# Patient Record
Sex: Male | Born: 1950 | Race: White | Hispanic: No | Smoking: Former smoker
Health system: Southern US, Community
[De-identification: ages and names within clinical notes are randomized; demographics above are authoritative.]

## PROBLEM LIST (undated history)

## (undated) DIAGNOSIS — C801 Malignant (primary) neoplasm, unspecified: Secondary | ICD-10-CM

## (undated) DIAGNOSIS — R000 Tachycardia, unspecified: Secondary | ICD-10-CM

## (undated) DIAGNOSIS — K852 Alcohol induced acute pancreatitis without necrosis or infection: Secondary | ICD-10-CM

## (undated) DIAGNOSIS — B159 Hepatitis A without hepatic coma: Secondary | ICD-10-CM

## (undated) DIAGNOSIS — M009 Pyogenic arthritis, unspecified: Secondary | ICD-10-CM

## (undated) DIAGNOSIS — N179 Acute kidney failure, unspecified: Secondary | ICD-10-CM

## (undated) DIAGNOSIS — F419 Anxiety disorder, unspecified: Secondary | ICD-10-CM

## (undated) DIAGNOSIS — M545 Low back pain, unspecified: Secondary | ICD-10-CM

## (undated) DIAGNOSIS — Z8489 Family history of other specified conditions: Secondary | ICD-10-CM

## (undated) DIAGNOSIS — I10 Essential (primary) hypertension: Secondary | ICD-10-CM

## (undated) DIAGNOSIS — F329 Major depressive disorder, single episode, unspecified: Secondary | ICD-10-CM

## (undated) DIAGNOSIS — K4090 Unilateral inguinal hernia, without obstruction or gangrene, not specified as recurrent: Secondary | ICD-10-CM

## (undated) DIAGNOSIS — K219 Gastro-esophageal reflux disease without esophagitis: Secondary | ICD-10-CM

## (undated) DIAGNOSIS — M1990 Unspecified osteoarthritis, unspecified site: Secondary | ICD-10-CM

## (undated) DIAGNOSIS — B1910 Unspecified viral hepatitis B without hepatic coma: Secondary | ICD-10-CM

## (undated) DIAGNOSIS — B1920 Unspecified viral hepatitis C without hepatic coma: Secondary | ICD-10-CM

## (undated) DIAGNOSIS — F32A Depression, unspecified: Secondary | ICD-10-CM

## (undated) DIAGNOSIS — E039 Hypothyroidism, unspecified: Secondary | ICD-10-CM

## (undated) DIAGNOSIS — R569 Unspecified convulsions: Secondary | ICD-10-CM

## (undated) DIAGNOSIS — R011 Cardiac murmur, unspecified: Secondary | ICD-10-CM

## (undated) DIAGNOSIS — G4730 Sleep apnea, unspecified: Secondary | ICD-10-CM

## (undated) DIAGNOSIS — J189 Pneumonia, unspecified organism: Secondary | ICD-10-CM

## (undated) HISTORY — PX: REFRACTIVE SURGERY: SHX103

## (undated) HISTORY — PX: OTHER SURGICAL HISTORY: SHX169

## (undated) HISTORY — DX: Essential (primary) hypertension: I10

## (undated) HISTORY — DX: Hepatitis a without hepatic coma: B15.9

## (undated) HISTORY — DX: Unspecified osteoarthritis, unspecified site: M19.90

## (undated) HISTORY — PX: ELBOW SURGERY: SHX618

## (undated) HISTORY — PX: EYE SURGERY: SHX253

## (undated) HISTORY — PX: KNEE SURGERY: SHX244

## (undated) HISTORY — PX: SHOULDER SURGERY: SHX246

---

## 2006-09-10 ENCOUNTER — Encounter: Admission: RE | Admit: 2006-09-10 | Discharge: 2006-09-10 | Payer: Self-pay | Admitting: Family Medicine

## 2007-06-13 ENCOUNTER — Encounter: Admission: RE | Admit: 2007-06-13 | Discharge: 2007-06-13 | Payer: Self-pay | Admitting: Family Medicine

## 2008-12-03 ENCOUNTER — Encounter
Admission: RE | Admit: 2008-12-03 | Discharge: 2008-12-03 | Payer: Self-pay | Admitting: Physical Medicine and Rehabilitation

## 2010-03-17 ENCOUNTER — Encounter: Payer: Self-pay | Admitting: Physical Medicine and Rehabilitation

## 2010-09-03 ENCOUNTER — Encounter (INDEPENDENT_AMBULATORY_CARE_PROVIDER_SITE_OTHER): Payer: Self-pay | Admitting: Surgery

## 2010-09-03 ENCOUNTER — Ambulatory Visit (INDEPENDENT_AMBULATORY_CARE_PROVIDER_SITE_OTHER): Payer: BC Managed Care – PPO | Admitting: Surgery

## 2010-09-03 VITALS — BP 120/78 | HR 72 | Temp 96.6°F | Ht 71.5 in | Wt 205.0 lb

## 2010-09-03 DIAGNOSIS — K648 Other hemorrhoids: Secondary | ICD-10-CM

## 2010-09-03 MED ORDER — OXYCODONE-ACETAMINOPHEN 7.5-325 MG PO TABS: 1.0000 | ORAL_TABLET | ORAL | Status: DC | PRN

## 2010-09-03 NOTE — Progress Notes (Signed)
Subjective:     Patient ID: Juan Wiggins, male   DOB: 02-Aug-1950, 60 y.o.   MRN: [id number]    BP 120/78  Pulse 72  Temp(Src) 96.6 F (35.9 C) (Temporal)  Ht 5' 11.5" (1.816 m)  Wt 205 lb (92.987 kg)  BMI 28.19 kg/m2    HPIThis patient presents to our urgent office with hemorrhoids. He was seen in an emergency department near the [facility] yesterday and told he needed an urgent hemorrhoidectomy. He lives here so came to our office for evaluation.  He notes that he had some hemorrhoid problems many many years ago but has been otherwise completely asymptomatic until yesterday. He had severe pain developed with some bleeding and prolapsing of the hemorrhoids. He has been on some Vicodin for pain which he normally takes for back issues. That really hasn't helped much. He was given some hemorrhoidal cream at the emergency department, but that hasn't helped much either.   Review of Systems  HENT: Negative.   Eyes: Negative.   Respiratory: Negative.   Cardiovascular:       Hypertension  Genitourinary: Negative.   Musculoskeletal: Positive for back pain.  Neurological: Negative.   Hematological: Negative.   Psychiatric/Behavioral: Negative.        Objective:   Physical Exam  Constitutional: He appears well-developed and well-nourished. He appears distressed.  HENT:  Head: Normocephalic and atraumatic.  Eyes: Pupils are equal, round, and reactive to light.  Neck: Neck supple. No tracheal deviation present. No thyromegaly present.  Cardiovascular: Normal rate, regular rhythm and normal heart sounds.   Pulmonary/Chest: Effort normal and breath sounds normal. No respiratory distress. He has no wheezes.  Abdominal: Soft. Bowel sounds are normal. He exhibits no distension. There is no tenderness.       Rectal examination.shows stage IV prolapse hemorrhoids. there are three significant columns of hemorrhoids One of them is becoming gangrenous  Neurological: He is alert. He has normal reflexes.    Skin: Skin is warm and dry.   No Known Allergies Current Outpatient Prescriptions  Medication Sig Dispense Refill  . ALPRAZolam (XANAX XR) 0.5 MG 24 hr tablet Take 5 mg by mouth every morning.        . escitalopram (LEXAPRO) 10 MG tablet Take 10 mg by mouth daily.        . hydrochlorothiazide (,MICROZIDE/HYDRODIURIL,) 12.5 MG capsule Take 12.5 mg by mouth daily.        Marland Kitchen HYDROcodone-acetaminophen (VICODIN) 5-500 MG per tablet Take 1 tablet by mouth as needed.        Marland Kitchen lisinopril (PRINIVIL,ZESTRIL) 5 MG tablet Take 5 mg by mouth daily.        Marland Kitchen oxyCODONE (OXYCONTIN) 15 MG TB12 Take 15 mg by mouth every 12 (twelve) hours.        . Zolpidem Tartrate (AMBIEN PO) Take by mouth Nightly.        Marland Kitchen oxyCODONE-acetaminophen (PERCOCET) 7.5-325 MG per tablet Take 1 tablet by mouth every 4 (four) hours as needed for pain.  40 tablet  0   Past Surgical History  Procedure Date  . Knee surgery   . Shoulder surgery   . Elbow surgery    Past Medical History  Diagnosis Date  . Hepatitis A   . Hypertension   . Thyroid disease   . Hemorrhoids   . Arthritis    History  Substance Use Topics  . Smoking status: Never Smoker   . Smokeless tobacco: Not on file  . Alcohol Use: Yes  3 - 4 per day       Assessment:     Acute stage IV prolapsed hemorrhoids with at least one thrombosed.    Plan:     I think he will need urgent hemorrhoidectomy and proctoscopy. I have discussed that with him. My associate, Dr. Claud Kelp also saw him. He agrees . We will add him on tomorrow's operative schedule for Dr. Derrell Lolling to perform proctoscopy and hemorrhoidectomy. I gave him a prescription for Percocet 7/325 #40

## 2010-09-04 ENCOUNTER — Ambulatory Visit (HOSPITAL_COMMUNITY): Payer: BC Managed Care – PPO

## 2010-09-04 ENCOUNTER — Other Ambulatory Visit (INDEPENDENT_AMBULATORY_CARE_PROVIDER_SITE_OTHER): Payer: Self-pay | Admitting: General Surgery

## 2010-09-04 ENCOUNTER — Ambulatory Visit (HOSPITAL_COMMUNITY)
Admission: RE | Admit: 2010-09-04 | Discharge: 2010-09-05 | Disposition: A | Discharge status: Home / Self Care | Payer: BC Managed Care – PPO | Source: Ambulatory Visit | Attending: General Surgery | Admitting: General Surgery

## 2010-09-04 DIAGNOSIS — K649 Unspecified hemorrhoids: Secondary | ICD-10-CM

## 2010-09-04 DIAGNOSIS — Z01810 Encounter for preprocedural cardiovascular examination: Secondary | ICD-10-CM | POA: Insufficient documentation

## 2010-09-04 DIAGNOSIS — I10 Essential (primary) hypertension: Secondary | ICD-10-CM | POA: Insufficient documentation

## 2010-09-04 DIAGNOSIS — K645 Perianal venous thrombosis: Secondary | ICD-10-CM | POA: Insufficient documentation

## 2010-09-04 DIAGNOSIS — Z01818 Encounter for other preprocedural examination: Secondary | ICD-10-CM | POA: Insufficient documentation

## 2010-09-04 DIAGNOSIS — Z01812 Encounter for preprocedural laboratory examination: Secondary | ICD-10-CM | POA: Insufficient documentation

## 2010-09-07 NOTE — Op Note (Signed)
NAME:  Juan Wiggins, Juan Wiggins NO.:  [id number]  MEDICAL RECORD NO.:  [id number]  LOCATION:  5120                         FACILITY:  [facility]  PHYSICIAN:  Angelia Mould. Derrell Lolling, M.D.DATE OF BIRTH:  [birth year]  DATE OF PROCEDURE:  09/04/2010 DATE OF DISCHARGE:                              OPERATIVE REPORT   PREOPERATIVE DIAGNOSIS:  Stage IV thrombosed internal and external hemorrhoids.  POSTOPERATIVE DIAGNOSIS:  Stage IV thrombosed internal and external hemorrhoids.  OPERATION PERFORMED: 1. Rigid proctoscopy. 2. Internal and external hemorrhoidectomy, complex, right posterior,     single column.  SURGEON:  Angelia Mould. Derrell Lolling, MD  OPERATIVE INDICATIONS:  This is a 60 year old Caucasian man who has had hemorrhoids off and on for 30 years.  He was seen in our urgent office by Dr. Cyndia Bent yesterday with swollen, prolapsed, and nonreducible hemorrhoids.  Dr. Jamey Ripa asked me to assume his care and proceed with surgery today.  I did briefly examine him in the office yesterday and found that he had huge prolapsed, nonreducible, and thrombosed internal/external hemorrhoids.  He was too tender for any kind of internal exam.  He is brought to the operating room urgently for examination and excision.  He states that he has had one colonoscopy in the past which was normal.  OPERATIVE TECHNIQUE:  Following the induction of general endotracheal anesthesia, the patient was placed in rigid stirrups in lithotomy position.    Examination revealed huge internal and external hemorrhoids which were thrombosed in the right posterior position.  There were minimal hemorrhoids elsewhere and so, I decided to simply excise the right posterior column.  Rigid proctoscopy was carried out.  The prep was not completely normal, but was fairly good.  I could get the proctoscope in up to 20 cm and saw no tumors.  There were several spots of stool, so I could have missed a small polyp, but no  large masses.  The perineal and perianal areas were then prepped and draped in a sterile fashion.  Surgical time-out was held, identifying correct patient and correct procedure.  I injected the perianal sphincter muscles with about 25 mL of 0.5% Marcaine with epinephrine.  I then mixed 1 mL of Wydase with 9 mL of 0.5 Marcaine with epinephrine and I injected the right posterior hemorrhoidal column.  This was massaged for 2 or 3 minutes.  The operating anoscope was then inserted.  Using a Pennington retractor, I grabbed the external component and this provided good exposure.  A figure-of-eight transfixion suture of 2-0 chromic was placed in the rectal mucosa in the right posterior position above the hemorrhoidal pile.  I then completely excised the internal and external component.  I very conservatively excised the mucosa and anoderm and used cautery and scissor dissection to excise the hemorrhoidal plexus being careful not to injure the internal sphincter muscle.  Specimen was sent to pathology.  Hemostasis was excellent and achieved electrocautery.  The wound was closed with a running locking suture of 2-0 chromic.  There was pressure held on this and simply observed for about 5 minutes and there was no bleeding or swelling.  I placed dibucaine ointment on the suture line.  External dry bandages were placed and fishnet panties were placed.  The patient tolerated the procedure well and was taken to the recovery room in stable condition.  Estimated blood loss was about 20 mL or less.  Complications none.  Sponge, needle, and instrument counts were correct.     Angelia Mould. Derrell Lolling, M.D.     HMI/MEDQ  D:  09/04/2010  T:  09/04/2010  Job:  [id number]  cc:   Joycelyn Rua, M.D.  Electronically Signed by Claud Kelp M.D. on 09/07/2010 09:51:43 PM

## 2010-09-09 ENCOUNTER — Telehealth (INDEPENDENT_AMBULATORY_CARE_PROVIDER_SITE_OTHER): Payer: Self-pay | Admitting: General Surgery

## 2010-09-10 LAB — POCT I-STAT 4, (NA,K, GLUC, HGB,HCT)
Hemoglobin: 16 g/dL (ref 13.0–17.0)
Potassium: 4.2 mEq/L (ref 3.5–5.1)

## 2010-10-06 ENCOUNTER — Encounter (INDEPENDENT_AMBULATORY_CARE_PROVIDER_SITE_OTHER): Payer: BC Managed Care – PPO | Admitting: General Surgery

## 2010-12-30 ENCOUNTER — Other Ambulatory Visit: Payer: Self-pay | Admitting: Family Medicine

## 2010-12-30 DIAGNOSIS — M542 Cervicalgia: Secondary | ICD-10-CM

## 2011-01-01 ENCOUNTER — Other Ambulatory Visit: Payer: Self-pay

## 2011-01-02 ENCOUNTER — Ambulatory Visit
Admission: RE | Admit: 2011-01-02 | Discharge: 2011-01-02 | Disposition: A | Discharge status: Home / Self Care | Payer: BC Managed Care – PPO | Source: Ambulatory Visit | Attending: Family Medicine | Admitting: Family Medicine

## 2011-01-02 DIAGNOSIS — M542 Cervicalgia: Secondary | ICD-10-CM

## 2011-01-05 ENCOUNTER — Encounter (INDEPENDENT_AMBULATORY_CARE_PROVIDER_SITE_OTHER): Payer: Self-pay | Admitting: General Surgery

## 2011-01-31 ENCOUNTER — Inpatient Hospital Stay: Admission: RE | Admit: 2011-01-31 | Payer: Self-pay | Source: Ambulatory Visit

## 2011-02-12 ENCOUNTER — Ambulatory Visit
Admission: RE | Admit: 2011-02-12 | Discharge: 2011-02-12 | Disposition: A | Discharge status: Home / Self Care | Payer: BC Managed Care – PPO | Source: Ambulatory Visit | Attending: Family Medicine | Admitting: Family Medicine

## 2011-02-12 ENCOUNTER — Other Ambulatory Visit: Payer: Self-pay | Admitting: Family Medicine

## 2011-02-12 DIAGNOSIS — R52 Pain, unspecified: Secondary | ICD-10-CM

## 2011-03-30 ENCOUNTER — Other Ambulatory Visit: Payer: Self-pay | Admitting: Neurosurgery

## 2011-03-30 ENCOUNTER — Other Ambulatory Visit (HOSPITAL_COMMUNITY): Payer: Self-pay | Admitting: Neurosurgery

## 2011-03-30 DIAGNOSIS — M542 Cervicalgia: Secondary | ICD-10-CM

## 2011-03-30 DIAGNOSIS — M545 Low back pain: Secondary | ICD-10-CM

## 2011-04-08 ENCOUNTER — Other Ambulatory Visit: Payer: Self-pay | Admitting: Neurosurgery

## 2011-04-09 ENCOUNTER — Inpatient Hospital Stay (HOSPITAL_COMMUNITY): Admission: RE | Admit: 2011-04-09 | Payer: Self-pay | Source: Ambulatory Visit

## 2011-04-09 ENCOUNTER — Ambulatory Visit (HOSPITAL_COMMUNITY): Admission: RE | Admit: 2011-04-09 | Payer: BC Managed Care – PPO | Source: Ambulatory Visit

## 2011-04-09 ENCOUNTER — Other Ambulatory Visit (HOSPITAL_COMMUNITY): Payer: Self-pay

## 2011-04-12 ENCOUNTER — Encounter (HOSPITAL_BASED_OUTPATIENT_CLINIC_OR_DEPARTMENT_OTHER): Payer: Self-pay | Admitting: *Deleted

## 2011-04-12 ENCOUNTER — Emergency Department (HOSPITAL_BASED_OUTPATIENT_CLINIC_OR_DEPARTMENT_OTHER): Payer: BC Managed Care – PPO

## 2011-04-12 ENCOUNTER — Other Ambulatory Visit: Payer: Self-pay

## 2011-04-12 ENCOUNTER — Emergency Department (INDEPENDENT_AMBULATORY_CARE_PROVIDER_SITE_OTHER): Payer: BC Managed Care – PPO

## 2011-04-12 ENCOUNTER — Observation Stay (HOSPITAL_BASED_OUTPATIENT_CLINIC_OR_DEPARTMENT_OTHER)
Admission: EM | Admit: 2011-04-12 | Discharge: 2011-04-15 | Disposition: A | Discharge status: Home / Self Care | Payer: BC Managed Care – PPO | Attending: Internal Medicine | Admitting: Internal Medicine

## 2011-04-12 DIAGNOSIS — F1010 Alcohol abuse, uncomplicated: Secondary | ICD-10-CM | POA: Diagnosis present

## 2011-04-12 DIAGNOSIS — E039 Hypothyroidism, unspecified: Secondary | ICD-10-CM | POA: Diagnosis present

## 2011-04-12 DIAGNOSIS — N179 Acute kidney failure, unspecified: Secondary | ICD-10-CM | POA: Insufficient documentation

## 2011-04-12 DIAGNOSIS — J449 Chronic obstructive pulmonary disease, unspecified: Secondary | ICD-10-CM | POA: Insufficient documentation

## 2011-04-12 DIAGNOSIS — M6281 Muscle weakness (generalized): Secondary | ICD-10-CM | POA: Insufficient documentation

## 2011-04-12 DIAGNOSIS — I959 Hypotension, unspecified: Principal | ICD-10-CM | POA: Insufficient documentation

## 2011-04-12 DIAGNOSIS — B159 Hepatitis A without hepatic coma: Secondary | ICD-10-CM | POA: Insufficient documentation

## 2011-04-12 DIAGNOSIS — J4489 Other specified chronic obstructive pulmonary disease: Secondary | ICD-10-CM | POA: Insufficient documentation

## 2011-04-12 DIAGNOSIS — R0602 Shortness of breath: Secondary | ICD-10-CM | POA: Insufficient documentation

## 2011-04-12 DIAGNOSIS — R296 Repeated falls: Secondary | ICD-10-CM

## 2011-04-12 DIAGNOSIS — I10 Essential (primary) hypertension: Secondary | ICD-10-CM

## 2011-04-12 HISTORY — DX: Hypothyroidism, unspecified: E03.9

## 2011-04-12 LAB — HEPATIC FUNCTION PANEL
AST: 15 U/L (ref 0–37)
Albumin: 3.3 g/dL — ABNORMAL LOW (ref 3.5–5.2)

## 2011-04-12 LAB — CBC
HCT: 34.5 % — ABNORMAL LOW (ref 39.0–52.0)
Hemoglobin: 12 g/dL — ABNORMAL LOW (ref 13.0–17.0)
MCH: 30.5 pg (ref 26.0–34.0)
MCHC: 34.8 g/dL (ref 30.0–36.0)

## 2011-04-12 LAB — BASIC METABOLIC PANEL
CO2: 26 mEq/L (ref 19–32)
GFR calc non Af Amer: 27 mL/min — ABNORMAL LOW (ref >90)
Glucose, Bld: 94 mg/dL (ref 70–99)
Potassium: 4.2 mEq/L (ref 3.5–5.1)
Sodium: 134 mEq/L — ABNORMAL LOW (ref 135–145)

## 2011-04-12 LAB — DIFFERENTIAL
Basophils Relative: 0 % (ref 0–1)
Eosinophils Absolute: 0.5 10*3/uL (ref 0.0–0.7)
Eosinophils Relative: 4 % (ref 0–5)
Monocytes Absolute: 1.5 10*3/uL — ABNORMAL HIGH (ref 0.1–1.0)
Monocytes Relative: 13 % — ABNORMAL HIGH (ref 3–12)

## 2011-04-12 LAB — RAPID URINE DRUG SCREEN, HOSP PERFORMED
Barbiturates: NOT DETECTED
Tetrahydrocannabinol: NOT DETECTED

## 2011-04-12 MED ORDER — SODIUM CHLORIDE 0.9 % IV SOLN
INTRAVENOUS | Status: DC
  Administered 2011-04-12: 21:00:00 via INTRAVENOUS

## 2011-04-12 MED ORDER — SODIUM CHLORIDE 0.9 % IV BOLUS (SEPSIS)
2000.0000 mL | Freq: Once | INTRAVENOUS | Status: AC
  Administered 2011-04-12: 2000 mL via INTRAVENOUS

## 2011-04-12 NOTE — ED Provider Notes (Cosign Needed)
History     CSN: [id number]  Arrival date & time 04/12/11  1478   First MD Initiated Contact with Patient 04/12/11 2057      Chief Complaint  Patient presents with  . Hypotension    (Consider location/radiation/quality/duration/timing/severity/associated sxs/prior treatment) HPI Complains of generalized weakness lightheadedness and shortness of breath onset one or 2 minutes after taking out the trash 6 PM tonight. No treatment prior to coming here no chest pain no bowel pain no headache no fever no other associated symptoms treated by EMS with intravenous fluids partial relief presently still complains name of generalized weakness. No other associated symptoms. Family member reported at home at blood pressure taken was approximately 60 systolic. Past Medical History  Diagnosis Date  . Hepatitis A   . Hypertension   . Thyroid disease   . Hemorrhoids   . Arthritis    Alcoholic Past Surgical History  Procedure Date  . Knee surgery   . Shoulder surgery   . Elbow surgery     History reviewed. No pertinent family history.  History  Substance Use Topics  . Smoking status: Never Smoker   . Smokeless tobacco: Not on file  . Alcohol Use: Yes     3 - 4 per day   social history cigar smoker, heavy drinker, no illicit drug use    Review of Systems  Constitutional: Positive for fatigue.  HENT: Negative.   Respiratory: Positive for shortness of breath.   Cardiovascular: Negative.   Gastrointestinal: Negative.   Musculoskeletal: Negative.   Skin: Negative.   Neurological: Negative.   Hematological: Negative.   Psychiatric/Behavioral: Negative.   All other systems reviewed and are negative.    Allergies  Review of patient's allergies indicates no known allergies.  Home Medications   Current Outpatient Rx  Name Route Sig Dispense Refill  . ALPRAZOLAM ER 0.5 MG PO TB24 Oral Take 5 mg by mouth every morning.      Marland Kitchen ESCITALOPRAM OXALATE 10 MG PO TABS Oral Take 10 mg by  mouth daily.      Marland Kitchen GABAPENTIN 300 MG PO CAPS Oral Take 300 mg by mouth 3 (three) times daily.    Marland Kitchen HYDROCHLOROTHIAZIDE 25 MG PO TABS Oral Take 25 mg by mouth daily.    Marland Kitchen HYDROCODONE-ACETAMINOPHEN 5-500 MG PO TABS Oral Take 1 tablet by mouth every 4 (four) hours as needed. For pain    . LANSOPRAZOLE 15 MG PO CPDR Oral Take 15 mg by mouth daily.    Marland Kitchen LEVOTHYROXINE SODIUM 150 MCG PO TABS Oral Take 150 mcg by mouth daily.    Marland Kitchen LISINOPRIL 20 MG PO TABS Oral Take 20 mg by mouth daily.    Marland Kitchen NAPROXEN 500 MG PO TABS Oral Take 500 mg by mouth 2 (two) times daily with a meal.    . OXYCODONE HCL ER 15 MG PO TB12 Oral Take 15 mg by mouth every 6 (six) hours as needed. For pain    . PROMETHAZINE HCL 25 MG PO TABS Oral Take 25 mg by mouth every 6 (six) hours as needed. For nausea    . TESTOSTERONE CYPIONATE 200 MG/ML IM OIL Intramuscular Inject 800 mg into the muscle every 30 (thirty) days.    Marland Kitchen ZOLPIDEM TARTRATE 10 MG PO TABS Oral Take 10 mg by mouth at bedtime.      BP 102/60  Pulse 84  Temp(Src) 97.9 F (36.6 C) (Oral)  Resp 20  Ht 5\' 9"  (1.753 m)  Wt 180 lb (81.647  kg)  BMI 26.58 kg/m2  SpO2 98%  Physical Exam  Nursing note and vitals reviewed. Constitutional: He appears well-developed and well-nourished.  HENT:  Head: Normocephalic and atraumatic.  Eyes: Conjunctivae are normal. Pupils are equal, round, and reactive to light.  Neck: Neck supple. No tracheal deviation present. No thyromegaly present.  Cardiovascular: Normal rate and regular rhythm.   No murmur heard. Pulmonary/Chest: Effort normal and breath sounds normal.  Abdominal: Soft. Bowel sounds are normal. He exhibits no distension. There is no tenderness.  Musculoskeletal: Normal range of motion. He exhibits no edema and no tenderness.  Neurological: He is alert. Coordination normal.  Skin: Skin is warm and dry. No rash noted.  Psychiatric: He has a normal mood and affect.    ED Course  Procedures (including critical care  time) 12 midnight feels improved patient alert awake hemodynamically stable Labs Reviewed  CBC - Abnormal; Notable for the following:    WBC 11.4 (*)    RBC 3.93 (*)    Hemoglobin 12.0 (*)    HCT 34.5 (*)    All other components within normal limits  DIFFERENTIAL - Abnormal; Notable for the following:    Neutro Abs 8.1 (*)    Lymphocytes Relative 11 (*)    Monocytes Relative 13 (*)    Monocytes Absolute 1.5 (*)    All other components within normal limits  BASIC METABOLIC PANEL  ETHANOL   Date: 04/12/2011  Rate: 90  Rhythm: normal sinus rhythm  QRS Axis: normal  Intervals: normal  ST/T Wave abnormalities: normal and nonspecific ST/T changes  Conduction Disutrbances:none  Narrative Interpretation:   Old EKG Reviewed: none available  No results found.   No diagnosis found.    MDM  Hypotension may be due to polypharmacy i.e. benzodiazepines, narcotics, and alcohol Angina equivalent with dyspnea and hypotension after exerting himself by taking out trash is also a remote possibility. Patient to be hospitalized as 23 hour observation to regulate medications. Spoke with Dr.Kakrakandy who accepts patient in transfer Plan telemetry Transfer to [facility] Note patient should be watched for withdrawal from alcohol, narcotics and or benzodiazepines while in the hospital Diagnoses #1 hypotension #2 dyspnea #3 alcohol abuse #4 renal insufficiency        Doug Sou, MD 04/13/11 1610  Doug Sou, MD 04/13/11 0030

## 2011-04-12 NOTE — ED Notes (Signed)
I placed a call to [facility](via answering service line) at [facility] for consult per Dr. Ethelda Chick

## 2011-04-12 NOTE — ED Notes (Signed)
Pt reported + ETOH today of about "3-4 scotches"  Pt will arouse to answer questions and promptly falls back to sleep.  Pt family at bedside.

## 2011-04-12 NOTE — ED Notes (Signed)
Patient uses urinal without difficulty and with no assistance

## 2011-04-12 NOTE — ED Notes (Signed)
Pt presents to ED via [facility] for decreased BP at home.  Per EMS, pt BP enroute 88/58.  Pt in no visible signs of distress.

## 2011-04-13 ENCOUNTER — Encounter (HOSPITAL_COMMUNITY): Payer: Self-pay | Admitting: Internal Medicine

## 2011-04-13 DIAGNOSIS — I959 Hypotension, unspecified: Secondary | ICD-10-CM

## 2011-04-13 DIAGNOSIS — E039 Hypothyroidism, unspecified: Secondary | ICD-10-CM | POA: Diagnosis present

## 2011-04-13 DIAGNOSIS — N179 Acute kidney failure, unspecified: Secondary | ICD-10-CM | POA: Diagnosis present

## 2011-04-13 DIAGNOSIS — F1010 Alcohol abuse, uncomplicated: Secondary | ICD-10-CM | POA: Diagnosis present

## 2011-04-13 LAB — MRSA PCR SCREENING: MRSA by PCR: NEGATIVE

## 2011-04-13 LAB — URINALYSIS, ROUTINE W REFLEX MICROSCOPIC
Bilirubin Urine: NEGATIVE
Leukocytes, UA: NEGATIVE
Nitrite: NEGATIVE
Specific Gravity, Urine: 1.009 (ref 1.005–1.030)
pH: 6 (ref 5.0–8.0)

## 2011-04-13 LAB — COMPREHENSIVE METABOLIC PANEL
Albumin: 2.9 g/dL — ABNORMAL LOW (ref 3.5–5.2)
BUN: 18 mg/dL (ref 6–23)
Chloride: 102 mEq/L (ref 96–112)
Creatinine, Ser: 2.24 mg/dL — ABNORMAL HIGH (ref 0.50–1.35)
GFR calc Af Amer: 35 mL/min — ABNORMAL LOW (ref >90)
Total Bilirubin: 0.3 mg/dL (ref 0.3–1.2)

## 2011-04-13 LAB — CARDIAC PANEL(CRET KIN+CKTOT+MB+TROPI)
CK, MB: 2.1 ng/mL (ref 0.3–4.0)
Relative Index: INVALID (ref 0.0–2.5)
Relative Index: INVALID (ref 0.0–2.5)
Total CK: 49 U/L (ref 7–232)
Troponin I: <0.3 ng/mL (ref <0.30)
Troponin I: <0.3 ng/mL (ref <0.30)
Troponin I: <0.3 ng/mL (ref <0.30)

## 2011-04-13 LAB — CBC
MCV: 87.6 fL (ref 78.0–100.0)
Platelets: 339 10*3/uL (ref 150–400)
RBC: 3.79 MIL/uL — ABNORMAL LOW (ref 4.22–5.81)
WBC: 7.6 10*3/uL (ref 4.0–10.5)

## 2011-04-13 MED ORDER — ALPRAZOLAM ER 1 MG PO TB24: 5.0000 mg | ORAL_TABLET | ORAL | Status: DC

## 2011-04-13 MED ORDER — VITAMIN B-1 100 MG PO TABS
100.0000 mg | ORAL_TABLET | Freq: Every day | ORAL | Status: DC
  Administered 2011-04-13 – 2011-04-15 (×3): 100 mg via ORAL
  Filled 2011-04-13 (×3): qty 1

## 2011-04-13 MED ORDER — LORAZEPAM 0.5 MG PO TABS: 1.0000 mg | ORAL_TABLET | Freq: Four times a day (QID) | ORAL | Status: DC | PRN

## 2011-04-13 MED ORDER — LORAZEPAM 2 MG/ML IJ SOLN
1.0000 mg | Freq: Four times a day (QID) | INTRAMUSCULAR | Status: DC | PRN
  Administered 2011-04-13: 1 mg via INTRAVENOUS
  Filled 2011-04-13: qty 1

## 2011-04-13 MED ORDER — THIAMINE HCL 100 MG/ML IJ SOLN
100.0000 mg | Freq: Every day | INTRAMUSCULAR | Status: DC
  Filled 2011-04-13: qty 1

## 2011-04-13 MED ORDER — ADULT MULTIVITAMIN W/MINERALS CH
1.0000 | ORAL_TABLET | Freq: Every day | ORAL | Status: DC
  Filled 2011-04-13: qty 1

## 2011-04-13 MED ORDER — LEVOTHYROXINE SODIUM 150 MCG PO TABS
150.0000 ug | ORAL_TABLET | Freq: Every day | ORAL | Status: DC
  Administered 2011-04-13 – 2011-04-15 (×3): 150 ug via ORAL
  Filled 2011-04-13 (×3): qty 1

## 2011-04-13 MED ORDER — OXYCODONE HCL 15 MG PO TB12
15.0000 mg | ORAL_TABLET | Freq: Four times a day (QID) | ORAL | Status: DC | PRN
  Administered 2011-04-13 – 2011-04-15 (×6): 15 mg via ORAL
  Filled 2011-04-13 (×6): qty 1

## 2011-04-13 MED ORDER — SODIUM CHLORIDE 0.9 % IV SOLN
INTRAVENOUS | Status: DC
  Administered 2011-04-13 – 2011-04-15 (×5): via INTRAVENOUS

## 2011-04-13 MED ORDER — PANTOPRAZOLE SODIUM 40 MG PO TBEC
40.0000 mg | DELAYED_RELEASE_TABLET | Freq: Every day | ORAL | Status: DC
  Administered 2011-04-13 – 2011-04-15 (×3): 40 mg via ORAL
  Filled 2011-04-13 (×3): qty 1

## 2011-04-13 MED ORDER — LORAZEPAM 0.5 MG PO TABS
0.0000 mg | ORAL_TABLET | Freq: Four times a day (QID) | ORAL | Status: AC
  Administered 2011-04-14: 1 mg via ORAL
  Administered 2011-04-14 – 2011-04-15 (×2): 0.5 mg via ORAL
  Filled 2011-04-13: qty 2
  Filled 2011-04-13 (×2): qty 1

## 2011-04-13 MED ORDER — LORAZEPAM 2 MG/ML IJ SOLN
1.0000 mg | Freq: Four times a day (QID) | INTRAMUSCULAR | Status: DC | PRN
  Administered 2011-04-13: 1 mg via INTRAVENOUS
  Filled 2011-04-13 (×2): qty 1

## 2011-04-13 MED ORDER — ADULT MULTIVITAMIN W/MINERALS CH
1.0000 | ORAL_TABLET | Freq: Every day | ORAL | Status: DC
  Administered 2011-04-13 – 2011-04-15 (×3): 1 via ORAL
  Filled 2011-04-13 (×3): qty 1

## 2011-04-13 MED ORDER — GABAPENTIN 300 MG PO CAPS
300.0000 mg | ORAL_CAPSULE | Freq: Three times a day (TID) | ORAL | Status: DC
  Administered 2011-04-13 – 2011-04-15 (×7): 300 mg via ORAL
  Filled 2011-04-13 (×9): qty 1

## 2011-04-13 MED ORDER — VITAMIN B-1 100 MG PO TABS
100.0000 mg | ORAL_TABLET | Freq: Every day | ORAL | Status: DC
  Filled 2011-04-13: qty 1

## 2011-04-13 MED ORDER — THIAMINE HCL 100 MG/ML IJ SOLN
100.0000 mg | Freq: Every day | INTRAMUSCULAR | Status: DC
  Filled 2011-04-13 (×3): qty 1

## 2011-04-13 MED ORDER — ZOLPIDEM TARTRATE 5 MG PO TABS
10.0000 mg | ORAL_TABLET | Freq: Every day | ORAL | Status: DC
  Administered 2011-04-13 – 2011-04-14 (×2): 10 mg via ORAL
  Filled 2011-04-13 (×2): qty 2

## 2011-04-13 MED ORDER — FOLIC ACID 1 MG PO TABS
1.0000 mg | ORAL_TABLET | Freq: Every day | ORAL | Status: DC
  Administered 2011-04-13 – 2011-04-15 (×3): 1 mg via ORAL
  Filled 2011-04-13 (×3): qty 1

## 2011-04-13 MED ORDER — LORAZEPAM 0.5 MG PO TABS
0.0000 mg | ORAL_TABLET | Freq: Two times a day (BID) | ORAL | Status: DC
  Administered 2011-04-15: 0.5 mg via ORAL
  Filled 2011-04-13: qty 1

## 2011-04-13 MED ORDER — SODIUM CHLORIDE 0.9 % IV SOLN: INTRAVENOUS | Status: DC

## 2011-04-13 MED ORDER — ESCITALOPRAM OXALATE 10 MG PO TABS
10.0000 mg | ORAL_TABLET | Freq: Every day | ORAL | Status: DC
  Administered 2011-04-13 – 2011-04-15 (×3): 10 mg via ORAL
  Filled 2011-04-13 (×3): qty 1

## 2011-04-13 MED ORDER — HYDROCODONE-ACETAMINOPHEN 5-325 MG PO TABS
1.0000 | ORAL_TABLET | ORAL | Status: DC | PRN
  Administered 2011-04-15 (×2): 1 via ORAL
  Filled 2011-04-13 (×2): qty 1

## 2011-04-13 MED ORDER — FOLIC ACID 1 MG PO TABS
1.0000 mg | ORAL_TABLET | Freq: Every day | ORAL | Status: DC
  Filled 2011-04-13: qty 1

## 2011-04-13 MED ORDER — ONDANSETRON HCL 4 MG/2ML IJ SOLN: 4.0000 mg | Freq: Four times a day (QID) | INTRAMUSCULAR | Status: DC | PRN

## 2011-04-13 NOTE — Progress Notes (Signed)
Pt had orders that were placed prior to this admission(2/4) waiting to be released.  The orders did not seem to be for this pt.  Dr. Franky Macho was notified and did not know about the orders.  Dr. Ardyth Harps was notified as well, she also thought the orders were not for this pt.  The orders were released and DC.

## 2011-04-13 NOTE — H&P (Signed)
Juan Wiggins is an 61 y.o. male. PCP - Dr.Stephen Izola Price.   Chief Complaint: Weakness. HPI: 61 year-old male with history of hypothyroidism alcoholism and hypertension suddenly felt weak at home. His girlfriend checked his blood pressure and was found to be low. They called EMS. His blood pressure systolic was found around 70. Given normal saline bolus and taken to the ER med center [facility]. There his the pressure was still in the lower side but improved with fluids. In addition his creatinine was found to be 2.5. We don't have a baseline creatinine levels on him. At this time patient has been admitted for further management. Patient denies any nausea vomiting abdominal pain dysuria or discharges or diarrhea. Patient denies any chest pain shortness of breath or any focal deficits.  Past Medical History  Diagnosis Date  . Hepatitis A   . Hypertension   . Thyroid disease   . Hemorrhoids   . Arthritis   . Hypothyroidism     Past Surgical History  Procedure Date  . Knee surgery   . Shoulder surgery   . Elbow surgery     History reviewed. No pertinent family history. Social History:  reports that he has never smoked. He does not have any smokeless tobacco history on file. He reports that he drinks alcohol. He reports that he does not use illicit drugs.  Allergies: No Known Allergies  Medications Prior to Admission  Medication Dose Route Frequency Provider Last Rate Last Dose  . 0.9 %  sodium chloride infusion   Intravenous Continuous Eduard Clos, MD      . ALPRAZolam (XANAX XR) 24 hr tablet 5 mg  5 mg Oral Scharlene Gloss, MD      . escitalopram (LEXAPRO) tablet 10 mg  10 mg Oral Daily Eduard Clos, MD      . folic acid (FOLVITE) tablet 1 mg  1 mg Oral Daily Eduard Clos, MD      . gabapentin (NEURONTIN) capsule 300 mg  300 mg Oral TID Eduard Clos, MD      . HYDROcodone-acetaminophen (NORCO) 5-325 MG per tablet 1 tablet  1 tablet Oral Q4H  PRN Eduard Clos, MD      . levothyroxine (SYNTHROID, LEVOTHROID) tablet 150 mcg  150 mcg Oral Daily Eduard Clos, MD      . LORazepam (ATIVAN) tablet 1 mg  1 mg Oral Q6H PRN Eduard Clos, MD       Or  . LORazepam (ATIVAN) injection 1 mg  1 mg Intravenous Q6H PRN Eduard Clos, MD      . LORazepam (ATIVAN) tablet 0-4 mg  0-4 mg Oral Q6H Eduard Clos, MD       Followed by  . LORazepam (ATIVAN) tablet 0-4 mg  0-4 mg Oral Q12H Eduard Clos, MD      . mulitivitamin with minerals tablet 1 tablet  1 tablet Oral Daily Eduard Clos, MD      . ondansetron [facility]) injection 4 mg  4 mg Intravenous Q6H PRN Eduard Clos, MD      . oxyCODONE (OXYCONTIN) 12 hr tablet 15 mg  15 mg Oral Q6H PRN Eduard Clos, MD      . pantoprazole (PROTONIX) EC tablet 40 mg  40 mg Oral Daily Eduard Clos, MD      . sodium chloride 0.9 % bolus 2,000 mL  2,000 mL Intravenous Once Doug Sou, MD   2,000  mL at 04/12/11 2343  . thiamine (VITAMIN B-1) tablet 100 mg  100 mg Oral Daily Eduard Clos, MD       Or  . thiamine (B-1) injection 100 mg  100 mg Intravenous Daily Eduard Clos, MD      . zolpidem (AMBIEN) tablet 10 mg  10 mg Oral QHS Eduard Clos, MD      . DISCONTD: 0.9 %  sodium chloride infusion   Intravenous Continuous Doug Sou, MD      . DISCONTD: 0.9 %  sodium chloride infusion   Intravenous STAT Doug Sou, MD      . DISCONTD: folic acid (FOLVITE) tablet 1 mg  1 mg Oral Daily Doug Sou, MD      . DISCONTD: LORazepam (ATIVAN) injection 1 mg  1 mg Intravenous Q6H PRN Doug Sou, MD   1 mg at 04/13/11 0208  . DISCONTD: LORazepam (ATIVAN) tablet 1 mg  1 mg Oral Q6H PRN Doug Sou, MD      . DISCONTD: mulitivitamin with minerals tablet 1 tablet  1 tablet Oral Daily Doug Sou, MD      . DISCONTD: thiamine (B-1) injection 100 mg  100 mg Intravenous Daily Doug Sou, MD      . DISCONTD:  thiamine (VITAMIN B-1) tablet 100 mg  100 mg Oral Daily Doug Sou, MD       Medications Prior to Admission  Medication Sig Dispense Refill  . ALPRAZolam (XANAX XR) 0.5 MG 24 hr tablet Take 5 mg by mouth every morning.        . escitalopram (LEXAPRO) 10 MG tablet Take 10 mg by mouth daily.        Marland Kitchen HYDROcodone-acetaminophen (VICODIN) 5-500 MG per tablet Take 1 tablet by mouth every 4 (four) hours as needed. For pain      . oxyCODONE (OXYCONTIN) 15 MG TB12 Take 15 mg by mouth every 6 (six) hours as needed. For pain        Results for orders placed during the hospital encounter of 04/12/11 (from the past 48 hour(s))  CBC     Status: Abnormal   Collection Time   04/12/11  8:39 PM      Component Value Range Comment   WBC 11.4 (*) 4.0 - 10.5 (K/uL)    RBC 3.93 (*) 4.22 - 5.81 (MIL/uL)    Hemoglobin 12.0 (*) 13.0 - 17.0 (g/dL)    HCT 40.9 (*) 81.1 - 52.0 (%)    MCV 87.8  78.0 - 100.0 (fL)    MCH 30.5  26.0 - 34.0 (pg)    MCHC 34.8  30.0 - 36.0 (g/dL)    RDW 91.4  78.2 - 95.6 (%)    Platelets 364  150 - 400 (K/uL)   DIFFERENTIAL     Status: Abnormal   Collection Time   04/12/11  8:39 PM      Component Value Range Comment   Neutrophils Relative 72  43 - 77 (%)    Neutro Abs 8.1 (*) 1.7 - 7.7 (K/uL)    Lymphocytes Relative 11 (*) 12 - 46 (%)    Lymphs Abs 1.2  0.7 - 4.0 (K/uL)    Monocytes Relative 13 (*) 3 - 12 (%)    Monocytes Absolute 1.5 (*) 0.1 - 1.0 (K/uL)    Eosinophils Relative 4  0 - 5 (%)    Eosinophils Absolute 0.5  0.0 - 0.7 (K/uL)    Basophils Relative 0  0 - 1 (%)  Basophils Absolute 0.0  0.0 - 0.1 (K/uL)   TROPONIN I     Status: Normal   Collection Time   04/12/11  9:11 PM      Component Value Range Comment   Troponin I <0.30  <0.30 (ng/mL)   HEPATIC FUNCTION PANEL     Status: Abnormal   Collection Time   04/12/11  9:11 PM      Component Value Range Comment   Total Protein 6.5  6.0 - 8.3 (g/dL)    Albumin 3.3 (*) 3.5 - 5.2 (g/dL)    AST 15  0 - 37 (U/L)     ALT 11  0 - 53 (U/L)    Alkaline Phosphatase 71  39 - 117 (U/L)    Total Bilirubin 0.1 (*) 0.3 - 1.2 (mg/dL)    Bilirubin, Direct <1.6  0.0 - 0.3 (mg/dL)    Indirect Bilirubin NOT CALCULATED  0.3 - 0.9 (mg/dL)   ETHANOL     Status: Abnormal   Collection Time   04/12/11  9:11 PM      Component Value Range Comment   Alcohol, Ethyl (B) 31 (*) 0 - 11 (mg/dL)   BASIC METABOLIC PANEL     Status: Abnormal   Collection Time   04/12/11  9:11 PM      Component Value Range Comment   Sodium 134 (*) 135 - 145 (mEq/L)    Potassium 4.2  3.5 - 5.1 (mEq/L)    Chloride 97  96 - 112 (mEq/L)    CO2 26  19 - 32 (mEq/L)    Glucose, Bld 94  70 - 99 (mg/dL)    BUN 17  6 - 23 (mg/dL)    Creatinine, Ser 1.09 (*) 0.50 - 1.35 (mg/dL)    Calcium 9.2  8.4 - 10.5 (mg/dL)    GFR calc non Af Amer 27 (*) >90 (mL/min)    GFR calc Af Amer 31 (*) >90 (mL/min)   URINE RAPID DRUG SCREEN (HOSP PERFORMED)     Status: Abnormal   Collection Time   04/12/11  9:40 PM      Component Value Range Comment   Opiates NONE DETECTED  NONE DETECTED     Cocaine NONE DETECTED  NONE DETECTED     Benzodiazepines POSITIVE (*) NONE DETECTED     Amphetamines NONE DETECTED  NONE DETECTED     Tetrahydrocannabinol NONE DETECTED  NONE DETECTED     Barbiturates NONE DETECTED  NONE DETECTED     Dg Chest 2 View  04/12/2011  *RADIOLOGY REPORT*  Clinical Data: Hypotension.  CHEST - 2 VIEW  Comparison: None.  Findings: Normal sized heart.  Clear lungs.  Flattening of the hemidiaphragms.  Minimal central peribronchial thickening.  Mild dextroconvex scoliosis.  IMPRESSION: No acute abnormality.  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  Original Report Authenticated By: Darrol Angel, M.D.    Review of Systems  HENT: Negative.   Eyes: Negative.   Respiratory: Negative.   Cardiovascular: Negative.   Gastrointestinal: Negative.   Genitourinary: Negative.   Musculoskeletal: Negative.   Skin: Negative.   Neurological: Positive for  dizziness and weakness.  Endo/Heme/Allergies: Negative.   Psychiatric/Behavioral: Negative.     Blood pressure 115/77, pulse 81, temperature 98.6 F (37 C), temperature source Oral, resp. rate 18, height 5\' 11"  (1.803 m), weight 89.404 kg (197 lb 1.6 oz), SpO2 96.00%. Physical Exam  Constitutional: He is oriented to person, place, and time. He appears well-developed and well-nourished. No distress.  HENT:  Head: Normocephalic and atraumatic.  Right Ear: External ear normal.  Left Ear: External ear normal.  Nose: Nose normal.  Mouth/Throat: Oropharynx is clear and moist. No oropharyngeal exudate.  Eyes: Conjunctivae are normal. Pupils are equal, round, and reactive to light. Right eye exhibits no discharge. Left eye exhibits no discharge. No scleral icterus.  Neck: Normal range of motion. Neck supple.  Cardiovascular: Normal rate, regular rhythm and normal heart sounds.   Respiratory: Effort normal and breath sounds normal. No respiratory distress. He has no wheezes. He has no rales.  GI: Soft. Bowel sounds are normal. He exhibits no distension. There is no tenderness. There is no rebound.  Musculoskeletal: Normal range of motion. He exhibits no edema and no tenderness.  Neurological: He is alert and oriented to person, place, and time.       Moves upper and lower extremities 5/5.  Skin: Skin is warm and dry. No rash noted. He is not diaphoretic. No erythema.  Psychiatric: His behavior is normal.     Assessment/Plan #1. Hypotension - we do not know the exact cause. At this time we'll continue hydration and hold his antihypertensives for now. Blood Pressure is responding to fluids. He does not look septic. #2. Acute renal failure - probably from hypotension and diuretics and ACE inhibitors. We will hydrate and recheck his metabolic panel. Hold off antihypertensives for now. If his creatinine does not improve he'll need further studies including renal sonogram. Check urinalysis for  now. #3. History of alcoholism - place patient on alcohol withdrawal protocol. #4. History of hypothyroidism - check TSH. #5. History of anxiety and depression - currently present medications. #6. Chronic pain - continue present medications.  CODE STATUS - full code.  Naomy Esham N. 04/13/2011, 6:03 AM

## 2011-04-13 NOTE — ED Notes (Signed)
[facility] here for pt transport 

## 2011-04-13 NOTE — Progress Notes (Signed)
Patient admitted earlier today for complaint of weakness. Found to be hypotensive and with ARF and a Cr of 2.5. His BP meds have been held, is receiving IVF, and his CR is already down to 2.2. I suspect his ARF is from prerrenal azotemia and should improve with hydration. Given his complaint of weakness will ask PT to assess to make sure he is safe to return home upon DC.

## 2011-04-13 NOTE — ED Notes (Signed)
Pt given PO and tolerating well

## 2011-04-13 NOTE — Progress Notes (Signed)
Pt IV pump would not quit beeping due to occlusion when pt bends arm.  IV site was changed to [facility] and is infusing well.

## 2011-04-13 NOTE — ED Notes (Signed)
Family at bedside. Admission discussed and everyone ok with POC

## 2011-04-13 NOTE — Progress Notes (Signed)
Utilization Review Completed.Juan Wiggins T2/18/2013   

## 2011-04-14 DIAGNOSIS — R296 Repeated falls: Secondary | ICD-10-CM

## 2011-04-14 LAB — BASIC METABOLIC PANEL
CO2: 27 mEq/L (ref 19–32)
Chloride: 105 mEq/L (ref 96–112)
Glucose, Bld: 111 mg/dL — ABNORMAL HIGH (ref 70–99)
Potassium: 4.1 mEq/L (ref 3.5–5.1)
Sodium: 138 mEq/L (ref 135–145)

## 2011-04-14 LAB — CBC
HCT: 31.9 % — ABNORMAL LOW (ref 39.0–52.0)
Hemoglobin: 10.8 g/dL — ABNORMAL LOW (ref 13.0–17.0)
MCH: 30 pg (ref 26.0–34.0)
MCV: 88.6 fL (ref 78.0–100.0)
Platelets: 314 10*3/uL (ref 150–400)
RBC: 3.6 MIL/uL — ABNORMAL LOW (ref 4.22–5.81)
WBC: 4.8 10*3/uL (ref 4.0–10.5)

## 2011-04-14 LAB — RPR: RPR Ser Ql: NONREACTIVE

## 2011-04-14 NOTE — Evaluation (Signed)
Physical Therapy Evaluation  @@@D /C from PT due to no further immediate needs. Mobility Team to see while on 4700.  Consider neuro work up for spinal stenosis, etc.  Thank you.@@@   Patient Details Name: Juan Wiggins MRN: [id number] DOB: 10-13-1950 Today's Date: 04/14/2011  Problem List:  Patient Active Problem List  Diagnoses  . Prolapsed and thrombosed  . Hypotension  . ARF (acute renal failure)  . Hypothyroidism  . Alcohol abuse  . Recurrent falls    Past Medical History:  Past Medical History  Diagnosis Date  . Hepatitis A   . Hypertension   . Thyroid disease   . Hemorrhoids   . Arthritis   . Hypothyroidism    Past Surgical History:  Past Surgical History  Procedure Date  . Knee surgery   . Shoulder surgery   . Elbow surgery     PT Assessment/Plan/Recommendation PT Assessment Clinical Impression Statement: pt is a 61 y/o male admitted with progressive weakness and low BP witnessed by pt's girlfriend.  BP's are improving,b ut the pain and weakness continue.  Not further PT needs, but will have mobility team come see pt and keep mobile.  Consider further neurological testing for potential spinal stenosis. PT Recommendation/Assessment: Patent does not need any further PT services No Skilled PT: Patient at baseline level of functioning;Patient will have necessary level of assist by caregiver at discharge;Patient is independent with all acitivity/mobility PT Recommendation Follow Up Recommendations: No PT follow up Equipment Recommended: None recommended by PT PT Goals     PT Evaluation Precautions/Restrictions  Precautions Precautions: Fall;Other (comment) (mild risk) Prior Functioning  Home Living Lives With: Other (Comment) (father, caregive and girl friend) Type of Home: House Home Layout: Two level;Able to live on main level with bedroom/bathroom Alternate Level Stairs-Rails: Right;Left Alternate Level Stairs-Number of Steps: >12 Home Access: Level  entry Bathroom Shower/Tub: Engineer, manufacturing systems: Handicapped height Prior Function Level of Independence: Independent with basic ADLs;Independent with homemaking with ambulation;Independent with gait;Independent with transfers Able to Take Stairs?: Yes Driving: Yes 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 Arousal/Alertness: Awake/alert Overall Cognitive Status: Appears within functional limits for tasks assessed Sensation/Coordination Sensation Light Touch: Impaired by gross assessment (though numbness at great toes and finger tips) Coordination Gross Motor Movements are Fluid and Coordinated: Yes Fine Motor Movements are Fluid and Coordinated: Not tested Extremity Assessment RUE Assessment RUE Assessment: Within Functional Limits LUE Assessment LUE Assessment: Within Functional Limits (significant weakness due to pain with effort Bil) RLE Assessment RLE Assessment: Within Functional Limits LLE Assessment LLE Assessment: Within Functional Limits (significant weakness due to pain--L worse then R) Mobility (including Balance) Bed Mobility Bed Mobility: Yes Supine to Sit: 7: Independent Sit to Supine: 7: Independent Transfers Transfers: Yes Sit to Stand: 7: Independent Stand to Sit: 7: Independent Ambulation/Gait Ambulation/Gait: Yes Ambulation/Gait Assistance: 7: Independent Ambulation Distance (Feet): 360 Feet Assistive device: None Gait Pattern: Within Functional Limits (with wider BOS due to pain, still steady) Stairs: No  Posture/Postural Control Posture/Postural Control: No significant limitations Balance Balance Assessed:  (gnerally steady or self recoverable balance) Exercise    End of Session PT - End of Session Activity Tolerance: Patient tolerated treatment well;Patient limited by pain Patient left: in bed;with call bell in reach;with family/visitor present Nurse Communication: Mobility status for ambulation;Mobility status for  transfers General Behavior During Session: [facility] for tasks performed Cognition: [facility] for tasks performed  Arick Mareno, Eliseo Gum 04/14/2011, 1:12 PM  04/14/2011  Medon Bing, PT [phone] (pager)

## 2011-04-14 NOTE — Progress Notes (Signed)
Subjective: His only complaints are his chronic back pain issues with nerve-impingement.  Objective: Vital signs in last 24 hours: Temp:  [98.3 F (36.8 C)-99.2 F (37.3 C)] 98.3 F (36.8 C) (02/19 0621) Pulse Rate:  [77-96] 78  (02/19 0621) Resp:  [18] 18  (02/19 0621) BP: (126-156)/(70-89) 128/73 mmHg (02/19 0621) SpO2:  [92 %-95 %] 92 % (02/19 0621) Weight:  [88.95 kg (196 lb 1.6 oz)] 88.95 kg (196 lb 1.6 oz) (02/19 0621) Weight change: 7.303 kg (16 lb 1.6 oz) Last BM Date: 04/13/11  Intake/Output from previous day: 02/18 0701 - 02/19 0700 In: 3280 [P.O.:1560; I.V.:1720] Out: 3875 [Urine:3875] Total I/O In: 240 [P.O.:240] Out: 250 [Urine:250]   Physical Exam: General: Alert, awake, oriented x3, in no acute distress. HEENT: No bruits, no goiter. Heart: Regular rate and rhythm, without murmurs, rubs, gallops. Lungs: Clear to auscultation bilaterally. Abdomen: Soft, nontender, nondistended, positive bowel sounds. Extremities: No clubbing cyanosis or edema with positive pedal pulses. Neuro: Grossly intact, nonfocal.    Lab Results: Basic Metabolic Panel:  Basename 04/14/11 0535 04/13/11 0709  NA 138 137  K 4.1 4.4  CL 105 102  CO2 27 27  GLUCOSE 111* 91  BUN 12 18  CREATININE 1.40* 2.24*  CALCIUM 9.0 8.8  MG -- --  PHOS -- --   Liver Function Tests:  [facility] 04/13/11 0709 04/12/11 2111  AST 14 15  ALT 10 11  ALKPHOS 69 71  BILITOT 0.3 0.1*  PROT 5.8* 6.5  ALBUMIN 2.9* 3.3*   CBC:  Basename 04/14/11 0535 04/13/11 0709 04/12/11 2039  WBC 4.8 7.6 --  NEUTROABS -- -- 8.1*  HGB 10.8* 11.3* --  HCT 31.9* 33.2* --  MCV 88.6 87.6 --  PLT 314 339 --   Cardiac Enzymes:  Basename 04/13/11 2140 04/13/11 1350 04/13/11 0709  CKTOTAL 51 49 49  CKMB 1.9 2.1 2.5  CKMBINDEX -- -- --  TROPONINI <0.30 <0.30 <0.30   Thyroid Function Tests:  Basename 04/13/11 0709  TSH 5.607*  T4TOTAL --  FREET4 --  T3FREE --  THYROIDAB --   Urine Drug Screen: Drugs  of Abuse     Component Value Date/Time   LABOPIA NONE DETECTED 04/12/2011 2140   COCAINSCRNUR NONE DETECTED 04/12/2011 2140   LABBENZ POSITIVE* 04/12/2011 2140   AMPHETMU NONE DETECTED 04/12/2011 2140   THCU NONE DETECTED 04/12/2011 2140   LABBARB NONE DETECTED 04/12/2011 2140    Alcohol Level:  Basename 04/12/11 2111  ETH 31*   Urinalysis:  Basename 04/13/11 0842  COLORURINE YELLOW  LABSPEC 1.009  PHURINE 6.0  GLUCOSEU NEGATIVE  HGBUR NEGATIVE  BILIRUBINUR NEGATIVE  KETONESUR NEGATIVE  PROTEINUR NEGATIVE  UROBILINOGEN 0.2  NITRITE NEGATIVE  LEUKOCYTESUR NEGATIVE    Recent Results (from the past 240 hour(s))  MRSA PCR SCREENING     Status: Normal   Collection Time   04/13/11  9:12 AM      Component Value Range Status Comment   MRSA by PCR NEGATIVE  NEGATIVE  Final     Studies/Results: Dg Chest 2 View  04/12/2011  *RADIOLOGY REPORT*  Clinical Data: Hypotension.  CHEST - 2 VIEW  Comparison: None.  Findings: Normal sized heart.  Clear lungs.  Flattening of the hemidiaphragms.  Minimal central peribronchial thickening.  Mild dextroconvex scoliosis.  IMPRESSION: No acute abnormality.  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  Original Report Authenticated By: Darrol Angel, M.D.    Medications: Scheduled Meds:   . escitalopram  10 mg Oral  Daily  . folic acid  1 mg Oral Daily  . gabapentin  300 mg Oral TID  . levothyroxine  150 mcg Oral Daily  . LORazepam  0-4 mg Oral Q6H   Followed by  . LORazepam  0-4 mg Oral Q12H  . mulitivitamin with minerals  1 tablet Oral Daily  . pantoprazole  40 mg Oral Daily  . thiamine  100 mg Oral Daily   Or  . thiamine  100 mg Intravenous Daily  . zolpidem  10 mg Oral QHS   Continuous Infusions:   . sodium chloride 150 mL/hr at 04/14/11 0402   PRN Meds:.HYDROcodone-acetaminophen, LORazepam, LORazepam, ondansetron (ZOFRAN) IV, oxyCODONE  Assessment/Plan:  Principal Problem:  *Hypotension Active Problems:  ARF (acute renal  failure)  Hypothyroidism  Alcohol abuse  Recurrent falls   #1 Hypotension: I suspect he has been on too many BP lowering meds, All discontinued for now. BP improved with IVF.  #2 ARF: likely 2/2 prerenal azotemia. Improving nicely with IVF. Continue IVF today.  #3 ETOH abuse: thiamine/folate. Says he drinks scotch every day and most days more than once. Substance abuse counseling.  #4 Frequent falls: Suspect these are multifactorial: nerve-impingement issues, ETOh abuse, also probably longstanding hypotension. Check TSH/RPR/vit B12. Await PT eval. Has followup scheduled with Dr. Franky Macho.    LOS: 2 days   [facility] Pager: [phone] 04/14/2011, 10:54 AM

## 2011-04-14 NOTE — Progress Notes (Signed)
Utilization Review Completed.Juan Wiggins T2/19/2013   

## 2011-04-14 NOTE — Progress Notes (Signed)
D/C from PT, no further PT needs.  Pt will begin amb. Independently in the room.  Will have mobility team check on pt and assist with mobility as needed.  See Shadow chart for any mobility team notes.  04/14/2011  [place] Bing, PT [phone] (pager)

## 2011-04-14 NOTE — Progress Notes (Signed)
Clinical Social Worker received phone call from [facility] requesting a letter indicating pt is currently in hospital.  Pt has a court date tomorrow in [place].  CSW provided letter, on [facility] letterhead, to pt (Original plus 2 copies given) and placed one copy in chart.  No other CSW needs identified.    Angelia Mould, MSW, [place] [phone]

## 2011-04-15 LAB — GLUCOSE, CAPILLARY: Glucose-Capillary: 126 mg/dL — ABNORMAL HIGH (ref 70–99)

## 2011-04-15 LAB — BASIC METABOLIC PANEL
CO2: 25 mEq/L (ref 19–32)
Calcium: 8.6 mg/dL (ref 8.4–10.5)
GFR calc non Af Amer: 64 mL/min — ABNORMAL LOW (ref >90)
Glucose, Bld: 113 mg/dL — ABNORMAL HIGH (ref 70–99)
Potassium: 4 mEq/L (ref 3.5–5.1)
Sodium: 136 mEq/L (ref 135–145)

## 2011-04-15 MED ORDER — LISINOPRIL 20 MG PO TABS: 10.0000 mg | ORAL_TABLET | Freq: Every day | ORAL | Status: DC

## 2011-04-15 MED ORDER — THIAMINE HCL 100 MG PO TABS: 100.0000 mg | ORAL_TABLET | Freq: Every day | ORAL | Status: AC

## 2011-04-15 MED ORDER — OXYCODONE HCL 15 MG PO TB12: 15.0000 mg | ORAL_TABLET | Freq: Four times a day (QID) | ORAL | Status: DC | PRN

## 2011-04-15 NOTE — Progress Notes (Signed)
D/c instructions given to pt including f/u care and appointments, home medications, s/s of when to notify MD and diet/activity restrictions. Pt acknowledged receipt with all questions answered and pt verbalized understanding.  IV and telemetry removed from pt.  Will transport via wheelchair when pt ride is ready.

## 2011-04-15 NOTE — Discharge Summary (Signed)
Discharge Note  Name: Juan Wiggins MRN: [id number] DOB: 10-26-1950 61 y.o.  Date of Admission: 04/12/2011  7:57 PM Date of Discharge: 04/15/2011 Attending Physician: Chaya Jan,*  Discharge Diagnosis: Principal Problem:  *Hypotension Active Problems:  ARF (acute renal failure)  Hypothyroidism  Alcohol abuse  Recurrent falls  Follow up -Blood pressure -Creatinine/bmet Discharge Medications: Medication List  As of 04/15/2011  1:23 PM   STOP taking these medications         hydrochlorothiazide 25 MG tablet      naproxen 500 MG tablet         TAKE these medications         escitalopram 10 MG tablet   Commonly known as: LEXAPRO   Take 10 mg by mouth daily.      gabapentin 300 MG capsule   Commonly known as: NEURONTIN   Take 300 mg by mouth 3 (three) times daily.      HYDROcodone-acetaminophen 5-500 MG per tablet   Commonly known as: VICODIN   Take 1 tablet by mouth every 4 (four) hours as needed. For pain      lansoprazole 15 MG capsule   Commonly known as: PREVACID   Take 15 mg by mouth daily.      levothyroxine 150 MCG tablet   Commonly known as: SYNTHROID, LEVOTHROID   Take 150 mcg by mouth daily.      lisinopril 20 MG tablet   Commonly known as: PRINIVIL,ZESTRIL   Take 0.5 tablets (10 mg total) by mouth daily.      oxyCODONE 15 MG Tb12   Commonly known as: OXYCONTIN   Take 1 tablet (15 mg total) by mouth every 6 (six) hours as needed. For pain      promethazine 25 MG tablet   Commonly known as: PHENERGAN   Take 25 mg by mouth every 6 (six) hours as needed. For nausea      testosterone cypionate 200 MG/ML injection   Commonly known as: DEPOTESTOTERONE CYPIONATE   Inject 800 mg into the muscle every 30 (thirty) days.      thiamine 100 MG tablet   Take 1 tablet (100 mg total) by mouth daily.      XANAX XR 0.5 MG 24 hr tablet   Generic drug: ALPRAZolam   Take 5 mg by mouth every morning.      zolpidem 10 MG tablet   Commonly known as:  AMBIEN   Take 10 mg by mouth at bedtime.            Disposition and follow-up:   Mr.Juan Wiggins was discharged from [facility] in improved/stable condition.    Follow-up Appointments: Discharge Orders    Future Orders Please Complete By Expires   Diet - low sodium heart healthy      Increase activity slowly         Consultations:    Procedures Performed:  Dg Chest 2 View  04/12/2011  *RADIOLOGY REPORT*  Clinical Data: Hypotension.  CHEST - 2 VIEW  Comparison: None.  Findings: Normal sized heart.  Clear lungs.  Flattening of the hemidiaphragms.  Minimal central peribronchial thickening.  Mild dextroconvex scoliosis.  IMPRESSION: No acute abnormality.  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  Original Report Authenticated By: Darrol Angel, M.D.     Admission HPI Pt is29 year-old male with history of hypothyroidism alcoholism and hypertension suddenly felt weak at home. His girlfriend checked his blood pressure and was found to be  low. They called EMS. His blood pressure systolic was found around 70. Given normal saline bolus and taken to the ER med center [facility]. There his the pressure was still in the lower side but improved with fluids. In addition his creatinine was found to be 2.5. We don't have a baseline creatinine levels on him. At this time patient has been admitted for further management. Patient denies any nausea vomiting abdominal pain dysuria or discharges or diarrhea. Patient denies any chest pain shortness of breath or any focal deficits.  Physical exam General: Alert, awake, oriented x3, in no acute distress.  HEENT: No bruits, no goiter.  Heart: Regular rate and rhythm, without murmurs, rubs, gallops.  Lungs: Clear to auscultation bilaterally.  Abdomen: Soft, nontender, nondistended, positive bowel sounds.  Extremities: No clubbing cyanosis or edema with positive pedal pulses.  Neuro: Grossly intact, nonfocal.   Hospital Course  by problem list: Principal Problem:  *Hypotension Active Problems:  ARF (acute renal failure)  Hypothyroidism  Alcohol abuse  Recurrent falls #1 Hypotension: Upon admission he was hydrated with IV fluids and his antihypertensives were held. Cardiac enzymes were cycled and came back negative, and his hemoglobin was stable. The hypotension resolved with the above measures. The impression was that the hypotension was likely secondary to hypovolemia/volume depletion with his antihypertensive meds are contributing as well. He has been instructed to discontinue his hydrochlorothiazide upon discharge and his lisinopril dose decreased. #2 ARF: likely 2/2 prerenal azotemia vs ATN given the hypotension, and patient was also on lisinopril and Naprosyn. His creatinine normalized -1.21 with hydration, and HCTZ and Naprosyn have been discontinued, and his to followup with his PCP for further monitoring of his renal function, and blood pressures and adjustment of his medications as appropriate.  #3 ETOH abuse: Patient was placed thiamine/folate, and Ativan. He stated that he drinks scotch every day and most days more than once. Substance abuse counseling was done. he does not have any signs of withdrawal at this time, his to follow up outpatient the #4 Frequent falls: Suspect these are multifactorial: nerve-impingement issues, ETOh abuse, also probably longstanding hypotension. Check TSH/RPR/vit B12. Await PT eval. Has followup scheduled with Dr. Franky Macho.  #5 back pain -pain management was continued in the hospital , and he  is to followup with Dr. Shon Baton and Dr. Franky Macho as scheduled.     Discharge Vitals:  BP 148/79  Pulse 67  Temp(Src) 98.5 F (36.9 C) (Oral)  Resp 15  Ht 5\' 11"  (1.803 m)  Wt 91.3 kg (201 lb 4.5 oz)  BMI 28.07 kg/m2  SpO2 98%  Discharge Labs:  Results for orders placed during the hospital encounter of 04/12/11 (from the past 24 hour(s))  BASIC METABOLIC PANEL     Status: Abnormal    Collection Time   04/15/11  5:50 AM      Component Value Range   Sodium 136  135 - 145 (mEq/L)   Potassium 4.0  3.5 - 5.1 (mEq/L)   Chloride 106  96 - 112 (mEq/L)   CO2 25  19 - 32 (mEq/L)   Glucose, Bld 113 (*) 70 - 99 (mg/dL)   BUN 8  6 - 23 (mg/dL)   Creatinine, Ser 7.82  0.50 - 1.35 (mg/dL)   Calcium 8.6  8.4 - 95.6 (mg/dL)   GFR calc non Af Amer 64 (*) >90 (mL/min)   GFR calc Af Amer 75 (*) >90 (mL/min)  GLUCOSE, CAPILLARY     Status: Abnormal   Collection Time  04/15/11 11:25 AM      Component Value Range   Glucose-Capillary 126 (*) 70 - 99 (mg/dL)    Signed: Kela Millin 04/15/2011, 1:23 PM

## 2011-04-17 ENCOUNTER — Emergency Department (HOSPITAL_BASED_OUTPATIENT_CLINIC_OR_DEPARTMENT_OTHER)
Admission: EM | Admit: 2011-04-17 | Discharge: 2011-04-17 | Disposition: A | Discharge status: Home / Self Care | Payer: BC Managed Care – PPO | Attending: Emergency Medicine | Admitting: Emergency Medicine

## 2011-04-17 ENCOUNTER — Encounter (HOSPITAL_BASED_OUTPATIENT_CLINIC_OR_DEPARTMENT_OTHER): Payer: Self-pay | Admitting: *Deleted

## 2011-04-17 DIAGNOSIS — N4889 Other specified disorders of penis: Secondary | ICD-10-CM | POA: Insufficient documentation

## 2011-04-17 DIAGNOSIS — I10 Essential (primary) hypertension: Secondary | ICD-10-CM | POA: Insufficient documentation

## 2011-04-17 DIAGNOSIS — R369 Urethral discharge, unspecified: Secondary | ICD-10-CM | POA: Insufficient documentation

## 2011-04-17 DIAGNOSIS — E039 Hypothyroidism, unspecified: Secondary | ICD-10-CM | POA: Insufficient documentation

## 2011-04-17 DIAGNOSIS — Z79899 Other long term (current) drug therapy: Secondary | ICD-10-CM | POA: Insufficient documentation

## 2011-04-17 MED ORDER — CEPHALEXIN 250 MG PO CAPS
500.0000 mg | ORAL_CAPSULE | Freq: Once | ORAL | Status: AC
  Administered 2011-04-17: 500 mg via ORAL
  Filled 2011-04-17: qty 2

## 2011-04-17 MED ORDER — CEPHALEXIN 500 MG PO CAPS: 500.0000 mg | ORAL_CAPSULE | Freq: Four times a day (QID) | ORAL | Status: AC

## 2011-04-17 MED ORDER — DIPHENHYDRAMINE HCL 50 MG PO CAPS
50.0000 mg | ORAL_CAPSULE | Freq: Once | ORAL | Status: AC
  Administered 2011-04-17: 50 mg via ORAL
  Filled 2011-04-17: qty 1

## 2011-04-17 MED ORDER — HYDROCORTISONE 1 % EX CREA: TOPICAL_CREAM | Freq: Two times a day (BID) | CUTANEOUS | Status: DC

## 2011-04-17 MED ORDER — DIPHENHYDRAMINE HCL 25 MG PO CAPS
ORAL_CAPSULE | ORAL | Status: AC
  Filled 2011-04-17: qty 1

## 2011-04-17 MED ORDER — DIPHENHYDRAMINE HCL 25 MG PO CAPS
ORAL_CAPSULE | ORAL | Status: AC
  Filled 2011-04-17: qty 2

## 2011-04-17 NOTE — ED Notes (Signed)
CALLED DIRECT TO DR. Annabell Howells [phone]  TO CALL BACK TO [phone]

## 2011-04-17 NOTE — ED Notes (Signed)
Pt reports this afternoon he started having penile swelling and an odorous discharge.

## 2011-04-17 NOTE — ED Provider Notes (Signed)
History     CSN: [id number]  Arrival date & time 04/17/11  1807   First MD Initiated Contact with Patient 04/17/11 1824      Chief Complaint  Patient presents with  . Penile Discharge  . Groin Swelling    (Consider location/radiation/quality/duration/timing/severity/associated sxs/prior treatment) The history is provided by the patient.   the patient reports developing edema of the foreskin at the base of this glans.  He is a circumcised male.  He denies dysuria urinary frequency.  He denies recent intercourse or masturbation.  He denies recent trauma.  He denies fever or chills.  He denies penile discharge.  He denies testicular pain groin pain paranasal pain or swelling.  He's never had anything happen like this before.  He does not remember a recent bug bite.  He denies rash.  He reports the swelling on the anterior aspect has increased in the past several hours.  Nothing worsens the symptoms.  Nothing improves his symptoms.  Symptoms are constant  Past Medical History  Diagnosis Date  . Hepatitis A   . Hypertension   . Thyroid disease   . Hemorrhoids   . Arthritis   . Hypothyroidism     Past Surgical History  Procedure Date  . Knee surgery   . Shoulder surgery   . Elbow surgery     History reviewed. No pertinent family history.  History  Substance Use Topics  . Smoking status: Former Games developer  . Smokeless tobacco: Not on file  . Alcohol Use: Yes     3 - 4 per day      Review of Systems  Genitourinary: Positive for discharge.  All other systems reviewed and are negative.    Allergies  Review of patient's allergies indicates no known allergies.  Home Medications   Current Outpatient Rx  Name Route Sig Dispense Refill  . ALPRAZOLAM ER 0.5 MG PO TB24 Oral Take 0.5 mg by mouth every morning.     Marland Kitchen ESCITALOPRAM OXALATE 10 MG PO TABS Oral Take 10 mg by mouth daily.      Marland Kitchen GABAPENTIN 300 MG PO CAPS Oral Take 300 mg by mouth 3 (three) times daily.    Marland Kitchen  HYDROCODONE-ACETAMINOPHEN 5-500 MG PO TABS Oral Take 1 tablet by mouth every 4 (four) hours as needed. For pain    . LANSOPRAZOLE 15 MG PO CPDR Oral Take 15 mg by mouth daily.    Marland Kitchen LEVOTHYROXINE SODIUM 150 MCG PO TABS Oral Take 150 mcg by mouth daily.    Marland Kitchen LISINOPRIL 20 MG PO TABS Oral Take 0.5 tablets (10 mg total) by mouth daily. 30 tablet 0  . OXYCODONE HCL ER 15 MG PO TB12 Oral Take 1 tablet (15 mg total) by mouth every 6 (six) hours as needed. For pain 30 tablet 0  . PROMETHAZINE HCL 25 MG PO TABS Oral Take 25 mg by mouth every 6 (six) hours as needed. For nausea    . THIAMINE HCL 100 MG PO TABS Oral Take 1 tablet (100 mg total) by mouth daily. 30 tablet 0  . ZOLPIDEM TARTRATE 10 MG PO TABS Oral Take 10 mg by mouth at bedtime.    . CEPHALEXIN 500 MG PO CAPS Oral Take 1 capsule (500 mg total) by mouth 4 (four) times daily. 20 capsule 0  . HYDROCORTISONE 1 % EX CREA Topical Apply topically 2 (two) times daily. 15 g 0  . TESTOSTERONE CYPIONATE 200 MG/ML IM OIL Intramuscular Inject 800 mg into the muscle  every 30 (thirty) days.      BP 143/86  Pulse 84  Temp(Src) 98.3 F (36.8 C) (Oral)  Resp 18  SpO2 98%  Physical Exam  Constitutional: He is oriented to person, place, and time. He appears well-developed and well-nourished.  HENT:  Head: Normocephalic.  Eyes: EOM are normal.  Neck: Normal range of motion.  Pulmonary/Chest: Effort normal.  Genitourinary:       Circumcised male.  Normal glans.  Mild edema and swelling without erythema warmth or tenderness of foreskin just proximal to the base of the glans.  Bilateral normal testicles.  No inguinal swelling.  No changes to scrotum noted.  No penile tenderness.  No penile discharge.  Normal-appearing urethral meatus.  Edema of foreskin a circumferential.  Normal perfusion of glans penis  Musculoskeletal: Normal range of motion.  Neurological: He is alert and oriented to person, place, and time.  Psychiatric: He has a normal mood and  affect.    ED Course  Procedures (including critical care time)   Labs Reviewed  GC/CHLAMYDIA PROBE AMP, GENITAL   No results found.   1. Penile swelling       MDM  Unclear etiology of the edema.  My suspicion for infection is low however I will cover the patient with Keflex.  This could have an allergic component and thus the patient was given hydrocortisone cream and Benadryl.  I discussed the case with the urologist on call Dr. Annabell Howells and he did not have any additional ideas        Lyanne Co, MD 04/17/11 [phone]

## 2011-04-19 ENCOUNTER — Encounter (HOSPITAL_COMMUNITY): Payer: Self-pay | Admitting: *Deleted

## 2011-04-20 ENCOUNTER — Other Ambulatory Visit (HOSPITAL_COMMUNITY): Payer: Self-pay | Admitting: Neurosurgery

## 2011-04-20 ENCOUNTER — Other Ambulatory Visit: Payer: Self-pay | Admitting: Neurosurgery

## 2011-04-20 DIAGNOSIS — M545 Low back pain, unspecified: Secondary | ICD-10-CM

## 2011-04-20 DIAGNOSIS — M542 Cervicalgia: Secondary | ICD-10-CM

## 2011-05-01 ENCOUNTER — Other Ambulatory Visit: Payer: Self-pay | Admitting: Neurosurgery

## 2011-05-01 DIAGNOSIS — M549 Dorsalgia, unspecified: Secondary | ICD-10-CM

## 2011-05-01 DIAGNOSIS — M542 Cervicalgia: Secondary | ICD-10-CM

## 2011-05-06 ENCOUNTER — Other Ambulatory Visit (HOSPITAL_COMMUNITY): Payer: Self-pay

## 2011-08-10 ENCOUNTER — Other Ambulatory Visit: Payer: Self-pay | Admitting: Pain Medicine

## 2011-08-10 DIAGNOSIS — M79605 Pain in left leg: Secondary | ICD-10-CM

## 2011-08-10 DIAGNOSIS — M545 Low back pain: Secondary | ICD-10-CM

## 2011-08-13 ENCOUNTER — Other Ambulatory Visit: Payer: Self-pay

## 2011-08-14 ENCOUNTER — Ambulatory Visit
Admission: RE | Admit: 2011-08-14 | Discharge: 2011-08-14 | Disposition: A | Discharge status: Home / Self Care | Payer: BC Managed Care – PPO | Source: Ambulatory Visit | Attending: Pain Medicine | Admitting: Pain Medicine

## 2011-08-14 DIAGNOSIS — M545 Low back pain: Secondary | ICD-10-CM

## 2011-08-14 DIAGNOSIS — M79604 Pain in right leg: Secondary | ICD-10-CM

## 2012-04-12 ENCOUNTER — Other Ambulatory Visit: Payer: Self-pay | Admitting: Orthopedic Surgery

## 2012-04-12 DIAGNOSIS — M545 Low back pain: Secondary | ICD-10-CM

## 2012-04-28 ENCOUNTER — Inpatient Hospital Stay: Admission: RE | Admit: 2012-04-28 | Payer: Self-pay | Source: Ambulatory Visit

## 2012-04-28 ENCOUNTER — Ambulatory Visit
Admission: RE | Admit: 2012-04-28 | Discharge: 2012-04-28 | Disposition: A | Discharge status: Home / Self Care | Payer: BC Managed Care – PPO | Source: Ambulatory Visit | Attending: Orthopedic Surgery | Admitting: Orthopedic Surgery

## 2012-04-28 DIAGNOSIS — M545 Low back pain: Secondary | ICD-10-CM

## 2012-06-23 ENCOUNTER — Other Ambulatory Visit (HOSPITAL_BASED_OUTPATIENT_CLINIC_OR_DEPARTMENT_OTHER): Payer: Self-pay | Admitting: Family Medicine

## 2012-06-23 ENCOUNTER — Ambulatory Visit (HOSPITAL_BASED_OUTPATIENT_CLINIC_OR_DEPARTMENT_OTHER)
Admission: RE | Admit: 2012-06-23 | Discharge: 2012-06-23 | Disposition: A | Discharge status: Home / Self Care | Payer: 59 | Source: Ambulatory Visit | Attending: Family Medicine | Admitting: Family Medicine

## 2012-06-23 DIAGNOSIS — R609 Edema, unspecified: Secondary | ICD-10-CM | POA: Insufficient documentation

## 2012-06-23 DIAGNOSIS — L539 Erythematous condition, unspecified: Secondary | ICD-10-CM

## 2012-08-29 ENCOUNTER — Encounter: Payer: Self-pay | Admitting: Surgery

## 2012-08-31 ENCOUNTER — Other Ambulatory Visit: Payer: Self-pay | Admitting: *Deleted

## 2012-08-31 DIAGNOSIS — I83893 Varicose veins of bilateral lower extremities with other complications: Secondary | ICD-10-CM

## 2012-09-14 ENCOUNTER — Encounter: Payer: Self-pay | Admitting: Vascular Surgery

## 2012-09-15 ENCOUNTER — Encounter: Payer: Self-pay | Admitting: Vascular Surgery

## 2012-09-15 ENCOUNTER — Ambulatory Visit (INDEPENDENT_AMBULATORY_CARE_PROVIDER_SITE_OTHER): Payer: 59 | Admitting: Vascular Surgery

## 2012-09-15 ENCOUNTER — Encounter (INDEPENDENT_AMBULATORY_CARE_PROVIDER_SITE_OTHER): Payer: 59 | Admitting: *Deleted

## 2012-09-15 VITALS — BP 154/86 | HR 97 | Ht 71.0 in | Wt 226.7 lb

## 2012-09-15 DIAGNOSIS — I83893 Varicose veins of bilateral lower extremities with other complications: Secondary | ICD-10-CM | POA: Insufficient documentation

## 2012-09-15 NOTE — Progress Notes (Signed)
[facility] [place] HISTORY AND PHYSICAL   History of Present Illness:  Patient is a 62 y.o. year old male who presents for evaluation of chronic leg swelling and pain.  Right and left legs are equal. He states that his legs hurt from the time he gets out of bed in the morning to the time he goes to bed in the evening. He denies prior history of DVT. He has no family history of varicose veins. He is not previously worn compression stockings. He has had bilateral knee arthroscopies in the past but denies any trauma. He primarily complains of pain in his knee joints in the pretibial area and in the upper thigh. He also has chronic back pain and is currently being evaluated by Dr. Shon Baton. He has had several epidural injections with some mild improvement. He has lost 15 pounds recently on purpose.  Other medical problems include hypertension arthritis both of which are stable.     Past Medical History  Diagnosis Date  . Hepatitis A   . Hypertension   . Thyroid disease   . Hemorrhoids   . Arthritis   . Hypothyroidism     Past Surgical History  Procedure Laterality Date  . Knee surgery    . Shoulder surgery    . Elbow surgery       Social History History  Substance Use Topics  . Smoking status: Current Every Day Smoker -- 10 years    Types: Cigars  . Smokeless tobacco: Never Used     Comment: used cigarettes 20 years ago  . Alcohol Use: 3.0 oz/week    5 Shots of liquor per week    Family History Family History  Problem Relation Age of Onset  . Hypertension Mother   . Other Mother     varicose veins  . Hypertension Sister     Allergies  No Known Allergies   Current Outpatient Prescriptions  Medication Sig Dispense Refill  . ALPRAZolam (XANAX XR) 0.5 MG 24 hr tablet Take 0.5 mg by mouth every morning.       . escitalopram (LEXAPRO) 10 MG tablet Take 10 mg by mouth daily.        Marland Kitchen gabapentin (NEURONTIN) 300 MG capsule Take 300 mg by mouth 3 (three)  times daily.      Marland Kitchen HYDROcodone-acetaminophen (VICODIN) 5-500 MG per tablet Take 1 tablet by mouth every 4 (four) hours as needed. For pain      . hydrocortisone cream 1 % Apply topically 2 (two) times daily.  15 g  0  . lansoprazole (PREVACID) 15 MG capsule Take 15 mg by mouth daily.      Marland Kitchen levothyroxine (SYNTHROID, LEVOTHROID) 150 MCG tablet Take 150 mcg by mouth daily.      Marland Kitchen lisinopril (PRINIVIL,ZESTRIL) 20 MG tablet Take 0.5 tablets (10 mg total) by mouth daily.  30 tablet  0  . oxyCODONE (OXYCONTIN) 15 MG TB12 Take 1 tablet (15 mg total) by mouth every 6 (six) hours as needed. For pain  30 tablet  0  . promethazine (PHENERGAN) 25 MG tablet Take 25 mg by mouth every 6 (six) hours as needed. For nausea      . testosterone cypionate (DEPOTESTOTERONE CYPIONATE) 200 MG/ML injection Inject 800 mg into the muscle every 30 (thirty) days.      Marland Kitchen zolpidem (AMBIEN) 10 MG tablet Take 10 mg by mouth at bedtime.       No current facility-administered medications for this visit.  ROS:   General:  No weight loss, Fever, chills  HEENT: No recent headaches, no nasal bleeding, no visual changes, no sore throat  Neurologic: No dizziness, blackouts, seizures. No recent symptoms of stroke or mini- stroke. No recent episodes of slurred speech, or temporary blindness.  Cardiac: No recent episodes of chest pain/pressure, no shortness of breath at rest.  No shortness of breath with exertion.  Denies history of atrial fibrillation or irregular heartbeat  Vascular: No history of rest pain in feet.  No history of claudication.  No history of non-healing ulcer, No history of DVT   Pulmonary: No home oxygen, no productive cough, no hemoptysis,  No asthma or wheezing  Musculoskeletal:  [x ] Arthritis, [ x] Low back pain,  [ x] Joint pain  Hematologic:No history of hypercoagulable state.  No history of easy bleeding.  No history of anemia  Gastrointestinal: No hematochezia or melena,  No gastroesophageal  reflux, no trouble swallowing  Urinary: [ ]  chronic Kidney disease, [ ]  on HD - [ ]  MWF or [ ]  TTHS, [ ]  Burning with urination, [ ]  Frequent urination, [ ]  Difficulty urinating;   Skin: No rashes  Psychological: No history of anxiety,  No history of depression   Physical Examination  Filed Vitals:   09/15/12 1525  BP: 154/86  Pulse: 97  Height: 5\' 11"  (1.803 m)  Weight: 226 lb 11.2 oz (102.83 kg)  SpO2: 97%    Body mass index is 31.63 kg/(m^2).  General:  Alert and oriented, no acute distress HEENT: Normal Neck: No bruit or JVD Pulmonary: Clear to auscultation bilaterally Cardiac: Regular Rate and Rhythm without murmur Abdomen: Soft, non-tender, non-distended, no mass, obese Skin: No rash, scattered superficial varicosities spider-type 1 mm or less both lower extremities no large palpable or obvious varicosities Extremity Pulses:  2+ radial, brachial, femoral, posterior tibial pulses bilaterally Musculoskeletal: No deformity trace edema  Neurologic: Upper and lower extremity motor 5/5 and symmetric  DATA: Patient had a venous reflux exam performed today. The left lower extremity had some evidence of mild deep vein reflux in the left leg the superficial system was intact.  Right lower extremity had a completely normal venous reflux study with patent and competent deep and superficial system   ASSESSMENT: Chronic leg swelling probably multifactorial but with no significant venous pathology that would require intervention. He has some element of pain which is probably musculoskeletal in nature he does develop some swelling in his lower extremities and this may be partially due to some component of venous hypertension from obesity   PLAN:  I discussed with the patient today that 75% of patients can improve the leg swelling symptoms with weight loss. He states that he has changed his diet but he is not really interested in any sort of exercise program. I also discussed with him  wearing bilateral lower extremity compression stockings to give him symptomatic relief. He was also not interested in filling a prescription for these. I did discuss with them that he is not at risk for limb loss and these will mainly be primarily nuisance problems for him and that he would have to decide whether wearing the stockings would improve his quality of life or wearing his stockings would decrease his quality of life. I also did talk with them about a walking program of 30 minutes daily. He stated that he really didn't feel like exercising. Hopefully he will consider some these interventions as I believe it would give him a positive  outcome in the long run. He will followup on as-needed basis.  Fabienne Bruns, MD [facility] [place] Office: [phone] Pager: [phone]

## 2012-09-16 ENCOUNTER — Other Ambulatory Visit: Payer: Self-pay | Admitting: Family Medicine

## 2012-10-03 ENCOUNTER — Encounter: Payer: Self-pay | Admitting: Surgery

## 2012-11-07 ENCOUNTER — Telehealth: Payer: Self-pay | Admitting: Family Medicine

## 2013-07-06 ENCOUNTER — Emergency Department (HOSPITAL_COMMUNITY)
Admission: EM | Admit: 2013-07-06 | Discharge: 2013-07-07 | Disposition: A | Discharge status: Other Acute Inpatient Hospital | Payer: 59 | Attending: Emergency Medicine | Admitting: Emergency Medicine

## 2013-07-06 ENCOUNTER — Encounter (HOSPITAL_COMMUNITY): Payer: Self-pay | Admitting: Emergency Medicine

## 2013-07-06 ENCOUNTER — Other Ambulatory Visit: Payer: Self-pay

## 2013-07-06 DIAGNOSIS — R4589 Other symptoms and signs involving emotional state: Secondary | ICD-10-CM

## 2013-07-06 DIAGNOSIS — M069 Rheumatoid arthritis, unspecified: Secondary | ICD-10-CM | POA: Insufficient documentation

## 2013-07-06 DIAGNOSIS — F3289 Other specified depressive episodes: Secondary | ICD-10-CM | POA: Insufficient documentation

## 2013-07-06 DIAGNOSIS — F10229 Alcohol dependence with intoxication, unspecified: Secondary | ICD-10-CM | POA: Insufficient documentation

## 2013-07-06 DIAGNOSIS — R0602 Shortness of breath: Secondary | ICD-10-CM | POA: Insufficient documentation

## 2013-07-06 DIAGNOSIS — R4689 Other symptoms and signs involving appearance and behavior: Secondary | ICD-10-CM

## 2013-07-06 DIAGNOSIS — I10 Essential (primary) hypertension: Secondary | ICD-10-CM | POA: Insufficient documentation

## 2013-07-06 DIAGNOSIS — Z79899 Other long term (current) drug therapy: Secondary | ICD-10-CM | POA: Insufficient documentation

## 2013-07-06 DIAGNOSIS — F1020 Alcohol dependence, uncomplicated: Secondary | ICD-10-CM | POA: Diagnosis present

## 2013-07-06 DIAGNOSIS — M129 Arthropathy, unspecified: Secondary | ICD-10-CM | POA: Insufficient documentation

## 2013-07-06 DIAGNOSIS — E039 Hypothyroidism, unspecified: Secondary | ICD-10-CM | POA: Insufficient documentation

## 2013-07-06 DIAGNOSIS — Z8619 Personal history of other infectious and parasitic diseases: Secondary | ICD-10-CM | POA: Insufficient documentation

## 2013-07-06 DIAGNOSIS — F411 Generalized anxiety disorder: Secondary | ICD-10-CM | POA: Insufficient documentation

## 2013-07-06 DIAGNOSIS — M549 Dorsalgia, unspecified: Secondary | ICD-10-CM | POA: Insufficient documentation

## 2013-07-06 DIAGNOSIS — F430 Acute stress reaction: Secondary | ICD-10-CM | POA: Insufficient documentation

## 2013-07-06 DIAGNOSIS — F1010 Alcohol abuse, uncomplicated: Secondary | ICD-10-CM

## 2013-07-06 DIAGNOSIS — R45851 Suicidal ideations: Secondary | ICD-10-CM | POA: Insufficient documentation

## 2013-07-06 DIAGNOSIS — F329 Major depressive disorder, single episode, unspecified: Secondary | ICD-10-CM | POA: Diagnosis present

## 2013-07-06 DIAGNOSIS — F17200 Nicotine dependence, unspecified, uncomplicated: Secondary | ICD-10-CM | POA: Insufficient documentation

## 2013-07-06 DIAGNOSIS — F32A Depression, unspecified: Secondary | ICD-10-CM

## 2013-07-06 LAB — CBC WITH DIFFERENTIAL/PLATELET
BASOS ABS: 0 10*3/uL (ref 0.0–0.1)
BASOS PCT: 1 % (ref 0–1)
Eosinophils Absolute: 0.2 10*3/uL (ref 0.0–0.7)
Eosinophils Relative: 3 % (ref 0–5)
HCT: 41.5 % (ref 39.0–52.0)
Hemoglobin: 14.6 g/dL (ref 13.0–17.0)
Lymphocytes Relative: 18 % (ref 12–46)
Lymphs Abs: 1.2 10*3/uL (ref 0.7–4.0)
MCH: 33.6 pg (ref 26.0–34.0)
MCHC: 35.2 g/dL (ref 30.0–36.0)
MCV: 95.6 fL (ref 78.0–100.0)
Monocytes Absolute: 0.4 10*3/uL (ref 0.1–1.0)
Monocytes Relative: 6 % (ref 3–12)
NEUTROS ABS: 4.7 10*3/uL (ref 1.7–7.7)
Neutrophils Relative %: 72 % (ref 43–77)
PLATELETS: 372 10*3/uL (ref 150–400)
RBC: 4.34 MIL/uL (ref 4.22–5.81)
RDW: 13.4 % (ref 11.5–15.5)
WBC: 6.4 10*3/uL (ref 4.0–10.5)

## 2013-07-06 LAB — COMPREHENSIVE METABOLIC PANEL
ALBUMIN: 3.8 g/dL (ref 3.5–5.2)
ALT: 17 U/L (ref 0–53)
AST: 23 U/L (ref 0–37)
Alkaline Phosphatase: 88 U/L (ref 39–117)
BUN: 9 mg/dL (ref 6–23)
CHLORIDE: 100 meq/L (ref 96–112)
CO2: 28 meq/L (ref 19–32)
CREATININE: 1.05 mg/dL (ref 0.50–1.35)
Calcium: 9.7 mg/dL (ref 8.4–10.5)
GFR calc Af Amer: 87 mL/min — ABNORMAL LOW (ref >90)
GFR, EST NON AFRICAN AMERICAN: 75 mL/min — AB (ref >90)
Glucose, Bld: 122 mg/dL — ABNORMAL HIGH (ref 70–99)
Potassium: 4.1 mEq/L (ref 3.7–5.3)
SODIUM: 140 meq/L (ref 137–147)
Total Bilirubin: 0.2 mg/dL — ABNORMAL LOW (ref 0.3–1.2)
Total Protein: 7.8 g/dL (ref 6.0–8.3)

## 2013-07-06 LAB — RAPID URINE DRUG SCREEN, HOSP PERFORMED
AMPHETAMINES: NOT DETECTED
BARBITURATES: NOT DETECTED
Benzodiazepines: POSITIVE — AB
Cocaine: NOT DETECTED
OPIATES: NOT DETECTED
TETRAHYDROCANNABINOL: NOT DETECTED

## 2013-07-06 LAB — SALICYLATE LEVEL: Salicylate Lvl: <2 mg/dL — ABNORMAL LOW (ref 2.8–20.0)

## 2013-07-06 LAB — ACETAMINOPHEN LEVEL: Acetaminophen (Tylenol), Serum: <15 ug/mL (ref 10–30)

## 2013-07-06 LAB — ETHANOL: Alcohol, Ethyl (B): 207 mg/dL — ABNORMAL HIGH (ref 0–11)

## 2013-07-06 MED ORDER — VITAMIN B-1 100 MG PO TABS
100.0000 mg | ORAL_TABLET | Freq: Every day | ORAL | Status: DC
  Administered 2013-07-06 – 2013-07-07 (×2): 100 mg via ORAL
  Filled 2013-07-06 (×2): qty 1

## 2013-07-06 MED ORDER — LORAZEPAM 2 MG/ML IJ SOLN
1.0000 mg | Freq: Once | INTRAMUSCULAR | Status: AC
  Administered 2013-07-06: 1 mg via INTRAMUSCULAR
  Filled 2013-07-06: qty 1

## 2013-07-06 MED ORDER — LISINOPRIL 10 MG PO TABS
10.0000 mg | ORAL_TABLET | Freq: Every day | ORAL | Status: DC
  Administered 2013-07-07: 10 mg via ORAL
  Filled 2013-07-06: qty 1

## 2013-07-06 MED ORDER — NICOTINE 21 MG/24HR TD PT24: 21.0000 mg | MEDICATED_PATCH | Freq: Every day | TRANSDERMAL | Status: DC

## 2013-07-06 MED ORDER — PROMETHAZINE HCL 25 MG/ML IJ SOLN
12.5000 mg | Freq: Once | INTRAMUSCULAR | Status: AC
  Administered 2013-07-06: 12.5 mg via INTRAMUSCULAR
  Filled 2013-07-06: qty 1

## 2013-07-06 MED ORDER — THIAMINE HCL 100 MG/ML IJ SOLN: 100.0000 mg | Freq: Every day | INTRAMUSCULAR | Status: DC

## 2013-07-06 MED ORDER — LORAZEPAM 1 MG PO TABS: 0.0000 mg | ORAL_TABLET | Freq: Two times a day (BID) | ORAL | Status: DC

## 2013-07-06 MED ORDER — IBUPROFEN 200 MG PO TABS: 600.0000 mg | ORAL_TABLET | Freq: Three times a day (TID) | ORAL | Status: DC | PRN

## 2013-07-06 MED ORDER — ALUM & MAG HYDROXIDE-SIMETH 200-200-20 MG/5ML PO SUSP: 30.0000 mL | ORAL | Status: DC | PRN

## 2013-07-06 MED ORDER — LORAZEPAM 1 MG PO TABS
1.0000 mg | ORAL_TABLET | Freq: Three times a day (TID) | ORAL | Status: DC | PRN
  Administered 2013-07-07: 1 mg via ORAL
  Filled 2013-07-06: qty 1

## 2013-07-06 MED ORDER — ZOLPIDEM TARTRATE 5 MG PO TABS: 5.0000 mg | ORAL_TABLET | Freq: Every evening | ORAL | Status: DC | PRN

## 2013-07-06 MED ORDER — LEVOTHYROXINE SODIUM 200 MCG PO TABS
200.0000 ug | ORAL_TABLET | Freq: Every day | ORAL | Status: DC
Start: 2013-07-07 — End: 2013-07-07
  Administered 2013-07-07: 200 ug via ORAL
  Filled 2013-07-06 (×2): qty 1

## 2013-07-06 MED ORDER — LORAZEPAM 1 MG PO TABS
0.0000 mg | ORAL_TABLET | Freq: Four times a day (QID) | ORAL | Status: DC
  Administered 2013-07-06 – 2013-07-07 (×2): 1 mg via ORAL
  Filled 2013-07-06 (×3): qty 1

## 2013-07-06 MED ORDER — ONDANSETRON HCL 4 MG PO TABS
4.0000 mg | ORAL_TABLET | Freq: Three times a day (TID) | ORAL | Status: DC | PRN
  Administered 2013-07-06: 4 mg via ORAL
  Filled 2013-07-06: qty 1

## 2013-07-06 NOTE — ED Notes (Signed)
Pt ambulates with steady gait. In NAD. VSS. Sister at bedside. Attempted calling report to secure ED. Will call back within 10 minutes.

## 2013-07-06 NOTE — ED Notes (Signed)
First two attempt to collect urine unsuccessful.  Pt aware a sample is needed and said he would try again soon.

## 2013-07-06 NOTE — ED Notes (Signed)
Pt here for medical clearance, pt has been in a lot of pain lately because of arthritis. Pt has not been able to get medications because of insurance. Pt has recently been in rehab for med and alcohol, pt has been having difficulty in job and personal life.  Pt is now having suicidal thoughts over the past couple of day along with plan. Pt has put a shot gun to head and pulled trigger but no shells were in gun, told sister he was going to OD on pills all this happened last night. Pt is has a very flat affect, pt is tearful stating " I do not want to be here"

## 2013-07-06 NOTE — ED Notes (Signed)
Officer callled stating she was calling on behalf of a Juan Wiggins-wanting information on Therapist, art I am not able to give out any information pertaining to patient-Officer aware of HIPPA

## 2013-07-06 NOTE — ED Notes (Signed)
Bed: [facility] Expected date:  Expected time:  Means of arrival:  Comments: Tri 4

## 2013-07-06 NOTE — ED Provider Notes (Signed)
Medical screening examination/treatment/procedure(s) were performed by non-physician practitioner and as supervising physician I was immediately available for consultation/collaboration.  Neta Ehlers, MD 07/06/13 2051

## 2013-07-06 NOTE — BH Assessment (Signed)
Contacted Dr. Lacretia Leigh who said Pt was evaluated prior to his shift and to proceed with assessment.  Orpah Greek Rosana Hoes, [facility] Triage Specialist [phone]

## 2013-07-06 NOTE — ED Notes (Signed)
I stuck patient x2 for blood, unsuccessful x2. NT Edison Nasuti notified and will attempt to get blood.

## 2013-07-06 NOTE — ED Notes (Signed)
Pt is currently sleeping...attending RN notified.

## 2013-07-06 NOTE — ED Notes (Signed)
Pt sister states that as of now only contact with pt is with her(Cheryl) and father(JR) whether incoming or outgoing.

## 2013-07-06 NOTE — BH Assessment (Signed)
Attempted to assess Pt but he is too drowsy to participate in assessment. TTS will try again later.  Orpah Greek Rosana Hoes, [facility] Triage Specialist [phone]

## 2013-07-06 NOTE — ED Notes (Signed)
All belongings with family. No belongings with patient upon transfer to secure hold ED.

## 2013-07-06 NOTE — ED Notes (Signed)
Complaint of vomiting at present.  Pt reports he vomited medication.  Complaint of chronic leg pain, rates pain as 10 on pain scale.  Dr Zenia Resides notified, med orders given.

## 2013-07-06 NOTE — ED Provider Notes (Signed)
CSN: [id number]     Arrival date & time 07/06/13  1127 History  This chart was scribed for non-physician practitioner, Clayton Bibles , working with Neta Ehlers, MD, by Allena Earing ED Scribe. This patient was seen in [facility] and the patient's care was started at 12:29 PM.    Chief Complaint  Patient presents with  . Medical Clearance      The history is provided by the patient and a relative. No language interpreter was used.     HPI Comments: Juan Wiggins is a 63 y.o. male who presents to the Emergency Department complaining of depression with how his life has been in the last 3 years. He has been going through a divorce over the past 3 years, his company is in trouble, and his rheumatoid arthritis is worsening. He also states that his back pain, along L-4 and 5, have caused him a lot of pain. He states between "his back pain and his arthritis, he is in constant pain". He did not know that he was coming to the ED for his psych symptoms, he had been told t hat he was coming here so he could have his pain managed.   He states that he has had thoughts of hurting himself, "I have so much on me, its too much and he cannot handle it any longer".  He has thought about taking enough pills "that I would not wake up".   His sister says that last night their father called her and told her that the pt had been threatening to kill himself with a shotgun. She states that later in the night, her father told her that he witnessed the pt get the shotgun and put it under his chin, pulled the trigger and said " I forgot to load it".  He then tried to get his father's gun, but his father's caregiver would not let him have. Later in the night, the pt had arranged for a friend of his to bring him valium, possibly for the purpose of overdosing, but this did not happen.  "I do not want to be here anymore".  He states that he can no longer afford shots for his pain. He also states that his "major doctor will  no longer see him, he missed 3 appointments in a row and was dropped."  Pt has also recently been in rehab for alcohol and prescription drugs. He was in February 12-March 18. He states that he has not returned to taking the drugs, but he is drinking again. He states that he "drinks a quarter of a half-gallon a day of scotch" and the last time he had something to drink was this morning.  Has most recently seen Dr. Gavin Pound   Past Medical History  Diagnosis Date  . Hepatitis A   . Hypertension   . Thyroid disease   . Hemorrhoids   . Arthritis   . Hypothyroidism    Past Surgical History  Procedure Laterality Date  . Knee surgery    . Shoulder surgery    . Elbow surgery     Family History  Problem Relation Age of Onset  . Hypertension Mother   . Other Mother     varicose veins  . Hypertension Sister    History  Substance Use Topics  . Smoking status: Current Every Day Smoker -- 10 years    Types: Cigars  . Smokeless tobacco: Never Used     Comment: used cigarettes 20 years ago  .  Alcohol Use: 3.0 oz/week    5 Shots of liquor per week    Review of Systems  Respiratory: Positive for shortness of breath.   Psychiatric/Behavioral: Positive for suicidal ideas, self-injury and dysphoric mood. Negative for confusion and decreased concentration. The patient is nervous/anxious.   All other systems reviewed and are negative.     Allergies  Review of patient's allergies indicates no known allergies.  Home Medications   Prior to Admission medications   Medication Sig Start Date End Date Taking? Authorizing Provider  escitalopram (LEXAPRO) 20 MG tablet Take 20 mg by mouth daily.   Yes Historical Provider, MD  levothyroxine (SYNTHROID, LEVOTHROID) 200 MCG tablet Take 200 mcg by mouth daily before breakfast.   Yes Historical Provider, MD  ALPRAZolam (XANAX XR) 0.5 MG 24 hr tablet Take 0.5 mg by mouth every morning.     Historical Provider, MD  lisinopril (PRINIVIL,ZESTRIL)  20 MG tablet Take 0.5 tablets (10 mg total) by mouth daily. 04/15/11   Sheila Oats, MD  zolpidem (AMBIEN) 10 MG tablet Take 10 mg by mouth at bedtime.    Historical Provider, MD   BP 128/70  Pulse 94  Temp(Src) 97.4 F (36.3 C) (Oral)  Resp 16  SpO2 97% Physical Exam  Nursing note and vitals reviewed. Constitutional: He appears well-developed and well-nourished. No distress.  HENT:  Head: Normocephalic and atraumatic.  Neck: Neck supple.  Cardiovascular: Normal rate, regular rhythm and normal heart sounds.   Pulmonary/Chest: Effort normal and breath sounds normal. No respiratory distress. He has no wheezes. He has no rales.  Abdominal: Soft. He exhibits no distension and no mass. There is no tenderness. There is no rebound and no guarding.  Neurological: He is alert. He exhibits normal muscle tone.  Skin: He is not diaphoretic.  Psychiatric: His mood appears anxious. He exhibits a depressed mood. He expresses suicidal ideation. He expresses suicidal plans.    ED Course  Procedures (including critical care time)  DIAGNOSTIC STUDIES: Oxygen Saturation is 97% on RA, normal by my interpretation.    COORDINATION OF CARE:   12:43 PM-Discussed treatment plan which includes labs, UA with pt at bedside and pt agreed to plan.   Labs Review Labs Reviewed  COMPREHENSIVE METABOLIC PANEL - Abnormal; Notable for the following:    Glucose, Bld 122 (*)    Total Bilirubin 0.2 (*)    GFR calc non Af Amer 75 (*)    GFR calc Af Amer 87 (*)    All other components within normal limits  ETHANOL - Abnormal; Notable for the following:    Alcohol, Ethyl (B) 207 (*)    All other components within normal limits  URINE RAPID DRUG SCREEN (HOSP PERFORMED) - Abnormal; Notable for the following:    Benzodiazepines POSITIVE (*)    All other components within normal limits  SALICYLATE LEVEL - Abnormal; Notable for the following:    Salicylate Lvl <2.8 (*)    All other components within normal  limits  CBC WITH DIFFERENTIAL  ACETAMINOPHEN LEVEL    Imaging Review No results found.   EKG Interpretation None      MDM   Final diagnoses:  Depressed  Suicidal behavior  Alcohol abuse    Patient with many life stressor presents with SI and suicidal gestures of pulling trigger of unloaded shotgun aimed under his chin.  Has plans to overdose on medications. Tried to get friend to deliver valium last night.  Was looking for his father's gun.  Denies  having done anything to hurt himself today.  Was brought in with the understanding that we were going to address his pain.  Will need to be IVC'd and/or reassessed if he attempts to leave.  Currently cooperative.  Dr Zenia Resides made aware of patient.  Holding orders placed.  Pending psych assessment and placement.   I personally performed the services described in this documentation, which was scribed in my presence. The recorded information has been reviewed and is accurate.    Clayton Bibles, PA-C 07/06/13 1954

## 2013-07-07 ENCOUNTER — Inpatient Hospital Stay (HOSPITAL_COMMUNITY)
Admission: AD | Admit: 2013-07-07 | Discharge: 2013-07-14 | DRG: 897 | Disposition: A | Discharge status: Home / Self Care | Payer: 59 | Source: Intra-hospital | Attending: Psychiatry | Admitting: Psychiatry

## 2013-07-07 ENCOUNTER — Encounter (HOSPITAL_COMMUNITY): Payer: Self-pay | Admitting: *Deleted

## 2013-07-07 ENCOUNTER — Encounter (HOSPITAL_COMMUNITY): Payer: Self-pay | Admitting: Psychiatry

## 2013-07-07 DIAGNOSIS — F1029 Alcohol dependence with unspecified alcohol-induced disorder: Secondary | ICD-10-CM | POA: Diagnosis present

## 2013-07-07 DIAGNOSIS — F10939 Alcohol use, unspecified with withdrawal, unspecified: Principal | ICD-10-CM | POA: Diagnosis present

## 2013-07-07 DIAGNOSIS — F1020 Alcohol dependence, uncomplicated: Secondary | ICD-10-CM

## 2013-07-07 DIAGNOSIS — R45851 Suicidal ideations: Secondary | ICD-10-CM

## 2013-07-07 DIAGNOSIS — F329 Major depressive disorder, single episode, unspecified: Secondary | ICD-10-CM

## 2013-07-07 DIAGNOSIS — F1010 Alcohol abuse, uncomplicated: Secondary | ICD-10-CM

## 2013-07-07 DIAGNOSIS — I10 Essential (primary) hypertension: Secondary | ICD-10-CM | POA: Diagnosis present

## 2013-07-07 DIAGNOSIS — E039 Hypothyroidism, unspecified: Secondary | ICD-10-CM | POA: Diagnosis present

## 2013-07-07 DIAGNOSIS — Z8249 Family history of ischemic heart disease and other diseases of the circulatory system: Secondary | ICD-10-CM

## 2013-07-07 DIAGNOSIS — F17200 Nicotine dependence, unspecified, uncomplicated: Secondary | ICD-10-CM | POA: Diagnosis present

## 2013-07-07 DIAGNOSIS — F1994 Other psychoactive substance use, unspecified with psychoactive substance-induced mood disorder: Secondary | ICD-10-CM | POA: Diagnosis present

## 2013-07-07 DIAGNOSIS — F332 Major depressive disorder, recurrent severe without psychotic features: Secondary | ICD-10-CM

## 2013-07-07 DIAGNOSIS — F10239 Alcohol dependence with withdrawal, unspecified: Principal | ICD-10-CM | POA: Diagnosis present

## 2013-07-07 DIAGNOSIS — F411 Generalized anxiety disorder: Secondary | ICD-10-CM | POA: Diagnosis present

## 2013-07-07 DIAGNOSIS — G4700 Insomnia, unspecified: Secondary | ICD-10-CM | POA: Diagnosis present

## 2013-07-07 MED ORDER — THIAMINE HCL 100 MG/ML IJ SOLN: 100.0000 mg | Freq: Every day | INTRAMUSCULAR | Status: DC

## 2013-07-07 MED ORDER — LORAZEPAM 1 MG PO TABS
1.0000 mg | ORAL_TABLET | Freq: Three times a day (TID) | ORAL | Status: DC | PRN
  Administered 2013-07-07 – 2013-07-10 (×4): 1 mg via ORAL
  Filled 2013-07-07 (×4): qty 1

## 2013-07-07 MED ORDER — ZOLPIDEM TARTRATE 5 MG PO TABS
5.0000 mg | ORAL_TABLET | Freq: Every evening | ORAL | Status: DC | PRN
  Administered 2013-07-07 – 2013-07-13 (×5): 5 mg via ORAL
  Filled 2013-07-07 (×6): qty 1

## 2013-07-07 MED ORDER — NICOTINE 21 MG/24HR TD PT24
21.0000 mg | MEDICATED_PATCH | Freq: Every day | TRANSDERMAL | Status: DC
  Filled 2013-07-07 (×3): qty 1

## 2013-07-07 MED ORDER — LEVOTHYROXINE SODIUM 200 MCG PO TABS
200.0000 ug | ORAL_TABLET | Freq: Every day | ORAL | Status: DC
Start: 2013-07-08 — End: 2013-07-14
  Administered 2013-07-08 – 2013-07-14 (×7): 200 ug via ORAL
  Filled 2013-07-07: qty 1
  Filled 2013-07-07: qty 2
  Filled 2013-07-07 (×8): qty 1
  Filled 2013-07-07: qty 2

## 2013-07-07 MED ORDER — ONDANSETRON HCL 4 MG PO TABS
4.0000 mg | ORAL_TABLET | Freq: Three times a day (TID) | ORAL | Status: DC | PRN
  Administered 2013-07-07 – 2013-07-08 (×2): 4 mg via ORAL
  Filled 2013-07-07 (×2): qty 1

## 2013-07-07 MED ORDER — VITAMIN B-1 100 MG PO TABS
100.0000 mg | ORAL_TABLET | Freq: Every day | ORAL | Status: DC
  Administered 2013-07-08 – 2013-07-13 (×6): 100 mg via ORAL
  Filled 2013-07-07 (×11): qty 1

## 2013-07-07 MED ORDER — LORAZEPAM 1 MG PO TABS
0.0000 mg | ORAL_TABLET | Freq: Two times a day (BID) | ORAL | Status: AC
  Administered 2013-07-09 – 2013-07-11 (×4): 1 mg via ORAL
  Filled 2013-07-07 (×3): qty 1

## 2013-07-07 MED ORDER — LISINOPRIL 10 MG PO TABS
10.0000 mg | ORAL_TABLET | Freq: Every day | ORAL | Status: DC
  Administered 2013-07-08 – 2013-07-14 (×7): 10 mg via ORAL
  Filled 2013-07-07 (×7): qty 1
  Filled 2013-07-07: qty 2
  Filled 2013-07-07: qty 1
  Filled 2013-07-07: qty 2
  Filled 2013-07-07: qty 1

## 2013-07-07 MED ORDER — ALUM & MAG HYDROXIDE-SIMETH 200-200-20 MG/5ML PO SUSP
30.0000 mL | ORAL | Status: DC | PRN
Start: 2013-07-07 — End: 2013-07-14

## 2013-07-07 MED ORDER — IBUPROFEN 600 MG PO TABS
600.0000 mg | ORAL_TABLET | Freq: Three times a day (TID) | ORAL | Status: DC | PRN
  Administered 2013-07-07 (×2): 600 mg via ORAL
  Filled 2013-07-07 (×2): qty 1

## 2013-07-07 MED ORDER — LORAZEPAM 1 MG PO TABS
0.0000 mg | ORAL_TABLET | Freq: Four times a day (QID) | ORAL | Status: AC
  Administered 2013-07-07: 2 mg via ORAL
  Administered 2013-07-08 – 2013-07-09 (×4): 1 mg via ORAL
  Filled 2013-07-07 (×5): qty 1
  Filled 2013-07-07: qty 2

## 2013-07-07 NOTE — H&P (Signed)
HAssessment:  AXIS I: Alcohol Abuse and Major Depression, Recurrent severe  AXIS II: Deferred  AXIS III:  Past Medical History   Diagnosis  Date   .  Hepatitis A    .  Hypertension    .  Thyroid disease    .  Hemorrhoids    .  Arthritis    .  Hypothyroidism     AXIS IV: other psychosocial or environmental problems, problems related to social environment and problems with primary support group  AXIS V: 21-30 behavior considerably influenced by delusions or hallucinations OR serious impairment in judgment, communication OR inability to function in almost all areas  Plan: Recommend psychiatric Inpatient admission when bed is available.  Subjective:  Juan Wiggins is a 63 y.o. male patient admitted with alcohol dependency/detox.  HPI: 63 y.o. male Juan Wiggins is a 63 y.o. male who presents to the Emergency Department complaining of depression with how his life has been in the last 3 years. Per Clayton Bibles, PA-C, "He has been going through a divorce over the past 3 years, his company is in trouble, and his rheumatoid arthritis is worsening. He also states that his back pain, along L-4 and 5, have caused him a lot of pain. He states between "his back pain and his arthritis, he is in constant pain". He states that he has had thoughts of hurting himself, "I have so much on me, its too much and he cannot handle it any longer". He has thought about taking enough pills "that I would not wake up".  His sister says that last night their father called her and told her that the pt had been threatening to kill himself with a shotgun. She states that later in the night, her father told her that he witnessed the pt get the shotgun and put it under his chin, pulled the trigger and said " I forgot to load it". He then tried to get his father's gun, but his father's caregiver would not let him have. Later in the night, the pt had arranged for a friend of his to bring him valium, possibly  for the purpose of overdosing, but this did not happen. "I do not want to be here anymore".  He states that he can no longer afford shots for his pain. He also states that his "major doctor will no longer see him, he missed 3 appointments in a row and was dropped."  Pt has also recently been in rehab for alcohol and prescription drugs. He was in February 12-March 18. He states that he has not returned to taking the drugs, but he is drinking again. He states that he "drinks a quarter of a half-gallon a day of scotch" and the last time he had something to drink was yesterday am".  During assessment, Pt was alert and oriented x3 and cooperative. Pt denies HI, A/V or history of violence. Pt admits to SI with plan today, and says he will sign in voluntarily to [facility]. He says that this divorce has just thrown him for a loop and he can't seem to get his life back together. He says that he has multiple legal charges including DWI, some credit card irregularities at work that he says are not his fault. Pt missed a court date yesterday. He has been put on a leave of  absence from his job.  HPI Elements: Location: generalized.  Quality: acute.  Severity: severe.  Timing: constant.  Duration: few weeks.  Context: stressors.  Past Psychiatric History:  Past Medical History   Diagnosis  Date   .  Hepatitis A    .  Hypertension    .  Thyroid disease    .  Hemorrhoids    .  Arthritis    .  Hypothyroidism     reports that he has been smoking Cigars. He has never used smokeless tobacco. He reports that he drinks about 3 ounces of alcohol per week. He reports that he does not use illicit drugs.  Family History   Problem  Relation  Age of Onset   .  Hypertension  Mother    .  Other  Mother      varicose veins   .  Hypertension  Sister     Family History  Substance Abuse: Yes, Describe:  Family Supports: Yes, List: (sister, dad)  Living Arrangements: Other relatives (Dad)  Can pt return to current living  arrangement?: Yes  Abuse/Neglect [facility])  Physical Abuse: Denies  Verbal Abuse: Denies  Sexual Abuse: Denies  Allergies: No Known Allergies  ACT Assessment Complete: Yes:  Educational Status    Risk to Self:  Risk to self  Suicidal Ideation: Yes-Currently Present  Suicidal Intent: Yes-Currently Present  Is patient at risk for suicide?: Yes  Suicidal Plan?: Yes-Currently Present  Specify Current Suicidal Plan: (shoot himself)  Access to Means: Yes  Specify Access to Suicidal Means: (dad has a gun)  What has been your use of drugs/alcohol within the last 12 months?: see SA section (see )  Previous Attempts/Gestures: No  Intentional Self Injurious Behavior: None  Family Suicide History: No  Recent stressful life event(s): Divorce;Job Loss;Legal Issues  Persecutory voices/beliefs?: No  Depression: Yes  Depression Symptoms: Despondent;Insomnia;Isolating;Fatigue;Guilt;Loss of interest in usual pleasures;Feeling worthless/self pity  Substance abuse history and/or treatment for substance abuse?: Yes  Suicide prevention information given to non-admitted patients: Not applicable   Risk to Others:  Risk to Others  Homicidal Ideation: No  Thoughts of Harm to Others: No  Current Homicidal Intent: No  Current Homicidal Plan: No  Access to Homicidal Means: No  History of harm to others?: No  Assessment of Violence: None Noted  Does patient have access to weapons?: (Dad has a gun)  Criminal Charges Pending?: Yes  Describe Pending Criminal Charges: multiple--DUI, credit card fraud  Does patient have a court date: Yes  Court Date: (missed one yesterday)   Abuse:  Abuse/Neglect Assessment (Assessment to be complete while patient is alone)  Physical Abuse: Denies  Verbal Abuse: Denies  Sexual Abuse: Denies  Exploitation of patient/patient's resources: Denies  Self-Neglect: Denies   Prior Inpatient Therapy:  Prior Inpatient Therapy  Prior Inpatient Therapy: Yes  Prior Therapy Dates: (Feb  2015, Oct 2012)  Prior Therapy Facilty/Provider(s): ([facility] 2015, and [facility] in [place] 2012)  Reason for Treatment: (detox)   Prior Outpatient Therapy:  Prior Outpatient Therapy  Prior Outpatient Therapy: No   Additional Information:  Additional Information  1:1 In Past 12 Months?: No  CIRT Risk: No  Elopement Risk: No  Does patient have medical clearance?: Yes   Objective:  Blood pressure 135/81, pulse 85, temperature 98.4 F (36.9 C), temperature source Oral, resp. rate 18, SpO2 98.00%.There is no weight on file to calculate BMI.  Results for orders placed during the hospital encounter of 07/06/13 (from the  past 72 hour(s))   URINE RAPID DRUG SCREEN (HOSP PERFORMED) Status: Abnormal    Collection Time    07/06/13 12:46 PM   Result  Value  Ref Range    Opiates  NONE DETECTED  NONE DETECTED    Cocaine  NONE DETECTED  NONE DETECTED    Benzodiazepines  POSITIVE (*)  NONE DETECTED    Amphetamines  NONE DETECTED  NONE DETECTED    Tetrahydrocannabinol  NONE DETECTED  NONE DETECTED    Barbiturates  NONE DETECTED  NONE DETECTED    Comment:      DRUG SCREEN FOR MEDICAL PURPOSES     ONLY. IF CONFIRMATION IS NEEDED     FOR ANY PURPOSE, NOTIFY LAB     WITHIN 5 DAYS.         LOWEST DETECTABLE LIMITS     FOR URINE DRUG SCREEN     Drug Class Cutoff (ng/mL)     Amphetamine 1000     Barbiturate 200     Benzodiazepine 902     Tricyclics 111     Opiates 300     Cocaine 300     THC 50   CBC WITH DIFFERENTIAL Status: None    Collection Time    07/06/13 1:36 PM   Result  Value  Ref Range    WBC  6.4  4.0 - 10.5 K/uL    RBC  4.34  4.22 - 5.81 MIL/uL    Hemoglobin  14.6  13.0 - 17.0 g/dL    HCT  41.5  39.0 - 52.0 %    MCV  95.6  78.0 - 100.0 fL    MCH  33.6  26.0 - 34.0 pg    MCHC  35.2  30.0 - 36.0 g/dL    RDW  13.4  11.5 - 15.5 %    Platelets  372  150 - 400 K/uL    Neutrophils Relative %  72  43 - 77 %    Neutro Abs  4.7  1.7 - 7.7 K/uL    Lymphocytes Relative  18  12 - 46 %     Lymphs Abs  1.2  0.7 - 4.0 K/uL    Monocytes Relative  6  3 - 12 %    Monocytes Absolute  0.4  0.1 - 1.0 K/uL    Eosinophils Relative  3  0 - 5 %    Eosinophils Absolute  0.2  0.0 - 0.7 K/uL    Basophils Relative  1  0 - 1 %    Basophils Absolute  0.0  0.0 - 0.1 K/uL   COMPREHENSIVE METABOLIC PANEL Status: Abnormal    Collection Time    07/06/13 1:36 PM   Result  Value  Ref Range    Sodium  140  137 - 147 mEq/L    Potassium  4.1  3.7 - 5.3 mEq/L    Chloride  100  96 - 112 mEq/L    CO2  28  19 - 32 mEq/L    Glucose, Bld  122 (*)  70 - 99 mg/dL    BUN  9  6 - 23 mg/dL    Creatinine, Ser  1.05  0.50 - 1.35 mg/dL    Calcium  9.7  8.4 - 10.5 mg/dL    Total Protein  7.8  6.0 - 8.3 g/dL    Albumin  3.8  3.5 - 5.2 g/dL    AST  23  0 - 37 U/L  ALT  17  0 - 53 U/L    Alkaline Phosphatase  88  39 - 117 U/L    Total Bilirubin  0.2 (*)  0.3 - 1.2 mg/dL    GFR calc non Af Amer  75 (*)  >90 mL/min    GFR calc Af Amer  87 (*)  >90 mL/min    Comment:  (NOTE)     The eGFR has been calculated using the CKD EPI equation.     This calculation has not been validated in all clinical situations.     eGFR's persistently <90 mL/min signify possible Chronic Kidney     Disease.   ETHANOL Status: Abnormal    Collection Time    07/06/13 1:36 PM   Result  Value  Ref Range    Alcohol, Ethyl (B)  207 (*)  0 - 11 mg/dL    Comment:      LOWEST DETECTABLE LIMIT FOR     SERUM ALCOHOL IS 11 mg/dL     FOR MEDICAL PURPOSES ONLY   ACETAMINOPHEN LEVEL Status: None    Collection Time    07/06/13 1:36 PM   Result  Value  Ref Range    Acetaminophen (Tylenol), Serum  <15.0  10 - 30 ug/mL    Comment:      THERAPEUTIC CONCENTRATIONS VARY     SIGNIFICANTLY. A RANGE OF 10-30     ug/mL MAY BE AN EFFECTIVE     CONCENTRATION FOR MANY PATIENTS.     HOWEVER, SOME ARE BEST TREATED     AT CONCENTRATIONS OUTSIDE THIS     RANGE.     ACETAMINOPHEN CONCENTRATIONS     >150 ug/mL AT 4 HOURS AFTER     INGESTION AND >50  ug/mL AT 12     HOURS AFTER INGESTION ARE     OFTEN ASSOCIATED WITH TOXIC     REACTIONS.   SALICYLATE LEVEL Status: Abnormal    Collection Time    07/06/13 1:36 PM   Result  Value  Ref Range    Salicylate Lvl  <1.6 (*)  2.8 - 20.0 mg/dL    Labs are reviewed and are pertinent for medical issues being addressed.  Current Facility-Administered Medications   Medication  Dose  Route  Frequency  Provider  Last Rate  Last Dose   .  alum & mag hydroxide-simeth (MAALOX/MYLANTA) 200-200-20 MG/5ML suspension 30 mL  30 mL  Oral  PRN  Clayton Bibles, PA-C     .  ibuprofen (ADVIL,MOTRIN) tablet 600 mg  600 mg  Oral  Q8H PRN  Clayton Bibles, PA-C     .  levothyroxine (SYNTHROID, LEVOTHROID) tablet 200 mcg  200 mcg  Oral  QAC breakfast  Emily West, PA-C   200 mcg at 07/07/13 0825   .  lisinopril (PRINIVIL,ZESTRIL) tablet 10 mg  10 mg  Oral  Daily  Ray, PA-C   10 mg at 07/07/13 1096   .  LORazepam (ATIVAN) tablet 0-4 mg  0-4 mg  Oral  4 times per day  Clayton Bibles, PA-C   1 mg at 07/06/13 1859    Followed by   .  [START ON 07/08/2013] LORazepam (ATIVAN) tablet 0-4 mg  0-4 mg  Oral  Q12H  Clayton Bibles, PA-C     .  LORazepam (ATIVAN) tablet 1 mg  1 mg  Oral  Q8H PRN  Clayton Bibles, PA-C   1 mg at 07/07/13 0431   .  nicotine (NICODERM CQ -  dosed in mg/24 hours) patch 21 mg  21 mg  Transdermal  Daily  Emily West, PA-C     .  ondansetron [facility]) tablet 4 mg  4 mg  Oral  Q8H PRN  Clayton Bibles, PA-C   4 mg at 07/06/13 1901   .  thiamine (VITAMIN B-1) tablet 100 mg  100 mg  Oral  Daily  Emily West, PA-C   100 mg at 07/07/13 1638    Or   .  thiamine (B-1) injection 100 mg  100 mg  Intravenous  Daily  Emily West, PA-C     .  zolpidem (AMBIEN) tablet 5 mg  5 mg  Oral  QHS PRN  Clayton Bibles, PA-C      Current Outpatient Prescriptions   Medication  Sig  Dispense  Refill   .  levothyroxine (SYNTHROID, LEVOTHROID) 200 MCG tablet  Take 200 mcg by mouth daily before breakfast.     .  lisinopril (PRINIVIL,ZESTRIL) 20 MG tablet   Take 0.5 tablets (10 mg total) by mouth daily.  30 tablet  0   .  TRAZODONE HCL PO  Take 1-2 tablets by mouth at bedtime as needed (sleep). Patient said he takes 139m but [facility] has no RX      Psychiatric Specialty Exam:      Blood pressure 135/81, pulse 85, temperature 98.4 F (36.9 C), temperature source Oral, resp. rate 18, SpO2 98.00%.There is no weight on file to calculate BMI.   General Appearance: Casual   Eye Contact:: Fair   Speech: Normal Rate   Volume: Normal   Mood: Depressed   Affect: Congruent   Thought Process: Coherent   Orientation: Full (Time, Place, and Person)   Thought Content: WDL   Suicidal Thoughts: Yes. without intent/plan   Homicidal Thoughts: No   Memory: Immediate; Fair  Recent; Fair  Remote; Fair   Judgement: Poor   Insight: Fair   Psychomotor Activity: Decreased   Concentration: Fair   Recall: [employer] Knowledge:Good   Language: Good   Akathisia: No   Handed: Right   AIMS (if indicated):   Assets: Housing  Resilience  Social Support   Sleep:   Musculoskeletal:  Strength & Muscle Tone: within normal limits  Gait & Station: normal  Patient leans: N/A  Treatment Plan Summary:  Daily contact with patient to assess and evaluate symptoms and progress in treatment  Medication management for alcohol dependency/detox.  AHampton Abbot MD

## 2013-07-07 NOTE — Progress Notes (Signed)
Pt c/o anxiety and multiple medication requests for his depression and anxiety. Denies SI/HI at present and verbally contracts for safety. Pt reports depression r/t multiple stressors and alcohol dependence. "I need a tune up". Pt admitted to observation unit for evaluation and stabilization. Will monitor closely.

## 2013-07-07 NOTE — Progress Notes (Signed)
Patient ID: Juan Wiggins, male   DOB: 17-Apr-1950, 63 y.o.   MRN: [id number] 07-07-13 @ 2119 nursing admission note: pt came to [facility] voluntarily needing detox from etoh. He is drinking 1/2 gallon per day of scotch and had his last drink yesterday. He stated he is having personal problems and is depressed due to going through a divorce, experiencing alienation from his children and business problems. His ciwa on admission was 9 and he had pain from his arthritis he rated at 10/10 his legs and was nauseated as well. He was given ativan 1 mg, Zofran 4 mg and ibuprofen 600 mg on admission. He denied any si/hi/av. He has a medical hx of htn, back pain, leg pain and arthritis. He does not use any assistive device and is unsteady requiring minimal asst. from staff. Pt mood has been very labile and he has crying spells.  He was polite and very cooperative. Report was given to jennifer,rn at 1500.

## 2013-07-07 NOTE — Consult Note (Signed)
Patient seen, needs to be admitted for alcohol detox

## 2013-07-07 NOTE — Progress Notes (Signed)
Pt transferred to Juan Wiggins for further treatment. VSS. No distress noted.

## 2013-07-07 NOTE — Consult Note (Signed)
[facility] Consult   Reason for Consult:  Alcohol dependency, depression Referring Physician:  EDP  Juan Wiggins is an 63 y.o. male. Total Time spent with patient: 15 minutes  Assessment: AXIS I:  Alcohol Abuse and Major Depression, Recurrent severe AXIS II:  Deferred AXIS III:   Past Medical History  Diagnosis Date  . Hepatitis A   . Hypertension   . Thyroid disease   . Hemorrhoids   . Arthritis   . Hypothyroidism    AXIS IV:  other psychosocial or environmental problems, problems related to social environment and problems with primary support group AXIS V:  21-30 behavior considerably influenced by delusions or hallucinations OR serious impairment in judgment, communication OR inability to function in almost all areas  Plan:  Recommend psychiatric Inpatient admission when medically cleared.  Dr. Dwyane Dee assessed the patient and concurs with the findings.  Subjective:   Juan Wiggins is a 63 y.o. male patient admitted with alcohol dependency/detox.  HPI:  63 y.o. male Juan Wiggins is a 63 y.o. male who presents to the Emergency Department complaining of depression with how his life has been in the last 3 years. Per Clayton Bibles, PA-C, "He has been going through a divorce over the past 3 years, his company is in trouble, and his rheumatoid arthritis is worsening. He also states that his back pain, along L-4 and 5, have caused him a lot of pain. He states between "his back pain and his arthritis, he is in constant pain". He states that he has had thoughts of hurting himself, "I have so much on me, its too much and he cannot handle it any longer". He has thought about taking enough pills "that I would not wake up".  His sister says that last night their father called her and told her that the pt had been threatening to kill himself with a shotgun. She states that later in the night, her father told her that he witnessed the pt get the shotgun and put it under his chin,  pulled the trigger and said " I forgot to load it". He then tried to get his father's gun, but his father's caregiver would not let him have. Later in the night, the pt had arranged for a friend of his to bring him valium, possibly for the purpose of overdosing, but this did not happen. "I do not want to be here anymore".  He states that he can no longer afford shots for his pain. He also states that his "major doctor will no longer see him, he missed 3 appointments in a row and was dropped."  Pt has also recently been in rehab for alcohol and prescription drugs. He was in February 12-March 18. He states that he has not returned to taking the drugs, but he is drinking again. He states that he "drinks a quarter of a half-gallon a day of scotch" and the last time he had something to drink was yesterday am".  During assessment, Pt was alert and oriented x3 and cooperative. Pt denies HI, A/V or history of violence. Pt admits to SI with plan today, and says he will sign in voluntarily to [facility]. He says that this divorce has just thrown him for a loop and he can't seem to get his life back together. He says that he has multiple legal charges including DWI, some credit card irregularities at work that he says are not his fault. Pt missed a court date yesterday. He  has been put on a leave of absence from his job.  HPI Elements:   Location:  generalized. Quality:  acute. Severity:  severe. Timing:  constant. Duration:  few weeks. Context:  stressors.  Past Psychiatric History: Past Medical History  Diagnosis Date  . Hepatitis A   . Hypertension   . Thyroid disease   . Hemorrhoids   . Arthritis   . Hypothyroidism     reports that he has been smoking Cigars.  He has never used smokeless tobacco. He reports that he drinks about 3 ounces of alcohol per week. He reports that he does not use illicit drugs. Family History  Problem Relation Age of Onset  . Hypertension Mother   . Other Mother     varicose  veins  . Hypertension Sister    Family History Substance Abuse: Yes, Describe: Family Supports: Yes, List: (sister, dad) Living Arrangements: Other relatives (Dad) Can pt return to current living arrangement?: Yes Abuse/Neglect [facility]) Physical Abuse: Denies Verbal Abuse: Denies Sexual Abuse: Denies Allergies:  No Known Allergies  ACT Assessment Complete:  Yes:    Educational Status    Risk to Self: Risk to self Suicidal Ideation: Yes-Currently Present Suicidal Intent: Yes-Currently Present Is patient at risk for suicide?: Yes Suicidal Plan?: Yes-Currently Present Specify Current Suicidal Plan:  (shoot himself) Access to Means: Yes Specify Access to Suicidal Means:  (dad has a gun) What has been your use of drugs/alcohol within the last 12 months?: see SA section (see ) Previous Attempts/Gestures: No Intentional Self Injurious Behavior: None Family Suicide History: No Recent stressful life event(s): Divorce;Job Loss;Legal Issues Persecutory voices/beliefs?: No Depression: Yes Depression Symptoms: Despondent;Insomnia;Isolating;Fatigue;Guilt;Loss of interest in usual pleasures;Feeling worthless/self pity Substance abuse history and/or treatment for substance abuse?: Yes Suicide prevention information given to non-admitted patients: Not applicable  Risk to Others: Risk to Others Homicidal Ideation: No Thoughts of Harm to Others: No Current Homicidal Intent: No Current Homicidal Plan: No Access to Homicidal Means: No History of harm to others?: No Assessment of Violence: None Noted Does patient have access to weapons?:  (Dad has a gun) Criminal Charges Pending?: Yes Describe Pending Criminal Charges: multiple--DUI, credit card fraud Does patient have a court date: Yes Court Date:  (missed one yesterday)  Abuse: Abuse/Neglect Assessment (Assessment to be complete while patient is alone) Physical Abuse: Denies Verbal Abuse: Denies Sexual Abuse: Denies Exploitation of  patient/patient's resources: Denies Self-Neglect: Denies  Prior Inpatient Therapy: Prior Inpatient Therapy Prior Inpatient Therapy: Yes Prior Therapy Dates:  (Feb 2015, Oct 2012) Prior Therapy Facilty/Provider(s):  ([facility] in [facility] 2015, and passages in [place] 2012) Reason for Treatment:  (detox)  Prior Outpatient Therapy: Prior Outpatient Therapy Prior Outpatient Therapy: No  Additional Information: Additional Information 1:1 In Past 12 Months?: No CIRT Risk: No Elopement Risk: No Does patient have medical clearance?: Yes                  Objective: Blood pressure 135/81, pulse 85, temperature 98.4 F (36.9 C), temperature source Oral, resp. rate 18, SpO2 98.00%.There is no weight on file to calculate BMI. Results for orders placed during the hospital encounter of 07/06/13 (from the past 72 hour(s))  URINE RAPID DRUG SCREEN (HOSP PERFORMED)     Status: Abnormal   Collection Time    07/06/13 12:46 PM      Result Value Ref Range   Opiates NONE DETECTED  NONE DETECTED   Cocaine NONE DETECTED  NONE DETECTED   Benzodiazepines POSITIVE (*) NONE DETECTED  Amphetamines NONE DETECTED  NONE DETECTED   Tetrahydrocannabinol NONE DETECTED  NONE DETECTED   Barbiturates NONE DETECTED  NONE DETECTED   Comment:            DRUG SCREEN FOR MEDICAL PURPOSES     ONLY.  IF CONFIRMATION IS NEEDED     FOR ANY PURPOSE, NOTIFY LAB     WITHIN 5 DAYS.                LOWEST DETECTABLE LIMITS     FOR URINE DRUG SCREEN     Drug Class       Cutoff (ng/mL)     Amphetamine      1000     Barbiturate      200     Benzodiazepine   989     Tricyclics       211     Opiates          300     Cocaine          300     THC              50  CBC WITH DIFFERENTIAL     Status: None   Collection Time    07/06/13  1:36 PM      Result Value Ref Range   WBC 6.4  4.0 - 10.5 K/uL   RBC 4.34  4.22 - 5.81 MIL/uL   Hemoglobin 14.6  13.0 - 17.0 g/dL   HCT 41.5  39.0 - 52.0 %   MCV 95.6  78.0 - 100.0 fL   MCH 33.6   26.0 - 34.0 pg   MCHC 35.2  30.0 - 36.0 g/dL   RDW 13.4  11.5 - 15.5 %   Platelets 372  150 - 400 K/uL   Neutrophils Relative % 72  43 - 77 %   Neutro Abs 4.7  1.7 - 7.7 K/uL   Lymphocytes Relative 18  12 - 46 %   Lymphs Abs 1.2  0.7 - 4.0 K/uL   Monocytes Relative 6  3 - 12 %   Monocytes Absolute 0.4  0.1 - 1.0 K/uL   Eosinophils Relative 3  0 - 5 %   Eosinophils Absolute 0.2  0.0 - 0.7 K/uL   Basophils Relative 1  0 - 1 %   Basophils Absolute 0.0  0.0 - 0.1 K/uL  COMPREHENSIVE METABOLIC PANEL     Status: Abnormal   Collection Time    07/06/13  1:36 PM      Result Value Ref Range   Sodium 140  137 - 147 mEq/L   Potassium 4.1  3.7 - 5.3 mEq/L   Chloride 100  96 - 112 mEq/L   CO2 28  19 - 32 mEq/L   Glucose, Bld 122 (*) 70 - 99 mg/dL   BUN 9  6 - 23 mg/dL   Creatinine, Ser 1.05  0.50 - 1.35 mg/dL   Calcium 9.7  8.4 - 10.5 mg/dL   Total Protein 7.8  6.0 - 8.3 g/dL   Albumin 3.8  3.5 - 5.2 g/dL   AST 23  0 - 37 U/L   ALT 17  0 - 53 U/L   Alkaline Phosphatase 88  39 - 117 U/L   Total Bilirubin 0.2 (*) 0.3 - 1.2 mg/dL   GFR calc non Af Amer 75 (*) >90 mL/min   GFR calc Af Amer 87 (*) >90 mL/min   Comment: (NOTE)  The eGFR has been calculated using the CKD EPI equation.     This calculation has not been validated in all clinical situations.     eGFR's persistently <90 mL/min signify possible Chronic Kidney     Disease.  ETHANOL     Status: Abnormal   Collection Time    07/06/13  1:36 PM      Result Value Ref Range   Alcohol, Ethyl (B) 207 (*) 0 - 11 mg/dL   Comment:            LOWEST DETECTABLE LIMIT FOR     SERUM ALCOHOL IS 11 mg/dL     FOR MEDICAL PURPOSES ONLY  ACETAMINOPHEN LEVEL     Status: None   Collection Time    07/06/13  1:36 PM      Result Value Ref Range   Acetaminophen (Tylenol), Serum <15.0  10 - 30 ug/mL   Comment:            THERAPEUTIC CONCENTRATIONS VARY     SIGNIFICANTLY. A RANGE OF 10-30     ug/mL MAY BE AN EFFECTIVE     CONCENTRATION FOR MANY  PATIENTS.     HOWEVER, SOME ARE BEST TREATED     AT CONCENTRATIONS OUTSIDE THIS     RANGE.     ACETAMINOPHEN CONCENTRATIONS     >150 ug/mL AT 4 HOURS AFTER     INGESTION AND >50 ug/mL AT 12     HOURS AFTER INGESTION ARE     OFTEN ASSOCIATED WITH TOXIC     REACTIONS.  SALICYLATE LEVEL     Status: Abnormal   Collection Time    07/06/13  1:36 PM      Result Value Ref Range   Salicylate Lvl <8.1 (*) 2.8 - 20.0 mg/dL   Labs are reviewed and are pertinent for medical issues being addressed.  Current Facility-Administered Medications  Medication Dose Route Frequency Provider Last Rate Last Dose  . alum & mag hydroxide-simeth (MAALOX/MYLANTA) 200-200-20 MG/5ML suspension 30 mL  30 mL Oral PRN Clayton Bibles, PA-C      . ibuprofen (ADVIL,MOTRIN) tablet 600 mg  600 mg Oral Q8H PRN Clayton Bibles, PA-C      . levothyroxine (SYNTHROID, LEVOTHROID) tablet 200 mcg  200 mcg Oral QAC breakfast Emily West, PA-C   200 mcg at 07/07/13 0825  . lisinopril (PRINIVIL,ZESTRIL) tablet 10 mg  10 mg Oral Daily Wilson, PA-C   10 mg at 07/07/13 8563  . LORazepam (ATIVAN) tablet 0-4 mg  0-4 mg Oral 4 times per day Clayton Bibles, PA-C   1 mg at 07/06/13 1859   Followed by  . [START ON 07/08/2013] LORazepam (ATIVAN) tablet 0-4 mg  0-4 mg Oral Q12H Clayton Bibles, PA-C      . LORazepam (ATIVAN) tablet 1 mg  1 mg Oral Q8H PRN Clayton Bibles, PA-C   1 mg at 07/07/13 0431  . nicotine (NICODERM CQ - dosed in mg/24 hours) patch 21 mg  21 mg Transdermal Daily Emily West, PA-C      . ondansetron [facility]) tablet 4 mg  4 mg Oral Q8H PRN Clayton Bibles, PA-C   4 mg at 07/06/13 1901  . thiamine (VITAMIN B-1) tablet 100 mg  100 mg Oral Daily Emily West, PA-C   100 mg at 07/07/13 1497   Or  . thiamine (B-1) injection 100 mg  100 mg Intravenous Daily Emily West, PA-C      . zolpidem (AMBIEN) tablet 5 mg  5  mg Oral QHS PRN Clayton Bibles, PA-C       Current Outpatient Prescriptions  Medication Sig Dispense Refill  . levothyroxine (SYNTHROID,  LEVOTHROID) 200 MCG tablet Take 200 mcg by mouth daily before breakfast.      . lisinopril (PRINIVIL,ZESTRIL) 20 MG tablet Take 0.5 tablets (10 mg total) by mouth daily.  30 tablet  0  . TRAZODONE HCL PO Take 1-2 tablets by mouth at bedtime as needed (sleep). Patient said he takes 139m but [facility] has no RX        Psychiatric Specialty Exam:     Blood pressure 135/81, pulse 85, temperature 98.4 F (36.9 C), temperature source Oral, resp. rate 18, SpO2 98.00%.There is no weight on file to calculate BMI.  General Appearance: Casual  Eye Contact::  Fair  Speech:  Normal Rate  Volume:  Normal  Mood:  Depressed  Affect:  Congruent  Thought Process:  Coherent  Orientation:  Full (Time, Place, and Person)  Thought Content:  WDL  Suicidal Thoughts:  Yes.  without intent/plan  Homicidal Thoughts:  No  Memory:  Immediate;   Fair Recent;   Fair Remote;   Fair  Judgement:  Poor  Insight:  Fair  Psychomotor Activity:  Decreased  Concentration:  Fair  Recall:  [employer] Knowledge:Good  Language: Good  Akathisia:  No  Handed:  Right  AIMS (if indicated):     Assets:  Housing Resilience Social Support  Sleep:      Musculoskeletal: Strength & Muscle Tone: within normal limits Gait & Station: normal Patient leans: N/A  Treatment Plan Summary: Daily contact with patient to assess and evaluate symptoms and progress in treatment Medication management; inpatient psychiatric hospitalization for alcohol dependency/detox.  JWaylan Boga PMH-NP 07/07/2013 10:04 AM

## 2013-07-07 NOTE — BH Assessment (Signed)
Assessment Note  Juan Wiggins is an 63 y.o. male Juan Wiggins is a 63 y.o. male who presents to the Emergency Department complaining of depression with how his life has been in the last 3 years. Per Clayton Bibles, PA-C, "He has been going through a divorce over the past 3 years, his company is in trouble, and his rheumatoid arthritis is worsening. He also states that his back pain, along L-4 and 5, have caused him a lot of pain. He states between "his back pain and his arthritis, he is in constant pain". He states that he has had thoughts of hurting himself, "I have so much on me, its too much and he cannot handle it any longer". He has thought about taking enough pills "that I would not wake up".  His sister says that last night their father called her and told her that the pt had been threatening to kill himself with a shotgun. She states that later in the night, her father told her that he witnessed the pt get the shotgun and put it under his chin, pulled the trigger and said " I forgot to load it". He then tried to get his father's gun, but his father's caregiver would not let him have. Later in the night, the pt had arranged for a friend of his to bring him valium, possibly for the purpose of overdosing, but this did not happen. "I do not want to be here anymore".  He states that he can no longer afford shots for his pain. He also states that his "major doctor will no longer see him, he missed 3 appointments in a row and was dropped."  Pt has also recently been in rehab for alcohol and prescription drugs. He was in February 12-March 18. He states that he has not returned to taking the drugs, but he is drinking again. He states that he "drinks a quarter of a half-gallon a day of scotch" and the last time he had something to drink was yesterday am".  During assessment, Pt was alert and oriented x3 and cooperative.  Pt denies HI, A/V or history of violence.  Pt admits to SI with plan today, and says he will  sign in voluntarily to [facility]. He says that this divorce has just thrown him for a loop and he can't seem to get his life back together. He says that he has multiple legal charges including DWI, some credit card irregularities at work that he says are not his fault.   Pt missed a court date yesterday.  He has been put on a leave of absence from his job.  Waylan Boga, NP, recommends IP treatment.  Will contact [facility] to check bed availability.  .   Axis I: Alcohol Abuse and Major Depression, Recurrent severe,  Axis II: Deferred Axis III:  Past Medical History  Diagnosis Date  . Hepatitis A   . Hypertension   . Thyroid disease   . Hemorrhoids   . Arthritis   . Hypothyroidism    Axis IV: educational problems, problems related to legal system/crime and problems with primary support group Axis V: 11-20 some danger of hurting self or others possible OR occasionally fails to maintain minimal personal hygiene OR gross impairment in communication  Past Medical History:  Past Medical History  Diagnosis Date  . Hepatitis A   . Hypertension   . Thyroid disease   . Hemorrhoids   . Arthritis   . Hypothyroidism  Past Surgical History  Procedure Laterality Date  . Knee surgery    . Shoulder surgery    . Elbow surgery      Family History:  Family History  Problem Relation Age of Onset  . Hypertension Mother   . Other Mother     varicose veins  . Hypertension Sister     Social History:  reports that he has been smoking Cigars.  He has never used smokeless tobacco. He reports that he drinks about 3 ounces of alcohol per week. He reports that he does not use illicit drugs.  Additional Social History:  Alcohol / Drug Use Pain Medications: denies (is prescribed Oxycodone) Prescriptions: denies  Over the Counter: denies History of alcohol / drug use?: Yes Longest period of sobriety (when/how long): unkonwn Negative Consequences of Use: Financial;Legal;Personal relationships;Work /  Youth worker Withdrawal Symptoms:  (denies) Substance #1 Name of Substance 1: alcohol--Scotch 1 - Age of First Use: 17 1 - Amount (size/oz): 1/2 gallon 1 - Frequency: daily 1 - Duration: 3 weeks 1 - Last Use / Amount: yesterday am 2 tea glasses  CIWA: CIWA-Ar BP: 135/81 mmHg Pulse Rate: 85 COWS:    Allergies: No Known Allergies  Home Medications:  (Not in a hospital admission)  OB/GYN Status:  No LMP for male patient.  General Assessment Data Location of Assessment: [facility] ED Is this a Tele or Face-to-Face Assessment?: Face-to-Face Is this an Initial Assessment or a Re-assessment for this encounter?: Initial Assessment Living Arrangements: Other relatives (Dad) Can pt return to current living arrangement?: Yes Admission Status: Voluntary Is patient capable of signing voluntary admission?: Yes Transfer from: Home Referral Source: Self/Family/Friend     [place] Living Arrangements: Other relatives (Dad)  Education Status Is patient currently in school?: No  Risk to self Suicidal Ideation: Yes-Currently Present Suicidal Intent: Yes-Currently Present Is patient at risk for suicide?: Yes Suicidal Plan?: Yes-Currently Present Specify Current Suicidal Plan:  (shoot himself) Access to Means: Yes Specify Access to Suicidal Means:  (dad has a gun) What has been your use of drugs/alcohol within the last 12 months?: see SA section (see ) Previous Attempts/Gestures: No Intentional Self Injurious Behavior: None Family Suicide History: No Recent stressful life event(s): Divorce;Job Loss;Legal Issues Persecutory voices/beliefs?: No Depression: Yes Depression Symptoms: Despondent;Insomnia;Isolating;Fatigue;Guilt;Loss of interest in usual pleasures;Feeling worthless/self pity Substance abuse history and/or treatment for substance abuse?: Yes Suicide prevention information given to non-admitted patients: Not applicable  Risk to Others Homicidal Ideation: No Thoughts of  Harm to Others: No Current Homicidal Intent: No Current Homicidal Plan: No Access to Homicidal Means: No History of harm to others?: No Assessment of Violence: None Noted Does patient have access to weapons?:  (Dad has a gun) Criminal Charges Pending?: Yes Describe Pending Criminal Charges: multiple--DUI, credit card fraud Does patient have a court date: Yes Court Date:  (missed one yesterday)  Psychosis Hallucinations: None noted Delusions: None noted  Mental Status Report Appear/Hygiene: Disheveled Eye Contact: Good Motor Activity: Unremarkable Speech: Logical/coherent Level of Consciousness: Alert Mood: Depressed;Sad Affect: Depressed;Sad Anxiety Level: None Thought Processes: Coherent;Relevant Judgement: Impaired Orientation: Person;Place;Time;Situation Obsessive Compulsive Thoughts/Behaviors: None  Cognitive Functioning Concentration: Normal Memory: Recent Intact;Remote Intact IQ: Average Insight: Fair Impulse Control: Fair Appetite: Poor Weight Loss: 20 Sleep: Decreased Total Hours of Sleep:  (2-4) Vegetative Symptoms: Decreased grooming  ADLScreening [facility] Assessment Services) Patient's cognitive ability adequate to safely complete daily activities?: Yes Patient able to express need for assistance with ADLs?: Yes Independently performs ADLs?: Yes (appropriate  for developmental age)  Prior Inpatient Therapy Prior Inpatient Therapy: Yes Prior Therapy Dates:  (Feb 2015, Oct 2012) Prior Therapy Facilty/Provider(s):  ([facility] in [facility] 2015, and passages in [place] 2012) Reason for Treatment:  (detox)  Prior Outpatient Therapy Prior Outpatient Therapy: No  ADL Screening (condition at time of admission) Patient's cognitive ability adequate to safely complete daily activities?: Yes Is the patient deaf or have difficulty hearing?: No Does the patient have difficulty seeing, even when wearing glasses/contacts?: No Does the patient have difficulty concentrating, remembering,  or making decisions?: No Patient able to express need for assistance with ADLs?: Yes Does the patient have difficulty dressing or bathing?: No Independently performs ADLs?: Yes (appropriate for developmental age) Does the patient have difficulty walking or climbing stairs?: No  Home Assistive Devices/Equipment Home Assistive Devices/Equipment: None    Abuse/Neglect Assessment (Assessment to be complete while patient is alone) Physical Abuse: Denies Verbal Abuse: Denies Sexual Abuse: Denies Exploitation of patient/patient's resources: Denies Self-Neglect: Denies Values / Beliefs Cultural Requests During Hospitalization: None Spiritual Requests During Hospitalization: None Consults Spiritual Care Consult Needed: No Social Work Consult Needed: No      Additional Information 1:1 In Past 12 Months?: No CIRT Risk: No Elopement Risk: No Does patient have medical clearance?: Yes     Disposition:  Disposition Initial Assessment Completed for this Encounter: Yes Disposition of Patient: Inpatient treatment program Type of inpatient treatment program: Adult  On Site Evaluation by:   Reviewed with Physician:    Sheliah Hatch 07/07/2013 8:34 AM

## 2013-07-07 NOTE — BH Assessment (Signed)
[place] Assessment Progress Note  Pt accepted to [facility] Obs bed 5 and signed voluntary paperwork. Pt will be sent by [facility] transportation.

## 2013-07-07 NOTE — Progress Notes (Signed)
Patient ID: Juan Wiggins, male   DOB: 1950-04-19, 63 y.o.   MRN: [id number]  Martin Majestic to bedside to complete patient's assessment. Patient difficult to arouse. Unable to complete assessment at this time. Staff informed to notify extender if patient should wake up. Shift report updated by Curlene Dolphin to reflect patient requires assessment later this morning.  Serena Colonel, FNP-BC

## 2013-07-08 DIAGNOSIS — F1994 Other psychoactive substance use, unspecified with psychoactive substance-induced mood disorder: Secondary | ICD-10-CM

## 2013-07-08 DIAGNOSIS — F1020 Alcohol dependence, uncomplicated: Secondary | ICD-10-CM

## 2013-07-08 DIAGNOSIS — R45851 Suicidal ideations: Secondary | ICD-10-CM

## 2013-07-08 MED ORDER — QUETIAPINE FUMARATE 200 MG PO TABS
200.0000 mg | ORAL_TABLET | Freq: Every day | ORAL | Status: DC
  Administered 2013-07-08: 200 mg via ORAL
  Filled 2013-07-08 (×4): qty 1

## 2013-07-08 MED ORDER — MIRTAZAPINE 15 MG PO TABS
15.0000 mg | ORAL_TABLET | Freq: Every day | ORAL | Status: DC
  Administered 2013-07-08 – 2013-07-09 (×2): 15 mg via ORAL
  Filled 2013-07-08 (×5): qty 1

## 2013-07-08 MED ORDER — IBUPROFEN 600 MG PO TABS
600.0000 mg | ORAL_TABLET | Freq: Three times a day (TID) | ORAL | Status: DC | PRN
  Administered 2013-07-08: 600 mg via ORAL
  Filled 2013-07-08: qty 1

## 2013-07-08 NOTE — [facility] Group Notes (Signed)
[place] LCSW Group Therapy Note  07/08/2013 1:15 PM  Type of Therapy and Topic:  Group Therapy: Avoiding Self-Sabotaging and Enabling Behaviors  Participation Level:  Moderate  Mood: Depressed  Description of Group:     Learn how to identify obstacles, self-sabotaging and enabling behaviors, what are they, why do we do them and what needs do these behaviors meet? Discuss unhealthy relationships and how to have positive healthy boundaries with those that sabotage and enable. Explore aspects of self-sabotage and enabling in yourself and how to limit these self-destructive behaviors in everyday life.  Therapeutic Goals: 1. Patient will identify one obstacle that relates to self-sabotage and enabling behaviors 2. Patient will identify one personal self-sabotaging or enabling behavior they did prior to admission 3. Patient able to establish a plan to change the above identified behavior they did prior to admission:  4. Patient will demonstrate ability to communicate their needs through discussion and/or role plays.   Summary of Patient Progress: The main focus of today's process group was to explain what "self-sabotage" means and use Motivational Interviewing to discuss what benefits, negative or positive, were involved in a self-identified self-sabotaging behavior. We then talked about reasons the patient may want to change the behavior and his/her current desire to change. A scaling question was used to help patient look at where they are now in motivation for change, from 1 to 10 (lowest to highest motivation).  [name] came in to group late yet was attentive and participated when called upon. [name] reports he "could go either way as without my one support there is no reason for me to want to improve."  [name] could not identify anything he is looking forward to in the next 3 months.    Therapeutic Modalities:   Cognitive Behavioral Therapy Person-Centered Therapy Motivational  Interviewing   Sheilah Pigeon, LCSW

## 2013-07-08 NOTE — Progress Notes (Signed)
D:  Patient up and about on the unit.  Sometimes he uses the wheelchair and sometimes he is ambulatory with a steady gait.  He rates depression and hopelessness at 10 today.  He also states he is having some thoughts of suicide but agrees to seek out staff if feeling unsafe.   A:  Medications given as prescribed.  Offered support and encouragement.  Encouraged patient to be up and attending all groups. R:  Cooperative with staff.  Interacting well with peers.  Affect is flat/blunted.  Tolerating medications well.  Safety is maintained.

## 2013-07-08 NOTE — Progress Notes (Signed)
Pt direct admit from obs unit. Pt requesting detox. Pt stated he was SI-contracts for safety. Pt stated he has no reason to live anymore. Pt stated he needs to have surgery on both his knees. Pt stated he needs a wheelchair to help him get around.

## 2013-07-08 NOTE — H&P (Signed)
Psychiatric Admission Assessment Adult  Patient Identification:  Juan Wiggins  Date of Evaluation:  07/08/2013  Chief Complaint:  MDD substance abuse  History of Present Illness: This is a 63 year old Caucasian male, he reports, "My sister took me to the ED yesterday because I wanted to die. I'm just a failure. Married x 31 years, going through separation and divorce since the last 3 years. Could not deal with it, started self destructive behavior. Drank a lot of alcohol up to a half gallon of scotch daily. Got several DUIs. Several court dates for the DUIs hanging over my head right now. I don't want to go to jail. I rather die than to go to jail.  I have lost everything.  I love my wife, she has not talked to me in 3 years. Been severely depressed x 3 years. Tried Lexapro, Xanax, Depakote, none helped me. Been through counseling sessions at [facility] and [facility], I'm still depressed. My mind runs and races. I have anxiety attacks, chronic pain from arthritis. I cry all the time. I have lost my appetite for food. I just want to lay down and die".  Elements:  Location:  Alcoholism, Severe depreesion. Quality:  Suicidal thoughts, increased alcohol consumption, . Severity:  Severe. Timing:  Been going on x 3 years. Duration:  Chronic. Context:  Separated x 3 years, lost everything, several DUIs, don't want to go to jail, I want to die".  Associated Signs/Synptoms:  Depression Symptoms:  depressed mood, anhedonia, feelings of worthlessness/guilt, hopelessness, anxiety,  (Hypo) Manic Symptoms:  Impulsivity, Labiality of Mood, Racing thoughts  Anxiety Symptoms:  Excessive Worry, Panic Symptoms,  Psychotic Symptoms:  Hallucinations: None  PTSD Symptoms: Had a traumatic exposure:  Denies  Total Time spent with patient: 1 hour  Psychiatric Specialty Exam: Physical Exam  Constitutional: He is oriented to person, place, and time. He appears  well-developed.  HENT:  Head: Normocephalic.  Eyes: Pupils are equal, round, and reactive to light.  Neck: Normal range of motion.  Cardiovascular: Normal rate.   Respiratory: Effort normal.  GI: Soft.  Musculoskeletal: Normal range of motion.  Suffers from arthritic pain  Neurological: He is alert and oriented to person, place, and time.  Skin: Skin is dry.  Psychiatric: His speech is normal and behavior is normal. His mood appears anxious. His affect is labile. Cognition and memory are normal. He expresses impulsivity. He exhibits a depressed mood. He expresses suicidal ideation. He expresses no suicidal plans.    Review of Systems  Constitutional: Positive for malaise/fatigue.  HENT: Negative.   Eyes: Negative.   Respiratory: Negative.   Cardiovascular: Negative.   Gastrointestinal: Negative.   Genitourinary: Negative.   Musculoskeletal: Positive for back pain, joint pain and myalgias.  Skin: Negative.   Neurological: Positive for weakness.  Endo/Heme/Allergies: Negative.   Psychiatric/Behavioral: Positive for depression, suicidal ideas and substance abuse (Alcoholism). Negative for hallucinations and memory loss. The patient is nervous/anxious and has insomnia.     Blood pressure 149/99, pulse 89, temperature 97.8 F (36.6 C), temperature source Oral, resp. rate 20, height 6' (1.829 m), weight 96.163 kg (212 lb), SpO2 97.00%.Body mass index is 28.75 kg/(m^2).  General Appearance: Fairly Groomed  Engineer, water::  Fair  Speech:  Clear and Coherent  Volume:  Normal  Mood:  Anxious, Depressed, Hopeless and Worthless  Affect:  Congruent, Flat and Tearful  Thought Process:  Coherent and Intact  Orientation:  Full (Time, Place, and Person)  Thought Content:  Rumination  Suicidal Thoughts:  Yes.  without intent/plan  Homicidal Thoughts:  No  Memory:  Immediate;   Good Recent;   Good Remote;   Good  Judgement:  Fair  Insight:  Fair  Psychomotor Activity:  Restlessness and  Tremor  Concentration:  Poor  Recall:  [place] of Knowledge:Poor  Language: Fair  Akathisia:  No  Handed:  Right  AIMS (if indicated):     Assets:  Desire for Improvement  Sleep:      Musculoskeletal: Strength & Muscle Tone: within normal limits Gait & Station: unsteady Patient leans: N/A  Past Psychiatric History: Diagnosis: Alcohol dependence, Substance induced mood disorder  Hospitalizations: [facility] adult unit  [facility]: [facility]  Substance Abuse Care: [facility]  Self-Mutilation: NA  Suicidal Attempts: Denies attempts, admits thoughts  Violent Behaviors: NA   Past Medical History:   Past Medical History  Diagnosis Date  . Hepatitis A   . Hypertension   . Thyroid disease   . Hemorrhoids   . Arthritis   . Hypothyroidism    Cardiac History:  HTN  Allergies:  No Known Allergies  PTA Medications: Prescriptions prior to admission  Medication Sig Dispense Refill  . levothyroxine (SYNTHROID, LEVOTHROID) 200 MCG tablet Take 200 mcg by mouth daily before breakfast.      . lisinopril (PRINIVIL,ZESTRIL) 20 MG tablet Take 0.5 tablets (10 mg total) by mouth daily.  30 tablet  0  . TRAZODONE HCL PO Take 1-2 tablets by mouth at bedtime as needed (sleep). Patient said he takes 115m but [facility] has no RX        Previous Psychotropic Medications:  Medication/Dose  See medication lists               Substance Abuse History in the last 12 months:  yes  Consequences of Substance Abuse: Medical Consequences:  Liver damage, Possible death by overdose Legal Consequences:  Arrests, jail time, Loss of driving privilege. Family Consequences:  Family discord, divorce and or separation.  Social History:  reports that he has been smoking.  He has never used smokeless tobacco. He reports that he drinks alcohol. He reports that he does not use illicit drugs. Additional Social History: Pain Medications:  oxycodone Prescriptions: lisinopril, synthroid and trazadone.  Over the Counter: denies.  History of alcohol / drug use?: Yes Longest period of sobriety (when/how long): 37 days Negative Consequences of Use: Personal relationships;Legal;Financial Withdrawal Symptoms: Nausea / Vomiting;Irritability;Fever / Chills;Diarrhea;Agitation;Weakness;Tremors;Sweats Name of Substance 1: eoth-scotch 1 - Age of First Use: 17 1 - Amount (size/oz): 1/2 gallon  1 - Frequency: daily  1 - Duration: 3 weeks 1 - Last Use / Amount: 07-06-13 [place] Family Members: "My 921year old father"  Marital Status:  Separated  Children: 3  Sons: 1  Daughters: 2  Relationships: Separated  Education:  HApple ComputerGCharity fundraiserProblems/Performance: Completed high school  Religious Beliefs/Practices: NA  History of Abuse (Emotional/Phsycial/Sexual): Denies  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  Legal History: Pending court dates for DUI charges  Hobbies/Interests: NA  Family History:   Family History  Problem Relation Age of Onset  . Hypertension Mother   . Other Mother     varicose veins  . Hypertension Sister     Results for orders placed during the hospital encounter of 07/06/13 (from the past 72  hour(s))  URINE RAPID DRUG SCREEN (HOSP PERFORMED)     Status: Abnormal   Collection Time    07/06/13 12:46 PM      Result Value Ref Range   Opiates NONE DETECTED  NONE DETECTED   Cocaine NONE DETECTED  NONE DETECTED   Benzodiazepines POSITIVE (*) NONE DETECTED   Amphetamines NONE DETECTED  NONE DETECTED   Tetrahydrocannabinol NONE DETECTED  NONE DETECTED   Barbiturates NONE DETECTED  NONE DETECTED   Comment:            DRUG SCREEN FOR MEDICAL PURPOSES     ONLY.  IF CONFIRMATION IS NEEDED     FOR ANY PURPOSE, NOTIFY LAB     WITHIN 5 DAYS.                LOWEST DETECTABLE LIMITS     FOR URINE  DRUG SCREEN     Drug Class       Cutoff (ng/mL)     Amphetamine      1000     Barbiturate      200     Benzodiazepine   932     Tricyclics       355     Opiates          300     Cocaine          300     THC              50  CBC WITH DIFFERENTIAL     Status: None   Collection Time    07/06/13  1:36 PM      Result Value Ref Range   WBC 6.4  4.0 - 10.5 K/uL   RBC 4.34  4.22 - 5.81 MIL/uL   Hemoglobin 14.6  13.0 - 17.0 g/dL   HCT 41.5  39.0 - 52.0 %   MCV 95.6  78.0 - 100.0 fL   MCH 33.6  26.0 - 34.0 pg   MCHC 35.2  30.0 - 36.0 g/dL   RDW 13.4  11.5 - 15.5 %   Platelets 372  150 - 400 K/uL   Neutrophils Relative % 72  43 - 77 %   Neutro Abs 4.7  1.7 - 7.7 K/uL   Lymphocytes Relative 18  12 - 46 %   Lymphs Abs 1.2  0.7 - 4.0 K/uL   Monocytes Relative 6  3 - 12 %   Monocytes Absolute 0.4  0.1 - 1.0 K/uL   Eosinophils Relative 3  0 - 5 %   Eosinophils Absolute 0.2  0.0 - 0.7 K/uL   Basophils Relative 1  0 - 1 %   Basophils Absolute 0.0  0.0 - 0.1 K/uL  COMPREHENSIVE METABOLIC PANEL     Status: Abnormal   Collection Time    07/06/13  1:36 PM      Result Value Ref Range   Sodium 140  137 - 147 mEq/L   Potassium 4.1  3.7 - 5.3 mEq/L   Chloride 100  96 - 112 mEq/L   CO2 28  19 - 32 mEq/L   Glucose, Bld 122 (*) 70 - 99 mg/dL   BUN 9  6 - 23 mg/dL   Creatinine, Ser 1.05  0.50 - 1.35 mg/dL   Calcium 9.7  8.4 - 10.5 mg/dL   Total Protein 7.8  6.0 - 8.3 g/dL   Albumin 3.8  3.5 - 5.2 g/dL   AST 23  0 - 37  U/L   ALT 17  0 - 53 U/L   Alkaline Phosphatase 88  39 - 117 U/L   Total Bilirubin 0.2 (*) 0.3 - 1.2 mg/dL   GFR calc non Af Amer 75 (*) >90 mL/min   GFR calc Af Amer 87 (*) >90 mL/min   Comment: (NOTE)     The eGFR has been calculated using the CKD EPI equation.     This calculation has not been validated in all clinical situations.     eGFR's persistently <90 mL/min signify possible Chronic Kidney     Disease.  ETHANOL     Status: Abnormal   Collection Time    07/06/13   1:36 PM      Result Value Ref Range   Alcohol, Ethyl (B) 207 (*) 0 - 11 mg/dL   Comment:            LOWEST DETECTABLE LIMIT FOR     SERUM ALCOHOL IS 11 mg/dL     FOR MEDICAL PURPOSES ONLY  ACETAMINOPHEN LEVEL     Status: None   Collection Time    07/06/13  1:36 PM      Result Value Ref Range   Acetaminophen (Tylenol), Serum <15.0  10 - 30 ug/mL   Comment:            THERAPEUTIC CONCENTRATIONS VARY     SIGNIFICANTLY. A RANGE OF 10-30     ug/mL MAY BE AN EFFECTIVE     CONCENTRATION FOR MANY PATIENTS.     HOWEVER, SOME ARE BEST TREATED     AT CONCENTRATIONS OUTSIDE THIS     RANGE.     ACETAMINOPHEN CONCENTRATIONS     >150 ug/mL AT 4 HOURS AFTER     INGESTION AND >50 ug/mL AT 12     HOURS AFTER INGESTION ARE     OFTEN ASSOCIATED WITH TOXIC     REACTIONS.  SALICYLATE LEVEL     Status: Abnormal   Collection Time    07/06/13  1:36 PM      Result Value Ref Range   Salicylate Lvl <2.5 (*) 2.8 - 20.0 mg/dL   Psychological Evaluations:  Assessment:   DSM5: Schizophrenia Disorders:  NA Obsessive-Compulsive Disorders:  NA Trauma-Stressor Disorders:  NA Substance/Addictive Disorders:  Alcohol Related Disorder - Severe (303.90) Depressive Disorders:  Substance induced mood disorder  AXIS I:  Substance Induced Mood Disorder and Alcohol dependence AXIS II:  Deferred AXIS III:   Past Medical History  Diagnosis Date  . Hepatitis A   . Hypertension   . Thyroid disease   . Hemorrhoids   . Arthritis   . Hypothyroidism    AXIS IV:  other psychosocial or environmental problems and Marital separation AXIS V:  11-20 some danger of hurting self or others possible OR occasionally fails to maintain minimal personal hygiene OR gross impairment in communication  Treatment Plan/Recommendations: 1. Admit for crisis management and stabilization, estimated length of stay 3-5 days.  2. Medication management to reduce current symptoms to base line and improve the patient's overall level of  functioning; Initiate Seroquel 200 mg Q bedtime for mood control, Remeron 15 mg Q bedtime for sleep/depression   3. Treat health problems as indicated.  4. Develop treatment plan to decrease risk of relapse upon discharge and the need for readmission.  5. Psycho-social education regarding relapse prevention and self care.  6. Health care follow up as needed for medical problems.  7. Review, reconcile, and reinstate any pertinent home medications  for other health issues where appropriate. 8. Call for consults with hospitalist for any additional specialty patient care services as needed.  Treatment Plan Summary: Daily contact with patient to assess and evaluate symptoms and progress in treatment Medication management  Current Medications:  Current Facility-Administered Medications  Medication Dose Route Frequency Provider Last Rate Last Dose  . alum & mag hydroxide-simeth (MAALOX/MYLANTA) 200-200-20 MG/5ML suspension 30 mL  30 mL Oral PRN Hampton Abbot, MD      . ibuprofen (ADVIL,MOTRIN) tablet 600 mg  600 mg Oral Q8H PRN Hampton Abbot, MD   600 mg at 07/07/13 2204  . levothyroxine (SYNTHROID, LEVOTHROID) tablet 200 mcg  200 mcg Oral QAC breakfast Hampton Abbot, MD   200 mcg at 07/08/13 [phone]  . lisinopril (PRINIVIL,ZESTRIL) tablet 10 mg  10 mg Oral Daily Hampton Abbot, MD   10 mg at 07/08/13 0909  . LORazepam (ATIVAN) tablet 0-4 mg  0-4 mg Oral 4 times per day Hampton Abbot, MD   1 mg at 07/08/13 1093   Followed by  . [START ON 07/09/2013] LORazepam (ATIVAN) tablet 0-4 mg  0-4 mg Oral Q12H Hampton Abbot, MD      . LORazepam (ATIVAN) tablet 1 mg  1 mg Oral Q8H PRN Hampton Abbot, MD   1 mg at 07/07/13 2204  . nicotine (NICODERM CQ - dosed in mg/24 hours) patch 21 mg  21 mg Transdermal Daily Hampton Abbot, MD      . ondansetron [facility]) tablet 4 mg  4 mg Oral Q8H PRN Hampton Abbot, MD   4 mg at 07/08/13 2355  . thiamine (VITAMIN B-1) tablet 100 mg  100 mg Oral Daily Hampton Abbot, MD   100 mg at  07/08/13 7322   Or  . thiamine (B-1) injection 100 mg  100 mg Intravenous Daily Hampton Abbot, MD      . zolpidem (AMBIEN) tablet 5 mg  5 mg Oral QHS PRN Lurena Nida, NP   5 mg at 07/07/13 2204    Observation Level/Precautions:  15 minute checks  Laboratory:  Per ED  Psychotherapy: Group sessions    Medications: See medication lists    Consultations:  As needed  Discharge Concerns:  Mood stability  Estimated LOS: 5-7 days  Other:     I certify that inpatient services furnished can reasonably be expected to improve the patient's condition.   Encarnacion Slates, pMHNP-BC 5/16/201510:03 AM  I have personally seen the patient and agreed with the findings and involved in the treatment plan. Berniece Andreas, MD

## 2013-07-08 NOTE — Progress Notes (Signed)
The focus of this group is to help patients review their daily goal of treatment and discuss progress on daily workbooks. Pt attended the evening group session and responded to all discussion prompts from the [place]. Pt shared that today was a good day on the unit, the highlight of which was going outside to the [facility]. "My knees aren't that strong, but I try to make an effort to walk places when I can. I walked to the courtyard and had a good time out there not being in this wheelchair." Pt's only additional request from Nursing Staff this evening was to shave, which was taken care of following group. Pt's affect was appropriate.

## 2013-07-08 NOTE — [facility] Suicide Risk Assessment (Signed)
   Nursing information obtained from:    Demographic factors:    Current Mental Status:    Loss Factors:    Historical Factors:    Risk Reduction Factors:    Total Time spent with patient: 45 minutes  CLINICAL FACTORS:   Depression:   Anhedonia Comorbid alcohol abuse/dependence Hopelessness Impulsivity Insomnia Alcohol/Substance Abuse/Dependencies More than one psychiatric diagnosis  Psychiatric Specialty Exam: Physical Exam  ROS  Blood pressure 149/99, pulse 89, temperature 97.8 F (36.6 C), temperature source Oral, resp. rate 20, height 6' (1.829 m), weight 212 lb (96.163 kg), SpO2 97.00%.Body mass index is 28.75 kg/(m^2).  General Appearance: Casual  Eye Contact::  Fair  Speech:  Slow  Volume:  Decreased  Mood:  Depressed, Dysphoric and Hopeless  Affect:  Constricted and Depressed  Thought Process:  Goal Directed and Logical  Orientation:  Full (Time, Place, and Person)  Thought Content:  Rumination  Suicidal Thoughts:  Yes.  with intent/plan  Homicidal Thoughts:  No  Memory:  Immediate;   Fair Recent;   Fair Remote;   Fair  Judgement:  Fair  Insight:  Lacking  Psychomotor Activity:  Decreased  Concentration:  Fair  Recall:  [employer] of Knowledge:Fair  Language: Fair  Akathisia:  No  Handed:  Right  AIMS (if indicated):     Assets:  Communication Skills Desire for Improvement  Sleep:      Musculoskeletal: Strength & Muscle Tone: within normal limits Gait & Station: unsteady, Patient is using the wheelchair Patient leans: N/A  COGNITIVE FEATURES THAT CONTRIBUTE TO RISK:  Loss of executive function Polarized thinking Thought constriction (tunnel vision)    SUICIDE RISK:   Severe:  Frequent, intense, and enduring suicidal ideation, specific plan, no subjective intent, but some objective markers of intent (i.e., choice of lethal method), the method is accessible, some limited preparatory behavior, evidence of impaired self-control, severe  dysphoria/symptomatology, multiple risk factors present, and few if any protective factors, particularly a lack of social support.  PLAN OF CARE:  I certify that inpatient services furnished can reasonably be expected to improve the patient's condition.  Keyonna Comunale T Adelae Yodice 07/08/2013, 1:51 PM

## 2013-07-08 NOTE — Progress Notes (Signed)
The focus of this group is to educate the patient on the purpose and policies of crisis stabilization and provide a format to answer questions about their admission.  The group details unit policies and expectations of patients while admitted.  Patient attended group.  He was engaged and respectful of others.  Daily packet was handed out and explained.

## 2013-07-09 MED ORDER — QUETIAPINE FUMARATE 50 MG PO TABS
250.0000 mg | ORAL_TABLET | Freq: Every day | ORAL | Status: DC
  Administered 2013-07-09: 250 mg via ORAL
  Filled 2013-07-09 (×5): qty 1

## 2013-07-09 MED ORDER — GABAPENTIN 300 MG PO CAPS
300.0000 mg | ORAL_CAPSULE | Freq: Three times a day (TID) | ORAL | Status: DC
  Administered 2013-07-09 – 2013-07-11 (×6): 300 mg via ORAL
  Filled 2013-07-09 (×13): qty 1

## 2013-07-09 NOTE — Progress Notes (Signed)
D:  Patient's self inventory sheet, patient has poor sleep, poor appetite, low energy level, poor attention span.  Rated depression and hopeless 10, anxiety 5.  Denied withdrawals.  Denied SI.   Pain from waist down.   Worst pain #9.  Tries to withdraw from people.  No discharge plans.  Unsure about meds after discharge.  Woke up last night , slept 6 hours. A:  Medications administered per MD orders.  Emotional support and encouragement given patient. R:  Denied SI an HI.  Denied A/V hallucinations.  Will continue to monitor patient for safety with 15 minute checks.  Safety maintained. Ambulates with wheelchair.

## 2013-07-09 NOTE — Progress Notes (Signed)
D: Pt denies SI/HI/AVH. Pt is pleasant and cooperative. Pt concerned about pain. Pt focused on the fact that he says he needs 2 knee replacements.   A: Pt was offered support and encouragement. Pt was given scheduled medications. Pt was encourage to attend groups. Q 15 minute checks were done for safety.   R:Pt attends groups and interacts well with peers and staff. Pt is taking medication. Pt receptive to treatment and safety maintained on unit.

## 2013-07-09 NOTE — Progress Notes (Signed)
[facility] MD Progress Note  07/09/2013 4:16 PM Juan Wiggins  MRN:  [id number]  Subjective:  Juan Wiggins reports, "My soul is sore and wounded. I miss my wife, the life I had. I can't keep the thought of everything that I lost out of my head. I don't know how to start dealing with it and or accepting the facts. I'm not sleeping at night. I'm in pain everyday, my arthritis is bad. Am suicidal? It is not just the thought of it, it is is just that I don't want to be here any more".  Diagnosis:   DSM5: Schizophrenia Disorders:  NA Obsessive-Compulsive Disorders:  NA Trauma-Stressor Disorders:  NA Substance/Addictive Disorders:  Alcohol Related Disorder - Severe (303.90) Depressive Disorders:  Substance induced mood disorder Total Time spent with patient: 35 minutes  Axis I: Substance Induced Mood Disorder and Alcohol dependence Axis II: Deferred Axis III:  Past Medical History  Diagnosis Date  . Hepatitis A   . Hypertension   . Thyroid disease   . Hemorrhoids   . Arthritis   . Hypothyroidism    Axis IV: other psychosocial or environmental problems and Alcoholism, marital separation Axis V: 41-50 serious symptoms  ADL's:  Intact  Sleep: Poor  Appetite:  Good  Suicidal Ideation:  Plan:  Denies Intent:  Denies Means:  Denies Homicidal Ideation:  Plan:  Denies Intent:  Denies Means:  Denies AEB (as evidenced by):  Psychiatric Specialty Exam: Physical Exam  Psychiatric: His speech is normal and behavior is normal. Judgment and thought content normal. His mood appears anxious. Cognition and memory are normal. He exhibits a depressed mood.    Review of Systems  Constitutional: Positive for malaise/fatigue and diaphoresis.  HENT: Negative.   Eyes: Negative.   Respiratory: Negative.   Cardiovascular: Negative.   Gastrointestinal: Negative.   Genitourinary: Negative.   Musculoskeletal: Positive for back pain, joint pain and myalgias.  Skin: Negative.   Neurological: Positive for  tremors and weakness.  Endo/Heme/Allergies: Negative.   Psychiatric/Behavioral: Positive for depression, suicidal ideas (fleeting thoughts) and substance abuse (Alcoholism). Negative for hallucinations and memory loss. The patient is nervous/anxious and has insomnia.     Blood pressure 128/77, pulse 97, temperature 97.8 F (36.6 C), temperature source Oral, resp. rate 18, height 6' (1.829 m), weight 96.163 kg (212 lb), SpO2 97.00%.Body mass index is 28.75 kg/(m^2).  General Appearance: Casual  Eye Contact::  Fair  Speech:  Clear and Coherent  Volume:  Normal  Mood:  Anxious, Depressed and tearful  Affect:  Depressed, Flat and Tearful  Thought Process:  Coherent and Intact  Orientation:  Full (Time, Place, and Person)  Thought Content:  Rumination, racing thoughts  Suicidal Thoughts:  Yes.  without intent/plan  Homicidal Thoughts:  No  Memory:  Immediate;   Good Recent;   Good Remote;   Good  Judgement:  Impaired  Insight:  Shallow  Psychomotor Activity:  Restlessness and Tremor  Concentration:  Poor  Recall:  Juan Wiggins of Knowledge:Fair  Language: Good  Akathisia:  No  Handed:  Right  AIMS (if indicated):     Assets:  Desire for Improvement  Sleep:  Number of Hours: 5.75   Musculoskeletal: Strength & Muscle Tone: within normal limits Gait & Station: normal Patient leans: N/A  Current Medications: Current Facility-Administered Medications  Medication Dose Route Frequency Provider Last Rate Last Dose  . alum & mag hydroxide-simeth (MAALOX/MYLANTA) 200-200-20 MG/5ML suspension 30 mL  30 mL Oral PRN Hampton Abbot, MD      .  ibuprofen (ADVIL,MOTRIN) tablet 600 mg  600 mg Oral Q8H PRN Encarnacion Slates, NP   600 mg at 07/08/13 2157  . levothyroxine (SYNTHROID, LEVOTHROID) tablet 200 mcg  200 mcg Oral QAC breakfast Hampton Abbot, MD   200 mcg at 07/09/13 0848  . lisinopril (PRINIVIL,ZESTRIL) tablet 10 mg  10 mg Oral Daily Hampton Abbot, MD   10 mg at 07/09/13 0849  . LORazepam  (ATIVAN) tablet 0-4 mg  0-4 mg Oral 4 times per day Hampton Abbot, MD   1 mg at 07/09/13 1213   Followed by  . LORazepam (ATIVAN) tablet 0-4 mg  0-4 mg Oral Q12H Hampton Abbot, MD      . LORazepam (ATIVAN) tablet 1 mg  1 mg Oral Q8H PRN Hampton Abbot, MD   1 mg at 07/08/13 2201  . mirtazapine (REMERON) tablet 15 mg  15 mg Oral QHS Encarnacion Slates, NP   15 mg at 07/08/13 2157  . ondansetron (ZOFRAN) tablet 4 mg  4 mg Oral Q8H PRN Hampton Abbot, MD   4 mg at 07/08/13 1610  . QUEtiapine (SEROQUEL) tablet 200 mg  200 mg Oral QHS Encarnacion Slates, NP   200 mg at 07/08/13 2157  . thiamine (VITAMIN B-1) tablet 100 mg  100 mg Oral Daily Hampton Abbot, MD   100 mg at 07/09/13 9604   Or  . thiamine (B-1) injection 100 mg  100 mg Intravenous Daily Hampton Abbot, MD      . zolpidem (AMBIEN) tablet 5 mg  5 mg Oral QHS PRN Lurena Nida, NP   5 mg at 07/07/13 2204    Lab Results: No results found for this or any previous visit (from the past 72 hour(s)).  Physical Findings: AIMS: Facial and Oral Movements Muscles of Facial Expression: None, normal Lips and Perioral Area: None, normal Jaw: None, normal Tongue: None, normal,Extremity Movements Upper (arms, wrists, hands, fingers): None, normal Lower (legs, knees, ankles, toes): None, normal, Trunk Movements Neck, shoulders, hips: None, normal, Overall Severity Severity of abnormal movements (highest score from questions above): None, normal Incapacitation due to abnormal movements: None, normal Patient's awareness of abnormal movements (rate only patient's report): No Awareness, Dental Status Current problems with teeth and/or dentures?: No Does patient usually wear dentures?: No  CIWA:  CIWA-Ar Total: 2 COWS:  COWS Total Score: 2  Treatment Plan Summary: Daily contact with patient to assess and evaluate symptoms and progress in treatment Medication management  Plan: 1. Continue crisis management and stabilization.  2. Medication management: Continue  Ativan taper for alcohol detox, Mirtazapine 15 mg Q bedtime for depression/sleep, increase Seroquel from 200 mg to 250 mg Q hs for mood control, add Neurontin 300 mg TID for agitation/pain.  3. Encouraged patient to attend groups and participate in group counseling sessions and activities.   4. Continue current treatment plan.  5. Address health issues: Vitals reviewed and stable.   Medical Decision Making Problem Points:  Review of last therapy session (1) and Review of psycho-social stressors (1) Data Points:  Review of medication regiment & side effects (2) Review of new medications or change in dosage (2)  I certify that inpatient services furnished can reasonably be expected to improve the patient's condition.   Encarnacion Slates, PMHNP-BC 07/09/2013, 4:16 PM  I agreed with the findings, treatment and disposition plan of this patient. Berniece Andreas, MD

## 2013-07-09 NOTE — BHH Group Notes (Signed)
[place] LCSW Group Therapy Note   07/09/2013  1:15 to 2:05 PM  Type of Therapy and Topic: Group Therapy: Feelings Around Returning Home & Establishing a Supportive Framework and Activity to Identify signs of Improvement or Decompensation   Participation Level: Actively listening  Mood: Depressed  Description of Group:  Patients first processed thoughts and feelings about up coming discharge. These included fears of upcoming changes, lack of change, new living environments, judgements and expectations from others and overall stigma of MH issues. We then discussed what is a supportive framework? What does it look like feel like and how do I discern it from and unhealthy non-supportive network? Learn how to cope when supports are not helpful and don't support you. Discuss what to do when your family/friends are not supportive.   Therapeutic Goals Addressed in Processing Group:  1. Patient will identify one healthy supportive network that they can use at discharge. 2. Patient will identify one factor of a supportive framework and how to tell it from an unhealthy network. 3. Patient able to identify one coping skill to use when they do not have positive supports from others. 4. Patient will demonstrate ability to communicate their needs through discussion and/or role plays.  Summary of Patient Progress:  Pt was mostly quiet during group session as other patients processed their anxiety about discharge and described healthy supports. He reported he had no supports when asked about supports and repeatedly suggested "if I can't have previous (wife of 31 years and children who are all avoiding contact) supports than noting will help." Patient reports that his success will depend on court outcome. When asked to consider the possibility he can survive and be okay whether he goes to jail or not he was resistant to concept. Others in group offered support.   Sheilah Pigeon, LCSW
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
## 2013-07-09 NOTE — Progress Notes (Signed)
Psychoeducational Group Note  Date:  07/09/2013 Time:  1015  Group Topic/Focus:  Making Healthy Choices:   The focus of this group is to help patients identify negative/unhealthy choices they were using prior to admission and identify positive/healthier coping strategies to replace them upon discharge.  Participation Level:  Active  Participation Quality:  Appropriate  Affect:  Appropriate  Cognitive:  Oriented  Insight:  Improving  Engagement in Group:  Engaged  Additional Comments:  Pt engaged in the group   Bryson Dames A 07/09/2013

## 2013-07-09 NOTE — Progress Notes (Signed)
Psychoeducational Group Note  Date: 07/09/2013 Time  0930  Group Topic/Focus:  Gratefulness:  The focus of this group is to help patients identify what two things they are most grateful for in their lives. What helps ground them and to center them on their work to their recovery.  Participation Level:  Did not attend Paulino Rily

## 2013-07-10 MED ORDER — QUETIAPINE FUMARATE 50 MG PO TABS
350.0000 mg | ORAL_TABLET | Freq: Every day | ORAL | Status: DC
  Administered 2013-07-10: 350 mg via ORAL
  Filled 2013-07-10 (×5): qty 1

## 2013-07-10 MED ORDER — CELECOXIB 400 MG PO CAPS
400.0000 mg | ORAL_CAPSULE | Freq: Every day | ORAL | Status: DC
Start: 2013-07-10 — End: 2013-07-14
  Administered 2013-07-10 – 2013-07-14 (×5): 400 mg via ORAL
  Filled 2013-07-10 (×7): qty 1

## 2013-07-10 MED ORDER — MIRTAZAPINE 7.5 MG PO TABS
7.5000 mg | ORAL_TABLET | Freq: Every day | ORAL | Status: DC
  Administered 2013-07-10: 7.5 mg via ORAL
  Filled 2013-07-10 (×3): qty 1

## 2013-07-10 NOTE — BHH Group Notes (Signed)
[facility] LCSW Group Therapy  07/10/2013   1:15 PM   Type of Therapy:  Group Therapy  Participation Level:  Active  Participation Quality:  Attentive, Sharing and Supportive  Affect:  Depressed and Flat  Cognitive:  Alert and Oriented  Insight:  Developing/Improving and Engaged  Engagement in Therapy:  Developing/Improving and Engaged  Modes of Intervention:  Clarification, Confrontation, Discussion, Education, Exploration, Limit-setting, Orientation, Problem-solving, Rapport Building, Art therapist, Socialization and Support  Summary of Progress/Problems: Pt identified obstacles faced currently and processed barriers involved in overcoming these obstacles. Pt identified steps necessary for overcoming these obstacles and explored motivation (internal and external) for facing these difficulties head on. Pt further identified one area of concern in their lives and chose a goal to focus on for today.  Pt shared that his biggest obstacle is himself.  Pt explained that he has had obstacles in his life for the last 3.5 years, including mental and physical health issues.  Pt states that he feels being here is what was needed, as he was overwhelmed with life.  Pt actively participated and was engaged in group discussion.    Regan Lemming, LCSW 07/10/2013  3:24 PM
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
## 2013-07-10 NOTE — Tx Team (Signed)
Interdisciplinary Treatment Plan Update (Adult)  Date: 07/10/2013  Time Reviewed:  9:45 AM  Progress in Treatment: Attending groups: Yes Participating in groups:  Yes Taking medication as prescribed:  Yes Tolerating medication:  Yes Family/Significant othe contact made: CSW assessing Patient understands diagnosis:  Yes Discussing patient identified problems/goals with staff:  Yes Medical problems stabilized or resolved:  Yes Denies suicidal/homicidal ideation: Yes Issues/concerns per patient self-inventory:  Yes Other:  New problem(s) identified: N/A  Discharge Plan or Barriers: CSW is assessing for appropriate referrals.    Reason for Continuation of Hospitalization: Anxiety Depression Medication Stabilization  Comments: N/A  Estimated length of stay: 3-5 days  For review of initial/current patient goals, please see plan of care.  Attendees: Patient:     Family:     Physician:     Nursing:   Gaylan Gerold, RN 07/10/2013 10:24 AM   Clinical Social Worker:  Regan Lemming, LCSW 07/10/2013 10:24 AM   Other: Nena Polio, [place] 07/10/2013 10:24 AM   Other:  Grayland Ormond, RN 07/10/2013 10:24 AM   Other:  Thurnell Garbe, RN 07/10/2013 10:24 AM   Other:  Lars Pinks, UR case manager 07/10/2013 10:25 AM   Other:    Other:    Other:    Other:    Other:    Other:     Scribe for Treatment Team:   Ane Payment, 07/10/2013 10:24 AM

## 2013-07-10 NOTE — BHH Group Notes (Signed)
Adult Psychoeducational Group Note  Date:  07/10/2013 Time:  9:57 PM  Group Topic/Focus:  Wrap-Up Group:   The focus of this group is to help patients review their daily goal of treatment and discuss progress on daily workbooks.  Participation Level:  Active  Participation Quality:  Appropriate, Sharing and Supportive  Affect:  Appropriate  Cognitive:  Alert, Appropriate and Oriented  Insight: Appropriate and Good  Engagement in Group:  Engaged and Supportive  Modes of Intervention:  Clarification, Problem-solving and Support  Additional Comments:  [place] shared with the group that he received some bad news about court on today.  He provided clarification by telling everyone that he was not going to allow that to keep him down.  He stated what has happen has happened and he will look forward to a better tomorrow.    Aditi Rovira 07/10/2013, 9:57 PM

## 2013-07-10 NOTE — Progress Notes (Signed)
Patient ID: Juan Wiggins, male   DOB: 1950/11/18, 63 y.o.   MRN: [id number] [facility] MD Progress Note  07/10/2013 4:53 PM DAYSHON ROBACK  MRN:  [id number]  Subjective:  Patient states he is having passive SI and just doesn't care if lives or dies. He states his pain is a problem as well. He also reports poor sleep and states that the Trazodone has never worked. He has taken Ambien in the past. He states that he is still having racing thoughts and can't seem to shut his brain off to sleep. He denies HI or AVH. And he reports no symptoms of withdrawal.  He says his depression is a 9/10 and he has no anxiety. Diagnosis:   DSM5: Schizophrenia Disorders:  NA Obsessive-Compulsive Disorders:  NA Trauma-Stressor Disorders:  NA Substance/Addictive Disorders:  Alcohol Related Disorder - Severe (303.90) Depressive Disorders:  Substance induced mood disorder Total Time spent with patient: 35 minutes  Axis I: Substance Induced Mood Disorder and Alcohol dependence Axis II: Deferred Axis III:  Past Medical History  Diagnosis Date  . Hepatitis A   . Hypertension   . Thyroid disease   . Hemorrhoids   . Arthritis   . Hypothyroidism    Axis IV: other psychosocial or environmental problems and Alcoholism, marital separation Axis V: 41-50 serious symptoms  ADL's:  Intact  Sleep: Poor  Appetite:  Good  Suicidal Ideation:  Plan:  Denies Intent:  Denies Means:  Denies Homicidal Ideation:  Plan:  Denies Intent:  Denies Means:  Denies AEB (as evidenced by):  Psychiatric Specialty Exam: Physical Exam  Psychiatric: His speech is normal and behavior is normal. Judgment and thought content normal. His mood appears anxious. Cognition and memory are normal. He exhibits a depressed mood.    Review of Systems  Constitutional: Positive for malaise/fatigue and diaphoresis.  HENT: Negative.   Eyes: Negative.   Respiratory: Negative.   Cardiovascular: Negative.   Gastrointestinal: Negative.    Genitourinary: Negative.   Musculoskeletal: Positive for back pain, joint pain and myalgias.  Skin: Negative.   Neurological: Positive for tremors and weakness.  Endo/Heme/Allergies: Negative.   Psychiatric/Behavioral: Positive for depression, suicidal ideas (fleeting thoughts) and substance abuse (Alcoholism). Negative for hallucinations and memory loss. The patient is nervous/anxious and has insomnia.     Blood pressure 112/74, pulse 108, temperature 97.9 F (36.6 C), temperature source Oral, resp. rate 18, height 6' (1.829 m), weight 96.163 kg (212 lb), SpO2 97.00%.Body mass index is 28.75 kg/(m^2).  General Appearance: Casual  Eye Contact::  Fair  Speech:  Clear and Coherent  Volume:  Normal  Mood:  Anxious, Depressed and tearful  Affect:  Depressed, Flat and Tearful  Thought Process:  Coherent and Intact  Orientation:  Full (Time, Place, and Person)  Thought Content:  Rumination, racing thoughts  Suicidal Thoughts:  Yes.  without intent/plan  Homicidal Thoughts:  No  Memory:  Immediate;   Good Recent;   Good Remote;   Good  Judgement:  Impaired  Insight:  Shallow  Psychomotor Activity:  Restlessness and Tremor  Concentration:  Poor  Recall:  [place] of Knowledge:Fair  Language: Good  Akathisia:  No  Handed:  Right  AIMS (if indicated):     Assets:  Desire for Improvement  Sleep:  Number of Hours: 6.5   Musculoskeletal: Strength & Muscle Tone: within normal limits Gait & Station: normal Patient leans: N/A  Current Medications: Current Facility-Administered Medications  Medication Dose Route Frequency Provider Last Rate Last  Dose  . alum & mag hydroxide-simeth (MAALOX/MYLANTA) 200-200-20 MG/5ML suspension 30 mL  30 mL Oral PRN Hampton Abbot, MD      . gabapentin (NEURONTIN) capsule 300 mg  300 mg Oral TID Encarnacion Slates, NP   300 mg at 07/10/13 1200  . ibuprofen (ADVIL,MOTRIN) tablet 600 mg  600 mg Oral Q8H PRN Encarnacion Slates, NP   600 mg at 07/08/13 2157  .  levothyroxine (SYNTHROID, LEVOTHROID) tablet 200 mcg  200 mcg Oral QAC breakfast Hampton Abbot, MD   200 mcg at 07/10/13 0902  . lisinopril (PRINIVIL,ZESTRIL) tablet 10 mg  10 mg Oral Daily Hampton Abbot, MD   10 mg at 07/10/13 0903  . LORazepam (ATIVAN) tablet 0-4 mg  0-4 mg Oral Q12H Hampton Abbot, MD   1 mg at 07/10/13 0800  . LORazepam (ATIVAN) tablet 1 mg  1 mg Oral Q8H PRN Hampton Abbot, MD   1 mg at 07/08/13 2201  . mirtazapine (REMERON) tablet 15 mg  15 mg Oral QHS Encarnacion Slates, NP   15 mg at 07/09/13 2114  . ondansetron (ZOFRAN) tablet 4 mg  4 mg Oral Q8H PRN Hampton Abbot, MD   4 mg at 07/08/13 5053  . QUEtiapine (SEROQUEL) tablet 250 mg  250 mg Oral QHS Encarnacion Slates, NP   250 mg at 07/09/13 2114  . thiamine (VITAMIN B-1) tablet 100 mg  100 mg Oral Daily Hampton Abbot, MD   100 mg at 07/10/13 9767   Or  . thiamine (B-1) injection 100 mg  100 mg Intravenous Daily Hampton Abbot, MD      . zolpidem (AMBIEN) tablet 5 mg  5 mg Oral QHS PRN Lurena Nida, NP   5 mg at 07/07/13 2204    Lab Results: No results found for this or any previous visit (from the past 48 hour(s)).  Physical Findings: AIMS: Facial and Oral Movements Muscles of Facial Expression: None, normal Lips and Perioral Area: None, normal Jaw: None, normal Tongue: None, normal,Extremity Movements Upper (arms, wrists, hands, fingers): None, normal Lower (legs, knees, ankles, toes): None, normal, Trunk Movements Neck, shoulders, hips: None, normal, Overall Severity Severity of abnormal movements (highest score from questions above): None, normal Incapacitation due to abnormal movements: None, normal Patient's awareness of abnormal movements (rate only patient's report): No Awareness, Dental Status Current problems with teeth and/or dentures?: No Does patient usually wear dentures?: No  CIWA:  CIWA-Ar Total: 2 COWS:  COWS Total Score: 2  Treatment Plan Summary: Daily contact with patient to assess and evaluate  symptoms and progress in treatment Medication management  Plan: 1. Continue crisis management and stabilization.  2. Medication management: Continue Ativan taper for alcohol detox,      Decrease Mirtazapine to 7mg  Q bedtime for depression/sleep,      Increase Seroquel 300 mg Q hs for mood control, continue Neurontin 300 mg TID for agitation/pain.      Add Celebrex 400mg  po qd for RA pain.     Consider addition of Lithium if continues to have significant depression. 3. Encouraged patient to attend groups and participate in group counseling sessions and activities.   4. Continue current treatment plan.  5. Address health issues: Vitals reviewed and stable.  6. Disposition in process. Medical Decision Making Problem Points:  Review of last therapy session (1) and Review of psycho-social stressors (1) Data Points:  Review of medication regiment & side effects (2) Review of new medications or change in dosage (2)  I certify that inpatient services furnished can reasonably be expected to improve the patient's condition.  Marlane Hatcher. [place] RPAC 5:03 PM 07/10/2013 Agree with assessment and plan Geralyn Flash A. Sabra Heck, M.D.

## 2013-07-10 NOTE — BHH Group Notes (Signed)
[facility] LCSW Aftercare Discharge Planning Group Note   07/10/2013 8:45 AM  Participation Quality:  Alert, Appropriate and Oriented  Mood/Affect:  Irritable, Depressed  Depression Rating:  9  Anxiety Rating:  0  Thoughts of Suicide:  Pt endorses SI - states that he wants to lay down and die, denies HI  Will you contract for safety?   Yes  Current AVH:  Pt denies  Plan for Discharge/Comments:  Pt attended discharge planning group and actively participated in group.  CSW provided pt with today's workbook.  Pt reports not feeling well today as he is suicidal.  Pt states that he plans to return home in [place] with dad and doesn't have any outpatient providers.  CSW will assess for appropriate referrals.  No further needs voiced by pt at this time.    Transportation Means: Pt reports access to transportation - sister will pick pt up  Supports: Family is supportive  Regan Lemming, LCSW 07/10/2013 10:02 AM
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
## 2013-07-10 NOTE — Progress Notes (Signed)
Adult Psychoeducational Group Note  Date:  07/10/2013 Time:  10:00am Group Topic/Focus:  Wellness Toolbox:   The focus of this group is to discuss various aspects of wellness, balancing those aspects and exploring ways to increase the ability to experience wellness.  Patients will create a wellness toolbox for use upon discharge.  Participation Level:  Active  Participation Quality:  Appropriate and Attentive  Affect:  Appropriate  Cognitive:  Alert and Appropriate  Insight: Appropriate  Engagement in Group:  Engaged  Modes of Intervention:  Discussion and Education  Additional Comments:   Pt attended and participated in group. Discussion was on Wellness and what it means to you. Pt stated wellness means good true feelings mind body and soul within self.  Gailen Shelter 07/10/2013, 6:38 PM

## 2013-07-10 NOTE — Progress Notes (Signed)
D: Patient denies SI/HI/AVH. Patient rates hopelessness as 10,  depression as 10, and anxiety as 10.  Patient affect is anxious and depressed. Mood is anxious and depressed.  Pt states, "I do not feel anything is getting better.  I just started neurontin.  I hope it helps."  Patient did NOT attend evening group. Patient visible on the milieu. No distress noted. A: Support and encouragement offered. Scheduled medications given to pt. Q 15 min checks continued for patient safety. R: Patient receptive. Patient remains safe on the unit.

## 2013-07-11 MED ORDER — GABAPENTIN 400 MG PO CAPS
400.0000 mg | ORAL_CAPSULE | Freq: Three times a day (TID) | ORAL | Status: DC
  Administered 2013-07-11 – 2013-07-14 (×9): 400 mg via ORAL
  Filled 2013-07-11 (×15): qty 1

## 2013-07-11 MED ORDER — OLANZAPINE-FLUOXETINE HCL 12-50 MG PO CAPS
1.0000 | ORAL_CAPSULE | Freq: Every day | ORAL | Status: DC
  Administered 2013-07-11 – 2013-07-13 (×3): 1 via ORAL
  Filled 2013-07-11 (×5): qty 1

## 2013-07-11 NOTE — Progress Notes (Signed)
The focus of this group is to educate the patient on the purpose and policies of crisis stabilization and provide a format to answer questions about their admission.  The group details unit policies and expectations of patients while admitted.  Patient attended 0900 nurse education orientation group this morning.  Patient actively participated, appropriate affect, alert, appropriate insight and engagement.  Today patient will work on 3 goals for discharge.  

## 2013-07-11 NOTE — Progress Notes (Addendum)
D:  Patient's self inventory sheet, patient has fair sleep, improving appetite, low energy level, improving attention span.  Rated depression and hopeless #8.  Has experienced chilling in past 24 hours.  SI off/on, contracts for safety.  Has experienced pain in past 24 hours.  Pain goal 8, worst pain 8.  "Court causing me to stay down."  No discharge plans.  No problems with meds after discharge. A:  Medications administered per MD orders.  Emotional support and encouragement given patient. R:  Denied HI.  SI off/on, contracts for safety.  Will continue to monitor patient for safety with 15 minute checks.  Safety maintained.  Patient talked with MD this afternoon, has been walking in hallway and dayroom.  Also patient has been talking to male patient.
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
## 2013-07-11 NOTE — BHH Group Notes (Signed)
[place] LCSW Group Therapy      Feelings About Diagnosis 1:15 - 2:30 PM         07/11/2013    Type of Therapy:  Group Therapy  Participation Level:  Active  Participation Quality:  Appropriate  Affect:  Appropriate  Cognitive:  Alert and Appropriate  Insight:  Developing/Improving and Engaged  Engagement in Therapy:  Developing/Improving and Engaged  Modes of Intervention:  Discussion, Education, Exploration, Problem-Solving, Rapport Building, Support  Summary of Progress/Problems:  Patient actively participated in group. Patient discussed past and present diagnosis and the effects it has had on  life.  Patient talked about family and society being judgmental and the stigma associated with having a mental health diagnosis. He shared he is okay with the diagnosis of depression but feels so devastated by the loss of his family and business over the past three years.  Concha Pyo 07/11/2013

## 2013-07-11 NOTE — Progress Notes (Signed)
Recreation Therapy Notes  Animal-Assisted Activity/Therapy (AAA/T) Program Checklist/Progress Notes Patient Eligibility Criteria Checklist & Daily Group note for Rec Tx Intervention  Date: 05.19.2015 Time: 2:45pm Location: [address]    AAA/T Program Assumption of Risk Form signed by Patient/ or Parent Legal Guardian yes  Patient is free of allergies or sever asthma yes  Patient reports no fear of animals yes  Patient reports no history of cruelty to animals yes   Patient understands his/her participation is voluntary yes  Patient washes hands before animal contact yes  Patient washes hands after animal contact yes  Behavioral Response: Appropriate   Education: Hand Washing, Appropriate Animal Interaction   Education Outcome: Acknowledges understanding  Clinical Observations/Feedback: Patient engaged with therapy dog and peers appropriately.   Laureen Ochs Latiesha Harada, LRT/CTRS  Lane Hacker 07/11/2013 4:32 PM

## 2013-07-11 NOTE — Progress Notes (Signed)
Adult Psychoeducational Group Note  Date:  07/11/2013 Time:  10:35 PM  Group Topic/Focus:  Wrap-Up Group:   The focus of this group is to help patients review their daily goal of treatment and discuss progress on daily workbooks.  Participation Level:  Active  Participation Quality:  Appropriate  Affect:  Appropriate  Cognitive:  Appropriate  Insight: Appropriate  Engagement in Group:  Engaged  Modes of Intervention:  Discussion  Additional Comments:  Pt was present for wrap up group. He stated that he learned from a group today to think about the choices he makes and to remember that he has a choice. He stated that it is important that he has a choice and that he will act on his choices. He would not elaborate on this. He said that he was happy that he got medications added and he is looking forward to seeing if they work for him. He also stated that his goal is to work out his legal issues. He was cooperative and bright. He interacted with his peers all evening before going to bed.   Raye Wiens A Anayia Eugene 07/11/2013, 10:35 PM

## 2013-07-11 NOTE — Progress Notes (Signed)
D: Patient in the hallway using his wheelchair on approach.  Patient appears depressed and states his day was ok.  Patient still admits to being hopeless due to his many stressors.  Patient states to the writer, "I am going to give up."  Patient states he is not going to be able to deal with all the stressors in his life.  Patient states, "I cannot go to jail."  Patient states, "I have two options, I can end it all or I can go on the run."  Patient states, "this time I am going to do it right."  Patient very vague and would not elaborate after this. Patient passive SI but verbally contracts for safety while in the hospital.  Patient denies HI and denies AVH. A: Staff to monitor Q 15 mins for safety.  Encouragement and support offered.  Scheduled medications administered per orders. Ambien administered prn for sleep. R: Patient remains safe on the unit.  Patient attended group tonight.  Patient taking administered medications.  Patient visible on the unit and interacting with peers.

## 2013-07-11 NOTE — Progress Notes (Signed)
Patient ID: Juan Wiggins, male   DOB: Dec 11, 1950, 63 y.o.   MRN: [id number] Patient ID: Juan Wiggins, male   DOB: [birth year], 63 y.o.   MRN: [id number] [facility] MD Progress Note  07/11/2013 4:22 PM Juan Wiggins  MRN:  [id number]  Subjective:  Patient was admitted with depression, suicidal ideation and alcohol dependence. He has several psychosocial problems mostly related to his separation, divorce process, financial, lack on communication with his wife and three grown up children. He stated that he would plan to end his life with taking pills instead of going to Jail/Prison as he learned from court hearing. He has dad who was 51 years old and sister who was supportive to him. He has severe arthritis pain and needs medication and may wants to go to for surgery in near future. He also reports poor sleep and states that his medication is not working. He is still having racing thoughts and can't seem to shut his brain off to sleep. He denies HI or AVH. He reports no symptoms of alcohol withdrawal instead of intoxicated on arrival. He was relapsed about 12 days ago.  He says his depression is a 8/10 and he has anxiety 7/10. Diagnosis:   DSM5: Schizophrenia Disorders:  NA Obsessive-Compulsive Disorders:  NA Trauma-Stressor Disorders:  NA Substance/Addictive Disorders:  Alcohol Related Disorder - Severe (303.90) Depressive Disorders:  Substance induced mood disorder Total Time spent with patient: 35 minutes  Axis I: Substance Induced Mood Disorder and Alcohol dependence, Major depressive disorder, recurent Axis II: Deferred Axis III:  Past Medical History  Diagnosis Date  . Hepatitis A   . Hypertension   . Thyroid disease   . Hemorrhoids   . Arthritis   . Hypothyroidism    Axis IV: other psychosocial or environmental problems and Alcoholism, marital separation Axis V: 41-50 serious symptoms  ADL's:  Intact  Sleep: Poor  Appetite:  Good  Suicidal Ideation:  Plan:  Denies Intent:   Denies Means:  Denies Homicidal Ideation:  Plan:  Denies Intent:  Denies Means:  Denies AEB (as evidenced by):  Psychiatric Specialty Exam: Physical Exam  Psychiatric: His speech is normal and behavior is normal. Judgment and thought content normal. His mood appears anxious. Cognition and memory are normal. He exhibits a depressed mood.    Review of Systems  Constitutional: Positive for malaise/fatigue and diaphoresis.  HENT: Negative.   Eyes: Negative.   Respiratory: Negative.   Cardiovascular: Negative.   Gastrointestinal: Negative.   Genitourinary: Negative.   Musculoskeletal: Positive for back pain, joint pain and myalgias.  Skin: Negative.   Neurological: Positive for tremors and weakness.  Endo/Heme/Allergies: Negative.   Psychiatric/Behavioral: Positive for depression, suicidal ideas (fleeting thoughts) and substance abuse (Alcoholism). Negative for hallucinations and memory loss. The patient is nervous/anxious and has insomnia.     Blood pressure 97/65, pulse 96, temperature 97.8 F (36.6 C), temperature source Oral, resp. rate 20, height 6' (1.829 m), weight 96.163 kg (212 lb), SpO2 97.00%.Body mass index is 28.75 kg/(m^2).  General Appearance: Casual  Eye Contact::  Fair  Speech:  Clear and Coherent  Volume:  Normal  Mood:  Anxious, Depressed and tearful  Affect:  Depressed, Flat and Tearful  Thought Process:  Coherent and Intact  Orientation:  Full (Time, Place, and Person)  Thought Content:  Rumination, racing thoughts  Suicidal Thoughts:  Yes.  without intent/plan  Homicidal Thoughts:  No  Memory:  Immediate;   Good Recent;   Good Remote;   Good  Judgement:  Impaired  Insight:  Shallow  Psychomotor Activity:  Restlessness and Tremor  Concentration:  Poor  Recall:  [place]  Language: Good  Akathisia:  No  Handed:  Right  AIMS (if indicated):     Assets:  Desire for Improvement  Sleep:  Number of Hours: 5.25    Musculoskeletal: Strength & Muscle Tone: within normal limits Gait & Station: normal Patient leans: N/A  Current Medications: Current Facility-Administered Medications  Medication Dose Route Frequency Provider Last Rate Last Dose  . alum & mag hydroxide-simeth (MAALOX/MYLANTA) 200-200-20 MG/5ML suspension 30 mL  30 mL Oral PRN Hampton Abbot, MD      . gabapentin (NEURONTIN) capsule 300 mg  300 mg Oral TID Encarnacion Slates, NP   300 mg at 07/10/13 1200  . ibuprofen (ADVIL,MOTRIN) tablet 600 mg  600 mg Oral Q8H PRN Encarnacion Slates, NP   600 mg at 07/08/13 2157  . levothyroxine (SYNTHROID, LEVOTHROID) tablet 200 mcg  200 mcg Oral QAC breakfast Hampton Abbot, MD   200 mcg at 07/10/13 0902  . lisinopril (PRINIVIL,ZESTRIL) tablet 10 mg  10 mg Oral Daily Hampton Abbot, MD   10 mg at 07/10/13 0903  . LORazepam (ATIVAN) tablet 0-4 mg  0-4 mg Oral Q12H Hampton Abbot, MD   1 mg at 07/10/13 0800  . LORazepam (ATIVAN) tablet 1 mg  1 mg Oral Q8H PRN Hampton Abbot, MD   1 mg at 07/08/13 2201  . mirtazapine (REMERON) tablet 15 mg  15 mg Oral QHS Encarnacion Slates, NP   15 mg at 07/09/13 2114  . ondansetron (ZOFRAN) tablet 4 mg  4 mg Oral Q8H PRN Hampton Abbot, MD   4 mg at 07/08/13 2355  . QUEtiapine (SEROQUEL) tablet 250 mg  250 mg Oral QHS Encarnacion Slates, NP   250 mg at 07/09/13 2114  . thiamine (VITAMIN B-1) tablet 100 mg  100 mg Oral Daily Hampton Abbot, MD   100 mg at 07/10/13 7322   Or  . thiamine (B-1) injection 100 mg  100 mg Intravenous Daily Hampton Abbot, MD      . zolpidem (AMBIEN) tablet 5 mg  5 mg Oral QHS PRN Lurena Nida, NP   5 mg at 07/07/13 2204    Lab Results: No results found for this or any previous visit (from the past 48 hour(s)).  Physical Findings: AIMS: Facial and Oral Movements Muscles of Facial Expression: None, normal Lips and Perioral Area: None, normal Jaw: None, normal Tongue: None, normal,Extremity Movements Upper (arms, wrists, hands, fingers): None, normal Lower (legs,  knees, ankles, toes): None, normal, Trunk Movements Neck, shoulders, hips: None, normal, Overall Severity Severity of abnormal movements (highest score from questions above): None, normal Incapacitation due to abnormal movements: None, normal Patient's awareness of abnormal movements (rate only patient's report): No Awareness, Dental Status Current problems with teeth and/or dentures?: No Does patient usually wear dentures?: No  CIWA:  CIWA-Ar Total: 2 COWS:  COWS Total Score: 2  Treatment Plan Summary: Daily contact with patient to assess and evaluate symptoms and progress in treatment Medication management  Plan: 1. Continue crisis management and stabilization.  2. Medication management: Continue Ativan taper for alcohol detox,  Discontinue Mirtazapine and 300 mg Q hs for mood control Increase Neurontin 400 mg TID for agitation/pain.  Continue Celebrex 400mg  po qd for RA pain. Start Symbyax 12/50 mg PO Qhs for depression with racing thoughts. Consider addition of Lithium if continues to have  significant depression. 3. Encouraged patient to attend groups and participate in group counseling sessions and activities.   4. Continue current treatment plan.  5. Address health issues: Vitals reviewed and stable.  6. Disposition in process.  Medical Decision Making Problem Points:  Review of last therapy session (1) and Review of psycho-social stressors (1) Data Points:  Review of medication regiment & side effects (2) Review of new medications or change in dosage (2)  I certify that inpatient services furnished can reasonably be expected to improve the patient's condition.   Parke Simmers Stancil Deisher 07/11/2013 4:24 PM

## 2013-07-12 NOTE — BHH Suicide Risk Assessment (Signed)
[place] INPATIENT:  Family/Significant Other Suicide Prevention Education  Suicide Prevention Education:  Education Completed; Juan Wiggins, Sister, [phone]; has been identified by the patient as the family member/significant other with whom the patient will be residing, and identified as the person(s) who will aid the patient in the event of a mental health crisis (suicidal ideations/suicide attempt).  With written consent from the patient, the family member/significant other has been provided the following suicide prevention education, prior to the and/or following the discharge of the patient.  The suicide prevention education provided includes the following:  Suicide risk factors  Suicide prevention and interventions  National Suicide Hotline telephone number  [facility] assessment telephone number  [facility] Emergency Assistance [place] and/or Residential Mobile Crisis Unit telephone number  Request made of family/significant other to:  Remove weapons (e.g., guns, rifles, knives), all items previously/currently identified as safety concern.   Sister advised patient weapons have been secured.  Remove drugs/medications (over-the-counter, prescriptions, illicit drugs), all items previously/currently identified as a safety concern.  The family member/significant other verbalizes understanding of the suicide prevention education information provided.  The family member/significant other agrees to remove the items of safety concern listed above.  Juan Wiggins Hairston Asif Muchow 07/12/2013, 8:19 AM

## 2013-07-12 NOTE — Progress Notes (Signed)
Adult Psychoeducational Group Note  Date:  07/12/2013 Time:  9:25 PM  Group Topic/Focus:  Wrap-Up Group:   The focus of this group is to help patients review their daily goal of treatment and discuss progress on daily workbooks.  Participation Level:  Active  Participation Quality:  Appropriate  Affect:  Appropriate  Cognitive:  Appropriate  Insight: Appropriate  Engagement in Group:  Engaged  Modes of Intervention:  Support  Additional Comments:  Pt stated that positive thing that happened was that he woke up with a clear mind today and that he now has hope to deal with his problems. Pt stated that he plans to take one thing at a time. Pt was given support and words of encouragement to do exactly what he suggested  [name] 07/12/2013, 9:25 PM

## 2013-07-12 NOTE — Tx Team (Signed)
Interdisciplinary Treatment Plan Update   Date Reviewed:  07/12/2013  Time Reviewed:  10:04 AM  Progress in Treatment:   Attending groups: Yes Participating in groups: Yes Taking medication as prescribed: Yes  Tolerating medication: Yes Family/Significant other contact made: Yes, collateral contact with sister. Patient understands diagnosis: Yes  Discussing patient identified problems/goals with staff: Yes Medical problems stabilized or resolved: Yes Denies suicidal/homicidal ideation: Yes Patient has not harmed self or others: Yes  For review of initial/current patient goals, please see plan of care.  Estimated Length of Stay:  2-4 days  Reasons for Continued Hospitalization:  Anxiety Depression Medication stabilization   New Problems/Goals identified:    Discharge Plan or Barriers:   Home with outpatient follow up to be determined  Additional Comments:  Attendees:  Patient:  07/12/2013 10:04 AM   Signature: Mylinda Latina, MD 07/12/2013 10:04 AM  Signature:  Nena Polio, PA 07/12/2013 10:04 AM  Signature:  Eduard Roux, RN 07/12/2013 10:04 AM  Signature: 07/12/2013 10:04 AM  Signature:  07/12/2013 10:04 AM  Signature:  Joette Catching, LCSW 07/12/2013 10:04 AM  Signature:  Regan Lemming, LCSW 07/12/2013 10:04 AM  Signature:  Lucinda Dell, Care Coordinator [facility] 07/12/2013 10:04 AM  Signature:  Marshall Cork, RN 07/12/2013 10:04 AM  Signature:  07/12/2013  10:04 AM  Signature:   Lars Pinks, RN [facility] 07/12/2013  10:04 AM  Signature: 07/12/2013  10:04 AM    Scribe for Treatment Team:   Joette Catching,  07/12/2013 10:04 AM

## 2013-07-12 NOTE — BHH Group Notes (Signed)
[facility] LCSW Aftercare Discharge Planning Group Note   07/12/2013 12:00 PM    Participation Quality:  [place]  Mood/Affect:  Appropriate  Depression Rating:  4  Anxiety Rating:  7  Thoughts of Suicide:  No  Will you contract for safety?   NA  Current AVH:  No  Plan for Discharge/Comments:  Patient attended discharge planning group and actively participated in group.  Patient is considering follow up with [facility].  CSW provided all participants with daily workbook.   Transportation Means: Patient has transportation.   Supports:  Patient has a support system.   Star Resler, Eulas Post

## 2013-07-12 NOTE — Progress Notes (Signed)
Patient ID: Juan Wiggins, male   DOB: 1950-08-25, 63 y.o.   MRN: [id number] Patient ID: Juan Wiggins, male   DOB: 03/06/1950, 63 y.o.   MRN: [id number] Patient ID: Juan Wiggins, male   DOB: 1951-01-21, 63 y.o.   MRN: [id number] [facility] [employer] MD Progress Note  07/12/2013 1:32 PM Juan Wiggins  MRN:  [id number]  Subjective:  Patient was admitted with depression, suicidal ideation and alcohol dependence. He has several psychosocial problems mostly related to his separation, divorce process, financial, lack on communication with his wife and three grown up children. He stated that he would plan to end his life with taking pills instead of going to Jail/Prison as he learned from court hearing. He has dad who was 89 years old and sister who was supportive to him. He has severe arthritis pain and needs medication and may wants to go to for surgery in near future.  Patient was seen today and reportedly compliant with medication and tolerated well  medication Symbyax. Patient reported he slept really well last night and his appetite is getting better. Patient appreciated the medication change made for him yesterday.  He is still having racing thoughts and seem to shut his brain to sleep. Patient continue to contemplate suicide ideation if he needs to go and so Jail time for driving under influence charges. He denies HI or AVH. He reports no symptoms of alcohol withdrawal instead of intoxicated on arrival. He says his depression is a  6/10 and he has anxiety 6/10.  Diagnosis:   DSM5: Schizophrenia Disorders:  NA Obsessive-Compulsive Disorders:  NA Trauma-Stressor Disorders:  NA Substance/Addictive Disorders:  Alcohol Related Disorder - Severe (303.90) Depressive Disorders:  Substance induced mood disorder Total Time spent with patient: 35 minutes  Axis I: Substance Induced Mood Disorder and Alcohol dependence, Major depressive disorder, recurent Axis II: Deferred Axis III:  Past Medical History  Diagnosis Date  .  Hepatitis A   . Hypertension   . Thyroid disease   . Hemorrhoids   . Arthritis   . Hypothyroidism    Axis IV: other psychosocial or environmental problems and Alcoholism, marital separation Axis V: 41-50 serious symptoms  ADL's:  Intact  Sleep: Poor  Appetite:  Good  Suicidal Ideation:  Plan:  Denies Intent:  Denies Means:  Denies Homicidal Ideation:  Plan:  Denies Intent:  Denies Means:  Denies AEB (as evidenced by):  Psychiatric Specialty Exam: Physical Exam  Psychiatric: His speech is normal and behavior is normal. Judgment and thought content normal. His mood appears anxious. Cognition and memory are normal. He exhibits a depressed mood.    Review of Systems  Constitutional: Positive for malaise/fatigue and diaphoresis.  HENT: Negative.   Eyes: Negative.   Respiratory: Negative.   Cardiovascular: Negative.   Gastrointestinal: Negative.   Genitourinary: Negative.   Musculoskeletal: Positive for back pain, joint pain and myalgias.  Skin: Negative.   Neurological: Positive for tremors and weakness.  Endo/Heme/Allergies: Negative.   Psychiatric/Behavioral: Positive for depression, suicidal ideas (fleeting thoughts) and substance abuse (Alcoholism). Negative for hallucinations and memory loss. The patient is nervous/anxious and has insomnia.     Blood pressure 132/61, pulse 101, temperature 98 F (36.7 C), temperature source Oral, resp. rate 20, height 6' (1.829 m), weight 96.163 kg (212 lb), SpO2 97.00%.Body mass index is 28.75 kg/(m^2).  General Appearance: Casual  Eye Contact::  Fair  Speech:  Clear and Coherent  Volume:  Normal  Mood:  Anxious, Depressed and tearful  Affect:  Depressed, Flat and Tearful  Thought Process:  Coherent and Intact  Orientation:  Full (Time, Place, and Person)  Thought Content:  Rumination, racing thoughts  Suicidal Thoughts:  Yes.  without intent/plan  Homicidal Thoughts:  No  Memory:  Immediate;   Good Recent;   Good Remote;    Good  Judgement:  Impaired  Insight:  Shallow  Psychomotor Activity:  Restlessness and Tremor  Concentration:  Poor  Recall:  [place]  Language: Good  Akathisia:  No  Handed:  Right  AIMS (if indicated):     Assets:  Desire for Improvement  Sleep:  Number of Hours: 5.25   Musculoskeletal: Strength & Muscle Tone: within normal limits Gait & Station: normal Patient leans: N/A  Current Medications: Current Facility-Administered Medications  Medication Dose Route Frequency Provider Last Rate Last Dose  . alum & mag hydroxide-simeth (MAALOX/MYLANTA) 200-200-20 MG/5ML suspension 30 mL  30 mL Oral PRN Hampton Abbot, MD      . gabapentin (NEURONTIN) capsule 300 mg  300 mg Oral TID Encarnacion Slates, NP   300 mg at 07/10/13 1200  . ibuprofen (ADVIL,MOTRIN) tablet 600 mg  600 mg Oral Q8H PRN Encarnacion Slates, NP   600 mg at 07/08/13 2157  . levothyroxine (SYNTHROID, LEVOTHROID) tablet 200 mcg  200 mcg Oral QAC breakfast Hampton Abbot, MD   200 mcg at 07/10/13 0902  . lisinopril (PRINIVIL,ZESTRIL) tablet 10 mg  10 mg Oral Daily Hampton Abbot, MD   10 mg at 07/10/13 0903  . LORazepam (ATIVAN) tablet 0-4 mg  0-4 mg Oral Q12H Hampton Abbot, MD   1 mg at 07/10/13 0800  . LORazepam (ATIVAN) tablet 1 mg  1 mg Oral Q8H PRN Hampton Abbot, MD   1 mg at 07/08/13 2201  . mirtazapine (REMERON) tablet 15 mg  15 mg Oral QHS Encarnacion Slates, NP   15 mg at 07/09/13 2114  . ondansetron (ZOFRAN) tablet 4 mg  4 mg Oral Q8H PRN Hampton Abbot, MD   4 mg at 07/08/13 9371  . QUEtiapine (SEROQUEL) tablet 250 mg  250 mg Oral QHS Encarnacion Slates, NP   250 mg at 07/09/13 2114  . thiamine (VITAMIN B-1) tablet 100 mg  100 mg Oral Daily Hampton Abbot, MD   100 mg at 07/10/13 6967   Or  . thiamine (B-1) injection 100 mg  100 mg Intravenous Daily Hampton Abbot, MD      . zolpidem (AMBIEN) tablet 5 mg  5 mg Oral QHS PRN Lurena Nida, NP   5 mg at 07/07/13 2204    Lab Results: No results found for this or any  previous visit (from the past 48 hour(s)).  Physical Findings: AIMS: Facial and Oral Movements Muscles of Facial Expression: None, normal Lips and Perioral Area: None, normal Jaw: None, normal Tongue: None, normal,Extremity Movements Upper (arms, wrists, hands, fingers): None, normal Lower (legs, knees, ankles, toes): None, normal, Trunk Movements Neck, shoulders, hips: None, normal, Overall Severity Severity of abnormal movements (highest score from questions above): None, normal Incapacitation due to abnormal movements: None, normal Patient's awareness of abnormal movements (rate only patient's report): No Awareness, Dental Status Current problems with teeth and/or dentures?: No Does patient usually wear dentures?: No  CIWA:  CIWA-Ar Total: 1 COWS:  COWS Total Score: 2  Treatment Plan Summary: Daily contact with patient to assess and evaluate symptoms and progress in treatment Medication management  Plan: 1. Continue crisis management and stabilization.  2. Medication management: Continue Ativan taper for alcohol detox,  Continue Neurontin 400 mg TID for agitation/pain.  Continue Celebrex 400mg  po qd for RA pain. Continue.Symbyax 12/50 mg PO Qhs for depression with racing thoughts. 3. Encouraged patient to attend groups and participate in group counseling sessions and activities.   4. Continue current treatment plan.  5. Address health issues: Vitals reviewed and stable.  6. Disposition in processand patient may be discharged home on Friday if he continued to show clinical improvement in contract for safety.  Medical Decision Making Problem Points:  Review of last therapy session (1) and Review of psycho-social stressors (1) Data Points:  Review of medication regiment & side effects (2) Review of new medications or change in dosage (2)  I certify that inpatient services furnished can reasonably be expected to improve the patient's condition.   Parke Simmers  Azul Coffie 07/12/2013 1:32 PM

## 2013-07-12 NOTE — BHH Group Notes (Signed)
[place] LCSW Group Therapy  Emotional Regulation 1:15 - 2: 30 PM        07/12/2013     Type of Therapy:  Group Therapy  Participation Level:  Appropriate  Participation Quality:  Appropriate  Affect:  Appropriate  Cognitive:  Attentive Appropriate  Insight:  Developing/Improving Engaged  Engagement in Therapy:  Developing/Improving Engaged  Modes of Intervention:  Discussion Exploration Problem-Solving Supportive  Summary of Progress/Problems:  Group topic was emotional regulations.  Patient participated in the discussion and was able to identify an emotion that needed to regulated.  Patient shared the feeling he has to deal with is loneliness from the break up of his marriage.  He stated he knows he can move on but has made a lot of wrong decisions prior to recognizing life goes on. Patient was able to identify approprite coping skills.  Juan Wiggins 07/12/2013

## 2013-07-12 NOTE — Progress Notes (Signed)
D: Patient's affect appropriate to circumstance and mood is anxious. Patient rates depression "3/4" today and feelings of hopelessness "7". He's attending scheduled group sessions throughout the day, visible in the milieu and interactive with peers in the dayroom. Patient is compliant with medications.  A: Support and encouragement provided to patient. Scheduled medications administered per MD orders. Maintain Q15 minute checks for safety.  R: Patient receptive. Denies SI/HI/AVH. Patient remains safe.

## 2013-07-13 DIAGNOSIS — F339 Major depressive disorder, recurrent, unspecified: Secondary | ICD-10-CM

## 2013-07-13 MED ORDER — OLANZAPINE-FLUOXETINE HCL 6-25 MG PO CAPS
2.0000 | ORAL_CAPSULE | Freq: Every day | ORAL | Status: DC
  Filled 2013-07-13 (×3): qty 2

## 2013-07-13 NOTE — Progress Notes (Signed)
D: Patient continues to have appropriate affect and mood. He reported on the self inventory sheet that he's sleeping fair, appetite is improving, energy level is low and ability to pay attention is good. Patient rates depression "4" and feelings of hopelessness "6". He's actively participating in groups. Patient adheres to current medication regimen.  A: Support and encouragement provided to patient. Administered scheduled medications per ordering MD. Monitor Q15 minute checks for safety.  R: Patient receptive. He reported being passive SI on the inventory sheet, but verbally addressed that it was primarily the thoughts he had last night and not at this time. Currently, the patient denies SI/HI/AVH. Patient remains safe on the unit.

## 2013-07-13 NOTE — BHH Group Notes (Signed)
[facility] LCSW Group Therapy  Living A Balanced Life  1:15 - 2: 30          07/13/2013    Type of Therapy:  Group Therapy  Participation Level:  Appropriate  Participation Quality:  Appropriate  Affect:  Appropriate  Cognitive:  Attentive Appropriate  Insight: Developing/Improving  Engagement in Therapy:  Developing/Improving  Modes of Intervention:  Discussion Exploration Problem-Solving Supportive   Summary of Progress/Problems: Topic for group was Living a Balanced Life.  Patient was able to show how life has become unbalanced.  He shared his life is out of balance due to medical problems and not having family in his life at this time.   Patient able to  Identify appropriate coping skills.   Concha Pyo 07/13/2013

## 2013-07-13 NOTE — Progress Notes (Signed)
D: Patient in the hallway on approach.  Patient animated and bright tonight.  Patient states he feels like a new person now that he was finally able to get sleep last night.  Patient state she does not know what happen but he has new outlook on his life situations.  Patient states all of his court cases will be there and he states he will just have to face the consequences.  Patient denies SI/HI and denies AVH. A: Staff to monitor Q 15 mins for safety.  Encouragement and support offered.  Scheduled medications administered per orders. Ambien administered prn for sleep. R: Patient remains safe on the unit.  Patient attended group tonight.  Patient taking administered medications.  Patient visible on the unit and interacting with peers.

## 2013-07-13 NOTE — Progress Notes (Signed)
Adult Psychoeducational Group Note  Date:  07/13/2013 Time:  9:00 AM  Group Topic/Focus:  Morning Wellness Group  Participation Level:  Active  Participation Quality:  Attentive  Affect:  Appropriate  Cognitive:  Alert and Oriented  Insight: Appropriate  Engagement in Group:  Engaged  Modes of Intervention:  Discussion  Additional Comments: Patient voiced that his goal is to not focus so much on the negative things that are going on in his life at this time.  Kathlen Brunswick 07/13/2013, 10:15 AM
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
## 2013-07-13 NOTE — Progress Notes (Signed)
Patient ID: Juan Wiggins, male   DOB: August 14, 1950, 63 y.o.   MRN: [id number]  [facility] MD Progress Note  07/13/2013 4:26 PM [name]  MRN:  [id number]  Subjective:   Patient states "My thoughts are not racing as bad. After my medications were changed I could feel my stress level decrease. It's hard to explain but something changed. I feel more focused. I'm still worried about the outcome of all my DUI's. I admit I got very reckless. I refuse to do any jail time. Not even one day. I'm too old for that. My only two options in my mind is either run or end it all. Depends on the verdict. I know how to disappear. I want to be successful again and I can let some things go like my divorce. I don't think I'm crazy. I was fine before my wife left. Then everything just started to unravel."  Objective:  The patient is visible and active on the unit. He was observed participating in group prior to his assessment. Patient reports ongoing symptoms of depression and anxiety. Rates his depression at four which has decreased from six yesterday. He appears motivated to learn to cope with his multiple stressors. However, he is clear that there are some consequences of his past actions that he is not willing to deal with. Patient is compliant with his medications and denies any adverse effects. He remains at high risk for self harm due to multiple stressors. Patient is unable to recall exactly how many DUI's that he has.   Diagnosis:   DSM5: Schizophrenia Disorders:  NA Obsessive-Compulsive Disorders:  NA Trauma-Stressor Disorders:  NA Substance/Addictive Disorders:  Alcohol Related Disorder - Severe (303.90) Depressive Disorders:  Substance induced mood disorder Total Time spent with patient: 30 minutes  Axis I: Substance Induced Mood Disorder and Alcohol dependence, Major depressive disorder, recurent Axis II: Deferred Axis III:  Past Medical History  Diagnosis Date  . Hepatitis A   . Hypertension   . Thyroid  disease   . Hemorrhoids   . Arthritis   . Hypothyroidism    Axis IV: other psychosocial or environmental problems and Alcoholism, marital separation Axis V: 41-50 serious symptoms  ADL's:  Intact  Sleep: Poor  Appetite:  Good  Suicidal Ideation:  Plan:  Denies Intent:  Denies Means:  Denies Homicidal Ideation:  Plan:  Denies Intent:  Denies Means:  Denies AEB (as evidenced by):  Psychiatric Specialty Exam: Physical Exam  Psychiatric: His speech is normal and behavior is normal. Judgment and thought content normal. His mood appears anxious. Cognition and memory are normal. He exhibits a depressed mood.    Review of Systems  Constitutional: Positive for malaise/fatigue.  HENT: Negative.   Eyes: Negative.   Respiratory: Negative.   Cardiovascular: Negative.   Gastrointestinal: Negative.   Genitourinary: Negative.   Musculoskeletal: Positive for back pain, joint pain and myalgias.  Skin: Negative.   Neurological: Positive for tremors.  Endo/Heme/Allergies: Negative.   Psychiatric/Behavioral: Positive for depression, suicidal ideas (fleeting thoughts) and substance abuse (Alcoholism). Negative for hallucinations and memory loss. The patient is nervous/anxious and has insomnia.     Blood pressure 128/84, pulse 101, temperature 98.4 F (36.9 C), temperature source Oral, resp. rate 18, height 6' (1.829 m), weight 96.163 kg (212 lb), SpO2 97.00%.Body mass index is 28.75 kg/(m^2).  General Appearance: Casual  Eye Contact::  Good  Speech:  Clear and Coherent  Volume:  Normal  Mood:  Anxious and Depressed  Affect:  Congruent  Thought Process:  Coherent and Intact  Orientation:  Full (Time, Place, and Person)  Thought Content:  Rumination, racing thoughts  Suicidal Thoughts:  Yes.  with intent/plan  Homicidal Thoughts:  No  Memory:  Immediate;   Good Recent;   Good Remote;   Good  Judgement:  Impaired  Insight:  Shallow  Psychomotor Activity:  Restlessness and Tremor   Concentration:  Poor  Recall:  [place]  Language: Good  Akathisia:  No  Handed:  Right  AIMS (if indicated):     Assets:  Desire for Improvement Leisure Time Resilience Talents/Skills  Sleep:  Number of Hours: 6.5   Musculoskeletal: Strength & Muscle Tone: within normal limits Gait & Station: normal Patient leans: N/A  Current Medications: Current Facility-Administered Medications  Medication Dose Route Frequency Provider Last Rate Last Dose  . alum & mag hydroxide-simeth (MAALOX/MYLANTA) 200-200-20 MG/5ML suspension 30 mL  30 mL Oral PRN Hampton Abbot, MD      . gabapentin (NEURONTIN) capsule 300 mg  300 mg Oral TID Encarnacion Slates, NP   300 mg at 07/10/13 1200  . ibuprofen (ADVIL,MOTRIN) tablet 600 mg  600 mg Oral Q8H PRN Encarnacion Slates, NP   600 mg at 07/08/13 2157  . levothyroxine (SYNTHROID, LEVOTHROID) tablet 200 mcg  200 mcg Oral QAC breakfast Hampton Abbot, MD   200 mcg at 07/10/13 0902  . lisinopril (PRINIVIL,ZESTRIL) tablet 10 mg  10 mg Oral Daily Hampton Abbot, MD   10 mg at 07/10/13 0903  . LORazepam (ATIVAN) tablet 0-4 mg  0-4 mg Oral Q12H Hampton Abbot, MD   1 mg at 07/10/13 0800  . LORazepam (ATIVAN) tablet 1 mg  1 mg Oral Q8H PRN Hampton Abbot, MD   1 mg at 07/08/13 2201  . mirtazapine (REMERON) tablet 15 mg  15 mg Oral QHS Encarnacion Slates, NP   15 mg at 07/09/13 2114  . ondansetron (ZOFRAN) tablet 4 mg  4 mg Oral Q8H PRN Hampton Abbot, MD   4 mg at 07/08/13 5956  . QUEtiapine (SEROQUEL) tablet 250 mg  250 mg Oral QHS Encarnacion Slates, NP   250 mg at 07/09/13 2114  . thiamine (VITAMIN B-1) tablet 100 mg  100 mg Oral Daily Hampton Abbot, MD   100 mg at 07/10/13 3875   Or  . thiamine (B-1) injection 100 mg  100 mg Intravenous Daily Hampton Abbot, MD      . zolpidem (AMBIEN) tablet 5 mg  5 mg Oral QHS PRN Lurena Nida, NP   5 mg at 07/07/13 2204    Lab Results: No results found for this or any previous visit (from the past 48 hour(s)).  Physical  Findings: AIMS: Facial and Oral Movements Muscles of Facial Expression: None, normal Lips and Perioral Area: None, normal Jaw: None, normal Tongue: None, normal,Extremity Movements Upper (arms, wrists, hands, fingers): None, normal Lower (legs, knees, ankles, toes): None, normal, Trunk Movements Neck, shoulders, hips: None, normal, Overall Severity Severity of abnormal movements (highest score from questions above): None, normal Incapacitation due to abnormal movements: None, normal Patient's awareness of abnormal movements (rate only patient's report): No Awareness, Dental Status Current problems with teeth and/or dentures?: No Does patient usually wear dentures?: No  CIWA:  CIWA-Ar Total: 1 COWS:  COWS Total Score: 2  Treatment Plan Summary: Daily contact with patient to assess and evaluate symptoms and progress in treatment Medication management  Plan: 1. Continue crisis management and stabilization.  2. Medication management:  Continue Ativan 1 mg every eight hours prn anxiety/alcohol withdrawal. Continue Neurontin 400 mg TID for agitation/pain.  Continue Celebrex 400mg  po qd for RA pain. Continue.Symbyax 12/50 mg PO Qhs for depression with racing thoughts. -Continue Ambien 5 mg hs prn insomnia.  3. Encouraged patient to attend groups and participate in group counseling sessions and activities.   4. Continue current treatment plan.  5. Address health issues: Vitals reviewed and stable.  6. Disposition in processand patient may be discharged home on Friday if he continued to show clinical improvement and can contract for his safety.   Medical Decision Making Problem Points:  Review of last therapy session (1) and Review of psycho-social stressors (1) Data Points:  Review of medication regiment & side effects (2) Review of new medications or change in dosage (2)  I certify that inpatient services furnished can reasonably be expected to improve the patient's condition.    Elmarie Shiley NP-C 07/13/2013 4:26 PM  Reviewed the information documented and agree with the treatment plan.  Parke Simmers Netty Sullivant 07/14/2013 9:15 AM

## 2013-07-14 DIAGNOSIS — F1020 Alcohol dependence, uncomplicated: Secondary | ICD-10-CM | POA: Diagnosis present

## 2013-07-14 MED ORDER — ALUM & MAG HYDROXIDE-SIMETH 200-200-20 MG/5ML PO SUSP: 30.0000 mL | ORAL | Status: DC | PRN

## 2013-07-14 MED ORDER — GABAPENTIN 400 MG PO CAPS: 400.0000 mg | ORAL_CAPSULE | Freq: Three times a day (TID) | ORAL | Status: DC

## 2013-07-14 MED ORDER — LISINOPRIL 10 MG PO TABS: 10.0000 mg | ORAL_TABLET | Freq: Every day | ORAL | Status: DC

## 2013-07-14 MED ORDER — ACETAMINOPHEN 325 MG PO TABS: 650.0000 mg | ORAL_TABLET | Freq: Four times a day (QID) | ORAL | Status: DC | PRN

## 2013-07-14 MED ORDER — ZOLPIDEM TARTRATE 5 MG PO TABS: 5.0000 mg | ORAL_TABLET | Freq: Every evening | ORAL | Status: DC | PRN

## 2013-07-14 MED ORDER — LEVOTHYROXINE SODIUM 200 MCG PO TABS: 200.0000 ug | ORAL_TABLET | Freq: Every day | ORAL | Status: DC

## 2013-07-14 MED ORDER — MAGNESIUM HYDROXIDE 400 MG/5ML PO SUSP: 30.0000 mL | Freq: Every day | ORAL | Status: DC | PRN

## 2013-07-14 MED ORDER — CELECOXIB 400 MG PO CAPS: 400.0000 mg | ORAL_CAPSULE | Freq: Every day | ORAL | Status: DC

## 2013-07-14 MED ORDER — OLANZAPINE-FLUOXETINE HCL 6-25 MG PO CAPS: 2.0000 | ORAL_CAPSULE | Freq: Every day | ORAL | Status: DC

## 2013-07-14 NOTE — Progress Notes (Signed)
Discharge Note: Discharge instructions and prescriptions given to patient. Patient verbalized understanding of discharge instructions and prescriptions. Returned belongings to patient. Denies SI/HI/AVH. Patient d/c without incident to the lobby and transported home by sister.

## 2013-07-14 NOTE — Tx Team (Signed)
Interdisciplinary Treatment Plan Update   Date Reviewed:  07/14/2013  Time Reviewed:  9:57 AM  Progress in Treatment:   Attending groups: Yes Participating in groups: Yes Taking medication as prescribed: Yes  Tolerating medication: Yes Family/Significant other contact made: Yes, collateral contact with sister. Patient understands diagnosis: Yes  Discussing patient identified problems/goals with staff: Yes Medical problems stabilized or resolved: Yes Denies suicidal/homicidal ideation: Yes Patient has not harmed self or others: Yes  For review of initial/current patient goals, please see plan of care.  Estimated Length of Stay:  Discharge today  Reasons for Continued Hospitalization:    New Problems/Goals identified:    Discharge Plan or Barriers:   Home with outpatient follow up with [facility]  Additional Comments:  Attendees:  Patient:  Juan Wiggins 07/14/2013 9:57 AM   Signature: Mylinda Latina, MD 07/14/2013 9:57 AM  Signature:  Nena Polio, PA 07/14/2013 9:57 AM  Signature:  Eduard Roux, RN 07/14/2013 9:57 AM  Signature:  Milly Jakob, RN 07/14/2013 9:57 AM  Signature:  Drake Leach, RN 07/14/2013 9:57 AM  Signature:  Joette Catching, LCSW 07/14/2013 9:57 AM  Signature:  Regan Lemming, LCSW 07/14/2013 9:57 AM  Signature:  Lucinda Dell, Care Coordinator [facility] 07/14/2013 9:57 AM  Signature:   07/14/2013 9:57 AM  Signature:  07/14/2013  9:57 AM  Signature:   Lars Pinks, RN [facility] 07/14/2013  9:57 AM  Signature: 07/14/2013  9:57 AM    Scribe for Treatment Team:   Joette Catching,  07/14/2013 9:57 AM
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
## 2013-07-14 NOTE — [facility] Suicide Risk Assessment (Signed)
   Demographic Factors:  Male, Adolescent or young adult, Caucasian and Unemployed  Total Time spent with patient: 30 minutes  Psychiatric Specialty Exam: Physical Exam  ROS  Blood pressure 120/77, pulse 116, temperature 98.4 F (36.9 C), temperature source Oral, resp. rate 17, height 6' (1.829 m), weight 96.163 kg (212 lb), SpO2 97.00%.Body mass index is 28.75 kg/(m^2).  General Appearance: Casual  Eye Contact::  Good  Speech:  Clear and Coherent  Volume:  Normal  Mood:  Anxious and Depressed  Affect:  Appropriate and Congruent  Thought Process:  Coherent and Goal Directed  Orientation:  Full (Time, Place, and Person)  Thought Content:  WDL  Suicidal Thoughts:  No  Homicidal Thoughts:  No  Memory:  Immediate;   Good Recent;   Good  Judgement:  Fair  Insight:  Good  Psychomotor Activity:  Psychomotor Retardation  Concentration:  Good  Recall:  Good  Fund of Knowledge:Good  Language: Good  Akathisia:  NA  Handed:  Right  AIMS (if indicated):     Assets:  Communication Skills Desire for Improvement Financial Resources/Insurance Housing Intimacy Leisure Time [place] Talents/Skills Transportation  Sleep:  Number of Hours: 6.5    Musculoskeletal: Strength & Muscle Tone: within normal limits Gait & Station: normal Patient leans: N/A   Mental Status Per Nursing Assessment::   On Admission:     Current Mental Status by Physician: Intentions to act on plan to harm others  Loss Factors: Financial problems/change in socioeconomic status  Historical Factors: Impulsivity  Risk Reduction Factors:   Sense of responsibility to family, Religious beliefs about death, Living with another person, especially a relative, Positive social support, Positive therapeutic relationship and Positive coping skills or problem solving skills  Continued Clinical Symptoms:  Depression:   Recent sense of peace/wellbeing Alcohol/Substance  Abuse/Dependencies Previous Psychiatric Diagnoses and Treatments Medical Diagnoses and Treatments/Surgeries  Cognitive Features That Contribute To Risk:  Polarized thinking    Suicide Risk:  Minimal: No identifiable suicidal ideation.  Patients presenting with no risk factors but with morbid ruminations; may be classified as minimal risk based on the severity of the depressive symptoms  Discharge Diagnoses:   AXIS I:  Major Depression, single episode AXIS II:  Deferred AXIS III:   Past Medical History  Diagnosis Date  . Hepatitis A   . Hypertension   . Thyroid disease   . Hemorrhoids   . Arthritis   . Hypothyroidism    AXIS IV:  economic problems, other psychosocial or environmental problems, problems related to social environment and problems with primary support group AXIS V:  61-70 mild symptoms  Plan Of Care/Follow-up recommendations:  Activity:  As tolerated Diet:  Regular  Is patient on multiple antipsychotic therapies at discharge:  No   Has Patient had three or more failed trials of antipsychotic monotherapy by history:  No  Recommended Plan for Multiple Antipsychotic Therapies: NA    Juan Wiggins 07/14/2013, 12:40 PM

## 2013-07-14 NOTE — Progress Notes (Signed)
[facility] Adult Case Management Discharge Plan :  Will you be returning to the same living situation after discharge: Yes,  Patient to return to his home. At discharge, do you have transportation home?:Yes,  Patient to arrange transportation home. Do you have the ability to pay for your medications:Yes,  Patient is able to afford medications.  Release of information consent forms completed and in the chart;  Patient's signature needed at discharge.  Patient to Follow up at: Follow-up Information   Follow up with [facility] On 07/24/2013. (Monday, July 24, 2013 arriving at 10:00 AM for an appointment with [facility])    Contact information:   [address] [place], [place]   [address]  [phone]      Patient denies SI/HI:   Patient no longer endorsing SI/HI or other thoughts of self harm.   Safety Planning and Suicide Prevention discussed: .Reviewed with all patients during discharge planning group   [place] 07/14/2013, 11:02 AM

## 2013-07-14 NOTE — Progress Notes (Signed)
Pt complained he did not get any sleep last night. Pt records show pt was getting the most sleep up until 2 nights ago.

## 2013-07-14 NOTE — [facility] Group Notes (Signed)
[facility] LCSW Aftercare Discharge Planning Group Note   07/14/2013 10:56 AM    Participation Quality:  [place]  Mood/Affect:  Appropriate  Depression Rating:  4  Anxiety Rating:  4  Thoughts of Suicide:  No  Will you contract for safety?   NA  Current AVH:  No  Plan for Discharge/Comments:  Patient attended discharge planning group and actively participated in group.  Patient will follow up with [facility].  CSW provided all participants with daily workbook.   Transportation Means: Patient has transportation.   Supports:  Patient has a support system.   Carolos Fecher, [place]ulas Post

## 2013-07-19 NOTE — Progress Notes (Signed)
Patient Discharge Instructions:  After Visit Summary (AVS):   Faxed to:  07/19/13 Psychiatric Admission Assessment Note:   Faxed to:  07/19/13 Suicide Risk Assessment - Discharge Assessment:   Faxed to:  07/19/13 Faxed/Sent to the Next Level Care provider:  07/19/13 Faxed to [place] [facility] [place], 07/19/2013, 3:13 PM

## 2013-08-02 NOTE — Discharge Summary (Signed)
Physician Discharge Summary Note  Patient:  Juan Wiggins is an 63 y.o., male MRN:  [id number] DOB:  1950/02/28 Patient phone:  [phone] (home)  Patient address:   [place] [address],  Total Time spent with patient: 30 minutes  Date of Admission:  07/07/2013 Date of Discharge: 07/14/2013  Reason for Admission:  Alcohol detox, MDD  Discharge Diagnoses: Active Problems:   Alcohol dependence with alcohol-induced disorder   Alcohol dependence   Psychiatric Specialty Exam: Physical Exam  ROS  Blood pressure 120/77, pulse 116, temperature 98.4 F (36.9 C), temperature source Oral, resp. rate 17, height 6' (1.829 m), weight 96.163 kg (212 lb), SpO2 97.00%.Body mass index is 28.75 kg/(m^2).   General Appearance: Casual  Eye Contact::  Good  Speech:  Clear and Coherent  Volume:  Normal  Mood:  Anxious and Depressed  Affect:  Appropriate and Congruent  Thought Process:  Coherent and Goal Directed  Orientation:  Full (Time, Place, and Person)  Thought Content:  WDL  Suicidal Thoughts:  No  Homicidal Thoughts:  No  Memory:  Immediate;   Good Recent;   Good  Judgement:  Fair  Insight:  Good  Psychomotor Activity:  Psychomotor Retardation  Concentration:  Good  Recall:  Good  Fund of Knowledge:Good  Language: Good  Akathisia:  NA  Handed:  Right  AIMS (if indicated):     Assets:  Communication Skills Desire for Improvement Financial Resources/Insurance Housing Intimacy Leisure Time [place] Talents/Skills Transportation  Sleep:  Number of Hours: 6.5    Musculoskeletal: Strength & Muscle Tone: within normal limits Gait & Station: normal Patient leans: N/A  DSM5: Schizophrenia Disorders:  NA Obsessive-Compulsive Disorders:  NA Trauma-Stressor Disorders:  NA Substance/Addictive Disorders:  Alcohol Related Disorder - Severe (303.90) Depressive Disorders:  Substance induced mood disorder  AXIS I:  Substance  Induced Mood Disorder and Alcohol dependence AXIS II:  Deferred AXIS III:   Past Medical History  Diagnosis Date  . Hepatitis A   . Hypertension   . Thyroid disease   . Hemorrhoids   . Arthritis   . Hypothyroidism    AXIS IV:  other psychosocial or environmental problems and problems related to social environment AXIS V:  61-70 mild symptoms  Level of Care:  OP  Hospital Course:  This is a 63 year old Caucasian male, he reports, "My sister took me to the ED yesterday because I wanted to die. I'm just a failure. Married x 31 years, going through separation and divorce since the last 3 years. Could not deal with it, started self destructive behavior. Drank a lot of alcohol up to a half gallon of scotch daily. Got several DUIs. Several court dates for the DUIs hanging over my head right now. I don't want to go to jail. I rather die than to go to jail.  I have lost everything.  I love my wife, she has not talked to me in 3 years. Been severely depressed x 3 years. Tried Lexapro, Xanax, Depakote, none helped me. Been through counseling sessions at the [facility] and [facility], I'm still depressed. My mind runs and races. I have anxiety attacks, chronic pain from arthritis. I cry all the time. I have lost my appetite for food. I just want to lay down and die".  During Hospitalization: Medications managed, psychoeducation, group and individual therapy. Pt currently denies SI, HI, and Psychosis. At discharge, pt rates anxiety and depression as moderate. Pt  states that he does have a good supportive home environment and will followup with outpatient treatment at [facility]. Affirms agreement with medication regimen and discharge plan. Denies other physical and psychological concerns at time of discharge.   Consults:  None  Significant Diagnostic Studies:  None  Discharge Vitals:   Blood pressure 120/77, pulse 116, temperature 98.4 F (36.9 C), temperature  source Oral, resp. rate 17, height 6' (1.829 m), weight 96.163 kg (212 lb), SpO2 97.00%. Body mass index is 28.75 kg/(m^2). Lab Results:   No results found for this or any previous visit (from the past 72 hour(s)).  Physical Findings: AIMS: Facial and Oral Movements Muscles of Facial Expression: None, normal Lips and Perioral Area: None, normal Jaw: None, normal Tongue: None, normal,Extremity Movements Upper (arms, wrists, hands, fingers): None, normal Lower (legs, knees, ankles, toes): None, normal, Trunk Movements Neck, shoulders, hips: None, normal, Overall Severity Severity of abnormal movements (highest score from questions above): None, normal Incapacitation due to abnormal movements: None, normal Patient's awareness of abnormal movements (rate only patient's report): No Awareness, Dental Status Current problems with teeth and/or dentures?: No Does patient usually wear dentures?: No  CIWA:  CIWA-Ar Total: 1 COWS:  COWS Total Score: 2  Psychiatric Specialty Exam: See Psychiatric Specialty Exam and Suicide Risk Assessment completed by Attending Physician prior to discharge.  Discharge destination:  Home  Is patient on multiple antipsychotic therapies at discharge:  No   Has Patient had three or more failed trials of antipsychotic monotherapy by history:  No  Recommended Plan for Multiple Antipsychotic Therapies: NA     Medication List    STOP taking these medications       TRAZODONE HCL PO      TAKE these medications     Indication   celecoxib 400 MG capsule  Commonly known as:  CELEBREX  Take 1 capsule (400 mg total) by mouth daily.   Indication:  Rheumatoid Arthritis     gabapentin 400 MG capsule  Commonly known as:  NEURONTIN  Take 1 capsule (400 mg total) by mouth 3 (three) times daily.   Indication:  mood stabilization     levothyroxine 200 MCG tablet  Commonly known as:  SYNTHROID, LEVOTHROID  Take 1 tablet (200 mcg total) by mouth daily before  breakfast.   Indication:  Underactive Thyroid     lisinopril 10 MG tablet  Commonly known as:  PRINIVIL,ZESTRIL  Take 1 tablet (10 mg total) by mouth daily.   Indication:  High Blood Pressure     OLANZapine-FLUoxetine 6-25 MG per capsule  Commonly known as:  SYMBYAX  Take 2 capsules by mouth at bedtime.   Indication:  mood stabilization     zolpidem 5 MG tablet  Commonly known as:  AMBIEN  Take 1 tablet (5 mg total) by mouth at bedtime as needed for sleep.   Indication:  Trouble Sleeping           Follow-up Information   Follow up with [facility] On 07/24/2013. (Monday, July 24, 2013 arriving at 10:00 AM for an appointment with [facility])    Contact information:   [address] [place], [place]   [address]  [phone]      Follow-up recommendations:  Activity:  As tolerated. Diet:  Heart healthy with low sodium.  Comments:   Take all medications as prescribed. Keep all follow-up appointments as scheduled.  Do not consume alcohol or use illegal drugs while on prescription medications. Report any  adverse effects from your medications to your primary care provider promptly.  In the event of recurrent symptoms or worsening symptoms, call 911, a crisis hotline, or go to the nearest emergency department for evaluation.   Total Discharge Time:  Greater than 30 minutes.  Signed: Benjamine Mola, FNP-BC 07/14/2013, 12:31 PM  Patient was seen face-to-face for psychiatric evaluation, suicide risk assessment and case discussed with his extender and developed appropriate disposition plans .Reviewed the information documented and agree with the treatment plan.  Sadie Hazelett,JANARDHAHA R. 08/02/2013 5:26 PM

## 2013-08-03 ENCOUNTER — Other Ambulatory Visit: Payer: Self-pay | Admitting: Surgical

## 2013-08-10 ENCOUNTER — Encounter (HOSPITAL_COMMUNITY): Payer: Self-pay | Admitting: Pharmacy Technician

## 2013-08-15 ENCOUNTER — Encounter (HOSPITAL_COMMUNITY)
Admission: RE | Admit: 2013-08-15 | Discharge: 2013-08-15 | Disposition: A | Discharge status: Home / Self Care | Payer: 59 | Source: Ambulatory Visit | Attending: Orthopedic Surgery | Admitting: Orthopedic Surgery

## 2013-08-15 ENCOUNTER — Encounter (INDEPENDENT_AMBULATORY_CARE_PROVIDER_SITE_OTHER): Payer: Self-pay

## 2013-08-15 ENCOUNTER — Ambulatory Visit (HOSPITAL_COMMUNITY)
Admission: RE | Admit: 2013-08-15 | Discharge: 2013-08-15 | Disposition: A | Discharge status: Home / Self Care | Payer: 59 | Source: Ambulatory Visit | Attending: Surgical | Admitting: Surgical

## 2013-08-15 ENCOUNTER — Encounter (HOSPITAL_COMMUNITY): Payer: Self-pay

## 2013-08-15 DIAGNOSIS — Z01812 Encounter for preprocedural laboratory examination: Secondary | ICD-10-CM | POA: Insufficient documentation

## 2013-08-15 DIAGNOSIS — Z01818 Encounter for other preprocedural examination: Secondary | ICD-10-CM | POA: Insufficient documentation

## 2013-08-15 DIAGNOSIS — I10 Essential (primary) hypertension: Secondary | ICD-10-CM | POA: Insufficient documentation

## 2013-08-15 DIAGNOSIS — F17200 Nicotine dependence, unspecified, uncomplicated: Secondary | ICD-10-CM | POA: Insufficient documentation

## 2013-08-15 HISTORY — DX: Sleep apnea, unspecified: G47.30

## 2013-08-15 HISTORY — DX: Depression, unspecified: F32.A

## 2013-08-15 HISTORY — DX: Low back pain: M54.5

## 2013-08-15 HISTORY — DX: Malignant (primary) neoplasm, unspecified: C80.1

## 2013-08-15 HISTORY — DX: Major depressive disorder, single episode, unspecified: F32.9

## 2013-08-15 HISTORY — DX: Anxiety disorder, unspecified: F41.9

## 2013-08-15 HISTORY — DX: Low back pain, unspecified: M54.50

## 2013-08-15 HISTORY — DX: Gastro-esophageal reflux disease without esophagitis: K21.9

## 2013-08-15 HISTORY — DX: Tachycardia, unspecified: R00.0

## 2013-08-15 LAB — COMPREHENSIVE METABOLIC PANEL
ALT: 19 U/L (ref 0–53)
AST: 25 U/L (ref 0–37)
Albumin: 3.8 g/dL (ref 3.5–5.2)
Alkaline Phosphatase: 93 U/L (ref 39–117)
BUN: 11 mg/dL (ref 6–23)
CO2: 25 mEq/L (ref 19–32)
Calcium: 9.4 mg/dL (ref 8.4–10.5)
Chloride: 98 mEq/L (ref 96–112)
Creatinine, Ser: 1.19 mg/dL (ref 0.50–1.35)
GFR calc Af Amer: 74 mL/min — ABNORMAL LOW (ref >90)
GFR calc non Af Amer: 64 mL/min — ABNORMAL LOW (ref >90)
Glucose, Bld: 92 mg/dL (ref 70–99)
Potassium: 4.4 mEq/L (ref 3.7–5.3)
Sodium: 137 mEq/L (ref 137–147)
Total Bilirubin: 0.2 mg/dL — ABNORMAL LOW (ref 0.3–1.2)
Total Protein: 7 g/dL (ref 6.0–8.3)

## 2013-08-15 LAB — URINALYSIS, ROUTINE W REFLEX MICROSCOPIC
Bilirubin Urine: NEGATIVE
Glucose, UA: NEGATIVE mg/dL
Hgb urine dipstick: NEGATIVE
Ketones, ur: NEGATIVE mg/dL
Leukocytes, UA: NEGATIVE
Nitrite: NEGATIVE
Protein, ur: NEGATIVE mg/dL
Specific Gravity, Urine: 1.018 (ref 1.005–1.030)
Urobilinogen, UA: 0.2 mg/dL (ref 0.0–1.0)
pH: 6 (ref 5.0–8.0)

## 2013-08-15 LAB — PROTIME-INR
INR: 1.03 (ref 0.00–1.49)
Prothrombin Time: 13.3 seconds (ref 11.6–15.2)

## 2013-08-15 LAB — CBC
HCT: 44.6 % (ref 39.0–52.0)
Hemoglobin: 15.1 g/dL (ref 13.0–17.0)
MCH: 32.6 pg (ref 26.0–34.0)
MCHC: 33.9 g/dL (ref 30.0–36.0)
MCV: 96.3 fL (ref 78.0–100.0)
PLATELETS: 365 10*3/uL (ref 150–400)
RBC: 4.63 MIL/uL (ref 4.22–5.81)
RDW: 13.1 % (ref 11.5–15.5)
WBC: 8.8 10*3/uL (ref 4.0–10.5)

## 2013-08-15 LAB — SURGICAL PCR SCREEN
MRSA, PCR: NEGATIVE
Staphylococcus aureus: POSITIVE — AB

## 2013-08-15 LAB — APTT: aPTT: 32 seconds (ref 24–37)

## 2013-08-15 LAB — ABO/RH: ABO/RH(D): A NEG

## 2013-08-15 NOTE — Pre-Procedure Instructions (Signed)
EKG REPORT IN EPIC FROM 07-06-13. CXR WAS DONE TODAY PREOP - AT [facility]

## 2013-08-15 NOTE — Patient Instructions (Addendum)
YOUR SURGERY IS SCHEDULED AT [facility]  ON:  Wednesday 7/1  REPORT TO  SHORT STAY CENTER AT:  6:30 AM   PLEASE COME IN THE [place] ENTRANCE AND FOLLOW SIGNS TO SHORT STAY CENTER.  DO NOT EAT OR DRINK ANYTHING AFTER MIDNIGHT THE NIGHT BEFORE YOUR SURGERY.  YOU MAY BRUSH YOUR TEETH, RINSE OUT YOUR MOUTH--BUT NO WATER, NO FOOD, NO CHEWING GUM, NO MINTS, NO CANDIES, NO CHEWING TOBACCO.  PLEASE TAKE THE FOLLOWING MEDICATIONS THE AM OF YOUR SURGERY WITH A FEW SIPS OF WATER:  LEVOTHYROXINE, OXYCODONE, PREVACID  I DO NOT BRING VALUABLES, MONEY, CREDIT CARDS.  DO NOT WEAR JEWELRY, MAKE-UP, NAIL POLISH AND NO METAL PINS OR CLIPS IN YOUR HAIR. CONTACT LENS, DENTURES / PARTIALS, GLASSES SHOULD NOT BE WORN TO SURGERY AND IN MOST CASES-HEARING AIDS WILL NEED TO BE REMOVED.  BRING YOUR GLASSES CASE, ANY EQUIPMENT NEEDED FOR YOUR CONTACT LENS. FOR PATIENTS ADMITTED TO THE HOSPITAL--CHECK OUT TIME THE DAY OF DISCHARGE IS 11:00 AM.  ALL INPATIENT ROOMS ARE PRIVATE - WITH BATHROOM, TELEPHONE, TELEVISION AND WIFI INTERNET.                                                    PLEASE READ OVER ANY  FACT SHEETS THAT YOU WERE GIVEN: MRSA INFORMATION, BLOOD TRANSFUSION INFORMATION, INCENTIVE SPIROMETER INFORMATION.  PLEASE BE AWARE THAT YOU MAY NEED ADDITIONAL BLOOD DRAWN DAY OF YOUR SURGERY  _______________________________________________________________________   [facility] - Preparing for Surgery Before surgery, you can play an important role.  Because skin is not sterile, your skin needs to be as free of germs as possible.  You can reduce the number of germs on your skin by washing with CHG (chlorahexidine gluconate) soap before surgery.  CHG is an antiseptic cleaner which kills germs and bonds with the skin to continue killing germs even after washing. Please DO NOT use if you have an allergy to CHG or antibacterial soaps.  If your skin becomes reddened/irritated stop using the  CHG and inform your nurse when you arrive at Short Stay. Do not shave (including legs and underarms) for at least 48 hours prior to the first CHG shower.  You may shave your face/neck. Please follow these instructions carefully:  1.  Shower with CHG Soap the night before surgery and the  morning of Surgery.  2.  If you choose to wash your hair, wash your hair first as usual with your  normal  shampoo.  3.  After you shampoo, rinse your hair and body thoroughly to remove the  shampoo.                           4.  Use CHG as you would any other liquid soap.  You can apply chg directly  to the skin and wash                       Gently with a scrungie or clean washcloth.  5.  Apply the CHG Soap to your body ONLY FROM THE NECK DOWN.   Do not use on face/ open                           Wound or  open sores. Avoid contact with eyes, ears mouth and genitals (private parts).                       Wash face,  Genitals (private parts) with your normal soap.             6.  Wash thoroughly, paying special attention to the area where your surgery  will be performed.  7.  Thoroughly rinse your body with warm water from the neck down.  8.  DO NOT shower/wash with your normal soap after using and rinsing off  the CHG Soap.                9.  Pat yourself dry with a clean towel.            10.  Wear clean pajamas.            11.  Place clean sheets on your bed the night of your first shower and do not  sleep with pets. Day of Surgery : Do not apply any lotions/deodorants the morning of surgery.  Please wear clean clothes to the hospital/surgery center.  FAILURE TO FOLLOW THESE INSTRUCTIONS MAY RESULT IN THE CANCELLATION OF YOUR SURGERY PATIENT SIGNATURE_________________________________  NURSE SIGNATURE__________________________________  ________________________________________________________________________   Juan Wiggins  An incentive spirometer is a tool that can help keep your lungs clear  and active. This tool measures how well you are filling your lungs with each breath. Taking long deep breaths may help reverse or decrease the chance of developing breathing (pulmonary) problems (especially infection) following:  A long period of time when you are unable to move or be active. BEFORE THE PROCEDURE   If the spirometer includes an indicator to show your best effort, your nurse or respiratory therapist will set it to a desired goal.  If possible, sit up straight or lean slightly forward. Try not to slouch.  Hold the incentive spirometer in an upright position. INSTRUCTIONS FOR USE  1. Sit on the edge of your bed if possible, or sit up as far as you can in bed or on a chair. 2. Hold the incentive spirometer in an upright position. 3. Breathe out normally. 4. Place the mouthpiece in your mouth and seal your lips tightly around it. 5. Breathe in slowly and as deeply as possible, raising the piston or the ball toward the top of the column. 6. Hold your breath for 3-5 seconds or for as long as possible. Allow the piston or ball to fall to the bottom of the column. 7. Remove the mouthpiece from your mouth and breathe out normally. 8. Rest for a few seconds and repeat Steps 1 through 7 at least 10 times every 1-2 hours when you are awake. Take your time and take a few normal breaths between deep breaths. 9. The spirometer may include an indicator to show your best effort. Use the indicator as a goal to work toward during each repetition. 10. After each set of 10 deep breaths, practice coughing to be sure your lungs are clear. If you have an incision (the cut made at the time of surgery), support your incision when coughing by placing a pillow or rolled up towels firmly against it. Once you are able to get out of bed, walk around indoors and cough well. You may stop using the incentive spirometer when instructed by your caregiver.  RISKS AND COMPLICATIONS  Take your time so you do not  get dizzy  or light-headed.  If you are in pain, you may need to take or ask for pain medication before doing incentive spirometry. It is harder to take a deep breath if you are having pain. AFTER USE  Rest and breathe slowly and easily.  It can be helpful to keep track of a log of your progress. Your caregiver can provide you with a simple table to help with this. If you are using the spirometer at home, follow these instructions: [place] IF:   You are having difficultly using the spirometer.  You have trouble using the spirometer as often as instructed.  Your pain medication is not giving enough relief while using the spirometer.  You develop fever of 100.5 F (38.1 C) or higher. SEEK IMMEDIATE MEDICAL CARE IF:   You cough up bloody sputum that had not been present before.  You develop fever of 102 F (38.9 C) or greater.  You develop worsening pain at or near the incision site. MAKE SURE YOU:   Understand these instructions.  Will watch your condition.  Will get help right away if you are not doing well or get worse. Document Released: 06/22/2006 Document Revised: 05/04/2011 Document Reviewed: 08/23/2006 Exit[employer] Patient Information 2014 [employer], [place].   ________________________________________________________________________  WHAT IS A BLOOD TRANSFUSION? Blood Transfusion Information  A transfusion is the replacement of blood or some of its parts. Blood is made up of multiple cells which provide different functions.  Red blood cells carry oxygen and are used for blood loss replacement.  White blood cells fight against infection.  Platelets control bleeding.  Plasma helps clot blood.  Other blood products are available for specialized needs, such as hemophilia or other clotting disorders. BEFORE THE TRANSFUSION  Who gives blood for transfusions?   Healthy volunteers who are fully evaluated to make sure their blood is safe. This is blood bank  blood. Transfusion therapy is the safest it has ever been in the practice of medicine. Before blood is taken from a donor, a complete history is taken to make sure that person has no history of diseases nor engages in risky social behavior (examples are intravenous drug use or sexual activity with multiple partners). The donor's travel history is screened to minimize risk of transmitting infections, such as malaria. The donated blood is tested for signs of infectious diseases, such as HIV and hepatitis. The blood is then tested to be sure it is compatible with you in order to minimize the chance of a transfusion reaction. If you or a relative donates blood, this is often done in anticipation of surgery and is not appropriate for emergency situations. It takes many days to process the donated blood. RISKS AND COMPLICATIONS Although transfusion therapy is very safe and saves many lives, the main dangers of transfusion include:   Getting an infectious disease.  Developing a transfusion reaction. This is an allergic reaction to something in the blood you were given. Every precaution is taken to prevent this. The decision to have a blood transfusion has been considered carefully by your caregiver before blood is given. Blood is not given unless the benefits outweigh the risks. AFTER THE TRANSFUSION  Right after receiving a blood transfusion, you will usually feel much better and more energetic. This is especially true if your red blood cells have gotten low (anemic). The transfusion raises the level of the red blood cells which carry oxygen, and this usually causes an energy increase.  The nurse administering the transfusion will monitor you  carefully for complications. HOME CARE INSTRUCTIONS  No special instructions are needed after a transfusion. You may find your energy is better. Speak with your caregiver about any limitations on activity for underlying diseases you may have. SEEK MEDICAL CARE IF:    Your condition is not improving after your transfusion.  You develop redness or irritation at the intravenous (IV) site. SEEK IMMEDIATE MEDICAL CARE IF:  Any of the following symptoms occur over the next 12 hours:  Shaking chills.  You have a temperature by mouth above 102 F (38.9 C), not controlled by medicine.  Chest, back, or muscle pain.  People around you feel you are not acting correctly or are confused.  Shortness of breath or difficulty breathing.  Dizziness and fainting.  You get a rash or develop hives.  You have a decrease in urine output.  Your urine turns a dark color or changes to pink, red, or brown. Any of the following symptoms occur over the next 10 days:  You have a temperature by mouth above 102 F (38.9 C), not controlled by medicine.  Shortness of breath.  Weakness after normal activity.  The white part of the eye turns yellow (jaundice).  You have a decrease in the amount of urine or are urinating less often.  Your urine turns a dark color or changes to pink, red, or brown. Document Released: 02/07/2000 Document Revised: 05/04/2011 Document Reviewed: 09/26/2007 [facility] Patient Information 2014 [place], [place].  _______________________________________________________________________

## 2013-08-16 ENCOUNTER — Encounter (HOSPITAL_COMMUNITY): Payer: Self-pay

## 2013-08-22 NOTE — H&P (Signed)
TOTAL KNEE ADMISSION H&P  Patient is being admitted for left total knee arthroplasty.  Subjective:  Chief Complaint:left knee pain.  HPI: Juan Wiggins, 63 y.o. male, has a history of pain and functional disability in the left knee due to arthritis and has failed non-surgical conservative treatments for greater than 12 weeks to includeNSAID's and/or analgesics, corticosteriod injections, viscosupplementation injections and activity modification.  Onset of symptoms was gradual, starting 5 years ago with gradually worsening course since that time. The patient noted prior procedures on the knee to include  arthroscopy and menisectomy on the left knee(s).  Patient currently rates pain in the left knee(s) at 6 out of 10 with activity. Patient has night pain, worsening of pain with activity and weight bearing, pain that interferes with activities of daily living, pain with passive range of motion, crepitus and joint swelling.  Patient has evidence of periarticular osteophytes and joint space narrowing by imaging studies. There is no active infection.  Patient Active Problem List   Diagnosis Date Noted  . Alcohol dependence 07/14/2013  . Alcohol dependency 07/07/2013  . Major depression 07/07/2013  . Alcohol dependence with alcohol-induced disorder 07/07/2013  . Varicose veins of lower extremities with other complications 21/19/4174  . Recurrent falls 04/14/2011  . Hypotension 04/13/2011  . ARF (acute renal failure) 04/13/2011  . Hypothyroidism 04/13/2011  . Prolapsed and thrombosed 09/03/2010   Past Medical History  Diagnosis Date  . Hypertension   . Thyroid disease   . Hemorrhoids   . Hypothyroidism   . Tachycardia     PT STATES HIS HEART RATE USUALLY 100 OR MORE  . Depression   . Anxiety   . GERD (gastroesophageal reflux disease)     PREVACID IF NEEDED  . Arthritis     RHEUMATOID ARTHRITIS; OA LEFT KNEE  . Cancer     MELANOMA REMOVED RT SHOULDER  . Hepatitis A     PT STATES TYPE  OF HEPATITIS YOU GET FROM [place]LLFISH  . 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 Sleep apnea     CLAUSTROPHOBIC - COULD NOT TOLERATE CPAP MASK    Past Surgical History  Procedure Laterality Date  . Knee surgery      BILATERAL KNEE ARTHROSCOPY  . Shoulder surgery      RIGHT ROTATOR CUFF REPAIR AND LEFT ARTHROSCOPY  . Elbow surgery      BILATERAL ELBOW SURGERY  . Refractive surgery       Current outpatient prescriptions: ibuprofen (ADVIL,MOTRIN) 200 MG tablet, Take 400 mg by mouth every 6 (six) hours as needed for mild pain., Disp: , Rfl: ;   Lansoprazole (PREVACID PO), Take by mouth as needed., Disp: , Rfl: ;   levothyroxine (SYNTHROID, LEVOTHROID) 200 MCG tablet, Take 200 mcg by mouth daily before breakfast., Disp: , Rfl: ;   lisinopril (PRINIVIL,ZESTRIL) 10 MG tablet, Take 10 mg by mouth every morning., Disp: , Rfl:  Multiple Vitamin (MULTIVITAMIN WITH MINERALS) TABS tablet, Take 1 tablet by mouth daily., Disp: , Rfl: ;   OLANZapine-FLUoxetine (SYMBYAX) 12-50 MG per capsule, Take 1 capsule by mouth at bedtime., Disp: , Rfl: ;   OVER THE COUNTER MEDICATION, Take 2 tablets by mouth 2 (two) times daily. MENTAL CLARITY, Disp: , Rfl: ;   oxycodone (ROXICODONE) 30 MG immediate release tablet, Take 30 mg by mouth 5 (five) times daily., Disp: , Rfl:  zolpidem (AMBIEN) 10 MG tablet, Take 10 mg by mouth  at bedtime as needed for sleep., Disp: , Rfl:   No Known Allergies  History  Substance Use Topics  . Smoking status: Current Every Day Smoker -- 10 years    Types: Cigars  . Smokeless tobacco: Never Used     Comment: used cigarettes 20 years ago  . Alcohol Use: Yes     Comment: 1/2 gal of scotch per day .   PT STATES NOW DRINKING 3 OR 4 GLASSES SCOTCH- HAS BEEN IN REHAB FOR ALCOHOLISM FEB 2015  FORMER CIG[employer]ETTE SMOKER - QUIT 1999     Family History  Problem Relation Age of Onset  . Hypertension Mother   . Other Mother     varicose veins  .  Hypertension Sister      Review of Systems  Constitutional: Positive for chills and malaise/fatigue. Negative for fever, weight loss and diaphoresis.  HENT: Negative.   Eyes: Positive for blurred vision. Negative for double vision, photophobia, pain, discharge and redness.  Respiratory: Positive for shortness of breath. Negative for cough, hemoptysis, sputum production and wheezing.        SOB with exertion  Cardiovascular: Negative.   Gastrointestinal: Negative.   Genitourinary: Negative.   Musculoskeletal: Positive for back pain and joint pain. Negative for falls, myalgias and neck pain.       Left knee pain  Skin: Negative.   Neurological: Negative.  Negative for weakness.  Endo/Heme/Allergies: Negative.   Psychiatric/Behavioral: Negative.     Objective:  Physical Exam  Constitutional: He is oriented to person, place, and time. He appears well-developed and well-nourished. No distress.  HENT:  Head: Normocephalic and atraumatic.  Right Ear: External ear normal.  Left Ear: External ear normal.  Nose: Nose normal.  Mouth/Throat: Oropharynx is clear and moist.  Eyes: Conjunctivae and EOM are normal.  Neck: Normal range of motion. Neck supple.  Cardiovascular: Normal rate, regular rhythm, normal heart sounds and intact distal pulses.   No murmur heard. Respiratory: Effort normal and breath sounds normal. No respiratory distress. He has no wheezes.  GI: Soft. Bowel sounds are normal. He exhibits no distension. There is no tenderness.  Musculoskeletal:       Right hip: Normal.       Left hip: Normal.       Right knee: He exhibits decreased range of motion and swelling. He exhibits no effusion and no laceration. Tenderness found. Medial joint line and lateral joint line tenderness noted.       Left knee: He exhibits decreased range of motion and swelling. He exhibits no effusion and no erythema. Tenderness found. Medial joint line and lateral joint line tenderness noted.        Right lower leg: He exhibits no tenderness and no swelling.       Left lower leg: He exhibits no tenderness and no swelling.  Neurological: He is alert and oriented to person, place, and time. He has normal strength and normal reflexes. No sensory deficit.  Skin: No rash noted. He is not diaphoretic. No erythema.  Psychiatric: He has a normal mood and affect. His behavior is normal.    Vitals Weight: 220 lb Height: 71 in Body Surface Area: 2.24 m Body Mass Index: 30.68 kg/m Pulse: 98 (Regular) BP: 130/84 (Sitting, Left Arm, Standard)  Imaging Review Plain radiographs demonstrate severe degenerative joint disease of the left knee(s). The overall alignment ismild varus. The bone quality appears to be good for age and reported activity level.  Assessment/Plan:  End stage arthritis, left  knee   The patient history, physical examination, clinical judgment of the provider and imaging studies are consistent with end stage degenerative joint disease of the left knee(s) and total knee arthroplasty is deemed medically necessary. The treatment options including medical management, injection therapy arthroscopy and arthroplasty were discussed at length. The risks and benefits of total knee arthroplasty were presented and reviewed. The risks due to aseptic loosening, infection, stiffness, patella tracking problems, thromboembolic complications and other imponderables were discussed. The patient acknowledged the explanation, agreed to proceed with the plan and consent was signed. Patient is being admitted for inpatient treatment for surgery, pain control, PT, OT, prophylactic antibiotics, VTE prophylaxis, progressive ambulation and ADL's and discharge planning. The patient is planning to be discharged home with home health services     [place], [place]

## 2013-08-23 ENCOUNTER — Inpatient Hospital Stay (HOSPITAL_COMMUNITY): Payer: 59 | Admitting: Certified Registered Nurse Anesthetist

## 2013-08-23 ENCOUNTER — Inpatient Hospital Stay (HOSPITAL_COMMUNITY)
Admission: RE | Admit: 2013-08-23 | Discharge: 2013-08-25 | DRG: 470 | Disposition: A | Discharge status: Skilled Nursing Facility | Payer: 59 | Source: Ambulatory Visit | Attending: Orthopedic Surgery | Admitting: Orthopedic Surgery

## 2013-08-23 ENCOUNTER — Encounter (HOSPITAL_COMMUNITY): Payer: 59 | Admitting: Certified Registered Nurse Anesthetist

## 2013-08-23 ENCOUNTER — Encounter (HOSPITAL_COMMUNITY): Payer: Self-pay | Admitting: *Deleted

## 2013-08-23 ENCOUNTER — Encounter (HOSPITAL_COMMUNITY)
Admission: RE | Disposition: A | Discharge status: Skilled Nursing Facility | Payer: Self-pay | Source: Ambulatory Visit | Attending: Orthopedic Surgery

## 2013-08-23 ENCOUNTER — Inpatient Hospital Stay (HOSPITAL_COMMUNITY): Payer: 59

## 2013-08-23 DIAGNOSIS — Z791 Long term (current) use of non-steroidal anti-inflammatories (NSAID): Secondary | ICD-10-CM

## 2013-08-23 DIAGNOSIS — Z8249 Family history of ischemic heart disease and other diseases of the circulatory system: Secondary | ICD-10-CM

## 2013-08-23 DIAGNOSIS — Z96659 Presence of unspecified artificial knee joint: Secondary | ICD-10-CM

## 2013-08-23 DIAGNOSIS — Z9181 History of falling: Secondary | ICD-10-CM

## 2013-08-23 DIAGNOSIS — M1712 Unilateral primary osteoarthritis, left knee: Secondary | ICD-10-CM | POA: Diagnosis present

## 2013-08-23 DIAGNOSIS — K219 Gastro-esophageal reflux disease without esophagitis: Secondary | ICD-10-CM | POA: Diagnosis present

## 2013-08-23 DIAGNOSIS — M21169 Varus deformity, not elsewhere classified, unspecified knee: Secondary | ICD-10-CM | POA: Diagnosis present

## 2013-08-23 DIAGNOSIS — Z79899 Other long term (current) drug therapy: Secondary | ICD-10-CM

## 2013-08-23 DIAGNOSIS — M2540 Effusion, unspecified joint: Secondary | ICD-10-CM | POA: Diagnosis present

## 2013-08-23 DIAGNOSIS — Z85820 Personal history of malignant melanoma of skin: Secondary | ICD-10-CM

## 2013-08-23 DIAGNOSIS — F411 Generalized anxiety disorder: Secondary | ICD-10-CM | POA: Diagnosis present

## 2013-08-23 DIAGNOSIS — Z96652 Presence of left artificial knee joint: Secondary | ICD-10-CM

## 2013-08-23 DIAGNOSIS — F40298 Other specified phobia: Secondary | ICD-10-CM | POA: Diagnosis present

## 2013-08-23 DIAGNOSIS — F329 Major depressive disorder, single episode, unspecified: Secondary | ICD-10-CM | POA: Diagnosis present

## 2013-08-23 DIAGNOSIS — M1710 Unilateral primary osteoarthritis, unspecified knee: Principal | ICD-10-CM | POA: Diagnosis present

## 2013-08-23 DIAGNOSIS — E039 Hypothyroidism, unspecified: Secondary | ICD-10-CM | POA: Diagnosis present

## 2013-08-23 DIAGNOSIS — F17200 Nicotine dependence, unspecified, uncomplicated: Secondary | ICD-10-CM | POA: Diagnosis present

## 2013-08-23 DIAGNOSIS — I10 Essential (primary) hypertension: Secondary | ICD-10-CM | POA: Diagnosis present

## 2013-08-23 DIAGNOSIS — G4730 Sleep apnea, unspecified: Secondary | ICD-10-CM | POA: Diagnosis present

## 2013-08-23 DIAGNOSIS — M069 Rheumatoid arthritis, unspecified: Secondary | ICD-10-CM | POA: Diagnosis present

## 2013-08-23 HISTORY — PX: TOTAL KNEE ARTHROPLASTY: SHX125

## 2013-08-23 LAB — TYPE AND SCREEN
ABO/RH(D): A NEG
Antibody Screen: NEGATIVE

## 2013-08-23 SURGERY — ARTHROPLASTY, KNEE, TOTAL
Anesthesia: General | Site: Knee | Laterality: Left

## 2013-08-23 MED ORDER — OLANZAPINE-FLUOXETINE HCL 12-50 MG PO CAPS
1.0000 | ORAL_CAPSULE | Freq: Every day | ORAL | Status: DC
  Administered 2013-08-23 – 2013-08-24 (×2): 1 via ORAL
  Filled 2013-08-23 (×3): qty 1

## 2013-08-23 MED ORDER — DEXAMETHASONE SODIUM PHOSPHATE 10 MG/ML IJ SOLN
INTRAMUSCULAR | Status: DC | PRN
  Administered 2013-08-23: 10 mg via INTRAVENOUS

## 2013-08-23 MED ORDER — SODIUM CHLORIDE 0.9 % IR SOLN
Status: DC | PRN
  Administered 2013-08-23: 1000 mL

## 2013-08-23 MED ORDER — METHOCARBAMOL 500 MG PO TABS
500.0000 mg | ORAL_TABLET | Freq: Four times a day (QID) | ORAL | Status: DC | PRN
  Administered 2013-08-23 – 2013-08-25 (×6): 500 mg via ORAL
  Filled 2013-08-23 (×6): qty 1

## 2013-08-23 MED ORDER — CEFAZOLIN SODIUM 1-5 GM-% IV SOLN
1.0000 g | Freq: Four times a day (QID) | INTRAVENOUS | Status: AC
  Administered 2013-08-23 (×2): 1 g via INTRAVENOUS
  Filled 2013-08-23 (×2): qty 50

## 2013-08-23 MED ORDER — FENTANYL CITRATE 0.05 MG/ML IJ SOLN
INTRAMUSCULAR | Status: DC | PRN
  Administered 2013-08-23 (×3): 100 ug via INTRAVENOUS
  Administered 2013-08-23: 50 ug via INTRAVENOUS

## 2013-08-23 MED ORDER — GLYCOPYRROLATE 0.2 MG/ML IJ SOLN
INTRAMUSCULAR | Status: DC | PRN
  Administered 2013-08-23: 0.6 mg via INTRAVENOUS

## 2013-08-23 MED ORDER — SODIUM CHLORIDE 0.9 % IJ SOLN
INTRAMUSCULAR | Status: AC
  Filled 2013-08-23: qty 10

## 2013-08-23 MED ORDER — ONDANSETRON HCL 4 MG/2ML IJ SOLN
INTRAMUSCULAR | Status: AC
  Filled 2013-08-23: qty 2

## 2013-08-23 MED ORDER — PROPOFOL 10 MG/ML IV BOLUS
INTRAVENOUS | Status: AC
  Filled 2013-08-23: qty 20

## 2013-08-23 MED ORDER — ROCURONIUM BROMIDE 100 MG/10ML IV SOLN
INTRAVENOUS | Status: DC | PRN
  Administered 2013-08-23: 40 mg via INTRAVENOUS

## 2013-08-23 MED ORDER — HYDROMORPHONE HCL PF 1 MG/ML IJ SOLN
INTRAMUSCULAR | Status: AC
  Filled 2013-08-23: qty 1

## 2013-08-23 MED ORDER — HYDROMORPHONE HCL PF 2 MG/ML IJ SOLN
INTRAMUSCULAR | Status: AC
  Filled 2013-08-23: qty 1

## 2013-08-23 MED ORDER — CEFAZOLIN SODIUM-DEXTROSE 2-3 GM-% IV SOLR
INTRAVENOUS | Status: AC
  Filled 2013-08-23: qty 50

## 2013-08-23 MED ORDER — RIVAROXABAN 10 MG PO TABS
10.0000 mg | ORAL_TABLET | Freq: Every day | ORAL | Status: DC
  Administered 2013-08-24 – 2013-08-25 (×2): 10 mg via ORAL
  Filled 2013-08-23 (×3): qty 1

## 2013-08-23 MED ORDER — KETAMINE HCL 10 MG/ML IJ SOLN
INTRAMUSCULAR | Status: DC | PRN
  Administered 2013-08-23: 30 mg via INTRAVENOUS

## 2013-08-23 MED ORDER — PROPOFOL 10 MG/ML IV BOLUS
INTRAVENOUS | Status: DC | PRN
  Administered 2013-08-23: 150 mg via INTRAVENOUS

## 2013-08-23 MED ORDER — MIDAZOLAM HCL 5 MG/5ML IJ SOLN
INTRAMUSCULAR | Status: DC | PRN
  Administered 2013-08-23: 2 mg via INTRAVENOUS

## 2013-08-23 MED ORDER — ONDANSETRON HCL 4 MG PO TABS: 4.0000 mg | ORAL_TABLET | Freq: Four times a day (QID) | ORAL | Status: DC | PRN

## 2013-08-23 MED ORDER — METHOCARBAMOL 1000 MG/10ML IJ SOLN
500.0000 mg | Freq: Four times a day (QID) | INTRAVENOUS | Status: DC | PRN
  Administered 2013-08-23: 500 mg via INTRAVENOUS
  Filled 2013-08-23: qty 5

## 2013-08-23 MED ORDER — CELECOXIB 200 MG PO CAPS
200.0000 mg | ORAL_CAPSULE | Freq: Two times a day (BID) | ORAL | Status: DC
  Administered 2013-08-23 – 2013-08-25 (×4): 200 mg via ORAL
  Filled 2013-08-23 (×5): qty 1

## 2013-08-23 MED ORDER — PANTOPRAZOLE SODIUM 20 MG PO TBEC
20.0000 mg | DELAYED_RELEASE_TABLET | Freq: Every day | ORAL | Status: DC
  Administered 2013-08-23 – 2013-08-25 (×3): 20 mg via ORAL
  Filled 2013-08-23 (×3): qty 1

## 2013-08-23 MED ORDER — GELATIN ABSORBABLE MT POWD
OROMUCOSAL | Status: DC | PRN
  Administered 2013-08-23: 10:00:00 via TOPICAL

## 2013-08-23 MED ORDER — BUPIVACAINE LIPOSOME 1.3 % IJ SUSP
20.0000 mL | Freq: Once | INTRAMUSCULAR | Status: AC
  Administered 2013-08-23: 20 mL
  Filled 2013-08-23: qty 20

## 2013-08-23 MED ORDER — SODIUM CHLORIDE 0.9 % IJ SOLN
INTRAMUSCULAR | Status: DC | PRN
  Administered 2013-08-23: 20 mL

## 2013-08-23 MED ORDER — FENTANYL CITRATE 0.05 MG/ML IJ SOLN
INTRAMUSCULAR | Status: AC
  Filled 2013-08-23: qty 5

## 2013-08-23 MED ORDER — HYDROMORPHONE HCL PF 1 MG/ML IJ SOLN: 1.0000 mg | INTRAMUSCULAR | Status: DC | PRN

## 2013-08-23 MED ORDER — HYDROCODONE-ACETAMINOPHEN 5-325 MG PO TABS
1.0000 | ORAL_TABLET | ORAL | Status: DC | PRN
  Administered 2013-08-24: 2 via ORAL
  Filled 2013-08-23: qty 2

## 2013-08-23 MED ORDER — FLEET ENEMA 7-19 GM/118ML RE ENEM: 1.0000 | ENEMA | Freq: Once | RECTAL | Status: AC | PRN

## 2013-08-23 MED ORDER — SODIUM CHLORIDE 0.9 % IR SOLN
Status: DC | PRN
  Administered 2013-08-23: 09:00:00

## 2013-08-23 MED ORDER — FENTANYL CITRATE 0.05 MG/ML IJ SOLN
INTRAMUSCULAR | Status: AC
  Filled 2013-08-23: qty 2

## 2013-08-23 MED ORDER — FERROUS SULFATE 325 (65 FE) MG PO TABS
325.0000 mg | ORAL_TABLET | Freq: Three times a day (TID) | ORAL | Status: DC
  Administered 2013-08-23 – 2013-08-25 (×7): 325 mg via ORAL
  Filled 2013-08-23 (×9): qty 1

## 2013-08-23 MED ORDER — HYDROMORPHONE HCL PF 1 MG/ML IJ SOLN
INTRAMUSCULAR | Status: DC | PRN
  Administered 2013-08-23 (×2): 1 mg via INTRAVENOUS
  Administered 2013-08-23: 0.5 mg via INTRAVENOUS
  Administered 2013-08-23: 1 mg via INTRAVENOUS
  Administered 2013-08-23: 0.5 mg via INTRAVENOUS

## 2013-08-23 MED ORDER — KETOROLAC TROMETHAMINE 30 MG/ML IJ SOLN
INTRAMUSCULAR | Status: DC | PRN
  Administered 2013-08-23: 30 mg via INTRAVENOUS

## 2013-08-23 MED ORDER — NEOSTIGMINE METHYLSULFATE 10 MG/10ML IV SOLN
INTRAVENOUS | Status: AC
  Filled 2013-08-23: qty 1

## 2013-08-23 MED ORDER — PHENOL 1.4 % MT LIQD
1.0000 | OROMUCOSAL | Status: DC | PRN
  Filled 2013-08-23: qty 177

## 2013-08-23 MED ORDER — ROCURONIUM BROMIDE 100 MG/10ML IV SOLN
INTRAVENOUS | Status: AC
  Filled 2013-08-23: qty 1

## 2013-08-23 MED ORDER — MENTHOL 3 MG MT LOZG
1.0000 | LOZENGE | OROMUCOSAL | Status: DC | PRN
  Filled 2013-08-23: qty 9

## 2013-08-23 MED ORDER — CEFAZOLIN SODIUM-DEXTROSE 2-3 GM-% IV SOLR
2.0000 g | INTRAVENOUS | Status: AC
  Administered 2013-08-23: 2 g via INTRAVENOUS

## 2013-08-23 MED ORDER — LISINOPRIL 10 MG PO TABS
10.0000 mg | ORAL_TABLET | Freq: Every morning | ORAL | Status: DC
  Administered 2013-08-23 – 2013-08-25 (×3): 10 mg via ORAL
  Filled 2013-08-23 (×3): qty 1

## 2013-08-23 MED ORDER — PROMETHAZINE HCL 25 MG/ML IJ SOLN: 6.2500 mg | INTRAMUSCULAR | Status: DC | PRN

## 2013-08-23 MED ORDER — KETOROLAC TROMETHAMINE 30 MG/ML IJ SOLN
INTRAMUSCULAR | Status: AC
  Filled 2013-08-23: qty 1

## 2013-08-23 MED ORDER — NEOSTIGMINE METHYLSULFATE 10 MG/10ML IV SOLN
INTRAVENOUS | Status: DC | PRN
  Administered 2013-08-23: 5 mg via INTRAVENOUS

## 2013-08-23 MED ORDER — ONDANSETRON HCL 4 MG/2ML IJ SOLN: 4.0000 mg | Freq: Four times a day (QID) | INTRAMUSCULAR | Status: DC | PRN

## 2013-08-23 MED ORDER — POLYETHYLENE GLYCOL 3350 17 G PO PACK
17.0000 g | PACK | Freq: Every day | ORAL | Status: DC | PRN
  Administered 2013-08-25: 17 g via ORAL

## 2013-08-23 MED ORDER — BISACODYL 5 MG PO TBEC: 5.0000 mg | DELAYED_RELEASE_TABLET | Freq: Every day | ORAL | Status: DC | PRN

## 2013-08-23 MED ORDER — LIDOCAINE HCL (CARDIAC) 20 MG/ML IV SOLN
INTRAVENOUS | Status: DC | PRN
  Administered 2013-08-23: 100 mg via INTRAVENOUS

## 2013-08-23 MED ORDER — SODIUM CHLORIDE 0.9 % IJ SOLN
INTRAMUSCULAR | Status: AC
  Filled 2013-08-23: qty 20

## 2013-08-23 MED ORDER — ONDANSETRON HCL 4 MG/2ML IJ SOLN
INTRAMUSCULAR | Status: DC | PRN
  Administered 2013-08-23: 4 mg via INTRAVENOUS

## 2013-08-23 MED ORDER — DEXAMETHASONE SODIUM PHOSPHATE 10 MG/ML IJ SOLN
INTRAMUSCULAR | Status: AC
  Filled 2013-08-23: qty 1

## 2013-08-23 MED ORDER — EPHEDRINE SULFATE 50 MG/ML IJ SOLN
INTRAMUSCULAR | Status: AC
  Filled 2013-08-23: qty 1

## 2013-08-23 MED ORDER — LACTATED RINGERS IV SOLN
INTRAVENOUS | Status: DC
  Administered 2013-08-23: 16:00:00 via INTRAVENOUS

## 2013-08-23 MED ORDER — ZOLPIDEM TARTRATE 10 MG PO TABS
10.0000 mg | ORAL_TABLET | Freq: Every evening | ORAL | Status: DC | PRN
  Administered 2013-08-23 – 2013-08-24 (×2): 10 mg via ORAL
  Filled 2013-08-23 (×2): qty 1

## 2013-08-23 MED ORDER — ALUM & MAG HYDROXIDE-SIMETH 200-200-20 MG/5ML PO SUSP: 30.0000 mL | ORAL | Status: DC | PRN

## 2013-08-23 MED ORDER — LEVOTHYROXINE SODIUM 200 MCG PO TABS
200.0000 ug | ORAL_TABLET | Freq: Every day | ORAL | Status: DC
  Administered 2013-08-24 – 2013-08-25 (×2): 200 ug via ORAL
  Filled 2013-08-23 (×3): qty 1

## 2013-08-23 MED ORDER — MIDAZOLAM HCL 2 MG/2ML IJ SOLN
INTRAMUSCULAR | Status: AC
  Filled 2013-08-23: qty 2

## 2013-08-23 MED ORDER — SUCCINYLCHOLINE CHLORIDE 20 MG/ML IJ SOLN
INTRAMUSCULAR | Status: DC | PRN
  Administered 2013-08-23: 100 mg via INTRAVENOUS

## 2013-08-23 MED ORDER — LACTATED RINGERS IV SOLN
INTRAVENOUS | Status: DC
  Administered 2013-08-23 (×3): via INTRAVENOUS

## 2013-08-23 MED ORDER — OXYCODONE-ACETAMINOPHEN 5-325 MG PO TABS
2.0000 | ORAL_TABLET | ORAL | Status: DC | PRN
  Administered 2013-08-23 – 2013-08-24 (×5): 2 via ORAL
  Filled 2013-08-23 (×6): qty 2

## 2013-08-23 MED ORDER — CHLORHEXIDINE GLUCONATE 4 % EX LIQD: 60.0000 mL | Freq: Once | CUTANEOUS | Status: DC

## 2013-08-23 MED ORDER — THROMBIN 5000 UNITS EX SOLR
CUTANEOUS | Status: AC
  Filled 2013-08-23: qty 5000

## 2013-08-23 MED ORDER — GLYCOPYRROLATE 0.2 MG/ML IJ SOLN
INTRAMUSCULAR | Status: AC
  Filled 2013-08-23: qty 3

## 2013-08-23 MED ORDER — HYDROMORPHONE HCL PF 1 MG/ML IJ SOLN
0.2500 mg | INTRAMUSCULAR | Status: DC | PRN
  Administered 2013-08-23 (×3): 0.5 mg via INTRAVENOUS

## 2013-08-23 MED ORDER — LIDOCAINE HCL (CARDIAC) 20 MG/ML IV SOLN
INTRAVENOUS | Status: AC
  Filled 2013-08-23: qty 5

## 2013-08-23 MED ORDER — KETAMINE HCL 10 MG/ML IJ SOLN
INTRAMUSCULAR | Status: AC
  Filled 2013-08-23: qty 1

## 2013-08-23 SURGICAL SUPPLY — 72 items
BAG ZIPLOCK 12X15 (MISCELLANEOUS) IMPLANT
BANDAGE ELASTIC 4 VELCRO ST LF (GAUZE/BANDAGES/DRESSINGS) ×3 IMPLANT
BANDAGE ELASTIC 6 VELCRO ST LF (GAUZE/BANDAGES/DRESSINGS) ×3 IMPLANT
BANDAGE ESMARK 6X9 LF (GAUZE/BANDAGES/DRESSINGS) ×1 IMPLANT
BLADE SAG 18X100X1.27 (BLADE) ×3 IMPLANT
BLADE SAW SGTL 11.0X1.19X90.0M (BLADE) ×3 IMPLANT
BNDG ESMARK 6X9 LF (GAUZE/BANDAGES/DRESSINGS) ×3
BONE CEMENT GENTAMICIN (Cement) ×6 IMPLANT
CAPT RP KNEE ×3 IMPLANT
CEMENT BONE GENTAMICIN 40 (Cement) ×2 IMPLANT
CUFF TOURN SGL QUICK 34 (TOURNIQUET CUFF) ×2
CUFF TRNQT CYL 34X4X40X1 (TOURNIQUET CUFF) ×1 IMPLANT
DERMABOND ADVANCED (GAUZE/BANDAGES/DRESSINGS) ×2
DERMABOND ADVANCED .7 DNX12 (GAUZE/BANDAGES/DRESSINGS) ×1 IMPLANT
DRAPE EXTREMITY T 121X128X90 (DRAPE) ×3 IMPLANT
DRAPE INCISE IOBAN 66X45 STRL (DRAPES) ×3 IMPLANT
DRAPE LG THREE QUARTER DISP (DRAPES) ×3 IMPLANT
DRAPE POUCH INSTRU U-SHP 10X18 (DRAPES) ×3 IMPLANT
DRAPE U-SHAPE 47X51 STRL (DRAPES) ×3 IMPLANT
DRSG AQUACEL AG ADV 3.5X10 (GAUZE/BANDAGES/DRESSINGS) ×3 IMPLANT
DRSG PAD ABDOMINAL 8X10 ST (GAUZE/BANDAGES/DRESSINGS) IMPLANT
DRSG TEGADERM 4X4.75 (GAUZE/BANDAGES/DRESSINGS) ×3 IMPLANT
DURAPREP 26ML APPLICATOR (WOUND CARE) ×3 IMPLANT
ELECT REM PT RETURN 9FT ADLT (ELECTROSURGICAL) ×3
ELECTRODE REM PT RTRN 9FT ADLT (ELECTROSURGICAL) ×1 IMPLANT
EVACUATOR 1/8 PVC DRAIN (DRAIN) ×3 IMPLANT
FACESHIELD WRAPAROUND (MASK) ×12 IMPLANT
GAUZE SPONGE 2X2 8PLY STRL LF (GAUZE/BANDAGES/DRESSINGS) ×1 IMPLANT
GLOVE BIOGEL PI IND STRL 6.5 (GLOVE) ×1 IMPLANT
GLOVE BIOGEL PI IND STRL 7.5 (GLOVE) ×1 IMPLANT
GLOVE BIOGEL PI IND STRL 8 (GLOVE) ×2 IMPLANT
GLOVE BIOGEL PI INDICATOR 6.5 (GLOVE) ×2
GLOVE BIOGEL PI INDICATOR 7.5 (GLOVE) ×2
GLOVE BIOGEL PI INDICATOR 8 (GLOVE) ×4
GLOVE ECLIPSE 7.5 STRL STRAW (GLOVE) ×3 IMPLANT
GLOVE ECLIPSE 8.0 STRL XLNG CF (GLOVE) ×6 IMPLANT
GLOVE SURG SS PI 6.5 STRL IVOR (GLOVE) ×6 IMPLANT
GLOVE SURG SS PI 7.5 STRL IVOR (GLOVE) ×3 IMPLANT
GOWN BRE IMP PREV XXLGXLNG (GOWN DISPOSABLE) ×3 IMPLANT
GOWN SPEC L3 XXLG W/TWL (GOWN DISPOSABLE) ×3 IMPLANT
GOWN STRL REUS W/TWL LRG LVL3 (GOWN DISPOSABLE) ×3 IMPLANT
GOWN STRL REUS W/TWL XL LVL3 (GOWN DISPOSABLE) ×3 IMPLANT
HANDPIECE INTERPULSE COAX TIP (DISPOSABLE) ×2
IMMOBILIZER KNEE 20 (SOFTGOODS) ×3
IMMOBILIZER KNEE 20 THIGH 36 (SOFTGOODS) ×1 IMPLANT
KIT BASIN OR (CUSTOM PROCEDURE TRAY) ×3 IMPLANT
MANIFOLD NEPTUNE II (INSTRUMENTS) ×3 IMPLANT
NEEDLE HYPO 22GX1.5 SAFETY (NEEDLE) ×3 IMPLANT
NS IRRIG 1000ML POUR BTL (IV SOLUTION) IMPLANT
PACK TOTAL JOINT (CUSTOM PROCEDURE TRAY) ×3 IMPLANT
PADDING CAST COTTON 6X4 STRL (CAST SUPPLIES) IMPLANT
POSITIONER SURGICAL ARM (MISCELLANEOUS) ×3 IMPLANT
SET HNDPC FAN SPRY TIP SCT (DISPOSABLE) ×1 IMPLANT
SPONGE GAUZE 2X2 STER 10/PKG (GAUZE/BANDAGES/DRESSINGS) ×2
SPONGE LAP 18X18 X RAY DECT (DISPOSABLE) IMPLANT
SPONGE SURGIFOAM ABS GEL 100 (HEMOSTASIS) ×3 IMPLANT
STAPLER VISISTAT 35W (STAPLE) IMPLANT
SUCTION FRAZIER 12FR DISP (SUCTIONS) ×3 IMPLANT
SUT BONE WAX W31G (SUTURE) ×3 IMPLANT
SUT MNCRL AB 4-0 PS2 18 (SUTURE) ×3 IMPLANT
SUT VIC AB 1 CT1 27 (SUTURE) ×4
SUT VIC AB 1 CT1 27XBRD ANTBC (SUTURE) ×2 IMPLANT
SUT VIC AB 2-0 CT1 27 (SUTURE) ×6
SUT VIC AB 2-0 CT1 TAPERPNT 27 (SUTURE) ×3 IMPLANT
SUT VLOC 180 0 24IN GS25 (SUTURE) ×3 IMPLANT
SYRINGE 20CC LL (MISCELLANEOUS) ×6 IMPLANT
TOWEL OR 17X26 10 PK STRL BLUE (TOWEL DISPOSABLE) ×3 IMPLANT
TOWEL OR NON WOVEN STRL DISP B (DISPOSABLE) ×3 IMPLANT
TOWER CARTRIDGE SMART MIX (DISPOSABLE) ×3 IMPLANT
TRAY FOLEY CATH 16FRSI W/METER (SET/KITS/TRAYS/PACK) ×3 IMPLANT
WATER STERILE IRR 1500ML POUR (IV SOLUTION) ×3 IMPLANT
WRAP KNEE MAXI GEL POST OP (GAUZE/BANDAGES/DRESSINGS) ×3 IMPLANT

## 2013-08-23 NOTE — Brief Op Note (Signed)
08/23/2013  10:21 AM  PATIENT:  [name]  63 y.o. male  PRE-OPERATIVE DIAGNOSIS:  OSTEOARTHRITIS LEFT KNEE  POST-OPERATIVE DIAGNOSIS:  OSTEOARTHRITIS LEFT KNEE  PROCEDURE:  Procedure(s): LEFT TOTAL KNEE ARTHROPLASTY (Left)  SURGEON:  Surgeon(s) and Role:    * Tobi Bastos, MD - Primary  PHYSICIAN ASSISTANT:Amber Nashville PA   ASSISTANTS: Ardeen Jourdain PA  ANESTHESIA:   general  EBL:  Total I/O In: 1000 [I.V.:1000] Out: 150 [Urine:150]  BLOOD ADMINISTERED:none  DRAINS: (One) Hemovact drain(s) in the Left Knee with  Suction Open   LOCAL MEDICATIONS USED:  BUPIVICAINE 20cc mixed with 20cc of Normal Saline  SPECIMEN:  No Specimen  DISPOSITION OF SPECIMEN:  N/A  COUNTS:  YES  TOURNIQUET:  * Missing tourniquet times found for documented tourniquets in log:  [id number] *  DICTATION: .Other Dictation: Dictation Number [id number]  PLAN OF CARE: Admit to inpatient   PATIENT DISPOSITION:  Stable in OR   Delay start of Pharmacological VTE agent (>24hrs) due to surgical blood loss or risk of bleeding: yes

## 2013-08-23 NOTE — Progress Notes (Signed)
PT Cancellation Note  Patient Details Name: Juan Wiggins MRN: [id number] DOB: 03-Nov-1950   Cancelled Treatment:     POD 0 eval deferred at request of RN 2* pt's pain level.  Will follow in am.   [name] 08/23/2013, 3:57 PM

## 2013-08-23 NOTE — Op Note (Signed)
NAME:  Juan Wiggins, Juan Wiggins NO.:  [id number]  MEDICAL RECORD NO.:  [id number]  LOCATION:  [facility]                         FACILITY:  [facility]  PHYSICIAN:  Kipp Brood. Ezekiah Massie, M.D.DATE OF BIRTH:  01-26-1951  DATE OF PROCEDURE:  08/23/2013 DATE OF DISCHARGE:                              OPERATIVE REPORT   SURGEON:  Kipp Brood. Gladstone Lighter, MD.  ASSISTANT:  Ardeen Jourdain, PA.  PREOPERATIVE DIAGNOSIS:  Severe degenerative arthritis with bone-on- bone, left knee.  POSTOPERATIVE DIAGNOSIS:  Severe degenerative arthritis with bone-on- bone, left knee.  OPERATION:  Left total knee arthroplasty utilizing DePuy system.  All 3 components were cemented and gentamicin was used in the cement.  The size of the component was a size 4 left posterior cruciate sacrificing femoral component, tibial tray was a size 4.  The insert was a size 4, 10-mm thickness rotating platform.  The patella was a size 41 with 3 pegs.  PROCEDURE:  Under general anesthesia, routine orthopedic prepping and draping of the left lower extremity was carried out.  The appropriate time-out was carried out.  I also marked the appropriate left leg in the holding area.  At this time, the patient had 2 g of IV Ancef.  The leg was exsanguinated with an Esmarch, tourniquet was elevated at 325 mmHg. We checked once again, made sure everybody agreed on the time-out. Incision was made with the knee flexed.  I then extended the knee, created 2 flaps, and carried out a median parapatellar approach reflected patella laterally.  I then did medial lateral meniscectomies and excised the anterior and posterior cruciate ligaments.  I did a synovectomy.  At this time, initial drill hole was made in the intercondylar notch.  I then inserted the canal finder and thoroughly irrigated out the canal.  Following that, I then measured the distal femur to be a size 4, made the appropriate anterior, posterior and chamfering cuts for size 4  left femur.  After that, we then went down to prepare the tibia.  I utilized the intramedullary guide rod for the tibia.  We removed 8-mm thickness off the tibial plateau.  Following that, we then inserted our lamina spreaders, removed posterior spurs in the femoral condyle.  At that time, I made sure, I had good releases posteriorly and medially.  He had rather significant genu varus.  The spacer blocks were inserted.  We accepted the 10-mm thickness spacer blocks.  I then thoroughly water picked out the knee, and then we inserted our trial components.  We then did a resurfacing procedure on the patella.  Three drill holes were made for a size 41 patella.  Trial components were removed.  We thoroughly water picked out the knee again, cemented all 3 components in simultaneously with gentamicin in the cement.  All loose pieces of cement were removed.  Following that, I then inserted some thrombin-soaked Gelfoam posteriorly.  I removed my trial tibial component, inserted the permanent size 4 10-mm thickness rotating platform.  The knee was reduced, a Hemovac drain was inserted, and then the knee was closed in layers in usual fashion.  He had 2 g of IV Ancef preop.  Sterile  dressings were applied.          ______________________________ Kipp Brood Gladstone Lighter, M.D.     RAG/MEDQ  D:  08/23/2013  T:  08/23/2013  Job:  [id number]

## 2013-08-23 NOTE — Transfer of Care (Signed)
Immediate Anesthesia Transfer of Care Note  Patient: Juan Wiggins  Procedure(s) Performed: Procedure(s): LEFT TOTAL KNEE ARTHROPLASTY (Left)  Patient Location: PACU  Anesthesia Type:General  Level of Consciousness: awake, alert  and oriented  Airway & Oxygen Therapy: Patient Spontanous Breathing and Patient connected to face mask oxygen  Post-op Assessment: Report given to PACU RN and Post -op Vital signs reviewed and stable  Post vital signs: Reviewed and stable  Complications: No apparent anesthesia complications

## 2013-08-23 NOTE — Anesthesia Postprocedure Evaluation (Signed)
  Anesthesia Post-op Note  Patient: Juan Wiggins  Procedure(s) Performed: Procedure(s) (LRB): LEFT TOTAL KNEE ARTHROPLASTY (Left)  Patient Location: PACU  Anesthesia Type: General  Level of Consciousness: awake and alert   Airway and Oxygen Therapy: Patient Spontanous Breathing  Post-op Pain: mild  Post-op Assessment: Post-op Vital signs reviewed, Patient's Cardiovascular Status Stable, Respiratory Function Stable, Patent Airway and No signs of Nausea or vomiting  Last Vitals:  Filed Vitals:   08/23/13 1541  BP: 149/91  Pulse: 106  Temp: 37.1 C  Resp: 18    Post-op Vital Signs: stable   Complications: No apparent anesthesia complications

## 2013-08-23 NOTE — Interval H&P Note (Signed)
History and Physical Interval Note:  08/23/2013 7:59 AM  Juan Wiggins  has presented today for surgery, with the diagnosis of OA LEFT KNEE  The various methods of treatment have been discussed with the patient and family. After consideration of risks, benefits and other options for treatment, the patient has consented to  Procedure(s): LEFT TOTAL KNEE ARTHROPLASTY (Left) as a surgical intervention .  The patient's history has been reviewed, patient examined, no change in status, stable for surgery.  I have reviewed the patient's chart and labs.  Questions were answered to the patient's satisfaction.     [name]

## 2013-08-23 NOTE — Anesthesia Preprocedure Evaluation (Addendum)
Anesthesia Evaluation  Patient identified by MRN, date of birth, ID band Patient awake    Reviewed: Allergy & Precautions, H&P , NPO status , Patient's Chart, lab work & pertinent test results  Airway Mallampati: II TM Distance: >3 FB Neck ROM: Full    Dental no notable dental hx.    Pulmonary sleep apnea , Current Smoker,  breath sounds clear to auscultation  Pulmonary exam normal       Cardiovascular Exercise Tolerance: Good hypertension, Pt. on medications + Peripheral Vascular Disease Rhythm:Regular Rate:Normal     Neuro/Psych PSYCHIATRIC DISORDERS Anxiety Depression Severe spinal stenosis, and back surgery pending negative neurological ROS     GI/Hepatic negative GI ROS, Neg liver ROS, GERD-  Medicated,(+) Hepatitis -, A  Endo/Other  negative endocrine ROSHypothyroidism   Renal/GU Renal disease  negative genitourinary   Musculoskeletal negative musculoskeletal ROS (+)   Abdominal   Peds negative pediatric ROS (+)  Hematology negative hematology ROS (+)   Anesthesia Other Findings   Reproductive/Obstetrics negative OB ROS                         Anesthesia Physical Anesthesia Plan  ASA: II  Anesthesia Plan: General   Post-op Pain Management:    Induction: Intravenous  Airway Management Planned: Oral ETT  Additional Equipment:   Intra-op Plan:   Post-operative Plan: Extubation in OR  Informed Consent: I have reviewed the patients History and Physical, chart, labs and discussed the procedure including the risks, benefits and alternatives for the proposed anesthesia with the patient or authorized representative who has indicated his/her understanding and acceptance.   Dental advisory given  Plan Discussed with: CRNA  Anesthesia Plan Comments: (Discussed general and spinal. Plan general.)       Anesthesia Quick Evaluation

## 2013-08-24 LAB — BASIC METABOLIC PANEL
Anion gap: 11 (ref 5–15)
BUN: 13 mg/dL (ref 6–23)
CO2: 25 mEq/L (ref 19–32)
Calcium: 9 mg/dL (ref 8.4–10.5)
Chloride: 99 mEq/L (ref 96–112)
Creatinine, Ser: 1.18 mg/dL (ref 0.50–1.35)
GFR calc Af Amer: 75 mL/min — ABNORMAL LOW (ref >90)
GFR calc non Af Amer: 65 mL/min — ABNORMAL LOW (ref >90)
Glucose, Bld: 129 mg/dL — ABNORMAL HIGH (ref 70–99)
Potassium: 5 mEq/L (ref 3.7–5.3)
Sodium: 135 mEq/L — ABNORMAL LOW (ref 137–147)

## 2013-08-24 LAB — CBC
HCT: 34.2 % — ABNORMAL LOW (ref 39.0–52.0)
Hemoglobin: 11.5 g/dL — ABNORMAL LOW (ref 13.0–17.0)
MCH: 31.9 pg (ref 26.0–34.0)
MCHC: 33.6 g/dL (ref 30.0–36.0)
MCV: 94.7 fL (ref 78.0–100.0)
Platelets: 323 10*3/uL (ref 150–400)
RBC: 3.61 MIL/uL — AB (ref 4.22–5.81)
RDW: 12.7 % (ref 11.5–15.5)
WBC: 13.7 10*3/uL — ABNORMAL HIGH (ref 4.0–10.5)

## 2013-08-24 MED ORDER — CHLORDIAZEPOXIDE HCL 25 MG PO CAPS: 25.0000 mg | ORAL_CAPSULE | Freq: Three times a day (TID) | ORAL | Status: DC

## 2013-08-24 MED ORDER — VITAMIN B-1 100 MG PO TABS
100.0000 mg | ORAL_TABLET | Freq: Every day | ORAL | Status: DC
  Administered 2013-08-25: 100 mg via ORAL
  Filled 2013-08-24: qty 1

## 2013-08-24 MED ORDER — CHLORDIAZEPOXIDE HCL 25 MG PO CAPS
25.0000 mg | ORAL_CAPSULE | Freq: Four times a day (QID) | ORAL | Status: DC
  Administered 2013-08-24 – 2013-08-25 (×4): 25 mg via ORAL
  Filled 2013-08-24 (×4): qty 1

## 2013-08-24 MED ORDER — CHLORDIAZEPOXIDE HCL 25 MG PO CAPS: 25.0000 mg | ORAL_CAPSULE | ORAL | Status: DC

## 2013-08-24 MED ORDER — CHLORDIAZEPOXIDE HCL 25 MG PO CAPS: 25.0000 mg | ORAL_CAPSULE | Freq: Once | ORAL | Status: DC

## 2013-08-24 MED ORDER — RIVAROXABAN 10 MG PO TABS: 10.0000 mg | ORAL_TABLET | Freq: Every day | ORAL | Status: DC

## 2013-08-24 MED ORDER — OXYCODONE-ACETAMINOPHEN 5-325 MG PO TABS
2.0000 | ORAL_TABLET | ORAL | Status: DC | PRN
  Administered 2013-08-24: 2 via ORAL

## 2013-08-24 MED ORDER — METHOCARBAMOL 500 MG PO TABS: 500.0000 mg | ORAL_TABLET | Freq: Four times a day (QID) | ORAL | Status: DC | PRN

## 2013-08-24 MED ORDER — LOPERAMIDE HCL 2 MG PO CAPS: 2.0000 mg | ORAL_CAPSULE | ORAL | Status: DC | PRN

## 2013-08-24 MED ORDER — CHLORDIAZEPOXIDE HCL 25 MG PO CAPS: 25.0000 mg | ORAL_CAPSULE | Freq: Four times a day (QID) | ORAL | Status: DC | PRN

## 2013-08-24 MED ORDER — FERROUS SULFATE 325 (65 FE) MG PO TABS: 325.0000 mg | ORAL_TABLET | Freq: Three times a day (TID) | ORAL | Status: DC

## 2013-08-24 MED ORDER — OXYCODONE-ACETAMINOPHEN 5-325 MG PO TABS: 2.0000 | ORAL_TABLET | ORAL | Status: DC | PRN

## 2013-08-24 MED ORDER — ONDANSETRON 4 MG PO TBDP
4.0000 mg | ORAL_TABLET | Freq: Four times a day (QID) | ORAL | Status: DC | PRN
  Filled 2013-08-24: qty 1

## 2013-08-24 MED ORDER — OXYCODONE HCL 5 MG PO TABS
5.0000 mg | ORAL_TABLET | ORAL | Status: DC | PRN
Start: 2013-08-24 — End: 2013-08-25
  Administered 2013-08-24 (×2): 20 mg via ORAL
  Administered 2013-08-24: 15 mg via ORAL
  Administered 2013-08-24 – 2013-08-25 (×4): 20 mg via ORAL
  Filled 2013-08-24 (×2): qty 4
  Filled 2013-08-24: qty 3
  Filled 2013-08-24 (×4): qty 4

## 2013-08-24 MED ORDER — CHLORDIAZEPOXIDE HCL 25 MG PO CAPS: 25.0000 mg | ORAL_CAPSULE | Freq: Every day | ORAL | Status: DC

## 2013-08-24 MED ORDER — HYDROXYZINE HCL 25 MG PO TABS
25.0000 mg | ORAL_TABLET | Freq: Four times a day (QID) | ORAL | Status: DC | PRN
  Filled 2013-08-24: qty 1

## 2013-08-24 MED ORDER — THIAMINE HCL 100 MG/ML IJ SOLN
100.0000 mg | Freq: Once | INTRAMUSCULAR | Status: AC
  Administered 2013-08-24: 100 mg via INTRAMUSCULAR
  Filled 2013-08-24: qty 1

## 2013-08-24 MED ORDER — ADULT MULTIVITAMIN W/MINERALS CH
1.0000 | ORAL_TABLET | Freq: Every day | ORAL | Status: DC
  Administered 2013-08-24 – 2013-08-25 (×2): 1 via ORAL
  Filled 2013-08-24 (×2): qty 1

## 2013-08-24 NOTE — Progress Notes (Signed)
Subjective: Hemovac Dcd. No post-op problems.   Objective: Vital signs in last 24 hours: Temp:  [97.9 F (36.6 C)-99.1 F (37.3 C)] 98.6 F (37 C) (07/02 0609) Pulse Rate:  [73-106] 73 (07/02 0609) Resp:  [10-22] 16 (07/02 0609) BP: (146-168)/(76-103) 146/76 mmHg (07/02 0609) SpO2:  [88 %-100 %] 98 % (07/02 0609) FiO2 (%):  [97 %] 97 % (07/01 1240) Weight:  [93.441 kg (206 lb)] 93.441 kg (206 lb) (07/01 1240)  Intake/Output from previous day: 07/01 0701 - 07/02 0700 In: 4631.7 [P.O.:600; I.V.:3931.7; IV Piggyback:100] Out: 1188 [Urine:3130; Drains:55] Intake/Output this shift:     Recent Labs  08/24/13 0500  HGB 11.5*    Recent Labs  08/24/13 0500  WBC 13.7*  RBC 3.61*  HCT 34.2*  PLT 323    Recent Labs  08/24/13 0500  NA 135*  K 5.0  CL 99  CO2 25  BUN 13  CREATININE 1.18  GLUCOSE 129*  CALCIUM 9.0   No results found for this basename: LABPT, INR,  in the last 72 hours  Neurologically intact Dorsiflexion/Plantar flexion intact  Assessment/Plan: DC Friday or Saturday.   Juan Wiggins A 08/24/2013, 7:18 AM

## 2013-08-24 NOTE — Evaluation (Signed)
Occupational Therapy Evaluation Patient Details Name: Juan Wiggins MRN: [id number] DOB: 12-24-1950 Today's Date: 08/24/2013    History of Present Illness Pt is s/p L TKA   Clinical Impression   This 63 year old man was admitted for the above surgery.  Prior to surgery, he was independent with adls.  He will benefit from skilled OT to reach supervision level in acute.  He now requires min A overall.  Pt lives with elderly father who has a caregiver.  He will benefit from SNF to reach a mod I level.    Follow Up Recommendations  SNF    Equipment Recommendations   (to be further assessed:  possibly 3:1)    Recommendations for Other Services       Precautions / Restrictions Precautions Precautions: Knee Required Braces or Orthoses: Knee Immobilizer - Left Restrictions Weight Bearing Restrictions: No      Mobility Bed Mobility                  Transfers Overall transfer level: Needs assistance Equipment used: Rolling walker (2 wheeled) Transfers: Sit to/from Stand Sit to Stand: Min assist         General transfer comment: cue for LE management    Balance                                            ADL Overall ADL's : Needs assistance/impaired     Grooming: Set up;Sitting   Upper Body Bathing: Set up;Sitting   Lower Body Bathing: Minimal assistance;Sit to/from stand   Upper Body Dressing : Set up;Sitting   Lower Body Dressing: Minimal assistance;Sit to/from stand                 General ADL Comments: Educated on AE and pt practiced with reacher and sock aide.  He plans other knee then back surgery, so would benefit from AE.  pt is able to lift LLE with KI on.  Stood and stepped forward and backwards:  one cue for LE placement.       Vision                     Perception     Praxis      Pertinent Vitals/Pain 3/10 L knee.  Repositioned and reapplied ice     Hand Dominance     Extremity/Trunk Assessment Upper  Extremity Assessment Upper Extremity Assessment: Overall WFL for tasks assessed       Cervical / Trunk Assessment Cervical / Trunk Assessment:  (pt states he needs back surgery after other knee is done)   Communication Communication Communication: No difficulties   Cognition Arousal/Alertness: Awake/alert Behavior During Therapy: WFL for tasks assessed/performed Overall Cognitive Status: Within Functional Limits for tasks assessed                     General Comments       Exercises       Shoulder Instructions      Home Living Family/patient expects to be discharged to:: Unsure                                 Additional Comments: Lives with elderly father who has a caregiver.  Pt has a tub with grab bar, upstairs standard commode  has grab bar, and he can borrow reacher and sock aide.  He has a long brush      Prior Functioning/Environment Level of Independence: Independent             OT Diagnosis: Generalized weakness   OT Problem List: Decreased strength;Decreased activity tolerance;Decreased knowledge of use of DME or AE;Pain   OT Treatment/Interventions: Self-care/ADL training;DME and/or AE instruction;Patient/family education    OT Goals(Current goals can be found in the care plan section) Acute Rehab OT Goals Patient Stated Goal: Get healed so I can have other knee then back surgery OT Goal Formulation: With patient Time For Goal Achievement: 08/31/13 Potential to Achieve Goals: Good ADL Goals Pt Will Perform Grooming: with supervision;standing Pt Will Perform Lower Body Bathing: with supervision;with adaptive equipment;sit to/from stand Pt Will Perform Lower Body Dressing: with supervision;with max assist;sit to/from stand Pt Will Transfer to Toilet: with supervision;ambulating;bedside commode;regular height toilet;grab bars (vs) Pt Will Perform Toileting - Clothing Manipulation and hygiene: with supervision;sit to/from stand  OT  Frequency: Min 2X/week   Barriers to D/C:            Co-evaluation              End of Session    Activity Tolerance: Patient tolerated treatment well Patient left: in chair;with call bell/phone within reach;with family/visitor present   Time: 1341-1404 OT Time Calculation (min): 23 min Charges:  OT General Charges $OT Visit: 1 Procedure OT Evaluation $Initial OT Evaluation Tier I: 1 Procedure OT Treatments $Self Care/Home Management : 8-22 mins G-Codes:    Mercedees Convery September 07, 2013, 2:46 PM  Lesle Chris, OTR/L [phone] 09-07-2013

## 2013-08-24 NOTE — Evaluation (Signed)
Physical Therapy Evaluation Patient Details Name: Juan Wiggins MRN: [id number] DOB: January 02, 1951 Today's Date: 08/24/2013   History of Present Illness  Pt is s/p L TKA  Clinical Impression  Pt s/p L TKR presents with decreased L LE strength/ROM and post op pain limiting functional mobility.      Follow Up Recommendations Home health PT;SNF (dependent on acute stay progress)    Equipment Recommendations  None recommended by PT    Recommendations for Other Services OT consult     Precautions / Restrictions Precautions Precautions: Knee Required Braces or Orthoses: Knee Immobilizer - Left Knee Immobilizer - Left: Discontinue once straight leg raise with < 10 degree lag Restrictions Weight Bearing Restrictions: No Other Position/Activity Restrictions: WBAT      Mobility  Bed Mobility Overal bed mobility: Needs Assistance Bed Mobility: Supine to Sit     Supine to sit: Min assist     General bed mobility comments: cues for sequence and use of R LE to self assist  Transfers Overall transfer level: Needs assistance Equipment used: Rolling walker (2 wheeled) Transfers: Sit to/from Stand Sit to Stand: Min assist         General transfer comment: cue for LE management  Ambulation/Gait Ambulation/Gait assistance: Min assist Ambulation Distance (Feet): 26 Feet (and 15) Assistive device: Rolling walker (2 wheeled) Gait Pattern/deviations: Step-to pattern;Decreased step length - right;Decreased step length - left;Shuffle;Trunk flexed Gait velocity: decr   General Gait Details: cues for sequence, posture and position from [employer]            Wheelchair Mobility    Modified Rankin (Stroke Patients Only)       Balance                                             Pertinent Vitals/Pain 6/10; meds requested, cold packs provided    Home Living Family/patient expects to be discharged to:: Unsure Living Arrangements: Parent Available Help  at Discharge: Other (Comment) Type of Home: House Home Access: Level entry     Home Layout: Able to live on main level with bedroom/bathroom Home Equipment: Gilford Rile - 2 wheels Additional Comments: Lives with elderly father who has a caregiver.  Pt has a tub with grab bar, upstairs standard commode has grab bar, and he can borrow reacher and sock aide.  He has a long brush    Prior Function Level of Independence: Independent               Hand Dominance        Extremity/Trunk Assessment   Upper Extremity Assessment: Overall WFL for tasks assessed           Lower Extremity Assessment: LLE deficits/detail;RLE deficits/detail RLE Deficits / Details: Strength WFL with AROM at knee to ~90 flex LLE Deficits / Details: 2/5 quads with AAROM at knee -10 - 25  Cervical / Trunk Assessment:  (pt states he needs back surgery after other knee is done)  Communication   Communication: No difficulties  Cognition Arousal/Alertness: Awake/alert Behavior During Therapy: WFL for tasks assessed/performed Overall Cognitive Status: Within Functional Limits for tasks assessed                      General Comments      Exercises        Assessment/Plan    PT Assessment Patient  needs continued PT services  PT Diagnosis Difficulty walking   PT Problem List Decreased strength;Decreased activity tolerance;Decreased range of motion;Decreased mobility;Decreased knowledge of use of DME;Pain  PT Treatment Interventions Stair training;Gait training;DME instruction;Functional mobility training;Therapeutic activities;Therapeutic exercise;Patient/family education   PT Goals (Current goals can be found in the Care Plan section) Acute Rehab PT Goals Patient Stated Goal: Get healed so I can have other knee then back surgery PT Goal Formulation: With patient Time For Goal Achievement: 08/31/13 Potential to Achieve Goals: Good    Frequency 7X/week   Barriers to discharge Decreased  caregiver support Home with elderly father who has caregiver    Co-evaluation               End of Session Equipment Utilized During Treatment: Gait belt;Left knee immobilizer Activity Tolerance: Patient tolerated treatment well;Patient limited by pain Patient left: in chair;with call bell/phone within reach Nurse Communication: Mobility status         Time: 1200-1240 PT Time Calculation (min): 40 min   Charges:   PT Evaluation $Initial PT Evaluation Tier I: 1 Procedure PT Treatments $Gait Training: 8-22 mins $Therapeutic Exercise: 8-22 mins   PT G Codes:          Tauriel Scronce 08/24/2013, 3:28 PM

## 2013-08-24 NOTE — Discharge Instructions (Addendum)
Walk with your walker. Weight bearing as instructed. [facility] will follow you at home for your therapy  Change your dressing daily. Shower only, no tub bath. Call if any temperatures greater than 101 or any wound complications: [phone] during the day and ask for Dr. Charlestine Night nurse, Brunilda Payor.   Information on my medicine - XARELTO (Rivaroxaban)  This medication education was reviewed with me or my healthcare representative as part of my discharge preparation.  The pharmacist that spoke with me during my hospital stay was:  Hershal Coria, [facility]  Why was Xarelto prescribed for you? Xarelto was prescribed for you to reduce the risk of blood clots forming after orthopedic surgery. The medical term for these abnormal blood clots is venous thromboembolism (VTE).  What do you need to know about xarelto ? Take your Xarelto ONCE DAILY at the same time every day. You may take it either with or without food.  If you have difficulty swallowing the tablet whole, you may crush it and mix in applesauce just prior to taking your dose.  Take Xarelto exactly as prescribed by your doctor and DO NOT stop taking Xarelto without talking to the doctor who prescribed the medication.  Stopping without other VTE prevention medication to take the place of Xarelto may increase your risk of developing a clot.  After discharge, you should have regular check-up appointments with your healthcare provider that is prescribing your Xarelto.    What do you do if you miss a dose? If you miss a dose, take it as soon as you remember on the same day then continue your regularly scheduled once daily regimen the next day. Do not take two doses of Xarelto on the same day.   Important Safety Information A possible side effect of Xarelto is bleeding. You should call your healthcare provider right away if you experience any of the following:   Bleeding from an injury or your nose that does not stop.   Unusual colored urine (red or dark brown) or unusual colored stools (red or black).   Unusual bruising for unknown reasons.   A serious fall or if you hit your head (even if there is no bleeding).  Some medicines may interact with Xarelto and might increase your risk of bleeding while on Xarelto. To help avoid this, consult your healthcare provider or pharmacist prior to using any new prescription or non-prescription medications, including herbals, vitamins, non-steroidal anti-inflammatory drugs (NSAIDs) and supplements.  This website has more information on Xarelto: [email or web address]

## 2013-08-24 NOTE — Plan of Care (Signed)
Problem: Consults Goal: Diagnosis- Total Joint Replacement Outcome: Completed/Met Date Met:  08/24/13 Primary Total Knee LEFT  Problem: Phase I Progression Outcomes Goal: Dangle or out of bed evening of surgery Outcome: Not Met (add Reason) Pt refused

## 2013-08-24 NOTE — Progress Notes (Signed)
Physical Therapy Treatment Patient Details Name: Juan Wiggins MRN: [id number] DOB: 1950-06-30 Today's Date: 08/24/2013    History of Present Illness Pt is s/p L TKA    PT Comments    Pt very motivated but ltd by pain.  Follow Up Recommendations  Home health PT;SNF     Equipment Recommendations  None recommended by PT    Recommendations for Other Services OT consult     Precautions / Restrictions Precautions Precautions: Knee Required Braces or Orthoses: Knee Immobilizer - Left Knee Immobilizer - Left: Discontinue once straight leg raise with < 10 degree lag Restrictions Weight Bearing Restrictions: No Other Position/Activity Restrictions: WBAT    Mobility  Bed Mobility Overal bed mobility: Needs Assistance Bed Mobility: Supine to Sit     Supine to sit: Min assist     General bed mobility comments: cues for sequence and use of R LE to self assist  Transfers Overall transfer level: Needs assistance Equipment used: Rolling walker (2 wheeled) Transfers: Sit to/from Stand Sit to Stand: Min assist         General transfer comment: cue for LE management  Ambulation/Gait Ambulation/Gait assistance: Min assist Ambulation Distance (Feet): 38 Feet (and 15'x2 to/from bathroom) Assistive device: Rolling walker (2 wheeled) Gait Pattern/deviations: Step-to pattern;Decreased step length - right;Decreased step length - left;Shuffle;Trunk flexed Gait velocity: decr   General Gait Details: cues for sequence, posture and position from [name]            Wheelchair Mobility    Modified Rankin (Stroke Patients Only)       Balance                                    Cognition Arousal/Alertness: Awake/alert Behavior During Therapy: WFL for tasks assessed/performed Overall Cognitive Status: Within Functional Limits for tasks assessed                      Exercises Total Joint Exercises Ankle Circles/Pumps: AROM;Both;10  reps;Supine Quad Sets: AROM;Both;5 reps;Supine Heel Slides: AAROM;5 reps;Left;Supine Straight Leg Raises: AAROM;Left;5 reps;Supine    General Comments        Pertinent Vitals/Pain Premed, ice packs declined  7/10;     Home Living Family/patient expects to be discharged to:: Unsure Living Arrangements: Parent Available Help at Discharge: Other (Comment) Type of Home: House Home Access: Level entry   Home Layout: Able to live on main level with bedroom/bathroom Home Equipment: Gilford Rile - 2 wheels Additional Comments: Lives with elderly father who has a caregiver.  Pt has a tub with grab bar, upstairs standard commode has grab bar, and he can borrow reacher and sock aide.  He has a long brush    Prior Function Level of Independence: Independent          PT Goals (current goals can now be found in the care plan section) Acute Rehab PT Goals Patient Stated Goal: Get healed so I can have other knee then back surgery PT Goal Formulation: With patient Time For Goal Achievement: 08/31/13 Potential to Achieve Goals: Good Progress towards PT goals: Progressing toward goals    Frequency  7X/week    PT Plan Current plan remains appropriate    Co-evaluation             End of Session Equipment Utilized During Treatment: Gait belt;Left knee immobilizer Activity Tolerance: Patient tolerated treatment well;Patient limited by pain  Patient left: in chair;with call bell/phone within reach;with family/visitor present     Time: 1430-1516 PT Time Calculation (min): 46 min  Charges:  $Gait Training: 8-22 mins $Therapeutic Exercise: 8-22 mins $Therapeutic Activity: 8-22 mins                    G Codes:      Juan Wiggins 09-17-13, 3:33 PM

## 2013-08-25 LAB — BASIC METABOLIC PANEL
Anion gap: 10 (ref 5–15)
BUN: 12 mg/dL (ref 6–23)
CALCIUM: 8.4 mg/dL (ref 8.4–10.5)
CHLORIDE: 103 meq/L (ref 96–112)
CO2: 27 mEq/L (ref 19–32)
CREATININE: 1.05 mg/dL (ref 0.50–1.35)
GFR calc non Af Amer: 75 mL/min — ABNORMAL LOW (ref >90)
GFR, EST AFRICAN AMERICAN: 87 mL/min — AB (ref >90)
Glucose, Bld: 97 mg/dL (ref 70–99)
Potassium: 4.2 mEq/L (ref 3.7–5.3)
Sodium: 140 mEq/L (ref 137–147)

## 2013-08-25 LAB — CBC
HCT: 30.9 % — ABNORMAL LOW (ref 39.0–52.0)
Hemoglobin: 10.4 g/dL — ABNORMAL LOW (ref 13.0–17.0)
MCH: 32 pg (ref 26.0–34.0)
MCHC: 33.7 g/dL (ref 30.0–36.0)
MCV: 95.1 fL (ref 78.0–100.0)
Platelets: 321 10*3/uL (ref 150–400)
RBC: 3.25 MIL/uL — ABNORMAL LOW (ref 4.22–5.81)
RDW: 13 % (ref 11.5–15.5)
WBC: 8.6 10*3/uL (ref 4.0–10.5)

## 2013-08-25 MED ORDER — METHOCARBAMOL 500 MG PO TABS: 500.0000 mg | ORAL_TABLET | Freq: Four times a day (QID) | ORAL | Status: DC | PRN

## 2013-08-25 MED ORDER — OXYCODONE HCL 10 MG PO TABS: 10.0000 mg | ORAL_TABLET | ORAL | Status: DC | PRN

## 2013-08-25 MED ORDER — OXYCODONE HCL 5 MG PO TABS: 5.0000 mg | ORAL_TABLET | ORAL | Status: DC | PRN

## 2013-08-25 MED ORDER — OXYCODONE HCL 5 MG PO TABS
10.0000 mg | ORAL_TABLET | ORAL | Status: DC | PRN
  Administered 2013-08-25: 30 mg via ORAL
  Filled 2013-08-25: qty 6

## 2013-08-25 NOTE — Progress Notes (Signed)
Clinical Social Work Department CLINICAL SOCIAL WORK PLACEMENT NOTE 08/25/2013  Patient:  Juan Wiggins, Juan Wiggins  Account Number:  [id number] Admit date:  08/23/2013  Clinical Social Worker:  Werner Lean, LCSW  Date/time:  08/25/2013 09:14 AM  Clinical Social Work is seeking post-discharge placement for this patient at the following level of care:   SKILLED NURSING   (*CSW will update this form in Epic as items are completed)   08/25/2013  Patient/family provided with [facility] Department of Clinical Social Work's list of facilities offering this level of care within the geographic area requested by the patient (or if unable, by the patient's family).  08/25/2013  Patient/family informed of their freedom to choose among providers that offer the needed level of care, that participate in Medicare, Medicaid or managed care program needed by the patient, have an available bed and are willing to accept the patient.    Patient/family informed of [facility]' ownership interest in [facility], as well as of the fact that they are under no obligation to receive care at this facility.  PASARR submitted to EDS on 08/24/2013 PASARR number received on 08/24/2013  FL2 transmitted to all facilities in geographic area requested by pt/family on  08/25/2013 FL2 transmitted to all facilities within larger geographic area on   Patient informed that his/her managed care company has contracts with or will negotiate with  certain facilities, including the following:     Patient/family informed of bed offers received:  08/25/2013 Patient chooses bed at [place] Physician recommends and patient chooses bed at    Patient to be transferred to [place] on  08/25/2013 Patient to be transferred to facility by [facility] Patient and family notified of transfer on 08/25/2013 Name of family member notified:  SISTER  The following physician request were entered in Epic:   Additional Comments: Pt /  sister were in agreement with d/c to SNF via [facility] today. Nsg reviewed d/c summary, avs, scripts. Scripts were included in d/c packet.  Werner Lean LCSW [phone]

## 2013-08-25 NOTE — Progress Notes (Signed)
Clinical Social Work Department BRIEF PSYCHOSOCIAL ASSESSMENT 08/25/2013  Patient:  Juan Wiggins, Juan Wiggins     Account Number:  [id number]     Admit date:  08/23/2013  Clinical Social Worker:  Lacie Scotts  Date/Time:  08/25/2013 09:04 AM  Referred by:  RN  Date Referred:  08/24/2013 Referred for  SNF Placement   Other Referral:   Interview type:  Patient Other interview type:    PSYCHOSOCIAL DATA Living Status:  ALONE Admitted from facility:   Level of care:   Primary support name:  Felicity Pellegrini Primary support relationship to patient:  FRIEND Degree of support available:   unclear    CURRENT CONCERNS  Other Concerns:    SOCIAL WORK ASSESSMENT / PLAN Pt is a 18 old gentleman living at home prior to hospitalization. CSW met with pt to assist with d/c planning. ST Rehab will be needed following hospital d/c. Pt agrees with this plan. SNF search has been initiated and bed offers provided. Pt has accepted placement at [facility]. CSW will follow to assist with d/c planning to SNF.   Assessment/plan status:   Other assessment/ plan:   Information/referral to community resources:   Insurance coverage for SNF and ambulance transport reviewed.    PATIENT'S/FAMILY'S RESPONSE TO PLAN OF CARE: Pt was planning to d/c to his sister's home but feels a short time at rehab is needed. PT / OT support this plan. Pt is motivated to work with therapy. He is looking for to rehab at [facility].    Werner Lean LCSW [phone]

## 2013-08-25 NOTE — Progress Notes (Signed)
Physical Therapy Treatment Patient Details Name: Juan Wiggins MRN: [id number] DOB: [birth year] Today's Date: 08/25/2013    History of Present Illness Pt is s/p L TKA    PT Comments    POD # 2 am session.  Applied KI and instructed pt on it's use for amb.  Assisted OOB to amb limited distance in hallway.  Performed all supine TKR TE's following handout followed by ICE.  Pt instructed on proper positioning of L LE during rest.  Pt plans to D/C to [facility] for [facility] Re[facility].  Follow Up Recommendations  SNF ([place])     Equipment Recommendations  None recommended by PT    Recommendations for Other Services       Precautions / Restrictions Precautions Precautions: Knee Precaution Comments: Instructed on KI use for amb Required Braces or Orthoses: Knee Immobilizer - Left Restrictions Weight Bearing Restrictions: No Other Position/Activity Restrictions: WBAT    Mobility  Bed Mobility Overal bed mobility: Needs Assistance Bed Mobility: Supine to Sit     Supine to sit: Min assist     General bed mobility comments: cues for sequence and use of R LE to self assist  Transfers Overall transfer level: Needs assistance Equipment used: Rolling walker (2 wheeled) Transfers: Sit to/from Stand Sit to Stand: Min assist         General transfer comment: cue for LE management  Ambulation/Gait Ambulation/Gait assistance: Min assist Ambulation Distance (Feet): 58 Feet Assistive device: Rolling walker (2 wheeled) Gait Pattern/deviations: Step-to pattern Gait velocity: decr   General Gait Details: cues for sequence, posture and position from [name]            Wheelchair Mobility    Modified Rankin (Stroke Patients Only)       Balance                                    Cognition                            Exercises   Total Knee Replacement TE's 10 reps B LE ankle pumps 10 reps towel squeezes 10 reps knee presses 10 reps  heel slides  10 reps SAQ's 10 reps SLR's 10 reps ABD Followed by ICE     General Comments        Pertinent Vitals/Pain C/o 5/10 pain ICE    Home Living                      Prior Function            PT Goals (current goals can now be found in the care plan section) Progress towards PT goals: Progressing toward goals    Frequency  7X/week    PT Plan      Co-evaluation             End of Session Equipment Utilized During Treatment: Gait belt;Left knee immobilizer Activity Tolerance: Patient tolerated treatment well;Patient limited by pain Patient left: in chair;with call bell/phone within reach;with family/visitor present     Time: 5361-4431 PT Time Calculation (min): 40 min  Charges:  $Gait Training: 8-22 mins $Therapeutic Exercise: 8-22 mins $Therapeutic Activity: 8-22 mins                    G Codes:      Juan Wiggins  Juan Wiggins  PTA [facility]  Acute  Rehab Pager      [phone]

## 2013-08-25 NOTE — Progress Notes (Addendum)
   Subjective: 2 Days Post-Op Procedure(s) (LRB): LEFT TOTAL KNEE ARTHROPLASTY (Left)   Patient reports pain as mild, pain controlled. No events throughout the night. Ready to be discharged to SNF.  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   LABS  Recent Labs  08/24/13 0500 08/25/13 0455  HGB 11.5* 10.4*  HCT 34.2* 30.9*  WBC 13.7* 8.6  PLT 323 321     Recent Labs  08/24/13 0500 08/25/13 0455  NA 135* 140  K 5.0 4.2  BUN 13 12  CREATININE 1.18 1.05  GLUCOSE 129* 97     Assessment/Plan: 2 Days Post-Op Procedure(s) (LRB): LEFT TOTAL KNEE ARTHROPLASTY (Left) Up with therapy Discharge to SNF Follow up in 2 weeks [facility] Follow up with Dr. Gladstone Lighter in 2 weeks.  Contact information:  [facility] [address], [address] [place]   PAC  08/25/2013, 8:46 AM

## 2013-08-25 NOTE — Discharge Summary (Signed)
Physician Discharge Summary  Patient ID: ORRY SIGL MRN: [id number] DOB/AGE: Jul 09, 1950 63 y.o.  Admit date: 08/23/2013 Discharge date:  08/25/2013  Procedures:  Procedure(s) (LRB): LEFT TOTAL KNEE ARTHROPLASTY (Left)  Attending Physician:  Dr. Paralee Cancel   Admission Diagnoses:   Left knee pain  Discharge Diagnoses:  Active Problems:   Osteoarthritis of left knee   Total knee replacement status  Past Medical History  Diagnosis Date  . Hypertension   . Thyroid disease   . Hemorrhoids   . Hypothyroidism   . Tachycardia     PT STATES HIS HEART RATE USUALLY 100 OR MORE  . Depression   . Anxiety   . GERD (gastroesophageal reflux disease)     PREVACID IF NEEDED  . Arthritis     RHEUMATOID ARTHRITIS; OA LEFT KNEE  . Cancer     MELANOMA REMOVED RT SHOULDER  . 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 Sleep apnea     CLAUSTROPHOBIC - COULD NOT TOLERATE CPAP MASK    HPI: [name], 63 y.o. male, has a history of pain and functional disability in the left knee due to arthritis and has failed non-surgical conservative treatments for greater than 12 weeks to includeNSAID's and/or analgesics, corticosteriod injections, viscosupplementation injections and activity modification. Onset of symptoms was gradual, starting 5 years ago with gradually worsening course since that time. The patient noted prior procedures on the knee to include arthroscopy and menisectomy on the left knee(s). Patient currently rates pain in the left knee(s) at 6 out of 10 with activity. Patient has night pain, worsening of pain with activity and weight bearing, pain that interferes with activities of daily living, pain with passive range of motion, crepitus and joint swelling. Patient has evidence of periarticular osteophytes and joint space narrowing by imaging studies. There is no active  infection.  PCP: Orpah Melter, MD   Discharged Condition: good  Hospital Course:  Patient underwent the above stated procedure on 08/23/2013. Patient tolerated the procedure well and brought to the recovery room in good condition and subsequently to the floor.  POD #1 BP: 146/76 ; Pulse: 73 ; Temp: 98.6 F (37 C) ; Resp: 16 Hemovac Dcd. No post-op problems. Neurologically intact and dorsiflexion/plantar flexion intact.  LABS  Basename    HGB  11.5  HCT  34.2   POD #2  BP: 139/79 ; Pulse: 101 ; Temp: 98.3 F (36.8 C) ; Resp: 16  Patient reports pain as mild, pain controlled. No events throughout the night. Ready to be discharged to SNF. Dorsiflexion/plantar flexion intact, incision: dressing C/D/I, no cellulitis present and compartment soft.   LABS  Basename    HGB  10.4  HCT  30.9    Discharge Exam: General appearance: alert, cooperative and no distress Extremities: Homans sign is negative, no sign of DVT, no edema, redness or tenderness in the calves or thighs and no ulcers, gangrene or trophic changes  Disposition:    Skilled nursing facility with follow up in 2 weeks   Follow-up Information   Follow up with GIOFFRE,RONALD A, MD. Schedule an appointment as soon as possible for a visit in 2 weeks.   Specialty:  Orthopedic Surgery   Contact information:   [address] [place] [address] [phone]       Discharge  Instructions   Call MD / Call 911    Complete by:  As directed   If you experience chest pain or shortness of breath, CALL 911 and be transported to the hospital emergency room.  If you develope a fever above 101 F, pus (white drainage) or increased drainage or redness at the wound, or calf pain, call your surgeon's office.     Change dressing    Complete by:  As directed   Change dressing daily with sterile 4 x 4 inch gauze dressing     Constipation Prevention    Complete by:  As directed   Drink plenty of fluids.  Prune juice  may be helpful.  You may use a stool softener, such as Colace (over the counter) 100 mg twice a day.  Use MiraLax (over the counter) for constipation as needed.     Diet - low sodium heart healthy    Complete by:  As directed      Discharge instructions    Complete by:  As directed   Walk with your walker. Weight bearing as instructed. Luckey will follow you at home for your therapy  Change your dressing daily. Shower only, no tub bath. Call if any temperatures greater than 101 or any wound complications: [phone] during the day and ask for Dr. Charlestine Night nurse, Brunilda Payor.     Do not put a pillow under the knee. Place it under the heel.    Complete by:  As directed      Driving restrictions    Complete by:  As directed   No driving     Increase activity slowly as tolerated    Complete by:  As directed               Medication List    STOP taking these medications       ibuprofen 200 MG tablet  Commonly known as:  ADVIL,MOTRIN     multivitamin with minerals Tabs tablet     OVER THE COUNTER MEDICATION      TAKE these medications       ferrous sulfate 325 (65 FE) MG tablet  Take 1 tablet (325 mg total) by mouth 3 (three) times daily after meals.     levothyroxine 200 MCG tablet  Commonly known as:  SYNTHROID, LEVOTHROID  Take 200 mcg by mouth daily before breakfast.     lisinopril 10 MG tablet  Commonly known as:  PRINIVIL,ZESTRIL  Take 10 mg by mouth every morning.     methocarbamol 500 MG tablet  Commonly known as:  ROBAXIN  Take 1 tablet (500 mg total) by mouth every 6 (six) hours as needed for muscle spasms.     OLANZapine-FLUoxetine 12-50 MG per capsule  Commonly known as:  SYMBYAX  Take 1 capsule by mouth at bedtime.     Oxycodone HCl 10 MG Tabs  Take 1-3 tablets (10-30 mg total) by mouth every 3 (three) hours as needed for severe pain.     PREVACID PO  Take by mouth as needed.     rivaroxaban 10 MG Tabs tablet  Commonly known as:   XARELTO  Take 1 tablet (10 mg total) by mouth daily with breakfast.     zolpidem 10 MG tablet  Commonly known as:  AMBIEN  Take 10 mg by mouth at bedtime as needed for sleep.            Signed: [place] Pugh. Kandance Yano   PA-C  08/25/2013,  8:56 AM

## 2013-08-25 NOTE — Progress Notes (Signed)
Clinical Social Work Department CLINICAL SOCIAL WORK PLACEMENT NOTE 08/25/2013  Patient:  Juan Wiggins, Juan Wiggins  Account Number:  [id number] Admit date:  08/23/2013  Clinical Social Worker:  Werner Lean, LCSW  Date/time:  08/25/2013 09:14 AM  Clinical Social Work is seeking post-discharge placement for this patient at the following level of care:   SKILLED NURSING   (*CSW will update this form in Epic as items are completed)   08/25/2013  Patient/family provided with [facility] Department of Clinical Social Work's list of facilities offering this level of care within the geographic area requested by the patient (or if unable, by the patient's family).  08/25/2013  Patient/family informed of their freedom to choose among providers that offer the needed level of care, that participate in Medicare, Medicaid or managed care program needed by the patient, have an available bed and are willing to accept the patient.    Patient/family informed of [facility]' ownership interest in [facility], as well as of the fact that they are under no obligation to receive care at this facility.  PASARR submitted to EDS on 08/24/2013 PASARR number received on 08/24/2013  FL2 transmitted to all facilities in geographic area requested by pt/family on  08/25/2013 FL2 transmitted to all facilities within larger geographic area on   Patient informed that his/her managed care company has contracts with or will negotiate with  certain facilities, including the following:     Patient/family informed of bed offers received:  08/25/2013 Patient chooses bed at [facility] Physician recommends and patient chooses bed at    Patient to be transferred to [place] on   Patient to be transferred to facility by  Patient and family notified of transfer on  Name of family member notified:    The following physician request were entered in Epic:   Additional Comments:  Werner Lean LCSW [phone]

## 2013-08-28 ENCOUNTER — Non-Acute Institutional Stay (SKILLED_NURSING_FACILITY): Payer: 59 | Admitting: Adult Health

## 2013-08-28 ENCOUNTER — Encounter: Payer: Self-pay | Admitting: Adult Health

## 2013-08-28 DIAGNOSIS — F329 Major depressive disorder, single episode, unspecified: Secondary | ICD-10-CM

## 2013-08-28 DIAGNOSIS — F3289 Other specified depressive episodes: Secondary | ICD-10-CM

## 2013-08-28 DIAGNOSIS — M1710 Unilateral primary osteoarthritis, unspecified knee: Secondary | ICD-10-CM

## 2013-08-28 DIAGNOSIS — E039 Hypothyroidism, unspecified: Secondary | ICD-10-CM

## 2013-08-28 DIAGNOSIS — M1712 Unilateral primary osteoarthritis, left knee: Secondary | ICD-10-CM

## 2013-08-28 DIAGNOSIS — D62 Acute posthemorrhagic anemia: Secondary | ICD-10-CM

## 2013-08-28 DIAGNOSIS — I10 Essential (primary) hypertension: Secondary | ICD-10-CM

## 2013-08-28 DIAGNOSIS — K219 Gastro-esophageal reflux disease without esophagitis: Secondary | ICD-10-CM | POA: Insufficient documentation

## 2013-08-28 NOTE — Progress Notes (Signed)
Patient ID: Juan Wiggins, male   DOB: 09-Feb-1951, 63 y.o.   MRN: [id number]               PROGRESS NOTE  DATE: 08/28/2013  FACILITY: Nursing Home Location: [facility]  LEVEL OF CARE: SNF (31)  Acute Visit  CHIEF COMPLAINT:  Follow-up Hospitalization  HISTORY OF PRESENT ILLNESS: This is a 63 year old male who has been admitted to [facility] on 08/25/13 from [facility] with Osteoarthritis S/P Left total knee arthroplasty. He has been admitted for a short-term rehabilitation.  REASSESSMENT OF ONGOING PROBLEM(S):  HTN: Pt 's HTN remains stable.  Denies CP, sob, DOE, pedal edema, headaches, dizziness or visual disturbances.  No complications from the medications currently being used.  Last BP : 120/84  ANEMIA: The anemia has been stable. The patient denies fatigue, melena or hematochezia. No complications from the medications currently being used.  7/15 hgb 10.4  DEPRESSION: The depression remains stable. Patient denies ongoing feelings of sadness, insomnia, anedhonia or lack of appetite. No complications reported from the medications currently being used. Staff do not report behavioral problems.  PAST MEDICAL HISTORY : Reviewed.  No changes/see problem list  CURRENT MEDICATIONS: Reviewed per MAR/see medication list  REVIEW OF SYSTEMS:  GENERAL: no change in appetite, no fatigue, no weight changes, no fever, chills or weakness RESPIRATORY: no cough, SOB, DOE, wheezing, hemoptysis CARDIAC: no chest pain, edema or palpitations GI: no abdominal pain, diarrhea, constipation, heart burn, nausea or vomiting  PHYSICAL EXAMINATION  GENERAL: no acute distress, normal body habitus EYES: conjunctivae normal, sclerae normal, normal eye lids NECK: supple, trachea midline, no neck masses, no thyroid tenderness, no thyromegaly LYMPHATICS: no LAN in the neck, no supraclavicular LAN RESPIRATORY: breathing is even & unlabored, BS CTAB CARDIAC: RRR, no murmur,no extra  heart sounds, no edema GI: abdomen soft, normal BS, no masses, no tenderness, no hepatomegaly, no splenomegaly EXTREMITIES: able to move all 4 extremities; limited ROM on LLE due to surgery PSYCHIATRIC: the patient is alert & oriented to person, affect & behavior appropriate  LABS/RADIOLOGY: Labs reviewed: Basic Metabolic Panel:  Recent Labs  08/15/13 1345 08/24/13 0500 08/25/13 0455  NA 137 135* 140  K 4.4 5.0 4.2  CL 98 99 103  CO2 25 25 27   GLUCOSE 92 129* 97  BUN 11 13 12   CREATININE 1.19 1.18 1.05  CALCIUM 9.4 9.0 8.4   Liver Function Tests:  Recent Labs  07/06/13 1336 08/15/13 1345  AST 23 25  ALT 17 19  ALKPHOS 88 93  BILITOT 0.2* 0.2*  PROT 7.8 7.0  ALBUMIN 3.8 3.8   CBC:  Recent Labs  07/06/13 1336 08/15/13 1345 08/24/13 0500 08/25/13 0455  WBC 6.4 8.8 13.7* 8.6  NEUTROABS 4.7  --   --   --   HGB 14.6 15.1 11.5* 10.4*  HCT 41.5 44.6 34.2* 30.9*  MCV 95.6 96.3 94.7 95.1  PLT 372 365 323 321     ASSESSMENT/PLAN:  Osteoarthritis S/P Left total knee arthroplasty - for rehabilitation Hypertension - well-controlled; continue Prinivil Hypothyroidism - continue Synthroid Anemia, acute blood loss - continue FeSO4; check CBC GERD - stable Depression - continue Olanzapine -Fluoxetine   CPT CODE: 01751  Annais Crafts Vargas - NP [facility] [phone]

## 2013-08-29 ENCOUNTER — Other Ambulatory Visit: Payer: Self-pay | Admitting: *Deleted

## 2013-08-29 MED ORDER — OXYCODONE HCL 10 MG PO TABS: 10.0000 mg | ORAL_TABLET | ORAL | Status: DC | PRN

## 2013-08-30 ENCOUNTER — Non-Acute Institutional Stay (SKILLED_NURSING_FACILITY): Payer: 59 | Admitting: Internal Medicine

## 2013-08-30 DIAGNOSIS — L03119 Cellulitis of unspecified part of limb: Secondary | ICD-10-CM

## 2013-08-30 DIAGNOSIS — M1710 Unilateral primary osteoarthritis, unspecified knee: Secondary | ICD-10-CM

## 2013-08-30 DIAGNOSIS — M1712 Unilateral primary osteoarthritis, left knee: Secondary | ICD-10-CM

## 2013-08-30 DIAGNOSIS — D62 Acute posthemorrhagic anemia: Secondary | ICD-10-CM

## 2013-08-30 DIAGNOSIS — E039 Hypothyroidism, unspecified: Secondary | ICD-10-CM

## 2013-08-30 DIAGNOSIS — L02419 Cutaneous abscess of limb, unspecified: Secondary | ICD-10-CM

## 2013-08-31 ENCOUNTER — Encounter: Payer: Self-pay | Admitting: Adult Health

## 2013-08-31 ENCOUNTER — Non-Acute Institutional Stay (SKILLED_NURSING_FACILITY): Payer: 59 | Admitting: Adult Health

## 2013-08-31 ENCOUNTER — Encounter (HOSPITAL_COMMUNITY): Payer: Self-pay

## 2013-08-31 DIAGNOSIS — F3289 Other specified depressive episodes: Secondary | ICD-10-CM

## 2013-08-31 DIAGNOSIS — M1710 Unilateral primary osteoarthritis, unspecified knee: Secondary | ICD-10-CM

## 2013-08-31 DIAGNOSIS — L02419 Cutaneous abscess of limb, unspecified: Secondary | ICD-10-CM

## 2013-08-31 DIAGNOSIS — M1712 Unilateral primary osteoarthritis, left knee: Secondary | ICD-10-CM

## 2013-08-31 DIAGNOSIS — L03116 Cellulitis of left lower limb: Secondary | ICD-10-CM

## 2013-08-31 DIAGNOSIS — K219 Gastro-esophageal reflux disease without esophagitis: Secondary | ICD-10-CM

## 2013-08-31 DIAGNOSIS — D62 Acute posthemorrhagic anemia: Secondary | ICD-10-CM

## 2013-08-31 DIAGNOSIS — L03119 Cellulitis of unspecified part of limb: Secondary | ICD-10-CM

## 2013-08-31 DIAGNOSIS — I10 Essential (primary) hypertension: Secondary | ICD-10-CM

## 2013-08-31 DIAGNOSIS — L0390 Cellulitis, unspecified: Secondary | ICD-10-CM | POA: Insufficient documentation

## 2013-08-31 DIAGNOSIS — E039 Hypothyroidism, unspecified: Secondary | ICD-10-CM

## 2013-08-31 DIAGNOSIS — F329 Major depressive disorder, single episode, unspecified: Secondary | ICD-10-CM

## 2013-08-31 NOTE — Progress Notes (Signed)
Patient ID: Juan Wiggins, male   DOB: Jul 20, 1950, 63 y.o.   MRN: [id number]              PROGRESS NOTE  DATE:  08/31/13  FACILITY: Nursing Home Location: [facility]  LEVEL OF CARE: SNF (31)  Acute Visit  CHIEF COMPLAINT:  Discharge Notes  HISTORY OF PRESENT ILLNESS: This is a 63 year old male who is for discharge home with Home health PT, OT and Nursing. DME: rolling walker. He has been admitted to [facility] on 08/25/13 from [facility] with Osteoarthritis S/P Left total knee arthroplasty. Patient was admitted to this facility for short-term rehabilitation after the patient's recent hospitalization.  Patient has completed SNF rehabilitation and therapy has cleared the patient for discharge.   REASSESSMENT OF ONGOING PROBLEM(S):  HTN: Pt 's HTN remains stable.  Denies CP, sob, DOE, pedal edema, headaches, dizziness or visual disturbances.  No complications from the medications currently being used.  Last BP : 125/65  GERD: pt's GERD is stable.  Denies ongoing heartburn, abd. Pain, nausea or vomiting.  Currently on a PPI & tolerates it without any adverse reactions.  ANEMIA: The anemia has been stable. The patient denies fatigue, melena or hematochezia. No complications from the medications currently being used.  7/15 hgb 10.4   PAST MEDICAL HISTORY : Reviewed.  No changes/see problem list  CURRENT MEDICATIONS: Reviewed per MAR/see medication list  REVIEW OF SYSTEMS:  GENERAL: no change in appetite, no fatigue, no weight changes, no fever, chills or weakness RESPIRATORY: no cough, SOB, DOE, wheezing, hemoptysis CARDIAC: no chest pain, or palpitations, +edema GI: no abdominal pain, diarrhea, constipation, heart burn, nausea or vomiting  PHYSICAL EXAMINATION  GENERAL: no acute distress, normal body habitus SKIN: erythematous left knee area NECK: supple, trachea midline, no neck masses, no thyroid tenderness, no thyromegaly LYMPHATICS: no LAN in the  neck, no supraclavicular LAN RESPIRATORY: breathing is even & unlabored, BS CTAB CARDIAC: RRR, no murmur,no extra heart sounds, LLE edema 2+ GI: abdomen soft, normal BS, no masses, no tenderness, no hepatomegaly, no splenomegaly EXTREMITIES: able to move all 4 extremities; limited ROM on LLE due to surgery PSYCHIATRIC: the patient is alert & oriented to person, affect & behavior appropriate  LABS/RADIOLOGY: Labs reviewed: Basic Metabolic Panel:  Recent Labs  08/15/13 1345 08/24/13 0500 08/25/13 0455  NA 137 135* 140  K 4.4 5.0 4.2  CL 98 99 103  CO2 25 25 27   GLUCOSE 92 129* 97  BUN 11 13 12   CREATININE 1.19 1.18 1.05  CALCIUM 9.4 9.0 8.4   Liver Function Tests:  Recent Labs  07/06/13 1336 08/15/13 1345  AST 23 25  ALT 17 19  ALKPHOS 88 93  BILITOT 0.2* 0.2*  PROT 7.8 7.0  ALBUMIN 3.8 3.8   CBC:  Recent Labs  07/06/13 1336 08/15/13 1345 08/24/13 0500 08/25/13 0455  WBC 6.4 8.8 13.7* 8.6  NEUTROABS 4.7  --   --   --   HGB 14.6 15.1 11.5* 10.4*  HCT 41.5 44.6 34.2* 30.9*  MCV 95.6 96.3 94.7 95.1  PLT 372 365 323 321    EXAM: CHEST  2 VIEW   COMPARISON:  04/12/2011   FINDINGS: Normal heart size, mediastinal contours, and pulmonary vascularity.   Lungs mildly emphysematous with flattening of the diaphragms.   No pulmonary infiltrate, pleural effusion or pneumothorax.   No acute osseous findings.   IMPRESSION: COPD changes.   No acute abnormalities.   CLINICAL DATA:  Status post left total knee replacement.   EXAM: PORTABLE LEFT KNEE - 1-2 VIEW   COMPARISON:  No priors.   FINDINGS: Postoperative changes of recent total knee arthroplasty are noted. The femoral and tibial components of the prosthesis appear properly seated without periprosthetic fracture or other acute complicating features. Overlying soft tissues are swollen, and there are multiple locules of gas within the soft tissues. A surgical drain is present in the knee joint.     IMPRESSION: 1. Expected postoperative appearance of the left knee following total knee arthroplasty, without acute complicating features.     ASSESSMENT/PLAN:  Osteoarthritis S/P Left total knee arthroplasty - for rehabilitation Hypertension - well-controlled; continue Prinivil Hypothyroidism - continue Synthroid Anemia, acute blood loss - continue FeSO4; check CBC GERD - stable Depression - continue Olanzapine -Fluoxetine Cellulitis of left knee - continue Cephalexin 500 mg PO QID for a total of 10 days    I have filled out patient's discharge paperwork and written prescriptions.  Patient will receive home health PT, OT and Nursing.  DME provided: Rolling walker  Total discharge time: Greater than 30 minutes  Discharge time involved coordination of the discharge process with Education officer, museum, nursing staff and therapy department. Medical justification for home health services/DME verified.    CPT CODE: 94076  Seth Bake - NP [facility] [phone]

## 2013-09-01 DIAGNOSIS — L03119 Cellulitis of unspecified part of limb: Secondary | ICD-10-CM | POA: Insufficient documentation

## 2013-09-01 DIAGNOSIS — L02419 Cutaneous abscess of limb, unspecified: Secondary | ICD-10-CM | POA: Insufficient documentation

## 2013-09-01 NOTE — Progress Notes (Signed)
HISTORY & PHYSICAL  DATE: 08/30/2013   FACILITY: [facility]  LEVEL OF CARE: SNF (31)  ALLERGIES:  No Known Allergies  CHIEF COMPLAINT:  Manage left knee osteoarthritis, acute blood loss anemia and hypothyroidism  HISTORY OF PRESENT ILLNESS: Patient is a 63 year old Caucasian male.  KNEE OSTEOARTHRITIS: Patient had a history of pain and functional disability in the knee due to end-stage osteoarthritis and has failed nonsurgical conservative treatments. Patient had worsening of pain with activity and weight bearing, pain that interfered with activities of daily living & pain with passive range of motion. Therefore patient underwent total knee arthroplasty and tolerated the procedure well. Patient is admitted to this facility for sort short-term rehabilitation. Patient denies knee pain. Patient is complaining of increased left lower extremity swelling, redness and tenderness noted since yesterday.  ANEMIA: The anemia has been stable. The patient denies fatigue, melena or hematochezia. No complications from the medications currently being used. Postoperatively patient suffered acute blood anemia. He did not require transfusion.  HYPOTHYROIDISM: The hypothyroidism remains stable. No complications noted from the medications presently being used.  The patient denies fatigue or constipation.  Last TSH not available.  PAST MEDICAL HISTORY :  Past Medical History  Diagnosis Date  . Hypertension   . Thyroid disease   . Hemorrhoids   . Hypothyroidism   . Tachycardia     PT STATES HIS HEART RATE USUALLY 100 OR MORE  . Depression   . Anxiety   . GERD (gastroesophageal reflux disease)     PREVACID IF NEEDED  . Arthritis     RHEUMATOID ARTHRITIS; OA LEFT KNEE  . Cancer     MELANOMA REMOVED RT SHOULDER  . 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 Sleep  apnea     CLAUSTROPHOBIC - COULD NOT TOLERATE CPAP MASK    PAST SURGICAL HISTORY: Past Surgical History  Procedure Laterality Date  . Knee surgery      BILATERAL KNEE ARTHROSCOPY  . Shoulder surgery      RIGHT ROTATOR CUFF REPAIR AND LEFT ARTHROSCOPY  . Elbow surgery      BILATERAL ELBOW SURGERY  . Refractive surgery    . Total knee arthroplasty Left 08/23/2013    Procedure: LEFT TOTAL KNEE ARTHROPLASTY;  Surgeon: Tobi Bastos, MD;  Location: [facility];  Service: Orthopedics;  Laterality: Left;    SOCIAL HISTORY:  reports that he has been smoking Cigars.  He has never used smokeless tobacco. He reports that he drinks alcohol. He reports that he does not use illicit drugs.  FAMILY HISTORY:  Family History  Problem Relation Age of Onset  . Hypertension Mother   . Other Mother     varicose veins  . Hypertension Sister     CURRENT MEDICATIONS: Reviewed per MAR/see medication list  REVIEW OF SYSTEMS:  See HPI otherwise 14 point ROS is negative.  PHYSICAL EXAMINATION  VS:  See VS section  GENERAL: no acute distress, normal body habitus SKIN: Left lower extremity is erythematous, warm, edematous and exquisitely tender to palpation, no drainage from surgical incision EYES: conjunctivae normal, sclerae normal, normal eye lids MOUTH/THROAT: lips without lesions,no lesions in the mouth,tongue is without lesions,uvula elevates in midline NECK: supple, trachea midline, no neck masses, no thyroid tenderness, no thyromegaly LYMPHATICS: no LAN  in the neck, no supraclavicular LAN RESPIRATORY: breathing is even & unlabored, BS CTAB CARDIAC: RRR, no murmur,no extra heart sounds, left lower extremity +3 edema GI:  ABDOMEN: abdomen soft, normal BS, no masses, no tenderness  LIVER/SPLEEN: no hepatomegaly, no splenomegaly MUSCULOSKELETAL: HEAD: normal to inspection  EXTREMITIES: LEFT UPPER EXTREMITY: full range of motion, normal strength & tone RIGHT UPPER EXTREMITY:  full range of  motion, normal strength & tone LEFT LOWER EXTREMITY:  range of motion not tested due to surgery, normal strength & tone RIGHT LOWER EXTREMITY:  full range of motion, normal strength & tone PSYCHIATRIC: the patient is alert & oriented to person, affect & behavior appropriate  LABS/RADIOLOGY:  7 - 7-15 hemoglobin 11.5, MCV 100, platelets 452, WBC 9.1, CO2 32 otherwise BMP normal  Labs reviewed: Basic Metabolic Panel:  Recent Labs  08/15/13 1345 08/24/13 0500 08/25/13 0455  NA 137 135* 140  K 4.4 5.0 4.2  CL 98 99 103  CO2 25 25 27   GLUCOSE 92 129* 97  BUN 11 13 12   CREATININE 1.19 1.18 1.05  CALCIUM 9.4 9.0 8.4   Liver Function Tests:  Recent Labs  07/06/13 1336 08/15/13 1345  AST 23 25  ALT 17 19  ALKPHOS 88 93  BILITOT 0.2* 0.2*  PROT 7.8 7.0  ALBUMIN 3.8 3.8   CBC:  Recent Labs  07/06/13 1336 08/15/13 1345 08/24/13 0500 08/25/13 0455  WBC 6.4 8.8 13.7* 8.6  NEUTROABS 4.7  --   --   --   HGB 14.6 15.1 11.5* 10.4*  HCT 41.5 44.6 34.2* 30.9*  MCV 95.6 96.3 94.7 95.1  PLT 372 365 323 321    CHEST  2 VIEW   COMPARISON:  04/12/2011   FINDINGS: Normal heart size, mediastinal contours, and pulmonary vascularity.   Lungs mildly emphysematous with flattening of the diaphragms.   No pulmonary infiltrate, pleural effusion or pneumothorax.   No acute osseous findings.   IMPRESSION: COPD changes.   No acute abnormalities.   PORTABLE LEFT KNEE - 1-2 VIEW   COMPARISON:  No priors.   FINDINGS: Postoperative changes of recent total knee arthroplasty are noted. The femoral and tibial components of the prosthesis appear properly seated without periprosthetic fracture or other acute complicating features. Overlying soft tissues are swollen, and there are multiple locules of gas within the soft tissues. A surgical drain is present in the knee joint.   IMPRESSION: 1. Expected postoperative appearance of the left knee following total knee arthroplasty,  without acute complicating features.    ASSESSMENT/PLAN:  Left Knee osteoarthritis-status post total knee arthroplasty. Continue rehabilitation.  Acute blood loss anemia-hemoglobin stable. Hypothyroidism-continue levothyroxine. Left lower extremity cellulitis-we will onset significant problem. Start cephalexin 500 mg 4 times a day x10 days and probiotics twice a day x14 days. Notify orthopedic surgeon. Left lower extremity increasing edema-we'll obtain venous Doppler ultrasound to rule out DVT Macrocytosis-new problem. Check RBC folate and vitamin B12 level. Hypertension-blood pressure borderline. We'll monitor. Thrombocytosis-acute phase reactant. We'll monitor.  I have reviewed patient's medical records received at admission/from hospitalization.  CPT CODE: 98119  Cornelious Bartolucci Y Ravonda Brecheen, [place] [phone]

## 2013-09-29 ENCOUNTER — Other Ambulatory Visit: Payer: Self-pay | Admitting: Adult Health

## 2013-10-10 ENCOUNTER — Other Ambulatory Visit: Payer: Self-pay | Admitting: Adult Health

## 2013-10-20 ENCOUNTER — Emergency Department (HOSPITAL_BASED_OUTPATIENT_CLINIC_OR_DEPARTMENT_OTHER)
Admission: EM | Admit: 2013-10-20 | Discharge: 2013-10-21 | Disposition: A | Discharge status: Home / Self Care | Payer: 59 | Attending: Emergency Medicine | Admitting: Emergency Medicine

## 2013-10-20 ENCOUNTER — Emergency Department (HOSPITAL_BASED_OUTPATIENT_CLINIC_OR_DEPARTMENT_OTHER): Payer: 59

## 2013-10-20 DIAGNOSIS — S0993XA Unspecified injury of face, initial encounter: Secondary | ICD-10-CM | POA: Diagnosis not present

## 2013-10-20 DIAGNOSIS — Z8619 Personal history of other infectious and parasitic diseases: Secondary | ICD-10-CM | POA: Insufficient documentation

## 2013-10-20 DIAGNOSIS — Y9289 Other specified places as the place of occurrence of the external cause: Secondary | ICD-10-CM | POA: Diagnosis not present

## 2013-10-20 DIAGNOSIS — F329 Major depressive disorder, single episode, unspecified: Secondary | ICD-10-CM | POA: Insufficient documentation

## 2013-10-20 DIAGNOSIS — Z85828 Personal history of other malignant neoplasm of skin: Secondary | ICD-10-CM | POA: Diagnosis not present

## 2013-10-20 DIAGNOSIS — Y9389 Activity, other specified: Secondary | ICD-10-CM | POA: Diagnosis not present

## 2013-10-20 DIAGNOSIS — W1809XA Striking against other object with subsequent fall, initial encounter: Secondary | ICD-10-CM | POA: Insufficient documentation

## 2013-10-20 DIAGNOSIS — E039 Hypothyroidism, unspecified: Secondary | ICD-10-CM | POA: Diagnosis not present

## 2013-10-20 DIAGNOSIS — Z79899 Other long term (current) drug therapy: Secondary | ICD-10-CM | POA: Insufficient documentation

## 2013-10-20 DIAGNOSIS — M129 Arthropathy, unspecified: Secondary | ICD-10-CM | POA: Diagnosis not present

## 2013-10-20 DIAGNOSIS — S199XXA Unspecified injury of neck, initial encounter: Secondary | ICD-10-CM | POA: Diagnosis not present

## 2013-10-20 DIAGNOSIS — F3289 Other specified depressive episodes: Secondary | ICD-10-CM | POA: Diagnosis not present

## 2013-10-20 DIAGNOSIS — K219 Gastro-esophageal reflux disease without esophagitis: Secondary | ICD-10-CM | POA: Insufficient documentation

## 2013-10-20 DIAGNOSIS — F411 Generalized anxiety disorder: Secondary | ICD-10-CM | POA: Diagnosis not present

## 2013-10-20 DIAGNOSIS — S0990XA Unspecified injury of head, initial encounter: Secondary | ICD-10-CM | POA: Diagnosis not present

## 2013-10-20 DIAGNOSIS — Z7901 Long term (current) use of anticoagulants: Secondary | ICD-10-CM | POA: Insufficient documentation

## 2013-10-20 DIAGNOSIS — I10 Essential (primary) hypertension: Secondary | ICD-10-CM | POA: Diagnosis not present

## 2013-10-20 DIAGNOSIS — F17200 Nicotine dependence, unspecified, uncomplicated: Secondary | ICD-10-CM | POA: Insufficient documentation

## 2013-10-20 DIAGNOSIS — W19XXXA Unspecified fall, initial encounter: Secondary | ICD-10-CM
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
## 2013-10-21 LAB — CBC WITH DIFFERENTIAL/PLATELET
BASOS PCT: 1 % (ref 0–1)
Basophils Absolute: 0.1 10*3/uL (ref 0.0–0.1)
EOS ABS: 0.3 10*3/uL (ref 0.0–0.7)
Eosinophils Relative: 4 % (ref 0–5)
HCT: 40.2 % (ref 39.0–52.0)
HEMOGLOBIN: 13.4 g/dL (ref 13.0–17.0)
Lymphocytes Relative: 15 % (ref 12–46)
Lymphs Abs: 1.3 10*3/uL (ref 0.7–4.0)
MCH: 31.5 pg (ref 26.0–34.0)
MCHC: 33.3 g/dL (ref 30.0–36.0)
MCV: 94.6 fL (ref 78.0–100.0)
MONOS PCT: 10 % (ref 3–12)
Monocytes Absolute: 0.9 10*3/uL (ref 0.1–1.0)
NEUTROS ABS: 6.1 10*3/uL (ref 1.7–7.7)
Neutrophils Relative %: 70 % (ref 43–77)
PLATELETS: 373 10*3/uL (ref 150–400)
RBC: 4.25 MIL/uL (ref 4.22–5.81)
RDW: 14.4 % (ref 11.5–15.5)
WBC: 8.6 10*3/uL (ref 4.0–10.5)

## 2013-10-21 LAB — COMPREHENSIVE METABOLIC PANEL
ALBUMIN: 3.9 g/dL (ref 3.5–5.2)
ALK PHOS: 135 U/L — AB (ref 39–117)
ALT: 215 U/L — AB (ref 0–53)
AST: 177 U/L — AB (ref 0–37)
Anion gap: 14 (ref 5–15)
BILIRUBIN TOTAL: 0.5 mg/dL (ref 0.3–1.2)
BUN: 11 mg/dL (ref 6–23)
CHLORIDE: 101 meq/L (ref 96–112)
CO2: 27 mEq/L (ref 19–32)
Calcium: 10.2 mg/dL (ref 8.4–10.5)
Creatinine, Ser: 1.4 mg/dL — ABNORMAL HIGH (ref 0.50–1.35)
GFR calc Af Amer: 61 mL/min — ABNORMAL LOW (ref >90)
GFR calc non Af Amer: 53 mL/min — ABNORMAL LOW (ref >90)
Glucose, Bld: 100 mg/dL — ABNORMAL HIGH (ref 70–99)
Potassium: 4.1 mEq/L (ref 3.7–5.3)
SODIUM: 142 meq/L (ref 137–147)
TOTAL PROTEIN: 7.8 g/dL (ref 6.0–8.3)

## 2013-10-21 LAB — ETHANOL: Alcohol, Ethyl (B): <11 mg/dL (ref 0–11)

## 2013-10-21 LAB — TROPONIN I: Troponin I: <0.3 ng/mL (ref <0.30)

## 2013-10-21 NOTE — Discharge Instructions (Signed)

## 2013-10-21 NOTE — ED Provider Notes (Signed)
CSN: [id number]     Arrival date & time 10/20/13  2231 History   First MD Initiated Contact with Patient 10/21/13 0120     Chief Complaint  Patient presents with  . Fall     (Consider location/radiation/quality/duration/timing/severity/associated sxs/prior Treatment) HPI This is a 63 year old male who yesterday evening. He states he was taking out the garbage and slipped in the parking lot. He states he remembers the event but his details do not match that when she told nursing staff and EMS earlier (see nursing notes). Per nursing notes he fell and hit his head although he denies head injury to me. He is denying any significant injury at this time and is not having any pain other than some pain in his left knee status post TKA 6 weeks ago. He admits to drinking alcohol earlier but denies drug use other than prescription narcotics.  Past Medical History  Diagnosis Date  . Hypertension   . Thyroid disease   . Hemorrhoids   . Hypothyroidism   . Tachycardia     PT STATES HIS HEART RATE USUALLY 100 OR MORE  . Depression   . Anxiety   . GERD (gastroesophageal reflux disease)     PREVACID IF NEEDED  . Arthritis     RHEUMATOID ARTHRITIS; OA LEFT KNEE  . Cancer     MELANOMA REMOVED RT SHOULDER  . 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 Sleep apnea     CLAUSTROPHOBIC - COULD NOT TOLERATE CPAP MASK   Past Surgical History  Procedure Laterality Date  . Knee surgery      BILATERAL KNEE ARTHROSCOPY  . Shoulder surgery      RIGHT ROTATOR CUFF REPAIR AND LEFT ARTHROSCOPY  . Elbow surgery      BILATERAL ELBOW SURGERY  . Refractive surgery    . Total knee arthroplasty Left 08/23/2013    Procedure: LEFT TOTAL KNEE ARTHROPLASTY;  Surgeon: Tobi Bastos, MD;  Location: [facility];  Service: Orthopedics;  Laterality: Left;   Family History  Problem Relation Age of Onset  .  Hypertension Mother   . Other Mother     varicose veins  . Hypertension Sister    History  Substance Use Topics  . Smoking status: Current Every Day Smoker -- 10 years    Types: Cigars  . Smokeless tobacco: Never Used     Comment: used cigarettes 20 years ago  . Alcohol Use: Yes     Comment: 1/2 gal of scotch per day .   PT STATES NOW DRINKING 3 OR 4 GLASSES SCOTCH- HAS BEEN IN REHAB FOR ALCOHOLISM FEB 2015  FORMER CIGARETTE SMOKER - [place]     Review of Systems  All other systems reviewed and are negative.   Allergies  Review of patient's allergies indicates no known allergies.  Home Medications   Prior to Admission medications   Medication Sig Start Date End Date Taking? Authorizing Provider  ferrous sulfate 325 (65 FE) MG tablet Take 1 tablet (325 mg total) by mouth 3 (three) times daily after meals. 08/24/13   Amber Renelda Loma, PA-C  Lansoprazole (PREVACID PO) Take by mouth as needed.    Historical Provider, MD  levothyroxine (SYNTHROID, LEVOTHROID) 200 MCG tablet Take 200 mcg by mouth daily before breakfast.    Historical Provider, MD  lisinopril (PRINIVIL,ZESTRIL)  10 MG tablet Take 10 mg by mouth every morning.    Historical Provider, MD  methocarbamol (ROBAXIN) 500 MG tablet Take 1 tablet (500 mg total) by mouth every 6 (six) hours as needed for muscle spasms. 08/25/13   Lucille Passy Babish, PA-C  OLANZapine-FLUoxetine (SYMBYAX) 12-50 MG per capsule Take 1 capsule by mouth at bedtime.    Historical Provider, MD  oxycodone (ROXICODONE) 30 MG immediate release tablet Take 30 mg by mouth every 3 (three) hours as needed for pain.    Historical Provider, MD  Oxycodone HCl 10 MG TABS Take 1-3 tablets (10-30 mg total) by mouth every 3 (three) hours as needed. 08/29/13   Blanchie Serve, MD  Probiotic Product (SOLUBLE FIBER/PROBIOTICS PO) Take by mouth 2 (two) times daily.    Historical Provider, MD  rivaroxaban (XARELTO) 10 MG TABS tablet Take 1 tablet (10 mg total) by mouth  daily with breakfast. 08/24/13   Amber Renelda Loma, PA-C  zolpidem (AMBIEN) 10 MG tablet Take 10 mg by mouth at bedtime as needed for sleep.    Historical Provider, MD   BP 161/85  Pulse 107  Temp(Src) 98.3 F (36.8 C)  Resp 18  SpO2 95%  Physical Exam General: Well-developed, well-nourished male in no acute distress; appearance consistent with age of record HENT: normocephalic; atraumatic Eyes: pupils equal, round and reactive to light; extraocular muscles intact Neck: supple; non-tender Heart: regular rate and rhythm Lungs: clear to auscultation bilaterally Abdomen: soft; nondistended; nontender; no masses or hepatosplenomegaly; bowel sounds present Extremities: No deformity; well-healing surgical incision of the left knee; pulses normal; no edema Neurologic: Awake, alert and oriented; motor function intact in all extremities and symmetric; no facial droop Skin: Warm and dry Psychiatric: Normal mood and affect    ED Course  Procedures (including critical care time)   EKG Interpretation   Date/Time:  Friday October 20 2013 22:48:31 EDT Ventricular Rate:  108 PR Interval:  146 QRS Duration: 74 QT Interval:  328 QTC Calculation: 439 R Axis:   69 Text Interpretation:  Sinus tachycardia Cannot rule out Anterior infarct ,  age undetermined Abnormal ECG No significant change was found Confirmed by  Florina Ou  MD, Jenny Reichmann ([id number]) on 10/20/2013 11:01:42 PM      MDM   Nursing notes and vitals signs, including pulse oximetry, reviewed.  Summary of this visit's results, reviewed by myself:  Labs:  Results for orders placed during the hospital encounter of 10/20/13 (from the past 24 hour(s))  CBC WITH DIFFERENTIAL     Status: None   Collection Time    10/21/13 12:30 AM      Result Value Ref Range   WBC 8.6  4.0 - 10.5 K/uL   RBC 4.25  4.22 - 5.81 MIL/uL   Hemoglobin 13.4  13.0 - 17.0 g/dL   HCT 40.2  39.0 - 52.0 %   MCV 94.6  78.0 - 100.0 fL   MCH 31.5  26.0 - 34.0 pg    MCHC 33.3  30.0 - 36.0 g/dL   RDW 14.4  11.5 - 15.5 %   Platelets 373  150 - 400 K/uL   Neutrophils Relative % 70  43 - 77 %   Neutro Abs 6.1  1.7 - 7.7 K/uL   Lymphocytes Relative 15  12 - 46 %   Lymphs Abs 1.3  0.7 - 4.0 K/uL   Monocytes Relative 10  3 - 12 %   Monocytes Absolute 0.9  0.1 - 1.0 K/uL   Eosinophils  Relative 4  0 - 5 %   Eosinophils Absolute 0.3  0.0 - 0.7 K/uL   Basophils Relative 1  0 - 1 %   Basophils Absolute 0.1  0.0 - 0.1 K/uL  COMPREHENSIVE METABOLIC PANEL     Status: Abnormal   Collection Time    10/21/13 12:30 AM      Result Value Ref Range   Sodium 142  137 - 147 mEq/L   Potassium 4.1  3.7 - 5.3 mEq/L   Chloride 101  96 - 112 mEq/L   CO2 27  19 - 32 mEq/L   Glucose, Bld 100 (*) 70 - 99 mg/dL   BUN 11  6 - 23 mg/dL   Creatinine, Ser 1.40 (*) 0.50 - 1.35 mg/dL   Calcium 10.2  8.4 - 10.5 mg/dL   Total Protein 7.8  6.0 - 8.3 g/dL   Albumin 3.9  3.5 - 5.2 g/dL   AST 177 (*) 0 - 37 U/L   ALT 215 (*) 0 - 53 U/L   Alkaline Phosphatase 135 (*) 39 - 117 U/L   Total Bilirubin 0.5  0.3 - 1.2 mg/dL   GFR calc non Af Amer 53 (*) >90 mL/min   GFR calc Af Amer 61 (*) >90 mL/min   Anion gap 14  5 - 15  TROPONIN I     Status: None   Collection Time    10/21/13 12:30 AM      Result Value Ref Range   Troponin I <0.30  <0.30 ng/mL  ETHANOL     Status: None   Collection Time    10/21/13 12:30 AM      Result Value Ref Range   Alcohol, Ethyl (B) <11  0 - 11 mg/dL    Imaging Studies: Ct Head Wo Contrast  10/20/2013   CLINICAL DATA:  Fall.  Struck  EXAM: CT HEAD WITHOUT CONTRAST  CT CERVICAL SPINE WITHOUT CONTRAST  TECHNIQUE: Multidetector CT imaging of the head and cervical spine was performed following the standard protocol without intravenous contrast. Multiplanar CT image reconstructions of the cervical spine were also generated.  COMPARISON:  MRI cervical spine 02/12/2011  FINDINGS: CT HEAD FINDINGS  No acute infarct, hemorrhage, or mass lesion is present. Mild  atrophy is within normal limits for age. The ventricles are of normal size. No significant extraaxial fluid collection is present.  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 The paranasal sinuses and mastoid air cells are otherwise clear. The osseous skull is intact. No significant extracranial soft tissue injury is evident.  CT CERVICAL SPINE FINDINGS  Anterolisthesis at C2-3 and C3-4 is degenerative. There is chronic loss of disc height at C5-6 and C6-7. No acute fracture or traumatic subluxation is evident. The soft tissues of the neck are unremarkable.  IMPRESSION: 1. Normal CT appearance the brain for age. 2. Chronic bilateral maxillary sinus disease. 3. No significant acute extracranial trauma. 4. Moderate degenerative changes of the cervical spine without evidence for acute fracture or traumatic subluxation.   Electronically Signed   By: Lawrence Santiago M.D.   On: 10/20/2013 23:51   Ct Cervical Spine Wo Contrast  10/20/2013   CLINICAL DATA:  Fall.  Struck  EXAM: CT HEAD WITHOUT CONTRAST  CT CERVICAL SPINE WITHOUT CONTRAST  TECHNIQUE: Multidetector CT imaging of the head and cervical spine was performed following the standard protocol without intravenous contrast. Multiplanar CT image reconstructions of the cervical spine were also generated.  COMPARISON:  MRI cervical spine 02/12/2011  FINDINGS: CT HEAD FINDINGS  No acute infarct, hemorrhage, or mass lesion is present. Mild atrophy is within normal limits for age. The ventricles are of normal size. No significant extraaxial fluid collection is present.  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 The paranasal sinuses and mastoid air cells are otherwise clear. The osseous skull is intact. No significant extracranial soft tissue injury is evident.  CT CERVICAL SPINE FINDINGS  Anterolisthesis at C2-3 and C3-4 is degenerative. There is chronic loss of disc height at C5-6 and C6-7. No acute fracture or traumatic subluxation is evident.  The soft tissues of the neck are unremarkable.  IMPRESSION: 1. Normal CT appearance the brain for age. 2. Chronic bilateral maxillary sinus disease. 3. No significant acute extracranial trauma. 4. Moderate degenerative changes of the cervical spine without evidence for acute fracture or traumatic subluxation.   Electronically Signed   By: Lawrence Santiago M.D.   On: 10/20/2013 23:51      Wynetta Fines, MD 10/21/13 0130

## 2014-01-03 ENCOUNTER — Other Ambulatory Visit: Payer: Self-pay | Admitting: Surgical

## 2014-01-29 ENCOUNTER — Encounter (HOSPITAL_COMMUNITY): Payer: Self-pay

## 2014-01-29 ENCOUNTER — Encounter (HOSPITAL_COMMUNITY)
Admission: RE | Admit: 2014-01-29 | Discharge: 2014-01-29 | Disposition: A | Discharge status: Home / Self Care | Payer: 59 | Source: Ambulatory Visit | Attending: Orthopedic Surgery | Admitting: Orthopedic Surgery

## 2014-01-29 DIAGNOSIS — Z01818 Encounter for other preprocedural examination: Secondary | ICD-10-CM

## 2014-01-29 DIAGNOSIS — Z01812 Encounter for preprocedural laboratory examination: Secondary | ICD-10-CM | POA: Insufficient documentation

## 2014-01-29 HISTORY — DX: Cardiac murmur, unspecified: R01.1

## 2014-01-29 LAB — CBC
HEMATOCRIT: 38.3 % — AB (ref 39.0–52.0)
Hemoglobin: 13.1 g/dL (ref 13.0–17.0)
MCH: 33.7 pg (ref 26.0–34.0)
MCHC: 34.2 g/dL (ref 30.0–36.0)
MCV: 98.5 fL (ref 78.0–100.0)
Platelets: 234 10*3/uL (ref 150–400)
RBC: 3.89 MIL/uL — ABNORMAL LOW (ref 4.22–5.81)
RDW: 13.1 % (ref 11.5–15.5)
WBC: 4.8 10*3/uL (ref 4.0–10.5)

## 2014-01-29 LAB — COMPREHENSIVE METABOLIC PANEL
ALT: 79 U/L — ABNORMAL HIGH (ref 0–53)
AST: 77 U/L — ABNORMAL HIGH (ref 0–37)
Albumin: 3.3 g/dL — ABNORMAL LOW (ref 3.5–5.2)
Alkaline Phosphatase: 115 U/L (ref 39–117)
Anion gap: 10 (ref 5–15)
BUN: 14 mg/dL (ref 6–23)
CO2: 26 mEq/L (ref 19–32)
Calcium: 9 mg/dL (ref 8.4–10.5)
Chloride: 101 mEq/L (ref 96–112)
Creatinine, Ser: 1.2 mg/dL (ref 0.50–1.35)
GFR calc Af Amer: 74 mL/min — ABNORMAL LOW (ref >90)
GFR calc non Af Amer: 64 mL/min — ABNORMAL LOW (ref >90)
Glucose, Bld: 91 mg/dL (ref 70–99)
Potassium: 4.6 mEq/L (ref 3.7–5.3)
Sodium: 137 mEq/L (ref 137–147)
Total Bilirubin: 0.7 mg/dL (ref 0.3–1.2)
Total Protein: 7 g/dL (ref 6.0–8.3)

## 2014-01-29 LAB — URINALYSIS, ROUTINE W REFLEX MICROSCOPIC
Bilirubin Urine: NEGATIVE
Glucose, UA: NEGATIVE mg/dL
Hgb urine dipstick: NEGATIVE
Ketones, ur: NEGATIVE mg/dL
Leukocytes, UA: NEGATIVE
Nitrite: NEGATIVE
Protein, ur: NEGATIVE mg/dL
Specific Gravity, Urine: 1.003 — ABNORMAL LOW (ref 1.005–1.030)
Urobilinogen, UA: 0.2 mg/dL (ref 0.0–1.0)
pH: 6 (ref 5.0–8.0)

## 2014-01-29 LAB — SURGICAL PCR SCREEN
MRSA, PCR: NEGATIVE
STAPHYLOCOCCUS AUREUS: POSITIVE — AB

## 2014-01-29 LAB — PROTIME-INR
INR: 1.08 (ref 0.00–1.49)
Prothrombin Time: 14.1 seconds (ref 11.6–15.2)

## 2014-01-29 LAB — APTT: aPTT: 32 seconds (ref 24–37)

## 2014-01-29 NOTE — Progress Notes (Addendum)
CXR epic 08/15/2013  EKG epic 10/20/2013 CBC results per PAT visit 01/29/2014 sent to Dr Gladstone Lighter

## 2014-01-29 NOTE — Patient Instructions (Addendum)
20 Juan Wiggins  01/29/2014   Your procedure is scheduled on:      Wednesday February 07, 2014  Report to [facility] Main Entrance and follow signs to  [facility] arrive at 9:00 AM.   Call this number if you have problems the morning of surgery [phone] or Presurgical Testing [phone].   Remember:  Do not eat food or drink liquids :After Midnight.  For Living Will and/or Health Care Power Attorney Forms: please provide copy for your medical record, may bring AM of surgery (forms should be already notarized-we do not provide this service).      Take these medicines the morning of surgery with A SIP OF WATER: Xanax if needed;Oxycodone if needed;Levothyroxine;Prevacid;Lexapro                               You may not have any metal on your body including hair pins and piercings  Do not wear jewelry, lotions, powders, or deodorant.  Men may shave face and neck.               Do not bring valuables to the hospital. [place].  Contacts, dentures or bridgework may not be worn into surgery.  Leave suitcase in the car. After surgery it may be brought to your room.  For patients admitted to the hospital, checkout time is 11:00 AM the day of discharge.     Special Instructions: review fact sheets for MRSA information, Blood Transfusion fact sheet, Incentive Spirometry.  Remember: Type/Screen "Blue armsbands"- may not be removed once applied(would result in being retested AM of surgery, if removed). ________________________________________________________________________  [facility] - Preparing for Surgery Before surgery, you can play an important role.  Because skin is not sterile, your skin needs to be as free of germs as possible.  You can reduce the number of germs on your skin by washing with CHG (chlorahexidine gluconate) soap before surgery.  CHG is an antiseptic cleaner which kills germs and bonds with the skin to continue  killing germs even after washing. Please DO NOT use if you have an allergy to CHG or antibacterial soaps.  If your skin becomes reddened/irritated stop using the CHG and inform your nurse when you arrive at Short Stay. Do not shave (including legs and underarms) for at least 48 hours prior to the first CHG shower.  You may shave your face/neck. Please follow these instructions carefully:  1.  Shower with CHG Soap the night before surgery and the  morning of Surgery.  2.  If you choose to wash your hair, wash your hair first as usual with your  normal  shampoo.  3.  After you shampoo, rinse your hair and body thoroughly to remove the  shampoo.                           4.  Use CHG as you would any other liquid soap.  You can apply chg directly  to the skin and wash                       Gently with a scrungie or clean washcloth.  5.  Apply the CHG Soap to your body ONLY FROM THE NECK DOWN.   Do not use on face/ open  Wound or open sores. Avoid contact with eyes, ears mouth and genitals (private parts).                       Wash face,  Genitals (private parts) with your normal soap.             6.  Wash thoroughly, paying special attention to the area where your surgery  will be performed.  7.  Thoroughly rinse your body with warm water from the neck down.  8.  DO NOT shower/wash with your normal soap after using and rinsing off  the CHG Soap.                9.  Pat yourself dry with a clean towel.            10.  Wear clean pajamas.            11.  Place clean sheets on your bed the night of your first shower and do not  sleep with pets. Day of Surgery : Do not apply any lotions/deodorants the morning of surgery.  Please wear clean clothes to the hospital/surgery center.  FAILURE TO FOLLOW THESE INSTRUCTIONS MAY RESULT IN THE CANCELLATION OF YOUR SURGERY PATIENT SIGNATURE_________________________________  NURSE  SIGNATURE__________________________________  ________________________________________________________________________  WHAT IS A BLOOD TRANSFUSION? Blood Transfusion Information  A transfusion is the replacement of blood or some of its parts. Blood is made up of multiple cells which provide different functions.  Red blood cells carry oxygen and are used for blood loss replacement.  White blood cells fight against infection.  Platelets control bleeding.  Plasma helps clot blood.  Other blood products are available for specialized needs, such as hemophilia or other clotting disorders. BEFORE THE TRANSFUSION  Who gives blood for transfusions?   Healthy volunteers who are fully evaluated to make sure their blood is safe. This is blood bank blood. Transfusion therapy is the safest it has ever been in the practice of medicine. Before blood is taken from a donor, a complete history is taken to make sure that person has no history of diseases nor engages in risky social behavior (examples are intravenous drug use or sexual activity with multiple partners). The donor's travel history is screened to minimize risk of transmitting infections, such as malaria. The donated blood is tested for signs of infectious diseases, such as HIV and hepatitis. The blood is then tested to be sure it is compatible with you in order to minimize the chance of a transfusion reaction. If you or a relative donates blood, this is often done in anticipation of surgery and is not appropriate for emergency situations. It takes many days to process the donated blood. RISKS AND COMPLICATIONS Although transfusion therapy is very safe and saves many lives, the main dangers of transfusion include:   Getting an infectious disease.  Developing a transfusion reaction. This is an allergic reaction to something in the blood you were given. Every precaution is taken to prevent this. The decision to have a blood transfusion has been  considered carefully by your caregiver before blood is given. Blood is not given unless the benefits outweigh the risks. AFTER THE TRANSFUSION  Right after receiving a blood transfusion, you will usually feel much better and more energetic. This is especially true if your red blood cells have gotten low (anemic). The transfusion raises the level of the red blood cells which carry oxygen, and this usually causes an energy increase.  The nurse administering the transfusion will monitor you carefully for complications. HOME CARE INSTRUCTIONS  No special instructions are needed after a transfusion. You may find your energy is better. Speak with your caregiver about any limitations on activity for underlying diseases you may have. SEEK MEDICAL CARE IF:   Your condition is not improving after your transfusion.  You develop redness or irritation at the intravenous (IV) site. SEEK IMMEDIATE MEDICAL CARE IF:  Any of the following symptoms occur over the next 12 hours:  Shaking chills.  You have a temperature by mouth above 102 F (38.9 C), not controlled by medicine.  Chest, back, or muscle pain.  People around you feel you are not acting correctly or are confused.  Shortness of breath or difficulty breathing.  Dizziness and fainting.  You get a rash or develop hives.  You have a decrease in urine output.  Your urine turns a dark color or changes to pink, red, or brown. Any of the following symptoms occur over the next 10 days:  You have a temperature by mouth above 102 F (38.9 C), not controlled by medicine.  Shortness of breath.  Weakness after normal activity.  The white part of the eye turns yellow (jaundice).  You have a decrease in the amount of urine or are urinating less often.  Your urine turns a dark color or changes to pink, red, or brown. Document Released: 02/07/2000 Document Revised: 05/04/2011 Document Reviewed: 09/26/2007 [facility] Patient Information 2014  [place], [place].  _______________________________________________________________________

## 2014-01-30 NOTE — Progress Notes (Signed)
Pts test results per PAT visit 01/30/2014 for Staph routed to Dr Abbie Sons per epic. Prescription for Murpirocin called to [facility] spoke with Abbe Amsterdam. Pt is aware.

## 2014-02-01 NOTE — H&P (Signed)
TOTAL KNEE ADMISSION H&P  Patient is being admitted for right total knee arthroplasty.  Subjective:  Chief Complaint:right knee pain.  HPI: Juan Wiggins, 63 y.o. male, has a history of pain and functional disability in the right knee due to arthritis and has failed non-surgical conservative treatments for greater than 12 weeks to includeNSAID's and/or analgesics, corticosteriod injections, viscosupplementation injections, flexibility and strengthening excercises and activity modification.  Onset of symptoms was gradual, starting 5 years ago with gradually worsening course since that time. The patient noted prior procedures on the knee to include  arthroscopy and menisectomy on the right knee(s).  Patient currently rates pain in the right knee(s) at 8 out of 10 with activity. Patient has night pain, worsening of pain with activity and weight bearing, pain that interferes with activities of daily living, pain with passive range of motion, crepitus and joint swelling.  Patient has evidence of periarticular osteophytes and joint space narrowing by imaging studies.  There is no active infection.  Patient Active Problem List   Diagnosis Date Noted  . Cellulitis and abscess of leg 09/01/2013  . Cellulitis left knee 08/31/2013  . Essential hypertension, benign 08/28/2013  . Acute blood loss anemia 08/28/2013  . GERD (gastroesophageal reflux disease) 08/28/2013  . Osteoarthritis of left knee 08/23/2013  . Total knee replacement status 08/23/2013  . Alcohol dependence 07/14/2013  . Alcohol dependency 07/07/2013  . Major depression 07/07/2013  . Alcohol dependence with alcohol-induced disorder 07/07/2013  . Varicose veins of lower extremities with other complications 01/75/1025  . Recurrent falls 04/14/2011  . Hypotension 04/13/2011  . ARF (acute renal failure) 04/13/2011  . Hypothyroidism 04/13/2011  . Prolapsed and thrombosed 09/03/2010   Past Medical History  Diagnosis Date  . Hypertension    . Thyroid disease   . Hemorrhoids   . Hypothyroidism   . Tachycardia     PT STATES HIS HEART RATE USUALLY 100 OR MORE  . Depression   . Anxiety   . GERD (gastroesophageal reflux disease)     PREVACID IF NEEDED  . Arthritis     RHEUMATOID ARTHRITIS; OA LEFT KNEE  . Cancer     MELANOMA REMOVED RT SHOULDER  . 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 Sleep apnea     CLAUSTROPHOBIC - COULD NOT TOLERATE CPAP MASK  . Heart murmur     Past Surgical History  Procedure Laterality Date  . Knee surgery      BILATERAL KNEE ARTHROSCOPY  . Shoulder surgery      RIGHT ROTATOR CUFF REPAIR AND LEFT ARTHROSCOPY  . Elbow surgery      BILATERAL ELBOW SURGERY  . Refractive surgery    . Total knee arthroplasty Left 08/23/2013    Procedure: LEFT TOTAL KNEE ARTHROPLASTY;  Surgeon: Tobi Bastos, MD;  Location: [facility];  Service: Orthopedics;  Laterality: Left;  . Colonscopy     . Left tkr      July 2015     Current outpatient prescriptions:  ALPRAZolam (XANAX) 0.25 MG tablet, Take 0.25 mg by mouth every morning., Disp: , Rfl: ;   Cyanocobalamin (VITAMIN B-12 PO), Take by mouth at bedtime. A dropper full (solution), Disp: , Rfl: ;   escitalopram (LEXAPRO) 20 MG tablet, Take 20 mg by mouth every morning., Disp: , Rfl: ;  ferrous sulfate 325 (  65 FE) MG tablet, Take 1 tablet (325 mg total) by mouth 3 (three) times daily after meals., Disp: 30 tablet, Rfl: 0 Lansoprazole (PREVACID PO), Take 1 tablet by mouth daily as needed (acid reflux). , Disp: , Rfl: ;   levothyroxine (SYNTHROID, LEVOTHROID) 200 MCG tablet, Take 200 mcg by mouth daily before breakfast., Disp: , Rfl: ;   lisinopril (PRINIVIL,ZESTRIL) 10 MG tablet, Take 10 mg by mouth every morning., Disp: , Rfl:  methocarbamol (ROBAXIN) 500 MG tablet, Take 1 tablet (500 mg total) by mouth every 6 (six) hours as needed for muscle spasms.,  Disp: 40 tablet, Rfl: 1;   OLANZapine-FLUoxetine (SYMBYAX) 12-50 MG per capsule, Take 1 capsule by mouth at bedtime., Disp: , Rfl: ;   OVER THE COUNTER MEDICATION, Place into both eyes daily as needed (contacts.). Flushes eyes, Disp: , Rfl:  oxycodone (ROXICODONE) 30 MG immediate release tablet, Take 30 mg by mouth every 3 (three) hours as needed for pain., Disp: , Rfl: ;   zolpidem (AMBIEN) 10 MG tablet, Take 10 mg by mouth at bedtime as needed for sleep., Disp: , Rfl: ;     No Known Allergies  History  Substance Use Topics  . Smoking status: Former Smoker -- 1.50 packs/day for 35 years    Types: Cigars    Quit date: 02/22/1998  . Smokeless tobacco: Never Used     Comment: used cigarettes 20 years ago  . Alcohol Use: Yes     Comment: 6-8 oz daily of scotch as stated per pt currently.   PT STATES NOW DRINKING 3 OR 4 GLASSES SCOTCH- HAS BEEN IN REHAB FOR ALCOHOLISM FEB 2015  FORMER CIGARETTE SMOKER - QUIT 1999     Family History  Problem Relation Age of Onset  . Hypertension Mother   . Other Mother     varicose veins  . Hypertension Sister      Review of Systems  Constitutional: Negative.   HENT: Positive for tinnitus. Negative for congestion, ear discharge, ear pain, hearing loss, nosebleeds and sore throat.   Eyes: Negative.   Respiratory: Negative.  Negative for stridor.   Cardiovascular: Negative.   Gastrointestinal: Positive for heartburn. Negative for nausea, vomiting, abdominal pain, diarrhea, constipation, blood in stool and melena.  Genitourinary: Negative.   Musculoskeletal: Positive for back pain and joint pain. Negative for myalgias, falls and neck pain.       Right knee pain  Skin: Negative.   Neurological: Negative.  Negative for headaches.  Endo/Heme/Allergies: Negative.   Psychiatric/Behavioral: Negative.     Objective:  Physical Exam  Constitutional: He is oriented to person, place, and time. He appears well-developed and well-nourished. No distress.   HENT:  Head: Normocephalic and atraumatic.  Right Ear: External ear normal.  Left Ear: External ear normal.  Nose: Nose normal.  Mouth/Throat: Oropharynx is clear and moist.  Eyes: Conjunctivae and EOM are normal.  Neck: Normal range of motion. Neck supple.  Cardiovascular: Normal rate, regular rhythm, normal heart sounds and intact distal pulses.   No murmur heard. Respiratory: Effort normal. No respiratory distress. He has no wheezes.  GI: Soft. Bowel sounds are normal. He exhibits no distension. There is no tenderness.  Musculoskeletal:       Right hip: Normal.       Left hip: Normal.       Right knee: He exhibits decreased range of motion and swelling. He exhibits no effusion and no erythema. Tenderness found. Medial joint line and lateral joint line tenderness  noted.       Left knee: Normal.       Right lower leg: He exhibits no tenderness and no swelling.       Left lower leg: He exhibits no tenderness and no swelling.  Incision over left total knee healed with no signs of infection  Neurological: He is alert and oriented to person, place, and time. He has normal strength and normal reflexes. No sensory deficit.  Skin: No rash noted. He is not diaphoretic. No erythema.  Psychiatric: He has a normal mood and affect. His behavior is normal.    Vitals  Weight: 218 lb Height: 71.5in Body Surface Area: 2.2 m Body Mass Index: 29.98 kg/m  Pulse: 76 (Regular)  BP: 132/80 (Sitting, Left Arm, Standard)  Imaging Review Plain radiographs demonstrate severe degenerative joint disease of the right knee(s). The overall alignment ismild varus. The bone quality appears to be good for age and reported activity level.  Assessment/Plan:  End stage arthritis, right knee   The patient history, physical examination, clinical judgment of the provider and imaging studies are consistent with end stage degenerative joint disease of the right knee(s) and total knee arthroplasty is deemed  medically necessary. The treatment options including medical management, injection therapy arthroscopy and arthroplasty were discussed at length. The risks and benefits of total knee arthroplasty were presented and reviewed. The risks due to aseptic loosening, infection, stiffness, patella tracking problems, thromboembolic complications and other imponderables were discussed. The patient acknowledged the explanation, agreed to proceed with the plan and consent was signed. Patient is being admitted for inpatient treatment for surgery, pain control, PT, OT, prophylactic antibiotics, VTE prophylaxis, progressive ambulation and ADL's and discharge planning. The patient is planning to be discharged to skilled nursing facility ([facility])  [place], [place]

## 2014-02-07 ENCOUNTER — Inpatient Hospital Stay (HOSPITAL_COMMUNITY)
Admission: RE | Admit: 2014-02-07 | Discharge: 2014-02-09 | DRG: 470 | Disposition: A | Discharge status: Skilled Nursing Facility | Payer: 59 | Source: Ambulatory Visit | Attending: Orthopedic Surgery | Admitting: Orthopedic Surgery

## 2014-02-07 ENCOUNTER — Encounter (HOSPITAL_COMMUNITY): Payer: Self-pay | Admitting: *Deleted

## 2014-02-07 ENCOUNTER — Inpatient Hospital Stay (HOSPITAL_COMMUNITY): Payer: 59 | Admitting: Anesthesiology

## 2014-02-07 ENCOUNTER — Encounter (HOSPITAL_COMMUNITY)
Admission: RE | Disposition: A | Discharge status: Skilled Nursing Facility | Payer: Self-pay | Source: Ambulatory Visit | Attending: Orthopedic Surgery

## 2014-02-07 DIAGNOSIS — F1010 Alcohol abuse, uncomplicated: Secondary | ICD-10-CM | POA: Diagnosis present

## 2014-02-07 DIAGNOSIS — M1711 Unilateral primary osteoarthritis, right knee: Principal | ICD-10-CM | POA: Diagnosis present

## 2014-02-07 DIAGNOSIS — M1990 Unspecified osteoarthritis, unspecified site: Secondary | ICD-10-CM | POA: Diagnosis present

## 2014-02-07 DIAGNOSIS — K219 Gastro-esophageal reflux disease without esophagitis: Secondary | ICD-10-CM | POA: Diagnosis present

## 2014-02-07 DIAGNOSIS — E039 Hypothyroidism, unspecified: Secondary | ICD-10-CM | POA: Diagnosis present

## 2014-02-07 DIAGNOSIS — Z87891 Personal history of nicotine dependence: Secondary | ICD-10-CM

## 2014-02-07 DIAGNOSIS — M069 Rheumatoid arthritis, unspecified: Secondary | ICD-10-CM | POA: Diagnosis present

## 2014-02-07 DIAGNOSIS — F40240 Claustrophobia: Secondary | ICD-10-CM | POA: Diagnosis present

## 2014-02-07 DIAGNOSIS — I10 Essential (primary) hypertension: Secondary | ICD-10-CM | POA: Diagnosis present

## 2014-02-07 DIAGNOSIS — M24561 Contracture, right knee: Secondary | ICD-10-CM | POA: Diagnosis present

## 2014-02-07 DIAGNOSIS — M179 Osteoarthritis of knee, unspecified: Secondary | ICD-10-CM | POA: Diagnosis present

## 2014-02-07 DIAGNOSIS — Z8249 Family history of ischemic heart disease and other diseases of the circulatory system: Secondary | ICD-10-CM

## 2014-02-07 DIAGNOSIS — Z96659 Presence of unspecified artificial knee joint: Secondary | ICD-10-CM

## 2014-02-07 HISTORY — PX: TOTAL KNEE ARTHROPLASTY: SHX125

## 2014-02-07 LAB — TYPE AND SCREEN
ABO/RH(D): A NEG
Antibody Screen: NEGATIVE

## 2014-02-07 SURGERY — ARTHROPLASTY, KNEE, TOTAL
Anesthesia: General | Site: Knee | Laterality: Right

## 2014-02-07 MED ORDER — HYDROMORPHONE HCL 1 MG/ML IJ SOLN
INTRAMUSCULAR | Status: DC | PRN
  Administered 2014-02-07: 1 mg via INTRAVENOUS
  Administered 2014-02-07 (×2): 0.5 mg via INTRAVENOUS

## 2014-02-07 MED ORDER — OXYCODONE-ACETAMINOPHEN 5-325 MG PO TABS
2.0000 | ORAL_TABLET | ORAL | Status: DC | PRN
Start: 2014-02-07 — End: 2014-02-09
  Administered 2014-02-08 – 2014-02-09 (×4): 2 via ORAL
  Filled 2014-02-07 (×4): qty 2

## 2014-02-07 MED ORDER — SENNOSIDES-DOCUSATE SODIUM 8.6-50 MG PO TABS: 1.0000 | ORAL_TABLET | Freq: Every evening | ORAL | Status: DC | PRN

## 2014-02-07 MED ORDER — MIDAZOLAM HCL 2 MG/2ML IJ SOLN
INTRAMUSCULAR | Status: AC
  Filled 2014-02-07: qty 2

## 2014-02-07 MED ORDER — SODIUM CHLORIDE 0.9 % IR SOLN
Status: DC | PRN
  Administered 2014-02-07: 500 mL

## 2014-02-07 MED ORDER — LORAZEPAM 2 MG/ML IJ SOLN
1.0000 mg | Freq: Four times a day (QID) | INTRAMUSCULAR | Status: DC | PRN
  Administered 2014-02-07 – 2014-02-09 (×2): 1 mg via INTRAVENOUS
  Filled 2014-02-07 (×2): qty 1

## 2014-02-07 MED ORDER — CHLORHEXIDINE GLUCONATE 4 % EX LIQD: 60.0000 mL | Freq: Once | CUTANEOUS | Status: DC

## 2014-02-07 MED ORDER — NEOSTIGMINE METHYLSULFATE 10 MG/10ML IV SOLN
INTRAVENOUS | Status: AC
  Filled 2014-02-07: qty 1

## 2014-02-07 MED ORDER — BUPIVACAINE HCL (PF) 0.25 % IJ SOLN
INTRAMUSCULAR | Status: AC
  Filled 2014-02-07: qty 30

## 2014-02-07 MED ORDER — HYDROMORPHONE HCL 1 MG/ML IJ SOLN
INTRAMUSCULAR | Status: AC
  Administered 2014-02-07: 1 mg
  Filled 2014-02-07: qty 1

## 2014-02-07 MED ORDER — ONDANSETRON HCL 4 MG PO TABS: 4.0000 mg | ORAL_TABLET | Freq: Four times a day (QID) | ORAL | Status: DC | PRN

## 2014-02-07 MED ORDER — SODIUM CHLORIDE 0.9 % IR SOLN
Status: AC
  Filled 2014-02-07: qty 1

## 2014-02-07 MED ORDER — BUPIVACAINE LIPOSOME 1.3 % IJ SUSP
20.0000 mL | Freq: Once | INTRAMUSCULAR | Status: AC
  Administered 2014-02-07: 20 mL
  Filled 2014-02-07: qty 20

## 2014-02-07 MED ORDER — ROCURONIUM BROMIDE 100 MG/10ML IV SOLN
INTRAVENOUS | Status: AC
  Filled 2014-02-07: qty 1

## 2014-02-07 MED ORDER — VITAMIN B-1 100 MG PO TABS
100.0000 mg | ORAL_TABLET | Freq: Every day | ORAL | Status: DC
  Administered 2014-02-07 – 2014-02-09 (×3): 100 mg via ORAL
  Filled 2014-02-07 (×4): qty 1

## 2014-02-07 MED ORDER — METHOCARBAMOL 500 MG PO TABS
500.0000 mg | ORAL_TABLET | Freq: Four times a day (QID) | ORAL | Status: DC | PRN
  Administered 2014-02-07 – 2014-02-09 (×4): 500 mg via ORAL
  Filled 2014-02-07 (×4): qty 1

## 2014-02-07 MED ORDER — FENTANYL CITRATE 0.05 MG/ML IJ SOLN
INTRAMUSCULAR | Status: DC | PRN
  Administered 2014-02-07: 100 ug via INTRAVENOUS

## 2014-02-07 MED ORDER — MENTHOL 3 MG MT LOZG: 1.0000 | LOZENGE | OROMUCOSAL | Status: DC | PRN

## 2014-02-07 MED ORDER — HYDROMORPHONE HCL 1 MG/ML IJ SOLN
INTRAMUSCULAR | Status: AC
  Filled 2014-02-07: qty 1

## 2014-02-07 MED ORDER — CEFAZOLIN SODIUM-DEXTROSE 2-3 GM-% IV SOLR
2.0000 g | INTRAVENOUS | Status: AC
  Administered 2014-02-07: 2 g via INTRAVENOUS

## 2014-02-07 MED ORDER — CEFAZOLIN SODIUM-DEXTROSE 2-3 GM-% IV SOLR
INTRAVENOUS | Status: AC
  Filled 2014-02-07: qty 50

## 2014-02-07 MED ORDER — GLYCOPYRROLATE 0.2 MG/ML IJ SOLN
INTRAMUSCULAR | Status: DC | PRN
  Administered 2014-02-07: 0.4 mg via INTRAVENOUS

## 2014-02-07 MED ORDER — PROPOFOL 10 MG/ML IV BOLUS
INTRAVENOUS | Status: AC
  Filled 2014-02-07: qty 20

## 2014-02-07 MED ORDER — LIDOCAINE HCL (CARDIAC) 20 MG/ML IV SOLN
INTRAVENOUS | Status: AC
  Filled 2014-02-07: qty 5

## 2014-02-07 MED ORDER — FOLIC ACID 1 MG PO TABS
1.0000 mg | ORAL_TABLET | Freq: Every day | ORAL | Status: DC
  Administered 2014-02-07 – 2014-02-09 (×3): 1 mg via ORAL
  Filled 2014-02-07 (×4): qty 1

## 2014-02-07 MED ORDER — HYDROMORPHONE HCL 1 MG/ML IJ SOLN
1.0000 mg | INTRAMUSCULAR | Status: DC | PRN
  Administered 2014-02-07 – 2014-02-08 (×2): 1 mg via INTRAVENOUS
  Filled 2014-02-07 (×3): qty 1

## 2014-02-07 MED ORDER — CELECOXIB 200 MG PO CAPS
200.0000 mg | ORAL_CAPSULE | Freq: Two times a day (BID) | ORAL | Status: DC
  Administered 2014-02-07 – 2014-02-09 (×4): 200 mg via ORAL
  Filled 2014-02-07 (×6): qty 1

## 2014-02-07 MED ORDER — GLYCOPYRROLATE 0.2 MG/ML IJ SOLN
INTRAMUSCULAR | Status: AC
  Filled 2014-02-07: qty 2

## 2014-02-07 MED ORDER — FERROUS SULFATE 325 (65 FE) MG PO TABS
325.0000 mg | ORAL_TABLET | Freq: Three times a day (TID) | ORAL | Status: DC
  Administered 2014-02-08 – 2014-02-09 (×4): 325 mg via ORAL
  Filled 2014-02-07 (×7): qty 1

## 2014-02-07 MED ORDER — ACETAMINOPHEN 650 MG RE SUPP: 650.0000 mg | Freq: Four times a day (QID) | RECTAL | Status: DC | PRN

## 2014-02-07 MED ORDER — FENTANYL CITRATE 0.05 MG/ML IJ SOLN
INTRAMUSCULAR | Status: AC
  Filled 2014-02-07: qty 2

## 2014-02-07 MED ORDER — ROCURONIUM BROMIDE 100 MG/10ML IV SOLN
INTRAVENOUS | Status: DC | PRN
  Administered 2014-02-07: 25 mg via INTRAVENOUS
  Administered 2014-02-07: 5 mg via INTRAVENOUS

## 2014-02-07 MED ORDER — HYDROMORPHONE HCL 2 MG/ML IJ SOLN
INTRAMUSCULAR | Status: AC
  Filled 2014-02-07: qty 1

## 2014-02-07 MED ORDER — SODIUM CHLORIDE 0.9 % IV SOLN
1000.0000 mg | INTRAVENOUS | Status: AC
  Administered 2014-02-07: 1000 mg via INTRAVENOUS
  Filled 2014-02-07: qty 10

## 2014-02-07 MED ORDER — HYDROMORPHONE HCL 1 MG/ML IJ SOLN
0.2500 mg | INTRAMUSCULAR | Status: DC | PRN
  Administered 2014-02-07 (×3): 0.5 mg via INTRAVENOUS
  Administered 2014-02-07 (×2): 0.25 mg via INTRAVENOUS

## 2014-02-07 MED ORDER — LACTATED RINGERS IV SOLN: INTRAVENOUS | Status: DC

## 2014-02-07 MED ORDER — PROPOFOL 10 MG/ML IV BOLUS
INTRAVENOUS | Status: DC | PRN
  Administered 2014-02-07: 180 mg via INTRAVENOUS

## 2014-02-07 MED ORDER — BUPIVACAINE HCL (PF) 0.25 % IJ SOLN
INTRAMUSCULAR | Status: DC | PRN
  Administered 2014-02-07: 20 mL

## 2014-02-07 MED ORDER — LACTATED RINGERS IV SOLN
INTRAVENOUS | Status: DC
  Administered 2014-02-07 – 2014-02-08 (×3): via INTRAVENOUS

## 2014-02-07 MED ORDER — NEOSTIGMINE METHYLSULFATE 10 MG/10ML IV SOLN
INTRAVENOUS | Status: DC | PRN
  Administered 2014-02-07: 3 mg via INTRAVENOUS

## 2014-02-07 MED ORDER — SODIUM CHLORIDE 0.9 % IJ SOLN
INTRAMUSCULAR | Status: AC
  Filled 2014-02-07: qty 50

## 2014-02-07 MED ORDER — HYDROMORPHONE HCL 1 MG/ML IJ SOLN
INTRAMUSCULAR | Status: AC
  Administered 2014-02-07: 0.25 mg via INTRAVENOUS
  Filled 2014-02-07: qty 1

## 2014-02-07 MED ORDER — THROMBIN 5000 UNITS EX SOLR
CUTANEOUS | Status: DC | PRN
  Administered 2014-02-07: 5 mL via TOPICAL

## 2014-02-07 MED ORDER — THROMBIN 5000 UNITS EX SOLR
CUTANEOUS | Status: AC
  Filled 2014-02-07: qty 5000

## 2014-02-07 MED ORDER — THIAMINE HCL 100 MG/ML IJ SOLN
100.0000 mg | Freq: Every day | INTRAMUSCULAR | Status: DC
  Filled 2014-02-07 (×3): qty 1

## 2014-02-07 MED ORDER — ADULT MULTIVITAMIN W/MINERALS CH
1.0000 | ORAL_TABLET | Freq: Every day | ORAL | Status: DC
  Administered 2014-02-07 – 2014-02-09 (×3): 1 via ORAL
  Filled 2014-02-07 (×4): qty 1

## 2014-02-07 MED ORDER — PHENOL 1.4 % MT LIQD: 1.0000 | OROMUCOSAL | Status: DC | PRN

## 2014-02-07 MED ORDER — RIVAROXABAN 10 MG PO TABS
10.0000 mg | ORAL_TABLET | Freq: Every day | ORAL | Status: DC
  Administered 2014-02-08 – 2014-02-09 (×2): 10 mg via ORAL
  Filled 2014-02-07 (×3): qty 1

## 2014-02-07 MED ORDER — HYDROCODONE-ACETAMINOPHEN 5-325 MG PO TABS
1.0000 | ORAL_TABLET | ORAL | Status: DC | PRN
  Administered 2014-02-07 – 2014-02-08 (×7): 2 via ORAL
  Filled 2014-02-07 (×7): qty 2

## 2014-02-07 MED ORDER — ONDANSETRON HCL 4 MG/2ML IJ SOLN
4.0000 mg | Freq: Four times a day (QID) | INTRAMUSCULAR | Status: DC | PRN
  Administered 2014-02-07 – 2014-02-09 (×3): 4 mg via INTRAVENOUS
  Filled 2014-02-07 (×3): qty 2

## 2014-02-07 MED ORDER — CEFAZOLIN SODIUM 1-5 GM-% IV SOLN
1.0000 g | Freq: Four times a day (QID) | INTRAVENOUS | Status: AC
  Administered 2014-02-07 (×2): 1 g via INTRAVENOUS
  Filled 2014-02-07 (×2): qty 50

## 2014-02-07 MED ORDER — ACETAMINOPHEN 325 MG PO TABS: 650.0000 mg | ORAL_TABLET | Freq: Four times a day (QID) | ORAL | Status: DC | PRN

## 2014-02-07 MED ORDER — BISACODYL 5 MG PO TBEC: 5.0000 mg | DELAYED_RELEASE_TABLET | Freq: Every day | ORAL | Status: DC | PRN

## 2014-02-07 MED ORDER — ALUM & MAG HYDROXIDE-SIMETH 200-200-20 MG/5ML PO SUSP
30.0000 mL | ORAL | Status: DC | PRN
  Administered 2014-02-08: 30 mL via ORAL
  Filled 2014-02-07 (×2): qty 30

## 2014-02-07 MED ORDER — LACTATED RINGERS IV SOLN
INTRAVENOUS | Status: DC | PRN
  Administered 2014-02-07 (×3): via INTRAVENOUS

## 2014-02-07 MED ORDER — SUCCINYLCHOLINE CHLORIDE 20 MG/ML IJ SOLN
INTRAMUSCULAR | Status: DC | PRN
  Administered 2014-02-07: 100 mg via INTRAVENOUS

## 2014-02-07 MED ORDER — LORAZEPAM 1 MG PO TABS
1.0000 mg | ORAL_TABLET | Freq: Four times a day (QID) | ORAL | Status: DC | PRN
  Administered 2014-02-07 – 2014-02-08 (×4): 1 mg via ORAL
  Filled 2014-02-07 (×4): qty 1

## 2014-02-07 MED ORDER — ONDANSETRON HCL 4 MG/2ML IJ SOLN
INTRAMUSCULAR | Status: DC | PRN
Start: 2014-02-07 — End: 2014-02-07
  Administered 2014-02-07: 4 mg via INTRAVENOUS

## 2014-02-07 MED ORDER — SODIUM CHLORIDE 0.9 % IJ SOLN
INTRAMUSCULAR | Status: DC | PRN
  Administered 2014-02-07: 20 mL

## 2014-02-07 MED ORDER — METHOCARBAMOL 1000 MG/10ML IJ SOLN
500.0000 mg | Freq: Four times a day (QID) | INTRAMUSCULAR | Status: DC | PRN
  Administered 2014-02-07: 500 mg via INTRAVENOUS
  Filled 2014-02-07 (×2): qty 5

## 2014-02-07 MED ORDER — MIDAZOLAM HCL 5 MG/5ML IJ SOLN
INTRAMUSCULAR | Status: DC | PRN
  Administered 2014-02-07: 2 mg via INTRAVENOUS

## 2014-02-07 MED ORDER — FLEET ENEMA 7-19 GM/118ML RE ENEM: 1.0000 | ENEMA | Freq: Once | RECTAL | Status: AC | PRN

## 2014-02-07 SURGICAL SUPPLY — 66 items
BAG ZIPLOCK 12X15 (MISCELLANEOUS) IMPLANT
BANDAGE ELASTIC 4 VELCRO ST LF (GAUZE/BANDAGES/DRESSINGS) ×3 IMPLANT
BANDAGE ELASTIC 6 VELCRO ST LF (GAUZE/BANDAGES/DRESSINGS) ×3 IMPLANT
BANDAGE ESMARK 6X9 LF (GAUZE/BANDAGES/DRESSINGS) ×1 IMPLANT
BLADE SAG 18X100X1.27 (BLADE) ×3 IMPLANT
BLADE SAW SGTL 11.0X1.19X90.0M (BLADE) ×3 IMPLANT
BNDG ESMARK 6X9 LF (GAUZE/BANDAGES/DRESSINGS) ×3
BONE CEMENT GENTAMICIN (Cement) ×6 IMPLANT
CAP KNEE TOTAL 3 SIGMA ×3 IMPLANT
CEMENT BONE GENTAMICIN 40 (Cement) ×2 IMPLANT
CUFF TOURN SGL QUICK 34 (TOURNIQUET CUFF) ×2
CUFF TRNQT CYL 34X4X40X1 (TOURNIQUET CUFF) ×1 IMPLANT
DRAPE EXTREMITY T 121X128X90 (DRAPE) ×3 IMPLANT
DRAPE INCISE IOBAN 66X45 STRL (DRAPES) ×3 IMPLANT
DRAPE POUCH INSTRU U-SHP 10X18 (DRAPES) ×3 IMPLANT
DRAPE U-SHAPE 47X51 STRL (DRAPES) ×3 IMPLANT
DRSG AQUACEL AG ADV 3.5X10 (GAUZE/BANDAGES/DRESSINGS) ×3 IMPLANT
DRSG PAD ABDOMINAL 8X10 ST (GAUZE/BANDAGES/DRESSINGS) IMPLANT
DRSG TEGADERM 4X4.75 (GAUZE/BANDAGES/DRESSINGS) ×3 IMPLANT
DURAPREP 26ML APPLICATOR (WOUND CARE) ×3 IMPLANT
ELECT REM PT RETURN 9FT ADLT (ELECTROSURGICAL) ×3
ELECTRODE REM PT RTRN 9FT ADLT (ELECTROSURGICAL) ×1 IMPLANT
EVACUATOR 1/8 PVC DRAIN (DRAIN) ×3 IMPLANT
FACESHIELD WRAPAROUND (MASK) ×15 IMPLANT
GAUZE SPONGE 2X2 8PLY STRL LF (GAUZE/BANDAGES/DRESSINGS) ×1 IMPLANT
GLOVE BIOGEL PI IND STRL 6.5 (GLOVE) ×2 IMPLANT
GLOVE BIOGEL PI IND STRL 8 (GLOVE) ×1 IMPLANT
GLOVE BIOGEL PI INDICATOR 6.5 (GLOVE) ×4
GLOVE BIOGEL PI INDICATOR 8 (GLOVE) ×2
GLOVE ECLIPSE 8.0 STRL XLNG CF (GLOVE) ×6 IMPLANT
GLOVE SURG SS PI 6.5 STRL IVOR (GLOVE) ×3 IMPLANT
GOWN STRL REUS W/TWL LRG LVL3 (GOWN DISPOSABLE) ×9 IMPLANT
GOWN STRL REUS W/TWL XL LVL3 (GOWN DISPOSABLE) ×3 IMPLANT
HANDPIECE INTERPULSE COAX TIP (DISPOSABLE) ×2
IMMOBILIZER KNEE 20 (SOFTGOODS) ×3
IMMOBILIZER KNEE 20 THIGH 36 (SOFTGOODS) ×1 IMPLANT
KIT BASIN OR (CUSTOM PROCEDURE TRAY) ×3 IMPLANT
LIQUID BAND (GAUZE/BANDAGES/DRESSINGS) ×6 IMPLANT
MANIFOLD NEPTUNE II (INSTRUMENTS) ×3 IMPLANT
NEEDLE HYPO 22GX1.5 SAFETY (NEEDLE) ×6 IMPLANT
NS IRRIG 1000ML POUR BTL (IV SOLUTION) IMPLANT
PACK TOTAL JOINT (CUSTOM PROCEDURE TRAY) ×3 IMPLANT
PADDING CAST COTTON 6X4 STRL (CAST SUPPLIES) IMPLANT
POSITIONER SURGICAL ARM (MISCELLANEOUS) ×3 IMPLANT
SET HNDPC FAN SPRY TIP SCT (DISPOSABLE) ×1 IMPLANT
SET PAD KNEE POSITIONER (MISCELLANEOUS) ×3 IMPLANT
SPONGE GAUZE 2X2 STER 10/PKG (GAUZE/BANDAGES/DRESSINGS) ×2
SPONGE LAP 18X18 X RAY DECT (DISPOSABLE) ×3 IMPLANT
SPONGE SURGIFOAM ABS GEL 100 (HEMOSTASIS) ×3 IMPLANT
STAPLER VISISTAT 35W (STAPLE) IMPLANT
SUCTION FRAZIER 12FR DISP (SUCTIONS) ×3 IMPLANT
SUT BONE WAX W31G (SUTURE) ×3 IMPLANT
SUT MNCRL AB 4-0 PS2 18 (SUTURE) ×3 IMPLANT
SUT VIC AB 1 CT1 27 (SUTURE) ×4
SUT VIC AB 1 CT1 27XBRD ANTBC (SUTURE) ×2 IMPLANT
SUT VIC AB 2-0 CT1 27 (SUTURE) ×8
SUT VIC AB 2-0 CT1 TAPERPNT 27 (SUTURE) ×4 IMPLANT
SUT VLOC 180 0 24IN GS25 (SUTURE) ×3 IMPLANT
SYR 20CC LL (SYRINGE) ×9 IMPLANT
TOWEL OR 17X26 10 PK STRL BLUE (TOWEL DISPOSABLE) ×3 IMPLANT
TOWEL OR NON WOVEN STRL DISP B (DISPOSABLE) IMPLANT
TOWER CARTRIDGE SMART MIX (DISPOSABLE) ×3 IMPLANT
TRAY FOLEY CATH 14FRSI W/METER (CATHETERS) IMPLANT
TRAY FOLEY CATH 16FRSI W/METER (SET/KITS/TRAYS/PACK) ×3 IMPLANT
WATER STERILE IRR 1500ML POUR (IV SOLUTION) ×6 IMPLANT
WRAP KNEE MAXI GEL POST OP (GAUZE/BANDAGES/DRESSINGS) ×6 IMPLANT

## 2014-02-07 NOTE — Plan of Care (Signed)
Problem: Consults Goal: Diagnosis- Total Joint Replacement Primary Total Knee     

## 2014-02-07 NOTE — Anesthesia Postprocedure Evaluation (Signed)
  Anesthesia Post-op Note  Patient: Juan Wiggins  Procedure(s) Performed: Procedure(s) (LRB): RIGHT TOTAL KNEE ARTHROPLASTY (Right)  Patient Location: PACU  Anesthesia Type: General  Level of Consciousness: awake and alert   Airway and Oxygen Therapy: Patient Spontanous Breathing  Post-op Pain: mild  Post-op Assessment: Post-op Vital signs reviewed, Patient's Cardiovascular Status Stable, Respiratory Function Stable, Patent Airway and No signs of Nausea or vomiting  Last Vitals:  Filed Vitals:   02/07/14 1800  BP: 144/81  Pulse: 83  Temp: 36.7 C  Resp: 16    Post-op Vital Signs: stable   Complications: No apparent anesthesia complications

## 2014-02-07 NOTE — Brief Op Note (Signed)
02/07/2014  1:03 PM  PATIENT:  Juan Wiggins  63 y.o. male  PRE-OPERATIVE DIAGNOSIS:Primary  OA OF RIGHT KNEE  POST-OPERATIVE DIAGNOSIS: Primary Osteoarthritis of the Right Knee  PROCEDURE:  Procedure(s): RIGHT TOTAL KNEE ARTHROPLASTY (Right)  SURGEON:  Surgeon(s) and Role:    * Tobi Bastos, MD - Primary  PHYSICIAN ASSISTANT:Amber [facility] PA  ASSISTANTS: Ardeen Jourdain PA  ANESTHESIA:   general  EBL:  Total I/O In: 1000 [I.V.:1000] Out: -   BLOOD ADMINISTERED:none  DRAINS: (One) Hemovact drain(s) in the Right Knee with  Suction Open   LOCAL MEDICATIONS USED:  MARCAINE 20cc of 0.50% Plain and 20cc of Exparel with 20cc of Normal Saline.  SPECIMEN:  No Specimen  DISPOSITION OF SPECIMEN:  N/A  COUNTS:  YES  TOURNIQUET:  * Missing tourniquet times found for documented tourniquets in log:  [id number] *  DICTATION: .Other Dictation: Dictation Number [id number]  PLAN OF CARE: Admit to inpatient   PATIENT DISPOSITION:  Stable in OR   Delay start of Pharmacological VTE agent (>24hrs) due to surgical blood loss or risk of bleeding: yes

## 2014-02-07 NOTE — Anesthesia Preprocedure Evaluation (Addendum)
Anesthesia Evaluation  Patient identified by MRN, date of birth, ID band Patient awake    Reviewed: Allergy & Precautions, H&P , NPO status , Patient's Chart, lab work & pertinent test results  Airway Mallampati: II  TM Distance: >3 FB Neck ROM: Full    Dental  (+) Dental Advisory Given, Chipped Left upper front chipped:   Pulmonary sleep apnea , Current Smoker, former smoker,  breath sounds clear to auscultation  Pulmonary exam normal       Cardiovascular Exercise Tolerance: Good hypertension, Pt. on medications + Peripheral Vascular Disease Rhythm:Regular Rate:Normal  Always has fast heart rate   Neuro/Psych PSYCHIATRIC DISORDERS Anxiety Depression Severe spinal stenosis, and back surgery pending negative neurological ROS     GI/Hepatic negative GI ROS, Neg liver ROS, GERD-  Medicated,(+)     substance abuse  alcohol use, Hepatitis -, A  Endo/Other  negative endocrine ROSHypothyroidism   Renal/GU Renal disease  negative genitourinary   Musculoskeletal negative musculoskeletal ROS (+)   Abdominal   Peds negative pediatric ROS (+)  Hematology negative hematology ROS (+)   Anesthesia Other Findings   Reproductive/Obstetrics negative OB ROS                         Anesthesia Physical Anesthesia Plan  ASA: III  Anesthesia Plan: General   Post-op Pain Management:    Induction: Intravenous  Airway Management Planned: Oral ETT  Additional Equipment:   Intra-op Plan:   Post-operative Plan: Extubation in OR  Informed Consent:   Plan Discussed with: Surgeon  Anesthesia Plan Comments:        Anesthesia Quick Evaluation

## 2014-02-07 NOTE — Op Note (Signed)
NAME:  MONTREAL, STEIDLE NO.:  [id number]  MEDICAL RECORD NO.:  [id number]  LOCATION:  72                         FACILITY:  [facility]  PHYSICIAN:  Kipp Brood. Anab Vivar, M.D.DATE OF BIRTH:  20-Oct-1950  DATE OF PROCEDURE:  02/07/2014 DATE OF DISCHARGE:                              OPERATIVE REPORT   SURGEON:  Kipp Brood. Gladstone Lighter, M.D.  OPERATIVE ASSISTANT:  Ardeen Jourdain, P.A.  PREOPERATIVE DIAGNOSES:  Severe degenerative arthritis with bone on bone, flexion contracture of the right knee.  POSTOPERATIVE DIAGNOSES:  Severe degenerative arthritis with bone on bone, flexion contracture of the right knee.  OPERATION:  Right total knee arthroplasty utilizing DePuy system.  I cemented all 3 components and gentamicin was used in the cement.  The sizes used 1st was size 4 right femoral component posterior cruciate sacrificing type.  The tray was a size 4 tray.  The insert was a size 4, 10-mm thickness rotating platform polyethylene insert.  The patella size 41 with 3 pegs.  DESCRIPTION OF PROCEDURE:  Under general anesthesia, routine orthopedic prep and draping, the right lower extremity was carried out.  The appropriate time-out was first carried out.  I also marked the appropriate right leg in the holding area.  At this time, the leg was exsanguinated with an Esmarch.  Tourniquet was elevated at 325 mmHg. The patient had 2 g of IV Ancef.  With the leg in a DeMayo knee holder, I then flexed the knee and did an anterior approach of the knee and 2 flaps were created.  At this time, median parapatellar approach was carried out.  I then reflected patella laterally.  With the knee flexed, I did medial and lateral meniscectomies and excised the anterior- posterior cruciate ligaments as well.  Initial drill hole then was made in the intercondylar notch just above the intercondylar notch after I did a synovectomy.  I then removed 30 mm thickness off the distal femur because of  the contracture.  At this time, I then measured the femur to be a size 4 right.  I then cut the anterior, posterior, and chamfering cuts of the distal femur for a size 4.  I then prepared the tibia in the usual fashion.  We utilized a 4 tray.  A drill hole was made in the tibial plateau.  The guide rod was inserted down after we inserted our canal finder.  Both times we inserted the canal finer for the femoral opening into the tibial opening and we thoroughly irrigated the canals out.  I then removed 6 mm thickness off the affected medial side. Following that, I then inserted my spacer blocks after we inserted our lamina spreaders to make sure we had no posterior spurs.  We had good stability with a size 10 spacer blocks in both flexion and extension.  I then went on and continued to prepare the tibia plateau in the usual fashion.  The keel cuts were made.  I then cut the distal notch, femoral notch cut out.  I then inserted trial components, reduced the knee.  We had a very nice flexion and extension and medial and lateral stability with a 10 tray.  I then did a resurfacing procedure on the patella in the usual fashion.  I then measured the patella be a size 41.  Three drill holes were made in the patella.  After that, all trial components were removed.  We thoroughly water picked out the knee, dried the knee out, cemented all 3 components in simultaneously with gentamicin in the cement.  We then removed all loose pieces of cement.  I then water picked the knee to make sure that we had no other loose pieces of cement.  I then removed my trial insert and then injected 20 mL of 0.5% Marcaine plain into the soft tissue structures.  I then inserted the permanent insert was a 4 mm Trident 10 mm thickness size 4 polyethylene rotating platform, reduced the knee and had excellent function.  We then searched for cement again. There were no other loose pieces of cement.  We inserted a  Hemovac drain, closed the knee in layers in usual fashion.  I injected 20 mL mixture of Exparel and saline.  Remaining part of the wound was closed in usual fashion.  Subcuticular sutures and sterile dressings were applied.          ______________________________ Kipp Brood Gladstone Lighter, M.D.     RAG/MEDQ  D:  02/07/2014  T:  02/07/2014  Job:  [id number]

## 2014-02-07 NOTE — Transfer of Care (Signed)
Immediate Anesthesia Transfer of Care Note  Patient: Juan Wiggins  Procedure(s) Performed: Procedure(s): RIGHT TOTAL KNEE ARTHROPLASTY (Right)  Patient Location: PACU  Anesthesia Type:General  Level of Consciousness: awake, alert  and oriented  Airway & Oxygen Therapy: Patient Spontanous Breathing and Patient connected to face mask oxygen  Post-op Assessment: Report given to PACU RN and Post -op Vital signs reviewed and stable  Post vital signs: Reviewed and stable  Complications: No apparent anesthesia complications

## 2014-02-07 NOTE — Interval H&P Note (Signed)
History and Physical Interval Note:  02/07/2014 10:47 AM  Juan Wiggins  has presented today for surgery, with the diagnosis of OA OF RIGHT KNEE  The various methods of treatment have been discussed with the patient and family. After consideration of risks, benefits and other options for treatment, the patient has consented to  Procedure(s): RIGHT TOTAL KNEE ARTHROPLASTY (Right) as a surgical intervention .  The patient's history has been reviewed, patient examined, no change in status, stable for surgery.  I have reviewed the patient's chart and labs.  Questions were answered to the patient's satisfaction.     Embrie Mikkelsen A

## 2014-02-08 ENCOUNTER — Encounter (HOSPITAL_COMMUNITY): Payer: Self-pay | Admitting: Orthopedic Surgery

## 2014-02-08 LAB — BASIC METABOLIC PANEL
ANION GAP: 7 (ref 5–15)
BUN: 14 mg/dL (ref 6–23)
CHLORIDE: 99 meq/L (ref 96–112)
CO2: 29 mEq/L (ref 19–32)
Calcium: 8.5 mg/dL (ref 8.4–10.5)
Creatinine, Ser: 1.21 mg/dL (ref 0.50–1.35)
GFR calc non Af Amer: 63 mL/min — ABNORMAL LOW (ref >90)
GFR, EST AFRICAN AMERICAN: 73 mL/min — AB (ref >90)
Glucose, Bld: 128 mg/dL — ABNORMAL HIGH (ref 70–99)
POTASSIUM: 4.3 meq/L (ref 3.7–5.3)
Sodium: 135 mEq/L — ABNORMAL LOW (ref 137–147)

## 2014-02-08 LAB — CBC
HCT: 33.3 % — ABNORMAL LOW (ref 39.0–52.0)
Hemoglobin: 11.2 g/dL — ABNORMAL LOW (ref 13.0–17.0)
MCH: 34.6 pg — ABNORMAL HIGH (ref 26.0–34.0)
MCHC: 33.6 g/dL (ref 30.0–36.0)
MCV: 102.8 fL — ABNORMAL HIGH (ref 78.0–100.0)
PLATELETS: 233 10*3/uL (ref 150–400)
RBC: 3.24 MIL/uL — ABNORMAL LOW (ref 4.22–5.81)
RDW: 13.4 % (ref 11.5–15.5)
WBC: 7.2 10*3/uL (ref 4.0–10.5)

## 2014-02-08 NOTE — Progress Notes (Signed)
Utilization review completed.  

## 2014-02-08 NOTE — Progress Notes (Signed)
Pt / sister have chosen [facility] for ST Rehab. SNF will have an opening on Friday if pt is ready for d/c. CSW will assist with d/c planning to SNF.  Werner Lean LCSW [phone]

## 2014-02-08 NOTE — Progress Notes (Signed)
CARE MANAGEMENT NOTE 02/08/2014  Patient:  Juan Wiggins, Juan Wiggins   Account Number:  [id number]  Date Initiated:  02/08/2014  Documentation initiated by:  Dessa Phi  Subjective/Objective Assessment:   63 y/o m admitted w/degenerative arthritis of r knee.     Action/Plan:   From home.   Anticipated DC Date:  02/09/2014   Anticipated DC Plan:  [place]  CM consult      Choice offered to / List presented to:             Status of service:  In process, will continue to follow Medicare Important Message given?   (If response is "NO", the following Medicare IM given date fields will be blank) Date Medicare IM given:   Medicare IM given by:   Date Additional Medicare IM given:   Additional Medicare IM given by:    Discharge Disposition:    Per UR Regulation:  Reviewed for med. necessity/level of care/duration of stay  If discussed at [place] of Stay Meetings, dates discussed:    Comments:  02/08/14 Dessa Phi RN BSN NCM [phone] pod#1 [email or web address] already following.

## 2014-02-08 NOTE — Discharge Instructions (Addendum)
Walk with your walker. Weight bearing as tolerated Do not remove aquacel dressing unless it appears compromised. Change drain site dressing daily. Shower only, no tub bath. Do not resume vitamins or supplements while on Xarelto Call if any temperatures greater than 101 or any wound complications: [phone] during the day and ask for Dr. Charlestine Night nurse, Brunilda Payor  Information on my medicine - XARELTO (Rivaroxaban)  This medication education was reviewed with me or my healthcare representative as part of my discharge preparation.  The pharmacist that spoke with me during my hospital stay was:  Clovis Riley, [facility]  Why was Xarelto prescribed for you? Xarelto was prescribed for you to reduce the risk of blood clots forming after orthopedic surgery. The medical term for these abnormal blood clots is venous thromboembolism (VTE).  What do you need to know about xarelto ? Take your Xarelto ONCE DAILY at the same time every day. You may take it either with or without food.  If you have difficulty swallowing the tablet whole, you may crush it and mix in applesauce just prior to taking your dose.  Take Xarelto exactly as prescribed by your doctor and DO NOT stop taking Xarelto without talking to the doctor who prescribed the medication.  Stopping without other VTE prevention medication to take the place of Xarelto may increase your risk of developing a clot.  After discharge, you should have regular check-up appointments with your healthcare provider that is prescribing your Xarelto.    What do you do if you miss a dose? If you miss a dose, take it as soon as you remember on the same day then continue your regularly scheduled once daily regimen the next day. Do not take two doses of Xarelto on the same day.   Important Safety Information A possible side effect of Xarelto is bleeding. You should call your healthcare provider right away if you experience any of the  following: ? Bleeding from an injury or your nose that does not stop. ? Unusual colored urine (red or dark brown) or unusual colored stools (red or black). ? Unusual bruising for unknown reasons. ? A serious fall or if you hit your head (even if there is no bleeding).  Some medicines may interact with Xarelto and might increase your risk of bleeding while on Xarelto. To help avoid this, consult your healthcare provider or pharmacist prior to using any new prescription or non-prescription medications, including herbals, vitamins, non-steroidal anti-inflammatory drugs (NSAIDs) and supplements.  This website has more information on Xarelto: [email or web address]-benson.[email or web address]

## 2014-02-08 NOTE — Progress Notes (Signed)
While assisting patient to the bathroom, RN found a bottle of patient's own Oxycodone HCL 30 mg tablets under his pillow. Pt says, "I can't have another night like last night where I hardly slept. I just have them to help me sleep." Pt denies taking any. RN informed patient that he cannot have narcotics in his room and cannot take anything without our knowledge. Advised patient that pharmacy would have to hold the medication for him until discharge. Pt reluctantly agreed and understands it is for his safety. RN and patient counted tablets at 84.5 tabs (including 3 half tabs) which were taken to pharmacy and recounted before being locked in home med drawer.

## 2014-02-08 NOTE — Progress Notes (Signed)
Subjective: 1 Day Post-Op Procedure(s) (LRB): RIGHT TOTAL KNEE ARTHROPLASTY (Right) Patient reports pain as 3 on 0-10 scale. Case discussed with Education officer, museum and will try to place him in SNF at his request.  Objective: Vital signs in last 24 hours: Temp:  [97.2 F (36.2 C)-99.1 F (37.3 C)] 98.7 F (37.1 C) (12/17 0558) Pulse Rate:  [59-97] 80 (12/17 0558) Resp:  [7-20] 19 (12/17 0558) BP: (103-155)/(51-97) 154/80 mmHg (12/17 0558) SpO2:  [94 %-99 %] 98 % (12/17 0558) Weight:  [103.193 kg (227 lb 8 oz)] 103.193 kg (227 lb 8 oz) (12/16 0948)  Intake/Output from previous day: 12/16 0701 - 12/17 0700 In: 4441.7 [I.V.:4391.7; IV Piggyback:50] Out: 3762 [Urine:2900; Drains:230; Stool:1] Intake/Output this shift:     Recent Labs  02/08/14 0423  HGB 11.2*    Recent Labs  02/08/14 0423  WBC 7.2  RBC 3.24*  HCT 33.3*  PLT 233    Recent Labs  02/08/14 0423  NA 135*  K 4.3  CL 99  CO2 29  BUN 14  CREATININE 1.21  GLUCOSE 128*  CALCIUM 8.5   No results for input(s): LABPT, INR in the last 72 hours.  Dorsiflexion/Plantar flexion intact  Assessment/Plan: 1 Day Post-Op Procedure(s) (LRB): RIGHT TOTAL KNEE ARTHROPLASTY (Right) Up with therapy discontinued plans for Friday.  Jalan Fariss A 02/08/2014, 7:31 AM

## 2014-02-08 NOTE — Progress Notes (Signed)
Clinical Social Work Department CLINICAL SOCIAL WORK PLACEMENT NOTE 02/08/2014  Patient:  Juan Wiggins, Juan Wiggins  Account Number:  [id number] Admit date:  02/07/2014  Clinical Social Worker:  Werner Lean, LCSW  Date/time:  02/08/2014 01:00 PM  Clinical Social Work is seeking post-discharge placement for this patient at the following level of care:   [facility]   (*CSW will update this form in Epic as items are completed)   02/08/2014  Patient/family provided with [place] Department of Clinical Social Work's list of facilities offering this level of care within the geographic area requested by the patient (or if unable, by the patient's family).  02/08/2014  Patient/family informed of their freedom to choose among providers that offer the needed level of care, that participate in Medicare, Medicaid or managed care program needed by the patient, have an available bed and are willing to accept the patient.    Patient/family informed of [facility]' ownership interest in [facility], as well as of the fact that they are under no obligation to receive care at this facility.  PASARR submitted to EDS on  PASARR number received on 08/24/2013  FL2 transmitted to all facilities in geographic area requested by pt/family on  02/08/2014 FL2 transmitted to all facilities within larger geographic area on   Patient informed that his/her managed care company has contracts with or will negotiate with  certain facilities, including the following:     Patient/family informed of bed offers received:  02/08/2014 Patient chooses bed at  Physician recommends and patient chooses bed at    Patient to be transferred to  on   Patient to be transferred to facility by  Patient and family notified of transfer on  Name of family member notified:    The following physician request were entered in Epic:   Additional Comments:  Werner Lean LCSW [phone]

## 2014-02-08 NOTE — Evaluation (Signed)
Physical Therapy Evaluation Patient Details Name: Juan Wiggins MRN: [id number] DOB: Sep 11, 1950 Today's Date: 02/08/2014   History of Present Illness  R TKR; s/p L TKR s/p 6 months  Clinical Impression  Pt s/p R TKR presents with decreased R LE strength/ROM and post op pain limiting functional mobility.  Pt would benefit from rehab at SNF level to maximize IND and safety prior to return home with ltd assist.    Follow Up Recommendations SNF    Equipment Recommendations  None recommended by PT    Recommendations for Other Services OT consult     Precautions / Restrictions Precautions Precautions: Fall;Knee Required Braces or Orthoses: Knee Immobilizer - Right Knee Immobilizer - Right: Discontinue once straight leg raise with < 10 degree lag Restrictions Weight Bearing Restrictions: No Other Position/Activity Restrictions: WBAT      Mobility  Bed Mobility Overal bed mobility: Needs Assistance Bed Mobility: Supine to Sit     Supine to sit: Min assist;Mod assist     General bed mobility comments: cues for sequence and use of L LE to self assist  Transfers Overall transfer level: Needs assistance Equipment used: Rolling walker (2 wheeled) Transfers: Sit to/from Stand Sit to Stand: Min assist;From elevated surface         General transfer comment: cues for LE management and use of UEs to self assist  Ambulation/Gait Ambulation/Gait assistance: Min assist Ambulation Distance (Feet): 56 Feet Assistive device: Rolling walker (2 wheeled) Gait Pattern/deviations: Shuffle;Trunk flexed;Step-to pattern;Step-through pattern Gait velocity: decr   General Gait Details: Cues for posture, sequence and position from [employer]            Wheelchair Mobility    Modified Rankin (Stroke Patients Only)       Balance                                             Pertinent Vitals/Pain Pain Assessment: 0-10 Pain Score: 2  Pain Location: R  knee Pain Descriptors / Indicators: Aching;Sore Pain Intervention(s): Limited activity within patient's tolerance;Premedicated before session;Monitored during session;Ice applied    Home Living Family/patient expects to be discharged to:: Skilled nursing facility                      Prior Function Level of Independence: Independent               Hand Dominance   Dominant Hand: Right    Extremity/Trunk Assessment   Upper Extremity Assessment: Overall WFL for tasks assessed           Lower Extremity Assessment: RLE deficits/detail RLE Deficits / Details: 2/5 quads with AAROM at knee -10 - 50    Cervical / Trunk Assessment: Normal  Communication   Communication: No difficulties  Cognition Arousal/Alertness: Awake/alert Behavior During Therapy: WFL for tasks assessed/performed Overall Cognitive Status: Within Functional Limits for tasks assessed                      General Comments      Exercises Total Joint Exercises Ankle Circles/Pumps: AROM;Both;15 reps;Supine Quad Sets: AROM;Both;10 reps;Supine Heel Slides: AAROM;15 reps;Supine;Right Straight Leg Raises: AAROM;Right;10 reps;Supine      Assessment/Plan    PT Assessment Patient needs continued PT services  PT Diagnosis Difficulty walking   PT Problem List Decreased strength;Decreased range of motion;Decreased activity tolerance;Decreased  mobility;Decreased knowledge of use of DME;Pain  PT Treatment Interventions DME instruction;Gait training;Stair training;Therapeutic activities;Functional mobility training;Therapeutic exercise;Patient/family education   PT Goals (Current goals can be found in the Care Plan section) Acute Rehab PT Goals Patient Stated Goal: Resume previous lifestyle with decreased pain PT Goal Formulation: With patient Time For Goal Achievement: 02/15/14 Potential to Achieve Goals: Good    Frequency 7X/week   Barriers to discharge        Co-evaluation                End of Session Equipment Utilized During Treatment: Gait belt;Right knee immobilizer Activity Tolerance: Patient tolerated treatment well Patient left: in chair;with call bell/phone within reach Nurse Communication: Mobility status         Time: 1030-1104 PT Time Calculation (min) (ACUTE ONLY): 34 min   Charges:   PT Evaluation $Initial PT Evaluation Tier I: 1 Procedure PT Treatments $Gait Training: 8-22 mins $Therapeutic Exercise: 8-22 mins   PT G Codes:          Steve Youngberg 02/08/2014, 12:41 PM

## 2014-02-08 NOTE — Progress Notes (Signed)
Physical Therapy Treatment Patient Details Name: Juan Wiggins MRN: [id number] DOB: 01-22-1951 Today's Date: 02/08/2014    History of Present Illness R TKR; s/p L TKR s/p 6 months    PT Comments    Progressing well with mobility.  Follow Up Recommendations  SNF     Equipment Recommendations  None recommended by PT    Recommendations for Other Services OT consult     Precautions / Restrictions Precautions Precautions: Fall;Knee Required Braces or Orthoses: Knee Immobilizer - Right Knee Immobilizer - Right: Discontinue once straight leg raise with < 10 degree lag Restrictions Weight Bearing Restrictions: No Other Position/Activity Restrictions: WBAT    Mobility  Bed Mobility Overal bed mobility: Needs Assistance Bed Mobility: Supine to Sit;Sit to Supine     Supine to sit: Min assist Sit to supine: Min assist   General bed mobility comments: cues for sequence and use of L LE to self assist  Transfers Overall transfer level: Needs assistance Equipment used: Rolling walker (2 wheeled) Transfers: Sit to/from Stand Sit to Stand: Min guard;From elevated surface         General transfer comment: cues for LE management and use of UEs to self assist  Ambulation/Gait Ambulation/Gait assistance: Min assist Ambulation Distance (Feet): 75 Feet (and 15' twice to/from bathroom) Assistive device: Rolling walker (2 wheeled) Gait Pattern/deviations: Step-to pattern;Step-through pattern;Shuffle     General Gait Details: Cues for posture, sequence and position from RW   Stairs            Wheelchair Mobility    Modified Rankin (Stroke Patients Only)       Balance                                    Cognition Arousal/Alertness: Awake/alert Behavior During Therapy: WFL for tasks assessed/performed Overall Cognitive Status: Within Functional Limits for tasks assessed                      Exercises Total Joint Exercises Ankle  Circles/Pumps: AROM;Both;15 reps;Supine Quad Sets: AROM;Both;10 reps;Supine Heel Slides: AAROM;15 reps;Supine;Right Straight Leg Raises: AAROM;Right;10 reps;Supine    General Comments        Pertinent Vitals/Pain Pain Assessment: 0-10 Pain Score: 3  Pain Location: R knee Pain Descriptors / Indicators: Aching;Sore Pain Intervention(s): Limited activity within patient's tolerance;Monitored during session;Premedicated before session;Ice applied    Home Living                      Prior Function            PT Goals (current goals can now be found in the care plan section) Acute Rehab PT Goals Patient Stated Goal: Resume previous lifestyle with decreased pain PT Goal Formulation: With patient Time For Goal Achievement: 02/15/14 Potential to Achieve Goals: Good Progress towards PT goals: Progressing toward goals    Frequency  7X/week    PT Plan Current plan remains appropriate    Co-evaluation             End of Session Equipment Utilized During Treatment: Gait belt;Right knee immobilizer Activity Tolerance: Patient tolerated treatment well Patient left: in bed;with call bell/phone within reach     Time: 1420-1447 PT Time Calculation (min) (ACUTE ONLY): 27 min  Charges:  $Gait Training: 8-22 mins $Therapeutic Exercise: 8-22 mins  G Codes:      Koren Plyler Feb 12, 2014, 5:12 PM

## 2014-02-09 LAB — BASIC METABOLIC PANEL
Anion gap: 8 (ref 5–15)
BUN: 11 mg/dL (ref 6–23)
CO2: 28 meq/L (ref 19–32)
Calcium: 8.7 mg/dL (ref 8.4–10.5)
Chloride: 103 mEq/L (ref 96–112)
Creatinine, Ser: 1.2 mg/dL (ref 0.50–1.35)
GFR calc Af Amer: 74 mL/min — ABNORMAL LOW (ref >90)
GFR, EST NON AFRICAN AMERICAN: 64 mL/min — AB (ref >90)
GLUCOSE: 115 mg/dL — AB (ref 70–99)
Potassium: 4.5 mEq/L (ref 3.7–5.3)
SODIUM: 139 meq/L (ref 137–147)

## 2014-02-09 LAB — CBC
HCT: 31.5 % — ABNORMAL LOW (ref 39.0–52.0)
HEMOGLOBIN: 10.5 g/dL — AB (ref 13.0–17.0)
MCH: 33.9 pg (ref 26.0–34.0)
MCHC: 33.3 g/dL (ref 30.0–36.0)
MCV: 101.6 fL — ABNORMAL HIGH (ref 78.0–100.0)
Platelets: 194 10*3/uL (ref 150–400)
RBC: 3.1 MIL/uL — AB (ref 4.22–5.81)
RDW: 13.3 % (ref 11.5–15.5)
WBC: 7.4 10*3/uL (ref 4.0–10.5)

## 2014-02-09 MED ORDER — DOCUSATE SODIUM 100 MG PO CAPS: 100.0000 mg | ORAL_CAPSULE | Freq: Two times a day (BID) | ORAL | Status: DC

## 2014-02-09 MED ORDER — LORAZEPAM 1 MG PO TABS: 1.0000 mg | ORAL_TABLET | Freq: Four times a day (QID) | ORAL | Status: DC | PRN

## 2014-02-09 MED ORDER — METHOCARBAMOL 500 MG PO TABS: 500.0000 mg | ORAL_TABLET | Freq: Four times a day (QID) | ORAL | Status: DC | PRN

## 2014-02-09 MED ORDER — OXYCODONE-ACETAMINOPHEN 5-325 MG PO TABS: 1.0000 | ORAL_TABLET | ORAL | Status: DC | PRN

## 2014-02-09 MED ORDER — RIVAROXABAN 10 MG PO TABS: 10.0000 mg | ORAL_TABLET | Freq: Every day | ORAL | Status: DC

## 2014-02-09 NOTE — Progress Notes (Signed)
Clinical Social Work Department CLINICAL SOCIAL WORK PLACEMENT NOTE 02/09/2014  Patient:  Juan Wiggins, Juan Wiggins  Account Number:  [id number] Admit date:  02/07/2014  Clinical Social Worker:  Werner Lean, LCSW  Date/time:  02/08/2014 01:00 PM  Clinical Social Work is seeking post-discharge placement for this patient at the following level of care:   [place]   (*CSW will update this form in Epic as items are completed)   02/08/2014  Patient/family provided with [place] Department of Clinical Social Work's list of facilities offering this level of care within the geographic area requested by the patient (or if unable, by the patient's family).  02/08/2014  Patient/family informed of their freedom to choose among providers that offer the needed level of care, that participate in Medicare, Medicaid or managed care program needed by the patient, have an available bed and are willing to accept the patient.    Patient/family informed of [facility]' ownership interest in [facility], as well as of the fact that they are under no obligation to receive care at this facility.  PASARR submitted to EDS on  PASARR number received on 08/24/2013  FL2 transmitted to all facilities in geographic area requested by pt/family on  02/08/2014 FL2 transmitted to all facilities within larger geographic area on   Patient informed that his/her managed care company has contracts with or will negotiate with  certain facilities, including the following:     Patient/family informed of bed offers received:  02/08/2014 Patient chooses bed at [place] Physician recommends and patient chooses bed at    Patient to be transferred to [place] on  02/09/2014 Patient to be transferred to facility by [place] Patient and family notified of transfer on 02/09/2014 Name of family member notified:  SISTER  The following physician request were entered in  Epic:   Additional Comments: Nsg has reviewed d/c summary, scripts, avs. Scripts included in d/c packet. D/c packet provided to pt.  Werner Lean LCSW [phone]

## 2014-02-09 NOTE — Progress Notes (Signed)
Physical Therapy Treatment Patient Details Name: Juan Wiggins MRN: [id number] DOB: 05-Mar-1950 Today's Date: 02/09/2014    History of Present Illness R TKR; s/p L TKR s/p 6 months    PT Comments    Progressing well  Follow Up Recommendations  SNF     Equipment Recommendations  None recommended by PT    Recommendations for Other Services OT consult     Precautions / Restrictions Precautions Precautions: Fall;Knee Required Braces or Orthoses: Knee Immobilizer - Right Knee Immobilizer - Right: Discontinue once straight leg raise with < 10 degree lag (Pt performed IND SLR this am) Restrictions Weight Bearing Restrictions: No Other Position/Activity Restrictions: WBAT    Mobility  Bed Mobility Overal bed mobility: Needs Assistance Bed Mobility: Supine to Sit     Supine to sit: Min guard     General bed mobility comments: cues for sequence and use of L LE to self assist  Transfers Overall transfer level: Needs assistance Equipment used: Rolling walker (2 wheeled) Transfers: Sit to/from Stand Sit to Stand: Supervision         General transfer comment: cues for LE management and use of UEs to self assist  Ambulation/Gait Ambulation/Gait assistance: Min guard Ambulation Distance (Feet): 140 Feet Assistive device: Rolling walker (2 wheeled) Gait Pattern/deviations: Step-to pattern;Step-through pattern;Shuffle     General Gait Details: Cues for posture, sequence and position from RW   Stairs            Wheelchair Mobility    Modified Rankin (Stroke Patients Only)       Balance                                    Cognition Arousal/Alertness: Awake/alert Behavior During Therapy: WFL for tasks assessed/performed Overall Cognitive Status: Within Functional Limits for tasks assessed                      Exercises Total Joint Exercises Ankle Circles/Pumps: AROM;Both;15 reps;Supine Quad Sets: AROM;Both;Supine;20 reps Heel  Slides: AAROM;Supine;Right;20 reps Straight Leg Raises: Right;Supine;AAROM;AROM;20 reps    General Comments        Pertinent Vitals/Pain Pain Assessment: 0-10 Pain Score: 5  Pain Location: R knee Pain Descriptors / Indicators: Aching;Sore Pain Intervention(s): Limited activity within patient's tolerance;Monitored during session;Premedicated before session;Ice applied    Home Living                      Prior Function            PT Goals (current goals can now be found in the care plan section) Acute Rehab PT Goals Patient Stated Goal: Resume previous lifestyle with decreased pain PT Goal Formulation: With patient Time For Goal Achievement: 02/15/14 Potential to Achieve Goals: Good Progress towards PT goals: Progressing toward goals    Frequency  7X/week    PT Plan Current plan remains appropriate    Co-evaluation             End of Session Equipment Utilized During Treatment: Gait belt Activity Tolerance: Patient tolerated treatment well Patient left: in chair;with call bell/phone within reach     Time: 0830-0858 PT Time Calculation (min) (ACUTE ONLY): 28 min  Charges:  $Gait Training: 8-22 mins $Therapeutic Exercise: 8-22 mins                    G Codes:  Naia Ruff 02/09/2014, 12:19 PM

## 2014-02-09 NOTE — Progress Notes (Addendum)
CSW assisting with d/c planning. Pt will be d/c to [facility] today. PT has approved transport by car. Pt's sister will provide transportation after lunch. CSW knows pt from previous admission ( last July )and has offended assistance , in the past, with ETOH abuse. Pt has, again, declined assistance. CSW has provided positive reinforcement for choosing rehab vs going home directly from hospital. This will allow pt additional time sober while recovering from surgery.CSW has alerted Admissions Coordinator Joan Flores ) of pt's chronic ETOH abuse ( 12/17). She reassured CSW that pt's needs can be met.  Werner Lean LCSW [phone]

## 2014-02-09 NOTE — Discharge Summary (Signed)
Physician Discharge Summary   Patient ID: Juan Wiggins MRN: [id number] DOB/AGE: Jun 22, 1950 63 y.o.  Admit date: 02/07/2014 Discharge date: 02/09/2014  Primary Diagnosis: Primary osteoarthritis, right knee  Admission Diagnoses:  Past Medical History  Diagnosis Date  . Hypertension   . Thyroid disease   . Hemorrhoids   . Hypothyroidism   . Tachycardia     PT STATES HIS HEART RATE USUALLY 100 OR MORE  . Depression   . Anxiety   . GERD (gastroesophageal reflux disease)     PREVACID IF NEEDED  . Arthritis     RHEUMATOID ARTHRITIS; OA LEFT KNEE  . Cancer     MELANOMA REMOVED RT SHOULDER  . 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 Sleep apnea     CLAUSTROPHOBIC - COULD NOT TOLERATE CPAP MASK  . Heart murmur    Discharge Diagnoses:   Active Problems:   Total knee replacement status  Estimated body mass index is 31.74 kg/(m^2) as calculated from the following:   Height as of this encounter: $RemoveBeforeD'5\' 11"'cybLSEqoyEzqTm$  (1.803 m).   Weight as of this encounter: 103.193 kg (227 lb 8 oz).  Procedure:  Procedure(s) (LRB): RIGHT TOTAL KNEE ARTHROPLASTY (Right)   Consults: None  HPI: Juan Wiggins, 63 y.o. male, has a history of pain and functional disability in the right knee due to arthritis and has failed non-surgical conservative treatments for greater than 12 weeks to includeNSAID's and/or analgesics, corticosteriod injections, viscosupplementation injections, flexibility and strengthening excercises and activity modification. Onset of symptoms was gradual, starting 5 years ago with gradually worsening course since that time. The patient noted prior procedures on the knee to include arthroscopy and menisectomy on the right knee(s). Patient currently rates pain in the right knee(s) at 8 out of 10 with activity. Patient has night pain, worsening of pain with activity and weight bearing, pain  that interferes with activities of daily living, pain with passive range of motion, crepitus and joint swelling. Patient has evidence of periarticular osteophytes and joint space narrowing by imaging studies. There is no active infection.  Laboratory Data: Admission on 02/07/2014  Component Date Value Ref Range Status  . WBC 02/08/2014 7.2  4.0 - 10.5 K/uL Final  . RBC 02/08/2014 3.24* 4.22 - 5.81 MIL/uL Final  . Hemoglobin 02/08/2014 11.2* 13.0 - 17.0 g/dL Final  . HCT 02/08/2014 33.3* 39.0 - 52.0 % Final  . MCV 02/08/2014 102.8* 78.0 - 100.0 fL Final  . MCH 02/08/2014 34.6* 26.0 - 34.0 pg Final  . MCHC 02/08/2014 33.6  30.0 - 36.0 g/dL Final  . RDW 02/08/2014 13.4  11.5 - 15.5 % Final  . Platelets 02/08/2014 233  150 - 400 K/uL Final  . Sodium 02/08/2014 135* 137 - 147 mEq/L Final  . Potassium 02/08/2014 4.3  3.7 - 5.3 mEq/L Final  . Chloride 02/08/2014 99  96 - 112 mEq/L Final  . CO2 02/08/2014 29  19 - 32 mEq/L Final  . Glucose, Bld 02/08/2014 128* 70 - 99 mg/dL Final  . BUN 02/08/2014 14  6 - 23 mg/dL Final  . Creatinine, Ser 02/08/2014 1.21  0.50 - 1.35 mg/dL Final  . Calcium 02/08/2014 8.5  8.4 - 10.5 mg/dL Final  . GFR calc non Af Amer 02/08/2014 63* >90 mL/min Final  . GFR calc Af Amer 02/08/2014 73* >90  mL/min Final   Comment: (NOTE) The eGFR has been calculated using the CKD EPI equation. This calculation has not been validated in all clinical situations. eGFR's persistently <90 mL/min signify possible Chronic Kidney Disease.   . Anion gap 02/08/2014 7  5 - 15 Final  . WBC 02/09/2014 7.4  4.0 - 10.5 K/uL Final  . RBC 02/09/2014 3.10* 4.22 - 5.81 MIL/uL Final  . Hemoglobin 02/09/2014 10.5* 13.0 - 17.0 g/dL Final  . HCT 02/09/2014 31.5* 39.0 - 52.0 % Final  . MCV 02/09/2014 101.6* 78.0 - 100.0 fL Final  . MCH 02/09/2014 33.9  26.0 - 34.0 pg Final  . MCHC 02/09/2014 33.3  30.0 - 36.0 g/dL Final  . RDW 02/09/2014 13.3  11.5 - 15.5 % Final  . Platelets 02/09/2014 194   150 - 400 K/uL Final  . Sodium 02/09/2014 139  137 - 147 mEq/L Final  . Potassium 02/09/2014 4.5  3.7 - 5.3 mEq/L Final  . Chloride 02/09/2014 103  96 - 112 mEq/L Final  . CO2 02/09/2014 28  19 - 32 mEq/L Final  . Glucose, Bld 02/09/2014 115* 70 - 99 mg/dL Final  . BUN 02/09/2014 11  6 - 23 mg/dL Final  . Creatinine, Ser 02/09/2014 1.20  0.50 - 1.35 mg/dL Final  . Calcium 02/09/2014 8.7  8.4 - 10.5 mg/dL Final  . GFR calc non Af Amer 02/09/2014 64* >90 mL/min Final  . GFR calc Af Amer 02/09/2014 74* >90 mL/min Final   Comment: (NOTE) The eGFR has been calculated using the CKD EPI equation. This calculation has not been validated in all clinical situations. eGFR's persistently <90 mL/min signify possible Chronic Kidney Disease.   Georgiann Hahn gap 02/09/2014 8  5 - 15 Final  Hospital Outpatient Visit on 01/29/2014  Component Date Value Ref Range Status  . MRSA, PCR 01/29/2014 NEGATIVE  NEGATIVE Final  . Staphylococcus aureus 01/29/2014 POSITIVE* NEGATIVE Final   Comment:        The Xpert SA Assay (FDA approved for NASAL specimens in patients over 24 years of age), is one component of a comprehensive surveillance program.  Test performance has been validated by [employer] for patients greater than or equal to 32 year old. It is not intended to diagnose infection nor to guide or monitor treatment.   Marland Kitchen aPTT 01/29/2014 32  24 - 37 seconds Final  . Sodium 01/29/2014 137  137 - 147 mEq/L Final  . Potassium 01/29/2014 4.6  3.7 - 5.3 mEq/L Final  . Chloride 01/29/2014 101  96 - 112 mEq/L Final  . CO2 01/29/2014 26  19 - 32 mEq/L Final  . Glucose, Bld 01/29/2014 91  70 - 99 mg/dL Final  . BUN 01/29/2014 14  6 - 23 mg/dL Final  . Creatinine, Ser 01/29/2014 1.20  0.50 - 1.35 mg/dL Final  . Calcium 01/29/2014 9.0  8.4 - 10.5 mg/dL Final  . Total Protein 01/29/2014 7.0  6.0 - 8.3 g/dL Final  . Albumin 01/29/2014 3.3* 3.5 - 5.2 g/dL Final  . AST 01/29/2014 77* 0 - 37 U/L Final  . ALT  01/29/2014 79* 0 - 53 U/L Final  . Alkaline Phosphatase 01/29/2014 115  39 - 117 U/L Final  . Total Bilirubin 01/29/2014 0.7  0.3 - 1.2 mg/dL Final  . GFR calc non Af Amer 01/29/2014 64* >90 mL/min Final  . GFR calc Af Amer 01/29/2014 74* >90 mL/min Final   Comment: (NOTE) The eGFR has been calculated using the CKD  EPI equation. This calculation has not been validated in all clinical situations. eGFR's persistently <90 mL/min signify possible Chronic Kidney Disease.   . Anion gap 01/29/2014 10  5 - 15 Final  . Prothrombin Time 01/29/2014 14.1  11.6 - 15.2 seconds Final  . INR 01/29/2014 1.08  0.00 - 1.49 Final  . Color, Urine 01/29/2014 YELLOW  YELLOW Final  . APPearance 01/29/2014 CLEAR  CLEAR Final  . Specific Gravity, Urine 01/29/2014 1.003* 1.005 - 1.030 Final  . pH 01/29/2014 6.0  5.0 - 8.0 Final  . Glucose, UA 01/29/2014 NEGATIVE  NEGATIVE mg/dL Final  . Hgb urine dipstick 01/29/2014 NEGATIVE  NEGATIVE Final  . Bilirubin Urine 01/29/2014 NEGATIVE  NEGATIVE Final  . Ketones, ur 01/29/2014 NEGATIVE  NEGATIVE mg/dL Final  . Protein, ur 01/29/2014 NEGATIVE  NEGATIVE mg/dL Final  . Urobilinogen, UA 01/29/2014 0.2  0.0 - 1.0 mg/dL Final  . Nitrite 01/29/2014 NEGATIVE  NEGATIVE Final  . Leukocytes, UA 01/29/2014 NEGATIVE  NEGATIVE Final   MICROSCOPIC NOT DONE ON URINES WITH NEGATIVE PROTEIN, BLOOD, LEUKOCYTES, NITRITE, OR GLUCOSE <1000 mg/dL.  . ABO/RH(D) 01/29/2014 A NEG   Final  . Antibody Screen 01/29/2014 NEG   Final  . Sample Expiration 01/29/2014 02/10/2014   Final  . WBC 01/29/2014 4.8  4.0 - 10.5 K/uL Final  . RBC 01/29/2014 3.89* 4.22 - 5.81 MIL/uL Final  . Hemoglobin 01/29/2014 13.1  13.0 - 17.0 g/dL Final  . HCT 01/29/2014 38.3* 39.0 - 52.0 % Final  . MCV 01/29/2014 98.5  78.0 - 100.0 fL Final  . MCH 01/29/2014 33.7  26.0 - 34.0 pg Final  . MCHC 01/29/2014 34.2  30.0 - 36.0 g/dL Final  . RDW 01/29/2014 13.1  11.5 - 15.5 % Final  . Platelets 01/29/2014 234  150 - 400  K/uL Final     X-Rays:No results found.  EKG: Orders placed or performed during the hospital encounter of 10/20/13  . EKG 12-Lead  . EKG 12-Lead  . EKG     Hospital Course: Juan Wiggins is a 63 y.o. who was admitted to [facility]. They were brought to the operating room on 02/07/2014 and underwent Procedure(s): RIGHT TOTAL KNEE ARTHROPLASTY.  Patient tolerated the procedure well and was later transferred to the recovery room and then to the orthopaedic floor for postoperative care.  They were given PO and IV analgesics for pain control following their surgery.  They were given 24 hours of postoperative antibiotics of  Anti-infectives    Start     Dose/Rate Route Frequency Ordered Stop   02/07/14 1800  ceFAZolin (ANCEF) IVPB 1 g/50 mL premix     1 g100 mL/hr over 30 Minutes Intravenous Every 6 hours 02/07/14 1515 02/08/14 0018   02/07/14 1201  polymyxin B 500,000 Units, bacitracin 50,000 Units in sodium chloride irrigation 0.9 % 500 mL irrigation  Status:  Discontinued       As needed 02/07/14 1201 02/07/14 1339   02/07/14 0922  ceFAZolin (ANCEF) IVPB 2 g/50 mL premix     2 g100 mL/hr over 30 Minutes Intravenous On call to O.R. 02/07/14 9509 02/07/14 1119     and started on DVT prophylaxis in the form of Xarelto.   PT and OT were ordered for total joint protocol.  Discharge planning consulted to help with postop disposition and equipment needs.  Patient had a decent night on the evening of surgery. Patient was monitored closely due to risk of alcohol withdrawal. They started to  get up OOB with therapy on day one. Hemovac drain was pulled without difficulty.  Continued to work with therapy into day two.  The patient had progressed with therapy and meeting their goals. Patient was seen in rounds and was ready to go home.   Diet: Cardiac diet Activity:WBAT Follow-up:in 2 weeks Disposition - Skilled nursing facility Discharged Condition: stable   Discharge Instructions    Call  MD / Call 911    Complete by:  As directed   If you experience chest pain or shortness of breath, CALL 911 and be transported to the hospital emergency room.  If you develope a fever above 101 F, pus (white drainage) or increased drainage or redness at the wound, or calf pain, call your surgeon's office.     Constipation Prevention    Complete by:  As directed   Drink plenty of fluids.  Prune juice may be helpful.  You may use a stool softener, such as Colace (over the counter) 100 mg twice a day.  Use MiraLax (over the counter) for constipation as needed.     Diet - low sodium heart healthy    Complete by:  As directed      Discharge instructions    Complete by:  As directed   Walk with your walker. Weight bearing as tolerated Do not remove aquacel dressing unless it appears compromised. Change drain site dressing daily. Shower only, no tub bath. Do not resume vitamins or supplements while on Xarelto Call if any temperatures greater than 101 or any wound complications: [phone] during the day and ask for Dr. Charlestine Night nurse, Brunilda Payor.     Do not put a pillow under the knee. Place it under the heel.    Complete by:  As directed      Driving restrictions    Complete by:  As directed   No driving     Increase activity slowly as tolerated    Complete by:  As directed             Medication List    STOP taking these medications        ferrous sulfate 325 (65 FE) MG tablet     OLANZapine-FLUoxetine 12-50 MG per capsule  Commonly known as:  SYMBYAX     oxycodone 30 MG immediate release tablet  Commonly known as:  ROXICODONE     Oxycodone HCl 10 MG Tabs     VITAMIN B-12 PO      TAKE these medications        Lorazepam $RemoveBe'1MG'pefsrhVuh$  tablet  Commonly known as: ATIVAN  Take 1 tablet every 6 hours PRN, CIWA-AR >8 OR withdrawal symptoms     docusate sodium 100 MG capsule  Commonly known as:  COLACE  Take 1 capsule (100 mg total) by mouth 2 (two) times daily.     escitalopram 20  MG tablet  Commonly known as:  LEXAPRO  Take 20 mg by mouth every morning.     levothyroxine 200 MCG tablet  Commonly known as:  SYNTHROID, LEVOTHROID  Take 200 mcg by mouth daily before breakfast.     lisinopril 10 MG tablet  Commonly known as:  PRINIVIL,ZESTRIL  Take 10 mg by mouth every morning.     methocarbamol 500 MG tablet  Commonly known as:  ROBAXIN  Take 1 tablet (500 mg total) by mouth every 6 (six) hours as needed for muscle spasms.     OVER THE COUNTER MEDICATION  Place into both eyes  daily as needed (contacts.). Flushes eyes     oxyCODONE-acetaminophen 5-325 MG per tablet  Commonly known as:  PERCOCET/ROXICET  Take 1-2 tablets by mouth every 4 (four) hours as needed for moderate pain.     PREVACID PO  Take 1 tablet by mouth daily as needed (acid reflux).     rivaroxaban 10 MG Tabs tablet  Commonly known as:  XARELTO  Take 1 tablet (10 mg total) by mouth daily with breakfast.     zolpidem 10 MG tablet  Commonly known as:  AMBIEN  Take 10 mg by mouth at bedtime as needed for sleep.           Follow-up Information    Follow up with GIOFFRE,RONALD A, MD. Schedule an appointment as soon as possible for a visit in 2 weeks.   Specialty:  Orthopedic Surgery   Contact information:   [address] [place] [address] [phone]       Signed: Ardeen Jourdain, PA-C Orthopaedic Surgery 02/09/2014, 7:14 AM

## 2014-02-09 NOTE — Plan of Care (Signed)
Problem: Consults Goal: Diagnosis- Total Joint Replacement Outcome: Completed/Met Date Met:  02/09/14 Primary Total Knee RIGHT  Problem: Phase III Progression Outcomes Goal: Anticoagulant follow-up in place Outcome: Not Applicable Date Met:  02/09/14 Xarelto VTE, no f/u needed.     

## 2014-02-09 NOTE — Progress Notes (Signed)
Discharge summary sent to payer through [employer]  

## 2014-02-09 NOTE — Care Management Note (Signed)
    Page 1 of 1   02/09/2014     2:08:10 PM CARE MANAGEMENT NOTE 02/09/2014  Patient:  Juan Wiggins, Juan Wiggins   Account Number:  [id number]  Date Initiated:  02/08/2014  Documentation initiated by:  Dessa Phi  Subjective/Objective Assessment:   63 y/o m admitted w/degenerative arthritis of r knee.     Action/Plan:   From home.   Anticipated DC Date:  02/09/2014   Anticipated DC Plan:  [place]  CM consult      Choice offered to / List presented to:             Status of service:  Completed, signed off Medicare Important Message given?   (If response is "NO", the following Medicare IM given date fields will be blank) Date Medicare IM given:   Medicare IM given by:   Date Additional Medicare IM given:   Additional Medicare IM given by:    Discharge Disposition:  [place]  Per UR Regulation:  Reviewed for med. necessity/level of care/duration of stay  If discussed at [place] of Stay Meetings, dates discussed:    Comments:  02/09/14 Dessa Phi RN BSN NCM [phone] d/c snf.  02/08/14 Dessa Phi RN BSN NCM [phone] pod#[email or web address] already following.

## 2014-02-09 NOTE — Progress Notes (Signed)
Subjective: 2 Days Post-Op Procedure(s) (LRB): RIGHT TOTAL KNEE ARTHROPLASTY (Right) Patient reports pain as 2 on 0-10 scale. Doing very well today. Will DC to SNF   Objective: Vital signs in last 24 hours: Temp:  [98.6 F (37 C)-99.3 F (37.4 C)] 98.6 F (37 C) (12/18 0553) Pulse Rate:  [83-90] 86 (12/18 0553) Resp:  [16-20] 18 (12/18 0553) BP: (137-147)/(70-74) 147/74 mmHg (12/18 0553) SpO2:  [94 %-99 %] 98 % (12/18 0553)  Intake/Output from previous day: 12/17 0701 - 12/18 0700 In: 3041.7 [P.O.:1440; I.V.:1601.7] Out: 225 [Urine:225] Intake/Output this shift:     Recent Labs  02/08/14 0423 02/09/14 0525  HGB 11.2* 10.5*    Recent Labs  02/08/14 0423 02/09/14 0525  WBC 7.2 7.4  RBC 3.24* 3.10*  HCT 33.3* 31.5*  PLT 233 194    Recent Labs  02/08/14 0423 02/09/14 0525  NA 135* 139  K 4.3 4.5  CL 99 103  CO2 29 28  BUN 14 11  CREATININE 1.21 1.20  GLUCOSE 128* 115*  CALCIUM 8.5 8.7   No results for input(s): LABPT, INR in the last 72 hours.  Neurologically intact  Assessment/Plan: 2 Days Post-Op Procedure(s) (LRB): RIGHT TOTAL KNEE ARTHROPLASTY (Right) Discharge to SNF  Jarryn Altland A 02/09/2014, 7:15 AM

## 2014-02-09 NOTE — Progress Notes (Signed)
OT Cancellation Note  Patient Details Name: Juan Wiggins MRN: [id number] DOB: [birth year]   Cancelled Treatment:    Reason Eval/Treat Not Completed: Other (comment).  Noted plan is for SNF today.  Will defer to that venue.  Rebecca Cairns 02/09/2014, 9:44 AM  Lesle Chris, OTR/L [phone] 02/09/2014

## 2014-05-21 ENCOUNTER — Inpatient Hospital Stay (HOSPITAL_COMMUNITY)
Admission: EM | Admit: 2014-05-21 | Discharge: 2014-05-23 | DRG: 440 | Disposition: A | Discharge status: Home / Self Care | Payer: 59 | Attending: Internal Medicine | Admitting: Internal Medicine

## 2014-05-21 ENCOUNTER — Encounter (HOSPITAL_COMMUNITY): Payer: Self-pay | Admitting: Emergency Medicine

## 2014-05-21 ENCOUNTER — Emergency Department (HOSPITAL_COMMUNITY): Payer: 59

## 2014-05-21 DIAGNOSIS — F1020 Alcohol dependence, uncomplicated: Secondary | ICD-10-CM | POA: Diagnosis present

## 2014-05-21 DIAGNOSIS — F329 Major depressive disorder, single episode, unspecified: Secondary | ICD-10-CM | POA: Diagnosis present

## 2014-05-21 DIAGNOSIS — E039 Hypothyroidism, unspecified: Secondary | ICD-10-CM | POA: Diagnosis present

## 2014-05-21 DIAGNOSIS — K852 Alcohol induced acute pancreatitis without necrosis or infection: Secondary | ICD-10-CM

## 2014-05-21 DIAGNOSIS — Z8249 Family history of ischemic heart disease and other diseases of the circulatory system: Secondary | ICD-10-CM | POA: Diagnosis not present

## 2014-05-21 DIAGNOSIS — G894 Chronic pain syndrome: Secondary | ICD-10-CM | POA: Diagnosis present

## 2014-05-21 DIAGNOSIS — Z96653 Presence of artificial knee joint, bilateral: Secondary | ICD-10-CM | POA: Diagnosis present

## 2014-05-21 DIAGNOSIS — G4730 Sleep apnea, unspecified: Secondary | ICD-10-CM | POA: Diagnosis present

## 2014-05-21 DIAGNOSIS — E038 Other specified hypothyroidism: Secondary | ICD-10-CM

## 2014-05-21 DIAGNOSIS — R109 Unspecified abdominal pain: Secondary | ICD-10-CM | POA: Diagnosis not present

## 2014-05-21 DIAGNOSIS — I10 Essential (primary) hypertension: Secondary | ICD-10-CM | POA: Diagnosis present

## 2014-05-21 DIAGNOSIS — Z79899 Other long term (current) drug therapy: Secondary | ICD-10-CM | POA: Diagnosis not present

## 2014-05-21 DIAGNOSIS — B1920 Unspecified viral hepatitis C without hepatic coma: Secondary | ICD-10-CM | POA: Diagnosis present

## 2014-05-21 DIAGNOSIS — F419 Anxiety disorder, unspecified: Secondary | ICD-10-CM | POA: Diagnosis present

## 2014-05-21 DIAGNOSIS — K219 Gastro-esophageal reflux disease without esophagitis: Secondary | ICD-10-CM | POA: Diagnosis present

## 2014-05-21 DIAGNOSIS — K859 Acute pancreatitis without necrosis or infection, unspecified: Secondary | ICD-10-CM | POA: Diagnosis present

## 2014-05-21 DIAGNOSIS — M545 Low back pain: Secondary | ICD-10-CM | POA: Diagnosis present

## 2014-05-21 DIAGNOSIS — R52 Pain, unspecified: Secondary | ICD-10-CM

## 2014-05-21 DIAGNOSIS — F328 Other depressive episodes: Secondary | ICD-10-CM | POA: Diagnosis not present

## 2014-05-21 LAB — I-STAT CHEM 8, ED
BUN: 20 mg/dL (ref 6–23)
CALCIUM ION: 1.17 mmol/L (ref 1.13–1.30)
Chloride: 95 mmol/L — ABNORMAL LOW (ref 96–112)
Creatinine, Ser: 1 mg/dL (ref 0.50–1.35)
Glucose, Bld: 119 mg/dL — ABNORMAL HIGH (ref 70–99)
HCT: 45 % (ref 39.0–52.0)
Hemoglobin: 15.3 g/dL (ref 13.0–17.0)
Potassium: 4.7 mmol/L (ref 3.5–5.1)
Sodium: 130 mmol/L — ABNORMAL LOW (ref 135–145)
TCO2: 22 mmol/L (ref 0–100)

## 2014-05-21 LAB — CBC WITH DIFFERENTIAL/PLATELET
Basophils Absolute: 0 10*3/uL (ref 0.0–0.1)
Basophils Relative: 0 % (ref 0–1)
Eosinophils Absolute: 0.6 10*3/uL (ref 0.0–0.7)
Eosinophils Relative: 4 % (ref 0–5)
HEMATOCRIT: 40.4 % (ref 39.0–52.0)
HEMOGLOBIN: 13.7 g/dL (ref 13.0–17.0)
LYMPHS PCT: 6 % — AB (ref 12–46)
Lymphs Abs: 0.9 10*3/uL (ref 0.7–4.0)
MCH: 34.5 pg — ABNORMAL HIGH (ref 26.0–34.0)
MCHC: 33.9 g/dL (ref 30.0–36.0)
MCV: 101.8 fL — ABNORMAL HIGH (ref 78.0–100.0)
MONO ABS: 1.2 10*3/uL — AB (ref 0.1–1.0)
MONOS PCT: 7 % (ref 3–12)
NEUTROS ABS: 13.5 10*3/uL — AB (ref 1.7–7.7)
NEUTROS PCT: 83 % — AB (ref 43–77)
Platelets: 330 10*3/uL (ref 150–400)
RBC: 3.97 MIL/uL — ABNORMAL LOW (ref 4.22–5.81)
RDW: 14.5 % (ref 11.5–15.5)
WBC: 16.3 10*3/uL — AB (ref 4.0–10.5)

## 2014-05-21 LAB — COMPREHENSIVE METABOLIC PANEL
ALBUMIN: 3.4 g/dL — AB (ref 3.5–5.2)
ALK PHOS: 93 U/L (ref 39–117)
ALT: 47 U/L (ref 0–53)
AST: 45 U/L — AB (ref 0–37)
Anion gap: 8 (ref 5–15)
BILIRUBIN TOTAL: 1 mg/dL (ref 0.3–1.2)
BUN: 19 mg/dL (ref 6–23)
CHLORIDE: 96 mmol/L (ref 96–112)
CO2: 27 mmol/L (ref 19–32)
Calcium: 8.9 mg/dL (ref 8.4–10.5)
Creatinine, Ser: 1.18 mg/dL (ref 0.50–1.35)
GFR calc Af Amer: 75 mL/min — ABNORMAL LOW (ref >90)
GFR calc non Af Amer: 65 mL/min — ABNORMAL LOW (ref >90)
Glucose, Bld: 121 mg/dL — ABNORMAL HIGH (ref 70–99)
Potassium: 5.2 mmol/L — ABNORMAL HIGH (ref 3.5–5.1)
Sodium: 131 mmol/L — ABNORMAL LOW (ref 135–145)
Total Protein: 7.4 g/dL (ref 6.0–8.3)

## 2014-05-21 LAB — LIPASE, BLOOD: Lipase: 220 U/L — ABNORMAL HIGH (ref 11–59)

## 2014-05-21 LAB — URINALYSIS, ROUTINE W REFLEX MICROSCOPIC
Bilirubin Urine: NEGATIVE
Glucose, UA: NEGATIVE mg/dL
Hgb urine dipstick: NEGATIVE
KETONES UR: NEGATIVE mg/dL
LEUKOCYTES UA: NEGATIVE
NITRITE: NEGATIVE
PH: 6 (ref 5.0–8.0)
PROTEIN: NEGATIVE mg/dL
Specific Gravity, Urine: 1.028 (ref 1.005–1.030)
Urobilinogen, UA: 1 mg/dL (ref 0.0–1.0)

## 2014-05-21 MED ORDER — HYDROMORPHONE HCL 1 MG/ML IJ SOLN
1.0000 mg | Freq: Once | INTRAMUSCULAR | Status: AC
  Administered 2014-05-21: 1 mg via INTRAVENOUS
  Filled 2014-05-21: qty 1

## 2014-05-21 MED ORDER — LORAZEPAM 2 MG/ML IJ SOLN
0.0000 mg | Freq: Four times a day (QID) | INTRAMUSCULAR | Status: DC
  Administered 2014-05-22: 2 mg via INTRAVENOUS
  Filled 2014-05-21 (×2): qty 1

## 2014-05-21 MED ORDER — ONDANSETRON HCL 4 MG/2ML IJ SOLN: 4.0000 mg | Freq: Four times a day (QID) | INTRAMUSCULAR | Status: DC | PRN

## 2014-05-21 MED ORDER — LEVOTHYROXINE SODIUM 175 MCG PO TABS
175.0000 ug | ORAL_TABLET | Freq: Every day | ORAL | Status: DC
  Administered 2014-05-22 – 2014-05-23 (×2): 175 ug via ORAL
  Filled 2014-05-21 (×3): qty 1

## 2014-05-21 MED ORDER — IOHEXOL 300 MG/ML  SOLN
100.0000 mL | Freq: Once | INTRAMUSCULAR | Status: AC | PRN
  Administered 2014-05-21: 100 mL via INTRAVENOUS

## 2014-05-21 MED ORDER — ONDANSETRON HCL 4 MG PO TABS
4.0000 mg | ORAL_TABLET | Freq: Four times a day (QID) | ORAL | Status: DC | PRN
Start: 2014-05-21 — End: 2014-05-23

## 2014-05-21 MED ORDER — ACETAMINOPHEN 325 MG PO TABS: 650.0000 mg | ORAL_TABLET | Freq: Four times a day (QID) | ORAL | Status: DC | PRN

## 2014-05-21 MED ORDER — OLANZAPINE-FLUOXETINE HCL 12-50 MG PO CAPS
1.0000 | ORAL_CAPSULE | Freq: Every day | ORAL | Status: DC
  Administered 2014-05-21 – 2014-05-22 (×2): 1 via ORAL
  Filled 2014-05-21 (×3): qty 1

## 2014-05-21 MED ORDER — LORAZEPAM 2 MG/ML IJ SOLN: 0.0000 mg | Freq: Two times a day (BID) | INTRAMUSCULAR | Status: DC

## 2014-05-21 MED ORDER — LISINOPRIL 10 MG PO TABS
10.0000 mg | ORAL_TABLET | Freq: Every morning | ORAL | Status: DC
  Administered 2014-05-22 – 2014-05-23 (×2): 10 mg via ORAL
  Filled 2014-05-21 (×2): qty 1

## 2014-05-21 MED ORDER — ALUM & MAG HYDROXIDE-SIMETH 200-200-20 MG/5ML PO SUSP: 30.0000 mL | Freq: Four times a day (QID) | ORAL | Status: DC | PRN

## 2014-05-21 MED ORDER — IOHEXOL 300 MG/ML  SOLN
50.0000 mL | Freq: Once | INTRAMUSCULAR | Status: AC | PRN
  Administered 2014-05-21: 50 mL via ORAL

## 2014-05-21 MED ORDER — LORAZEPAM 2 MG/ML IJ SOLN
1.0000 mg | Freq: Four times a day (QID) | INTRAMUSCULAR | Status: DC | PRN
  Administered 2014-05-21 – 2014-05-22 (×2): 1 mg via INTRAVENOUS
  Filled 2014-05-21: qty 1

## 2014-05-21 MED ORDER — THIAMINE HCL 100 MG/ML IJ SOLN
100.0000 mg | Freq: Every day | INTRAMUSCULAR | Status: DC
  Filled 2014-05-21 (×2): qty 1

## 2014-05-21 MED ORDER — HYDROMORPHONE HCL 1 MG/ML IJ SOLN
1.0000 mg | INTRAMUSCULAR | Status: DC | PRN
  Administered 2014-05-21 – 2014-05-23 (×8): 1 mg via INTRAVENOUS
  Filled 2014-05-21 (×8): qty 1

## 2014-05-21 MED ORDER — ALPRAZOLAM 0.25 MG PO TABS
0.2500 mg | ORAL_TABLET | Freq: Every morning | ORAL | Status: DC
  Administered 2014-05-22 – 2014-05-23 (×2): 0.25 mg via ORAL
  Filled 2014-05-21 (×2): qty 1

## 2014-05-21 MED ORDER — ADULT MULTIVITAMIN W/MINERALS CH
1.0000 | ORAL_TABLET | Freq: Every day | ORAL | Status: DC
  Administered 2014-05-22 – 2014-05-23 (×2): 1 via ORAL
  Filled 2014-05-21 (×2): qty 1

## 2014-05-21 MED ORDER — LORAZEPAM 1 MG PO TABS: 1.0000 mg | ORAL_TABLET | Freq: Four times a day (QID) | ORAL | Status: DC | PRN

## 2014-05-21 MED ORDER — FOLIC ACID 1 MG PO TABS
1.0000 mg | ORAL_TABLET | Freq: Every day | ORAL | Status: DC
  Administered 2014-05-22 – 2014-05-23 (×2): 1 mg via ORAL
  Filled 2014-05-21 (×2): qty 1

## 2014-05-21 MED ORDER — ONDANSETRON HCL 4 MG/2ML IJ SOLN
4.0000 mg | Freq: Once | INTRAMUSCULAR | Status: AC
  Administered 2014-05-21: 4 mg via INTRAVENOUS
  Filled 2014-05-21: qty 2

## 2014-05-21 MED ORDER — ZOLPIDEM TARTRATE 10 MG PO TABS: 10.0000 mg | ORAL_TABLET | Freq: Every evening | ORAL | Status: DC | PRN

## 2014-05-21 MED ORDER — OXYCODONE HCL 5 MG PO TABS
30.0000 mg | ORAL_TABLET | Freq: Every day | ORAL | Status: DC
  Administered 2014-05-21 – 2014-05-23 (×10): 30 mg via ORAL
  Filled 2014-05-21 (×11): qty 6

## 2014-05-21 MED ORDER — HEPARIN SODIUM (PORCINE) 5000 UNIT/ML IJ SOLN
5000.0000 [IU] | Freq: Three times a day (TID) | INTRAMUSCULAR | Status: DC
Start: 2014-05-21 — End: 2014-05-23
  Administered 2014-05-21 – 2014-05-23 (×5): 5000 [IU] via SUBCUTANEOUS
  Filled 2014-05-21 (×8): qty 1

## 2014-05-21 MED ORDER — ESCITALOPRAM OXALATE 20 MG PO TABS
20.0000 mg | ORAL_TABLET | Freq: Every morning | ORAL | Status: DC
  Administered 2014-05-22 – 2014-05-23 (×2): 20 mg via ORAL
  Filled 2014-05-21 (×2): qty 1

## 2014-05-21 MED ORDER — SODIUM CHLORIDE 0.9 % IV SOLN
INTRAVENOUS | Status: DC
  Administered 2014-05-21 – 2014-05-22 (×5): via INTRAVENOUS

## 2014-05-21 MED ORDER — SODIUM CHLORIDE 0.9 % IV BOLUS (SEPSIS)
1000.0000 mL | Freq: Once | INTRAVENOUS | Status: AC
  Administered 2014-05-21: 1000 mL via INTRAVENOUS

## 2014-05-21 MED ORDER — ACETAMINOPHEN 650 MG RE SUPP: 650.0000 mg | Freq: Four times a day (QID) | RECTAL | Status: DC | PRN

## 2014-05-21 MED ORDER — VITAMIN B-1 100 MG PO TABS
100.0000 mg | ORAL_TABLET | Freq: Every day | ORAL | Status: DC
  Administered 2014-05-22 – 2014-05-23 (×3): 100 mg via ORAL
  Filled 2014-05-21 (×2): qty 1

## 2014-05-21 NOTE — ED Provider Notes (Signed)
CSN: [id number]     Arrival date & time 05/21/14  1120 History   First MD Initiated Contact with Patient 05/21/14 1146     Chief Complaint  Patient presents with  . Abdominal Pain     (Consider location/radiation/quality/duration/timing/severity/associated sxs/prior Treatment) Patient is a 64 y.o. male presenting with abdominal pain. The history is provided by the patient (the pt complains of abd pain and vomiting for a couple days.  he drinks 2 alcoholic drinks a day).  Abdominal Pain Pain location:  Periumbilical Pain quality: aching   Pain radiates to:  Does not radiate Pain severity:  Moderate Onset quality:  Gradual Timing:  Constant Progression:  Unchanged Chronicity:  New Context: alcohol use   Associated symptoms: no chest pain, no cough, no diarrhea, no fatigue and no hematuria     Past Medical History  Diagnosis Date  . Hypertension   . Thyroid disease   . Hemorrhoids   . Hypothyroidism   . Tachycardia     PT STATES HIS HEART RATE USUALLY 100 OR MORE  . Depression   . Anxiety   . GERD (gastroesophageal reflux disease)     PREVACID IF NEEDED  . Arthritis     RHEUMATOID ARTHRITIS; OA LEFT KNEE  . 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 Sleep apnea     CLAUSTROPHOBIC - COULD NOT TOLERATE CPAP MASK  . Heart murmur   . Cancer     MELANOMA REMOVED RT SHOULDER   Past Surgical History  Procedure Laterality Date  . Knee surgery      BILATERAL KNEE ARTHROSCOPY  . Shoulder surgery      RIGHT ROTATOR CUFF REPAIR AND LEFT ARTHROSCOPY  . Elbow surgery      BILATERAL ELBOW SURGERY  . Refractive surgery    . Total knee arthroplasty Left 08/23/2013    Procedure: LEFT TOTAL KNEE ARTHROPLASTY;  Surgeon: Tobi Bastos, MD;  Location: [facility];  Service: Orthopedics;  Laterality: Left;  . Colonscopy     . Left tkr      July 2015  . Total knee arthroplasty  Right 02/07/2014    Procedure: RIGHT TOTAL KNEE ARTHROPLASTY;  Surgeon: Tobi Bastos, MD;  Location: [facility];  Service: Orthopedics;  Laterality: Right;   Family History  Problem Relation Age of Onset  . Hypertension Mother   . Other Mother     varicose veins  . Hypertension Sister    History  Substance Use Topics  . Smoking status: Former Smoker -- 1.50 packs/day for 35 years    Types: Cigars    Quit date: 02/22/1998  . Smokeless tobacco: Never Used     Comment: used cigarettes 20 years ago  . Alcohol Use: Yes     Comment: 6-8 oz daily of scotch as stated per pt currently.   PT STATES NOW DRINKING 3 OR 4 GLASSES SCOTCH- HAS BEEN IN REHAB FOR ALCOHOLISM FEB 2015  FORMER CIG[employer]ETTE SMOKER - QUIT 1999     Review of Systems  Constitutional: Negative for appetite change and fatigue.  HENT: Negative for congestion, ear discharge and sinus pressure.   Eyes: Negative for discharge.  Respiratory: Negative for cough.   Cardiovascular: Negative for chest pain.  Gastrointestinal: Positive for abdominal pain. Negative for diarrhea.  Genitourinary: Negative for frequency and hematuria.  Musculoskeletal: Negative  for back pain.  Skin: Negative for rash.  Neurological: Negative for seizures and headaches.  Psychiatric/Behavioral: Negative for hallucinations.      Allergies  Review of patient's allergies indicates no known allergies.  Home Medications   Prior to Admission medications   Medication Sig Start Date End Date Taking? Authorizing Provider  ALPRAZolam (XANAX) 0.25 MG tablet Take 0.25 mg by mouth every morning.   Yes Historical Provider, MD  docusate sodium (COLACE) 100 MG capsule Take 1 capsule (100 mg total) by mouth 2 (two) times daily. Patient taking differently: Take 100 mg by mouth daily as needed for mild constipation.  02/09/14  Yes Amber Cecilio Asper, PA-C  escitalopram (LEXAPRO) 20 MG tablet Take 20 mg by mouth every morning.   Yes Historical Provider, MD   levothyroxine (SYNTHROID, LEVOTHROID) 175 MCG tablet Take 175 mcg by mouth daily before breakfast.   Yes Historical Provider, MD  lisinopril (PRINIVIL,ZESTRIL) 10 MG tablet Take 10 mg by mouth every morning.   Yes Historical Provider, MD  LORazepam (ATIVAN) 1 MG tablet Take 1 tablet (1 mg total) by mouth every 6 (six) hours as needed (CIWA-AR > 8  -OR-  withdrawal symptoms:  anxiety, agitation, insomnia, diaphoresis, nausea, vomiting, tremors, tachycardia, or hypertension.). 02/09/14  Yes Amber Constable, PA-C  OLANZapine-FLUoxetine (SYMBYAX) 12-50 MG per capsule Take 1 capsule by mouth at bedtime.   Yes Historical Provider, MD  OVER THE COUNTER MEDICATION Place into both eyes daily as needed (contacts.). Flushes eyes   Yes Historical Provider, MD  oxycodone (ROXICODONE) 30 MG immediate release tablet Take 30 mg by mouth 5 (five) times daily.   Yes Historical Provider, MD  zolpidem (AMBIEN) 10 MG tablet Take 10 mg by mouth at bedtime as needed for sleep.   Yes Historical Provider, MD  methocarbamol (ROBAXIN) 500 MG tablet Take 1 tablet (500 mg total) by mouth every 6 (six) hours as needed for muscle spasms. Patient not taking: Reported on 05/21/2014 02/09/14   Ardeen Jourdain, PA-C  oxyCODONE-acetaminophen (PERCOCET/ROXICET) 5-325 MG per tablet Take 1-2 tablets by mouth every 4 (four) hours as needed for moderate pain. Patient not taking: Reported on 05/21/2014 02/09/14   Ardeen Jourdain, PA-C  rivaroxaban (XARELTO) 10 MG TABS tablet Take 1 tablet (10 mg total) by mouth daily with breakfast. Patient not taking: Reported on 05/21/2014 02/09/14   Amber Constable, PA-C   BP 151/70 mmHg  Pulse 98  Temp(Src) 98 F (36.7 C) (Oral)  Resp 18  SpO2 96% Physical Exam  Constitutional: He is oriented to person, place, and time. He appears well-developed.  HENT:  Head: Normocephalic.  Eyes: Conjunctivae and EOM are normal. No scleral icterus.  Neck: Neck supple. No thyromegaly present.  Cardiovascular:  Normal rate and regular rhythm.  Exam reveals no gallop and no friction rub.   No murmur heard. Pulmonary/Chest: No stridor. He has no wheezes. He has no rales. He exhibits no tenderness.  Abdominal: He exhibits no distension. There is tenderness. There is no rebound.  Musculoskeletal: Normal range of motion. He exhibits no edema.  Lymphadenopathy:    He has no cervical adenopathy.  Neurological: He is oriented to person, place, and time. He exhibits normal muscle tone. Coordination normal.  Skin: No rash noted. No erythema.  Psychiatric: He has a normal mood and affect. His behavior is normal.    ED Course  Procedures (including critical care time) Labs Review Labs Reviewed  COMPREHENSIVE METABOLIC PANEL - Abnormal; Notable for the following:    Sodium 131 (*)  Potassium 5.2 (*)    Glucose, Bld 121 (*)    Albumin 3.4 (*)    AST 45 (*)    GFR calc non Af Amer 65 (*)    GFR calc Af Amer 75 (*)    All other components within normal limits  CBC WITH DIFFERENTIAL/PLATELET - Abnormal; Notable for the following:    WBC 16.3 (*)    RBC 3.97 (*)    MCV 101.8 (*)    MCH 34.5 (*)    Neutrophils Relative % 83 (*)    Neutro Abs 13.5 (*)    Lymphocytes Relative 6 (*)    Monocytes Absolute 1.2 (*)    All other components within normal limits  LIPASE, BLOOD - Abnormal; Notable for the following:    Lipase 220 (*)    All other components within normal limits  I-STAT CHEM 8, ED - Abnormal; Notable for the following:    Sodium 130 (*)    Chloride 95 (*)    Glucose, Bld 119 (*)    All other components within normal limits  URINALYSIS, ROUTINE W REFLEX MICROSCOPIC    Imaging Review Ct Abdomen Pelvis W Contrast  05/21/2014   CLINICAL DATA:  Mid abdominal pain with distension. Nausea, vomiting, and diarrhea for 48 hours.  EXAM: CT ABDOMEN AND PELVIS WITH CONTRAST  TECHNIQUE: Multidetector CT imaging of the abdomen and pelvis was performed using the standard protocol following bolus  administration of intravenous contrast.  CONTRAST:  46mL OMNIPAQUE IOHEXOL 300 MG/ML SOLN, 171mL OMNIPAQUE IOHEXOL 300 MG/ML SOLN  COMPARISON:  06/24/2009  FINDINGS: BODY WALL: Asymmetric appearance of the right inguinal canal suggests remote hernia repair. There is a probable small fatty left inguinal hernia.  LOWER CHEST: Small sliding hiatal hernia.  ABDOMEN/PELVIS:  Liver: Marked fatty infiltration.  Biliary: No evidence of biliary obstruction or stone.  Pancreas: Peripancreatic edema compatible with pancreatitis. No evidence of necrosis or fluid collection. No vascular compromise.  Spleen: Unremarkable.  Adrenals: Unremarkable.  Kidneys and ureters: Left renal cysts, the largest in the lower pole at 4 cm with layering milk of calcium.  Bladder: Unremarkable.  Reproductive: No pathologic findings.  Bowel: No obstruction. Normal appendix.  Retroperitoneum: Mild retroperitoneal edema likely related to the pancreas.  Peritoneum: Small volume pelvic ascites, reactive.  Vascular: No acute abnormality.  OSSEOUS: Focally advanced L2-3 degenerative disc disease. Lower lumbar facet arthropathy, especially at L4-5 and L5-S1.  IMPRESSION: 1. Pancreatitis without fluid collection. 2. Hepatic steatosis.   Electronically Signed   By: Monte Fantasia M.D.   On: 05/21/2014 13:28     EKG Interpretation None      MDM   Final diagnoses:  Pain  Pancreatitis, alcoholic, acute    Pancreatitis,  admit    Milton Ferguson, MD 05/21/14 1439

## 2014-05-21 NOTE — ED Notes (Signed)
Pt c/o sharp abd pain all over x 3 days along with n/v/d.

## 2014-05-21 NOTE — ED Notes (Signed)
Patient states he is unable to urinate.

## 2014-05-21 NOTE — Progress Notes (Signed)
UR completed 

## 2014-05-21 NOTE — ED Notes (Signed)
Patient is still unable to urinate, he states it hurts to bad when he tries to go

## 2014-05-21 NOTE — H&P (Addendum)
[facility] History and Physical  [name] BJS:[id number] DOB: [birth year] DOA: 05/21/2014   PCP: Helane Rima, MD    Chief Complaint: abdominal pain  HPI: Juan Wiggins is a 64 y.o. male with h/o alcohol abuse, HTN, chronic pain in back and knees on high dose Narcotics by pain management, depression/ anxiety, Hep C presents with diffuse abdominal pain with vomiting for 2 days. He has not eaten or drank in 2 days. Took some sips of scotch yesterday. No vomiting today but pain is severe. He is found to have acute pancreatitis. He has cut back on his drinking about 6 wks ago (see social history).    General: The patient denies anorexia, fever, weight loss Cardiac: Denies chest pain, syncope, palpitations, pedal edema  Respiratory: Denies cough, shortness of breath, wheezing GI: Denies severe indigestion/heartburn, abdominal pain, nausea, vomiting, diarrhea and constipation GU: Denies hematuria, incontinence, dysuria  Musculoskeletal: Denies arthritis  Skin: Denies suspicious skin lesions Neurologic: Denies focal weakness or numbness, change in vision Psychiatry: Denies depression or anxiety. Hematologic: no easy bruising or bleeding   Past Medical History  Diagnosis Date  . Hypertension   . Hypothyroid   . Hemorrhoids   . Hep C - never treated   . Tachycardia     PT STATES HIS HEART RATE USUALLY 100 OR MORE  . Depression   . Anxiety   . GERD (gastroesophageal reflux disease)     PREVACID IF NEEDED  . Arthritis     RHEUMATOID ARTHRITIS; OA LEFT KNEE  . Hepatitis A in the past     PT STATES TYPE OF HEPATITIS YOU GET FROM SHELLFISH  . Lower back pain and b/l knee pain on chronic Oxycodone by pain management   . Sleep apnea     CLAUSTROPHOBIC - COULD NOT TOLERATE CPAP MASK  . Heart murmur   . Cancer     MELANOMA REMOVED RT SHOULDER    Past Surgical History  Procedure Laterality Date  . Knee surgery      BILATERAL KNEE ARTHROSCOPY  . Shoulder surgery       RIGHT ROTATOR CUFF REPAIR AND LEFT ARTHROSCOPY  . Elbow surgery      BILATERAL ELBOW SURGERY  . Refractive surgery    . Total knee arthroplasty Left 08/23/2013    Procedure: LEFT TOTAL KNEE ARTHROPLASTY;  Surgeon: Tobi Bastos, MD;  Location: [facility];  Service: Orthopedics;  Laterality: Left;  . Colonscopy     . Left tkr      July 2015  . Total knee arthroplasty Right 02/07/2014    Procedure: RIGHT TOTAL KNEE ARTHROPLASTY;  Surgeon: Tobi Bastos, MD;  Location: [facility];  Service: Orthopedics;  Laterality: Right;    Social History: had a couple of sips of scotch yesterday, prior to 2 days ago he was having scotch mixed with ice, was a heavy drinking of 6 scotches per day about 6 wks ago  Lives at home with father.     No Known Allergies  Family History  Problem Relation Age of Onset  . Hypertension Mother   . Other Mother     varicose veins  . Hypertension Sister      Prior to Admission medications   Medication Sig Start Date End Date Taking? Authorizing Provider  ALPRAZolam (XANAX) 0.25 MG tablet Take 0.25 mg by mouth every morning.   Yes Historical Provider, MD  docusate sodium (COLACE) 100 MG capsule Take 1 capsule (100 mg total) by mouth 2 (two)  times daily. Patient taking differently: Take 100 mg by mouth daily as needed for mild constipation.  02/09/14  Yes Amber Cecilio Asper, PA-C  escitalopram (LEXAPRO) 20 MG tablet Take 20 mg by mouth every morning.   Yes Historical Provider, MD  levothyroxine (SYNTHROID, LEVOTHROID) 175 MCG tablet Take 175 mcg by mouth daily before breakfast.   Yes Historical Provider, MD  lisinopril (PRINIVIL,ZESTRIL) 10 MG tablet Take 10 mg by mouth every morning.   Yes Historical Provider, MD  OLANZapine-FLUoxetine (SYMBYAX) 12-50 MG per capsule Take 1 capsule by mouth at bedtime.   Yes Historical Provider, MD  OVER THE COUNTER MEDICATION Place into both eyes daily as needed (contacts.). Flushes eyes   Yes Historical Provider, MD  oxycodone  (ROXICODONE) 30 MG immediate release tablet Take 30 mg by mouth 5 (five) times daily.   Yes Historical Provider, MD  zolpidem (AMBIEN) 10 MG tablet Take 10 mg by mouth at bedtime as needed for sleep.   Yes Historical Provider, MD     Physical Exam: Filed Vitals:   05/21/14 1127 05/21/14 1340 05/21/14 1553 05/21/14 1613  BP: 152/71 151/70 145/78 148/77  Pulse: 100 98 91 95  Temp: 98 F (36.7 C)   99.6 F (37.6 C)  TempSrc: Oral   Oral  Resp: 20 18 17 18   SpO2: 95% 96% 97% 97%     General: AAO x3 HEENT: Normocephalic and Atraumatic, Mucous membranes pink                PERRLA; EOM intact; No scleral icterus,                 Nares: Patent, Oropharynx: Clear, Fair Dentition                 Neck: FROM, no cervical lymphadenopathy, thyromegaly, carotid bruit or JVD;  Breasts: deferred CHEST WALL: No tenderness  CHEST: Normal respiration, clear to auscultation bilaterally  HEART: Regular rate and rhythm; no murmurs rubs or gallops  BACK: No kyphosis or scoliosis; no CVA tenderness  GI: Positive Bowel Sounds, soft,diffusely tender; no rebound tenderness; no masses, no organomegaly Rectal Exam: deferred MSK: No cyanosis, clubbing, or edema Genitalia: not examined  SKIN:  no rash or ulceration  CNS: Alert and Oriented x 4, Nonfocal exam, CN 2-12 intact  Labs on Admission:  Basic Metabolic Panel:  Recent Labs Lab 05/21/14 1141 05/21/14 1203  NA 131* 130*  K 5.2* 4.7  CL 96 95*  CO2 27  --   GLUCOSE 121* 119*  BUN 19 20  CREATININE 1.18 1.00  CALCIUM 8.9  --    Liver Function Tests:  Recent Labs Lab 05/21/14 1141  AST 45*  ALT 47  ALKPHOS 93  BILITOT 1.0  PROT 7.4  ALBUMIN 3.4*    Recent Labs Lab 05/21/14 1141  LIPASE 220*   No results for input(s): AMMONIA in the last 168 hours. CBC:  Recent Labs Lab 05/21/14 1141 05/21/14 1203  WBC 16.3*  --   NEUTROABS 13.5*  --   HGB 13.7 15.3  HCT 40.4 45.0  MCV 101.8*  --   PLT 330  --    Cardiac  Enzymes: No results for input(s): CKTOTAL, CKMB, CKMBINDEX, TROPONINI in the last 168 hours.  BNP (last 3 results) No results for input(s): BNP in the last 8760 hours.  ProBNP (last 3 results) No results for input(s): PROBNP in the last 8760 hours.  CBG: No results for input(s): GLUCAP in the last 168 hours.  Radiological  Exams on Admission: Ct Abdomen Pelvis W Contrast  05/21/2014   CLINICAL DATA:  Mid abdominal pain with distension. Nausea, vomiting, and diarrhea for 48 hours.  EXAM: CT ABDOMEN AND PELVIS WITH CONTRAST  TECHNIQUE: Multidetector CT imaging of the abdomen and pelvis was performed using the standard protocol following bolus administration of intravenous contrast.  CONTRAST:  14mL OMNIPAQUE IOHEXOL 300 MG/ML SOLN, 145mL OMNIPAQUE IOHEXOL 300 MG/ML SOLN  COMPARISON:  06/24/2009  FINDINGS: BODY WALL: Asymmetric appearance of the right inguinal canal suggests remote hernia repair. There is a probable small fatty left inguinal hernia.  LOWER CHEST: Small sliding hiatal hernia.  ABDOMEN/PELVIS:  Liver: Marked fatty infiltration.  Biliary: No evidence of biliary obstruction or stone.  Pancreas: Peripancreatic edema compatible with pancreatitis. No evidence of necrosis or fluid collection. No vascular compromise.  Spleen: Unremarkable.  Adrenals: Unremarkable.  Kidneys and ureters: Left renal cysts, the largest in the lower pole at 4 cm with layering milk of calcium.  Bladder: Unremarkable.  Reproductive: No pathologic findings.  Bowel: No obstruction. Normal appendix.  Retroperitoneum: Mild retroperitoneal edema likely related to the pancreas.  Peritoneum: Small volume pelvic ascites, reactive.  Vascular: No acute abnormality.  OSSEOUS: Focally advanced L2-3 degenerative disc disease. Lower lumbar facet arthropathy, especially at L4-5 and L5-S1.  IMPRESSION: 1. Pancreatitis without fluid collection. 2. Hepatic steatosis.   Electronically Signed   By: Monte Fantasia M.D.   On: 05/21/2014 13:28      Assessment/Plan Principal Problem:   Acute pancreatitis - no gallstones on CT- check Triglycerides- likely due to ETOH - NPO except ice and meds, started on aggressive IVF - recheck Lipase in AM  Active Problems:   Hypothyroidism - cont synthroid    Major depression -cont Xanax, lexapro     Alcohol dependence - start CIWA scale- has last drink was 2 days ago- only took a few sips yesterday- likely will not have withdrawal if he is telling the truth    Chronic pain syndrome - cont chronic pain meds- added IV Dilaudid for abdominal pain    HTN (hypertension) - cont lisinopril  H/o " fast heart rate" - checked HR myself- is 92-    Consulted:   Code Status: Full code  Family Communication:   DVT Prophylaxis:Heparin  Time spent: 33 min  [place], MD [facility]  If 7PM-7AM, please contact night-coverage [email or web address] 05/21/2014, 4:55 PM

## 2014-05-22 LAB — GLUCOSE, CAPILLARY
GLUCOSE-CAPILLARY: 89 mg/dL (ref 70–99)
Glucose-Capillary: 119 mg/dL — ABNORMAL HIGH (ref 70–99)
Glucose-Capillary: 207 mg/dL — ABNORMAL HIGH (ref 70–99)

## 2014-05-22 LAB — CBC
HCT: 35.8 % — ABNORMAL LOW (ref 39.0–52.0)
Hemoglobin: 11.9 g/dL — ABNORMAL LOW (ref 13.0–17.0)
MCH: 34.3 pg — ABNORMAL HIGH (ref 26.0–34.0)
MCHC: 33.2 g/dL (ref 30.0–36.0)
MCV: 103.2 fL — ABNORMAL HIGH (ref 78.0–100.0)
PLATELETS: 315 10*3/uL (ref 150–400)
RBC: 3.47 MIL/uL — ABNORMAL LOW (ref 4.22–5.81)
RDW: 14.6 % (ref 11.5–15.5)
WBC: 13.6 10*3/uL — ABNORMAL HIGH (ref 4.0–10.5)

## 2014-05-22 LAB — BASIC METABOLIC PANEL
ANION GAP: 7 (ref 5–15)
BUN: 15 mg/dL (ref 6–23)
CALCIUM: 8 mg/dL — AB (ref 8.4–10.5)
CO2: 26 mmol/L (ref 19–32)
Chloride: 99 mmol/L (ref 96–112)
Creatinine, Ser: 1.06 mg/dL (ref 0.50–1.35)
GFR calc Af Amer: 86 mL/min — ABNORMAL LOW (ref >90)
GFR calc non Af Amer: 74 mL/min — ABNORMAL LOW (ref >90)
GLUCOSE: 99 mg/dL (ref 70–99)
Potassium: 4.3 mmol/L (ref 3.5–5.1)
Sodium: 132 mmol/L — ABNORMAL LOW (ref 135–145)

## 2014-05-22 LAB — LIPASE, BLOOD: LIPASE: 80 U/L — AB (ref 11–59)

## 2014-05-22 LAB — TRIGLYCERIDES: Triglycerides: 88 mg/dL (ref <150)

## 2014-05-22 NOTE — Progress Notes (Signed)
[facility] Progress Note   KASEEM VASTINE GUR:[id number] DOB: 1950/06/10 DOA: 05/21/2014 PCP: Helane Rima, MD  Brief narrative: MAZIN EMMA is a 64 y.o. male with h/o alcohol abuse, HTN, chronic pain in back and knees on high dose Narcotics by pain management, depression/ anxiety, Hep C presents with diffuse abdominal pain with vomiting for 2 days. He is found to have acute pancreatitis   Subjective: Pain is improving- more pain in back today, no nausea.   Assessment/Plan: Principal Problem:  Acute pancreatitis - no gallstones on CT- Triglycerides normal- likely due to ETOH -started on aggressive IVF- cut back to 100 cc/hr -  Lipase normalized- start clears- advance to full liquids tomorrow AM if stable  Active Problems:  Hypothyroidism - cont synthroid   Major depression -cont Xanax, lexapro    Alcohol dependence - start CIWA scale- has last drink was 2 days prior to admission- he states he only took a few sips the day prior to admission-no symptoms of withdrawal   Chronic pain syndrome - cont chronic pain meds- added IV Dilaudid for acute abdominal pain   HTN (hypertension) - cont lisinopril  H/o " fast heart rate" - checked HR myself- is 79-     Appt with PCP: Code Status: full code Family Communication:  Disposition Plan: home in 1-2 days DVT prophylaxis: Heparin Consultants: Procedures:  Antibiotics: Anti-infectives    None      Objective: Filed Weights   05/21/14 2300  Weight: 105.688 kg (233 lb)    Intake/Output Summary (Last 24 hours) at 05/22/14 1614 Last data filed at 05/22/14 1000  Gross per 24 hour  Intake   2610 ml  Output   1250 ml  Net   1360 ml     Vitals Filed Vitals:   05/21/14 2251 05/21/14 2300 05/22/14 0357 05/22/14 1422  BP: 141/63  136/68 143/87  Pulse: 95  104 94  Temp: 99 F (37.2 C)  97.8 F (36.6 C) 98.9 F (37.2 C)  TempSrc: Oral  Axillary Oral  Resp: 18  18 20   Height:  5' 11.5" (1.816 m)     Weight:  105.688 kg (233 lb)    SpO2: 96%  94% 100%    Exam:  General:  Pt is alert, not in acute distress  HEENT: No icterus, No thrush  Cardiovascular: regular rate and rhythm, S1/S2 No murmur  Respiratory: clear to auscultation bilaterally   Abdomen: Soft, +Bowel sounds,  Tender in epigastrium, non distended, no guarding  MSK: No LE edema, cyanosis or clubbing  Data Reviewed: Basic Metabolic Panel:  Recent Labs Lab 05/21/14 1141 05/21/14 1203 05/22/14 0603  NA 131* 130* 132*  K 5.2* 4.7 4.3  CL 96 95* 99  CO2 27  --  26  GLUCOSE 121* 119* 99  BUN 19 20 15   CREATININE 1.18 1.00 1.06  CALCIUM 8.9  --  8.0*   Liver Function Tests:  Recent Labs Lab 05/21/14 1141  AST 45*  ALT 47  ALKPHOS 93  BILITOT 1.0  PROT 7.4  ALBUMIN 3.4*    Recent Labs Lab 05/21/14 1141 05/22/14 0603  LIPASE 220* 80*   No results for input(s): AMMONIA in the last 168 hours. CBC:  Recent Labs Lab 05/21/14 1141 05/21/14 1203 05/22/14 0603  WBC 16.3*  --  13.6*  NEUTROABS 13.5*  --   --   HGB 13.7 15.3 11.9*  HCT 40.4 45.0 35.8*  MCV 101.8*  --  103.2*  PLT 330  --  315   Cardiac Enzymes: No results for input(s): CKTOTAL, CKMB, CKMBINDEX, TROPONINI in the last 168 hours. BNP (last 3 results) No results for input(s): BNP in the last 8760 hours.  ProBNP (last 3 results) No results for input(s): PROBNP in the last 8760 hours.  CBG:  Recent Labs Lab 05/22/14 1141  GLUCAP 89    No results found for this or any previous visit (from the past 240 hour(s)).   Studies:  Recent x-ray studies have been reviewed in detail by the Attending Physician  Scheduled Meds:  Scheduled Meds: . ALPRAZolam  0.25 mg Oral q morning - 10a  . escitalopram  20 mg Oral q morning - 08Y  . folic acid  1 mg Oral Daily  . heparin  5,000 Units Subcutaneous 3 times per day  . levothyroxine  175 mcg Oral QAC breakfast  . lisinopril  10 mg Oral q morning - 10a  . LORazepam  0-4 mg  Intravenous Q6H   Followed by  . [START ON 05/23/2014] LORazepam  0-4 mg Intravenous Q12H  . multivitamin with minerals  1 tablet Oral Daily  . OLANZapine-FLUoxetine  1 capsule Oral QHS  . oxycodone  30 mg Oral 5 X Daily  . thiamine  100 mg Oral Daily   Or  . thiamine  100 mg Intravenous Daily   Continuous Infusions: . sodium chloride 150 mL/hr at 05/22/14 1220    Time spent on care of this patient: 65 min   [place], MD 05/22/2014, 4:14 PM  LOS: 1 day   [facility] Office  [phone] Pager - Text Page per [email or web address]  If 7PM-7AM, please contact night-coverage [email or web address]

## 2014-05-23 DIAGNOSIS — F328 Other depressive episodes: Secondary | ICD-10-CM

## 2014-05-23 LAB — GLUCOSE, CAPILLARY: GLUCOSE-CAPILLARY: 95 mg/dL (ref 70–99)

## 2014-05-23 LAB — LIPASE, BLOOD: LIPASE: 53 U/L (ref 11–59)

## 2014-05-23 MED ORDER — FOLIC ACID 1 MG PO TABS: 1.0000 mg | ORAL_TABLET | Freq: Every day | ORAL | Status: DC

## 2014-05-23 MED ORDER — LORAZEPAM 1 MG PO TABS: 1.0000 mg | ORAL_TABLET | Freq: Four times a day (QID) | ORAL | Status: DC | PRN

## 2014-05-23 NOTE — Progress Notes (Signed)
Discharge instructions given along with prescription.  Patient questions answered.  Patient family to pick patient up for discharge

## 2014-05-23 NOTE — Discharge Summary (Signed)
Physician Discharge Summary  Juan Wiggins HWE:[id number] DOB: 13-May-1950 DOA: 05/21/2014  PCP: Helane Rima, MD  Admit date: 05/21/2014 Discharge date: 05/23/2014  Recommendations for Outpatient Follow-up:  1. Patient will follow-up with primary care physician per scheduled appointment. Patient instructed to stop drinking.  2. No changes in medications on discharge.  Discharge Diagnoses:  Principal Problem:   Acute pancreatitis Active Problems:   Hypothyroidism   Major depression   Alcohol dependence   Chronic pain syndrome   HTN (hypertension)    Discharge Condition: stable   Diet recommendation: as tolerated   History of present illness:  64 y.o. male with past medical history of alcohol abuse, depression, chronic back pain, depression who presented with worsening abdominal pain associated with nausea and vomiting for past 2 days prior to this admission. Patient could not tolerate by mouth intake at all. On admission he was found to have pancreatitis with lipase level of 220. LFTs were significant for AST 45 otherwise unremarkable.  His lipase level is now within normal limits. He is tolerating by mouth intake. He stable for discharge.  Hospital Course:   Principal Problem:   Acute pancreatitis - Likely related to alcohol abuse - Lipase level CCXX on the admission. Lipase level trended down to normal. He is now tolerating by mouth intake.  Active Problems:   Hypothyroidism - Continue Synthroid 175 g daily        Major depression - May continue home medications on discharge    Alcohol dependence - On CIWA protocol in hospital - No reports of withdrawals - Alcohol level not obtained on the admission   Signed:  Leisa Lenz, MD  [facility] 05/23/2014, 10:09 AM  Pager #: [phone]  Discharge Exam: Filed Vitals:   05/23/14 0546  BP: 147/80  Pulse: 91  Temp: 99.9 F (37.7 C)  Resp: 20   Filed Vitals:   05/22/14 0357 05/22/14 1422 05/22/14  2220 05/23/14 0546  BP: 136/68 143/87 129/75 147/80  Pulse: 104 94 93 91  Temp: 97.8 F (36.6 C) 98.9 F (37.2 C) 98.3 F (36.8 C) 99.9 F (37.7 C)  TempSrc: Axillary Oral Oral Axillary  Resp: 18 20 18 20   Height:      Weight:      SpO2: 94% 100% 94% 99%    General: Pt is alert, follows commands appropriately, not in acute distress Cardiovascular: Regular rate and rhythm, S1/S2 +, no murmurs Respiratory: Clear to auscultation bilaterally, no wheezing, no crackles, no rhonchi Abdominal: Soft, non tender, non distended, bowel sounds +, no guarding Extremities: no edema, no cyanosis, pulses palpable bilaterally DP and PT Neuro: Grossly nonfocal  Discharge Instructions  Discharge Instructions    Call MD for:  difficulty breathing, headache or visual disturbances    Complete by:  As directed      Call MD for:  persistant nausea and vomiting    Complete by:  As directed      Call MD for:  redness, tenderness, or signs of infection (pain, swelling, redness, odor or green/yellow discharge around incision site)    Complete by:  As directed      Call MD for:  severe uncontrolled pain    Complete by:  As directed      Diet - low sodium heart healthy    Complete by:  As directed      Increase activity slowly    Complete by:  As directed  Medication List    STOP taking these medications        oxyCODONE-acetaminophen 5-325 MG per tablet  Commonly known as:  PERCOCET/ROXICET     rivaroxaban 10 MG Tabs tablet  Commonly known as:  XARELTO      TAKE these medications        docusate sodium 100 MG capsule  Commonly known as:  COLACE  Take 1 capsule (100 mg total) by mouth 2 (two) times daily.     escitalopram 20 MG tablet  Commonly known as:  LEXAPRO  Take 20 mg by mouth every morning.     folic acid 1 MG tablet  Commonly known as:  FOLVITE  Take 1 tablet (1 mg total) by mouth daily.     levothyroxine 175 MCG tablet  Commonly known as:  SYNTHROID,  LEVOTHROID  Take 175 mcg by mouth daily before breakfast.     lisinopril 10 MG tablet  Commonly known as:  PRINIVIL,ZESTRIL  Take 10 mg by mouth every morning.     LORazepam 1 MG tablet  Commonly known as:  ATIVAN  Take 1 tablet (1 mg total) by mouth every 6 (six) hours as needed (CIWA-AR > 8  -OR-  withdrawal symptoms:  anxiety, agitation, insomnia, diaphoresis, nausea, vomiting, tremors, tachycardia, or hypertension.).     methocarbamol 500 MG tablet  Commonly known as:  ROBAXIN  Take 1 tablet (500 mg total) by mouth every 6 (six) hours as needed for muscle spasms.     OLANZapine-FLUoxetine 12-50 MG per capsule  Commonly known as:  SYMBYAX  Take 1 capsule by mouth at bedtime.     OVER THE COUNTER MEDICATION  Place into both eyes daily as needed (contacts.). Flushes eyes     oxycodone 30 MG immediate release tablet  Commonly known as:  ROXICODONE  Take 30 mg by mouth 5 (five) times daily.     zolpidem 10 MG tablet  Commonly known as:  AMBIEN  Take 10 mg by mouth at bedtime as needed for sleep.           Follow-up Information    Follow up with Helane Rima, MD. Schedule an appointment as soon as possible for a visit in 1 week.   Specialty:  Family Medicine   Why:  Follow up appt after recent hospitalization   Contact information:   [address] [place] [address] [phone]        The results of significant diagnostics from this hospitalization (including imaging, microbiology, ancillary and laboratory) are listed below for reference.    Significant Diagnostic Studies: Ct Abdomen Pelvis W Contrast  05/21/2014   CLINICAL DATA:  Mid abdominal pain with distension. Nausea, vomiting, and diarrhea for 48 hours.  EXAM: CT ABDOMEN AND PELVIS WITH CONTRAST  TECHNIQUE: Multidetector CT imaging of the abdomen and pelvis was performed using the standard protocol following bolus administration of intravenous contrast.  CONTRAST:  39mL OMNIPAQUE  IOHEXOL 300 MG/ML SOLN, 168mL OMNIPAQUE IOHEXOL 300 MG/ML SOLN  COMPARISON:  06/24/2009  FINDINGS: BODY WALL: Asymmetric appearance of the right inguinal canal suggests remote hernia repair. There is a probable small fatty left inguinal hernia.  LOWER CHEST: Small sliding hiatal hernia.  ABDOMEN/PELVIS:  Liver: Marked fatty infiltration.  Biliary: No evidence of biliary obstruction or stone.  Pancreas: Peripancreatic edema compatible with pancreatitis. No evidence of necrosis or fluid collection. No vascular compromise.  Spleen: Unremarkable.  Adrenals: Unremarkable.  Kidneys and ureters: Left renal cysts, the  largest in the lower pole at 4 cm with layering milk of calcium.  Bladder: Unremarkable.  Reproductive: No pathologic findings.  Bowel: No obstruction. Normal appendix.  Retroperitoneum: Mild retroperitoneal edema likely related to the pancreas.  Peritoneum: Small volume pelvic ascites, reactive.  Vascular: No acute abnormality.  OSSEOUS: Focally advanced L2-3 degenerative disc disease. Lower lumbar facet arthropathy, especially at L4-5 and L5-S1.  IMPRESSION: 1. Pancreatitis without fluid collection. 2. Hepatic steatosis.   Electronically Signed   By: Monte Fantasia M.D.   On: 05/21/2014 13:28    Microbiology: No results found for this or any previous visit (from the past 240 hour(s)).   Labs: Basic Metabolic Panel:  Recent Labs Lab 05/21/14 1141 05/21/14 1203 05/22/14 0603  NA 131* 130* 132*  K 5.2* 4.7 4.3  CL 96 95* 99  CO2 27  --  26  GLUCOSE 121* 119* 99  BUN 19 20 15   CREATININE 1.18 1.00 1.06  CALCIUM 8.9  --  8.0*   Liver Function Tests:  Recent Labs Lab 05/21/14 1141  AST 45*  ALT 47  ALKPHOS 93  BILITOT 1.0  PROT 7.4  ALBUMIN 3.4*    Recent Labs Lab 05/21/14 1141 05/22/14 0603 05/23/14 0830  LIPASE 220* 80* 53   No results for input(s): AMMONIA in the last 168 hours. CBC:  Recent Labs Lab 05/21/14 1141 05/21/14 1203 05/22/14 0603  WBC 16.3*  --   13.6*  NEUTROABS 13.5*  --   --   HGB 13.7 15.3 11.9*  HCT 40.4 45.0 35.8*  MCV 101.8*  --  103.2*  PLT 330  --  315   Cardiac Enzymes: No results for input(s): CKTOTAL, CKMB, CKMBINDEX, TROPONINI in the last 168 hours. BNP: BNP (last 3 results) No results for input(s): BNP in the last 8760 hours.  ProBNP (last 3 results) No results for input(s): PROBNP in the last 8760 hours.  CBG:  Recent Labs Lab 05/22/14 1141 05/22/14 1809 05/22/14 2336 05/23/14 0545  GLUCAP 89 207* 119* 95    Time coordinating discharge: Over 30 minutes

## 2014-05-23 NOTE — Discharge Instructions (Signed)
Acute Pancreatitis °Acute pancreatitis is a disease in which the pancreas becomes suddenly irritated (inflamed). The pancreas is a large gland behind your stomach. The pancreas makes enzymes that help digest food. The pancreas also makes 2 hormones that help control your blood sugar. Acute pancreatitis happens when the enzymes attack and damage the pancreas. Most attacks last a couple of days and can cause serious problems. °HOME CARE °· Follow your doctor's diet instructions. You may need to avoid alcohol and limit fat in your diet. °· Eat small meals often. °· Drink enough fluids to keep your pee (urine) clear or pale yellow. °· Only take medicines as told by your doctor. °· Avoid drinking alcohol if it caused your disease. °· Do not smoke. °· Get plenty of rest. °· Check your blood sugar at home as told by your doctor. °· Keep all doctor visits as told. °GET HELP IF: °· You do not get better as quickly as expected. °· You have new or worsening symptoms. °· You have lasting pain, weakness, or feel sick to your stomach (nauseous). °· You get better and then have another pain attack. °GET HELP RIGHT AWAY IF:  °· You are unable to eat or keep fluids down. °· Your pain becomes severe. °· You have a fever or lasting symptoms for more than 2 to 3 days. °· You have a fever and your symptoms suddenly get worse. °· Your skin or the white part of your eyes turn yellow (jaundice). °· You throw up (vomit). °· You feel dizzy, or you pass out (faint). °· Your blood sugar is high (over 300 mg/dL). °MAKE SURE YOU:  °· Understand these instructions. °· Will watch your condition. °· Will get help right away if you are not doing well or get worse. °Document Released: 07/29/2007 Document Revised: 06/26/2013 Document Reviewed: 05/21/2011 °Exit[employer]® Patient Information ©2015 [employer]. This information is not intended to replace advice given to you by your health care provider. Make sure you discuss any questions you have with your  health care provider. ° °

## 2014-06-05 ENCOUNTER — Encounter (HOSPITAL_COMMUNITY): Payer: Self-pay | Admitting: *Deleted

## 2014-06-05 MED ORDER — CEFAZOLIN SODIUM-DEXTROSE 2-3 GM-% IV SOLR
2.0000 g | INTRAVENOUS | Status: AC
  Administered 2014-06-06: 2 g via INTRAVENOUS
  Filled 2014-06-05: qty 50

## 2014-06-05 NOTE — Progress Notes (Signed)
Pt denies SOB, chest pain, and being under the care of a cardiologist. Pt denies having a stress test, echo and cardiac cath. Pt made aware to stop taking  Aspirin, otc vitamins and herbal medications. Do not take any NSAIDs ie: Ibuprofen, Advil, Naproxen or any medication containing Aspirin.Pt verbalized understanding of all pre-op instructions.

## 2014-06-06 ENCOUNTER — Inpatient Hospital Stay (HOSPITAL_COMMUNITY)
Admission: RE | Admit: 2014-06-06 | Discharge: 2014-06-07 | DRG: 517 | Disposition: A | Discharge status: Home / Self Care | Payer: 59 | Source: Ambulatory Visit | Attending: Orthopedic Surgery | Admitting: Orthopedic Surgery

## 2014-06-06 ENCOUNTER — Encounter (HOSPITAL_COMMUNITY)
Admission: RE | Disposition: A | Discharge status: Home / Self Care | Payer: Self-pay | Source: Ambulatory Visit | Attending: Orthopedic Surgery

## 2014-06-06 ENCOUNTER — Encounter (HOSPITAL_COMMUNITY): Payer: Self-pay | Admitting: *Deleted

## 2014-06-06 ENCOUNTER — Ambulatory Visit (HOSPITAL_COMMUNITY): Payer: 59 | Admitting: Emergency Medicine

## 2014-06-06 ENCOUNTER — Ambulatory Visit (HOSPITAL_COMMUNITY): Payer: 59

## 2014-06-06 DIAGNOSIS — I10 Essential (primary) hypertension: Secondary | ICD-10-CM | POA: Diagnosis present

## 2014-06-06 DIAGNOSIS — Z79899 Other long term (current) drug therapy: Secondary | ICD-10-CM | POA: Diagnosis not present

## 2014-06-06 DIAGNOSIS — F329 Major depressive disorder, single episode, unspecified: Secondary | ICD-10-CM | POA: Diagnosis present

## 2014-06-06 DIAGNOSIS — E039 Hypothyroidism, unspecified: Secondary | ICD-10-CM | POA: Diagnosis present

## 2014-06-06 DIAGNOSIS — M545 Low back pain: Secondary | ICD-10-CM | POA: Diagnosis present

## 2014-06-06 DIAGNOSIS — M179 Osteoarthritis of knee, unspecified: Secondary | ICD-10-CM | POA: Diagnosis present

## 2014-06-06 DIAGNOSIS — F419 Anxiety disorder, unspecified: Secondary | ICD-10-CM | POA: Diagnosis present

## 2014-06-06 DIAGNOSIS — M069 Rheumatoid arthritis, unspecified: Secondary | ICD-10-CM | POA: Diagnosis present

## 2014-06-06 DIAGNOSIS — M549 Dorsalgia, unspecified: Secondary | ICD-10-CM | POA: Diagnosis present

## 2014-06-06 DIAGNOSIS — M7138 Other bursal cyst, other site: Secondary | ICD-10-CM | POA: Diagnosis present

## 2014-06-06 DIAGNOSIS — F17290 Nicotine dependence, other tobacco product, uncomplicated: Secondary | ICD-10-CM | POA: Diagnosis present

## 2014-06-06 DIAGNOSIS — F40240 Claustrophobia: Secondary | ICD-10-CM | POA: Diagnosis present

## 2014-06-06 DIAGNOSIS — Z96651 Presence of right artificial knee joint: Secondary | ICD-10-CM | POA: Diagnosis present

## 2014-06-06 DIAGNOSIS — G4730 Sleep apnea, unspecified: Secondary | ICD-10-CM | POA: Diagnosis present

## 2014-06-06 DIAGNOSIS — K219 Gastro-esophageal reflux disease without esophagitis: Secondary | ICD-10-CM | POA: Diagnosis present

## 2014-06-06 DIAGNOSIS — M4806 Spinal stenosis, lumbar region: Principal | ICD-10-CM | POA: Diagnosis present

## 2014-06-06 DIAGNOSIS — Z419 Encounter for procedure for purposes other than remedying health state, unspecified: Secondary | ICD-10-CM

## 2014-06-06 HISTORY — PX: LUMBAR LAMINECTOMY/DECOMPRESSION MICRODISCECTOMY: SHX5026

## 2014-06-06 HISTORY — DX: Alcohol induced acute pancreatitis without necrosis or infection: K85.20

## 2014-06-06 LAB — CBC
HCT: 37.8 % — ABNORMAL LOW (ref 39.0–52.0)
HEMOGLOBIN: 12.8 g/dL — AB (ref 13.0–17.0)
MCH: 33.7 pg (ref 26.0–34.0)
MCHC: 33.9 g/dL (ref 30.0–36.0)
MCV: 99.5 fL (ref 78.0–100.0)
Platelets: 385 10*3/uL (ref 150–400)
RBC: 3.8 MIL/uL — ABNORMAL LOW (ref 4.22–5.81)
RDW: 13.7 % (ref 11.5–15.5)
WBC: 8.3 10*3/uL (ref 4.0–10.5)

## 2014-06-06 LAB — COMPREHENSIVE METABOLIC PANEL
ALK PHOS: 92 U/L (ref 39–117)
ALT: 32 U/L (ref 0–53)
ANION GAP: 11 (ref 5–15)
AST: 35 U/L (ref 0–37)
Albumin: 3.4 g/dL — ABNORMAL LOW (ref 3.5–5.2)
BUN: 8 mg/dL (ref 6–23)
CALCIUM: 9.2 mg/dL (ref 8.4–10.5)
CHLORIDE: 102 mmol/L (ref 96–112)
CO2: 24 mmol/L (ref 19–32)
CREATININE: 1.22 mg/dL (ref 0.50–1.35)
GFR calc Af Amer: 72 mL/min — ABNORMAL LOW (ref >90)
GFR calc non Af Amer: 62 mL/min — ABNORMAL LOW (ref >90)
Glucose, Bld: 118 mg/dL — ABNORMAL HIGH (ref 70–99)
Potassium: 4.4 mmol/L (ref 3.5–5.1)
Sodium: 137 mmol/L (ref 135–145)
TOTAL PROTEIN: 7.6 g/dL (ref 6.0–8.3)
Total Bilirubin: 0.5 mg/dL (ref 0.3–1.2)

## 2014-06-06 LAB — SURGICAL PCR SCREEN
MRSA, PCR: NEGATIVE
STAPHYLOCOCCUS AUREUS: NEGATIVE

## 2014-06-06 SURGERY — LUMBAR LAMINECTOMY/DECOMPRESSION MICRODISCECTOMY 1 LEVEL
Anesthesia: General | Laterality: Left

## 2014-06-06 MED ORDER — OXYCODONE HCL 5 MG/5ML PO SOLN: 5.0000 mg | Freq: Once | ORAL | Status: AC | PRN

## 2014-06-06 MED ORDER — EPHEDRINE SULFATE 50 MG/ML IJ SOLN
INTRAMUSCULAR | Status: DC | PRN
  Administered 2014-06-06 (×2): 10 mg via INTRAVENOUS

## 2014-06-06 MED ORDER — NEOSTIGMINE METHYLSULFATE 10 MG/10ML IV SOLN
INTRAVENOUS | Status: DC | PRN
  Administered 2014-06-06: 4 mg via INTRAVENOUS

## 2014-06-06 MED ORDER — LACTATED RINGERS IV SOLN
INTRAVENOUS | Status: DC
  Administered 2014-06-06 (×2): via INTRAVENOUS

## 2014-06-06 MED ORDER — LACTATED RINGERS IV SOLN: INTRAVENOUS | Status: DC

## 2014-06-06 MED ORDER — OXYCODONE HCL 5 MG PO TABS
10.0000 mg | ORAL_TABLET | ORAL | Status: DC | PRN
  Administered 2014-06-06: 10 mg via ORAL
  Filled 2014-06-06: qty 2

## 2014-06-06 MED ORDER — MUPIROCIN 2 % EX OINT
TOPICAL_OINTMENT | CUTANEOUS | Status: AC
  Administered 2014-06-06: 1 via TOPICAL
  Filled 2014-06-06: qty 22

## 2014-06-06 MED ORDER — VECURONIUM BROMIDE 10 MG IV SOLR
INTRAVENOUS | Status: DC | PRN
  Administered 2014-06-06: 3 mg via INTRAVENOUS

## 2014-06-06 MED ORDER — ETOMIDATE 2 MG/ML IV SOLN
INTRAVENOUS | Status: AC
  Filled 2014-06-06: qty 10

## 2014-06-06 MED ORDER — HEMOSTATIC AGENTS (NO CHARGE) OPTIME
TOPICAL | Status: DC | PRN
  Administered 2014-06-06: 1 via TOPICAL

## 2014-06-06 MED ORDER — GLYCOPYRROLATE 0.2 MG/ML IJ SOLN
INTRAMUSCULAR | Status: DC | PRN
  Administered 2014-06-06: 0.2 mg via INTRAVENOUS
  Administered 2014-06-06: 0.6 mg via INTRAVENOUS

## 2014-06-06 MED ORDER — PHENYLEPHRINE HCL 10 MG/ML IJ SOLN
10.0000 mg | INTRAMUSCULAR | Status: DC | PRN
  Administered 2014-06-06: 15 ug/min via INTRAVENOUS

## 2014-06-06 MED ORDER — LIDOCAINE HCL (CARDIAC) 20 MG/ML IV SOLN
INTRAVENOUS | Status: AC
  Filled 2014-06-06: qty 5

## 2014-06-06 MED ORDER — LISINOPRIL 10 MG PO TABS
10.0000 mg | ORAL_TABLET | Freq: Every morning | ORAL | Status: DC
  Administered 2014-06-07: 10 mg via ORAL
  Filled 2014-06-06: qty 1

## 2014-06-06 MED ORDER — PROMETHAZINE HCL 25 MG/ML IJ SOLN: 6.2500 mg | INTRAMUSCULAR | Status: DC | PRN

## 2014-06-06 MED ORDER — ARTIFICIAL TEARS OP OINT
TOPICAL_OINTMENT | OPHTHALMIC | Status: AC
  Filled 2014-06-06: qty 3.5

## 2014-06-06 MED ORDER — ETOMIDATE 2 MG/ML IV SOLN
INTRAVENOUS | Status: DC | PRN
  Administered 2014-06-06: 20 mg via INTRAVENOUS

## 2014-06-06 MED ORDER — METHOCARBAMOL 1000 MG/10ML IJ SOLN
500.0000 mg | Freq: Four times a day (QID) | INTRAVENOUS | Status: DC | PRN
  Administered 2014-06-06: 500 mg via INTRAVENOUS
  Filled 2014-06-06 (×2): qty 5

## 2014-06-06 MED ORDER — OXYCODONE HCL 5 MG PO TABS
30.0000 mg | ORAL_TABLET | Freq: Every day | ORAL | Status: DC | PRN
  Administered 2014-06-07 (×4): 30 mg via ORAL
  Filled 2014-06-06 (×4): qty 6

## 2014-06-06 MED ORDER — HYDROMORPHONE HCL 1 MG/ML IJ SOLN
1.0000 mg | INTRAMUSCULAR | Status: DC | PRN
  Administered 2014-06-06 – 2014-06-07 (×2): 1 mg via INTRAVENOUS
  Filled 2014-06-06 (×2): qty 1

## 2014-06-06 MED ORDER — LEVOTHYROXINE SODIUM 175 MCG PO TABS
175.0000 ug | ORAL_TABLET | Freq: Every day | ORAL | Status: DC
  Administered 2014-06-07: 175 ug via ORAL
  Filled 2014-06-06 (×2): qty 1

## 2014-06-06 MED ORDER — THROMBIN 20000 UNITS EX SOLR
CUTANEOUS | Status: AC
  Filled 2014-06-06: qty 20000

## 2014-06-06 MED ORDER — EPHEDRINE SULFATE 50 MG/ML IJ SOLN
INTRAMUSCULAR | Status: AC
  Filled 2014-06-06: qty 1

## 2014-06-06 MED ORDER — PHENOL 1.4 % MT LIQD: 1.0000 | OROMUCOSAL | Status: DC | PRN

## 2014-06-06 MED ORDER — BUPIVACAINE-EPINEPHRINE (PF) 0.25% -1:200000 IJ SOLN
INTRAMUSCULAR | Status: AC
  Filled 2014-06-06: qty 30

## 2014-06-06 MED ORDER — MORPHINE SULFATE 2 MG/ML IJ SOLN
1.0000 mg | INTRAMUSCULAR | Status: DC | PRN
  Administered 2014-06-06: 4 mg via INTRAVENOUS
  Filled 2014-06-06: qty 2

## 2014-06-06 MED ORDER — METHOCARBAMOL 500 MG PO TABS
500.0000 mg | ORAL_TABLET | Freq: Four times a day (QID) | ORAL | Status: DC | PRN
  Administered 2014-06-07 (×2): 500 mg via ORAL
  Filled 2014-06-06 (×3): qty 1

## 2014-06-06 MED ORDER — OXYCODONE HCL 5 MG PO TABS
5.0000 mg | ORAL_TABLET | Freq: Once | ORAL | Status: AC | PRN
  Administered 2014-06-06: 5 mg via ORAL

## 2014-06-06 MED ORDER — ONDANSETRON HCL 4 MG/2ML IJ SOLN
4.0000 mg | INTRAMUSCULAR | Status: DC | PRN
Start: 2014-06-06 — End: 2014-06-07

## 2014-06-06 MED ORDER — ALPRAZOLAM 0.25 MG PO TABS
0.2500 mg | ORAL_TABLET | Freq: Three times a day (TID) | ORAL | Status: DC | PRN
  Administered 2014-06-06: 0.25 mg via ORAL
  Filled 2014-06-06: qty 1

## 2014-06-06 MED ORDER — OXYCODONE HCL 5 MG PO TABS
ORAL_TABLET | ORAL | Status: AC
  Filled 2014-06-06: qty 1

## 2014-06-06 MED ORDER — SCOPOLAMINE 1 MG/3DAYS TD PT72
1.0000 | MEDICATED_PATCH | TRANSDERMAL | Status: DC
  Administered 2014-06-06: 1.5 mg via TRANSDERMAL
  Filled 2014-06-06: qty 1

## 2014-06-06 MED ORDER — HYDROMORPHONE HCL 1 MG/ML IJ SOLN
INTRAMUSCULAR | Status: AC
  Administered 2014-06-06: 0.5 mg
  Filled 2014-06-06: qty 1

## 2014-06-06 MED ORDER — FENTANYL CITRATE 0.05 MG/ML IJ SOLN
INTRAMUSCULAR | Status: DC | PRN
  Administered 2014-06-06 (×5): 50 ug via INTRAVENOUS

## 2014-06-06 MED ORDER — HYDROMORPHONE HCL 1 MG/ML IJ SOLN
0.2500 mg | INTRAMUSCULAR | Status: DC | PRN
  Administered 2014-06-06 (×4): 0.5 mg via INTRAVENOUS

## 2014-06-06 MED ORDER — ONDANSETRON HCL 4 MG/2ML IJ SOLN
INTRAMUSCULAR | Status: DC | PRN
  Administered 2014-06-06: 4 mg via INTRAVENOUS

## 2014-06-06 MED ORDER — MENTHOL 3 MG MT LOZG: 1.0000 | LOZENGE | OROMUCOSAL | Status: DC | PRN

## 2014-06-06 MED ORDER — PHENYLEPHRINE 40 MCG/ML (10ML) SYRINGE FOR IV PUSH (FOR BLOOD PRESSURE SUPPORT)
PREFILLED_SYRINGE | INTRAVENOUS | Status: AC
  Filled 2014-06-06: qty 10

## 2014-06-06 MED ORDER — BUPIVACAINE-EPINEPHRINE 0.25% -1:200000 IJ SOLN
INTRAMUSCULAR | Status: DC | PRN
  Administered 2014-06-06: 10 mL

## 2014-06-06 MED ORDER — ROCURONIUM BROMIDE 100 MG/10ML IV SOLN
INTRAVENOUS | Status: DC | PRN
  Administered 2014-06-06: 50 mg via INTRAVENOUS

## 2014-06-06 MED ORDER — FENTANYL CITRATE 0.05 MG/ML IJ SOLN
INTRAMUSCULAR | Status: AC
  Filled 2014-06-06: qty 5

## 2014-06-06 MED ORDER — PHENYLEPHRINE HCL 10 MG/ML IJ SOLN
INTRAMUSCULAR | Status: DC | PRN
  Administered 2014-06-06: 80 ug via INTRAVENOUS
  Administered 2014-06-06: 40 ug via INTRAVENOUS
  Administered 2014-06-06 (×3): 80 ug via INTRAVENOUS
  Administered 2014-06-06: 40 ug via INTRAVENOUS

## 2014-06-06 MED ORDER — OLANZAPINE-FLUOXETINE HCL 12-50 MG PO CAPS
1.0000 | ORAL_CAPSULE | Freq: Every day | ORAL | Status: DC
  Administered 2014-06-06: 1 via ORAL
  Filled 2014-06-06 (×2): qty 1

## 2014-06-06 MED ORDER — ALBUMIN HUMAN 5 % IV SOLN
INTRAVENOUS | Status: DC | PRN
  Administered 2014-06-06: 15:00:00 via INTRAVENOUS

## 2014-06-06 MED ORDER — SODIUM CHLORIDE 0.9 % IJ SOLN: 3.0000 mL | INTRAMUSCULAR | Status: DC | PRN

## 2014-06-06 MED ORDER — GLYCOPYRROLATE 0.2 MG/ML IJ SOLN
INTRAMUSCULAR | Status: AC
  Filled 2014-06-06: qty 2

## 2014-06-06 MED ORDER — SODIUM CHLORIDE 0.9 % IJ SOLN
INTRAMUSCULAR | Status: AC
  Filled 2014-06-06: qty 10

## 2014-06-06 MED ORDER — MIDAZOLAM HCL 2 MG/2ML IJ SOLN
INTRAMUSCULAR | Status: AC
  Filled 2014-06-06: qty 2

## 2014-06-06 MED ORDER — SUCCINYLCHOLINE CHLORIDE 20 MG/ML IJ SOLN
INTRAMUSCULAR | Status: AC
  Filled 2014-06-06: qty 1

## 2014-06-06 MED ORDER — DEXAMETHASONE 4 MG PO TABS
4.0000 mg | ORAL_TABLET | Freq: Four times a day (QID) | ORAL | Status: AC
  Administered 2014-06-06 – 2014-06-07 (×4): 4 mg via ORAL
  Filled 2014-06-06 (×4): qty 1

## 2014-06-06 MED ORDER — SODIUM CHLORIDE 0.9 % IJ SOLN
3.0000 mL | Freq: Two times a day (BID) | INTRAMUSCULAR | Status: DC
  Administered 2014-06-06: 3 mL via INTRAVENOUS

## 2014-06-06 MED ORDER — METHOCARBAMOL 500 MG PO TABS
ORAL_TABLET | ORAL | Status: AC
Start: 2014-06-06 — End: 2014-06-07
  Filled 2014-06-06: qty 1

## 2014-06-06 MED ORDER — HYDROMORPHONE HCL 1 MG/ML IJ SOLN
INTRAMUSCULAR | Status: AC
  Filled 2014-06-06: qty 1

## 2014-06-06 MED ORDER — DEXAMETHASONE SODIUM PHOSPHATE 4 MG/ML IJ SOLN
INTRAMUSCULAR | Status: AC
  Filled 2014-06-06: qty 2

## 2014-06-06 MED ORDER — LIDOCAINE HCL (CARDIAC) 20 MG/ML IV SOLN
INTRAVENOUS | Status: DC | PRN
  Administered 2014-06-06: 100 mg via INTRAVENOUS

## 2014-06-06 MED ORDER — MUPIROCIN 2 % EX OINT
1.0000 "application " | TOPICAL_OINTMENT | Freq: Once | CUTANEOUS | Status: AC
  Administered 2014-06-06: 1 via TOPICAL

## 2014-06-06 MED ORDER — SURGIFOAM 100 EX MISC
CUTANEOUS | Status: DC | PRN
  Administered 2014-06-06: 15:00:00 via TOPICAL

## 2014-06-06 MED ORDER — ARTIFICIAL TEARS OP OINT
TOPICAL_OINTMENT | OPHTHALMIC | Status: DC | PRN
  Administered 2014-06-06: 1 via OPHTHALMIC

## 2014-06-06 MED ORDER — NEOSTIGMINE METHYLSULFATE 10 MG/10ML IV SOLN
INTRAVENOUS | Status: AC
  Filled 2014-06-06: qty 1

## 2014-06-06 MED ORDER — DEXAMETHASONE SODIUM PHOSPHATE 4 MG/ML IJ SOLN
4.0000 mg | Freq: Four times a day (QID) | INTRAMUSCULAR | Status: AC
  Filled 2014-06-06: qty 1

## 2014-06-06 MED ORDER — MIDAZOLAM HCL 5 MG/5ML IJ SOLN
INTRAMUSCULAR | Status: DC | PRN
  Administered 2014-06-06: 2 mg via INTRAVENOUS

## 2014-06-06 MED ORDER — PROPOFOL 10 MG/ML IV BOLUS
INTRAVENOUS | Status: AC
  Filled 2014-06-06: qty 20

## 2014-06-06 MED ORDER — ESCITALOPRAM OXALATE 20 MG PO TABS
20.0000 mg | ORAL_TABLET | Freq: Every morning | ORAL | Status: DC
  Administered 2014-06-07: 20 mg via ORAL
  Filled 2014-06-06: qty 1

## 2014-06-06 MED ORDER — VECURONIUM BROMIDE 10 MG IV SOLR
INTRAVENOUS | Status: AC
  Filled 2014-06-06: qty 10

## 2014-06-06 MED ORDER — STERILE WATER FOR INJECTION IJ SOLN
INTRAMUSCULAR | Status: AC
  Filled 2014-06-06: qty 10

## 2014-06-06 MED ORDER — CEFAZOLIN SODIUM 1-5 GM-% IV SOLN
1.0000 g | Freq: Three times a day (TID) | INTRAVENOUS | Status: AC
  Administered 2014-06-06 – 2014-06-07 (×2): 1 g via INTRAVENOUS
  Filled 2014-06-06 (×2): qty 50

## 2014-06-06 MED ORDER — ROCURONIUM BROMIDE 50 MG/5ML IV SOLN
INTRAVENOUS | Status: AC
  Filled 2014-06-06: qty 1

## 2014-06-06 SURGICAL SUPPLY — 64 items
BUR EGG ELITE 4.0 (BURR) IMPLANT
BUR EGG ELITE 4.0MM (BURR)
BUR MATCHSTICK NEURO 3.0 LAGG (BURR) IMPLANT
CANISTER SUCTION 2500CC (MISCELLANEOUS) ×3 IMPLANT
CLOSURE STERI-STRIP 1/2X4 (GAUZE/BANDAGES/DRESSINGS) ×1
CLSR STERI-STRIP ANTIMIC 1/2X4 (GAUZE/BANDAGES/DRESSINGS) ×2 IMPLANT
CORDS BIPOLAR (ELECTRODE) ×3 IMPLANT
COVER SURGICAL LIGHT HANDLE (MISCELLANEOUS) ×3 IMPLANT
DRAIN CHANNEL 15F RND FF W/TCR (WOUND CARE) IMPLANT
DRAPE POUCH INSTRU U-SHP 10X18 (DRAPES) ×3 IMPLANT
DRAPE SURG 17X23 STRL (DRAPES) ×3 IMPLANT
DRAPE U-SHAPE 47X51 STRL (DRAPES) ×3 IMPLANT
DRSG MEPILEX BORDER 4X4 (GAUZE/BANDAGES/DRESSINGS) ×3 IMPLANT
DRSG MEPILEX BORDER 4X8 (GAUZE/BANDAGES/DRESSINGS) ×3 IMPLANT
DURAPREP 26ML APPLICATOR (WOUND CARE) ×3 IMPLANT
ELECT BLADE 4.0 EZ CLEAN MEGAD (MISCELLANEOUS)
ELECT CAUTERY BLADE 6.4 (BLADE) ×3 IMPLANT
ELECT PENCIL ROCKER SW 15FT (MISCELLANEOUS) ×3 IMPLANT
ELECT REM PT RETURN 9FT ADLT (ELECTROSURGICAL) ×3
ELECTRODE BLDE 4.0 EZ CLN MEGD (MISCELLANEOUS) IMPLANT
ELECTRODE REM PT RTRN 9FT ADLT (ELECTROSURGICAL) ×1 IMPLANT
EVACUATOR SILICONE 100CC (DRAIN) IMPLANT
GLOVE BIOGEL PI IND STRL 6.5 (GLOVE) ×1 IMPLANT
GLOVE BIOGEL PI IND STRL 8 (GLOVE) ×1 IMPLANT
GLOVE BIOGEL PI IND STRL 8.5 (GLOVE) ×1 IMPLANT
GLOVE BIOGEL PI INDICATOR 6.5 (GLOVE) ×2
GLOVE BIOGEL PI INDICATOR 8 (GLOVE) ×2
GLOVE BIOGEL PI INDICATOR 8.5 (GLOVE) ×2
GLOVE ORTHO TXT STRL SZ7.5 (GLOVE) ×3 IMPLANT
GLOVE SS BIOGEL STRL SZ 8.5 (GLOVE) ×1 IMPLANT
GLOVE SUPERSENSE BIOGEL SZ 8.5 (GLOVE) ×2
GLOVE SURG SS PI 6.0 STRL IVOR (GLOVE) ×3 IMPLANT
GOWN STRL REUS W/ TWL LRG LVL3 (GOWN DISPOSABLE) ×1 IMPLANT
GOWN STRL REUS W/TWL 2XL LVL3 (GOWN DISPOSABLE) ×6 IMPLANT
GOWN STRL REUS W/TWL LRG LVL3 (GOWN DISPOSABLE) ×2
KIT BASIN OR (CUSTOM PROCEDURE TRAY) ×3 IMPLANT
KIT ROOM TURNOVER OR (KITS) ×3 IMPLANT
NEEDLE 22X1 1/2 (OR ONLY) (NEEDLE) ×3 IMPLANT
NEEDLE SPNL 18GX3.5 QUINCKE PK (NEEDLE) ×6 IMPLANT
NS IRRIG 1000ML POUR BTL (IV SOLUTION) ×3 IMPLANT
PACK LAMINECTOMY ORTHO (CUSTOM PROCEDURE TRAY) ×3 IMPLANT
PACK UNIVERSAL I (CUSTOM PROCEDURE TRAY) ×3 IMPLANT
PAD ARMBOARD 7.5X6 YLW CONV (MISCELLANEOUS) ×6 IMPLANT
PATTIES SURGICAL .5 X.5 (GAUZE/BANDAGES/DRESSINGS) IMPLANT
PATTIES SURGICAL .5 X1 (DISPOSABLE) ×3 IMPLANT
SPONGE SURGIFOAM ABS GEL 100 (HEMOSTASIS) IMPLANT
SURGIFLO TRUKIT (HEMOSTASIS) IMPLANT
SURGIFLO W/THROMBIN 8M KIT (HEMOSTASIS) ×3 IMPLANT
SUT BONE WAX W31G (SUTURE) ×3 IMPLANT
SUT MON AB 3-0 SH 27 (SUTURE) ×2
SUT MON AB 3-0 SH27 (SUTURE) ×1 IMPLANT
SUT VIC AB 0 CT1 27 (SUTURE) ×2
SUT VIC AB 0 CT1 27XBRD ANBCTR (SUTURE) ×1 IMPLANT
SUT VIC AB 1 CT1 18XCR BRD 8 (SUTURE) ×1 IMPLANT
SUT VIC AB 1 CT1 8-18 (SUTURE) ×2
SUT VIC AB 1 CTX 36 (SUTURE) ×4
SUT VIC AB 1 CTX36XBRD ANBCTR (SUTURE) ×2 IMPLANT
SUT VIC AB 2-0 CT1 18 (SUTURE) ×3 IMPLANT
SYR BULB IRRIGATION 50ML (SYRINGE) ×3 IMPLANT
SYR CONTROL 10ML LL (SYRINGE) ×3 IMPLANT
TOWEL OR 17X24 6PK STRL BLUE (TOWEL DISPOSABLE) ×3 IMPLANT
TOWEL OR 17X26 10 PK STRL BLUE (TOWEL DISPOSABLE) ×3 IMPLANT
WATER STERILE IRR 1000ML POUR (IV SOLUTION) ×3 IMPLANT
YANKAUER SUCT BULB TIP NO VENT (SUCTIONS) ×3 IMPLANT

## 2014-06-06 NOTE — Anesthesia Procedure Notes (Signed)
Procedure Name: Intubation Date/Time: 06/06/2014 2:06 PM Performed by: Scheryl Darter Pre-anesthesia Checklist: Patient identified, Emergency Drugs available, Suction available, Patient being monitored and Timeout performed Patient Re-evaluated:Patient Re-evaluated prior to inductionOxygen Delivery Method: Circle system utilized Preoxygenation: Pre-oxygenation with 100% oxygen Intubation Type: IV induction Ventilation: Mask ventilation without difficulty Laryngoscope Size: Mac and 3 Grade View: Grade I Tube type: Oral Number of attempts: 1 Airway Equipment and Method: Stylet Placement Confirmation: ETT inserted through vocal cords under direct vision,  positive ETCO2 and breath sounds checked- equal and bilateral Secured at: 23 cm Tube secured with: Tape Dental Injury: Teeth and Oropharynx as per pre-operative assessment

## 2014-06-06 NOTE — Anesthesia Postprocedure Evaluation (Signed)
  Anesthesia Post-op Note  Patient: Juan Wiggins  Procedure(s) Performed: Procedure(s): LEFT L4-5 DECOMPRESSION  (Left)  Patient Location: PACU  Anesthesia Type:General  Level of Consciousness: awake, alert  and oriented  Airway and Oxygen Therapy: Patient Spontanous Breathing  Post-op Pain: 3 /10  Post-op Assessment: Post-op Vital signs reviewed, Patient's Cardiovascular Status Stable, Respiratory Function Stable, Patent Airway and No signs of Nausea or vomiting  Post-op Vital Signs: Reviewed and stable  Last Vitals:  Filed Vitals:   06/06/14 1630  BP: 106/48  Pulse: 88  Temp:   Resp: 12    Complications: No apparent anesthesia complications

## 2014-06-06 NOTE — Transfer of Care (Signed)
Immediate Anesthesia Transfer of Care Note  Patient: Juan Wiggins  Procedure(s) Performed: Procedure(s): LEFT L4-5 DECOMPRESSION  (Left)  Patient Location: PACU  Anesthesia Type:General  Level of Consciousness: awake, alert , oriented and sedated  Airway & Oxygen Therapy: Patient Spontanous Breathing and Patient connected to nasal cannula oxygen  Post-op Assessment: Report given to RN, Post -op Vital signs reviewed and stable and Patient moving all extremities  Post vital signs: Reviewed and stable  Last Vitals:  Filed Vitals:   06/06/14 1151  BP: 110/66  Pulse: 96  Temp: 36.8 C  Resp: 20    Complications: No apparent anesthesia complications

## 2014-06-06 NOTE — Anesthesia Preprocedure Evaluation (Addendum)
Anesthesia Evaluation  Patient identified by MRN, date of birth, ID band Patient awake    Reviewed: Allergy & Precautions, NPO status , Patient's Chart, lab work & pertinent test results  History of Anesthesia Complications Negative for: history of anesthetic complications  Airway Mallampati: II  TM Distance: >3 FB Neck ROM: Full    Dental  (+) Poor Dentition, Dental Advisory Given   Pulmonary sleep apnea , former smoker,    Pulmonary exam normal       Cardiovascular hypertension, Pt. on medications + Peripheral Vascular Disease     Neuro/Psych PSYCHIATRIC DISORDERS Anxiety Depression negative neurological ROS     GI/Hepatic GERD-  Medicated,(+) Hepatitis -, A, C  Endo/Other  Hypothyroidism   Renal/GU      Musculoskeletal   Abdominal   Peds  Hematology   Anesthesia Other Findings   Reproductive/Obstetrics                            Anesthesia Physical Anesthesia Plan  ASA: III  Anesthesia Plan: General   Post-op Pain Management:    Induction: Intravenous  Airway Management Planned: Oral ETT  Additional Equipment:   Intra-op Plan:   Post-operative Plan: Extubation in OR  Informed Consent: I have reviewed the patients History and Physical, chart, labs and discussed the procedure including the risks, benefits and alternatives for the proposed anesthesia with the patient or authorized representative who has indicated his/her understanding and acceptance.   Dental advisory given  Plan Discussed with: CRNA, Anesthesiologist and Surgeon  Anesthesia Plan Comments:         Anesthesia Quick Evaluation

## 2014-06-06 NOTE — H&P (Signed)
History of Present Illness  The patient is a 64 year old male who comes in today for a preoperative History and Physical. The patient is scheduled for a L4-5 decompression to be performed by Dr. Duane Lope D. Rolena Infante, MD at [facility] on 06-06-14 . Please see the hospital record for complete dictated history and physical.  Allergies No Known Allergies11/16/2012  Social History Tobacco use Current some day smoker, Smokes cigars. Alcohol use Moderate alcohol use. 2-3 drinks a day  Medication History Methocarbomal Active. (prn) Furosemide (20MG  Tablet, Oral) Active. (qd) ALPRAZolam (0.5MG  Tablet, Oral) Active. OxyCODONE HCl (30MG  Tablet, Oral) Active. (#5 qd Rx'd by Pain Management) Escitalopram Oxalate (20MG  Tablet, Oral) Active. (qhs) Promethazine HCl (25MG  Tablet, Oral) Active. (prn) Hydrochlorothiazide (Oral) Specific dose unknown - Active. Ambien (10MG  Tablet, Oral) Active. (qhs) Lisinopril (10MG  Tablet, Oral) Active. (qd) Synthroid (Oral) Specific dose unknown - Active. (qd) Medications Reconciled  Past Surgical History Total Knee Replacement - Right12/16/2015  Other Problems Hypertension  Vitals 06/05/2014 3:18 PM Weight: 228 lb Height: 71.5in Body Surface Area: 2.24 m Body Mass Index: 31.36 kg/m  Temp.: 31F(Oral)  Pulse: 82 (Regular)  BP: 127/74 (Sitting, Left Arm, Standard)     Objective Transcription He is a pleasant gentleman, appears his stated age, in no acute distress. He is alert. He is oriented x3. He has no shortness of breath or chest pain. Lungs are clear to auscultation. No rubs, gallops, or murmurs. Abdomen is soft and nontender. No incontinence of bowel and bladder. He has bilateral buttock and leg pain in the hamstrings, left side worse than the right consistent with neurogenic claudication. Compartments are soft and nontender. No focal motor deficits in the lower extremity. He has dysesthesias bilaterally, left side seems  to be worse than the right. Reflexes are 2+. Negative Babinski. No clonus. Negative straight leg raise test.  RADIOGRAPHS His MRI from 05/02/2014 was reviewed. It does show the degenerative disc disease at 2-3, 3-4, and 5-1, but he has significant left sided facet arthrosis and foraminal stenosis and lateral recess stenosis at L4-L5. Although there is collapse at 2-3, there is no significant lateral recess stenosis.    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  Plans Transcription While there is degeneration at other levels, he is not having horrific back pain.  The neurogenic claudication is his  principal source of dysfunction. As again as I indicated at his last visit, we may need to consider a fusion surgery in the future if he develops worsening back pain, but at this point I would recommend proceeding with the decompression especially on that left side where he is mostly symptomatic. The risks include infection, bleeding, nerve damage, death, stroke, paralysis, failure to heal, need for further surgery, ongoing or worse pain, loss of bowel and bladder control, blood clots. All of his questions were encouraged and addressed.  Goal Of Surgery:Discussed that goal of surgery is to reduce pain and improve function and quality of life. Patient is aware that despite all appropriate treatment that there pain and function could be the same, worse, or different. Posterior Lumbar Decompression/disectomy: Risks of surgery include infection, bleeding, nerve damage, death, stroke, paralysis, failure to heal, need for further surgery, ongoing or worse pain, need for further surgery, CSF leak, loss of bowel or bladder, and recurrent disc herniation or stenosis which would necessitate  need for further  surgery.

## 2014-06-06 NOTE — Brief Op Note (Signed)
06/06/2014  4:14 PM  PATIENT:  Juan Wiggins  64 y.o. male  PRE-OPERATIVE DIAGNOSIS:  LEFT L4-5 FORAMINAL STENOSIS/FACETE ARTHROSIS  POST-OPERATIVE DIAGNOSIS:  LEFT L4-5 FORAMINAL STENOSIS/FACETE ARTHROSIS  PROCEDURE:  Procedure(s): LEFT L4-5 DECOMPRESSION  (Left)  SURGEON:  Surgeon(s) and Role:    * Melina Schools, MD - Primary  PHYSICIAN ASSISTANT:   ASSISTANTS: none   ANESTHESIA:   none  EBL:  Total I/O In: 1850 [I.V.:1600; IV Piggyback:250] Out: 150 [Blood:150]  BLOOD ADMINISTERED:none  DRAINS: none   LOCAL MEDICATIONS USED:  MARCAINE     SPECIMEN:  No Specimen  DISPOSITION OF SPECIMEN:  N/A  COUNTS:  YES  TOURNIQUET:  * No tourniquets in log *  DICTATION: .Other Dictation: Dictation Number [id number]  PLAN OF CARE: Admit to inpatient   PATIENT DISPOSITION:  PACU - hemodynamically stable.

## 2014-06-06 NOTE — Plan of Care (Signed)
Problem: Consults Goal: Diagnosis - Spinal Surgery Outcome: Completed/Met Date Met:  06/06/14 Lumbar Laminectomy (Complex)

## 2014-06-07 ENCOUNTER — Encounter (HOSPITAL_COMMUNITY): Payer: Self-pay | Admitting: Orthopedic Surgery

## 2014-06-07 MED ORDER — ONDANSETRON HCL 4 MG PO TABS: 4.0000 mg | ORAL_TABLET | Freq: Three times a day (TID) | ORAL | Status: DC | PRN

## 2014-06-07 MED ORDER — OXYCODONE HCL 30 MG PO TABS: 30.0000 mg | ORAL_TABLET | Freq: Four times a day (QID) | ORAL | Status: DC | PRN

## 2014-06-07 MED ORDER — METHOCARBAMOL 500 MG PO TABS: 500.0000 mg | ORAL_TABLET | Freq: Three times a day (TID) | ORAL | Status: DC | PRN

## 2014-06-07 NOTE — Evaluation (Signed)
Occupational Therapy Evaluation Patient Details Name: Juan Wiggins MRN: [id number] DOB: Sep 17, 1950 Today's Date: 06/07/2014    History of Present Illness Pt is a 64 y.o. Male s/p L4-5 lumbar decompression with facetectomy left side and foraminotomy L4-5 on 06/06/14. Pt has hx of bil TKA last year.    Clinical Impression   PTA pt lived at home and was independent with ADLs. Pt currently requires Supervision for functional mobility and ADLs. Pt is progressing well and all education and training complete. Acute OT to sign off.     Follow Up Recommendations  No OT follow up;Supervision - Intermittent    Equipment Recommendations  None recommended by OT    Recommendations for Other Services       Precautions / Restrictions Precautions Precautions: Back Precaution Booklet Issued: Yes (comment) Precaution Comments: Educated pt on 3/3 back precautions and incorporating into ADLs.  Required Braces or Orthoses: Spinal Brace Spinal Brace: Lumbar corset;Applied in sitting position Restrictions Weight Bearing Restrictions: No      Mobility Bed Mobility Overal bed mobility: Needs Assistance Bed Mobility: Rolling;Sidelying to Sit;Sit to Sidelying Rolling: Supervision Sidelying to sit: Supervision     Sit to sidelying: Supervision General bed mobility comments: HOB flat, no bed rail. VC's for sequencing. Supervision for safety.   Transfers Overall transfer level: Needs assistance Equipment used: None Transfers: Sit to/from Stand Sit to Stand: Supervision         General transfer comment: Supervision for safey.          ADL Overall ADL's : Needs assistance/impaired                                       General ADL Comments: Pt overall at Supervision level for functional mobility and ADLs. Pt able to apply brace independently. All education and training completed for brace wear and incorporating back precautions into ADLs. Pt is able to don underpants  and can reach Bil feet with increased time and mild difficulty due to hx of knee surgeries. Recommended wearing slide on shoes with a hard sole and back strap. Pt demonstrated ability to step over side of a tub.                Pertinent Vitals/Pain Pain Assessment: 0-10 Pain Score: 4  Pain Location: Minimal pain at rest; 7-8 with movement; 5 when standing (all surgical pain in back) Pain Descriptors / Indicators: Aching;Burning;Tender;Sore Pain Intervention(s): Monitored during session;Repositioned     Hand Dominance Right   Extremity/Trunk Assessment Upper Extremity Assessment Upper Extremity Assessment: Overall WFL for tasks assessed   Lower Extremity Assessment Lower Extremity Assessment: Defer to PT evaluation   Cervical / Trunk Assessment Cervical / Trunk Assessment: Normal   Communication Communication Communication: No difficulties   Cognition Arousal/Alertness: Awake/alert Behavior During Therapy: WFL for tasks assessed/performed Overall Cognitive Status: Within Functional Limits for tasks assessed                                Home Living Family/patient expects to be discharged to:: Private residence Living Arrangements: Parent;Other (Comment) (26 y.o. father who has 24/7 caregiver) Available Help at Discharge: Family;Personal care attendant;Available 24 hours/day (sister can assist; PCA is available to help) Type of Home: House Home Access: Level entry     Home Layout: Able to live on main level with bedroom/bathroom;Two level (  kitchen/living space is upstairs) Alternate Level Stairs-Number of Steps: 13 (flight) or 2 from front porch Alternate Level Stairs-Rails: Right;Left;Can reach both Bathroom Shower/Tub: Tub/shower unit Shower/tub characteristics: Architectural technologist: Standard     Home Equipment: Environmental consultant - 2 wheels;Shower seat;Grab bars - toilet;Grab bars - tub/shower          Prior Functioning/Environment Level of Independence:  Independent             OT Diagnosis: Generalized weakness;Acute pain    End of Session Equipment Utilized During Treatment: Back brace  Activity Tolerance: Patient tolerated treatment well Patient left: in bed;with call bell/phone within reach   Time: 0928-1000 OT Time Calculation (min): 32 min Charges:  OT General Charges $OT Visit: 1 Procedure OT Evaluation $Initial OT Evaluation Tier I: 1 Procedure OT Treatments $Self Care/Home Management : 8-22 mins G-Codes:    Juluis Rainier 2014/06/09, 10:21 AM  Cyndie Chime, OTR/L Occupational Therapist [phone] (pager)

## 2014-06-07 NOTE — Discharge Summary (Signed)
Patient ID: Juan Wiggins MRN: [id number] DOB/AGE: 62/10/1950 62 y.o.  Admit date: 06/06/2014 Discharge date: 06/07/2014  Admission Diagnoses:  Active Problems:   Back pain   Discharge Diagnoses:  Active Problems:   Back pain  status post Procedure(s): LEFT L4-5 DECOMPRESSION   Past Medical History  Diagnosis Date  . Hypertension   . Thyroid disease   . Hemorrhoids   . Hypothyroidism   . Tachycardia     PT STATES HIS HEART RATE USUALLY 100 OR MORE  . Depression   . Anxiety   . GERD (gastroesophageal reflux disease)     PREVACID IF NEEDED  . Arthritis     RHEUMATOID ARTHRITIS; OA LEFT KNEE  . 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 Sleep apnea     CLAUSTROPHOBIC - COULD NOT TOLERATE CPAP MASK  . Heart murmur   . Cancer     MELANOMA REMOVED RT SHOULDER  . Pancreatitis, alcoholic, acute     Surgeries: Procedure(s): LEFT L4-5 DECOMPRESSION  on 06/06/2014   Consultants:    Discharged Condition: Improved  Hospital Course: Juan Wiggins is an 64 y.o. male who was admitted 06/06/2014 for operative treatment of spinal stenosis. Patient failed conservative treatments (please see the history and physical for the specifics) and had severe unremitting pain that affects sleep, daily activities and work/hobbies. After pre-op clearance, the patient was taken to the operating room on 06/06/2014 and underwent  Procedure(s): LEFT L4-5 DECOMPRESSION .    Patient was given perioperative antibiotics: Anti-infectives    Start     Dose/Rate Route Frequency Ordered Stop   06/06/14 2200  ceFAZolin (ANCEF) IVPB 1 g/50 mL premix     1 g 100 mL/hr over 30 Minutes Intravenous Every 8 hours 06/06/14 1823 06/07/14 0637   06/05/14 1341  ceFAZolin (ANCEF) IVPB 2 g/50 mL premix     2 g 100 mL/hr over 30 Minutes Intravenous 30 min pre-op 06/05/14 1341 06/06/14 1404       Patient  was given sequential compression devices and early ambulation to prevent DVT.   Patient benefited maximally from hospital stay and there were no complications. At the time of discharge, the patient was urinating/moving their bowels without difficulty, tolerating a regular diet, pain is controlled with oral pain medications and they have been cleared by PT/OT.   Recent vital signs: Patient Vitals for the past 24 hrs:  BP Temp Temp src Pulse Resp SpO2  06/07/14 1150 (!) 146/74 mmHg 98.3 F (36.8 C) - 88 18 96 %  06/07/14 0823 (!) 141/81 mmHg 98.4 F (36.9 C) - 91 18 97 %  06/07/14 0400 (!) 118/52 mmHg 98.9 F (37.2 C) Oral 84 18 94 %  06/07/14 0000 (!) 111/53 mmHg 98.6 F (37 C) Oral 81 18 95 %  06/06/14 2000 128/66 mmHg 98 F (36.7 C) Oral 84 20 99 %  06/06/14 1823 112/64 mmHg 97.8 F (36.6 C) Oral 89 16 97 %  06/06/14 1745 110/64 mmHg 98 F (36.7 C) - (!) 111 13 98 %  06/06/14 1730 108/62 mmHg - - (!) 106 11 99 %  06/06/14 1715 109/63 mmHg - - 86 12 93 %  06/06/14 1700 (!) 102/59 mmHg - - 91 12 90 %  06/06/14 1645 122/73 mmHg - - 90 14 96 %  06/06/14 1630 (!) 106/48 mmHg 98 F (36.7 C) - 88 12 93 %     Recent laboratory studies:  Recent Labs  06/06/14 1202  WBC 8.3  HGB 12.8*  HCT 37.8*  PLT 385  NA 137  K 4.4  CL 102  CO2 24  BUN 8  CREATININE 1.22  GLUCOSE 118*  CALCIUM 9.2     Discharge Medications:     Medication List    TAKE these medications        ALPRAZolam 0.25 MG tablet  Commonly known as:  XANAX  Take 0.25 mg by mouth 3 (three) times daily as needed. anxiety     docusate sodium 100 MG capsule  Commonly known as:  COLACE  Take 1 capsule (100 mg total) by mouth 2 (two) times daily.     escitalopram 20 MG tablet  Commonly known as:  LEXAPRO  Take 20 mg by mouth every morning.     folic acid 1 MG tablet  Commonly known as:  FOLVITE  Take 1 tablet (1 mg total) by mouth daily.     lansoprazole 15 MG capsule  Commonly known as:  PREVACID    Take 15 mg by mouth daily.     levothyroxine 175 MCG tablet  Commonly known as:  SYNTHROID, LEVOTHROID  Take 175 mcg by mouth daily before breakfast.     lisinopril 10 MG tablet  Commonly known as:  PRINIVIL,ZESTRIL  Take 10 mg by mouth every morning.     LORazepam 1 MG tablet  Commonly known as:  ATIVAN  Take 1 tablet (1 mg total) by mouth every 6 (six) hours as needed (CIWA-AR > 8  -OR-  withdrawal symptoms:  anxiety, agitation, insomnia, diaphoresis, nausea, vomiting, tremors, tachycardia, or hypertension.).     methocarbamol 500 MG tablet  Commonly known as:  ROBAXIN  Take 1 tablet (500 mg total) by mouth 3 (three) times daily as needed for muscle spasms.     OLANZapine-FLUoxetine 12-50 MG per capsule  Commonly known as:  SYMBYAX  Take 1 capsule by mouth at bedtime.     ondansetron 4 MG tablet  Commonly known as:  ZOFRAN  Take 1 tablet (4 mg total) by mouth every 8 (eight) hours as needed for nausea or vomiting.     OVER THE COUNTER MEDICATION  Place into both eyes daily as needed (contacts.). Flushes eyes     oxycodone 30 MG immediate release tablet  Commonly known as:  ROXICODONE  Take 1 tablet (30 mg total) by mouth every 6 (six) hours as needed for severe pain.     zolpidem 10 MG tablet  Commonly known as:  AMBIEN  Take 10 mg by mouth at bedtime as needed for sleep.        Diagnostic Studies: Ct Abdomen Pelvis W Contrast  05/21/2014   CLINICAL DATA:  Mid abdominal pain with distension. Nausea, vomiting, and diarrhea for 48 hours.  EXAM: CT ABDOMEN AND PELVIS WITH CONTRAST  TECHNIQUE: Multidetector CT imaging of the abdomen and pelvis was performed using the standard protocol following bolus administration of intravenous contrast.  CONTRAST:  38mL OMNIPAQUE IOHEXOL 300 MG/ML SOLN, 135mL OMNIPAQUE IOHEXOL 300 MG/ML SOLN  COMPARISON:  06/24/2009  FINDINGS: BODY WALL: Asymmetric appearance of the right inguinal canal suggests remote hernia repair. There is a probable  small fatty left inguinal hernia.  LOWER CHEST: Small sliding hiatal hernia.  ABDOMEN/PELVIS:  Liver: Marked fatty infiltration.  Biliary: No evidence of biliary obstruction or stone.  Pancreas: Peripancreatic edema compatible with pancreatitis. No evidence of necrosis or fluid collection. No vascular compromise.  Spleen: Unremarkable.  Adrenals: Unremarkable.  Kidneys and ureters: Left renal cysts, the largest in the lower pole at 4 cm with layering milk of calcium.  Bladder: Unremarkable.  Reproductive: No pathologic findings.  Bowel: No obstruction. Normal appendix.  Retroperitoneum: Mild retroperitoneal edema likely related to the pancreas.  Peritoneum: Small volume pelvic ascites, reactive.  Vascular: No acute abnormality.  OSSEOUS: Focally advanced L2-3 degenerative disc disease. Lower lumbar facet arthropathy, especially at L4-5 and L5-S1.  IMPRESSION: 1. Pancreatitis without fluid collection. 2. Hepatic steatosis.   Electronically Signed   By: Monte Fantasia M.D.   On: 05/21/2014 13:28   Dg Lumbar Spine 1 View  06/06/2014   CLINICAL DATA:  Lumbar disc protrusion.  EXAM: DG C-ARM 61-120 MIN; LUMBAR SPINE - 1 VIEW  COMPARISON:  None  FINDINGS: Intraoperative cross-table lateral radiograph shows a probe posteriorly overlying the posterior interspinous space at the level of L4-5.  IMPRESSION: Intraoperative localization of L4-5.   Electronically Signed   By: Earle Gell M.D.   On: 06/06/2014 15:47   Dg C-arm 1-60 Min  06/06/2014   CLINICAL DATA:  Lumbar disc protrusion.  EXAM: DG C-ARM 61-120 MIN; LUMBAR SPINE - 1 VIEW  COMPARISON:  None  FINDINGS: Intraoperative cross-table lateral radiograph shows a probe posteriorly overlying the posterior interspinous space at the level of L4-5.  IMPRESSION: Intraoperative localization of L4-5.   Electronically Signed   By: Earle Gell M.D.   On: 06/06/2014 15:47          Follow-up Information    Follow up with Dahlia Bailiff, MD. Schedule an appointment as  soon as possible for a visit in 2 weeks.   Specialty:  Orthopedic Surgery   Why:  For suture removal, For wound re-check   Contact information:   [address] [place] [address] [phone]       Discharge Plan:  discharge to home  Disposition: hospital course: doing well. Pain well controlled with oral medications.  Neuro exam improved - radicular leg pain resolved.   Ok for d/c to home - plan on f/u in 2 weeks.    Signed: Melina Schools D for Dr. Melina Schools [facility] [phone] 06/07/2014, 12:42 PM

## 2014-06-07 NOTE — Progress Notes (Signed)
    Subjective: Procedure(s) (LRB): LEFT L4-5 DECOMPRESSION  (Left) 1 Day Post-Op  Patient reports pain as 2 on 0-10 scale.  Reports decreased leg pain reports incisional back pain   Positive void Negative bowel movement Positive flatus Negative chest pain or shortness of breath  Objective: Vital signs in last 24 hours: Temp:  [97.8 F (36.6 C)-98.9 F (37.2 C)] 98.3 F (36.8 C) (04/14 1150) Pulse Rate:  [81-111] 88 (04/14 1150) Resp:  [11-20] 18 (04/14 1150) BP: (102-146)/(48-81) 146/74 mmHg (04/14 1150) SpO2:  [90 %-99 %] 96 % (04/14 1150)  Intake/Output from previous day: 04/13 0701 - 04/14 0700 In: 2350 [I.V.:2100; IV Piggyback:250] Out: 1150 [Urine:1000; Blood:150]  Labs:  Recent Labs  06/06/14 1202  WBC 8.3  RBC 3.80*  HCT 37.8*  PLT 385    Recent Labs  06/06/14 1202  NA 137  K 4.4  CL 102  CO2 24  BUN 8  CREATININE 1.22  GLUCOSE 118*  CALCIUM 9.2   No results for input(s): LABPT, INR in the last 72 hours.  Physical Exam: Neurologically intact ABD soft Intact pulses distally Incision: dressing C/D/I and no drainage Compartment soft  Assessment/Plan: Patient stable  xrays n/a Continue mobilization with physical therapy Continue care  Advance diet Up with therapy  Doing well - ok for d/c to home  Melina Schools, [place] [phone]

## 2014-06-07 NOTE — Progress Notes (Signed)
PT Cancellation and Discharge Note  Patient Details Name: Juan Wiggins MRN: [id number] DOB: Mar 21, 1950   Cancelled Treatment:    Reason Eval/Treat Not Completed: PT screened, no needs identified, will sign off.  Pt up ambulating independently and per Nsg and OT, pt doing very well.  Spoke with pt about mobility and feel there are no acute PT needs at this time.  Will sign off.     Kolbee Stallman, Thornton Papas 06/07/2014, 2:25 PM

## 2014-06-07 NOTE — Op Note (Signed)
NAME:  Juan Wiggins, Juan Wiggins NO.:  [id number]  MEDICAL RECORD NO.:  [id number]  LOCATION:  [id number]                        FACILITY:  [facility]  PHYSICIAN:  Dahlia Bailiff, MD    DATE OF BIRTH:  [birth year]  DATE OF PROCEDURE:  06/06/2014 DATE OF DISCHARGE:                              OPERATIVE REPORT   PREOPERATIVE DIAGNOSIS:  Lumbar spinal stenosis with anterior listhesis, L4-5.  POSTOPERATIVE DIAGNOSIS:  Lumbar spinal stenosis with anterior listhesis, L4-5.  OPERATIVE PROCEDURE:  L4-5 lumbar decompression with a subtotal facetectomy left side and foraminotomy L4-5.  COMPLICATIONS:  None.  CONDITION:  Stable.  HISTORY:  This is a very pleasant 64 year old gentleman has had longstanding back pain which he did not tolerate.  Over the last several months, he has been having progressive neuropathic leg pain left side worse than the right.  Imaging studies confirmed foraminal and lateral recess stenosis with compression of the traversing/exiting L5 nerve root.  Attempts at conservative management had failed to alleviate his symptoms.  As a result, we elected to proceed with a lumbar decompression to alleviate the neuropathic leg pain.  All appropriate risks, benefits, and alternatives were discussed with the patient and consent was obtained.  OPERATIVE NOTE:  The patient was brought to the operating room, placed supine on the operating table.  After successful induction of general anesthesia and endotracheal intubation, TEDs and SCDs were applied.  The patient was then turned prone onto the Wilson frame and all bony prominences were well padded.  The back was then prepped and draped in a standard fashion.  Time-out was taken to confirm patient, procedure, and all other pertinent important data.  Once this was completed, I then placed needles into the back and took an x-ray confirming the L4-5 level.  I then marked the midline and then infiltrated the  proposed incision site with 0.25% Marcaine with epinephrine.  Once this was done, a midline incision was made and centered over the L4-5 disk space. Sharp dissection was carried out down to the deep fascia.  The deep fascia was sharply incised and I exposed the L5 and the L4 spinous process.  I then exposed the lamina.  At this point, I noted the L4-5 facet complex to have large synovial cysts especially on the left-hand side.  I exposed the lateral aspect of the facet complex and then placed the self-retaining retractor blade in place.  I placed a Penfield 4 underneath the ligamentum flavum of L4 and then took an intraoperative x- ray to confirm that I was at the appropriate level.  Once this was done, I then removed the synovial cyst from L4-5.  Once this was done, I then removed the superior aspect of the L5 spinous process and the inferior aspect of the L4.  I then noted there was significant inflamed adenomatous ligamentum flavum tissue.  I then used Penfield 4 to dissect through this and then a 3, 2 mm Kerrison to remove it.  I then eventually identified the underlying ligamentum flavum.  I then developed a plane between the ligamentum flavum and the thecal sac and then used my 2 and 3 mm Kerrison to perform a  central decompression.  I proceeded superiorly performing a generous L4 laminotomy.  I then developed a plane underneath the L5 superior edge, placed a neural patty and then resected the superior edge of the L5 lamina.  At this point, I had an excellent central decompression.  I then developed my plane into the lateral recess.  The left side still had significant neural compression.  I dissected using a Penfield 4, along the lateral aspect of the thecal sac until I was in the lateral recess.  I then traced down until I could identify the L5 pedicle.  This allowed me to identify the L5 nerve root.  There was marked facet arthrosis causing lateral recess compression stenosis.  I  used a 2-mm Kerrison to resect the leading edge of the L5 superior facet until it was flushed to the L5 pedicle.  I then removed the majority of the L4 inferior facet as well facet in order to visualize the lateral recess.  At this point, I could now take my [facility] elevator to palpate and visualize the medial border of the L5 pedicle and trace the L5 nerve root, visualize the L5 nerve root going into the L5 foramen.  I could freely pass my [facility] along the nerve root and out the L5 foramen and superiorly and palpate the L4 pedicle.  At this point, I had a complete lateral recess decompression and foraminotomy.  I then went to the contralateral side and performed a similar decompression.  I did not do any as extensive facet takedown to prevent further instability. In addition, on his preoperative MRI, there was not as much facet lateral recess stenosis nor did he have significant neuropathic right leg pain.  At this point, I could freely put my [facility] elevator inferiorly along the L5 nerve root into the 5 foramen and medially along the medial pedicle and superiorly to the L4 pedicle.  At this point, I was pleased with the decompression.  I irrigated the wound copiously with normal saline, and then using FloSeal and bipolar electrocautery, obtained and maintained hemostasis.  I then placed a thrombin-soaked Gelfoam patty over the exposed thecal sac and closed the deep fascia with interrupted #1 Vicryl sutures.  The superficial was closed with 2-0 Vicryl sutures and 3-0 Monocryl for the skin.  Steri-Strips and dry dressing were applied.  The patient was ultimately extubated, transferred to the PACU without incident.  At the end of the case, all needle and sponge counts were correct.  There were no adverse intraoperative events.     Dahlia Bailiff, MD     DDB/MEDQ  D:  06/06/2014  T:  06/07/2014  Job:  [id number]

## 2014-06-07 NOTE — Progress Notes (Signed)
Pt doing well. Pt and family given D/C instructions with Rx's, verbal understanding was provided. Pt's incision is clean and dry with no sign of infection. Pt's IV was removed prior to D/C. Pt D/C'd home via wheelchair @ 1440 per MD order. Pt is stable @ D/C and has no other needs at this time. Holli Humbles, RN

## 2014-10-22 ENCOUNTER — Emergency Department (HOSPITAL_COMMUNITY): Payer: 59

## 2014-10-22 ENCOUNTER — Emergency Department (HOSPITAL_COMMUNITY)
Admission: EM | Admit: 2014-10-22 | Discharge: 2014-10-22 | Disposition: A | Discharge status: Home / Self Care | Payer: 59 | Attending: Emergency Medicine | Admitting: Emergency Medicine

## 2014-10-22 ENCOUNTER — Encounter (HOSPITAL_COMMUNITY): Payer: Self-pay | Admitting: Emergency Medicine

## 2014-10-22 DIAGNOSIS — F419 Anxiety disorder, unspecified: Secondary | ICD-10-CM | POA: Insufficient documentation

## 2014-10-22 DIAGNOSIS — K219 Gastro-esophageal reflux disease without esophagitis: Secondary | ICD-10-CM | POA: Insufficient documentation

## 2014-10-22 DIAGNOSIS — Z87891 Personal history of nicotine dependence: Secondary | ICD-10-CM | POA: Diagnosis not present

## 2014-10-22 DIAGNOSIS — R011 Cardiac murmur, unspecified: Secondary | ICD-10-CM | POA: Diagnosis not present

## 2014-10-22 DIAGNOSIS — R140 Abdominal distension (gaseous): Secondary | ICD-10-CM | POA: Diagnosis not present

## 2014-10-22 DIAGNOSIS — Z8669 Personal history of other diseases of the nervous system and sense organs: Secondary | ICD-10-CM | POA: Diagnosis not present

## 2014-10-22 DIAGNOSIS — Z79899 Other long term (current) drug therapy: Secondary | ICD-10-CM | POA: Diagnosis not present

## 2014-10-22 DIAGNOSIS — R1084 Generalized abdominal pain: Secondary | ICD-10-CM | POA: Diagnosis not present

## 2014-10-22 DIAGNOSIS — Z85820 Personal history of malignant melanoma of skin: Secondary | ICD-10-CM | POA: Diagnosis not present

## 2014-10-22 DIAGNOSIS — R197 Diarrhea, unspecified: Secondary | ICD-10-CM | POA: Insufficient documentation

## 2014-10-22 DIAGNOSIS — E039 Hypothyroidism, unspecified: Secondary | ICD-10-CM | POA: Insufficient documentation

## 2014-10-22 DIAGNOSIS — K5900 Constipation, unspecified: Secondary | ICD-10-CM | POA: Insufficient documentation

## 2014-10-22 DIAGNOSIS — M069 Rheumatoid arthritis, unspecified: Secondary | ICD-10-CM | POA: Diagnosis not present

## 2014-10-22 DIAGNOSIS — R1030 Lower abdominal pain, unspecified: Secondary | ICD-10-CM | POA: Diagnosis present

## 2014-10-22 DIAGNOSIS — F329 Major depressive disorder, single episode, unspecified: Secondary | ICD-10-CM | POA: Diagnosis not present

## 2014-10-22 DIAGNOSIS — Z8619 Personal history of other infectious and parasitic diseases: Secondary | ICD-10-CM | POA: Diagnosis not present

## 2014-10-22 DIAGNOSIS — R112 Nausea with vomiting, unspecified: Secondary | ICD-10-CM | POA: Diagnosis not present

## 2014-10-22 DIAGNOSIS — I10 Essential (primary) hypertension: Secondary | ICD-10-CM | POA: Insufficient documentation

## 2014-10-22 LAB — COMPREHENSIVE METABOLIC PANEL
ALK PHOS: 77 U/L (ref 38–126)
ALT: 89 U/L — AB (ref 17–63)
AST: 100 U/L — ABNORMAL HIGH (ref 15–41)
Albumin: 3.7 g/dL (ref 3.5–5.0)
Anion gap: 14 (ref 5–15)
BUN: 16 mg/dL (ref 6–20)
CALCIUM: 9.2 mg/dL (ref 8.9–10.3)
CHLORIDE: 104 mmol/L (ref 101–111)
CO2: 18 mmol/L — ABNORMAL LOW (ref 22–32)
Creatinine, Ser: 1.23 mg/dL (ref 0.61–1.24)
GFR calc Af Amer: >60 mL/min (ref >60)
GFR calc non Af Amer: >60 mL/min (ref >60)
GLUCOSE: 141 mg/dL — AB (ref 65–99)
Potassium: 4.1 mmol/L (ref 3.5–5.1)
Sodium: 136 mmol/L (ref 135–145)
Total Bilirubin: 0.6 mg/dL (ref 0.3–1.2)
Total Protein: 7.1 g/dL (ref 6.5–8.1)

## 2014-10-22 LAB — URINALYSIS, ROUTINE W REFLEX MICROSCOPIC
Bilirubin Urine: NEGATIVE
GLUCOSE, UA: NEGATIVE mg/dL
Hgb urine dipstick: NEGATIVE
Ketones, ur: NEGATIVE mg/dL
Leukocytes, UA: NEGATIVE
Nitrite: NEGATIVE
Protein, ur: NEGATIVE mg/dL
SPECIFIC GRAVITY, URINE: 1.007 (ref 1.005–1.030)
Urobilinogen, UA: 0.2 mg/dL (ref 0.0–1.0)
pH: 6 (ref 5.0–8.0)

## 2014-10-22 LAB — CBC
HEMATOCRIT: 38.8 % — AB (ref 39.0–52.0)
HEMOGLOBIN: 13.1 g/dL (ref 13.0–17.0)
MCH: 35.1 pg — AB (ref 26.0–34.0)
MCHC: 33.8 g/dL (ref 30.0–36.0)
MCV: 104 fL — AB (ref 78.0–100.0)
Platelets: 247 10*3/uL (ref 150–400)
RBC: 3.73 MIL/uL — ABNORMAL LOW (ref 4.22–5.81)
RDW: 14.1 % (ref 11.5–15.5)
WBC: 6.5 10*3/uL (ref 4.0–10.5)

## 2014-10-22 LAB — LIPASE, BLOOD

## 2014-10-22 MED ORDER — IOHEXOL 300 MG/ML  SOLN
100.0000 mL | Freq: Once | INTRAMUSCULAR | Status: AC | PRN
  Administered 2014-10-22: 100 mL via INTRAVENOUS

## 2014-10-22 MED ORDER — SODIUM CHLORIDE 0.9 % IV BOLUS (SEPSIS)
1000.0000 mL | Freq: Once | INTRAVENOUS | Status: AC
  Administered 2014-10-22: 1000 mL via INTRAVENOUS

## 2014-10-22 MED ORDER — ONDANSETRON HCL 4 MG/2ML IJ SOLN
4.0000 mg | Freq: Once | INTRAMUSCULAR | Status: AC
  Administered 2014-10-22: 4 mg via INTRAVENOUS
  Filled 2014-10-22: qty 2

## 2014-10-22 MED ORDER — IOHEXOL 300 MG/ML  SOLN
50.0000 mL | Freq: Once | INTRAMUSCULAR | Status: AC | PRN
  Administered 2014-10-22: 50 mL via ORAL

## 2014-10-22 MED ORDER — DICYCLOMINE HCL 20 MG PO TABS: 20.0000 mg | ORAL_TABLET | Freq: Two times a day (BID) | ORAL | Status: DC

## 2014-10-22 NOTE — ED Notes (Signed)
Pt A+ox4, reports c/o diffuse abd pain "mostly in the lower part".  With vomiting "every morning".  Pt reports hx of pancreatitis and "it feels the same".  "Sometimes fevers and diarrhea and constipation."  Speaking full/clear sentences, rr even/un-lab.  Skin PWD.  MAEI, ambulatory with steady gait.  NAD.

## 2014-10-22 NOTE — ED Notes (Signed)
Pt in CT.

## 2014-10-22 NOTE — Discharge Instructions (Signed)
Your CT scan is normal today. Start watching your diet and exercise regularly. Take Bentyl as prescribed for possible intestinal spasms. Follow-up with primary care doctor for further evaluation and treatment.    Abdominal Pain Many things can cause abdominal pain. Usually, abdominal pain is not caused by a disease and will improve without treatment. It can often be observed and treated at home. Your health care provider will do a physical exam and possibly order blood tests and X-rays to help determine the seriousness of your pain. However, in many cases, more time must pass before a clear cause of the pain can be found. Before that point, your health care provider may not know if you need more testing or further treatment. HOME CARE INSTRUCTIONS  Monitor your abdominal pain for any changes. The following actions may help to alleviate any discomfort you are experiencing:  Only take over-the-counter or prescription medicines as directed by your health care provider.  Do not take laxatives unless directed to do so by your health care provider.  Try a clear liquid diet (broth, tea, or water) as directed by your health care provider. Slowly move to a bland diet as tolerated. SEEK MEDICAL CARE IF:  You have unexplained abdominal pain.  You have abdominal pain associated with nausea or diarrhea.  You have pain when you urinate or have a bowel movement.  You experience abdominal pain that wakes you in the night.  You have abdominal pain that is worsened or improved by eating food.  You have abdominal pain that is worsened with eating fatty foods.  You have a fever. SEEK IMMEDIATE MEDICAL CARE IF:   Your pain does not go away within 2 hours.  You keep throwing up (vomiting).  Your pain is felt only in portions of the abdomen, such as the right side or the left lower portion of the abdomen.  You pass bloody or black tarry stools. MAKE SURE YOU:  Understand these instructions.    Will watch your condition.   Will get help right away if you are not doing well or get worse.  Document Released: 11/19/2004 Document Revised: 02/14/2013 Document Reviewed: 10/19/2012 [facility] Patient Information 2015 [place], [place]. This information is not intended to replace advice given to you by your health care provider. Make sure you discuss any questions you have with your health care provider.

## 2014-10-22 NOTE — ED Notes (Signed)
Patient transported to X-ray 

## 2014-10-22 NOTE — ED Notes (Signed)
Patient transported to CT 

## 2014-10-22 NOTE — ED Provider Notes (Signed)
CSN: [id number]     Arrival date & time 10/22/14  1013 History   First MD Initiated Contact with Patient 10/22/14 1051     Chief Complaint  Patient presents with  . Abdominal Pain    lower abd pain, "feels like pancreatitis", hx of same, x2 weeks  . Nausea    with vomiting "every morning"     (Consider location/radiation/quality/duration/timing/severity/associated sxs/prior Treatment) HPI SAFAL HALDERMAN is a 64 y.o. male with history of hypertension, thyroid disease, pancreatitis, hepatitis A, presents to emergency department complaining of abdominal pain. Patient states his pain started a week ago has gradually been worsening since. He reports associated nausea and vomiting. He states intermittent constipation and diarrhea. He has been taking relax which has helped some. He reported history of pancreatitis and states this feels the same. He states his abdomen feels swollen and has been like this for about 2 weeks. Patient is a daily drinker, states he used to drink heavily but now cut down to 2 drinks per day. He denies any fever or chills. Denies any blood in his stool or emesis. Denies any urinary symptoms. No medication taken prior to coming in on his regular medications.  Past Medical History  Diagnosis Date  . Hypertension   . Thyroid disease   . Hemorrhoids   . Hypothyroidism   . Tachycardia     PT STATES HIS HEART RATE USUALLY 100 OR MORE  . Depression   . Anxiety   . GERD (gastroesophageal reflux disease)     PREVACID IF NEEDED  . Arthritis     RHEUMATOID ARTHRITIS; OA LEFT KNEE  . 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 Sleep apnea     CLAUSTROPHOBIC - COULD NOT TOLERATE CPAP MASK  . Heart murmur   . Cancer     MELANOMA REMOVED RT SHOULDER  . Pancreatitis, alcoholic, acute    Past Surgical History  Procedure Laterality Date  . Knee surgery      BILATERAL  KNEE ARTHROSCOPY  . Shoulder surgery      RIGHT ROTATOR CUFF REPAIR AND LEFT ARTHROSCOPY  . Elbow surgery      BILATERAL ELBOW SURGERY  . Refractive surgery    . Total knee arthroplasty Left 08/23/2013    Procedure: LEFT TOTAL KNEE ARTHROPLASTY;  Surgeon: Tobi Bastos, MD;  Location: [facility];  Service: Orthopedics;  Laterality: Left;  . Colonscopy     . Left tkr      July 2015  . Total knee arthroplasty Right 02/07/2014    Procedure: RIGHT TOTAL KNEE ARTHROPLASTY;  Surgeon: Tobi Bastos, MD;  Location: [facility];  Service: Orthopedics;  Laterality: Right;  . Lumbar laminectomy/decompression microdiscectomy Left 06/06/2014    Procedure: LEFT L4-5 DECOMPRESSION ;  Surgeon: Melina Schools, MD;  Location: [place];  Service: Orthopedics;  Laterality: Left;   Family History  Problem Relation Age of Onset  . Hypertension Mother   . Other Mother     varicose veins  . Hypertension Sister    Social History  Substance Use Topics  . Smoking status: Former Smoker -- 1.50 packs/day for 35 years    Types: Cigars    Quit date: 02/22/1998  . Smokeless tobacco: Never Used     Comment: used cigarettes 20 years ago  . Alcohol Use: No  Comment: " last alcohol use was in December 2015."    Review of Systems  Constitutional: Negative for fever and chills.  Respiratory: Negative for cough, chest tightness and shortness of breath.   Cardiovascular: Negative for chest pain, palpitations and leg swelling.  Gastrointestinal: Positive for nausea, vomiting, abdominal pain, diarrhea, constipation and abdominal distention. Negative for blood in stool, anal bleeding and rectal pain.  Genitourinary: Negative for dysuria, urgency, frequency and hematuria.  Musculoskeletal: Negative for myalgias, arthralgias, neck pain and neck stiffness.  Skin: Negative for rash.  Allergic/Immunologic: Negative for immunocompromised state.  Neurological: Negative for dizziness, weakness, light-headedness, numbness and  headaches.  All other systems reviewed and are negative.     Allergies  Review of patient's allergies indicates no known allergies.  Home Medications   Prior to Admission medications   Medication Sig Start Date End Date Taking? Authorizing Provider  ALPRAZolam (XANAX) 0.25 MG tablet Take 0.25 mg by mouth at bedtime as needed. anxiety 04/23/14  Yes Historical Provider, MD  cholecalciferol (VITAMIN D) 1000 UNITS tablet Take 3,000 Units by mouth daily.   Yes Historical Provider, MD  escitalopram (LEXAPRO) 20 MG tablet Take 20 mg by mouth every morning.   Yes Historical Provider, MD  HYDROcodone-acetaminophen (NORCO/VICODIN) 5-325 MG per tablet Take 1 tablet by mouth every 8 (eight) hours as needed for moderate pain or severe pain.   Yes Historical Provider, MD  lansoprazole (PREVACID) 15 MG capsule Take 15 mg by mouth daily as needed (heartburn).    Yes Historical Provider, MD  levothyroxine (SYNTHROID, LEVOTHROID) 175 MCG tablet Take 175 mcg by mouth daily before breakfast.   Yes Historical Provider, MD  lisinopril (PRINIVIL,ZESTRIL) 10 MG tablet Take 20 mg by mouth every morning.    Yes Historical Provider, MD  OLANZapine-FLUoxetine (SYMBYAX) 12-50 MG per capsule Take 1 capsule by mouth at bedtime.   Yes Historical Provider, MD  OVER THE COUNTER MEDICATION Place into both eyes daily as needed (contacts.). Flushes eyes   Yes Historical Provider, MD  OxyCODONE ER (XTAMPZA ER) 27 MG C12A Take 27 mg by mouth every 12 (twelve) hours.   Yes Historical Provider, MD  zolpidem (AMBIEN) 10 MG tablet Take 10 mg by mouth at bedtime as needed for sleep.   Yes Historical Provider, MD  docusate sodium (COLACE) 100 MG capsule Take 1 capsule (100 mg total) by mouth 2 (two) times daily. Patient not taking: Reported on 10/22/2014 02/09/14   Ardeen Jourdain, PA-C  folic acid (FOLVITE) 1 MG tablet Take 1 tablet (1 mg total) by mouth daily. Patient not taking: Reported on 06/04/2014 05/23/14   Robbie Lis, MD   LORazepam (ATIVAN) 1 MG tablet Take 1 tablet (1 mg total) by mouth every 6 (six) hours as needed (CIWA-AR > 8  -OR-  withdrawal symptoms:  anxiety, agitation, insomnia, diaphoresis, nausea, vomiting, tremors, tachycardia, or hypertension.). Patient not taking: Reported on 10/22/2014 05/23/14   Robbie Lis, MD  methocarbamol (ROBAXIN) 500 MG tablet Take 1 tablet (500 mg total) by mouth 3 (three) times daily as needed for muscle spasms. Patient not taking: Reported on 10/22/2014 06/07/14   Melina Schools, MD  ondansetron (ZOFRAN) 4 MG tablet Take 1 tablet (4 mg total) by mouth every 8 (eight) hours as needed for nausea or vomiting. Patient not taking: Reported on 10/22/2014 06/07/14   Melina Schools, MD  oxyCODONE (ROXICODONE) 30 MG immediate release tablet Take 1 tablet (30 mg total) by mouth every 6 (six) hours as needed for severe pain.  Patient not taking: Reported on 10/22/2014 06/07/14   Melina Schools, MD   BP 124/71 mmHg  Pulse 101  Temp(Src) 98.4 F (36.9 C) (Oral)  Resp 18  Ht 5\' 11"  (1.803 m)  Wt 242 lb (109.77 kg)  BMI 33.77 kg/m2  SpO2 96% Physical Exam  Constitutional: He appears well-developed and well-nourished. No distress.  HENT:  Head: Normocephalic and atraumatic.  Eyes: Conjunctivae are normal.  Neck: Neck supple.  Cardiovascular: Normal rate, regular rhythm and normal heart sounds.   Pulmonary/Chest: Effort normal. No respiratory distress. He has no wheezes. He has no rales.  Abdominal: Soft. Bowel sounds are normal. He exhibits no distension. There is tenderness. There is no rebound.  Diffuse tenderness worse in LUQ and LLQ  Musculoskeletal: He exhibits no edema.  Neurological: He is alert.  Skin: Skin is warm and dry.  Nursing note and vitals reviewed.   ED Course  Procedures (including critical care time) Labs Review Labs Reviewed  LIPASE, BLOOD - Abnormal; Notable for the following:    Lipase <10 (*)    All other components within normal limits  COMPREHENSIVE  METABOLIC PANEL - Abnormal; Notable for the following:    CO2 18 (*)    Glucose, Bld 141 (*)    AST 100 (*)    ALT 89 (*)    All other components within normal limits  CBC - Abnormal; Notable for the following:    RBC 3.73 (*)    HCT 38.8 (*)    MCV 104.0 (*)    MCH 35.1 (*)    All other components within normal limits  URINALYSIS, ROUTINE W REFLEX MICROSCOPIC (NOT AT [facility])    Imaging Review Dg Abd Acute W/chest  10/22/2014   CLINICAL DATA:  One week history of shortness of breath, bloating, vomiting and diarrhea.  EXAM: DG ABDOMEN ACUTE W/ 1V CHEST  COMPARISON:  CT scan 05/21/2014  FINDINGS: The upright chest x-ray is unremarkable. There is mild apical scarring with mild pleural thickening and calcification. No infiltrates, edema or effusions. The heart size is normal.  Two views of the abdomen demonstrate an unremarkable bowel gas pattern. No findings for obstruction an or perforation. The soft tissue shadows are maintained. No worrisome calcifications. The bony structures are intact.  IMPRESSION: No acute cardiopulmonary findings.  No plain film findings for an acute abdominal process.   Electronically Signed   By: Marijo Sanes M.D.   On: 10/22/2014 12:31   I have personally reviewed and evaluated these images and lab results as part of my medical decision-making.   EKG Interpretation None      MDM   Final diagnoses:  Generalized abdominal pain    Patient seen and examined. Patient with abdominal distention, nausea, vomiting, intermittent constipation and diarrhea. Patient states this feels like prior episodes of pancreatitis. Patient is a daily drinker, drinks 2 drinks a day. Will get labs, acute abdomen series, IV fluids started.  12:56 PM Labs including lipase and urinalysis unremarkable. Patient continues to have pain. Will get CT abdomen and pelvis for further evaluation.  3:38 PM CT is negative. Patient is aware of the left kidney cyst. Patient is feeling better. We'll  discharge home with outpatient follow-up. We'll try Bentyl for possible intestinal spasms. At this time vital signs are normal, patient stable for discharge home.   Filed Vitals:   10/22/14 1028 10/22/14 1308  BP: 124/71 149/74  Pulse: 101 84  Temp: 98.4 F (36.9 C) 97.7 F (36.5 C)  TempSrc:  Oral Oral  Resp: 18 18  Height: 5\' 11"  (1.803 m)   Weight: 242 lb (109.77 kg)   SpO2: 96% 96%     Jeannett Senior, PA-C 10/22/14 1544  Leo Grosser, MD 10/23/14 [phone]

## 2014-12-01 ENCOUNTER — Emergency Department (HOSPITAL_COMMUNITY)
Admission: EM | Admit: 2014-12-01 | Discharge: 2014-12-01 | Disposition: A | Discharge status: Home / Self Care | Payer: 59 | Attending: Emergency Medicine | Admitting: Emergency Medicine

## 2014-12-01 ENCOUNTER — Encounter (HOSPITAL_COMMUNITY): Payer: Self-pay

## 2014-12-01 ENCOUNTER — Emergency Department (HOSPITAL_COMMUNITY): Payer: 59

## 2014-12-01 DIAGNOSIS — R52 Pain, unspecified: Secondary | ICD-10-CM

## 2014-12-01 DIAGNOSIS — R011 Cardiac murmur, unspecified: Secondary | ICD-10-CM | POA: Insufficient documentation

## 2014-12-01 DIAGNOSIS — K219 Gastro-esophageal reflux disease without esophagitis: Secondary | ICD-10-CM | POA: Insufficient documentation

## 2014-12-01 DIAGNOSIS — Z8619 Personal history of other infectious and parasitic diseases: Secondary | ICD-10-CM | POA: Insufficient documentation

## 2014-12-01 DIAGNOSIS — F419 Anxiety disorder, unspecified: Secondary | ICD-10-CM | POA: Diagnosis not present

## 2014-12-01 DIAGNOSIS — Z79899 Other long term (current) drug therapy: Secondary | ICD-10-CM | POA: Diagnosis not present

## 2014-12-01 DIAGNOSIS — Z85820 Personal history of malignant melanoma of skin: Secondary | ICD-10-CM | POA: Insufficient documentation

## 2014-12-01 DIAGNOSIS — F329 Major depressive disorder, single episode, unspecified: Secondary | ICD-10-CM | POA: Insufficient documentation

## 2014-12-01 DIAGNOSIS — R112 Nausea with vomiting, unspecified: Secondary | ICD-10-CM | POA: Diagnosis present

## 2014-12-01 DIAGNOSIS — I10 Essential (primary) hypertension: Secondary | ICD-10-CM | POA: Diagnosis not present

## 2014-12-01 DIAGNOSIS — R197 Diarrhea, unspecified: Secondary | ICD-10-CM | POA: Diagnosis not present

## 2014-12-01 DIAGNOSIS — Z87891 Personal history of nicotine dependence: Secondary | ICD-10-CM | POA: Diagnosis not present

## 2014-12-01 DIAGNOSIS — N289 Disorder of kidney and ureter, unspecified: Secondary | ICD-10-CM | POA: Diagnosis not present

## 2014-12-01 DIAGNOSIS — M069 Rheumatoid arthritis, unspecified: Secondary | ICD-10-CM | POA: Diagnosis not present

## 2014-12-01 DIAGNOSIS — Z8669 Personal history of other diseases of the nervous system and sense organs: Secondary | ICD-10-CM | POA: Diagnosis not present

## 2014-12-01 DIAGNOSIS — R1012 Left upper quadrant pain: Secondary | ICD-10-CM | POA: Diagnosis not present

## 2014-12-01 DIAGNOSIS — E039 Hypothyroidism, unspecified: Secondary | ICD-10-CM | POA: Insufficient documentation

## 2014-12-01 DIAGNOSIS — R109 Unspecified abdominal pain: Secondary | ICD-10-CM

## 2014-12-01 LAB — COMPREHENSIVE METABOLIC PANEL
ALT: 78 U/L — AB (ref 17–63)
AST: 72 U/L — AB (ref 15–41)
Albumin: 4.3 g/dL (ref 3.5–5.0)
Alkaline Phosphatase: 84 U/L (ref 38–126)
Anion gap: 13 (ref 5–15)
BUN: 19 mg/dL (ref 6–20)
CHLORIDE: 93 mmol/L — AB (ref 101–111)
CO2: 29 mmol/L (ref 22–32)
Calcium: 10.2 mg/dL (ref 8.9–10.3)
Creatinine, Ser: 1.8 mg/dL — ABNORMAL HIGH (ref 0.61–1.24)
GFR calc Af Amer: 45 mL/min — ABNORMAL LOW (ref >60)
GFR calc non Af Amer: 39 mL/min — ABNORMAL LOW (ref >60)
GLUCOSE: 127 mg/dL — AB (ref 65–99)
Potassium: 3.5 mmol/L (ref 3.5–5.1)
SODIUM: 135 mmol/L (ref 135–145)
Total Bilirubin: 0.9 mg/dL (ref 0.3–1.2)
Total Protein: 8.1 g/dL (ref 6.5–8.1)

## 2014-12-01 LAB — CBC WITH DIFFERENTIAL/PLATELET
BASOS ABS: 0.1 10*3/uL (ref 0.0–0.1)
Basophils Relative: 0 %
EOS ABS: 0.1 10*3/uL (ref 0.0–0.7)
EOS PCT: 1 %
HCT: 39.3 % (ref 39.0–52.0)
Hemoglobin: 13.8 g/dL (ref 13.0–17.0)
Lymphocytes Relative: 10 %
Lymphs Abs: 1.4 10*3/uL (ref 0.7–4.0)
MCH: 36.2 pg — ABNORMAL HIGH (ref 26.0–34.0)
MCHC: 35.1 g/dL (ref 30.0–36.0)
MCV: 103.1 fL — ABNORMAL HIGH (ref 78.0–100.0)
Monocytes Absolute: 1.3 10*3/uL — ABNORMAL HIGH (ref 0.1–1.0)
Monocytes Relative: 10 %
Neutro Abs: 10.7 10*3/uL — ABNORMAL HIGH (ref 1.7–7.7)
Neutrophils Relative %: 79 %
PLATELETS: 302 10*3/uL (ref 150–400)
RBC: 3.81 MIL/uL — AB (ref 4.22–5.81)
RDW: 13.1 % (ref 11.5–15.5)
WBC: 13.5 10*3/uL — AB (ref 4.0–10.5)

## 2014-12-01 LAB — POC OCCULT BLOOD, ED: Fecal Occult Bld: NEGATIVE

## 2014-12-01 LAB — LIPASE, BLOOD: Lipase: 22 U/L (ref 22–51)

## 2014-12-01 MED ORDER — SUCRALFATE 1 G PO TABS: 1.0000 g | ORAL_TABLET | Freq: Three times a day (TID) | ORAL | Status: DC

## 2014-12-01 MED ORDER — SODIUM CHLORIDE 0.9 % IV BOLUS (SEPSIS)
1000.0000 mL | Freq: Once | INTRAVENOUS | Status: AC
  Administered 2014-12-01: 1000 mL via INTRAVENOUS

## 2014-12-01 MED ORDER — SUCRALFATE 1 G PO TABS
1.0000 g | ORAL_TABLET | Freq: Once | ORAL | Status: AC
  Administered 2014-12-01: 1 g via ORAL
  Filled 2014-12-01: qty 1

## 2014-12-01 MED ORDER — MORPHINE SULFATE (PF) 4 MG/ML IV SOLN
4.0000 mg | Freq: Once | INTRAVENOUS | Status: AC
  Administered 2014-12-01: 4 mg via INTRAVENOUS
  Filled 2014-12-01: qty 1

## 2014-12-01 MED ORDER — IOHEXOL 300 MG/ML  SOLN
100.0000 mL | Freq: Once | INTRAMUSCULAR | Status: AC | PRN
  Administered 2014-12-01: 80 mL via INTRAVENOUS

## 2014-12-01 MED ORDER — ONDANSETRON HCL 4 MG/2ML IJ SOLN
4.0000 mg | Freq: Once | INTRAMUSCULAR | Status: AC
  Administered 2014-12-01: 4 mg via INTRAVENOUS
  Filled 2014-12-01: qty 2

## 2014-12-01 MED ORDER — GI COCKTAIL ~~LOC~~
30.0000 mL | Freq: Once | ORAL | Status: AC
  Administered 2014-12-01: 30 mL via ORAL
  Filled 2014-12-01: qty 30

## 2014-12-01 NOTE — ED Notes (Signed)
Patient is aware of need for urine specimen - pt unable to provide at this time.

## 2014-12-01 NOTE — ED Notes (Addendum)
Patient returned from CT scan.

## 2014-12-01 NOTE — ED Notes (Signed)
He states his sx began three days ago with a couple of diarrhea stools.  He states he vomited at about 9am today and noticed "some blood in it".  He further tells me his "belly has been bloated for 2 months".  He is in no distress.

## 2014-12-01 NOTE — ED Notes (Addendum)
Pt c/o abdominal distention and discomfort x 2 months.  Pt states he had onset of vomiting and diarrhea 2days ago.  Pt states he had episode of scant bright red blood in stool this morning.  Pt denies other complaints.

## 2014-12-01 NOTE — ED Notes (Signed)
Pt aware of needed urine specimen.

## 2014-12-01 NOTE — Discharge Instructions (Signed)
You have been evaluated for your abdominal pain along with nausea, vomiting and diarrhea.  Please stay hydrated.  Avoid alcohol use.  Follow up closely with your primary care provider and your gastroenterologist for further management.  Return promptly if your condition worsen or if you have other concerns.   Abdominal Pain, Adult Many things can cause abdominal pain. Usually, abdominal pain is not caused by a disease and will improve without treatment. It can often be observed and treated at home. Your health care provider will do a physical exam and possibly order blood tests and X-rays to help determine the seriousness of your pain. However, in many cases, more time must pass before a clear cause of the pain can be found. Before that point, your health care provider may not know if you need more testing or further treatment. HOME CARE INSTRUCTIONS Monitor your abdominal pain for any changes. The following actions may help to alleviate any discomfort you are experiencing:  Only take over-the-counter or prescription medicines as directed by your health care provider.  Do not take laxatives unless directed to do so by your health care provider.  Try a clear liquid diet (broth, tea, or water) as directed by your health care provider. Slowly move to a bland diet as tolerated. SEEK MEDICAL CARE IF:  You have unexplained abdominal pain.  You have abdominal pain associated with nausea or diarrhea.  You have pain when you urinate or have a bowel movement.  You experience abdominal pain that wakes you in the night.  You have abdominal pain that is worsened or improved by eating food.  You have abdominal pain that is worsened with eating fatty foods.  You have a fever. SEEK IMMEDIATE MEDICAL CARE IF:  Your pain does not go away within 2 hours.  You keep throwing up (vomiting).  Your pain is felt only in portions of the abdomen, such as the right side or the left lower portion of the  abdomen.  You pass bloody or black tarry stools. MAKE SURE YOU:  Understand these instructions.  Will watch your condition.  Will get help right away if you are not doing well or get worse.   This information is not intended to replace advice given to you by your health care provider. Make sure you discuss any questions you have with your health care provider.   Document Released: 11/19/2004 Document Revised: 10/31/2014 Document Reviewed: 10/19/2012 Elsevier Interactive Patient Education [employer].

## 2014-12-01 NOTE — ED Notes (Signed)
Patient is aware of need for urine specimen.  Urinal at bedside.

## 2014-12-01 NOTE — ED Provider Notes (Signed)
CSN: [id number]     Arrival date & time 12/01/14  1047 History   First MD Initiated Contact with Patient 12/01/14 1100     Chief Complaint  Patient presents with  . 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 Patient report for the past 2 months he has noticed increasing abdominal swelling. For the past 2-3 days he has had left upper quadrant abdominal pain which he described as a burning and stabbing sensation. He also endorsed very nauseous and has had recurrent vomiting at least once every hour for the past day and twice today. Vomitus of yellow emesis along with trace of blood. He also endorsed having persistent diarrhea every hour. Diarrhea with trace of red blood likely from his hemorrhoid for constipation. Patient mentioned that his bloody vomitus is concerning to him. Complaining of burning sensation in his throat. Quantify his hematemesis as a handful. Patient admits to drinking at least 2 alcoholic beverage daily either one scotch it has not drank any alcohol for the past 2 days due to his symptoms. He also reported that his sister who was recently sick with an infection. He has had a colonoscopy but on able to recall the results. States it has been done within the past 5 years. At this time is abdominal pain is mild. He denies having fever, chills, chest pain, shortness of breath, productive cough, hemoptysis, or rash.    Past Medical History  Diagnosis Date  . Hypertension   . Thyroid disease   . Hemorrhoids   . Hypothyroidism   . Tachycardia     PT STATES HIS HEART RATE USUALLY 100 OR MORE  . Depression   . Anxiety   . GERD (gastroesophageal reflux disease)     PREVACID IF NEEDED  . Arthritis     RHEUMATOID ARTHRITIS; OA LEFT KNEE  . Hepatitis A     PT  STATES TYPE OF HEPATITIS YOU GET FROM [facility]LL[facility]ISH  . 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 Sleep apnea     CLAUSTROPHOBIC - COULD NOT TOLERATE CPAP MASK  . Heart murmur   . Pancreatitis, alcoholic, acute   . Cancer ([place])     MELANOMA REMOVED RT SHOULDER   Past Surgical History  Procedure Laterality Date  . Knee surgery      BILATERAL KNEE ARTHROSCOPY  . Shoulder surgery      RIGHT ROTATOR CUFF REPAIR AND LEFT ARTHROSCOPY  . Elbow surgery      BILATERAL ELBOW SURGERY  . Refractive surgery    . Total knee arthroplasty Left 08/23/2013    Procedure: LEFT TOTAL KNEE ARTHROPLASTY;  Surgeon: Tobi Bastos, MD;  Location: [facility];  Service: Orthopedics;  Laterality: Left;  . Colonscopy     . Left tkr      July 2015  . Total knee arthroplasty Right 02/07/2014    Procedure: RIGHT TOTAL KNEE ARTHROPLASTY;  Surgeon: Tobi Bastos, MD;  Location: [facility];  Service: Orthopedics;  Laterality: Right;  . Lumbar laminectomy/decompression microdiscectomy Left 06/06/2014    Procedure: LEFT L4-5 DECOMPRESSION ;  Surgeon: Melina Schools, MD;  Location: [facility];  Service: Orthopedics;  Laterality: Left;   Family History  Problem Relation Age of Onset  . Hypertension Mother   . Other Mother  varicose veins  . Hypertension Sister    Social History  Substance Use Topics  . Smoking status: Former Smoker -- 1.50 packs/day for 35 years    Types: Cigars    Quit date: 02/22/1998  . Smokeless tobacco: Never Used     Comment: used cigarettes 20 years ago  . Alcohol Use: No     Comment: " last alcohol use was in December 2015."    Review of Systems  All other systems reviewed and are negative.     Allergies  Review of patient's allergies indicates no known allergies.  Home Medications   Prior to Admission medications   Medication Sig Start Date End Date Taking? Authorizing Provider  ALPRAZolam (XANAX) 0.25 MG tablet Take 0.25 mg by mouth at bedtime as  needed. anxiety 04/23/14   Historical Provider, MD  cholecalciferol (VITAMIN D) 1000 UNITS tablet Take 3,000 Units by mouth daily.    Historical Provider, MD  dicyclomine (BENTYL) 20 MG tablet Take 1 tablet (20 mg total) by mouth 2 (two) times daily. 10/22/14   Tatyana Kirichenko, PA-C  docusate sodium (COLACE) 100 MG capsule Take 1 capsule (100 mg total) by mouth 2 (two) times daily. Patient not taking: Reported on 10/22/2014 02/09/14   Ardeen Jourdain, PA-C  escitalopram (LEXAPRO) 20 MG tablet Take 20 mg by mouth every morning.    Historical Provider, MD  folic acid (FOLVITE) 1 MG tablet Take 1 tablet (1 mg total) by mouth daily. Patient not taking: Reported on 06/04/2014 05/23/14   Robbie Lis, MD  HYDROcodone-acetaminophen (NORCO/VICODIN) 5-325 MG per tablet Take 1 tablet by mouth every 8 (eight) hours as needed for moderate pain or severe pain.    Historical Provider, MD  lansoprazole (PREVACID) 15 MG capsule Take 15 mg by mouth daily as needed (heartburn).     Historical Provider, MD  levothyroxine (SYNTHROID, LEVOTHROID) 175 MCG tablet Take 175 mcg by mouth daily before breakfast.    Historical Provider, MD  lisinopril (PRINIVIL,ZESTRIL) 10 MG tablet Take 20 mg by mouth every morning.     Historical Provider, MD  LORazepam (ATIVAN) 1 MG tablet Take 1 tablet (1 mg total) by mouth every 6 (six) hours as needed (CIWA-AR > 8  -OR-  withdrawal symptoms:  anxiety, agitation, insomnia, diaphoresis, nausea, vomiting, tremors, tachycardia, or hypertension.). Patient not taking: Reported on 10/22/2014 05/23/14   Robbie Lis, MD  methocarbamol (ROBAXIN) 500 MG tablet Take 1 tablet (500 mg total) by mouth 3 (three) times daily as needed for muscle spasms. Patient not taking: Reported on 10/22/2014 06/07/14   Melina Schools, MD  OLANZapine-FLUoxetine (SYMBYAX) 12-50 MG per capsule Take 1 capsule by mouth at bedtime.    Historical Provider, MD  ondansetron (ZOFRAN) 4 MG tablet Take 1 tablet (4 mg total) by mouth  every 8 (eight) hours as needed for nausea or vomiting. Patient not taking: Reported on 10/22/2014 06/07/14   Melina Schools, MD  OVER THE COUNTER MEDICATION Place into both eyes daily as needed (contacts.). Flushes eyes    Historical Provider, MD  oxyCODONE (ROXICODONE) 30 MG immediate release tablet Take 1 tablet (30 mg total) by mouth every 6 (six) hours as needed for severe pain. Patient not taking: Reported on 10/22/2014 06/07/14   Melina Schools, MD  OxyCODONE ER [facility] ER) 27 MG C12A Take 27 mg by mouth every 12 (twelve) hours.    Historical Provider, MD  zolpidem (AMBIEN) 10 MG tablet Take 10 mg by mouth at bedtime as needed for sleep.  Historical Provider, MD   BP 126/78 mmHg  Pulse 102  Temp(Src) 98.3 F (36.8 C) (Oral)  Resp 18  SpO2 98% Physical Exam  Constitutional: He appears well-developed and well-nourished. No distress.  Caucasian male appears to be in no acute discomfort.  HENT:  Head: Atraumatic.  Mouth/Throat: Oropharynx is clear and moist.  Eyes: Conjunctivae are normal.  Neck: Neck supple. No JVD present.  Cardiovascular: Normal rate and regular rhythm.   Pulmonary/Chest: Effort normal and breath sounds normal. No respiratory distress. He has no wheezes. He exhibits no tenderness.  Abdominal:  Protuberance abdomen that is soft with tenderness to left upper quadrant and left abdomen no guarding or rebound tenderness. No evidence of caput medusa.    Musculoskeletal: He exhibits no edema.  Neurological: He is alert.  Skin: No rash noted.  Psychiatric: He has a normal mood and affect.  Nursing note and vitals reviewed.   ED Course  Procedures (including critical care time)  Patient presents with abdominal swelling along with nausea vomiting diarrhea. He appears mildly dehydrated. He does have a protuberant abdomen but no fluid wave to suggest significant ascites. He does have history of alcohol abuse and is here with left upper quadrant abdominal pain likely  suggestive of pancreatitis versus viral GI versus diverticular disease. Workup initiated.  1:19 PM Patient with leukocytosis, WBC is 13.5. He also has evidence of renal insufficiency with creatinine of 1.8, and GFR 45. Mild transaminitis with a ST 72, AST 78 and normal alkaline phosphatase. Lipase is normal. Given his abdominal discomfort and leukocytosis, will obtain abdominal pelvic CT for further care.  Care discussed with DR. Floyd.  3:40 PM Abdominal and pelvis CT scan demonstrated evidence of advanced fatty changes to the liver diffusely but no acute finding were noted. Patient admits that he has liver disease. Patient felt much better after receiving treatment including GI cocktail. He has not vomited or having diarrhea while in the ED. He received IV fluid. His cold is negative. At this time patient felt comfortable to be discharge and follow-up closely with his GI specialist and his primary care provider for further evaluation and management. He is made aware of his renal insufficiency in which I encourage increased fluid intake and avoid alcohol use. Patient sent to return promptly if his condition worsened. Patient was made aware of potential development of esophageal varices due to his history of liver disease and alcohol use.  Labs Review Labs Reviewed  CBC WITH DIFFERENTIAL/PLATELET - Abnormal; Notable for the following:    WBC 13.5 (*)    RBC 3.81 (*)    MCV 103.1 (*)    MCH 36.2 (*)    Neutro Abs 10.7 (*)    Monocytes Absolute 1.3 (*)    All other components within normal limits  COMPREHENSIVE METABOLIC PANEL - Abnormal; Notable for the following:    Chloride 93 (*)    Glucose, Bld 127 (*)    Creatinine, Ser 1.80 (*)    AST 72 (*)    ALT 78 (*)    GFR calc non Af Amer 39 (*)    GFR calc Af Amer 45 (*)    All other components within normal limits  LIPASE, BLOOD  URINALYSIS, ROUTINE W REFLEX MICROSCOPIC (NOT AT [facility])  POC OCCULT BLOOD, ED    Imaging Review Ct Abdomen  Pelvis W Contrast  12/01/2014   CLINICAL DATA:  Diarrhea beginning 3 days ago. Vomiting subsequently with some hematemesis. Bloating.  EXAM: CT ABDOMEN AND PELVIS  WITH CONTRAST  TECHNIQUE: Multidetector CT imaging of the abdomen and pelvis was performed using the standard protocol following bolus administration of intravenous contrast.  CONTRAST:  66mL OMNIPAQUE IOHEXOL 300 MG/ML  SOLN  COMPARISON:  10/22/2014 an multiple previous  FINDINGS: Lung bases are clear.  No pleural or pericardial fluid.  There is diffuse fatty change of the liver. No focal lesion. No calcified gallstones. The spleen is normal. The pancreas is normal. The adrenal glands are normal. The right kidney is normal. The left kidney is normal except for a few small cysts. Largest cyst the lower pole measures 4 cm. There is atherosclerosis of the aorta without aneurysm. The IVC is normal. No retroperitoneal mass or adenopathy. No visible bowel pathology. Fluid density stool is noted in the right colon. Bladder, prostate gland and seminal vesicles are normal. There is advanced chronic degenerative disease of the lumbar spine but no acute bone finding.  IMPRESSION: Advanced fatty change of the liver diffusely.  Benign appearing renal cysts.  Fluid density stool in the right colon. No specific pathologic bowel finding.  Atherosclerosis of the aorta.  Degenerative disease of the lumbar spine.   Electronically Signed   By: Nelson Chimes M.D.   On: 12/01/2014 15:01   I have personally reviewed and evaluated these images and lab results as part of my medical decision-making.   EKG Interpretation None      MDM   Final diagnoses:  Nausea vomiting and diarrhea  Left sided abdominal pain  Renal insufficiency    BP 126/78 mmHg  Pulse 102  Temp(Src) 98.3 F (36.8 C) (Oral)  Resp 18  SpO2 98%     Domenic Moras, PA-C 12/01/14 [place], DO 12/01/14 1606

## 2014-12-01 NOTE — ED Notes (Addendum)
Patient in CT scan.  Family at bedside updated on patient location.

## 2014-12-03 ENCOUNTER — Emergency Department (HOSPITAL_COMMUNITY)
Admission: EM | Admit: 2014-12-03 | Discharge: 2014-12-05 | Disposition: A | Discharge status: Home / Self Care | Payer: 59 | Attending: Emergency Medicine | Admitting: Emergency Medicine

## 2014-12-03 ENCOUNTER — Encounter (HOSPITAL_COMMUNITY): Payer: Self-pay | Admitting: Emergency Medicine

## 2014-12-03 DIAGNOSIS — Z8669 Personal history of other diseases of the nervous system and sense organs: Secondary | ICD-10-CM | POA: Diagnosis not present

## 2014-12-03 DIAGNOSIS — R011 Cardiac murmur, unspecified: Secondary | ICD-10-CM | POA: Insufficient documentation

## 2014-12-03 DIAGNOSIS — F1020 Alcohol dependence, uncomplicated: Secondary | ICD-10-CM | POA: Diagnosis present

## 2014-12-03 DIAGNOSIS — R197 Diarrhea, unspecified: Secondary | ICD-10-CM | POA: Insufficient documentation

## 2014-12-03 DIAGNOSIS — F1310 Sedative, hypnotic or anxiolytic abuse, uncomplicated: Secondary | ICD-10-CM | POA: Diagnosis not present

## 2014-12-03 DIAGNOSIS — Z79899 Other long term (current) drug therapy: Secondary | ICD-10-CM | POA: Diagnosis not present

## 2014-12-03 DIAGNOSIS — F329 Major depressive disorder, single episode, unspecified: Secondary | ICD-10-CM | POA: Diagnosis not present

## 2014-12-03 DIAGNOSIS — F1410 Cocaine abuse, uncomplicated: Secondary | ICD-10-CM | POA: Insufficient documentation

## 2014-12-03 DIAGNOSIS — Z8619 Personal history of other infectious and parasitic diseases: Secondary | ICD-10-CM | POA: Diagnosis not present

## 2014-12-03 DIAGNOSIS — Z8719 Personal history of other diseases of the digestive system: Secondary | ICD-10-CM | POA: Insufficient documentation

## 2014-12-03 DIAGNOSIS — F1210 Cannabis abuse, uncomplicated: Secondary | ICD-10-CM | POA: Insufficient documentation

## 2014-12-03 DIAGNOSIS — K219 Gastro-esophageal reflux disease without esophagitis: Secondary | ICD-10-CM | POA: Insufficient documentation

## 2014-12-03 DIAGNOSIS — I10 Essential (primary) hypertension: Secondary | ICD-10-CM | POA: Insufficient documentation

## 2014-12-03 DIAGNOSIS — F1010 Alcohol abuse, uncomplicated: Secondary | ICD-10-CM

## 2014-12-03 DIAGNOSIS — F419 Anxiety disorder, unspecified: Secondary | ICD-10-CM | POA: Insufficient documentation

## 2014-12-03 DIAGNOSIS — Z87891 Personal history of nicotine dependence: Secondary | ICD-10-CM | POA: Diagnosis not present

## 2014-12-03 DIAGNOSIS — F10129 Alcohol abuse with intoxication, unspecified: Secondary | ICD-10-CM | POA: Diagnosis present

## 2014-12-03 DIAGNOSIS — Z85820 Personal history of malignant melanoma of skin: Secondary | ICD-10-CM | POA: Insufficient documentation

## 2014-12-03 DIAGNOSIS — D849 Immunodeficiency, unspecified: Secondary | ICD-10-CM | POA: Insufficient documentation

## 2014-12-03 DIAGNOSIS — E039 Hypothyroidism, unspecified: Secondary | ICD-10-CM | POA: Diagnosis not present

## 2014-12-03 LAB — CBC WITH DIFFERENTIAL/PLATELET
Basophils Absolute: 0.1 10*3/uL (ref 0.0–0.1)
Basophils Relative: 1 %
EOS ABS: 0.8 10*3/uL — AB (ref 0.0–0.7)
EOS PCT: 6 %
HCT: 40.2 % (ref 39.0–52.0)
HEMOGLOBIN: 13.9 g/dL (ref 13.0–17.0)
LYMPHS ABS: 4.8 10*3/uL — AB (ref 0.7–4.0)
Lymphocytes Relative: 38 %
MCH: 35.6 pg — AB (ref 26.0–34.0)
MCHC: 34.6 g/dL (ref 30.0–36.0)
MCV: 103.1 fL — AB (ref 78.0–100.0)
MONOS PCT: 7 %
Monocytes Absolute: 0.8 10*3/uL (ref 0.1–1.0)
Neutro Abs: 6.2 10*3/uL (ref 1.7–7.7)
Neutrophils Relative %: 48 %
PLATELETS: 295 10*3/uL (ref 150–400)
RBC: 3.9 MIL/uL — ABNORMAL LOW (ref 4.22–5.81)
RDW: 12.9 % (ref 11.5–15.5)
WBC: 12.7 10*3/uL — ABNORMAL HIGH (ref 4.0–10.5)

## 2014-12-03 LAB — COMPREHENSIVE METABOLIC PANEL
ALBUMIN: 4.2 g/dL (ref 3.5–5.0)
ALK PHOS: 80 U/L (ref 38–126)
ALT: 70 U/L — AB (ref 17–63)
AST: 79 U/L — AB (ref 15–41)
Anion gap: 9 (ref 5–15)
BUN: 12 mg/dL (ref 6–20)
CALCIUM: 8.9 mg/dL (ref 8.9–10.3)
CO2: 28 mmol/L (ref 22–32)
CREATININE: 1.41 mg/dL — AB (ref 0.61–1.24)
Chloride: 98 mmol/L — ABNORMAL LOW (ref 101–111)
GFR calc Af Amer: >60 mL/min (ref >60)
GFR calc non Af Amer: 52 mL/min — ABNORMAL LOW (ref >60)
GLUCOSE: 116 mg/dL — AB (ref 65–99)
Potassium: 3.6 mmol/L (ref 3.5–5.1)
SODIUM: 135 mmol/L (ref 135–145)
Total Bilirubin: 0.6 mg/dL (ref 0.3–1.2)
Total Protein: 8.1 g/dL (ref 6.5–8.1)

## 2014-12-03 LAB — LIPASE, BLOOD: LIPASE: 25 U/L (ref 22–51)

## 2014-12-03 LAB — ETHANOL: Alcohol, Ethyl (B): 206 mg/dL — ABNORMAL HIGH (ref <5)

## 2014-12-03 MED ORDER — VITAMIN B-1 100 MG PO TABS
100.0000 mg | ORAL_TABLET | Freq: Every day | ORAL | Status: DC
  Administered 2014-12-04 – 2014-12-05 (×2): 100 mg via ORAL
  Filled 2014-12-03 (×3): qty 1

## 2014-12-03 MED ORDER — LISINOPRIL 20 MG PO TABS
20.0000 mg | ORAL_TABLET | Freq: Every day | ORAL | Status: DC
  Administered 2014-12-04 – 2014-12-05 (×2): 20 mg via ORAL
  Filled 2014-12-03 (×3): qty 1

## 2014-12-03 MED ORDER — SUCRALFATE 1 G PO TABS
1.0000 g | ORAL_TABLET | Freq: Three times a day (TID) | ORAL | Status: DC
  Administered 2014-12-04 (×4): 1 g via ORAL
  Filled 2014-12-03 (×5): qty 1

## 2014-12-03 MED ORDER — LORAZEPAM 2 MG/ML IJ SOLN
1.0000 mg | Freq: Four times a day (QID) | INTRAMUSCULAR | Status: DC | PRN
  Administered 2014-12-03: 1 mg via INTRAVENOUS
  Filled 2014-12-03: qty 1

## 2014-12-03 MED ORDER — LEVOTHYROXINE SODIUM 175 MCG PO TABS
175.0000 ug | ORAL_TABLET | Freq: Every day | ORAL | Status: DC
  Administered 2014-12-04 – 2014-12-05 (×2): 175 ug via ORAL
  Filled 2014-12-03 (×3): qty 1

## 2014-12-03 MED ORDER — SODIUM CHLORIDE 0.9 % IV BOLUS (SEPSIS)
2000.0000 mL | Freq: Once | INTRAVENOUS | Status: AC
  Administered 2014-12-03: 2000 mL via INTRAVENOUS

## 2014-12-03 MED ORDER — THIAMINE HCL 100 MG/ML IJ SOLN
100.0000 mg | Freq: Every day | INTRAMUSCULAR | Status: DC
  Administered 2014-12-03: 100 mg via INTRAVENOUS
  Filled 2014-12-03 (×2): qty 2

## 2014-12-03 MED ORDER — FOLIC ACID 1 MG PO TABS
1.0000 mg | ORAL_TABLET | Freq: Every day | ORAL | Status: DC
  Administered 2014-12-04 – 2014-12-05 (×2): 1 mg via ORAL
  Filled 2014-12-03 (×3): qty 1

## 2014-12-03 MED ORDER — ADULT MULTIVITAMIN W/MINERALS CH
1.0000 | ORAL_TABLET | Freq: Every day | ORAL | Status: DC
  Administered 2014-12-04 – 2014-12-05 (×2): 1 via ORAL
  Filled 2014-12-03 (×2): qty 1

## 2014-12-03 MED ORDER — ZOLPIDEM TARTRATE 10 MG PO TABS
10.0000 mg | ORAL_TABLET | Freq: Every evening | ORAL | Status: DC | PRN
  Administered 2014-12-03 – 2014-12-04 (×2): 10 mg via ORAL
  Filled 2014-12-03 (×2): qty 1

## 2014-12-03 MED ORDER — LORAZEPAM 1 MG PO TABS
1.0000 mg | ORAL_TABLET | Freq: Four times a day (QID) | ORAL | Status: DC | PRN
  Administered 2014-12-03 – 2014-12-05 (×5): 1 mg via ORAL
  Filled 2014-12-03 (×5): qty 1

## 2014-12-03 MED ORDER — VITAMIN D3 25 MCG (1000 UNIT) PO TABS
3000.0000 [IU] | ORAL_TABLET | Freq: Every day | ORAL | Status: DC
  Administered 2014-12-04 – 2014-12-05 (×2): 3000 [IU] via ORAL
  Filled 2014-12-03 (×2): qty 3

## 2014-12-03 MED ORDER — ESCITALOPRAM OXALATE 10 MG PO TABS
20.0000 mg | ORAL_TABLET | Freq: Every morning | ORAL | Status: DC
  Administered 2014-12-04 – 2014-12-05 (×2): 20 mg via ORAL
  Filled 2014-12-03 (×2): qty 2

## 2014-12-03 MED ORDER — ONDANSETRON HCL 4 MG/2ML IJ SOLN
4.0000 mg | Freq: Once | INTRAMUSCULAR | Status: AC
  Administered 2014-12-03: 4 mg via INTRAVENOUS
  Filled 2014-12-03: qty 2

## 2014-12-03 MED ORDER — OLANZAPINE-FLUOXETINE HCL 12-50 MG PO CAPS
1.0000 | ORAL_CAPSULE | Freq: Every day | ORAL | Status: DC
  Administered 2014-12-03 – 2014-12-04 (×2): 1 via ORAL
  Filled 2014-12-03 (×3): qty 1

## 2014-12-03 NOTE — [facility] Counselor (Signed)
Spoke with patients nurse to request UDS be collected.   Rosalin Hawking, LCSW Therapeutic Triage Specialist [place] 12/03/2014 8:18 PM

## 2014-12-03 NOTE — ED Notes (Signed)
Patient vomiting after being given zofran.  PO meds will be held.

## 2014-12-03 NOTE — ED Notes (Signed)
Dr. Rex Kras is currently going through patient's home medications and will be ordering appropriately.

## 2014-12-03 NOTE — ED Notes (Signed)
Per pt, states he has been cutting back from ETOH-wants detox-last drink this am

## 2014-12-03 NOTE — ED Notes (Signed)
Pt states he was told by his PMD to come to ED for treatment of ETOH withdrawal.  Pt states he has been decreasing ETOH intake recently.  Pt states he feels cold, shaky, anxious, and nauseated.  Pt also c/o diarrhea x several days.  No other complaints at this time.

## 2014-12-03 NOTE — ED Notes (Signed)
Bed: WA28 Expected date:  Expected time:  Means of arrival:  Comments: Hold for triage 4 

## 2014-12-03 NOTE — ED Provider Notes (Signed)
CSN: [id number]     Arrival date & time 12/03/14  1129 History   First MD Initiated Contact with Patient 12/03/14 1217     Chief Complaint  Patient presents with  . ETOH detox      (Consider location/radiation/quality/duration/timing/severity/associated sxs/prior Treatment) HPI Patient requesting detox from alcohol. States he last drank alcohol this morning in an attempt to control his shakes. Presently he feels as if his "skin is crawling" he denies hallucinations. No treatment prior to coming here. Other associated symptoms include vomiting and diarrhea and epigastric pain. Past Medical History  Diagnosis Date  . Hypertension   . Thyroid disease   . Hemorrhoids   . Hypothyroidism   . Tachycardia     PT STATES HIS HEART RATE USUALLY 100 OR MORE  . Depression   . Anxiety   . GERD (gastroesophageal reflux disease)     PREVACID IF NEEDED  . Arthritis     RHEUMATOID ARTHRITIS; OA LEFT KNEE  . 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 Sleep apnea     CLAUSTROPHOBIC - COULD NOT TOLERATE CPAP MASK  . Heart murmur   . Pancreatitis, alcoholic, acute   . Cancer ([place])     MELANOMA REMOVED RT SHOULDER   Past Surgical History  Procedure Laterality Date  . Knee surgery      BILATERAL KNEE ARTHROSCOPY  . Shoulder surgery      RIGHT ROTATOR CUFF REPAIR AND LEFT ARTHROSCOPY  . Elbow surgery      BILATERAL ELBOW SURGERY  . Refractive surgery    . Total knee arthroplasty Left 08/23/2013    Procedure: LEFT TOTAL KNEE ARTHROPLASTY;  Surgeon: Tobi Bastos, MD;  Location: [facility];  Service: Orthopedics;  Laterality: Left;  . Colonscopy     . Left tkr      July 2015  . Total knee arthroplasty Right 02/07/2014    Procedure: RIGHT TOTAL KNEE ARTHROPLASTY;  Surgeon: Tobi Bastos, MD;  [facility]: [facility];  Service: Orthopedics;  Laterality: Right;  . Lumbar  laminectomy/decompression microdiscectomy Left 06/06/2014    Procedure: LEFT L4-5 DECOMPRESSION ;  Surgeon: Melina Schools, MD;  Location: [facility];  Service: Orthopedics;  Laterality: Left;   Family History  Problem Relation Age of Onset  . Hypertension Mother   . Other Mother     varicose veins  . Hypertension Sister    Social History  Substance Use Topics  . Smoking status: Former Smoker -- 1.50 packs/day for 35 years    Types: Cigars    Quit date: 02/22/1998  . Smokeless tobacco: Never Used     Comment: used cigarettes 20 years ago  . Alcohol Use: No     Comment: " last alcohol use was in December 2015."    Review of Systems  Gastrointestinal: Positive for nausea, vomiting and diarrhea.  Allergic/Immunologic: Positive for immunocompromised state.       Alcoholic  All other systems reviewed and are negative.     Allergies  Review of patient's allergies indicates no known allergies.  Home Medications   Prior to Admission medications   Medication Sig Start Date End Date Taking? Authorizing Provider  ALPRAZolam (XANAX) 0.25 MG tablet Take 0.25 mg by mouth at bedtime as needed. anxiety 04/23/14   Historical Provider, MD  cholecalciferol (VITAMIN D) 1000 UNITS tablet Take  3,000 Units by mouth daily.    Historical Provider, MD  dicyclomine (BENTYL) 20 MG tablet Take 1 tablet (20 mg total) by mouth 2 (two) times daily. Patient not taking: Reported on 12/01/2014 10/22/14   Tatyana Kirichenko, PA-C  escitalopram (LEXAPRO) 20 MG tablet Take 20 mg by mouth every morning.    Historical Provider, MD  folic acid (FOLVITE) 1 MG tablet Take 1 tablet (1 mg total) by mouth daily. Patient not taking: Reported on 06/04/2014 05/23/14   Robbie Lis, MD  levothyroxine (SYNTHROID, LEVOTHROID) 175 MCG tablet Take 175 mcg by mouth daily before breakfast.    Historical Provider, MD  lisinopril (PRINIVIL,ZESTRIL) 10 MG tablet Take 20 mg by mouth every morning.     Historical Provider, MD  lisinopril  (PRINIVIL,ZESTRIL) 20 MG tablet Take 20 mg by mouth daily with breakfast.    Historical Provider, MD  LORazepam (ATIVAN) 1 MG tablet Take 1 tablet (1 mg total) by mouth every 6 (six) hours as needed (CIWA-AR > 8  -OR-  withdrawal symptoms:  anxiety, agitation, insomnia, diaphoresis, nausea, vomiting, tremors, tachycardia, or hypertension.). Patient not taking: Reported on 10/22/2014 05/23/14   Robbie Lis, MD  methocarbamol (ROBAXIN) 500 MG tablet Take 1 tablet (500 mg total) by mouth 3 (three) times daily as needed for muscle spasms. Patient not taking: Reported on 10/22/2014 06/07/14   Melina Schools, MD  OLANZapine-FLUoxetine (SYMBYAX) 12-50 MG per capsule Take 1 capsule by mouth at bedtime.    Historical Provider, MD  ondansetron (ZOFRAN) 4 MG tablet Take 1 tablet (4 mg total) by mouth every 8 (eight) hours as needed for nausea or vomiting. Patient not taking: Reported on 10/22/2014 06/07/14   Melina Schools, MD  OVER THE COUNTER MEDICATION Place into both eyes daily as needed (contacts.). Flushes eyes    Historical Provider, MD  oxyCODONE (ROXICODONE) 30 MG immediate release tablet Take 1 tablet (30 mg total) by mouth every 6 (six) hours as needed for severe pain. Patient taking differently: Take 30 mg by mouth 4 (four) times daily.  06/07/14   Melina Schools, MD  promethazine (PHENERGAN) 12.5 MG tablet Take 12.5 mg by mouth every 6 (six) hours as needed for nausea or vomiting.    Historical Provider, MD  sucralfate (CARAFATE) 1 G tablet Take 1 tablet (1 g total) by mouth 4 (four) times daily -  with meals and at bedtime. 12/01/14   Domenic Moras, PA-C  zolpidem (AMBIEN) 10 MG tablet Take 10 mg by mouth at bedtime as needed for sleep.    Historical Provider, MD   BP 124/73 mmHg  Pulse 83  Temp(Src) 98.1 F (36.7 C) (Oral)  Resp 18  SpO2 100% Physical Exam  Constitutional: He is oriented to person, place, and time. He appears well-developed and well-nourished.  HENT:  Head: Normocephalic and  atraumatic.  Eyes: Conjunctivae are normal. Pupils are equal, round, and reactive to light.  Neck: Neck supple. No tracheal deviation present. No thyromegaly present.  Cardiovascular: Normal rate and regular rhythm.   No murmur heard. Pulmonary/Chest: Effort normal and breath sounds normal.  Abdominal: Soft. Bowel sounds are normal. He exhibits no distension. There is no tenderness.  Musculoskeletal: Normal range of motion. He exhibits no edema or tenderness.  Neurological: He is alert and oriented to person, place, and time. Coordination normal.  Skin: Skin is warm and dry. No rash noted.  Psychiatric: He has a normal mood and affect.  Nursing note and vitals reviewed.   ED Course  Procedures (including critical care time) Labs Review Labs Reviewed - No data to display  Imaging Review Ct Abdomen Pelvis W Contrast  12/01/2014   CLINICAL DATA:  Diarrhea beginning 3 days ago. Vomiting subsequently with some hematemesis. Bloating.  EXAM: CT ABDOMEN AND PELVIS WITH CONTRAST  TECHNIQUE: Multidetector CT imaging of the abdomen and pelvis was performed using the standard protocol following bolus administration of intravenous contrast.  CONTRAST:  11mL OMNIPAQUE IOHEXOL 300 MG/ML  SOLN  COMPARISON:  10/22/2014 an multiple previous  FINDINGS: Lung bases are clear.  No pleural or pericardial fluid.  There is diffuse fatty change of the liver. No focal lesion. No calcified gallstones. The spleen is normal. The pancreas is normal. The adrenal glands are normal. The right kidney is normal. The left kidney is normal except for a few small cysts. Largest cyst the lower pole measures 4 cm. There is atherosclerosis of the aorta without aneurysm. The IVC is normal. No retroperitoneal mass or adenopathy. No visible bowel pathology. Fluid density stool is noted in the right colon. Bladder, prostate gland and seminal vesicles are normal. 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  IMPRESSION: Advanced fatty change of the liver diffusely.  Benign appearing renal cysts.  Fluid density stool in the right colon. No specific pathologic bowel finding.  Atherosclerosis of the aorta.  Degenerative disease of the lumbar spine.   Electronically Signed   By: Nelson Chimes M.D.   On: 12/01/2014 15:01   I have personally reviewed and evaluated these images and lab results as part of my medical decision-making.   EKG Interpretation None     4:20 PM Patient resting comfortably after treatment with intravenous fluids. Results for orders placed or performed during the hospital encounter of 12/03/14  Comprehensive metabolic panel  Result Value Ref Range   Sodium 135 135 - 145 mmol/L   Potassium 3.6 3.5 - 5.1 mmol/L   Chloride 98 (L) 101 - 111 mmol/L   CO2 28 22 - 32 mmol/L   Glucose, Bld 116 (H) 65 - 99 mg/dL   BUN 12 6 - 20 mg/dL   Creatinine, Ser 1.41 (H) 0.61 - 1.24 mg/dL   Calcium 8.9 8.9 - 10.3 mg/dL   Total Protein 8.1 6.5 - 8.1 g/dL   Albumin 4.2 3.5 - 5.0 g/dL   AST 79 (H) 15 - 41 U/L   ALT 70 (H) 17 - 63 U/L   Alkaline Phosphatase 80 38 - 126 U/L   Total Bilirubin 0.6 0.3 - 1.2 mg/dL   GFR calc non Af Amer 52 (L) >60 mL/min   GFR calc Af Amer >60 >60 mL/min   Anion gap 9 5 - 15  Ethanol  Result Value Ref Range   Alcohol, Ethyl (B) 206 (H) <5 mg/dL  CBC with Diff  Result Value Ref Range   WBC 12.7 (H) 4.0 - 10.5 K/uL   RBC 3.90 (L) 4.22 - 5.81 MIL/uL   Hemoglobin 13.9 13.0 - 17.0 g/dL   HCT 40.2 39.0 - 52.0 %   MCV 103.1 (H) 78.0 - 100.0 fL   MCH 35.6 (H) 26.0 - 34.0 pg   MCHC 34.6 30.0 - 36.0 g/dL   RDW 12.9 11.5 - 15.5 %   Platelets 295 150 - 400 K/uL   Neutrophils Relative % 48 %   Neutro Abs 6.2 1.7 - 7.7 K/uL   Lymphocytes Relative 38 %   Lymphs Abs 4.8 (H) 0.7 -  4.0 K/uL   Monocytes Relative 7 %   Monocytes Absolute 0.8 0.1 - 1.0 K/uL   Eosinophils Relative 6 %   Eosinophils Absolute 0.8 (H) 0.0 - 0.7 K/uL   Basophils Relative 1 %    Basophils Absolute 0.1 0.0 - 0.1 K/uL  Lipase, blood  Result Value Ref Range   Lipase 25 22 - 51 U/L   Ct Abdomen Pelvis W Contrast  12/01/2014   CLINICAL DATA:  Diarrhea beginning 3 days ago. Vomiting subsequently with some hematemesis. Bloating.  EXAM: CT ABDOMEN AND PELVIS WITH CONTRAST  TECHNIQUE: Multidetector CT imaging of the abdomen and pelvis was performed using the standard protocol following bolus administration of intravenous contrast.  CONTRAST:  71mL OMNIPAQUE IOHEXOL 300 MG/ML  SOLN  COMPARISON:  10/22/2014 an multiple previous  FINDINGS: Lung bases are clear.  No pleural or pericardial fluid.  There is diffuse fatty change of the liver. No focal lesion. No calcified gallstones. The spleen is normal. The pancreas is normal. The adrenal glands are normal. The right kidney is normal. The left kidney is normal except for a few small cysts. Largest cyst the lower pole measures 4 cm. There is atherosclerosis of the aorta without aneurysm. The IVC is normal. No retroperitoneal mass or adenopathy. No visible bowel pathology. Fluid density stool is noted in the right colon. Bladder, prostate gland and seminal vesicles are normal. There is advanced chronic degenerative disease of the lumbar spine but no acute bone finding.  IMPRESSION: Advanced fatty change of the liver diffusely.  Benign appearing renal cysts.  Fluid density stool in the right colon. No specific pathologic bowel finding.  Atherosclerosis of the aorta.  Degenerative disease of the lumbar spine.   Electronically Signed   By: Nelson Chimes M.D.   On: 12/01/2014 15:01    MDM  I spoke with TTS and with psychiatric nurse practitioner Ms.Jamison. Plan patient will be placed in 23 hour observation to watch for alcohol withdrawal.  Final diagnoses:  None   diagnosis #1 acute and chronic alcohol abuse #2 nausea ,vomiting, diarrhea #3 epigastric pain      Orlie Dakin, MD 12/03/14 1636

## 2014-12-03 NOTE — ED Notes (Addendum)
Felicity Pellegrini - pt's sister - will be available to transport pt home if he is dc'ed tomorrow.  Please call with any updates on disposition of patient - [phone].

## 2014-12-03 NOTE — BH Assessment (Addendum)
Assessment Note  Juan Wiggins is an 64 y.o. male presents to [facility] voluntarily requesting detox. Patient states that he has been drinking since age 36 and currently drinks "2 cups" of scotch per day. Patient states that he is trying to "cut back" on drinking and has been experiencing withdrawal symptoms. Patient states that he has been experiencing severe diarrhea and vomiting and came into the ED to receive help with detox. Patient states that he was in the ED about two days ago for the same symptoms. Patient states that he does not want treatment but would like detox. Patient states that he went to detox in [place] in March 2016. Patient denies SI/HI and AVH at this time.  Patient USD pending at time of assessment and BAL 206.  Patient is alert and oriented x4. Patient made good eye contact.  Patient was dressed in scrubs and reclined in the chair. Patient spoke clearly, logically, and coherently. Patients mood was anxious and patients mood and affect were congruent. Patient states that he is unable to eat due to diarrhea and vomiting and his sleeping habits have not changed due to taking Ambien. Patient states that he does not have a history of harming himself or having suicidal thoughts. Patient states that he feels like he's "shaking inside" and is suffering from anxiety and "shakes." Patient denies symptoms of depression or previous mental health treatment. Patient states that he has three upcoming court dates for DWI on November 3, 23, and 29th.  Consulted with Waylan Boga, DNP who recommends patient be observed overnight and evaluated by psychiatry in the morning.   Diagnosis: Alcohol use disorder, Severe   Past Medical History:  Past Medical History  Diagnosis Date  . Hypertension   . Thyroid disease   . Hemorrhoids   . Hypothyroidism   . Tachycardia     PT STATES HIS HEART RATE USUALLY 100 OR MORE  . Depression   . Anxiety   . GERD (gastroesophageal reflux disease)     PREVACID IF  NEEDED  . Arthritis     RHEUMATOID ARTHRITIS; OA LEFT KNEE  . 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 Sleep apnea     CLAUSTROPHOBIC - COULD NOT TOLERATE CPAP MASK  . Heart murmur   . Pancreatitis, alcoholic, acute   . Cancer ([place])     MELANOMA REMOVED RT SHOULDER    Past Surgical History  Procedure Laterality Date  . Knee surgery      BILATERAL KNEE ARTHROSCOPY  . Shoulder surgery      RIGHT ROTATOR CUFF REPAIR AND LEFT ARTHROSCOPY  . Elbow surgery      BILATERAL ELBOW SURGERY  . Refractive surgery    . Total knee arthroplasty Left 08/23/2013    Procedure: LEFT TOTAL KNEE ARTHROPLASTY;  Surgeon: Tobi Bastos, MD;  Location: [facility];  Service: Orthopedics;  Laterality: Left;  . Colonscopy     . Left tkr      July 2015  . Total knee arthroplasty Right 02/07/2014    Procedure: RIGHT TOTAL KNEE ARTHROPLASTY;  Surgeon: Tobi Bastos, MD;  Location[facility];  Service: Orthopedics;  Laterality: Right;  . Lumbar laminectomy/decompression microdiscectomy Left 06/06/2014    Procedure: LEFT L4-5 DECOMPRESSION ;  Surgeon: Melina Schools, MD;  Location: [facility];  Service: Orthopedics;  Laterality: Left;  Family History:  Family History  Problem Relation Age of Onset  . Hypertension Mother   . Other Mother     varicose veins  . Hypertension Sister     Social History:  reports that he quit smoking about 16 years ago. His smoking use included Cigars. He has never used smokeless tobacco. He reports that he does not drink alcohol or use illicit drugs.  Additional Social History:  Alcohol / Drug Use Pain Medications: See PTA Prescriptions: See PTA Over the Counter: See PTA History of alcohol / drug use?: Yes Negative Consequences of Use: Legal Withdrawal Symptoms: Nausea / Vomiting, Tingling Substance #1 Name of Substance 1: Alcohol 1 - Age of First Use: 17 1 - Amount  (size/oz): 2 cups of whiskey 1 - Frequency: daily 1 - Duration: ongoing 1 - Last Use / Amount: today  CIWA: CIWA-Ar BP: 124/73 mmHg Pulse Rate: 83 Nausea and Vomiting: 2 Tactile Disturbances: very mild itching, pins and needles, burning or numbness Tremor: not visible, but can be felt fingertip to fingertip Auditory Disturbances: not present Paroxysmal Sweats: no sweat visible Visual Disturbances: not present Anxiety: mildly anxious Headache, Fullness in Head: very mild Agitation: normal activity Orientation and Clouding of Sensorium: oriented and can do serial additions CIWA-Ar Total: 6 COWS:    Allergies: No Known Allergies  Home Medications:  (Not in a hospital admission)  OB/GYN Status:  No LMP for male patient.  General Assessment Data Location of Assessment: [facility] TTS Assessment: In system Is this a Tele or Face-to-Face Assessment?: Face-to-Face Is this an Initial Assessment or a Re-assessment for this encounter?: Initial Assessment Marital status: Married Is patient pregnant?: No Pregnancy Status: No Living Arrangements: Parent (father) Can pt return to current living arrangement?: Yes Admission Status: Voluntary Is patient capable of signing voluntary admission?: Yes Referral Source: Self/Family/Friend Insurance type: [place] Living Arrangements: Parent (father) Name of Psychiatrist: None Name of Therapist: None  Education Status Is patient currently in school?: No Highest grade of school patient has completed: HS Diplpoma  Risk to self with the past 6 months Suicidal Ideation: No Has patient been a risk to self within the past 6 months prior to admission? : No Suicidal Intent: No Has patient had any suicidal intent within the past 6 months prior to admission? : No Is patient at risk for suicide?: No Suicidal Plan?: No Has patient had any suicidal plan within the past 6 months prior to admission? : No Access to Means: No What has  been your use of drugs/alcohol within the last 12 months?: Alcohol daily Previous Attempts/Gestures: No How many times?: 0 Other Self Harm Risks:  (Denies) Triggers for Past Attempts: None known Intentional Self Injurious Behavior: None Family Suicide History: No Recent stressful life event(s): Legal Issues Persecutory voices/beliefs?: No Depression: No Substance abuse history and/or treatment for substance abuse?: Yes Suicide prevention information given to non-admitted patients: Not applicable  Risk to Others within the past 6 months Homicidal Ideation: No Does patient have any lifetime risk of violence toward others beyond the six months prior to admission? : No Thoughts of Harm to Others: No Current Homicidal Intent: No Current Homicidal Plan: No Access to Homicidal Means: No Identified Victim: Denies History of harm to others?: No Assessment of Violence: None Noted Violent Behavior Description: Denies Does patient have access to weapons?: No Criminal Charges Pending?: Yes Describe Pending Criminal Charges: 3 DWI's Does patient have a court date: Yes Court Date: 12/27/14  Is patient on probation?: No  Psychosis Hallucinations: None noted Delusions: None noted  Mental Status Report Appearance/Hygiene: In scrubs Eye Contact: Good Motor Activity: Unable to assess Speech: Logical/coherent Level of Consciousness: Alert Mood: Anxious, Pleasant Affect: Anxious, Appropriate to circumstance Anxiety Level: Moderate Thought Processes: Coherent, Relevant Judgement: Unimpaired Orientation: Person, Place, Time, Situation, Appropriate for developmental age Obsessive Compulsive Thoughts/Behaviors: None  Cognitive Functioning Concentration: Decreased Memory: Recent Intact, Remote Intact IQ: Average Insight: Fair Impulse Control: Fair Vegetative Symptoms: None  ADLScreening [facility] Assessment Services) Patient's cognitive ability adequate to safely complete daily activities?:  Yes Patient able to express need for assistance with ADLs?: Yes Independently performs ADLs?: Yes (appropriate for developmental age)  Prior Inpatient Therapy Prior Inpatient Therapy: Yes Prior Therapy Dates: 04/2014 Prior Therapy Facilty/Provider(s): [facility] (in [place]) Reason for Treatment: Detox  Prior Outpatient Therapy Prior Outpatient Therapy: No Prior Therapy Dates: N/A Prior Therapy Facilty/Provider(s): N/A Reason for Treatment: N/A Does patient have an ACCT team?: No Does patient have Intensive In-House Services?  : No Does patient have [facility] services? : No Does patient have P4CC services?: No  ADL Screening (condition at time of admission) Patient's cognitive ability adequate to safely complete daily activities?: Yes Is the patient deaf or have difficulty hearing?: No Does the patient have difficulty seeing, even when wearing glasses/contacts?: No Does the patient have difficulty concentrating, remembering, or making decisions?: No Patient able to express need for assistance with ADLs?: Yes Does the patient have difficulty dressing or bathing?: No Independently performs ADLs?: Yes (appropriate for developmental age) Does the patient have difficulty walking or climbing stairs?: Yes Weakness of Legs: Both Weakness of Arms/Hands: Both  Home Assistive Devices/Equipment Home Assistive Devices/Equipment: Contact lenses  Therapy Consults (therapy consults require a physician order) PT Evaluation Needed: No OT Evalulation Needed: No SLP Evaluation Needed: No Abuse/Neglect Assessment (Assessment to be complete while patient is alone) Physical Abuse: Denies Verbal Abuse: Denies Sexual Abuse: Denies Exploitation of patient/patient's resources: Denies Self-Neglect: Denies Values / Beliefs Cultural Requests During Hospitalization: None Spiritual Requests During Hospitalization: None Consults Spiritual Care Consult Needed: No Social Work Consult Needed: No Armed forces training and education officer (For Healthcare) Does patient have an advance directive?: No Would patient like information on creating an advanced directive?: No - patient declined information Nutrition Screen- [facility] Patient's home diet: Regular Has the patient recently lost weight without trying?: No Has the patient been eating poorly because of a decreased appetite?: Yes Malnutrition Screening Tool Score: 1  Additional Information 1:1 In Past 12 Months?: No CIRT Risk: No Elopement Risk: No Does patient have medical clearance?: No     Disposition:  Disposition Initial Assessment Completed for this Encounter: Yes Disposition of Patient: Other dispositions Other disposition(s): Other (Comment) (observe overnight)  On Site Evaluation by:   Reviewed with Physician:    Kalel Harty 12/03/2014 7:47 PM

## 2014-12-03 NOTE — [facility] Counselor (Signed)
Consulted with Waylan Boga, DNP who recommends patient be observed overnight in the ED and evaluated by psychiatry tomorrow. Informed EDP of disposition.  Rosalin Hawking, LCSW Therapeutic Triage Specialist [place] 12/03/2014 5:24 PM

## 2014-12-04 DIAGNOSIS — F1020 Alcohol dependence, uncomplicated: Secondary | ICD-10-CM | POA: Diagnosis not present

## 2014-12-04 LAB — RAPID URINE DRUG SCREEN, HOSP PERFORMED
AMPHETAMINES: NOT DETECTED
BARBITURATES: NOT DETECTED
BENZODIAZEPINES: POSITIVE — AB
COCAINE: POSITIVE — AB
Opiates: NOT DETECTED
Tetrahydrocannabinol: POSITIVE — AB

## 2014-12-04 MED ORDER — LOPERAMIDE HCL 2 MG PO CAPS
2.0000 mg | ORAL_CAPSULE | Freq: Two times a day (BID) | ORAL | Status: DC | PRN
  Administered 2014-12-04 – 2014-12-05 (×3): 2 mg via ORAL
  Filled 2014-12-04 (×3): qty 1

## 2014-12-04 MED ORDER — ONDANSETRON 4 MG PO TBDP
4.0000 mg | ORAL_TABLET | Freq: Three times a day (TID) | ORAL | Status: DC | PRN
  Administered 2014-12-04 – 2014-12-05 (×2): 4 mg via ORAL
  Filled 2014-12-04 (×2): qty 1

## 2014-12-04 NOTE — Consult Note (Signed)
[facility] Consult   Reason for Consult:  Alcohol use disorder, severe,  Referring Physician:  EDP Patient Identification: Juan Wiggins MRN:  [id number] Principal Diagnosis: Alcohol dependence ([place]) Diagnosis:   Patient Active Problem List   Diagnosis Date Noted  . Alcohol dependence ([place]) [F10.20] 07/14/2013    Priority: High  . Back pain [M54.9] 06/06/2014  . Acute pancreatitis [K85.90] 05/21/2014  . Chronic pain syndrome [G89.4] 05/21/2014  . HTN (hypertension) [I10] 05/21/2014  . Cellulitis and abscess of leg [L02.419, L03.119] 09/01/2013  . Cellulitis left knee [L03.90] 08/31/2013  . Essential hypertension, benign [I10] 08/28/2013  . Acute blood loss anemia [D62] 08/28/2013  . GERD (gastroesophageal reflux disease) [K21.9] 08/28/2013  . Osteoarthritis of left knee [M17.9] 08/23/2013  . Total knee replacement status [Z96.659] 08/23/2013  . Alcohol dependency ([place]) [F10.20] 07/07/2013  . Major depression ([place]) [F32.9] 07/07/2013  . Alcohol dependence with alcohol-induced disorder ([place]) [F10.29] 07/07/2013  . 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 Recurrent falls [R29.6] 04/14/2011  . Hypotension [458] 04/13/2011  . ARF (acute renal failure) ([place]) [N17.9] 04/13/2011  . Hypothyroidism [E03.9] 04/13/2011  . Prolapsed and thrombosed [K64.8] 09/03/2010    Total Time spent with patient: 1 hour  Subjective:   Juan Wiggins is a 64 y.o. male patient admitted with Alcohol use disorder  HPI:  This is a 64 years old caucasian male who was evaluated this morning for Alcohol withdrawal symptoms.  Patient was admitted last night with an Alcohol level of 206.   His UDS also is negative for Benzodiazepine, Marijuana and Cocaine. This morning he is complaining of nausea, have vomited x 1 and he reports that his stomach feels "qizzy"  Patient admits to consuming too much Alcohol and states that he drinks daily.  His longest period  of sobriety is 4 months at a time.  He drinks 2 glasses of Scotch and some beer daily.  His Alcohol intake has put him trouble with the law for DWI and he has 3 court dates coming up in October and November.  Patient is having visible tremors and shakes, he denies Alcohol withdrawal seizures but reports "rough withdrawal symptoms"  He denies SI/HI/AVH.  He has been accepted for observation and we will re-evaluate in am.  Past Psychiatric History:  Recurrent Major depressive disorder  Risk to Self: Suicidal Ideation: No Suicidal Intent: No Is patient at risk for suicide?: No Suicidal Plan?: No Access to Means: No What has been your use of drugs/alcohol within the last 12 months?: Alcohol daily How many times?: 0 Other Self Harm Risks:  (Denies) Triggers for Past Attempts: None known Intentional Self Injurious Behavior: None Risk to Others: Homicidal Ideation: No Thoughts of Harm to Others: No Current Homicidal Intent: No Current Homicidal Plan: No Access to Homicidal Means: No Identified Victim: Denies History of harm to others?: No Assessment of Violence: None Noted Violent Behavior Description: Denies Does patient have access to weapons?: No Criminal Charges Pending?: Yes Describe Pending Criminal Charges: 3 DWI's Does patient have a court date: Yes Court Date: 12/27/14 Prior Inpatient Therapy: Prior Inpatient Therapy: Yes Prior Therapy Dates: 04/2014 Prior Therapy Facilty/Provider(s): [facility] (in [place]) Reason for Treatment: Detox Prior Outpatient Therapy: Prior Outpatient Therapy: No Prior Therapy Dates: N/A Prior Therapy Facilty/Provider(s): N/A Reason for Treatment: N/A Does patient have an ACCT team?: No Does patient have Intensive In-House Services?  : No Does patient have [facility] services? : No Does patient have  P4CC services?: No  Past Medical History:  Past Medical History  Diagnosis Date  . Hypertension   . Thyroid disease   . Hemorrhoids   . Hypothyroidism    . Tachycardia     PT STATES HIS HEART RATE USUALLY 100 OR MORE  . Depression   . Anxiety   . GERD (gastroesophageal reflux disease)     PREVACID IF NEEDED  . Arthritis     RHEUMATOID ARTHRITIS; OA LEFT KNEE  . 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 Sleep apnea     CLAUSTROPHOBIC - COULD NOT TOLERATE CPAP MASK  . Heart murmur   . Pancreatitis, alcoholic, acute   . Cancer ([place])     MELANOMA REMOVED RT SHOULDER    Past Surgical History  Procedure Laterality Date  . Knee surgery      BILATERAL KNEE ARTHROSCOPY  . Shoulder surgery      RIGHT ROTATOR CUFF REPAIR AND LEFT ARTHROSCOPY  . Elbow surgery      BILATERAL ELBOW SURGERY  . Refractive surgery    . Total knee arthroplasty Left 08/23/2013    Procedure: LEFT TOTAL KNEE ARTHROPLASTY;  Surgeon: Tobi Bastos, MD;  Location: [facility];  Service: Orthopedics;  Laterality: Left;  . Colonscopy     . Left tkr      July 2015  . Total knee arthroplasty Right 02/07/2014    Procedure: RIGHT TOTAL KNEE ARTHROPLASTY;  Surgeon: Tobi Bastos, MD;  Location: [facility];  Service: Orthopedics;  Laterality: Right;  . Lumbar laminectomy/decompression microdiscectomy Left 06/06/2014    Procedure: LEFT L4-5 DECOMPRESSION ;  Surgeon: Melina Schools, MD;  Location: [facility];  Service: Orthopedics;  Laterality: Left;   Family History:  Family History  Problem Relation Age of Onset  . Hypertension Mother   . Other Mother     varicose veins  . Hypertension Sister    Family Psychiatric  History:  Denies any knowledge of family hx of Mental illness Social History:  History  Alcohol Use No    Comment: " last alcohol use was in December 2015."     History  Drug Use No    Social History   Social History  . Marital Status: Legally Separated    Spouse Name: N/A  . Number of Children: N/A  . Years of Education: N/A   Social History Main  Topics  . Smoking status: Former Smoker -- 1.50 packs/day for 35 years    Types: Cigars    Quit date: 02/22/1998  . Smokeless tobacco: Never Used     Comment: used cigarettes 20 years ago  . Alcohol Use: No     Comment: " last alcohol use was in December 2015."  . Drug Use: No  . Sexual Activity: Yes    Birth Control/ Protection: None   Other Topics Concern  . None   Social History Narrative   Additional Social History:    Pain Medications: See PTA Prescriptions: See PTA Over the Counter: See PTA History of alcohol / drug use?: Yes Negative Consequences of Use: Legal Withdrawal Symptoms: Nausea / Vomiting, Tingling Name of Substance 1: Alcohol 1 - Age of First Use: 17 1 - Amount (size/oz): 2 cups of whiskey 1 - Frequency: daily 1 - Duration: ongoing 1 - Last Use / Amount: today  Allergies:  No Known Allergies  Labs:  Results for orders placed or performed during the hospital encounter of 12/03/14 (from the past 48 hour(s))  Comprehensive metabolic panel     Status: Abnormal   Collection Time: 12/03/14 12:35 PM  Result Value Ref Range   Sodium 135 135 - 145 mmol/L   Potassium 3.6 3.5 - 5.1 mmol/L   Chloride 98 (L) 101 - 111 mmol/L   CO2 28 22 - 32 mmol/L   Glucose, Bld 116 (H) 65 - 99 mg/dL   BUN 12 6 - 20 mg/dL   Creatinine, Ser 1.41 (H) 0.61 - 1.24 mg/dL   Calcium 8.9 8.9 - 10.3 mg/dL   Total Protein 8.1 6.5 - 8.1 g/dL   Albumin 4.2 3.5 - 5.0 g/dL   AST 79 (H) 15 - 41 U/L   ALT 70 (H) 17 - 63 U/L   Alkaline Phosphatase 80 38 - 126 U/L   Total Bilirubin 0.6 0.3 - 1.2 mg/dL   GFR calc non Af Amer 52 (L) >60 mL/min   GFR calc Af Amer >60 >60 mL/min    Comment: (NOTE) The eGFR has been calculated using the CKD EPI equation. This calculation has not been validated in all clinical situations. eGFR's persistently <60 mL/min signify possible Chronic Kidney Disease.    Anion gap 9 5 - 15  Ethanol     Status: Abnormal   Collection Time:  12/03/14 12:35 PM  Result Value Ref Range   Alcohol, Ethyl (B) 206 (H) <5 mg/dL    Comment:        LOWEST DETECTABLE LIMIT FOR SERUM ALCOHOL IS 5 mg/dL FOR MEDICAL PURPOSES ONLY   CBC with Diff     Status: Abnormal   Collection Time: 12/03/14 12:35 PM  Result Value Ref Range   WBC 12.7 (H) 4.0 - 10.5 K/uL   RBC 3.90 (L) 4.22 - 5.81 MIL/uL   Hemoglobin 13.9 13.0 - 17.0 g/dL   HCT 40.2 39.0 - 52.0 %   MCV 103.1 (H) 78.0 - 100.0 fL   MCH 35.6 (H) 26.0 - 34.0 pg   MCHC 34.6 30.0 - 36.0 g/dL   RDW 12.9 11.5 - 15.5 %   Platelets 295 150 - 400 K/uL   Neutrophils Relative % 48 %   Neutro Abs 6.2 1.7 - 7.7 K/uL   Lymphocytes Relative 38 %   Lymphs Abs 4.8 (H) 0.7 - 4.0 K/uL   Monocytes Relative 7 %   Monocytes Absolute 0.8 0.1 - 1.0 K/uL   Eosinophils Relative 6 %   Eosinophils Absolute 0.8 (H) 0.0 - 0.7 K/uL   Basophils Relative 1 %   Basophils Absolute 0.1 0.0 - 0.1 K/uL  Lipase, blood     Status: None   Collection Time: 12/03/14 12:35 PM  Result Value Ref Range   Lipase 25 22 - 51 U/L  Urine rapid drug screen (hosp performed)not at [facility]     Status: Abnormal   Collection Time: 12/04/14  9:03 AM  Result Value Ref Range   Opiates NONE DETECTED NONE DETECTED   Cocaine POSITIVE (A) NONE DETECTED   Benzodiazepines POSITIVE (A) NONE DETECTED   Amphetamines NONE DETECTED NONE DETECTED   Tetrahydrocannabinol POSITIVE (A) NONE DETECTED   Barbiturates NONE DETECTED NONE DETECTED    Comment:        DRUG SCREEN FOR MEDICAL PURPOSES ONLY.  IF CONFIRMATION IS NEEDED FOR ANY PURPOSE, NOTIFY LAB WITHIN 5 DAYS.        LOWEST  DETECTABLE LIMITS FOR URINE DRUG SCREEN Drug Class       Cutoff (ng/mL) Amphetamine      1000 Barbiturate      200 Benzodiazepine   536 Tricyclics       468 Opiates          300 Cocaine          300 THC              50     Current Facility-Administered Medications  Medication Dose Route Frequency Provider Last Rate Last Dose  . cholecalciferol (VITAMIN D)  tablet 3,000 Units  3,000 Units Oral Daily Sharlett Iles, MD   3,000 Units at 12/04/14 1030  . escitalopram (LEXAPRO) tablet 20 mg  20 mg Oral q morning - 10a Sharlett Iles, MD   20 mg at 12/04/14 1030  . folic acid (FOLVITE) tablet 1 mg  1 mg Oral Daily Orlie Dakin, MD   1 mg at 12/04/14 1030  . levothyroxine (SYNTHROID, LEVOTHROID) tablet 175 mcg  175 mcg Oral QAC breakfast Sharlett Iles, MD   175 mcg at 12/04/14 [phone]  . lisinopril (PRINIVIL,ZESTRIL) tablet 20 mg  20 mg Oral Q breakfast Sharlett Iles, MD   20 mg at 12/04/14 2248  . loperamide (IMODIUM) capsule 2 mg  2 mg Oral BID PRN Davonna Belling, MD   2 mg at 12/04/14 2500  . LORazepam (ATIVAN) tablet 1 mg  1 mg Oral Q6H PRN Orlie Dakin, MD   1 mg at 12/04/14 0831   Or  . LORazepam (ATIVAN) injection 1 mg  1 mg Intravenous Q6H PRN Orlie Dakin, MD   1 mg at 12/03/14 1434  . multivitamin with minerals tablet 1 tablet  1 tablet Oral Daily Orlie Dakin, MD   1 tablet at 12/04/14 1030  . OLANZapine-FLUoxetine (SYMBYAX) 12-50 MG per capsule 1 capsule  1 capsule Oral QHS Sharlett Iles, MD   1 capsule at 12/03/14 2345  . ondansetron (ZOFRAN-ODT) disintegrating tablet 4 mg  4 mg Oral Q8H PRN Davonna Belling, MD   4 mg at 12/04/14 3704  . sucralfate (CARAFATE) tablet 1 g  1 g Oral TID WC & HS Sharlett Iles, MD   1 g at 12/04/14 0831  . thiamine (VITAMIN B-1) tablet 100 mg  100 mg Oral Daily Orlie Dakin, MD   100 mg at 12/04/14 1030   Or  . thiamine (B-1) injection 100 mg  100 mg Intravenous Daily Orlie Dakin, MD   100 mg at 12/03/14 1434  . zolpidem (AMBIEN) tablet 10 mg  10 mg Oral QHS PRN Sharlett Iles, MD   10 mg at 12/03/14 2346   Current Outpatient Prescriptions  Medication Sig Dispense Refill  . ALPRAZolam (XANAX) 0.25 MG tablet Take 0.25 mg by mouth at bedtime as needed. anxiety    . cholecalciferol (VITAMIN D) 1000 UNITS tablet Take 3,000 Units by mouth daily.    Marland Kitchen  dicyclomine (BENTYL) 20 MG tablet Take 1 tablet (20 mg total) by mouth 2 (two) times daily. 20 tablet 0  . DiphenhydrAMINE HCl, Sleep, (ZZZQUIL PO) Take 1 Dose by mouth at bedtime as needed (sleep).    . Doxylamine Succinate, Sleep, (SLEEP AID PO) Take 2 capsules by mouth at bedtime as needed (sleep).    Marland Kitchen escitalopram (LEXAPRO) 20 MG tablet Take 20 mg by mouth every morning.    Marland Kitchen levothyroxine (SYNTHROID, LEVOTHROID) 175 MCG tablet Take 175 mcg by mouth daily before  breakfast.    . lisinopril (PRINIVIL,ZESTRIL) 20 MG tablet Take 20 mg by mouth daily with breakfast.    . OLANZapine-FLUoxetine (SYMBYAX) 12-50 MG per capsule Take 1 capsule by mouth at bedtime.    . ondansetron (ZOFRAN) 4 MG tablet Take 1 tablet (4 mg total) by mouth every 8 (eight) hours as needed for nausea or vomiting. 20 tablet 0  . OVER THE COUNTER MEDICATION Place into both eyes daily as needed (contacts.). Flushes eyes    . oxyCODONE (ROXICODONE) 30 MG immediate release tablet Take 1 tablet (30 mg total) by mouth every 6 (six) hours as needed for severe pain. (Patient taking differently: Take 30 mg by mouth 4 (four) times daily. ) 50 tablet 0  . promethazine (PHENERGAN) 12.5 MG tablet Take 12.5 mg by mouth every 6 (six) hours as needed for nausea or vomiting.    . sucralfate (CARAFATE) 1 G tablet Take 1 tablet (1 g total) by mouth 4 (four) times daily -  with meals and at bedtime. 10 tablet 0  . zolpidem (AMBIEN) 10 MG tablet Take 10 mg by mouth at bedtime as needed for sleep.    . folic acid (FOLVITE) 1 MG tablet Take 1 tablet (1 mg total) by mouth daily. (Patient not taking: Reported on 06/04/2014) 30 tablet 0  . LORazepam (ATIVAN) 1 MG tablet Take 1 tablet (1 mg total) by mouth every 6 (six) hours as needed (CIWA-AR > 8  -OR-  withdrawal symptoms:  anxiety, agitation, insomnia, diaphoresis, nausea, vomiting, tremors, tachycardia, or hypertension.). (Patient not taking: Reported on 10/22/2014) 15 tablet 0  . methocarbamol  (ROBAXIN) 500 MG tablet Take 1 tablet (500 mg total) by mouth 3 (three) times daily as needed for muscle spasms. (Patient not taking: Reported on 10/22/2014) 60 tablet 0    Musculoskeletal: Strength & Muscle Tone: within normal limits Gait & Station: normal Patient leans: N/A  Psychiatric Specialty Exam: Review of Systems  Constitutional: Negative.   HENT: Negative.   Eyes: Negative.   Respiratory: Negative.   Cardiovascular: Negative.   Gastrointestinal: Negative.   Genitourinary: Negative.   Musculoskeletal: Negative.   Skin: Negative.   Neurological: Negative.   Endo/Heme/Allergies: Negative.   See PMH as documented  Blood pressure 143/84, pulse 84, temperature 98.6 F (37 C), temperature source Oral, resp. rate 20, SpO2 97 %.There is no weight on file to calculate BMI.  General Appearance: Casual  Eye Contact::  Good  Speech:  Garbled  Volume:  Normal  Mood:  Anxious and Depressed  Affect:  Congruent and Depressed  Thought Process:  Goal Directed  Orientation:  Full (Time, Place, and Person)  Thought Content:  WDL  Suicidal Thoughts:  No  Homicidal Thoughts:  No  Memory:  Immediate;   Good Recent;   Fair Remote;   Fair  Judgement:  Fair  Insight:  Fair  Psychomotor Activity:  Tremor and shakes  Concentration:  Fair  Recall:  NA  Fund of Knowledge:Fair  Language: Good  Akathisia:  NA  Handed:  Right  AIMS (if indicated):     Assets:  Desire for Improvement  ADL's:  Intact  Cognition: WNL  Sleep:      Treatment Plan Summary: Daily contact with patient to assess and evaluate symptoms and progress in treatment and Medication management  Disposition: Observe overnight and re-evaluate in am.   Initiated Alcohol detox treatment using our Ativan protocol.  Resume home medications.  Delfin Gant    PMHNP-BC 12/04/2014 11:36 AM Patient  seen face-to-face for psychiatric evaluation, chart reviewed and case discussed with the physician extender and developed  treatment plan. Reviewed the information documented and agree with the treatment plan. Corena Pilgrim, MD

## 2014-12-04 NOTE — ED Notes (Signed)
Pt provided with caffeine-free coke and a toothbrush and toothpaste. States he feels good at present.

## 2014-12-05 DIAGNOSIS — F331 Major depressive disorder, recurrent, moderate: Secondary | ICD-10-CM | POA: Insufficient documentation

## 2014-12-05 MED ORDER — TRAZODONE HCL 100 MG PO TABS: 100.0000 mg | ORAL_TABLET | Freq: Every day | ORAL | Status: DC

## 2014-12-05 NOTE — Consult Note (Signed)
Psychiatric Specialty Exam: Physical Exam  ROS  Blood pressure 151/78, pulse 72, temperature 99.1 F (37.3 C), temperature source Oral, resp. rate 18, SpO2 98 %.There is no weight on file to calculate BMI.  General Appearance: Casual  Eye Contact:: Good  Speech: clear and coherent  Volume: Normal  Mood: Anxious and Depressed  Affect: Congruent and Depressed  Thought Process: Goal Directed  Orientation: Full (Time, Place, and Person)  Thought Content: WDL  Suicidal Thoughts: No  Homicidal Thoughts: No  Memory: Immediate; Good Recent; Fair Remote; Fair  Judgement: Fair  Insight: Fair  Psychomotor Activity: Tremor and shakes  Concentration: Fair  Recall: NA  Fund of Knowledge:Fair  Language: Good  Akathisia: NA  Handed: Right  AIMS (if indicated):    Assets: Desire for Improvement  ADL's: Intact  Cognition: WNL         Patient was seen today still having tremors.  He vomited x 1 this morning.  Patient adamantly declined an offer for admission for detox treatment and stabilization.  He has a bed assigned to him but refused to come in for admission.  His sister who is his power of attorney has agreed to to take him home despite the fact that she has been informed of the consequences associated with Alcohol withdrawal symptoms without treatment.  Patient denies SI/HI/AVH and is discharged home.  Patient declined outpatient referral or treatment.  Alcohol dependence ([place])   Plan:  Discharge home   Charmaine Downs   PMHNP-BC Patient seen face-to-face for psychiatric evaluation, chart reviewed and case discussed with the physician extender and developed treatment plan. Reviewed the information documented and agree with the treatment plan. Corena Pilgrim, MD

## 2014-12-05 NOTE — [facility] Suicide Risk Assessment (Cosign Needed)
Suicide Risk Assessment  Discharge Assessment   [facility] Discharge Suicide Risk Assessment   Demographic Factors:  Male, Caucasian and Unemployed  Total Time spent with patient: 20 minutes  Musculoskeletal: Strength & Muscle Tone: within normal limits Gait & Station: normal Patient leans: N/A  Psychiatric Specialty Exam:     Blood pressure 157/94, pulse 88, temperature 98.9 F (37.2 C), temperature source Oral, resp. rate 20, SpO2 98 %.There is no weight on file to calculate BMI.  General Appearance: Casual  Eye Contact:: Good  Speech: clear and coherent  Volume: Normal  Mood: Anxious and Depressed  Affect: Congruent and Depressed  Thought Process: Goal Directed  Orientation: Full (Time, Place, and Person)  Thought Content: WDL  Suicidal Thoughts: No  Homicidal Thoughts: No  Memory: Immediate; Good Recent; Fair Remote; Fair  Judgement: Fair  Insight: Fair  Psychomotor Activity: Tremor and shakes  Concentration: Fair  Recall: NA  Fund of Knowledge:Fair  Language: Good  Akathisia: NA  Handed: Right  AIMS (if indicated):   Assets: Desire for Improvement  ADL's: Intact  Cognition: WNL                  Has this patient used any form of tobacco in the last 30 days? (Cigarettes, Smokeless Tobacco, Cigars, and/or Pipes) Yes, A prescription for an FDA-approved tobacco cessation medication was offered at discharge and the patient refused  Mental Status Per Nursing Assessment::   On Admission:     Current Mental Status by Physician: NA  Loss Factors: NA  Historical Factors: NA  Risk Reduction Factors:   Religious beliefs about death, Living with another person, especially a relative and Positive coping skills or problem solving skills  Continued Clinical Symptoms:  Depression:   Insomnia Alcohol/Substance Abuse/Dependencies  Cognitive Features That Contribute To Risk:  Polarized  thinking    Suicide Risk:  Minimal: No identifiable suicidal ideation.  Patients presenting with no risk factors but with morbid ruminations; may be classified as minimal risk based on the severity of the depressive symptoms  Principal Problem: Alcohol dependence [facility]) Discharge Diagnoses:  Patient Active Problem List   Diagnosis Date Noted  . Alcohol dependence ([place]) [F10.20] 07/14/2013    Priority: High  . Moderate episode of recurrent major depressive disorder ([place]) [F33.1]   . Back pain [M54.9] 06/06/2014  . Acute pancreatitis [K85.90] 05/21/2014  . Chronic pain syndrome [G89.4] 05/21/2014  . HTN (hypertension) [I10] 05/21/2014  . Cellulitis and abscess of leg [L02.419, L03.119] 09/01/2013  . Cellulitis left knee [L03.90] 08/31/2013  . Essential hypertension, benign [I10] 08/28/2013  . Acute blood loss anemia [D62] 08/28/2013  . GERD (gastroesophageal reflux disease) [K21.9] 08/28/2013  . Osteoarthritis of left knee [M17.9] 08/23/2013  . Total knee replacement status [Z96.659] 08/23/2013  . Alcohol dependency ([place]) [F10.20] 07/07/2013  . Major depression ([place]) [F32.9] 07/07/2013  . Alcohol dependence with alcohol-induced disorder ([place]) [F10.29] 07/07/2013  . Varicose veins of lower extremities with other complications [E32.122] 48/25/0037  . Recurrent falls [R29.6] 04/14/2011  . Hypotension [458] 04/13/2011  . ARF (acute renal failure) ([place]) [N17.9] 04/13/2011  . Hypothyroidism [E03.9] 04/13/2011  . Prolapsed and thrombosed [K64.8] 09/03/2010      Plan Of Care/Follow-up recommendations:  Activity:  as tolerated Diet:  Regular  Is patient on multiple antipsychotic therapies at discharge:  No   Has Patient had three or more failed trials of antipsychotic monotherapy by history:  No  Recommended Plan for Multiple Antipsychotic Therapies: NA    Stuart Mirabile C  PMHNP-BC 12/05/2014, 2:24 PM

## 2014-12-05 NOTE — ED Notes (Signed)
Pt approached desk and stated that he had an episode of diarrhea and was nauseated

## 2014-12-05 NOTE — ED Notes (Signed)
Pt's IV came out while pt was sleeping/

## 2014-12-05 NOTE — BH Assessment (Signed)
[place] Assessment Progress Note  Per Corena Pilgrim, MD, this pt would benefit from admission to [facility] for detoxification from alcohol.  However, pt is resistant to being admitted.  Pt's sister and power of attorney, Felicity Pellegrini, presents at the ED, and pt has signed Consent to Release Information to permit discussion with her regarding disposition.  At the request of Dr. Darleene Cleaver, this writer discussed pt's clinical presentation with the pt and Ms Kenton Kingfisher, including the potential for lethal alcohol withdrawal complications.  I also informed them that a bed is currently available at [facility].  After discussing the matter privately, Ms Kenton Kingfisher and the pt have decided that they would prefer for pt to be discharged from [facility].  Pt was offered outpatient substance abuse treatment resources and has refused these as well.  Charmaine Downs, NP, concurs with this decision.  Pt's nurse has been notified.  Jalene Mullet, [place] Triage Specialist [phone]

## 2014-12-05 NOTE — BH Assessment (Signed)
Reassessment 12/05/2014:  Writer met with patient to complete a re-assessment. Patient states that he has been drinking since age 64 and currently drinks "2 cups" of scotch per day. Patient explains that he did in fact try to cut back. Patient states that he is trying to "cut back" on drinking and has been experiencing withdrawal symptoms. Today patient has visible tremors. He reports no further symptoms. Writer gave patient the option of participating in a detox program. Patient declined requesting to go home. Patient stating that he is not interested in substance abuse treatment at all. Writer asked patient if he would at least take outpatient referrals and he agreed. Patient denies SI, HI, and AVH's.   Writer informed psychiatrist-Dr. Darleene Cleaver and Psychiatric Nurse Practioner-Josephine, NP that patient was offered inpatient detox but refused. Due to the extent of alcohol related withdrawal symptoms the psychiatric providers continue to recommend inpatient treatment. Patient will be given the option to sign himself into [facility] or he will be placed under IVC by Dr. Darleene Cleaver.

## 2015-02-24 DIAGNOSIS — R569 Unspecified convulsions: Secondary | ICD-10-CM

## 2015-02-24 DIAGNOSIS — N179 Acute kidney failure, unspecified: Secondary | ICD-10-CM

## 2015-02-24 DIAGNOSIS — K852 Alcohol induced acute pancreatitis without necrosis or infection: Secondary | ICD-10-CM

## 2015-02-24 HISTORY — DX: Alcohol induced acute pancreatitis without necrosis or infection: K85.20

## 2015-02-24 HISTORY — DX: Acute kidney failure, unspecified: N17.9

## 2015-02-24 HISTORY — DX: Unspecified convulsions: R56.9

## 2015-07-14 ENCOUNTER — Inpatient Hospital Stay (HOSPITAL_COMMUNITY): Payer: 59 | Admitting: Anesthesiology

## 2015-07-14 ENCOUNTER — Inpatient Hospital Stay (HOSPITAL_BASED_OUTPATIENT_CLINIC_OR_DEPARTMENT_OTHER)
Admission: EM | Admit: 2015-07-14 | Discharge: 2015-07-21 | DRG: 485 | Disposition: A | Discharge status: Home Health | Payer: 59 | Attending: Family Medicine | Admitting: Family Medicine

## 2015-07-14 ENCOUNTER — Emergency Department (HOSPITAL_BASED_OUTPATIENT_CLINIC_OR_DEPARTMENT_OTHER): Payer: 59

## 2015-07-14 ENCOUNTER — Encounter (HOSPITAL_COMMUNITY)
Admission: EM | Disposition: A | Discharge status: Home Health | Payer: Self-pay | Source: Home / Self Care | Attending: Family Medicine

## 2015-07-14 ENCOUNTER — Encounter (HOSPITAL_BASED_OUTPATIENT_CLINIC_OR_DEPARTMENT_OTHER): Payer: Self-pay | Admitting: Emergency Medicine

## 2015-07-14 ENCOUNTER — Inpatient Hospital Stay (HOSPITAL_COMMUNITY): Payer: 59

## 2015-07-14 DIAGNOSIS — A409 Streptococcal sepsis, unspecified: Secondary | ICD-10-CM | POA: Diagnosis present

## 2015-07-14 DIAGNOSIS — R7881 Bacteremia: Secondary | ICD-10-CM | POA: Diagnosis present

## 2015-07-14 DIAGNOSIS — J189 Pneumonia, unspecified organism: Secondary | ICD-10-CM | POA: Diagnosis present

## 2015-07-14 DIAGNOSIS — E875 Hyperkalemia: Secondary | ICD-10-CM | POA: Diagnosis present

## 2015-07-14 DIAGNOSIS — T8453XA Infection and inflammatory reaction due to internal right knee prosthesis, initial encounter: Principal | ICD-10-CM | POA: Diagnosis present

## 2015-07-14 DIAGNOSIS — G894 Chronic pain syndrome: Secondary | ICD-10-CM | POA: Diagnosis present

## 2015-07-14 DIAGNOSIS — F1020 Alcohol dependence, uncomplicated: Secondary | ICD-10-CM | POA: Diagnosis present

## 2015-07-14 DIAGNOSIS — Z96653 Presence of artificial knee joint, bilateral: Secondary | ICD-10-CM | POA: Diagnosis present

## 2015-07-14 DIAGNOSIS — F411 Generalized anxiety disorder: Secondary | ICD-10-CM | POA: Diagnosis present

## 2015-07-14 DIAGNOSIS — D649 Anemia, unspecified: Secondary | ICD-10-CM | POA: Diagnosis present

## 2015-07-14 DIAGNOSIS — E039 Hypothyroidism, unspecified: Secondary | ICD-10-CM | POA: Diagnosis present

## 2015-07-14 DIAGNOSIS — K219 Gastro-esophageal reflux disease without esophagitis: Secondary | ICD-10-CM | POA: Diagnosis present

## 2015-07-14 DIAGNOSIS — A419 Sepsis, unspecified organism: Secondary | ICD-10-CM

## 2015-07-14 DIAGNOSIS — I5030 Unspecified diastolic (congestive) heart failure: Secondary | ICD-10-CM | POA: Diagnosis present

## 2015-07-14 DIAGNOSIS — R918 Other nonspecific abnormal finding of lung field: Secondary | ICD-10-CM | POA: Diagnosis present

## 2015-07-14 DIAGNOSIS — Z87891 Personal history of nicotine dependence: Secondary | ICD-10-CM

## 2015-07-14 DIAGNOSIS — A403 Sepsis due to Streptococcus pneumoniae: Secondary | ICD-10-CM | POA: Diagnosis not present

## 2015-07-14 DIAGNOSIS — M25569 Pain in unspecified knee: Secondary | ICD-10-CM | POA: Diagnosis present

## 2015-07-14 DIAGNOSIS — Z79899 Other long term (current) drug therapy: Secondary | ICD-10-CM

## 2015-07-14 DIAGNOSIS — M00261 Other streptococcal arthritis, right knee: Secondary | ICD-10-CM | POA: Diagnosis not present

## 2015-07-14 DIAGNOSIS — I10 Essential (primary) hypertension: Secondary | ICD-10-CM | POA: Diagnosis present

## 2015-07-14 DIAGNOSIS — N179 Acute kidney failure, unspecified: Secondary | ICD-10-CM | POA: Diagnosis present

## 2015-07-14 DIAGNOSIS — G4730 Sleep apnea, unspecified: Secondary | ICD-10-CM | POA: Diagnosis present

## 2015-07-14 DIAGNOSIS — I130 Hypertensive heart and chronic kidney disease with heart failure and stage 1 through stage 4 chronic kidney disease, or unspecified chronic kidney disease: Secondary | ICD-10-CM | POA: Diagnosis present

## 2015-07-14 DIAGNOSIS — R6520 Severe sepsis without septic shock: Secondary | ICD-10-CM | POA: Diagnosis not present

## 2015-07-14 DIAGNOSIS — L03113 Cellulitis of right upper limb: Secondary | ICD-10-CM | POA: Diagnosis present

## 2015-07-14 DIAGNOSIS — B182 Chronic viral hepatitis C: Secondary | ICD-10-CM | POA: Diagnosis present

## 2015-07-14 DIAGNOSIS — N170 Acute kidney failure with tubular necrosis: Secondary | ICD-10-CM | POA: Diagnosis not present

## 2015-07-14 DIAGNOSIS — F329 Major depressive disorder, single episode, unspecified: Secondary | ICD-10-CM | POA: Diagnosis present

## 2015-07-14 DIAGNOSIS — R6521 Severe sepsis with septic shock: Secondary | ICD-10-CM | POA: Diagnosis present

## 2015-07-14 DIAGNOSIS — Y831 Surgical operation with implant of artificial internal device as the cause of abnormal reaction of the patient, or of later complication, without mention of misadventure at the time of the procedure: Secondary | ICD-10-CM | POA: Diagnosis present

## 2015-07-14 DIAGNOSIS — B954 Other streptococcus as the cause of diseases classified elsewhere: Secondary | ICD-10-CM | POA: Diagnosis not present

## 2015-07-14 DIAGNOSIS — M009 Pyogenic arthritis, unspecified: Secondary | ICD-10-CM | POA: Diagnosis present

## 2015-07-14 DIAGNOSIS — N189 Chronic kidney disease, unspecified: Secondary | ICD-10-CM | POA: Diagnosis present

## 2015-07-14 DIAGNOSIS — Z96651 Presence of right artificial knee joint: Secondary | ICD-10-CM | POA: Diagnosis not present

## 2015-07-14 DIAGNOSIS — Z452 Encounter for adjustment and management of vascular access device: Secondary | ICD-10-CM

## 2015-07-14 DIAGNOSIS — Z888 Allergy status to other drugs, medicaments and biological substances status: Secondary | ICD-10-CM | POA: Diagnosis not present

## 2015-07-14 DIAGNOSIS — Z96659 Presence of unspecified artificial knee joint: Secondary | ICD-10-CM

## 2015-07-14 DIAGNOSIS — B955 Unspecified streptococcus as the cause of diseases classified elsewhere: Secondary | ICD-10-CM | POA: Diagnosis present

## 2015-07-14 DIAGNOSIS — R509 Fever, unspecified: Secondary | ICD-10-CM | POA: Diagnosis not present

## 2015-07-14 HISTORY — PX: KNEE ARTHROSCOPY: SHX127

## 2015-07-14 LAB — COMPREHENSIVE METABOLIC PANEL
ALK PHOS: 57 U/L (ref 38–126)
ALT: 19 U/L (ref 17–63)
ALT: 22 U/L (ref 17–63)
AST: 30 U/L (ref 15–41)
AST: 25 U/L (ref 15–41)
Albumin: 2.7 g/dL — ABNORMAL LOW (ref 3.5–5.0)
Albumin: 2.4 g/dL — ABNORMAL LOW (ref 3.5–5.0)
Alkaline Phosphatase: 68 U/L (ref 38–126)
Anion gap: 9 (ref 5–15)
Anion gap: 10 (ref 5–15)
BILIRUBIN TOTAL: 0.8 mg/dL (ref 0.3–1.2)
BUN: 29 mg/dL — AB (ref 6–20)
BUN: 31 mg/dL — AB (ref 6–20)
CALCIUM: 7.8 mg/dL — AB (ref 8.9–10.3)
CHLORIDE: 108 mmol/L (ref 101–111)
CO2: 18 mmol/L — ABNORMAL LOW (ref 22–32)
CO2: 18 mmol/L — AB (ref 22–32)
CREATININE: 2.51 mg/dL — AB (ref 0.61–1.24)
CREATININE: 2.51 mg/dL — AB (ref 0.61–1.24)
Calcium: 7.1 mg/dL — ABNORMAL LOW (ref 8.9–10.3)
Chloride: 109 mmol/L (ref 101–111)
GFR calc Af Amer: 30 mL/min — ABNORMAL LOW (ref >60)
GFR calc non Af Amer: 26 mL/min — ABNORMAL LOW (ref >60)
GFR, EST AFRICAN AMERICAN: 30 mL/min — AB (ref >60)
GFR, EST NON AFRICAN AMERICAN: 26 mL/min — AB (ref >60)
Glucose, Bld: 103 mg/dL — ABNORMAL HIGH (ref 65–99)
Glucose, Bld: 112 mg/dL — ABNORMAL HIGH (ref 65–99)
POTASSIUM: 5.2 mmol/L — AB (ref 3.5–5.1)
Potassium: 4.5 mmol/L (ref 3.5–5.1)
SODIUM: 136 mmol/L (ref 135–145)
Sodium: 136 mmol/L (ref 135–145)
Total Bilirubin: 1 mg/dL (ref 0.3–1.2)
Total Protein: 5.1 g/dL — ABNORMAL LOW (ref 6.5–8.1)
Total Protein: 5.2 g/dL — ABNORMAL LOW (ref 6.5–8.1)

## 2015-07-14 LAB — URINE MICROSCOPIC-ADD ON: RBC / HPF: NONE SEEN RBC/hpf (ref 0–5)

## 2015-07-14 LAB — CBC WITH DIFFERENTIAL/PLATELET
BLASTS: 0 %
Band Neutrophils: 7 %
Basophils Absolute: 0 10*3/uL (ref 0.0–0.1)
Basophils Relative: 0 %
Eosinophils Absolute: 0 10*3/uL (ref 0.0–0.7)
Eosinophils Relative: 0 %
HEMATOCRIT: 28.2 % — AB (ref 39.0–52.0)
HEMOGLOBIN: 9.3 g/dL — AB (ref 13.0–17.0)
LYMPHS ABS: 1.2 10*3/uL (ref 0.7–4.0)
Lymphocytes Relative: 3 %
MCH: 31.7 pg (ref 26.0–34.0)
MCHC: 33 g/dL (ref 30.0–36.0)
MCV: 96.2 fL (ref 78.0–100.0)
METAMYELOCYTES PCT: 0 %
MONOS PCT: 0 %
MYELOCYTES: 0 %
Monocytes Absolute: 0 10*3/uL — ABNORMAL LOW (ref 0.1–1.0)
Neutro Abs: 39.4 10*3/uL — ABNORMAL HIGH (ref 1.7–7.7)
Neutrophils Relative %: 90 %
PROMYELOCYTES ABS: 0 %
Platelets: 241 10*3/uL (ref 150–400)
RBC: 2.93 MIL/uL — AB (ref 4.22–5.81)
RDW: 14.7 % (ref 11.5–15.5)
WBC: 40.6 10*3/uL — AB (ref 4.0–10.5)
nRBC: 0 /100 WBC

## 2015-07-14 LAB — CBC
HEMATOCRIT: 28.8 % — AB (ref 39.0–52.0)
Hemoglobin: 9.1 g/dL — ABNORMAL LOW (ref 13.0–17.0)
MCH: 30.4 pg (ref 26.0–34.0)
MCHC: 31.6 g/dL (ref 30.0–36.0)
MCV: 96.3 fL (ref 78.0–100.0)
PLATELETS: 204 10*3/uL (ref 150–400)
RBC: 2.99 MIL/uL — AB (ref 4.22–5.81)
RDW: 15.3 % (ref 11.5–15.5)
WBC: 38 10*3/uL — ABNORMAL HIGH (ref 4.0–10.5)

## 2015-07-14 LAB — GRAM STAIN

## 2015-07-14 LAB — URINALYSIS, ROUTINE W REFLEX MICROSCOPIC
GLUCOSE, UA: NEGATIVE mg/dL
HGB URINE DIPSTICK: NEGATIVE
KETONES UR: 15 mg/dL — AB
Nitrite: NEGATIVE
PH: 5 (ref 5.0–8.0)
PROTEIN: NEGATIVE mg/dL
Specific Gravity, Urine: 1.018 (ref 1.005–1.030)

## 2015-07-14 LAB — C-REACTIVE PROTEIN: CRP: 6.5 mg/dL — ABNORMAL HIGH (ref <1.0)

## 2015-07-14 LAB — SEDIMENTATION RATE: Sed Rate: 20 mm/hr — ABNORMAL HIGH (ref 0–16)

## 2015-07-14 LAB — I-STAT CG4 LACTIC ACID, ED: LACTIC ACID, VENOUS: 4.02 mmol/L — AB (ref 0.5–2.0)

## 2015-07-14 LAB — GLUCOSE, CAPILLARY: GLUCOSE-CAPILLARY: 109 mg/dL — AB (ref 65–99)

## 2015-07-14 LAB — PROCALCITONIN: PROCALCITONIN: 22.2 ng/mL

## 2015-07-14 LAB — LACTIC ACID, PLASMA: Lactic Acid, Venous: 2 mmol/L (ref 0.5–2.0)

## 2015-07-14 LAB — MRSA PCR SCREENING: MRSA by PCR: NEGATIVE

## 2015-07-14 SURGERY — ARTHROSCOPY, KNEE
Anesthesia: General | Site: Knee | Laterality: Right

## 2015-07-14 MED ORDER — ALPRAZOLAM 0.25 MG PO TABS
0.2500 mg | ORAL_TABLET | Freq: Every evening | ORAL | Status: DC | PRN
  Administered 2015-07-19 – 2015-07-20 (×2): 0.25 mg via ORAL
  Filled 2015-07-14 (×3): qty 1

## 2015-07-14 MED ORDER — SUCCINYLCHOLINE CHLORIDE 20 MG/ML IJ SOLN
INTRAMUSCULAR | Status: DC | PRN
  Administered 2015-07-14: 120 mg via INTRAVENOUS

## 2015-07-14 MED ORDER — OLANZAPINE-FLUOXETINE HCL 12-50 MG PO CAPS
1.0000 | ORAL_CAPSULE | Freq: Every day | ORAL | Status: DC
  Administered 2015-07-14: 1 via ORAL
  Filled 2015-07-14 (×2): qty 1

## 2015-07-14 MED ORDER — HYDROCORTISONE NA SUCCINATE PF 100 MG IJ SOLR
INTRAMUSCULAR | Status: DC | PRN
  Administered 2015-07-14: 100 mg via INTRAVENOUS

## 2015-07-14 MED ORDER — FENTANYL CITRATE (PF) 100 MCG/2ML IJ SOLN
50.0000 ug | Freq: Once | INTRAMUSCULAR | Status: AC
  Administered 2015-07-14: 50 ug via INTRAVENOUS
  Filled 2015-07-14: qty 2

## 2015-07-14 MED ORDER — FENTANYL CITRATE (PF) 100 MCG/2ML IJ SOLN
25.0000 ug | INTRAMUSCULAR | Status: DC | PRN
  Administered 2015-07-14 (×2): 50 ug via INTRAVENOUS

## 2015-07-14 MED ORDER — DICYCLOMINE HCL 20 MG PO TABS
20.0000 mg | ORAL_TABLET | Freq: Two times a day (BID) | ORAL | Status: DC
  Administered 2015-07-14 – 2015-07-20 (×11): 20 mg via ORAL
  Filled 2015-07-14 (×13): qty 1

## 2015-07-14 MED ORDER — NOREPINEPHRINE BITARTRATE 1 MG/ML IV SOLN
0.0000 ug/min | Freq: Once | INTRAVENOUS | Status: AC
  Administered 2015-07-14: 5 ug/min via INTRAVENOUS
  Filled 2015-07-14: qty 4

## 2015-07-14 MED ORDER — MIDAZOLAM HCL 2 MG/2ML IJ SOLN
INTRAMUSCULAR | Status: AC
  Filled 2015-07-14: qty 2

## 2015-07-14 MED ORDER — VANCOMYCIN HCL 10 G IV SOLR
1250.0000 mg | INTRAVENOUS | Status: DC
  Administered 2015-07-15 – 2015-07-17 (×3): 1250 mg via INTRAVENOUS
  Filled 2015-07-14 (×5): qty 1250

## 2015-07-14 MED ORDER — ONDANSETRON HCL 4 MG PO TABS: 4.0000 mg | ORAL_TABLET | Freq: Four times a day (QID) | ORAL | Status: DC | PRN

## 2015-07-14 MED ORDER — SODIUM CHLORIDE 0.9 % IV BOLUS (SEPSIS)
1000.0000 mL | Freq: Once | INTRAVENOUS | Status: AC
  Administered 2015-07-14: 1000 mL via INTRAVENOUS

## 2015-07-14 MED ORDER — ONDANSETRON HCL 4 MG/2ML IJ SOLN
INTRAMUSCULAR | Status: AC
  Filled 2015-07-14: qty 2

## 2015-07-14 MED ORDER — ALBUMIN HUMAN 5 % IV SOLN
INTRAVENOUS | Status: DC | PRN
  Administered 2015-07-14: 16:00:00 via INTRAVENOUS

## 2015-07-14 MED ORDER — NOREPINEPHRINE BITARTRATE 1 MG/ML IV SOLN
2.0000 ug/min | INTRAVENOUS | Status: DC
  Administered 2015-07-14: 5 ug/min via INTRAVENOUS
  Filled 2015-07-14: qty 4

## 2015-07-14 MED ORDER — ONDANSETRON HCL 4 MG/2ML IJ SOLN
INTRAMUSCULAR | Status: DC | PRN
  Administered 2015-07-14: 4 mg via INTRAVENOUS

## 2015-07-14 MED ORDER — SUCRALFATE 1 G PO TABS
1.0000 g | ORAL_TABLET | Freq: Three times a day (TID) | ORAL | Status: DC
  Administered 2015-07-14 – 2015-07-21 (×26): 1 g via ORAL
  Filled 2015-07-14 (×28): qty 1

## 2015-07-14 MED ORDER — LEVOTHYROXINE SODIUM 75 MCG PO TABS
175.0000 ug | ORAL_TABLET | Freq: Every day | ORAL | Status: DC
  Administered 2015-07-15 – 2015-07-21 (×8): 175 ug via ORAL
  Filled 2015-07-14 (×8): qty 1

## 2015-07-14 MED ORDER — ONDANSETRON HCL 4 MG/2ML IJ SOLN
4.0000 mg | Freq: Once | INTRAMUSCULAR | Status: AC
  Administered 2015-07-14: 4 mg via INTRAVENOUS
  Filled 2015-07-14: qty 2

## 2015-07-14 MED ORDER — CEFEPIME HCL 2 G IJ SOLR
INTRAMUSCULAR | Status: AC
  Filled 2015-07-14: qty 2

## 2015-07-14 MED ORDER — ACETAMINOPHEN 325 MG PO TABS: 650.0000 mg | ORAL_TABLET | Freq: Four times a day (QID) | ORAL | Status: DC | PRN

## 2015-07-14 MED ORDER — OXYCODONE HCL 5 MG PO TABS: 5.0000 mg | ORAL_TABLET | Freq: Once | ORAL | Status: DC | PRN

## 2015-07-14 MED ORDER — FENTANYL CITRATE (PF) 250 MCG/5ML IJ SOLN
INTRAMUSCULAR | Status: AC
  Filled 2015-07-14: qty 5

## 2015-07-14 MED ORDER — ESCITALOPRAM OXALATE 20 MG PO TABS: 20.0000 mg | ORAL_TABLET | Freq: Every morning | ORAL | Status: DC

## 2015-07-14 MED ORDER — HYDROCORTISONE NA SUCCINATE PF 250 MG IJ SOLR
INTRAMUSCULAR | Status: AC
  Filled 2015-07-14: qty 250

## 2015-07-14 MED ORDER — PROPOFOL 10 MG/ML IV BOLUS
INTRAVENOUS | Status: AC
  Filled 2015-07-14: qty 20

## 2015-07-14 MED ORDER — PROPOFOL 10 MG/ML IV BOLUS
INTRAVENOUS | Status: DC | PRN
  Administered 2015-07-14: 150 mg via INTRAVENOUS

## 2015-07-14 MED ORDER — LIDOCAINE HCL (CARDIAC) 20 MG/ML IV SOLN
INTRAVENOUS | Status: DC | PRN
  Administered 2015-07-14: 50 mg via INTRAVENOUS

## 2015-07-14 MED ORDER — VITAMIN D 1000 UNITS PO TABS
3000.0000 [IU] | ORAL_TABLET | Freq: Every day | ORAL | Status: DC
  Administered 2015-07-15 – 2015-07-21 (×7): 3000 [IU] via ORAL
  Filled 2015-07-14 (×8): qty 3

## 2015-07-14 MED ORDER — MIDAZOLAM HCL 5 MG/5ML IJ SOLN
INTRAMUSCULAR | Status: DC | PRN
  Administered 2015-07-14 (×2): 1 mg via INTRAVENOUS

## 2015-07-14 MED ORDER — BUPIVACAINE HCL (PF) 0.25 % IJ SOLN
INTRAMUSCULAR | Status: AC
  Filled 2015-07-14: qty 30

## 2015-07-14 MED ORDER — ONDANSETRON HCL 4 MG/2ML IJ SOLN: 4.0000 mg | Freq: Once | INTRAMUSCULAR | Status: DC | PRN

## 2015-07-14 MED ORDER — METOCLOPRAMIDE HCL 5 MG/ML IJ SOLN
5.0000 mg | Freq: Three times a day (TID) | INTRAMUSCULAR | Status: DC | PRN
  Filled 2015-07-14: qty 2

## 2015-07-14 MED ORDER — ACETAMINOPHEN 650 MG RE SUPP: 650.0000 mg | Freq: Four times a day (QID) | RECTAL | Status: DC | PRN

## 2015-07-14 MED ORDER — OXYCODONE-ACETAMINOPHEN 5-325 MG PO TABS
2.0000 | ORAL_TABLET | Freq: Once | ORAL | Status: AC
  Administered 2015-07-14: 2 via ORAL
  Filled 2015-07-14: qty 2

## 2015-07-14 MED ORDER — FENTANYL CITRATE (PF) 100 MCG/2ML IJ SOLN
INTRAMUSCULAR | Status: AC
  Administered 2015-07-14: 50 ug via INTRAVENOUS
  Filled 2015-07-14: qty 2

## 2015-07-14 MED ORDER — LIDOCAINE HCL (PF) 1 % IJ SOLN
INTRAMUSCULAR | Status: AC
  Filled 2015-07-14: qty 5

## 2015-07-14 MED ORDER — METOCLOPRAMIDE HCL 10 MG PO TABS: 5.0000 mg | ORAL_TABLET | Freq: Three times a day (TID) | ORAL | Status: DC | PRN

## 2015-07-14 MED ORDER — FOLIC ACID 1 MG PO TABS
1.0000 mg | ORAL_TABLET | Freq: Every day | ORAL | Status: DC
Start: 2015-07-14 — End: 2015-07-14

## 2015-07-14 MED ORDER — HYDROMORPHONE HCL 1 MG/ML IJ SOLN
1.0000 mg | Freq: Once | INTRAMUSCULAR | Status: AC
  Administered 2015-07-14: 1 mg via INTRAVENOUS
  Filled 2015-07-14: qty 1

## 2015-07-14 MED ORDER — ONDANSETRON HCL 4 MG/2ML IJ SOLN: 4.0000 mg | Freq: Four times a day (QID) | INTRAMUSCULAR | Status: DC | PRN

## 2015-07-14 MED ORDER — SODIUM CHLORIDE 0.9 % IR SOLN
Status: DC | PRN
  Administered 2015-07-14 (×2): 3000 mL

## 2015-07-14 MED ORDER — VITAMIN D3 25 MCG (1000 UNIT) PO TABS: 3000.0000 [IU] | ORAL_TABLET | Freq: Every day | ORAL | Status: DC

## 2015-07-14 MED ORDER — OXYCODONE-ACETAMINOPHEN 5-325 MG PO TABS
1.0000 | ORAL_TABLET | ORAL | Status: DC | PRN
  Administered 2015-07-14: 1 via ORAL
  Administered 2015-07-15 – 2015-07-16 (×4): 2 via ORAL
  Filled 2015-07-14 (×3): qty 2
  Filled 2015-07-14: qty 1
  Filled 2015-07-14: qty 2

## 2015-07-14 MED ORDER — HYDROMORPHONE HCL 1 MG/ML IJ SOLN
1.0000 mg | INTRAMUSCULAR | Status: DC | PRN
  Administered 2015-07-14 – 2015-07-16 (×6): 1 mg via INTRAVENOUS
  Filled 2015-07-14 (×6): qty 1

## 2015-07-14 MED ORDER — VANCOMYCIN HCL IN DEXTROSE 1-5 GM/200ML-% IV SOLN
1000.0000 mg | Freq: Once | INTRAVENOUS | Status: AC
Start: 2015-07-14 — End: 2015-07-14
  Administered 2015-07-14: 1000 mg via INTRAVENOUS
  Filled 2015-07-14: qty 200

## 2015-07-14 MED ORDER — FENTANYL CITRATE (PF) 100 MCG/2ML IJ SOLN
INTRAMUSCULAR | Status: DC | PRN
Start: 2015-07-14 — End: 2015-07-14
  Administered 2015-07-14: 50 ug via INTRAVENOUS
  Administered 2015-07-14: 100 ug via INTRAVENOUS
  Administered 2015-07-14: 150 ug via INTRAVENOUS
  Administered 2015-07-14 (×2): 100 ug via INTRAVENOUS

## 2015-07-14 MED ORDER — 0.9 % SODIUM CHLORIDE (POUR BTL) OPTIME
TOPICAL | Status: DC | PRN
  Administered 2015-07-14: 1000 mL

## 2015-07-14 MED ORDER — DEXTROSE 5 % IV SOLN
2.0000 g | Freq: Once | INTRAVENOUS | Status: AC
  Administered 2015-07-14: 2 g via INTRAVENOUS

## 2015-07-14 MED ORDER — DEXTROSE 5 % IV SOLN
1.0000 g | INTRAVENOUS | Status: DC
  Administered 2015-07-15 – 2015-07-17 (×3): 1 g via INTRAVENOUS
  Filled 2015-07-14 (×4): qty 1

## 2015-07-14 MED ORDER — OXYCODONE HCL 5 MG/5ML PO SOLN: 5.0000 mg | Freq: Once | ORAL | Status: DC | PRN

## 2015-07-14 MED ORDER — SODIUM CHLORIDE 0.9 % IV SOLN
INTRAVENOUS | Status: DC
  Administered 2015-07-14 – 2015-07-17 (×9): via INTRAVENOUS

## 2015-07-14 MED ORDER — FENTANYL CITRATE (PF) 100 MCG/2ML IJ SOLN
100.0000 ug | Freq: Once | INTRAMUSCULAR | Status: AC
  Administered 2015-07-14: 100 ug via INTRAVENOUS
  Filled 2015-07-14: qty 2

## 2015-07-14 MED ORDER — DICYCLOMINE HCL 20 MG PO TABS: 20.0000 mg | ORAL_TABLET | Freq: Two times a day (BID) | ORAL | Status: DC

## 2015-07-14 MED ORDER — ONDANSETRON HCL 4 MG PO TABS: 4.0000 mg | ORAL_TABLET | Freq: Three times a day (TID) | ORAL | Status: DC | PRN

## 2015-07-14 SURGICAL SUPPLY — 40 items
BANDAGE ACE 6X5 VEL STRL LF (GAUZE/BANDAGES/DRESSINGS) ×3 IMPLANT
BANDAGE ELASTIC 6 VELCRO ST LF (GAUZE/BANDAGES/DRESSINGS) IMPLANT
BLADE CUDA 5.5 (BLADE) IMPLANT
BLADE CUTTER GATOR 3.5 (BLADE) IMPLANT
BLADE GREAT WHITE 4.2 (BLADE) ×2 IMPLANT
BLADE GREAT WHITE 4.2MM (BLADE) ×1
BOOTCOVER CLEANROOM LRG (PROTECTIVE WEAR) ×12 IMPLANT
CLOSURE WOUND 1/2 X4 (GAUZE/BANDAGES/DRESSINGS)
DRAIN SNY WOU MED RD (WOUND CARE) ×3 IMPLANT
DRAPE ARTHROSCOPY W/POUCH 114 (DRAPES) ×3 IMPLANT
DRSG PAD ABDOMINAL 8X10 ST (GAUZE/BANDAGES/DRESSINGS) ×3 IMPLANT
DURAPREP 26ML APPLICATOR (WOUND CARE) ×3 IMPLANT
GAUZE SPONGE 4X4 12PLY STRL (GAUZE/BANDAGES/DRESSINGS) ×3 IMPLANT
GLOVE BIO SURGEON STRL SZ7.5 (GLOVE) ×3 IMPLANT
GLOVE BIO SURGEON STRL SZ8 (GLOVE) ×3 IMPLANT
GLOVE EUDERMIC 7 POWDERFREE (GLOVE) ×3 IMPLANT
GLOVE SS BIOGEL STRL SZ 7.5 (GLOVE) ×1 IMPLANT
GLOVE SUPERSENSE BIOGEL SZ 7.5 (GLOVE) ×2
GOWN STRL REUS W/ TWL LRG LVL3 (GOWN DISPOSABLE) ×1 IMPLANT
GOWN STRL REUS W/ TWL XL LVL3 (GOWN DISPOSABLE) ×2 IMPLANT
GOWN STRL REUS W/TWL LRG LVL3 (GOWN DISPOSABLE) ×2
GOWN STRL REUS W/TWL XL LVL3 (GOWN DISPOSABLE) ×4
KIT BASIN OR (CUSTOM PROCEDURE TRAY) ×3 IMPLANT
KIT ROOM TURNOVER OR (KITS) ×3 IMPLANT
MANIFOLD NEPTUNE II (INSTRUMENTS) ×3 IMPLANT
NEEDLE 18GX1X1/2 (RX/OR ONLY) (NEEDLE) IMPLANT
PACK ARTHROSCOPY DSU (CUSTOM PROCEDURE TRAY) ×3 IMPLANT
PAD ARMBOARD 7.5X6 YLW CONV (MISCELLANEOUS) ×6 IMPLANT
PADDING CAST ABS 6INX4YD NS (CAST SUPPLIES) ×2
PADDING CAST ABS COTTON 6X4 NS (CAST SUPPLIES) ×1 IMPLANT
PADDING CAST COTTON 6X4 STRL (CAST SUPPLIES) IMPLANT
RING FOAM WHITE (MISCELLANEOUS) ×3 IMPLANT
SET ARTHROSCOPY TUBING (MISCELLANEOUS) ×2
SET ARTHROSCOPY TUBING LN (MISCELLANEOUS) ×1 IMPLANT
SPONGE LAP 4X18 X RAY DECT (DISPOSABLE) IMPLANT
STRIP CLOSURE SKIN 1/2X4 (GAUZE/BANDAGES/DRESSINGS) IMPLANT
SUT ETHILON 2 0 FS 18 (SUTURE) ×3 IMPLANT
SYR 30ML LL (SYRINGE) IMPLANT
TOWEL OR 17X24 6PK STRL BLUE (TOWEL DISPOSABLE) ×3 IMPLANT
WATER STERILE IRR 1000ML POUR (IV SOLUTION) ×3 IMPLANT

## 2015-07-14 NOTE — Progress Notes (Addendum)
Pharmacy Antibiotic Note  Juan Wiggins is a 65 y.o. male admitted on 07/14/2015 with pneumonia.  Pharmacy has been consulted for Vancomycin dosing.  Pt presented with acute onset knee pain after feeling it pop out of place, swelling, and significant pain.  Pt is now hypotensive, to be started on Levophed drip, and has elevated LA  He is to start Vancomcyin for pneumonia.  He also has an order for Cefepime 2g IV x 1, currently running. Cr 2.51 and WBC 40.6, both just resulted.  CrCl 30-35 ml/min  Pt is at [facility] and they have Vancomycin 1000mg  in their automated dispensing cabinet.   Plan: Vancomycin 1000mg  IV x1, then continue 1250mg  IV q24- next dose at 6AM on 5/22. F/U continuation of Cefepime I have spoken with ChristyRN to let her know order has been entered F/U cultures, watch renal fxn, and trend clinical status  Addendum: Pharmacy consulted to continue cefepime. Received 2g at [phone]. Will dose cefepime 1g IV q24 starting tomorrow 5/22.  Nena Jordan, PharmD, BCPS 07/14/2015, 1:17 PM  Height: 5\' 11"  (180.3 cm) Weight: 200 lb (90.719 kg) IBW/kg (Calculated) : 75.3  Temp (24hrs), Avg:99.6 F (37.6 C), Min:98.1 F (36.7 C), Max:100.6 F (38.1 C)   Recent Labs Lab 07/14/15 0840  LATICACIDVEN 4.02*    CrCl cannot be calculated (Patient has no serum creatinine result on file.).    No Known Allergies  Antimicrobials this admission: Vanc 5/21 >> Cefepime x 1 5/21  Microbiology results: 5/21 blood >>  5/21 urine >>   Thank you for allowing pharmacy to be a part of this patient's care.   Gracy Bruins, PharmD Clinical Pharmacist [facility]

## 2015-07-14 NOTE — Consult Note (Signed)
Reason for Consult:right knee pain Referring Physician: Halford Chessman  HPI: MACALLAN ORD is an 65 y.o. male former smoker with polysubstance abuse(etoh, cocaine, THC, benzo) , Hep C , DJD w/ prev Knee replacements(Dr. Gioffre) presented to ER early am via EMS at [facility] with sudden onset of Right knee pain and swelling. States he simply stepped out of bed this AM with abrupt onset right knee pain and subsequent swelling. Recent admission to [place] center for sepsis and renal failure, reportedly had right wrist "abscess" drained and treated with abx. Denies issues with knee pain prior to this am.   Past Medical History  Diagnosis Date  . Hypertension   . Thyroid disease   . Hemorrhoids   . Hypothyroidism   . Tachycardia     PT STATES HIS HEART RATE USUALLY 100 OR MORE  . Depression   . Anxiety   . GERD (gastroesophageal reflux disease)     PREVACID IF NEEDED  . Arthritis     RHEUMATOID ARTHRITIS; OA LEFT KNEE  . 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 Sleep apnea     CLAUSTROPHOBIC - COULD NOT TOLERATE CPAP MASK  . Heart murmur   . Pancreatitis, alcoholic, acute   . Cancer ([place])     MELANOMA REMOVED RT SHOULDER    Past Surgical History  Procedure Laterality Date  . Knee surgery      BILATERAL KNEE ARTHROSCOPY  . Shoulder surgery      RIGHT ROTATOR CUFF REPAIR AND LEFT ARTHROSCOPY  . Elbow surgery      BILATERAL ELBOW SURGERY  . Refractive surgery    . Total knee arthroplasty Left 08/23/2013    Procedure: LEFT TOTAL KNEE ARTHROPLASTY;  Surgeon: Tobi Bastos, MD;  Location: [facility];  Service: Orthopedics;  Laterality: Left;  . Colonscopy     . Left tkr      July 2015  . Total knee arthroplasty Right 02/07/2014    Procedure: RIGHT TOTAL KNEE ARTHROPLASTY;  Surgeon: Tobi Bastos, MD;  Location: [facility];  Service: Orthopedics;  Laterality: Right;   . Lumbar laminectomy/decompression microdiscectomy Left 06/06/2014    Procedure: LEFT L4-5 DECOMPRESSION ;  Surgeon: Melina Schools, MD;  Location: [place];  Service: Orthopedics;  Laterality: Left;    Family History  Problem Relation Age of Onset  . Hypertension Mother   . Other Mother     varicose veins  . Hypertension Sister     Social History:  reports that he quit smoking about 17 years ago. His smoking use included Cigars. He has never used smokeless tobacco. He reports that he does not drink alcohol or use illicit drugs.  Allergies:  Allergies  Allergen Reactions  . Heparin Other (See Comments)    Low platelets.     Medications: I have reviewed the patient's current medications.  Results for orders placed or performed during the hospital encounter of 07/14/15 (from the past 48 hour(s))  CBC with Differential/Platelet     Status: Abnormal   Collection Time: 07/14/15  8:30 AM  Result Value Ref Range   WBC 40.6 (H) 4.0 - 10.5 K/uL    Comment: REPEATED TO VERIFY   RBC 2.93 (L) 4.22 - 5.81 MIL/uL   Hemoglobin 9.3 (L) 13.0 - 17.0 g/dL   HCT 28.2 (L) 39.0 -  52.0 %   MCV 96.2 78.0 - 100.0 fL   MCH 31.7 26.0 - 34.0 pg   MCHC 33.0 30.0 - 36.0 g/dL   RDW 14.7 11.5 - 15.5 %   Platelets 241 150 - 400 K/uL   Neutrophils Relative % 90 %   Lymphocytes Relative 3 %   Monocytes Relative 0 %   Eosinophils Relative 0 %   Basophils Relative 0 %   Band Neutrophils 7 %   Metamyelocytes Relative 0 %   Myelocytes 0 %   Promyelocytes Absolute 0 %   Blasts 0 %   nRBC 0 0 /100 WBC   Neutro Abs 39.4 (H) 1.7 - 7.7 K/uL   Lymphs Abs 1.2 0.7 - 4.0 K/uL   Monocytes Absolute 0.0 (L) 0.1 - 1.0 K/uL   Eosinophils Absolute 0.0 0.0 - 0.7 K/uL   Basophils Absolute 0.0 0.0 - 0.1 K/uL   WBC Morphology MILD LEFT SHIFT (1-5% METAS, OCC MYELO, OCC BANDS)   Comprehensive metabolic panel     Status: Abnormal   Collection Time: 07/14/15  8:30 AM  Result Value Ref Range   Sodium 136 135 - 145 mmol/L    Potassium 4.5 3.5 - 5.1 mmol/L   Chloride 109 101 - 111 mmol/L   CO2 18 (L) 22 - 32 mmol/L   Glucose, Bld 103 (H) 65 - 99 mg/dL   BUN 29 (H) 6 - 20 mg/dL   Creatinine, Ser 2.51 (H) 0.61 - 1.24 mg/dL   Calcium 7.8 (L) 8.9 - 10.3 mg/dL   Total Protein 5.1 (L) 6.5 - 8.1 g/dL   Albumin 2.7 (L) 3.5 - 5.0 g/dL   AST 30 15 - 41 U/L   ALT 19 17 - 63 U/L   Alkaline Phosphatase 57 38 - 126 U/L   Total Bilirubin 0.8 0.3 - 1.2 mg/dL   GFR calc non Af Amer 26 (L) >60 mL/min   GFR calc Af Amer 30 (L) >60 mL/min    Comment: (NOTE) The eGFR has been calculated using the CKD EPI equation. This calculation has not been validated in all clinical situations. eGFR's persistently <60 mL/min signify possible Chronic Kidney Disease.    Anion gap 9 5 - 15  Sedimentation rate     Status: Abnormal   Collection Time: 07/14/15  8:30 AM  Result Value Ref Range   Sed Rate 20 (H) 0 - 16 mm/hr  C-reactive protein     Status: Abnormal   Collection Time: 07/14/15  8:30 AM  Result Value Ref Range   CRP 6.5 (H) <1.0 mg/dL    Comment: Performed at [facility] Lactic Acid, ED     Status: Abnormal   Collection Time: 07/14/15  8:40 AM  Result Value Ref Range   Lactic Acid, Venous 4.02 (HH) 0.5 - 2.0 mmol/L   Comment NOTIFIED PHYSICIAN   Urinalysis, Routine w reflex microscopic (not at [facility])     Status: Abnormal   Collection Time: 07/14/15  9:40 AM  Result Value Ref Range   Color, Urine AMBER (A) YELLOW    Comment: BIOCHEMICALS MAY BE AFFECTED BY COLOR   APPearance CLOUDY (A) CLEAR   Specific Gravity, Urine 1.018 1.005 - 1.030   pH 5.0 5.0 - 8.0   Glucose, UA NEGATIVE NEGATIVE mg/dL   Hgb urine dipstick NEGATIVE NEGATIVE   Bilirubin Urine SMALL (A) NEGATIVE   Ketones, ur 15 (A) NEGATIVE mg/dL   Protein, ur NEGATIVE NEGATIVE mg/dL   Nitrite NEGATIVE  NEGATIVE   Leukocytes, UA SMALL (A) NEGATIVE  Urine microscopic-add on     Status: Abnormal   Collection Time: 07/14/15  9:40 AM  Result  Value Ref Range   Squamous Epithelial / LPF 0-5 (A) NONE SEEN   WBC, UA 6-30 0 - 5 WBC/hpf   RBC / HPF NONE SEEN 0 - 5 RBC/hpf   Bacteria, UA MANY (A) NONE SEEN   Casts HYALINE CASTS (A) NEGATIVE   Urine-Other MUCOUS PRESENT   Glucose, capillary     Status: Abnormal   Collection Time: 07/14/15 12:48 PM  Result Value Ref Range   Glucose-Capillary 109 (H) 65 - 99 mg/dL  MRSA PCR Screening     Status: None   Collection Time: 07/14/15 12:57 PM  Result Value Ref Range   MRSA by PCR NEGATIVE NEGATIVE    Comment:        The GeneXpert MRSA Assay (FDA approved for NASAL specimens only), is one component of a comprehensive MRSA colonization surveillance program. It is not intended to diagnose MRSA infection nor to guide or monitor treatment for MRSA infections.     Dg Chest Port 1 View  07/14/2015  CLINICAL DATA:  Fever.  Code sepsis. EXAM: PORTABLE CHEST 1 VIEW COMPARISON:  10/22/2014 chest radiograph. FINDINGS: Stable cardiomediastinal silhouette with normal heart size. No pneumothorax. No pleural effusion. There are new patchy hazy opacities throughout the parahilar lungs bilaterally. IMPRESSION: New patchy hazy opacities throughout the bilateral parahilar lungs, nonspecific, consider atypical infection. Electronically Signed   By: Ilona Sorrel M.D.   On: 07/14/2015 10:08   Dg Knee Complete 4 Views Right  07/14/2015  CLINICAL DATA:  Felt right knee pop out of place while stepping down, with right knee pain. Initial encounter. EXAM: RIGHT KNEE - COMPLETE 4+ VIEW COMPARISON:  None. FINDINGS: There is no evidence of fracture or dislocation. The patient's total knee arthroplasty is grossly unremarkable in appearance. The joint spaces are preserved. No significant degenerative change is seen; the patellofemoral joint is grossly unremarkable in appearance. No significant joint effusion is seen. The visualized soft tissues are normal in appearance. IMPRESSION: 1. No evidence of fracture or  dislocation. 2. Total knee arthroplasty is grossly unremarkable in appearance. Electronically Signed   By: Garald Balding M.D.   On: 07/14/2015 03:19   Dg Hip Unilat With Pelvis 2-3 Views Right  07/14/2015  CLINICAL DATA:  Stepped down and felt right knee pop out of place. Right hip pain. Initial encounter. EXAM: DG HIP (WITH OR WITHOUT PELVIS) 2-3V RIGHT COMPARISON:  None. FINDINGS: There is no evidence of fracture or dislocation. Both femoral heads are seated normally within their respective acetabula. The proximal right femur appears intact. No significant degenerative change is appreciated. The sacroiliac joints are unremarkable in appearance. The visualized bowel gas pattern is grossly unremarkable in appearance. IMPRESSION: No evidence of fracture or dislocation. Electronically Signed   By: Garald Balding M.D.   On: 07/14/2015 03:20     Vitals Temp:  [98.1 F (36.7 C)-100.6 F (38.1 C)] 98.8 F (37.1 C) (05/21 1245) Pulse Rate:  [77-95] 86 (05/21 1300) Resp:  [11-24] 16 (05/21 1300) BP: (76-136)/(39-72) 116/72 mmHg (05/21 1300) SpO2:  [95 %-100 %] 97 % (05/21 1300) Weight:  [90.719 kg (200 lb)] 90.719 kg (200 lb) (05/21 0207) Body mass index is 27.91 kg/(m^2).  Physical Exam: heavy set WM in severe pain, diaphoretic. Denies any pain in upper extremity joints, moves UE's well. LLE no local swelling, erythema, tenderness, N/V  intact. RLE with severe tenderness and diffuse swelling about knee, minimal erythema, marked effusion.  Aspiration with sterile technique yielded > 40cc purulent material     Assessment/Plan: Impression: Septic right knee Treatment: plan for arthroscopic lavage. To OR.   M  07/14/2015, 2:19 PM  Contact # [phone]

## 2015-07-14 NOTE — H&P (Signed)
PULMONARY / CRITICAL CARE MEDICINE   Name: Juan Wiggins MRN: [id number] DOB: March 28, 1950    ADMISSION DATE:  07/14/2015 CONSULTATION DATE: 07/14/15   REFERRING MD:  [facility]   CHIEF COMPLAINT:  Sepsis   HISTORY OF PRESENT ILLNESS:   65 yo male former smoker with polysubstance abuse(etoh, cocaine, THC, benzo)  , Hep C , DJD w/ prev Knee and Hip replacements  presented to ER early am via EMS at [facility] with sudden onset of Right knee pain and swelling . Was given pain meds in route to ER . On arrival b/p low . He was given fluid challenge but required pressors be started. WBC very high at 40K , scr up at 2.5, LA at 4.0 . CXR with patchy bilateral hilar opacities. Pt w/ low grade fever. He was placed on Sepsis protcolol , started on Cefepim and Vanc . And transferred to [facility] ICU for admission by PCCM .  Recent admission for cellulitis , sepsis, renal failure . Improved with diuresis, abx . Marland Kitchen   PAST MEDICAL HISTORY :  He  has a past medical history of Hypertension; Thyroid disease; Hemorrhoids; Hypothyroidism; Tachycardia; Depression; Anxiety; GERD (gastroesophageal reflux disease); Arthritis; Hepatitis A; Lower back pain; Sleep apnea; Heart murmur; Pancreatitis, alcoholic, acute; and Cancer ([place]).  PAST SURGICAL HISTORY: He  has past surgical history that includes Knee surgery; Shoulder surgery; Elbow surgery; Refractive surgery; Total knee arthroplasty (Left, 08/23/2013); colonscopy ; left TKR; Total knee arthroplasty (Right, 02/07/2014); and Lumbar laminectomy/decompression microdiscectomy (Left, 06/06/2014).  No Known Allergies  No current facility-administered medications on file prior to encounter.   Current Outpatient Prescriptions on File Prior to Encounter  Medication Sig  . ALPRAZolam (XANAX) 0.25 MG tablet Take 0.25 mg by mouth at bedtime as needed. anxiety  . cholecalciferol (VITAMIN D) 1000 UNITS tablet Take 3,000 Units by mouth daily.  Marland Kitchen dicyclomine (BENTYL)  20 MG tablet Take 1 tablet (20 mg total) by mouth 2 (two) times daily.  Marland Kitchen escitalopram (LEXAPRO) 20 MG tablet Take 20 mg by mouth every morning.  . folic acid (FOLVITE) 1 MG tablet Take 1 tablet (1 mg total) by mouth daily. (Patient not taking: Reported on 06/04/2014)  . levothyroxine (SYNTHROID, LEVOTHROID) 175 MCG tablet Take 175 mcg by mouth daily before breakfast.  . lisinopril (PRINIVIL,ZESTRIL) 20 MG tablet Take 20 mg by mouth daily with breakfast.  . LORazepam (ATIVAN) 1 MG tablet Take 1 tablet (1 mg total) by mouth every 6 (six) hours as needed (CIWA-AR > 8  -OR-  withdrawal symptoms:  anxiety, agitation, insomnia, diaphoresis, nausea, vomiting, tremors, tachycardia, or hypertension.). (Patient not taking: Reported on 10/22/2014)  . OLANZapine-FLUoxetine (SYMBYAX) 12-50 MG per capsule Take 1 capsule by mouth at bedtime.  . ondansetron (ZOFRAN) 4 MG tablet Take 1 tablet (4 mg total) by mouth every 8 (eight) hours as needed for nausea or vomiting.  Marland Kitchen OVER THE COUNTER MEDICATION Place into both eyes daily as needed (contacts.). Flushes eyes  . promethazine (PHENERGAN) 12.5 MG tablet Take 12.5 mg by mouth every 6 (six) hours as needed for nausea or vomiting.  . sucralfate (CARAFATE) 1 G tablet Take 1 tablet (1 g total) by mouth 4 (four) times daily -  with meals and at bedtime.    FAMILY HISTORY:  His indicated that his mother is deceased. He indicated that his father is alive. He indicated that his sister is alive.   SOCIAL HISTORY: He  reports that he quit smoking about  17 years ago. His smoking use included Cigars. He has never used smokeless tobacco. He reports that he does not drink alcohol or use illicit drugs.  REVIEW OF SYSTEMS:   Constitutional:   No  weight loss, night sweats,  Fevers, chills, fatigue, or  lassitude.  HEENT:   No headaches,  Difficulty swallowing,  Tooth/dental problems, or  Sore throat,                No sneezing, itching, ear ache, nasal congestion, post nasal  drip,   CV:  No chest pain,  Orthopnea, PND, swelling in lower extremities, anasarca, dizziness, palpitations, syncope.   GI  No heartburn, indigestion, abdominal pain, nausea, vomiting, diarrhea, change in bowel habits, loss of appetite, bloody stools.   Resp: No shortness of breath with exertion or at rest.  No excess mucus, no productive cough,  No non-productive cough,  No coughing up of blood.  No change in color of mucus.  No wheezing.  No chest wall deformity  Skin: no rash or lesions.  GU: no dysuria, change in color of urine, no urgency or frequency.  No flank pain, no hematuria   MS:  No joint pain or swelling.  No decreased range of motion.  No back pain.  Psych:  No change in mood or affect. No depression or anxiety.  No memory loss.       SUBJECTIVE:    VITAL SIGNS: BP 127/58 mmHg  Pulse 86  Temp(Src) 100.6 F (38.1 C) (Rectal)  Resp 18  Ht 5\' 11"  (1.803 m)  Wt 90.719 kg (200 lb)  BMI 27.91 kg/m2  SpO2 96%  INTAKE / OUTPUT:    PHYSICAL EXAMINATION: General: alert Neuro: normal strength, follows commands, CN intact HEENT: pupils reactive, no sinus tenderness, no oral lesions, no LAN Cardiac: regular, no murmur Chest: no wheeze/rales Abd: soft, non tender, + bowel sounds Ext: Rt knee tender, swollen, warm to touch, no discoloration Skin: no rashes  LABS:  BMET  Recent Labs Lab 07/14/15 0830  NA 136  K 4.5  CL 109  CO2 18*  BUN 29*  CREATININE 2.51*  GLUCOSE 103*    Electrolytes  Recent Labs Lab 07/14/15 0830  CALCIUM 7.8*    CBC  Recent Labs Lab 07/14/15 0830  WBC 40.6*  HGB 9.3*  HCT 28.2*  PLT 241    Coag's No results for input(s): APTT, INR in the last 168 hours.  Sepsis Markers  Recent Labs Lab 07/14/15 0840  LATICACIDVEN 4.02*    ABG No results for input(s): PHART, PCO2ART, PO2ART in the last 168 hours.  Liver Enzymes  Recent Labs Lab 07/14/15 0830  AST 30  ALT 19  ALKPHOS 57  BILITOT 0.8   ALBUMIN 2.7*    Cardiac Enzymes No results for input(s): TROPONINI, PROBNP in the last 168 hours.  Glucose No results for input(s): GLUCAP in the last 168 hours.  Imaging Dg Chest Port 1 View  07/14/2015  CLINICAL DATA:  Fever.  Code sepsis. EXAM: PORTABLE CHEST 1 VIEW COMPARISON:  10/22/2014 chest radiograph. FINDINGS: Stable cardiomediastinal silhouette with normal heart size. No pneumothorax. No pleural effusion. There are new patchy hazy opacities throughout the parahilar lungs bilaterally. IMPRESSION: New patchy hazy opacities throughout the bilateral parahilar lungs, nonspecific, consider atypical infection. Electronically Signed   By: Ilona Sorrel M.D.   On: 07/14/2015 10:08   Dg Knee Complete 4 Views Right  07/14/2015  CLINICAL DATA:  Felt right knee pop out of place while  stepping down, with right knee pain. Initial encounter. EXAM: RIGHT KNEE - COMPLETE 4+ VIEW COMPARISON:  None. FINDINGS: There is no evidence of fracture or dislocation. The patient's total knee arthroplasty is grossly unremarkable in appearance. The joint spaces are preserved. No significant degenerative change is seen; the patellofemoral joint is grossly unremarkable in appearance. No significant joint effusion is seen. The visualized soft tissues are normal in appearance. IMPRESSION: 1. No evidence of fracture or dislocation. 2. Total knee arthroplasty is grossly unremarkable in appearance. Electronically Signed   By: Garald Balding M.D.   On: 07/14/2015 03:19   Dg Hip Unilat With Pelvis 2-3 Views Right  07/14/2015  CLINICAL DATA:  Stepped down and felt right knee pop out of place. Right hip pain. Initial encounter. EXAM: DG HIP (WITH OR WITHOUT PELVIS) 2-3V RIGHT COMPARISON:  None. FINDINGS: There is no evidence of fracture or dislocation. Both femoral heads are seated normally within their respective acetabula. The proximal right femur appears intact. No significant degenerative change is appreciated. The sacroiliac  joints are unremarkable in appearance. The visualized bowel gas pattern is grossly unremarkable in appearance. IMPRESSION: No evidence of fracture or dislocation. Electronically Signed   By: Garald Balding M.D.   On: 07/14/2015 03:20     STUDIES:  HIP xray 5.21 >neg  R Knee 5/21 >>neg, prev arthroplasty   CULTURES: 5/21 BC >> 5/21 UC >>  ANTIBIOTICS: 5/21` Vanc>> 5/21 Cefepime   SIGNIFICANT EVENTS: Transfer from [place] >to [place] ICU sepsis   LINES/TUBES:   DISCUSSION: 65 yo male polysubstance abuse admitted with sepsis possible right knee joint source.   ASSESSMENT / PLAN:  PULMONARY A: Bilateral perihilar opacities ? Edema vs infection  Possible HCAP  P:   ABX per ID sxn  O2 to keep sats >90%  CARDIOVASCULAR A:  Septic Shock  P:  Wean Pressors for MAP goal >65  Hold home b/p rx  Tr lactate   RENAL A:   Acute on Chronic Kidney Failure -recent HD during critical illness  Recent scr 1.8  P:   Tr bmet  Replace electrolytes as indicated IVF at 100cc   GASTROINTESTINAL A:   Hep C  P:   Tr LFT   HEMATOLOGIC A:   Anemia   P:  Tr hbg   INFECTIOUS A:   Sepsis w/ possible right knee sourse ?HCAP  Recent arm cellulitis w/ group A strep  P:   Cont IV abx w/ Maxipime/Vanc  Check PCT    ENDOCRINE A:   Hypothyroid   P:   Cont Synthroid  Monitor   NEUROLOGIC A:   Depression  Pain  ETOH /polysubstance  P:   Cont home meds  Monitor for w/d.    FAMILY  - Updates:   - Inter-disciplinary family meet or Palliative Care meeting due by:  Day 7     Pulmonary and [place] Pager: [phone]  07/14/2015, 12:33 PM  Reviewed above, examined.  65 yo male was in hospital at in [place] during April 2017 for Rt wrist cellulitis after tick bite.  He had Group A strep.  This was complicated by pneumonia and AKI requiring temporary dialysis.  He had dialysis catheter removed.  He was doing well until last night.   He developed fever 101F and severe Rt knee pain.  He had knee replacement by Dr. Gladstone Lighter several years ago.  He had Staph aureus cellulitis of his buttock in September 2016.  He has hx of ETOH,  cocaine abuse >> he reports he quit, but UDS from April 2017 was positive for cocaine.  He presented to ER.  He was hypotensive with elevated WBC and lactic acid.  He was given fluid but still hypotensive >> started on pressors.  He denies HA, sore throat, skin rash, chest pain, dyspnea, abdominal pain, N/V.  Alert.  Normal strength.  HR regular.  Chest clear.  Abd soft.  Rt knee tender, swollen, warm.  WBC 40.6, Lactic acid 4.02, Creatinine 2.51, HCO3 18, Hb 9.3.  CXR with coarse interstitial infiltrates.     XRay Rt knee unremarkable.  Assessment/plan:  Septic shock >> most likely source is Rt knee infection - continue Abx - wean off pressors >> defer CVL for now - continue IV fluid - f/u lactic acid, procalcitonin  AKI. - monitor renal fx, urine outpt  Polysubstance abuse. - check UDS  CC time by me independent of APP time 32 minutes.  Chesley Mires, MD [facility] Pulmonary/Critical Care 07/14/2015, 1:12 PM Pager:  [phone] After 3pm call: [phone]

## 2015-07-14 NOTE — ED Provider Notes (Signed)
CSN: [id number]     Arrival date & time 07/14/15  0205 History   First MD Initiated Contact with Patient 07/14/15 [phone]     Chief Complaint  Patient presents with  . Knee Injury     (Consider location/radiation/quality/duration/timing/severity/associated sxs/prior Treatment) HPI  This is a 65 year old male with a history of bilateral knee replacements. About 3 hours prior to arrival he was getting up from the couch and had the sudden onset of severe pain in his right knee. The knee is subsequently become swollen and he is unable to move that knee or bear weight on his right leg without severe pain. The pain radiates to his right hip and right shin. He was given 100 micrograms of fentanyl IV EMS prior to arrival without relief.  Past Medical History  Diagnosis Date  . Hypertension   . Thyroid disease   . Hemorrhoids   . Hypothyroidism   . Tachycardia     PT STATES HIS HEART RATE USUALLY 100 OR MORE  . Depression   . Anxiety   . GERD (gastroesophageal reflux disease)     PREVACID IF NEEDED  . Arthritis     RHEUMATOID ARTHRITIS; OA LEFT KNEE  . 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 Sleep apnea     CLAUSTROPHOBIC - COULD NOT TOLERATE CPAP MASK  . Heart murmur   . Pancreatitis, alcoholic, acute   . Cancer ([place])     MELANOMA REMOVED RT SHOULDER   Past Surgical History  Procedure Laterality Date  . Knee surgery      BILATERAL KNEE ARTHROSCOPY  . Shoulder surgery      RIGHT ROTATOR CUFF REPAIR AND LEFT ARTHROSCOPY  . Elbow surgery      BILATERAL ELBOW SURGERY  . Refractive surgery    . Total knee arthroplasty Left 08/23/2013    Procedure: LEFT TOTAL KNEE ARTHROPLASTY;  Surgeon: Tobi Bastos, MD;  Location: [facility];  Service: Orthopedics;  Laterality: Left;  . Colonscopy     . Left tkr      July 2015  . Total knee arthroplasty Right 02/07/2014    Procedure: RIGHT  TOTAL KNEE ARTHROPLASTY;  Surgeon: Tobi Bastos, MD;  Location: [facility];  Service: Orthopedics;  Laterality: Right;  . Lumbar laminectomy/decompression microdiscectomy Left 06/06/2014    Procedure: LEFT L4-5 DECOMPRESSION ;  Surgeon: Melina Schools, MD;  Location: [facility];  Service: Orthopedics;  Laterality: Left;   Family History  Problem Relation Age of Onset  . Hypertension Mother   . Other Mother     varicose veins  . Hypertension Sister    Social History  Substance Use Topics  . Smoking status: Former Smoker -- 1.50 packs/day for 35 years    Types: Cigars    Quit date: 02/22/1998  . Smokeless tobacco: Never Used     Comment: used cigarettes 20 years ago  . Alcohol Use: No     Comment: " last alcohol use was in December 2015."    Review of Systems  All other systems reviewed and are negative.   Allergies  Review of patient's allergies indicates no known allergies.  Home Medications   Prior to Admission medications   Medication Sig Start Date End Date Taking? Authorizing Provider  ALPRAZolam Duanne Moron) 0.25 MG tablet Take 0.25 mg by mouth at bedtime as  needed. anxiety 04/23/14   Historical Provider, MD  cholecalciferol (VITAMIN D) 1000 UNITS tablet Take 3,000 Units by mouth daily.    Historical Provider, MD  dicyclomine (BENTYL) 20 MG tablet Take 1 tablet (20 mg total) by mouth 2 (two) times daily. 10/22/14   Tatyana Kirichenko, PA-C  escitalopram (LEXAPRO) 20 MG tablet Take 20 mg by mouth every morning.    Historical Provider, MD  folic acid (FOLVITE) 1 MG tablet Take 1 tablet (1 mg total) by mouth daily. Patient not taking: Reported on 06/04/2014 05/23/14   Robbie Lis, MD  levothyroxine (SYNTHROID, LEVOTHROID) 175 MCG tablet Take 175 mcg by mouth daily before breakfast.    Historical Provider, MD  lisinopril (PRINIVIL,ZESTRIL) 20 MG tablet Take 20 mg by mouth daily with breakfast.    Historical Provider, MD  LORazepam (ATIVAN) 1 MG tablet Take 1 tablet (1 mg total) by mouth  every 6 (six) hours as needed (CIWA-AR > 8  -OR-  withdrawal symptoms:  anxiety, agitation, insomnia, diaphoresis, nausea, vomiting, tremors, tachycardia, or hypertension.). Patient not taking: Reported on 10/22/2014 05/23/14   Robbie Lis, MD  OLANZapine-FLUoxetine [facility]) 12-50 MG per capsule Take 1 capsule by mouth at bedtime.    Historical Provider, MD  ondansetron (ZOFRAN) 4 MG tablet Take 1 tablet (4 mg total) by mouth every 8 (eight) hours as needed for nausea or vomiting. 06/07/14   Melina Schools, MD  [place] into both eyes daily as needed (contacts.). Flushes eyes    Historical Provider, MD  promethazine (PHENERGAN) 12.5 MG tablet Take 12.5 mg by mouth every 6 (six) hours as needed for nausea or vomiting.    Historical Provider, MD  sucralfate (CARAFATE) 1 G tablet Take 1 tablet (1 g total) by mouth 4 (four) times daily -  with meals and at bedtime. 12/01/14   Domenic Moras, PA-C   BP 115/64 mmHg  Pulse 94  Temp(Src) 98.1 F (36.7 C) (Oral)  Resp 24  Ht 5\' 11"  (1.803 m)  Wt 200 lb (90.719 kg)  BMI 27.91 kg/m2  SpO2 100%   Physical Exam General: Well-developed, well-nourished male in no acute distress; appearance consistent with age of record HENT: normocephalic; atraumatic Eyes: pupils equal, round and reactive to light; extraocular muscles intact Neck: supple Heart: regular rate and rhythm Lungs: clear to auscultation bilaterally Abdomen: soft; nondistended; nontender; bowel sounds present Extremities: No deformity; full range of motion except right knee and hip due to knee pain; pulses normal; right knee effusion with tenderness and pain on attempted range of motion, right lower extremity distally neurovascularly intact Neurologic: Awake, alert and oriented; motor function intact in all extremities and symmetric; no facial droop Skin: Warm and dry Psychiatric: Normal mood and affect    ED Course  Procedures (including critical care time)   MDM   Nursing notes and vitals signs, including pulse oximetry, reviewed.  Summary of this visit's results, reviewed by myself:  Imaging Studies: Dg Knee Complete 4 Views Right  07/14/2015  CLINICAL DATA:  Felt right knee pop out of place while stepping down, with right knee pain. Initial encounter. EXAM: RIGHT KNEE - COMPLETE 4+ VIEW COMPARISON:  None. FINDINGS: There is no evidence of fracture or dislocation. The patient's total knee arthroplasty is grossly unremarkable in appearance. The joint spaces are preserved. No significant degenerative change is seen; the patellofemoral joint is grossly unremarkable in appearance. No significant joint effusion is seen. The visualized soft tissues are normal in appearance. IMPRESSION: 1.  No evidence of fracture or dislocation. 2. Total knee arthroplasty is grossly unremarkable in appearance. Electronically Signed   By: Garald Balding M.D.   On: 07/14/2015 03:19   Dg Hip Unilat With Pelvis 2-3 Views Right  07/14/2015  CLINICAL DATA:  Stepped down and felt right knee pop out of place. Right hip pain. Initial encounter. EXAM: DG HIP (WITH OR WITHOUT PELVIS) 2-3V RIGHT COMPARISON:  None. FINDINGS: There is no evidence of fracture or dislocation. Both femoral heads are seated normally within their respective acetabula. The proximal right femur appears intact. No significant degenerative change is appreciated. The sacroiliac joints are unremarkable in appearance. The visualized bowel gas pattern is grossly unremarkable in appearance. IMPRESSION: No evidence of fracture or dislocation. Electronically Signed   By: Garald Balding M.D.   On: 07/14/2015 03:20   7:13 AM The patient is still complaining of pain but became hypotensive after his initial dose of Dilaudid. He is getting an IV fluid bolus and he will be reevaluated by Dr. [name].    Shanon Rosser, MD 07/14/15 [phone]

## 2015-07-14 NOTE — ED Notes (Signed)
Patient asking for more pain medication. MD aware

## 2015-07-14 NOTE — ED Notes (Signed)
Patient continually asking for pain medications, he is smiling and laughing in the room with slurred speech. The patient told repeatedly that it is unsafe to give him any further pain meds. The patient states "you could just sneak one more dose in"

## 2015-07-14 NOTE — ED Notes (Signed)
MD at bedside. 

## 2015-07-14 NOTE — ED Notes (Signed)
Per MD order: pressure bag placed on IVF for rapid infusion.

## 2015-07-14 NOTE — ED Notes (Signed)
MD at bedside attempting ultrasound PIV placement.

## 2015-07-14 NOTE — ED Notes (Signed)
Patient asking for more and more pain medications and nausea medications. IVF started for decreased BP - family and patient educated about decreased BP and pain medications

## 2015-07-14 NOTE — Op Note (Signed)
NAME:  Juan Wiggins, STUECK NO.:  [id number]  MEDICAL RECORD NO.:  [id number]  LOCATION:  [facility]                         FACILITY:  [place]  PHYSICIAN:  Metta Clines. Giorgio Chabot, M.D.  DATE OF BIRTH:  [birth year]  DATE OF PROCEDURE:  07/14/2015 DATE OF DISCHARGE:                              OPERATIVE REPORT   PREOPERATIVE DIAGNOSIS:  Septic right total knee arthroplasty.  POSTOPERATIVE DIAGNOSIS:  Septic right total knee arthroplasty.  PROCEDURE:  Right knee arthroscopic lavage and extensive synovectomy.  SURGEON:  Metta Clines. Mayo Faulk, M.D.  ASSISTANTOlivia Mackie, PA-C.  ANESTHESIA:  General endotracheal.  ESTIMATED BLOOD LOSS:  Minimal.  DRAINS:  Hemovac x1.  SPECIMENS:  Fluid from the joint was aspirated and sent for routine culture and sensitivity.  The fluid was found to be cloudy and brownish, consistency of chocolate milk.  HISTORY:  Mr. Dudek is a 65 year old gentleman who is status post previous bilateral total knee arthroplasties by my partner, Dr. Lauro Regulus in 2014 and 2015.  He initially did well with both of his knees.  More recently, he has had some deterioration in his overall health related due to some underlying chronic medical conditions.  He had a recent hospitalization over at [facility] where he was treated for respiratory failure as a result of pneumonia as well as acute renal failure and sepsis.  Apparently, he had an abscess in the right wrist which was treated with antibiotics and apparently local I and D.  He was dialyzed for a short period of time.  Apparently, was intubated for some portion of his hospitalization.  The exact details of recent hospitalization are not entirely clear or available at this time, but certainly there was a period in the ICU where he is quite medically compromised.  Apparently, he has been receiving some intermittent hemodialysis.  He recovers his renal function.  Apparently, had his dialysis  catheter removed last week.  His current admission occurs today as a result of his reporting an acute increase in right knee pain.  States he simply woke up from sleeping this morning as he stepped on the right leg and abrupt onset of quite severe pain as well swelling.  Subsequently brought by EMS to [facility] where on evaluation he was noted to be pale and diaphoretic with diffuse swelling about the right knee from the mid thigh to the calf.  On my examination, there is very minimal erythema.  Exquisitely tender to palpation.  Marked effusion of the right knee.  There were no other painful joints and nonfocal physical examination other than the right knee.  I performed an aspiration at bedside and this revealed easily 60 mL of cloudy brownish thick synovial fluid consistent with septic arthritis.  He subsequently was brought to the operating room at this time for planned urgent arthroscopic lavage and debridement.  Mr. Beson denies any recent IV drug use or abuse.  He has been on a steroid taper over the last several weeks.  Preoperatively, I counseled Mr. Matsuyama regarding treatment options and potential risks versus benefits thereof.  Possible surgical complications were reviewed including bleeding, infection, vascular injury, DVT, PE, as well  as persistence of infection and likely need for additional surgery in the future to treat this a septic arthritis.  He understands and accepts and agrees with the planned procedure.  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 Factor into our treatment algorithm.  PROCEDURE IN DETAIL:  After undergoing routine and extensive preop evaluation and being continued on the previously  initiated antibiotic therapy, the patient was brought to the operating room and placed on the operative table in a supine position and underwent smooth induction of an LMA after general endotracheal anesthesia.  Right leg was placed in leg holder and sterilely prepped and draped in standard fashion.  Time- out was called.  Standard arthroscopy portal established and diagnostic arthroscopy was performed.  Introducing the trocar allowed [place] to evacuate 50-60 mL of very purulent brownish synovial fluid.  Standard portal established and diagnostic arthroscopy was performed.  Marked synovial overgrowth and changes consistent with a septic arthritis were noted throughout the joint including fibrinous exudate.  Introduced a shaver and performed an aggressive synovectomy in all aspects of the joint including the suprapatellar pouch, gutters, periarticular soft tissues.  We irrigated the joint aggressively.  We flushed 6 L of fluid through the joint and the course of lavage and synovectomy.  At the completion, we introduced a large Hemovac drain into the joint.  The portals were closed with simple nylon sutures.  Bulky dry dressing wrapped about the knee.  Leg was wrapped with Ace bandage, thigh support stocking.  The patient was then awakened, extubated, and taken to recovery room in stable condition.     Metta Clines. Krista Godsil, M.D.     KMS/MEDQ  D:  07/14/2015  T:  07/14/2015  Job:  [id number]

## 2015-07-14 NOTE — Anesthesia Preprocedure Evaluation (Signed)
Anesthesia Evaluation  Patient identified by MRN, date of birth, ID band Patient awake    Reviewed: Allergy & Precautions, NPO status , Patient's Chart, lab work & pertinent test results  Airway Mallampati: II   Neck ROM: Full    Dental  (+) Teeth Intact, Dental Advisory Given   Pulmonary former smoker,    breath sounds clear to auscultation       Cardiovascular hypertension,  Rhythm:Regular Rate:Normal     Neuro/Psych    GI/Hepatic   Endo/Other    Renal/GU      Musculoskeletal   Abdominal   Peds  Hematology   Anesthesia Other Findings   Reproductive/Obstetrics                             Anesthesia Physical Anesthesia Plan  ASA: III  Anesthesia Plan: General   Post-op Pain Management:    Induction: Intravenous  Airway Management Planned: Oral ETT  Additional Equipment:   Intra-op Plan:   Post-operative Plan:   Informed Consent: I have reviewed the patients History and Physical, chart, labs and discussed the procedure including the risks, benefits and alternatives for the proposed anesthesia with the patient or authorized representative who has indicated his/her understanding and acceptance.   Dental advisory given  Plan Discussed with: CRNA and Anesthesiologist  Anesthesia Plan Comments:         Anesthesia Quick Evaluation

## 2015-07-14 NOTE — Anesthesia Postprocedure Evaluation (Signed)
Anesthesia Post Note  Patient: Juan Wiggins  Procedure(s) Performed: Procedure(s) (LRB): ARTHROSCOPY IRRIGATION AND DEBRIDEMENT - KNEE (Right)  Patient location during evaluation: PACU Anesthesia Type: General Level of consciousness: awake, awake and alert and oriented Pain management: pain level controlled Vital Signs Assessment: post-procedure vital signs reviewed and stable Respiratory status: spontaneous breathing, respiratory function stable and nonlabored ventilation Cardiovascular status: blood pressure returned to baseline Anesthetic complications: no    Last Vitals:  Filed Vitals:   07/14/15 1658 07/14/15 1700  BP: 141/62   Pulse: 87 87  Temp:  37.2 C  Resp: 25 19    Last Pain:  Filed Vitals:   07/14/15 1707  PainSc: 7                  Zemirah Krasinski COKER

## 2015-07-14 NOTE — ED Provider Notes (Addendum)
Patient care assumed from Dr. Florina Ou at 0730.  Pt seen by myself at 08:00.  Patient had received 2 L of fluid and pressures remained in the 99991111 range systolic. He continued to [facility] well and well oxygenated. No tachycardia.  I reviewed his recent hospitalization from [facility]. He was admitted for a right arm infection that grew group A beta strep, he developed ARDS, and acute renal failure/AKI requiring dialysis and he was discharged 10 days ago. His creatinine had improved to 1.8 at discharge.   EXAM:  his conjunctivae are pale. His nail beds are pale.  He is awake and mentating well.  His mucous membranes are moist.  His neck is supple.  His lungs are clear.  He has no increased work of breathing.  He is not tachycardic.  His abdomen is soft and benign.  The skin of his abdomen and lower chest are erythematous without differentiated rash.  He complains of severe right knee pain. There is diffuse soft tissue swelling noted on the anterior aspect of the knee about the patella. I cannot determine joint effusion clinically. It is not erythematous. His extremities are well perfused. His nailbeds are pale.  I rechecked oral temp on him myself, it was 100.1. Nurses checked a rectal temp which is 100.6. This point I feel this is very likely sepsis declaring itself as hypotension, followed by fever.  Consideration would have to be for the knee being a source although no indications clinically or radiographically of this.  Code sepsis was declared. Using ultrasound I was able to obtain an additional 20-gauge IV catheter. Angiocath insertion Performed by: Lolita Patella  Consent: Verbal consent obtained. Risks and benefits: risks, benefits and alternatives were discussed Time out: Immediately prior to procedure a "time out" was called to verify the correct patient, procedure, equipment, support staff and site/side marked as required.  Preparation: Patient was prepped and draped in the usual  sterile fashion.  Vein Location: right AC  +Ultrasound Guided  Gauge: 20  Normal blood return and flush without difficulty Patient tolerance: Patient tolerated the procedure well with no immediate complications.  Patient was given 4075mL of normal saline. Pressures remained in 80. Levophed has been initiated. Blood cultures were obtained. Lactate is elevated at 4. WBC 40,000. He's had deterioration in his creatinine to 2.5, from his most recent out patient Cr. Of 1.8  Blood and urine cultures have been obtained. Sedimentation rate CRP have been obtained. Chest x-ray pending.   I discussed the case with Dr. Halford Chessman of pulmonary critical care. He will accept the patient in transfer to [facility] ICU.   On 10 mcg of Levophed, pt SBP 115, MAP 72.  Dx:  Sepsis c Septic Shock        ?septic knee as source  CRITICAL CARE Performed by: Tanna Furry JOSEPH   Total critical care time: 90 minutes  Critical care time was exclusive of separately billable procedures and treating other patients.  Critical care was necessary to treat or prevent imminent or life-threatening deterioration.  Critical care was time spent personally by me on the following activities: development of treatment plan with patient and/or surrogate as well as nursing, discussions with consultants, evaluation of patient's response to treatment, examination of patient, obtaining history from patient or surrogate, ordering and performing treatments and interventions, ordering and review of laboratory studies, ordering and review of radiographic studies, pulse oximetry and re-evaluation of patient's condition.      Tanna Furry, MD 07/14/15 [phone]  Elta Guadeloupe  [name], MD 07/14/15 [place], MD 07/14/15 [place], MD 07/14/15 1512  Tanna Furry, MD 07/14/15 [place], MD 07/14/15 [phone]

## 2015-07-14 NOTE — Transfer of Care (Signed)
Immediate Anesthesia Transfer of Care Note  Patient: Juan Wiggins  Procedure(s) Performed: Procedure(s): ARTHROSCOPY IRRIGATION AND DEBRIDEMENT - KNEE (Right)  Patient Location: PACU  Anesthesia Type:General  Level of Consciousness: awake, alert , oriented and patient cooperative  Airway & Oxygen Therapy: Patient Spontanous Breathing and Patient connected to face mask oxygen  Post-op Assessment: Report given to RN, Post -op Vital signs reviewed and stable and Patient moving all extremities  Post vital signs: Reviewed and stable  Last Vitals:  Filed Vitals:   07/14/15 1300 07/14/15 1400  BP: 116/72 87/59  Pulse: 86 89  Temp:    Resp: 16 23    Last Pain:  Filed Vitals:   07/14/15 1427  PainSc: 7          Complications: No apparent anesthesia complications

## 2015-07-14 NOTE — Op Note (Signed)
07/14/2015  3:55 PM  PATIENT:   Juan Wiggins  65 y.o. male  PRE-OPERATIVE DIAGNOSIS:  septic right total knee  POST-OPERATIVE DIAGNOSIS:  same  PROCEDURE:  Arthroscopic lavage and synovectomy right knee  SURGEON:  Betty Brooks, Metta Clines. M.D.  ASSISTANTS: Shuford pac   ANESTHESIA:   GET   EBL: min  SPECIMEN:  Joint fluid for C and S  Drains: hemovac x1   PATIENT DISPOSITION:  PACU - hemodynamically stable.    PLAN OF CARE:  Admit to inpatient   Ongoing medical management per critical care/hospitalists Maintain hemovac until output minimal Will contact Dr. Gladstone Lighter for ongoing orthopedic management OK for [name]  Dictation# [id number]   Contact # [phone]

## 2015-07-14 NOTE — ED Notes (Signed)
Patient reports that x-ray worked him over andPatient asking for more pain medication. MD aware

## 2015-07-14 NOTE — Anesthesia Procedure Notes (Signed)
Procedure Name: Intubation Date/Time: 07/14/2015 3:13 PM Performed by: Greggory Stallion, Braelyn Bordonaro L Pre-anesthesia Checklist: Patient identified, Emergency Drugs available, Suction available, Patient being monitored and Timeout performed Patient Re-evaluated:Patient Re-evaluated prior to inductionOxygen Delivery Method: Circle system utilized Preoxygenation: Pre-oxygenation with 100% oxygen Intubation Type: IV induction, Cricoid Pressure applied and Rapid sequence Laryngoscope Size: Mac and 4 Grade View: Grade I Tube size: 7.5 mm Number of attempts: 1 Airway Equipment and Method: Stylet Placement Confirmation: ETT inserted through vocal cords under direct vision,  positive ETCO2 and breath sounds checked- equal and bilateral Secured at: 22 cm Tube secured with: Tape Dental Injury: Teeth and Oropharynx as per pre-operative assessment

## 2015-07-14 NOTE — ED Notes (Signed)
Patient states that he stepped down and he felt his right knee pop out of place. The patient is having pain to his right hip and his right knee. Deformity is noted

## 2015-07-15 DIAGNOSIS — N189 Chronic kidney disease, unspecified: Secondary | ICD-10-CM

## 2015-07-15 DIAGNOSIS — A409 Streptococcal sepsis, unspecified: Secondary | ICD-10-CM

## 2015-07-15 DIAGNOSIS — N179 Acute kidney failure, unspecified: Secondary | ICD-10-CM

## 2015-07-15 DIAGNOSIS — R7881 Bacteremia: Secondary | ICD-10-CM

## 2015-07-15 LAB — URINE CULTURE: Culture: NO GROWTH

## 2015-07-15 LAB — COMPREHENSIVE METABOLIC PANEL
ALBUMIN: 2.1 g/dL — AB (ref 3.5–5.0)
ALT: 21 U/L (ref 17–63)
ANION GAP: 4 — AB (ref 5–15)
AST: 21 U/L (ref 15–41)
Alkaline Phosphatase: 61 U/L (ref 38–126)
BILIRUBIN TOTAL: 0.6 mg/dL (ref 0.3–1.2)
BUN: 31 mg/dL — ABNORMAL HIGH (ref 6–20)
CALCIUM: 7.4 mg/dL — AB (ref 8.9–10.3)
CO2: 22 mmol/L (ref 22–32)
CREATININE: 2.53 mg/dL — AB (ref 0.61–1.24)
Chloride: 114 mmol/L — ABNORMAL HIGH (ref 101–111)
GFR calc Af Amer: 29 mL/min — ABNORMAL LOW (ref >60)
GFR calc non Af Amer: 25 mL/min — ABNORMAL LOW (ref >60)
GLUCOSE: 135 mg/dL — AB (ref 65–99)
Potassium: 5.5 mmol/L — ABNORMAL HIGH (ref 3.5–5.1)
SODIUM: 140 mmol/L (ref 135–145)
TOTAL PROTEIN: 4.8 g/dL — AB (ref 6.5–8.1)

## 2015-07-15 LAB — BLOOD CULTURE ID PANEL (REFLEXED)
ACINETOBACTER BAUMANNII: NOT DETECTED
ACINETOBACTER BAUMANNII: NOT DETECTED
CANDIDA ALBICANS: NOT DETECTED
CANDIDA PARAPSILOSIS: NOT DETECTED
CARBAPENEM RESISTANCE: NOT DETECTED
Candida albicans: NOT DETECTED
Candida glabrata: NOT DETECTED
Candida glabrata: NOT DETECTED
Candida krusei: NOT DETECTED
Candida krusei: NOT DETECTED
Candida parapsilosis: NOT DETECTED
Candida tropicalis: NOT DETECTED
Candida tropicalis: NOT DETECTED
Carbapenem resistance: NOT DETECTED
ENTEROBACTER CLOACAE COMPLEX: NOT DETECTED
ENTEROBACTERIACEAE SPECIES: NOT DETECTED
ENTEROBACTERIACEAE SPECIES: NOT DETECTED
ENTEROCOCCUS SPECIES: NOT DETECTED
ESCHERICHIA COLI: NOT DETECTED
Enterobacter cloacae complex: NOT DETECTED
Enterococcus species: NOT DETECTED
Escherichia coli: NOT DETECTED
HAEMOPHILUS INFLUENZAE: NOT DETECTED
Haemophilus influenzae: NOT DETECTED
KLEBSIELLA OXYTOCA: NOT DETECTED
KLEBSIELLA PNEUMONIAE: NOT DETECTED
KLEBSIELLA PNEUMONIAE: NOT DETECTED
Klebsiella oxytoca: NOT DETECTED
LISTERIA MONOCYTOGENES: NOT DETECTED
Listeria monocytogenes: NOT DETECTED
METHICILLIN RESISTANCE: NOT DETECTED
METHICILLIN RESISTANCE: NOT DETECTED
NEISSERIA MENINGITIDIS: NOT DETECTED
Neisseria meningitidis: NOT DETECTED
PSEUDOMONAS AERUGINOSA: NOT DETECTED
PSEUDOMONAS AERUGINOSA: NOT DETECTED
Proteus species: NOT DETECTED
Proteus species: NOT DETECTED
SERRATIA MARCESCENS: NOT DETECTED
STAPHYLOCOCCUS AUREUS BCID: NOT DETECTED
STAPHYLOCOCCUS SPECIES: NOT DETECTED
STAPHYLOCOCCUS SPECIES: NOT DETECTED
STREPTOCOCCUS AGALACTIAE: NOT DETECTED
STREPTOCOCCUS PNEUMONIAE: NOT DETECTED
STREPTOCOCCUS PYOGENES: NOT DETECTED
STREPTOCOCCUS SPECIES: DETECTED — AB
Serratia marcescens: NOT DETECTED
Staphylococcus aureus (BCID): NOT DETECTED
Streptococcus agalactiae: NOT DETECTED
Streptococcus pneumoniae: NOT DETECTED
Streptococcus pyogenes: NOT DETECTED
Streptococcus species: DETECTED — AB
VANCOMYCIN RESISTANCE: NOT DETECTED
Vancomycin resistance: NOT DETECTED

## 2015-07-15 LAB — CBC
HCT: 25 % — ABNORMAL LOW (ref 39.0–52.0)
Hemoglobin: 8 g/dL — ABNORMAL LOW (ref 13.0–17.0)
MCH: 30.3 pg (ref 26.0–34.0)
MCHC: 32 g/dL (ref 30.0–36.0)
MCV: 94.7 fL (ref 78.0–100.0)
PLATELETS: 165 10*3/uL (ref 150–400)
RBC: 2.64 MIL/uL — AB (ref 4.22–5.81)
RDW: 15.7 % — ABNORMAL HIGH (ref 11.5–15.5)
WBC: 23.8 10*3/uL — ABNORMAL HIGH (ref 4.0–10.5)

## 2015-07-15 LAB — PROCALCITONIN: PROCALCITONIN: 21.03 ng/mL

## 2015-07-15 LAB — ABO/RH: ABO/RH(D): A NEG

## 2015-07-15 LAB — MAGNESIUM: Magnesium: 1.3 mg/dL — ABNORMAL LOW (ref 1.7–2.4)

## 2015-07-15 LAB — PREPARE RBC (CROSSMATCH)

## 2015-07-15 MED ORDER — OLANZAPINE-FLUOXETINE HCL 6-25 MG PO CAPS
2.0000 | ORAL_CAPSULE | Freq: Every day | ORAL | Status: DC
  Administered 2015-07-15 – 2015-07-20 (×6): 2 via ORAL
  Filled 2015-07-15 (×6): qty 2

## 2015-07-15 MED ORDER — MAGNESIUM SULFATE 4 GM/100ML IV SOLN
4.0000 g | Freq: Once | INTRAVENOUS | Status: AC
  Administered 2015-07-15: 4 g via INTRAVENOUS
  Filled 2015-07-15: qty 100

## 2015-07-15 MED ORDER — SODIUM CHLORIDE 0.9 % IV SOLN
Freq: Once | INTRAVENOUS | Status: AC
  Administered 2015-07-15: 18:00:00 via INTRAVENOUS

## 2015-07-15 MED ORDER — SODIUM POLYSTYRENE SULFONATE 15 GM/60ML PO SUSP
15.0000 g | Freq: Once | ORAL | Status: AC
  Administered 2015-07-15: 15 g via ORAL
  Filled 2015-07-15: qty 60

## 2015-07-15 NOTE — Progress Notes (Signed)
CRITICAL VALUE ALERT  Critical value received:  Blood cultures ID showed streptococcus species  Date of notification:  07/15/15    Time of notification:  0350  Critical value read back:Yes.    Nurse who received alert:  Bronwen Betters   MD notified (1st page):  Dr. Elsworth Soho  Time of first page:  0405  MD notified (2nd page):  Time of second page:  Responding MD:  Dr. Elsworth Soho  Time MD responded:  [phone]

## 2015-07-15 NOTE — Progress Notes (Signed)
CRITICAL VALUE ALERT  Critical value received:  Blood cultures aerobic and anaerobic gram + cocci in chains  Date of notification:  07/15/15  Time of notification:  0133  Critical value read back:Yes.    Nurse who received alert:  Bronwen Betters   MD notified (1st page):  Dr. Elsworth Soho  Time of first page:  0133  MD notified (2nd page):  Time of second page:  Responding MD:  Dr. Elsworth Soho  Time MD responded:  [phone]

## 2015-07-15 NOTE — Progress Notes (Signed)
[facility] Progress Note Patient Name: HAWTHORN PATI DOB: 01-07-1951 MRN: [id number]   Date of Service  07/15/2015  HPI/Events of Note    eICU Interventions  hypomag- repleteed     Intervention Category Intermediate Interventions: Electrolyte abnormality - evaluation and management  ALVA,RAKESH V. 07/15/2015, 4:24 AM

## 2015-07-15 NOTE — Progress Notes (Signed)
Subjective: 1 Day Post-Op Procedure(s) (LRB): ARTHROSCOPY IRRIGATION AND DEBRIDEMENT - KNEE (Right) Patient reports pain as 3 on 0-10 scale. Case discussed with Dr. Onnie Graham and I will take him back to Surgery tomorrow and do an OPEN debridemet and POLY exchange of his Right Total Knee. Thus far he has a Strept  Infection which was shown on his culture. Also positive blood culture.HBg 8.0. Will Type and Cross. And transfuse with 2-units of packed RBC.  Objective: Vital signs in last 24 hours: Temp:  [98.2 F (36.8 C)-99 F (37.2 C)] 98.6 F (37 C) (05/22 1136) Pulse Rate:  [80-98] 80 (05/22 1100) Resp:  [12-25] 14 (05/22 1100) BP: (87-141)/(41-72) 117/53 mmHg (05/22 1100) SpO2:  [90 %-99 %] 95 % (05/22 1100) Weight:  [96.1 kg (211 lb 13.8 oz)] 96.1 kg (211 lb 13.8 oz) (05/22 0500)  Intake/Output from previous day: 05/21 0701 - 05/22 0700 In: 7021.9 [P.O.:540; I.V.:2831.9; IV Piggyback:3650] Out: 1992 [Urine:1805; Drains:130; Blood:57] Intake/Output this shift: Total I/O In: 830 [P.O.:480; I.V.:300; IV Piggyback:50] Out: 600 [Urine:600]   Recent Labs  07/14/15 0830 07/14/15 1832 07/15/15 0330  HGB 9.3* 9.1* 8.0*    Recent Labs  07/14/15 1832 07/15/15 0330  WBC 38.0* 23.8*  RBC 2.99* 2.64*  HCT 28.8* 25.0*  PLT 204 165    Recent Labs  07/14/15 1832 07/15/15 0330  NA 136 140  K 5.2* 5.5*  CL 108 114*  CO2 18* 22  BUN 31* 31*  CREATININE 2.51* 2.53*  GLUCOSE 112* 135*  CALCIUM 7.1* 7.4*   No results for input(s): LABPT, INR in the last 72 hours.  Dorsiflexion/Plantar flexion intact Compartment soft  Assessment/Plan: 1 Day Post-Op Procedure(s) (LRB): ARTHROSCOPY IRRIGATION AND DEBRIDEMENT - KNEE (Right) Continue ABX therapy due to Culture taken of surgical site during joint revision.Because of secondary Infection which started about a month ago when he developed abscesses on his arm. He was hospitalized ay another hospital for three weeks.Will take him back  to surgery tomorrow for an extensive debridement of his Right Knee and a POLY exchange.  Juan Wiggins A 07/15/2015, 11:57 AM

## 2015-07-15 NOTE — Evaluation (Signed)
Physical Therapy Evaluation Patient Details Name: Juan Wiggins MRN: [id number] DOB: [birth year] Today's Date: 07/15/2015   History of Present Illness  pt presents with Septic R TKR, Septic Shock, PNA, and AKI.  pt with recent admit to hospital in [place] for R wrist Abscess.  pt with hx of Polysubstance Abuse, Hep C, Hep A, Depression, Anxiety, HTN, Tachycardia, Pancreatitis, Bil TKR, RA, Sleep Apnea, R Rotator Cuff Repair, Lumbar Lami, and Melanoma.    Clinical Impression  Pt mobility and strength limited by pain in R knee.  Pt follows directions well and is able to get OOB to recliner.  Pt states he has a live-in caregiver for him and his dad, and indicates that she will be able to care for him at home.  Noted plan for return to OR tomorrow for a Poly exchange.  Will continue to follow along post-op ti increase mobility and independence.      Follow Up Recommendations Home health PT;Supervision/Assistance - 24 hour    Equipment Recommendations  None recommended by PT    Recommendations for Other Services       Precautions / Restrictions Precautions Precautions: Fall Restrictions Weight Bearing Restrictions: Yes RLE Weight Bearing: Weight bearing as tolerated      Mobility  Bed Mobility Overal bed mobility: Needs Assistance Bed Mobility: Supine to Sit     Supine to sit: Min assist;HOB elevated     General bed mobility comments: pt needs increased time and A with R LE throughout.  Transfers Overall transfer level: Needs assistance Equipment used: Rolling walker (2 wheeled) Transfers: Sit to/from [employer] Sit to Stand: Min assist Stand pivot transfers: Min assist       General transfer comment: A for balance with power up to standing and balance with pivot to recliner.  pt able to take small steps with extensive UE support during pivot to recliner.  R knee buckles and is unable to bear weight.    Ambulation/Gait                Stairs            Wheelchair Mobility    Modified Rankin (Stroke Patients Only)       Balance Overall balance assessment: Needs assistance Sitting-balance support: No upper extremity supported;Feet supported Sitting balance-Leahy Scale: Good     Standing balance support: Bilateral upper extremity supported;During functional activity Standing balance-Leahy Scale: Poor                               Pertinent Vitals/Pain Pain Assessment: 0-10 Pain Score: 7  Pain Location: R knee Pain Descriptors / Indicators: Aching;Burning;Grimacing Pain Intervention(s): Limited activity within patient's tolerance;Monitored during session;Premedicated before session;Repositioned    Home Living Family/patient expects to be discharged to:: Private residence Living Arrangements: Parent;Non-relatives/Friends (pt lives with his dad and they have a live-in caregiver.) Available Help at Discharge: Personal care attendant;Available 24 hours/day Type of Home: House Home Access: Stairs to enter   [employer] of Steps: couple Home Layout: Two level;Full bath on main level (Lives upstairs, but states he can sleep downstairs) Home Equipment: Walker - 2 wheels;Bedside commode (His Dad has the 3-in-1 over his toilet.)      Prior Function Level of Independence: Independent               Hand Dominance        Extremity/Trunk Assessment   Upper Extremity Assessment: Defer to  OT evaluation           Lower Extremity Assessment: RLE deficits/detail RLE Deficits / Details: Grossly AROM limited to pain free range of ~20 - 80 degrees.  pt grossly weak.       Communication   Communication: No difficulties  Cognition Arousal/Alertness: Awake/alert Behavior During Therapy: WFL for tasks assessed/performed Overall Cognitive Status: Within Functional Limits for tasks assessed                      General Comments      Exercises Total Joint Exercises Long Arc Quad:  AROM;Right;10 reps      Assessment/Plan    PT Assessment Patient needs continued PT services  PT Diagnosis Difficulty walking;Acute pain   PT Problem List Decreased strength;Decreased activity tolerance;Decreased range of motion;Decreased balance;Decreased mobility;Decreased coordination;Decreased knowledge of use of DME;Pain  PT Treatment Interventions DME instruction;Gait training;Stair training;Functional mobility training;Therapeutic activities;Therapeutic exercise;Balance training;Patient/family education   PT Goals (Current goals can be found in the Care Plan section) Acute Rehab PT Goals Patient Stated Goal: Walk without pain. PT Goal Formulation: With patient Time For Goal Achievement: 07/29/15 Potential to Achieve Goals: Good    Frequency Min 5X/week   Barriers to discharge        Co-evaluation PT/OT/SLP Co-Evaluation/Treatment: Yes Reason for Co-Treatment: For patient/therapist safety PT goals addressed during session: Mobility/safety with mobility;Balance;Proper use of DME;Strengthening/ROM         End of Session Equipment Utilized During Treatment: Gait belt Activity Tolerance: Patient tolerated treatment well Patient left: in chair;with call bell/phone within reach Nurse Communication: Mobility status         Time: [id number] PT Time Calculation (min) (ACUTE ONLY): 20 min   Charges:   PT Evaluation $PT Eval Moderate Complexity: 1 Procedure     PT G CodesCatarina Hartshorn, [place] 07/15/2015, 11:51 AM

## 2015-07-15 NOTE — Evaluation (Signed)
Occupational Therapy Evaluation Patient Details Name: Juan Wiggins MRN: [id number] DOB: Apr 17, 1950 Today's Date: 07/15/2015    History of Present Illness pt presents with Septic R TKR, Septic Shock, PNA, and AKI.  pt with recent admit to hospital in [place] for R wrist Abscess.  pt with hx of Polysubstance Abuse, Hep C, Hep A, Depression, Anxiety, HTN, Tachycardia, Pancreatitis, Bil TKR, RA, Sleep Apnea, R Rotator Cuff Repair, Lumbar Lami, and Melanoma.     Clinical Impression   Pt admitted with above. He demonstrates the below listed deficits and will benefit from continued OT to maximize safety and independence with BADLs.  Pt presents to OT with generalized weakness and acute pain.  He currently requires mod - max A for LB ADLs and min A +2 for safety during functional mobility.  Will follow.       Follow Up Recommendations  No OT follow up;Supervision/Assistance - 24 hour    Equipment Recommendations  None recommended by OT    Recommendations for Other Services       Precautions / Restrictions Precautions Precautions: Fall Restrictions Weight Bearing Restrictions: Yes RLE Weight Bearing: Weight bearing as tolerated      Mobility Bed Mobility Overal bed mobility: Needs Assistance Bed Mobility: Supine to Sit     Supine to sit: Min assist;HOB elevated     General bed mobility comments: pt needs increased time and A with R LE throughout.  Transfers Overall transfer level: Needs assistance Equipment used: Rolling walker (2 wheeled) Transfers: Sit to/from [facility] Sit to Stand: Min assist Stand pivot transfers: Min assist       General transfer comment: A for balance with power up to standing and balance with pivot to recliner.  pt able to take small steps with extensive UE support during pivot to recliner.  R knee buckles and is unable to bear weight.      Balance Overall balance assessment: Needs assistance Sitting-balance support: No upper  extremity supported Sitting balance-Leahy Scale: Good     Standing balance support: Bilateral upper extremity supported Standing balance-Leahy Scale: Poor                              ADL Overall ADL's : Needs assistance/impaired Eating/Feeding: Independent   Grooming: Wash/dry hands;Wash/dry face;Oral care;Set up;Sitting   Upper Body Bathing: Set up;Sitting   Lower Body Bathing: Moderate assistance;Sit to/from stand   Upper Body Dressing : Set up;Sitting   Lower Body Dressing: Maximal assistance;Sit to/from stand Lower Body Dressing Details (indicate cue type and reason): difficulty accessing feet due to pain  Toilet Transfer: Minimal assistance;+2 for safety/equipment;Stand-pivot;Comfort height toilet;RW;BSC   Toileting- Clothing Manipulation and Hygiene: Maximal assistance;Sit to/from stand       Functional mobility during ADLs: Minimal assistance;+2 for safety/equipment       Vision     Perception     Praxis      Pertinent Vitals/Pain Pain Assessment: 0-10 Pain Score: 7  Pain Location: Rt knee  Pain Descriptors / Indicators: Aching;Constant Pain Intervention(s): Monitored during session;Limited activity within patient's tolerance;Repositioned     Hand Dominance Right   Extremity/Trunk Assessment Upper Extremity Assessment Upper Extremity Assessment: Overall WFL for tasks assessed   Lower Extremity Assessment Lower Extremity Assessment: Defer to PT evaluation RLE Deficits / Details: Grossly AROM limited to pain free range of ~20 - 80 degrees.  pt grossly weak. RLE: Unable to fully assess due to pain RLE Sensation:  history of peripheral neuropathy RLE Coordination: decreased fine motor;decreased gross motor   Cervical / Trunk Assessment Cervical / Trunk Assessment: Normal   Communication Communication Communication: No difficulties   Cognition Arousal/Alertness: Awake/alert Behavior During Therapy: WFL for tasks  assessed/performed Overall Cognitive Status: Within Functional Limits for tasks assessed                     General Comments       Exercises Exercises: Total Joint     Shoulder Instructions      Home Living Family/patient expects to be discharged to:: Private residence Living Arrangements: Parent;Non-relatives/Friends (pt lives with his dad and they have a live-in caregiver.) Available Help at Discharge: Personal care attendant;Available 24 hours/day Type of Home: House Home Access: Stairs to enter [employer] of Steps: couple   Home Layout: Two level;Full bath on main level (Lives upstairs, but states he can sleep downstairs) Alternate Level Stairs-Number of Steps: flight Alternate Level Stairs-Rails: Right Bathroom Shower/Tub: Tub/shower unit Shower/tub characteristics: Architectural technologist: Standard Bathroom Accessibility: Yes   Home Equipment: Environmental consultant - 2 wheels;Bedside commode;Cane - single point;Crutches;Shower seat (His Dad has the 3-in-1 over his toilet.)   Additional Comments: Lives with elderly father who has a live in caregiver       Prior Functioning/Environment Level of Independence: Independent        Comments: Pt reports he works full time in [employer].  He initially states he travels and drives, but later in eval states caregiver does grocery shopping because he doesn't drive     OT Diagnosis: Generalized weakness;Acute pain   OT Problem List: Decreased strength;Decreased activity tolerance;Impaired balance (sitting and/or standing);Decreased safety awareness;Decreased knowledge of use of DME or AE;Pain   OT Treatment/Interventions: Self-care/ADL training;DME and/or AE instruction;Therapeutic activities;Patient/family education;Balance training    OT Goals(Current goals can be found in the care plan section) Acute Rehab OT Goals Patient Stated Goal: Walk without pain. OT Goal Formulation: With patient Time For Goal Achievement:  07/29/15 Potential to Achieve Goals: Good ADL Goals Pt Will Perform Grooming: with modified independence;standing Pt Will Perform Upper Body Bathing: with modified independence;sitting;standing Pt Will Perform Lower Body Bathing: with modified independence;sit to/from stand;with adaptive equipment Pt Will Perform Upper Body Dressing: with modified independence;sitting Pt Will Perform Lower Body Dressing: with modified independence;with adaptive equipment;sit to/from stand Pt Will Transfer to Toilet: with modified independence;ambulating;regular height toilet;bedside commode;grab bars Pt Will Perform Toileting - Clothing Manipulation and hygiene: with modified independence;sit to/from stand Pt Will Perform Tub/Shower Transfer: Tub transfer;with min guard assist;ambulating;shower seat;rolling walker  OT Frequency: Min 2X/week   Barriers to D/C:            Co-evaluation PT/OT/SLP Co-Evaluation/Treatment: Yes Reason for Co-Treatment: For patient/therapist safety PT goals addressed during session: Mobility/safety with mobility;Balance;Proper use of DME;Strengthening/ROM OT goals addressed during session: ADL's and self-care;Strengthening/ROM      End of Session Equipment Utilized During Treatment: Rolling walker;Gait belt Nurse Communication: Mobility status  Activity Tolerance: Patient limited by pain Patient left: in chair;with call bell/phone within reach   Time: 1034-1055 OT Time Calculation (min): 21 min Charges:  OT General Charges $OT Visit: 1 Procedure OT Evaluation $OT Eval Moderate Complexity: 1 Procedure G-Codes:    Treyvonne Tata M Jul 20, 2015, 1:02 PM

## 2015-07-15 NOTE — Progress Notes (Signed)
CRITICAL VALUE ALERT  Critical value received:  Body fluid from R knee showed gram + cocci  Date of notification:  07/14/15  Time of notification:  2045  Critical value read back:Yes.    Nurse who received alert:  Bronwen Betters  MD notified (1st page):  Dr. Emmit Alexanders  Time of first page:  2045  MD notified (2nd page):  Time of second page:  Responding MD:  Dr. Emmit Alexanders  Time MD responded:  2045

## 2015-07-15 NOTE — Progress Notes (Addendum)
PULMONARY / CRITICAL CARE MEDICINE   Name: Juan Wiggins MRN: [id number] DOB: 11-02-1950    ADMISSION DATE:  07/14/2015 CONSULTATION DATE: 07/14/15   REFERRING MD:  [facility]   CHIEF COMPLAINT:  Sepsis   SUBJECTIVE: Denies any chest pain or pressure. No dyspnea or cough. Reports continuing to have pain in right knee with movement.  REVIEW OF SYSTEMS:  No fever, chills, or sweats. No nausea or emesis. No abdominal pain.  VITAL SIGNS: BP 117/53 mmHg  Pulse 80  Temp(Src) 98.6 F (37 C) (Oral)  Resp 14  Ht 5\' 11"  (1.803 m)  Wt 211 lb 13.8 oz (96.1 kg)  BMI 29.56 kg/m2  SpO2 95%  INTAKE / OUTPUT: I/O last 3 completed shifts: In: 7021.9 [P.O.:540; I.V.:2831.9; IV Piggyback:3650] Out: 1992 [Urine:1805; Drains:130; Blood:57]  PHYSICAL EXAMINATION: General: Awake. No distress. Alert. Neuro: CN 2-12 in tact. Moving all 4 extremities. Grossly nonfocal. HEENT: Pupils equal. MMM. No oral ulcers. Cardiac: Regular rate. No edema. No JVD. Chest: CTAB. Normal work of breathing on room air. Speaking in complete sentences. Abd: Soft. Nondistended. Nontender. Integument:  Warm & dry. No rash on exposed skin.  LABS:  BMET  Recent Labs Lab 07/14/15 0830 07/14/15 1832 07/15/15 0330  NA 136 136 140  K 4.5 5.2* 5.5*  CL 109 108 114*  CO2 18* 18* 22  BUN 29* 31* 31*  CREATININE 2.51* 2.51* 2.53*  GLUCOSE 103* 112* 135*    Electrolytes  Recent Labs Lab 07/14/15 0830 07/14/15 1832 07/15/15 0330  CALCIUM 7.8* 7.1* 7.4*  MG  --   --  1.3*    CBC  Recent Labs Lab 07/14/15 0830 07/14/15 1832 07/15/15 0330  WBC 40.6* 38.0* 23.8*  HGB 9.3* 9.1* 8.0*  HCT 28.2* 28.8* 25.0*  PLT 241 204 165    Coag's No results for input(s): APTT, INR in the last 168 hours.  Sepsis Markers  Recent Labs Lab 07/14/15 0840 07/14/15 1707 07/14/15 1847 07/15/15 0330  LATICACIDVEN 4.02* 2.0  --   --   PROCALCITON  --   --  22.20 21.03    ABG No results for input(s):  PHART, PCO2ART, PO2ART in the last 168 hours.  Liver Enzymes  Recent Labs Lab 07/14/15 0830 07/14/15 1832 07/15/15 0330  AST 30 25 21   ALT 19 22 21   ALKPHOS 57 68 61  BILITOT 0.8 1.0 0.6  ALBUMIN 2.7* 2.4* 2.1*    Cardiac Enzymes No results for input(s): TROPONINI, PROBNP in the last 168 hours.  Glucose  Recent Labs Lab 07/14/15 1248  GLUCAP 109*    Imaging Dg Chest Port 1 View  07/14/2015  CLINICAL DATA:  65 year old with recent hospitalization for sepsis, immediate postop lavage and synovectomy of the right knee for a septic arthroplasty. Central venous catheter placement at bedside. EXAM: PORTABLE CHEST 1 VIEW 4:40 p.m.: COMPARISON:  Portable chest x-ray earlier today 9:19 a.m. and previously. FINDINGS: Left subclavian central venous catheter tip projects over the expected location of the junction of the innominate vein and SVC. No evidence of pneumothorax or mediastinal hematoma. Cardiac silhouette normal in size for technique. Lungs clear. Bronchovascular markings normal. Pulmonary vascularity normal. No visible pleural effusions. IMPRESSION: 1. Left subclavian central venous catheter tip projects at the expected location of the junction of the innominate vein and SVC. No acute complicating features. 2.  No acute cardiopulmonary disease. Electronically Signed   By: Evangeline Dakin M.D.   On: 07/14/2015 17:11     STUDIES:  HIP xray 5.21 >neg  R Knee 5/21 >>neg, prev arthroplasty   MICROBIOLOGY: 5/21 BC X2 >> GPC in chains 5/21 UC:  Negative  5/21 R Knee Fluid >>  ANTIBIOTICS: 5/21 Vancomycin >> 5/21 Cefepime >>  SIGNIFICANT EVENTS: 5/21 Transfer from [facility] >to [facility] ICU sepsis   LINES/TUBES: Lt Subclav CVL 5/21 >> PIV x2  ASSESSMENT / PLAN:  INFECTIOUS A:   Sepsis - Rt knee source. Bacteremia - Ctx pending. Recent Arm Cellulitis w/ Group A Strep  P:   Awaiting culture results Day #2 Vancomycin & Cefepime Plan for repeat blood cultures after 48 hours  if he remains afebrile Procalcitonin per algorithm Checking TTE  PULMONARY A: Bilateral Opacities on Imaging - Edema vs Infection.  P:   Wean FiO2 for Sat >92%  CARDIOVASCULAR A:  Septic Shock - Shock has resolved.  P:  Monitor on telemetry Holding home anti-hypertensives  RENAL A:   Acute on Chronic Renal Failure - Recent critical illness requiring HD. Baseline Creatinine 1.8. Hyperkalemia - Mild. Hypomagnesemia - Replaced IV 4gm IV.  P:   Trending electrolytes & renal function daily Replace electrolytes as indicated IVF at 100cc  Kayexalate 15gm PO x1 now  GASTROINTESTINAL A:   H/O Hepatitis C  P:   NPO after midnight for surgery Carafate PO  HEMATOLOGIC A:   Anemia - No signs of active bleeding. Leukocytosis - Secondary to sepsis.  P:  Trending cell counts daily w/ CBC  ENDOCRINE A:   Hypothyroid   P:   Cont Synthroid PO Monitor blood glucose with daily labs  NEUROLOGIC A:   H/O Depression  H/O Anxiety Chronic Pain/Arthritis ETOH /polysubstance use  P:   Symbyax qhs Bentyl bid  Monitor for EtOH withdrawal Xanax qhs prn Dilaudid IV prn Percocet PO prn  FAMILY  - Updates: Wife updated at bedside by Dr. Ashok Cordia 5/22.  - Inter-disciplinary family meet or Palliative Care meeting due by:  5/28  TODAY'S SUMMARY:  65 yo male was in hospital at in [place] during April 2017 for Rt wrist cellulitis after tick bite.  He had Group A strep.  This was complicated by pneumonia and AKI requiring temporary dialysis.  He had dialysis catheter removed.  Patient now has Strep bacteremia and knee infection as well. Plan for repeat surgery tomorrow by Dr. Gladstone Lighter. Patient will remain in the ICU given his bacteremia and potential vasopressor needs with pending surgery tomorrow.  I have spent a total of 38 minutes of critical care time caring for the patient, discussing the plan of care with Orthopedic Surgery as well as his wife, and reviewing his electronic  medical record.   [name] Ashok Cordia, M.D. [facility] Pulmonary & Critical Care Pager:  [phone] After 3pm or if no response, call [phone] 11:42 AM 07/15/2015

## 2015-07-16 ENCOUNTER — Encounter (HOSPITAL_COMMUNITY)
Admission: EM | Disposition: A | Discharge status: Home Health | Payer: Self-pay | Source: Home / Self Care | Attending: Family Medicine

## 2015-07-16 ENCOUNTER — Inpatient Hospital Stay (HOSPITAL_COMMUNITY): Payer: 59

## 2015-07-16 ENCOUNTER — Inpatient Hospital Stay (HOSPITAL_COMMUNITY): Payer: 59 | Admitting: Anesthesiology

## 2015-07-16 ENCOUNTER — Encounter (HOSPITAL_COMMUNITY): Payer: Self-pay | Admitting: Orthopedic Surgery

## 2015-07-16 DIAGNOSIS — Z96659 Presence of unspecified artificial knee joint: Secondary | ICD-10-CM

## 2015-07-16 DIAGNOSIS — R7881 Bacteremia: Secondary | ICD-10-CM

## 2015-07-16 HISTORY — PX: I&D KNEE WITH POLY EXCHANGE: SHX5024

## 2015-07-16 LAB — RENAL FUNCTION PANEL
ANION GAP: 6 (ref 5–15)
Albumin: 2.1 g/dL — ABNORMAL LOW (ref 3.5–5.0)
BUN: 30 mg/dL — ABNORMAL HIGH (ref 6–20)
CALCIUM: 8 mg/dL — AB (ref 8.9–10.3)
CHLORIDE: 113 mmol/L — AB (ref 101–111)
CO2: 21 mmol/L — AB (ref 22–32)
CREATININE: 2.06 mg/dL — AB (ref 0.61–1.24)
GFR calc Af Amer: 38 mL/min — ABNORMAL LOW (ref >60)
GFR calc non Af Amer: 33 mL/min — ABNORMAL LOW (ref >60)
GLUCOSE: 93 mg/dL (ref 65–99)
Phosphorus: 3.2 mg/dL (ref 2.5–4.6)
Potassium: 4.7 mmol/L (ref 3.5–5.1)
SODIUM: 140 mmol/L (ref 135–145)

## 2015-07-16 LAB — RAPID URINE DRUG SCREEN, HOSP PERFORMED
AMPHETAMINES: NOT DETECTED
BARBITURATES: NOT DETECTED
BENZODIAZEPINES: POSITIVE — AB
Cocaine: POSITIVE — AB
Opiates: POSITIVE — AB
Tetrahydrocannabinol: NOT DETECTED

## 2015-07-16 LAB — TYPE AND SCREEN
ABO/RH(D): A NEG
ANTIBODY SCREEN: NEGATIVE
Unit division: 0
Unit division: 0

## 2015-07-16 LAB — CULTURE, BODY FLUID W GRAM STAIN -BOTTLE

## 2015-07-16 LAB — HEMOGLOBIN AND HEMATOCRIT, BLOOD
HEMATOCRIT: 30.6 % — AB (ref 39.0–52.0)
Hemoglobin: 9.6 g/dL — ABNORMAL LOW (ref 13.0–17.0)

## 2015-07-16 LAB — ECHOCARDIOGRAM COMPLETE
Height: 71 in
Weight: 3506.2 oz

## 2015-07-16 LAB — CBC WITH DIFFERENTIAL/PLATELET
Basophils Absolute: 0 10*3/uL (ref 0.0–0.1)
Basophils Relative: 0 %
EOS ABS: 0.3 10*3/uL (ref 0.0–0.7)
EOS PCT: 3 %
HCT: 29.3 % — ABNORMAL LOW (ref 39.0–52.0)
Hemoglobin: 9.4 g/dL — ABNORMAL LOW (ref 13.0–17.0)
LYMPHS ABS: 1.6 10*3/uL (ref 0.7–4.0)
LYMPHS PCT: 16 %
MCH: 29.2 pg (ref 26.0–34.0)
MCHC: 32.1 g/dL (ref 30.0–36.0)
MCV: 91 fL (ref 78.0–100.0)
MONOS PCT: 4 %
Monocytes Absolute: 0.4 10*3/uL (ref 0.1–1.0)
Neutro Abs: 7.7 10*3/uL (ref 1.7–7.7)
Neutrophils Relative %: 77 %
PLATELETS: 136 10*3/uL — AB (ref 150–400)
RBC: 3.22 MIL/uL — ABNORMAL LOW (ref 4.22–5.81)
RDW: 18.1 % — ABNORMAL HIGH (ref 11.5–15.5)
WBC: 9.9 10*3/uL (ref 4.0–10.5)

## 2015-07-16 LAB — MAGNESIUM: Magnesium: 2 mg/dL (ref 1.7–2.4)

## 2015-07-16 LAB — CULTURE, BODY FLUID-BOTTLE

## 2015-07-16 LAB — PROCALCITONIN: Procalcitonin: 12.64 ng/mL

## 2015-07-16 SURGERY — IRRIGATION AND DEBRIDEMENT KNEE WITH POLY EXCHANGE
Anesthesia: Choice | Site: Knee | Laterality: Right

## 2015-07-16 MED ORDER — ONDANSETRON HCL 4 MG/2ML IJ SOLN
4.0000 mg | Freq: Four times a day (QID) | INTRAMUSCULAR | Status: DC | PRN
  Administered 2015-07-19 – 2015-07-20 (×3): 4 mg via INTRAVENOUS
  Filled 2015-07-16 (×3): qty 2

## 2015-07-16 MED ORDER — PHENYLEPHRINE 40 MCG/ML (10ML) SYRINGE FOR IV PUSH (FOR BLOOD PRESSURE SUPPORT)
PREFILLED_SYRINGE | INTRAVENOUS | Status: DC | PRN
  Administered 2015-07-16: 40 ug via INTRAVENOUS

## 2015-07-16 MED ORDER — CHLORHEXIDINE GLUCONATE 4 % EX LIQD
60.0000 mL | Freq: Once | CUTANEOUS | Status: AC
  Administered 2015-07-16: 4 via TOPICAL
  Filled 2015-07-16 (×2): qty 60

## 2015-07-16 MED ORDER — FLEET ENEMA 7-19 GM/118ML RE ENEM
1.0000 | ENEMA | Freq: Once | RECTAL | Status: DC | PRN
  Filled 2015-07-16: qty 1

## 2015-07-16 MED ORDER — FENTANYL CITRATE (PF) 250 MCG/5ML IJ SOLN
INTRAMUSCULAR | Status: AC
  Filled 2015-07-16: qty 5

## 2015-07-16 MED ORDER — DEXAMETHASONE SODIUM PHOSPHATE 10 MG/ML IJ SOLN
INTRAMUSCULAR | Status: AC
  Filled 2015-07-16: qty 1

## 2015-07-16 MED ORDER — SODIUM CHLORIDE 0.9 % IJ SOLN
INTRAMUSCULAR | Status: DC | PRN
Start: 2015-07-16 — End: 2015-07-16
  Administered 2015-07-16 (×2): 10 mL

## 2015-07-16 MED ORDER — HYDROMORPHONE HCL 1 MG/ML IJ SOLN
INTRAMUSCULAR | Status: AC
  Filled 2015-07-16: qty 1

## 2015-07-16 MED ORDER — VANCOMYCIN HCL 1000 MG IV SOLR
INTRAVENOUS | Status: DC | PRN
  Administered 2015-07-16: 1000 mg

## 2015-07-16 MED ORDER — GENTAMICIN SULFATE 40 MG/ML IJ SOLN
INTRAMUSCULAR | Status: AC
  Filled 2015-07-16: qty 6

## 2015-07-16 MED ORDER — OXYCODONE-ACETAMINOPHEN 5-325 MG PO TABS
2.0000 | ORAL_TABLET | ORAL | Status: DC | PRN
  Administered 2015-07-17 – 2015-07-19 (×9): 2 via ORAL
  Filled 2015-07-16 (×9): qty 2

## 2015-07-16 MED ORDER — LIDOCAINE HCL (CARDIAC) 20 MG/ML IV SOLN: INTRAVENOUS | Status: DC | PRN

## 2015-07-16 MED ORDER — ACETAMINOPHEN 650 MG RE SUPP: 650.0000 mg | Freq: Four times a day (QID) | RECTAL | Status: DC | PRN

## 2015-07-16 MED ORDER — BISACODYL 5 MG PO TBEC: 5.0000 mg | DELAYED_RELEASE_TABLET | Freq: Every day | ORAL | Status: DC | PRN

## 2015-07-16 MED ORDER — ACETAMINOPHEN 325 MG PO TABS: 650.0000 mg | ORAL_TABLET | Freq: Four times a day (QID) | ORAL | Status: DC | PRN

## 2015-07-16 MED ORDER — HYDROMORPHONE HCL 1 MG/ML IJ SOLN
1.0000 mg | INTRAMUSCULAR | Status: DC | PRN
  Administered 2015-07-16 – 2015-07-19 (×9): 1 mg via INTRAVENOUS
  Filled 2015-07-16 (×9): qty 1

## 2015-07-16 MED ORDER — FENTANYL CITRATE (PF) 100 MCG/2ML IJ SOLN
INTRAMUSCULAR | Status: DC | PRN
Start: 2015-07-16 — End: 2015-07-16
  Administered 2015-07-16 (×2): 50 ug via INTRAVENOUS
  Administered 2015-07-16: 150 ug via INTRAVENOUS

## 2015-07-16 MED ORDER — CETYLPYRIDINIUM CHLORIDE 0.05 % MT LIQD
7.0000 mL | Freq: Two times a day (BID) | OROMUCOSAL | Status: DC
  Administered 2015-07-18 – 2015-07-21 (×7): 7 mL via OROMUCOSAL

## 2015-07-16 MED ORDER — VANCOMYCIN HCL 1000 MG IV SOLR
INTRAVENOUS | Status: AC
  Filled 2015-07-16: qty 1000

## 2015-07-16 MED ORDER — HYDROMORPHONE HCL 1 MG/ML IJ SOLN
0.2500 mg | INTRAMUSCULAR | Status: DC | PRN
  Administered 2015-07-16 (×3): 0.5 mg via INTRAVENOUS

## 2015-07-16 MED ORDER — ROCURONIUM BROMIDE 100 MG/10ML IV SOLN
INTRAVENOUS | Status: DC | PRN
  Administered 2015-07-16: 50 mg via INTRAVENOUS

## 2015-07-16 MED ORDER — SUGAMMADEX SODIUM 200 MG/2ML IV SOLN
INTRAVENOUS | Status: AC
  Filled 2015-07-16: qty 2

## 2015-07-16 MED ORDER — PHENOL 1.4 % MT LIQD
1.0000 | OROMUCOSAL | Status: DC | PRN
  Filled 2015-07-16: qty 177

## 2015-07-16 MED ORDER — MIDAZOLAM HCL 2 MG/2ML IJ SOLN
INTRAMUSCULAR | Status: AC
  Filled 2015-07-16: qty 2

## 2015-07-16 MED ORDER — BUPIVACAINE LIPOSOME 1.3 % IJ SUSP
20.0000 mL | Freq: Once | INTRAMUSCULAR | Status: DC
  Filled 2015-07-16: qty 20

## 2015-07-16 MED ORDER — METHOCARBAMOL 1000 MG/10ML IJ SOLN
500.0000 mg | Freq: Four times a day (QID) | INTRAVENOUS | Status: DC | PRN
  Filled 2015-07-16: qty 5

## 2015-07-16 MED ORDER — POLYETHYLENE GLYCOL 3350 17 G PO PACK
17.0000 g | PACK | Freq: Every day | ORAL | Status: DC | PRN
  Administered 2015-07-17: 17 g via ORAL
  Filled 2015-07-16 (×2): qty 1

## 2015-07-16 MED ORDER — MIDAZOLAM HCL 5 MG/5ML IJ SOLN
INTRAMUSCULAR | Status: DC | PRN
Start: 2015-07-16 — End: 2015-07-16
  Administered 2015-07-16: 2 mg via INTRAVENOUS

## 2015-07-16 MED ORDER — BUPIVACAINE LIPOSOME 1.3 % IJ SUSP
INTRAMUSCULAR | Status: DC | PRN
  Administered 2015-07-16: 20 mL

## 2015-07-16 MED ORDER — HYDROMORPHONE HCL 1 MG/ML IJ SOLN
INTRAMUSCULAR | Status: DC
Start: 2015-07-16 — End: 2015-07-16
  Filled 2015-07-16: qty 1

## 2015-07-16 MED ORDER — METHOCARBAMOL 500 MG PO TABS
500.0000 mg | ORAL_TABLET | Freq: Four times a day (QID) | ORAL | Status: DC | PRN
  Administered 2015-07-17 – 2015-07-21 (×7): 500 mg via ORAL
  Filled 2015-07-16 (×8): qty 1

## 2015-07-16 MED ORDER — ONDANSETRON HCL 4 MG PO TABS: 4.0000 mg | ORAL_TABLET | Freq: Four times a day (QID) | ORAL | Status: DC | PRN

## 2015-07-16 MED ORDER — ALUM & MAG HYDROXIDE-SIMETH 200-200-20 MG/5ML PO SUSP
30.0000 mL | ORAL | Status: DC | PRN
  Filled 2015-07-16 (×2): qty 30

## 2015-07-16 MED ORDER — SODIUM CHLORIDE 0.9 % IR SOLN
Status: DC | PRN
  Administered 2015-07-16: 500 mL

## 2015-07-16 MED ORDER — SUGAMMADEX SODIUM 200 MG/2ML IV SOLN
INTRAVENOUS | Status: DC | PRN
  Administered 2015-07-16: 200 mg via INTRAVENOUS

## 2015-07-16 MED ORDER — 0.9 % SODIUM CHLORIDE (POUR BTL) OPTIME
TOPICAL | Status: DC | PRN
  Administered 2015-07-16: 1000 mL

## 2015-07-16 MED ORDER — FERROUS SULFATE 325 (65 FE) MG PO TABS
325.0000 mg | ORAL_TABLET | Freq: Three times a day (TID) | ORAL | Status: DC
  Administered 2015-07-16 – 2015-07-18 (×7): 325 mg via ORAL
  Filled 2015-07-16 (×9): qty 1

## 2015-07-16 MED ORDER — ARTIFICIAL TEARS OP OINT
TOPICAL_OINTMENT | OPHTHALMIC | Status: AC
  Filled 2015-07-16: qty 3.5

## 2015-07-16 MED ORDER — PHENYLEPHRINE 40 MCG/ML (10ML) SYRINGE FOR IV PUSH (FOR BLOOD PRESSURE SUPPORT)
PREFILLED_SYRINGE | INTRAVENOUS | Status: AC
  Filled 2015-07-16: qty 10

## 2015-07-16 MED ORDER — DEXAMETHASONE SODIUM PHOSPHATE 4 MG/ML IJ SOLN
INTRAMUSCULAR | Status: DC | PRN
  Administered 2015-07-16: 8 mg via INTRAVENOUS

## 2015-07-16 MED ORDER — LIDOCAINE 2% (20 MG/ML) 5 ML SYRINGE
INTRAMUSCULAR | Status: DC | PRN
  Administered 2015-07-16: 100 mg via INTRAVENOUS

## 2015-07-16 MED ORDER — CHLORHEXIDINE GLUCONATE 0.12 % MT SOLN
15.0000 mL | Freq: Two times a day (BID) | OROMUCOSAL | Status: DC
  Administered 2015-07-16 – 2015-07-20 (×9): 15 mL via OROMUCOSAL
  Filled 2015-07-16 (×11): qty 15

## 2015-07-16 MED ORDER — LACTATED RINGERS IV SOLN: INTRAVENOUS | Status: DC | PRN

## 2015-07-16 MED ORDER — ONDANSETRON HCL 4 MG/2ML IJ SOLN
INTRAMUSCULAR | Status: DC | PRN
  Administered 2015-07-16: 4 mg via INTRAVENOUS

## 2015-07-16 MED ORDER — VANCOMYCIN HCL IN DEXTROSE 1-5 GM/200ML-% IV SOLN: 1000.0000 mg | Freq: Two times a day (BID) | INTRAVENOUS | Status: DC

## 2015-07-16 MED ORDER — MENTHOL 3 MG MT LOZG
1.0000 | LOZENGE | OROMUCOSAL | Status: DC | PRN
  Filled 2015-07-16: qty 9

## 2015-07-16 MED ORDER — PROPOFOL 10 MG/ML IV BOLUS
INTRAVENOUS | Status: DC | PRN
  Administered 2015-07-16: 100 mg via INTRAVENOUS
  Administered 2015-07-16: 50 mg via INTRAVENOUS

## 2015-07-16 MED ORDER — LACTATED RINGERS IV SOLN: INTRAVENOUS | Status: DC

## 2015-07-16 MED ORDER — GENTAMICIN SULFATE 40 MG/ML IJ SOLN
INTRAMUSCULAR | Status: DC | PRN
  Administered 2015-07-16 (×3): 80 mg

## 2015-07-16 MED ORDER — ONDANSETRON HCL 4 MG/2ML IJ SOLN
INTRAMUSCULAR | Status: AC
  Filled 2015-07-16: qty 2

## 2015-07-16 MED ORDER — HYDROCODONE-ACETAMINOPHEN 5-325 MG PO TABS: 1.0000 | ORAL_TABLET | ORAL | Status: DC | PRN

## 2015-07-16 SURGICAL SUPPLY — 61 items
ALCOHOL ISOPROPYL (RUBBING) (MISCELLANEOUS) ×3 IMPLANT
BANDAGE ELASTIC 4 VELCRO ST LF (GAUZE/BANDAGES/DRESSINGS) ×3 IMPLANT
BANDAGE ELASTIC 6 VELCRO ST LF (GAUZE/BANDAGES/DRESSINGS) ×3 IMPLANT
BANDAGE ESMARK 6X9 LF (GAUZE/BANDAGES/DRESSINGS) ×1 IMPLANT
BLADE SAW RECIP 87.9 MT (BLADE) ×3 IMPLANT
BNDG ESMARK 6X9 LF (GAUZE/BANDAGES/DRESSINGS) ×3
BNDG GAUZE ELAST 4 BULKY (GAUZE/BANDAGES/DRESSINGS) ×3 IMPLANT
CHLORAPREP W/TINT 26ML (MISCELLANEOUS) ×6 IMPLANT
COVER SURGICAL LIGHT HANDLE (MISCELLANEOUS) ×3 IMPLANT
CUFF TOURNIQUET SINGLE 34IN LL (TOURNIQUET CUFF) ×3 IMPLANT
DERMABOND ADVANCED (GAUZE/BANDAGES/DRESSINGS) ×2
DERMABOND ADVANCED .7 DNX12 (GAUZE/BANDAGES/DRESSINGS) ×1 IMPLANT
DRAIN HEMOVAC 7FR (DRAIN) ×6 IMPLANT
DRAPE SHEET LG 3/4 BI-LAMINATE (DRAPES) ×6 IMPLANT
DRAPE U-SHAPE 47X51 STRL (DRAPES) ×3 IMPLANT
DRAPE UNIVERSAL PACK (DRAPES) ×3 IMPLANT
DRSG AQUACEL AG ADV 3.5X14 (GAUZE/BANDAGES/DRESSINGS) ×3 IMPLANT
DRSG PAD ABDOMINAL 8X10 ST (GAUZE/BANDAGES/DRESSINGS) ×9 IMPLANT
DRSG TEGADERM 2-3/8X2-3/4 SM (GAUZE/BANDAGES/DRESSINGS) ×3 IMPLANT
ELECT REM PT RETURN 9FT ADLT (ELECTROSURGICAL) ×3
ELECTRODE REM PT RTRN 9FT ADLT (ELECTROSURGICAL) ×1 IMPLANT
EVACUATOR 1/8 PVC DRAIN (DRAIN) IMPLANT
GAUZE SPONGE 4X4 12PLY STRL (GAUZE/BANDAGES/DRESSINGS) ×3 IMPLANT
GLOVE BIO SURGEON STRL SZ8 (GLOVE) ×6 IMPLANT
GLOVE BIO SURGEON STRL SZ8.5 (GLOVE) ×6 IMPLANT
GLOVE BIOGEL PI IND STRL 8.5 (GLOVE) ×2 IMPLANT
GLOVE BIOGEL PI INDICATOR 8.5 (GLOVE) ×4
GOWN STRL REUS W/TWL 2XL LVL3 (GOWN DISPOSABLE) ×3 IMPLANT
HANDPIECE INTERPULSE COAX TIP (DISPOSABLE) ×2
IMMOBILIZER KNEE 20 (SOFTGOODS) ×3
IMMOBILIZER KNEE 20 THIGH 36 (SOFTGOODS) ×1 IMPLANT
KIT BASIN OR (CUSTOM PROCEDURE TRAY) ×3 IMPLANT
KIT STIMULAN RAPID CURE  10CC (Orthopedic Implant) ×2 IMPLANT
KIT STIMULAN RAPID CURE 10CC (Orthopedic Implant) ×1 IMPLANT
MANIFOLD NEPTUNE II (INSTRUMENTS) ×3 IMPLANT
NEEDLE 22X1 1/2 (OR ONLY) (NEEDLE) ×3 IMPLANT
NEEDLE SPNL 18GX3.5 QUINCKE PK (NEEDLE) ×6 IMPLANT
PACK TOTAL JOINT (CUSTOM PROCEDURE TRAY) ×3 IMPLANT
PACK TOTAL KNEE CUSTOM (KITS) ×3 IMPLANT
PADDING CAST COTTON 6X4 STRL (CAST SUPPLIES) ×3 IMPLANT
PLATE ROT INSERT 10MM SIZE 4 (Plate) ×3 IMPLANT
SAW OSC TIP CART 19.5X105X1.3 (SAW) ×3 IMPLANT
SEALER BIPOLAR AQUA 6.0 (INSTRUMENTS) ×3 IMPLANT
SET HNDPC FAN SPRY TIP SCT (DISPOSABLE) ×1 IMPLANT
SET PAD KNEE POSITIONER (MISCELLANEOUS) ×3 IMPLANT
STAPLER VISISTAT 35W (STAPLE) ×6 IMPLANT
SUT MNCRL AB 3-0 PS2 27 (SUTURE) ×3 IMPLANT
SUT MON AB 2-0 CT1 36 (SUTURE) ×6 IMPLANT
SUT PDS AB 1 CTX 36 (SUTURE) ×3 IMPLANT
SUT VIC AB 0 CT1 27 (SUTURE) ×2
SUT VIC AB 0 CT1 27XBRD ANBCTR (SUTURE) ×1 IMPLANT
SUT VIC AB 1 CT1 27 (SUTURE) ×8
SUT VIC AB 1 CT1 27XBRD ANBCTR (SUTURE) ×4 IMPLANT
SUT VIC AB 1 CTX 27 (SUTURE) ×3 IMPLANT
SUT VIC AB 2-0 CT1 27 (SUTURE) ×2
SUT VIC AB 2-0 CT1 TAPERPNT 27 (SUTURE) ×1 IMPLANT
SUT VLOC 180 0 24IN GS25 (SUTURE) ×3 IMPLANT
SYR 50ML LL SCALE MARK (SYRINGE) ×6 IMPLANT
SYRINGE 20CC LL (MISCELLANEOUS) ×3 IMPLANT
TOWER CARTRIDGE SMART MIX (DISPOSABLE) ×3 IMPLANT
WRAP KNEE MAXI GEL POST OP (GAUZE/BANDAGES/DRESSINGS) ×3 IMPLANT

## 2015-07-16 NOTE — Anesthesia Postprocedure Evaluation (Signed)
Anesthesia Post Note  Patient: CAYCEN NODAL  Procedure(s) Performed: Procedure(s) (LRB): IRRIGATION AND DEBRIDEMENT RIGHT KNEE WITH POLY EXCHANGE AND PLACEMENT OF ANTIBIOTIC BEADS (Right)  Patient location during evaluation: PACU Anesthesia Type: General Level of consciousness: awake and alert Pain management: pain level controlled Vital Signs Assessment: post-procedure vital signs reviewed and stable Respiratory status: spontaneous breathing, nonlabored ventilation, respiratory function stable and patient connected to nasal cannula oxygen Cardiovascular status: blood pressure returned to baseline and stable Postop Assessment: no signs of nausea or vomiting Anesthetic complications: no    Last Vitals:  Filed Vitals:   07/16/15 1615 07/16/15 1626  BP: 137/75 153/78  Pulse: 75 84  Temp:  36.8 C  Resp: 13 13    Last Pain:  Filed Vitals:   07/16/15 1635  PainSc: [place] Epimenio Schetter

## 2015-07-16 NOTE — Anesthesia Preprocedure Evaluation (Addendum)
Anesthesia Evaluation  Patient identified by MRN, date of birth, ID band Patient awake    Reviewed: Allergy & Precautions, H&P , Patient's Chart, lab work & pertinent test results, reviewed documented beta blocker date and time   Airway Mallampati: II  TM Distance: >3 FB Neck ROM: full    Dental no notable dental hx.    Pulmonary former smoker,    Pulmonary exam normal breath sounds clear to auscultation       Cardiovascular hypertension,  Rhythm:regular Rate:Normal     Neuro/Psych    GI/Hepatic   Endo/Other    Renal/GU      Musculoskeletal   Abdominal   Peds  Hematology   Anesthesia Other Findings Hypertension    Thyroid disease      Hemorrhoids   Hypothyroidism      Tachycardia    Depression      Anxiety    GERD    Arthritis   Hepatitis A     Lower back pain    Sleep apnea  COULD NOT TOLERATE CPAP MASK    Heart murmur     Reproductive/Obstetrics                            Anesthesia Physical Anesthesia Plan  ASA: III  Anesthesia Plan:    Post-op Pain Management:    Induction: Intravenous  Airway Management Planned: Oral ETT  Additional Equipment:   Intra-op Plan:   Post-operative Plan:   Informed Consent: I have reviewed the patients History and Physical, chart, labs and discussed the procedure including the risks, benefits and alternatives for the proposed anesthesia with the patient or authorized representative who has indicated his/her understanding and acceptance.   Dental Advisory Given and Dental advisory given  Plan Discussed with: CRNA and Surgeon  Anesthesia Plan Comments: (Discussed GA with LMA, possible sore throat, potential need to switch to ETT, N/V, pulmonary aspiration. Questions answered. )       Anesthesia Quick Evaluation

## 2015-07-16 NOTE — Interval H&P Note (Signed)
History and Physical Interval Note:  07/16/2015 1:00 PM  [name]  has presented today for surgery, with the diagnosis of Infected Right Knee  The various methods of treatment have been discussed with the patient and family. After consideration of risks, benefits and other options for treatment, the patient has consented to  Procedure(s): IRRIGATION AND DEBRIDEMENT RIGHT KNEE WITH POLY EXCHANGE (Right) as a surgical intervention .  The patient's history has been reviewed, patient examined, no change in status, stable for surgery.  I have reviewed the patient's chart and labs.  Questions were answered to the patient's satisfaction.     Juan Wiggins A

## 2015-07-16 NOTE — Progress Notes (Signed)
Pt transported to OR pre procedure area.  VSS.  Alert and oriented.

## 2015-07-16 NOTE — Brief Op Note (Signed)
07/14/2015 - 07/16/2015  3:13 PM  PATIENT:  Juan Wiggins  65 y.o. male  PRE-OPERATIVE DIAGNOSIS:  Infected Right Total Knee secondary to a Spider Bite tha led to a few abscesses on his Right Forearm.  POST-OPERATIVE DIAGNOSIS:  Same as Pre-Op  PROCEDURE:  Procedure(s): IRRIGATION AND DEBRIDEMENT RIGHT KNEE WITH POLY EXCHANGE AND PLACEMENT OF ANTIBIOTIC BEADS (Right)  SURGEON:  Surgeon(s) and Role:    * Latanya Maudlin, MD - Primary    * Rod Can, MD - Assisting  :   ASSISTANTS: Rod Can MD  ANESTHESIA:   general  EBL:  Total I/O In: 85 [P.O.:30; I.V.:500; IV Piggyback:50] Out: [id number] [Urine:450; Blood:25]  BLOOD ADMINISTERED:none  DRAINS: (One) Hemovact drain(s) in the Right Knee with  Suction Open   LOCAL MEDICATIONS USED:  Exparel 20cc mixed with 20cc of Normal Saline   SPECIMEN:  Source of Specimen:  Right Total Knee  DISPOSITION OF SPECIMEN:  Source of Specimen:  Culture of Right Total Knee.  COUNTS:  YES  TOURNIQUET:   Total Tourniquet Time Documented: Thigh (Right) - 68 minutes Total: Thigh (Right) - 68 minutes   DICTATION: .Other Dictation: Dictation Number [id number]  PLAN OF CARE: Admit to inpatient   PATIENT DISPOSITION:  PACU - hemodynamically stable.   Delay start of Pharmacological VTE agent (>24hrs) due to surgical blood loss or risk of bleeding: yes

## 2015-07-16 NOTE — Anesthesia Procedure Notes (Signed)
Procedure Name: Intubation Date/Time: 07/16/2015 1:37 PM Performed by: Jacquiline Doe A Pre-anesthesia Checklist: Patient identified, Timeout performed, Emergency Drugs available, Suction available and Patient being monitored Patient Re-evaluated:Patient Re-evaluated prior to inductionOxygen Delivery Method: Circle system utilized Preoxygenation: Pre-oxygenation with 100% oxygen Intubation Type: IV induction and Cricoid Pressure applied Ventilation: Mask ventilation without difficulty Laryngoscope Size: Mac and 4 Grade View: Grade II Tube type: Oral Tube size: 7.5 mm Number of attempts: 1 Airway Equipment and Method: Stylet Placement Confirmation: ETT inserted through vocal cords under direct vision,  breath sounds checked- equal and bilateral and positive ETCO2 Secured at: 23 cm Tube secured with: Tape Dental Injury: Teeth and Oropharynx as per pre-operative assessment

## 2015-07-16 NOTE — Progress Notes (Signed)
*  PRELIMINARY RESULTS* Echocardiogram 2D Echocardiogram has been performed.  Leavy Cella 07/16/2015, 11:41 AM

## 2015-07-16 NOTE — Transfer of Care (Signed)
Immediate Anesthesia Transfer of Care Note  Patient: Juan Wiggins  Procedure(s) Performed: Procedure(s): IRRIGATION AND DEBRIDEMENT RIGHT KNEE WITH POLY EXCHANGE AND PLACEMENT OF ANTIBIOTIC BEADS (Right)  Patient Location: PACU  Anesthesia Type:General  Level of Consciousness: awake, sedated, patient cooperative and responds to stimulation  Airway & Oxygen Therapy: Patient Spontanous Breathing and Patient connected to face mask oxygen  Post-op Assessment: Report given to RN, Post -op Vital signs reviewed and stable, Patient moving all extremities and Patient moving all extremities X 4  Post vital signs: Reviewed and stable  Last Vitals:  Filed Vitals:   07/16/15 1200 07/16/15 1522  BP: 153/84   Pulse: 78 85  Temp:  36.7 C  Resp: 16 27    Last Pain:  Filed Vitals:   07/16/15 1523  PainSc: 0-No pain      Patients Stated Pain Goal: 0 ([id number])  Complications: No apparent anesthesia complications

## 2015-07-16 NOTE — H&P (View-Only) (Signed)
Subjective: 1 Day Post-Op Procedure(s) (LRB): ARTHROSCOPY IRRIGATION AND DEBRIDEMENT - KNEE (Right) Patient reports pain as 3 on 0-10 scale. Case discussed with Dr. Onnie Graham and I will take him back to Surgery tomorrow and do an OPEN debridemet and POLY exchange of his Right Total Knee. Thus far he has a Strept  Infection which was shown on his culture. Also positive blood culture.HBg 8.0. Will Type and Cross. And transfuse with 2-units of packed RBC.  Objective: Vital signs in last 24 hours: Temp:  [98.2 F (36.8 C)-99 F (37.2 C)] 98.6 F (37 C) (05/22 1136) Pulse Rate:  [80-98] 80 (05/22 1100) Resp:  [12-25] 14 (05/22 1100) BP: (87-141)/(41-72) 117/53 mmHg (05/22 1100) SpO2:  [90 %-99 %] 95 % (05/22 1100) Weight:  [96.1 kg (211 lb 13.8 oz)] 96.1 kg (211 lb 13.8 oz) (05/22 0500)  Intake/Output from previous day: 05/21 0701 - 05/22 0700 In: 7021.9 [P.O.:540; I.V.:2831.9; IV Piggyback:3650] Out: 1992 [Urine:1805; Drains:130; Blood:57] Intake/Output this shift: Total I/O In: 830 [P.O.:480; I.V.:300; IV Piggyback:50] Out: 600 [Urine:600]   Recent Labs  07/14/15 0830 07/14/15 1832 07/15/15 0330  HGB 9.3* 9.1* 8.0*    Recent Labs  07/14/15 1832 07/15/15 0330  WBC 38.0* 23.8*  RBC 2.99* 2.64*  HCT 28.8* 25.0*  PLT 204 165    Recent Labs  07/14/15 1832 07/15/15 0330  NA 136 140  K 5.2* 5.5*  CL 108 114*  CO2 18* 22  BUN 31* 31*  CREATININE 2.51* 2.53*  GLUCOSE 112* 135*  CALCIUM 7.1* 7.4*   No results for input(s): LABPT, INR in the last 72 hours.  Dorsiflexion/Plantar flexion intact Compartment soft  Assessment/Plan: 1 Day Post-Op Procedure(s) (LRB): ARTHROSCOPY IRRIGATION AND DEBRIDEMENT - KNEE (Right) Continue ABX therapy due to Culture taken of surgical site during joint revision.Because of secondary Infection which started about a month ago when he developed abscesses on his arm. He was hospitalized ay another hospital for three weeks.Will take him back  to surgery tomorrow for an extensive debridement of his Right Knee and a POLY exchange.  Juan Wiggins A 07/15/2015, 11:57 AM

## 2015-07-16 NOTE — Progress Notes (Signed)
Orthopedic Tech Progress Note Patient Details:  Juan Wiggins [birth year] [id number] Patient already has knee immobilizer. Patient ID: CY BARRISH, male   DOB: Feb 06, 1951, 65 y.o.   MRN: [id number]   [name] 07/16/2015, 7:12 PM

## 2015-07-16 NOTE — Progress Notes (Signed)
PULMONARY / CRITICAL CARE MEDICINE   Name: Juan Wiggins MRN: [id number] DOB: 22-Jan-1951    ADMISSION DATE:  07/14/2015 CONSULTATION DATE: 07/14/15   REFERRING MD:  [facility]   CHIEF COMPLAINT:  Sepsis   SUBJECTIVE: No dyspnea or cough. No fever, chills, or sweats. Still having pain in his knee.  REVIEW OF SYSTEMS:  No nausea, emesis, or abdominal pain. No chest pain or pressure.  VITAL SIGNS: BP 157/86 mmHg  Pulse 82  Temp(Src) 98.9 F (37.2 C) (Oral)  Resp 19  Ht 5\' 11"  (1.803 m)  Wt 219 lb 2.2 oz (99.4 kg)  BMI 30.58 kg/m2  SpO2 95%  INTAKE / OUTPUT: I/O last 3 completed shifts: In: [id number] [P.O.:1200; I.V.:3600; Blood:670; IV Piggyback:650] Out: 2700 [Urine:2550; Drains:150]  PHYSICAL EXAMINATION: General: Awake. No acute distress. Presently transferring to the OR. Neuro: CN 2-12 in tact. Follows commands. Grossly nonfocal. HEENT: PERRL. No oral ulcers. No scleral icterus. Cardiac: Regular rate. No edema. No JVD. Chest: Clear bilaterally to auscultation. Normal work of breathing on room air. Abdomen:  Nontender. Nondistended. Normal bowel sounds. Integument:  Warm & dry. No rash on exposed skin. Dressing/wrap in place over right knee.   LABS:  BMET  Recent Labs Lab 07/14/15 1832 07/15/15 0330 07/16/15 0617  NA 136 140 140  K 5.2* 5.5* 4.7  CL 108 114* 113*  CO2 18* 22 21*  BUN 31* 31* 30*  CREATININE 2.51* 2.53* 2.06*  GLUCOSE 112* 135* 93    Electrolytes  Recent Labs Lab 07/14/15 1832 07/15/15 0330 07/16/15 0617  CALCIUM 7.1* 7.4* 8.0*  MG  --  1.3* 2.0  PHOS  --   --  3.2    CBC  Recent Labs Lab 07/14/15 1832 07/15/15 0330 07/16/15 0008 07/16/15 0617  WBC 38.0* 23.8*  --  9.9  HGB 9.1* 8.0* 9.6* 9.4*  HCT 28.8* 25.0* 30.6* 29.3*  PLT 204 165  --  136*    Coag's No results for input(s): APTT, INR in the last 168 hours.  Sepsis Markers  Recent Labs Lab 07/14/15 0840 07/14/15 1707 07/14/15 1847 07/15/15 0330  07/16/15 0617  LATICACIDVEN 4.02* 2.0  --   --   --   PROCALCITON  --   --  22.20 21.03 12.64    ABG No results for input(s): PHART, PCO2ART, PO2ART in the last 168 hours.  Liver Enzymes  Recent Labs Lab 07/14/15 0830 07/14/15 1832 07/15/15 0330 07/16/15 0617  AST 30 25 21   --   ALT 19 22 21   --   ALKPHOS 57 68 61  --   BILITOT 0.8 1.0 0.6  --   ALBUMIN 2.7* 2.4* 2.1* 2.1*    Cardiac Enzymes No results for input(s): TROPONINI, PROBNP in the last 168 hours.  Glucose  Recent Labs Lab 07/14/15 1248  GLUCAP 109*    Imaging No results found.   STUDIES:  HIP xray 5.21 >neg  R Knee 5/21 >>neg, prev arthroplasty  TTE 5/23 >>  MICROBIOLOGY: 5/21 BC X2:  Group C Streptococcus 5/21 UC:  Negative  5/21 R Knee Fluid:  Group C Streptococcus  ANTIBIOTICS: 5/21 Vancomycin >> 5/21 Cefepime >>  SIGNIFICANT EVENTS: 5/21 Transfer from [facility] >to [facility] ICU sepsis   LINES/TUBES: Lt Subclav CVL 5/21 >> PIV x2  ASSESSMENT / PLAN:  INFECTIOUS A:   Sepsis  Bacteremia Group C Strep Rt Knee Group C Strep Infection Recent Arm Cellulitis w/ Group A Strep  P:   Awaiting culture results  Day #3 Vancomycin & Cefepime Taper antibiotics based on sensitivities Plan for repeat blood cultures after 48 hours if he remains afebrile Procalcitonin per algorithm TTE read pending  PULMONARY A: Bilateral Opacities on Imaging - Unclear etiology.  P:   Continuous pulse ox.  CARDIOVASCULAR A:  Septic Shock - Shock has resolved.  P:  Monitor on telemetry Holding home anti-hypertensives  RENAL A:   Acute on Chronic Renal Failure - Improving. Recent critical illness requiring HD. Baseline Creatinine 1.8. Hyperkalemia - Resolved. Hypomagnesemia - Resolved.  P:   Trending electrolytes & renal function daily Replace electrolytes as indicated IVF at 100cc   GASTROINTESTINAL A:   H/O Hepatitis C  P:   NPO for surgery Carafate PO  HEMATOLOGIC A:   Anemia - No signs  of active bleeding. S/P 2u PRBC 5/22 pre-op. Leukocytosis - Secondary to sepsis.  P:  Trending cell counts daily w/ CBC SCDs Plan to start SQ heparin post-op  ENDOCRINE A:   Hypothyroid   P:   Continuing Synthroid PO Monitor blood glucose with daily labs  NEUROLOGIC A:   H/O Depression  H/O Anxiety Chronic Pain/Arthritis ETOH /polysubstance use  P:   Symbyax qhs Bentyl bid  Monitor for EtOH withdrawal Xanax qhs prn Dilaudid IV prn Percocet PO prn  FAMILY  - Updates: Family updated 5/23 by Dr. Ashok Cordia.  - Inter-disciplinary family meet or Palliative Care meeting due by:  5/28  TODAY'S SUMMARY:  65 yo male was in hospital at in [place] during April 2017 for Rt wrist cellulitis after tick bite.  He had Group A strep.  This was complicated by pneumonia and AKI requiring temporary dialysis.  He had dialysis catheter removed.  Patient now has Strep bacteremia and knee infection as well. Patient going to OR today. May be able to transition out of the ICU post-op if he is stable.  [name] Ashok Cordia, M.D. [facility] Pager:  [phone] After 3pm or if no response, call [phone] 12:48 PM 07/16/2015

## 2015-07-17 ENCOUNTER — Encounter (HOSPITAL_COMMUNITY): Payer: Self-pay | Admitting: Orthopedic Surgery

## 2015-07-17 DIAGNOSIS — A403 Sepsis due to Streptococcus pneumoniae: Secondary | ICD-10-CM

## 2015-07-17 DIAGNOSIS — Z96651 Presence of right artificial knee joint: Secondary | ICD-10-CM

## 2015-07-17 DIAGNOSIS — R509 Fever, unspecified: Secondary | ICD-10-CM

## 2015-07-17 LAB — BASIC METABOLIC PANEL
Anion gap: 7 (ref 5–15)
BUN: 30 mg/dL — ABNORMAL HIGH (ref 6–20)
CALCIUM: 9 mg/dL (ref 8.9–10.3)
CO2: 21 mmol/L — AB (ref 22–32)
CREATININE: 1.95 mg/dL — AB (ref 0.61–1.24)
Chloride: 111 mmol/L (ref 101–111)
GFR calc Af Amer: 40 mL/min — ABNORMAL LOW (ref >60)
GFR calc non Af Amer: 35 mL/min — ABNORMAL LOW (ref >60)
GLUCOSE: 133 mg/dL — AB (ref 65–99)
Potassium: 5.3 mmol/L — ABNORMAL HIGH (ref 3.5–5.1)
Sodium: 139 mmol/L (ref 135–145)

## 2015-07-17 LAB — RENAL FUNCTION PANEL
Albumin: 2.2 g/dL — ABNORMAL LOW (ref 3.5–5.0)
Anion gap: 7 (ref 5–15)
BUN: 26 mg/dL — AB (ref 6–20)
CHLORIDE: 113 mmol/L — AB (ref 101–111)
CO2: 18 mmol/L — AB (ref 22–32)
Calcium: 8.9 mg/dL (ref 8.9–10.3)
Creatinine, Ser: 1.67 mg/dL — ABNORMAL HIGH (ref 0.61–1.24)
GFR calc Af Amer: 49 mL/min — ABNORMAL LOW (ref >60)
GFR, EST NON AFRICAN AMERICAN: 42 mL/min — AB (ref >60)
Glucose, Bld: 134 mg/dL — ABNORMAL HIGH (ref 65–99)
POTASSIUM: 6 mmol/L — AB (ref 3.5–5.1)
Phosphorus: 2.7 mg/dL (ref 2.5–4.6)
Sodium: 138 mmol/L (ref 135–145)

## 2015-07-17 LAB — CBC WITH DIFFERENTIAL/PLATELET
Basophils Absolute: 0 10*3/uL (ref 0.0–0.1)
Basophils Relative: 0 %
EOS PCT: 0 %
Eosinophils Absolute: 0 10*3/uL (ref 0.0–0.7)
HCT: 30.3 % — ABNORMAL LOW (ref 39.0–52.0)
HEMOGLOBIN: 9.7 g/dL — AB (ref 13.0–17.0)
LYMPHS ABS: 1.1 10*3/uL (ref 0.7–4.0)
LYMPHS PCT: 10 %
MCH: 29.6 pg (ref 26.0–34.0)
MCHC: 32 g/dL (ref 30.0–36.0)
MCV: 92.4 fL (ref 78.0–100.0)
MONOS PCT: 3 %
Monocytes Absolute: 0.4 10*3/uL (ref 0.1–1.0)
NEUTROS PCT: 87 %
Neutro Abs: 9.9 10*3/uL — ABNORMAL HIGH (ref 1.7–7.7)
Platelets: 130 10*3/uL — ABNORMAL LOW (ref 150–400)
RBC: 3.28 MIL/uL — AB (ref 4.22–5.81)
RDW: 17.2 % — ABNORMAL HIGH (ref 11.5–15.5)
WBC: 11.3 10*3/uL — AB (ref 4.0–10.5)

## 2015-07-17 LAB — CULTURE, BLOOD (ROUTINE X 2)

## 2015-07-17 LAB — MAGNESIUM: Magnesium: 1.8 mg/dL (ref 1.7–2.4)

## 2015-07-17 MED ORDER — SODIUM CHLORIDE 0.9 % IV SOLN
2.0000 g | INTRAVENOUS | Status: DC
  Administered 2015-07-17 – 2015-07-20 (×17): 2 g via INTRAVENOUS
  Filled 2015-07-17 (×23): qty 2000

## 2015-07-17 MED ORDER — FUROSEMIDE 10 MG/ML IJ SOLN
20.0000 mg | Freq: Once | INTRAMUSCULAR | Status: AC
  Administered 2015-07-17: 20 mg via INTRAVENOUS
  Filled 2015-07-17: qty 2

## 2015-07-17 NOTE — Progress Notes (Signed)
PULMONARY / CRITICAL CARE MEDICINE   Name: Juan Wiggins MRN: [id number] DOB: June 04, 1950    ADMISSION DATE:  07/14/2015 CONSULTATION DATE: 07/14/15   REFERRING MD:  [facility]   CHIEF COMPLAINT:  Sepsis   SUBJECTIVE:  Pt reports significant relief of knee pain after wash out.  Denies acute complaints.  9L+ for admit, K elevated (6.0), afebrile, mild elevation of BP's   VITAL SIGNS: BP 161/95 mmHg  Pulse 87  Temp(Src) 98.2 F (36.8 C) (Oral)  Resp 14  Ht 5\' 11"  (1.803 m)  Wt 219 lb 2.2 oz (99.4 kg)  BMI 30.58 kg/m2  SpO2 98%  INTAKE / OUTPUT: I/O last 3 completed shifts: In: [id number] [P.O.:180; I.V.:2800; Blood:335; IV Piggyback:300] Out: [id number] [Urine:1325; Drains:85; Blood:25]  PHYSICAL EXAMINATION: General: well developed adult male in NAD, lying in bed  Neuro: CN 2-12 intact. Follows commands. Grossly nonfocal. HEENT: PERRL. No oral ulcers. No scleral icterus. Cardiac: Regular rate. No edema. No JVD. Chest: Clear bilaterally to auscultation. Normal work of breathing on 2L O2 Abdomen:  Nontender. Nondistended. Normal bowel sounds. Integument:  Warm & dry. No rash on exposed skin. RLE brace with wrap.   LABS:  BMET  Recent Labs Lab 07/15/15 0330 07/16/15 0617 07/17/15 0629  NA 140 140 138  K 5.5* 4.7 6.0*  CL 114* 113* 113*  CO2 22 21* 18*  BUN 31* 30* 26*  CREATININE 2.53* 2.06* 1.67*  GLUCOSE 135* 93 134*    Electrolytes  Recent Labs Lab 07/15/15 0330 07/16/15 0617 07/17/15 0629  CALCIUM 7.4* 8.0* 8.9  MG 1.3* 2.0 1.8  PHOS  --  3.2 2.7    CBC  Recent Labs Lab 07/15/15 0330 07/16/15 0008 07/16/15 0617 07/17/15 0629  WBC 23.8*  --  9.9 11.3*  HGB 8.0* 9.6* 9.4* 9.7*  HCT 25.0* 30.6* 29.3* 30.3*  PLT 165  --  136* 130*    Coag's No results for input(s): APTT, INR in the last 168 hours.  Sepsis Markers  Recent Labs Lab 07/14/15 0840 07/14/15 1707 07/14/15 1847 07/15/15 0330 07/16/15 0617  LATICACIDVEN 4.02* 2.0  --   --    --   PROCALCITON  --   --  22.20 21.03 12.64    ABG No results for input(s): PHART, PCO2ART, PO2ART in the last 168 hours.  Liver Enzymes  Recent Labs Lab 07/14/15 0830 07/14/15 1832 07/15/15 0330 07/16/15 0617 07/17/15 0629  AST 30 25 21   --   --   ALT 19 22 21   --   --   ALKPHOS 57 68 61  --   --   BILITOT 0.8 1.0 0.6  --   --   ALBUMIN 2.7* 2.4* 2.1* 2.1* 2.2*    Cardiac Enzymes No results for input(s): TROPONINI, PROBNP in the last 168 hours.  Glucose  Recent Labs Lab 07/14/15 1248  GLUCAP 109*    Imaging No results found.   STUDIES:  HIP xray 5.21 >neg  R Knee 5/21 >>neg, prev arthroplasty  TTE 5/23 >> LV nml, LVEF 60-65%, PA Peak 57, mildly calcified annulus  MICROBIOLOGY: 5/21 BC X2:  Group C Streptococcus 5/21 UC:  Negative  5/21 R Knee Fluid:  Group C Streptococcus 5/23 R Knee Fluid >>  5/24 BCx2 >>   ANTIBIOTICS: 5/21 Vancomycin >> 5/21 Cefepime >>  SIGNIFICANT EVENTS: 5/21 Transfer from [facility], to [facility] ICU with sepsis  5/22  Knee aspiration  5/23  To OR for washout of R knee  LINES/TUBES: Lt Subclavian CVL 5/21 >> 5/23  ASSESSMENT / PLAN:  INFECTIOUS A:   Sepsis  Bacteremia Group C Strep Rt Knee Group C Strep Infection Recent Arm Cellulitis w/ Group A Strep P:   Culture results as above Day #4 Vancomycin & Cefepime Taper antibiotics based on sensitivities Repeat BC in am 5/25 with am lab draw  Procalcitonin per algorithm TTE as above   PULMONARY A: Bilateral Opacities on Imaging - Unclear etiology P:   Continuous pulse ox. Pulmonary hygiene - IS, mobilize as able  CARDIOVASCULAR A:  Septic Shock - shock has resolved P:  Monitor on telemetry Holding home anti-hypertensives - lisinopril, propranolol   RENAL A:   Acute on Chronic Renal Failure - Improving. Recent critical illness requiring HD. Baseline Creatinine 1.8. Hyperkalemia  Hypomagnesemia - Resolved P:   Trending electrolytes & renal function daily Lasix  20 mg IV x1 5/24 for hyperkalemia Replace electrolytes as indicated Reduce IVF to 50 ml/hr  GASTROINTESTINAL A:   H/O Hepatitis C P:   Diet as tolerated  Carafate PO  HEMATOLOGIC A:   Anemia - No signs of active bleeding. S/P 2u PRBC 5/22 pre-op. Leukocytosis - Secondary to sepsis. P:  Trending cell counts daily w/ CBC SCDs Consider heparin > has "allergy" with low platelets  ENDOCRINE A:   Hypothyroid  P:   Continuing Synthroid PO Monitor blood glucose with daily labs  NEUROLOGIC A:   H/O Depression  H/O Anxiety Chronic Pain/Arthritis ETOH /polysubstance use P:   Symbyax qhs Bentyl bid  Monitor for EtOH withdrawal Xanax qhs prn Dilaudid IV prn Percocet PO prn  FAMILY  - Updates: Patient updated 5/24 at bedside.   - Inter-disciplinary family meet or Palliative Care meeting due by:  5/28  TODAY'S SUMMARY:  65 yo male was in hospital at in [place] during April 2017 for Rt wrist cellulitis after tick bite.  He had Group A strep.  This was complicated by pneumonia and AKI requiring temporary dialysis.  He had dialysis catheter removed.  Patient now has Strep bacteremia and knee infection as well. Patient going to OR today. May be able to transition out of the ICU post-op if he is stable.   Noe Gens, NP-C [facility] Pgr: [phone] or if no answer [phone] 07/17/2015, 9:14 AM   STAFF NOTE: I, Merrie Roof, MD FACP have personally reviewed patient's available data, including medical history, events of note, physical examination and test results as part of my evaluation. I have discussed with resident/NP and other care providers such as pharmacist, RN and RRT. In addition, I personally evaluated patient and elicited key findings of: no distress, just finished walking, knee immobilized, does NOT apeare toxic, no sig edema on examination, pcxr though could be some edema, but ot on exam and similar appearance in past pcxr, no further lasix  neede after todays treatment for K, follow up k , will kvo, needs ID conuslt for duration and monotherpay agent decisions, to triad  Lavon Paganini. Titus Mould, MD, [place] Pgr: [facility] 07/17/2015 11:15 AM

## 2015-07-17 NOTE — Progress Notes (Signed)
Subjective: 1 Day Post-Op Procedure(s) (LRB): IRRIGATION AND DEBRIDEMENT RIGHT KNEE WITH POLY EXCHANGE AND PLACEMENT OF ANTIBIOTIC BEADS (Right) Patient reports pain as 2 on 0-10 scale.  Doing so much better today. Able to do a straight leg raising without pain. Will pull Hemovac tomorrow. He is afebrile and his HBg is 9.7.BUN and Creatinine still elevated. Awaiting NEW cultures from surger yesterday.  Objective: Vital signs in last 24 hours: Temp:  [97.8 F (36.6 C)-98.9 F (37.2 C)] 98.2 F (36.8 C) (05/24 0422) Pulse Rate:  [62-90] 87 (05/24 0422) Resp:  [11-27] 14 (05/24 0422) BP: (121-161)/(63-97) 161/95 mmHg (05/24 0422) SpO2:  [90 %-98 %] 98 % (05/24 0422)  Intake/Output from previous day: 05/23 0701 - 05/24 0700 In: 1830 [P.O.:180; I.V.:1600; IV Piggyback:50] Out: 1130 [Urine:1050; Drains:55; Blood:25] Intake/Output this shift:     Recent Labs  07/14/15 1832 07/15/15 0330 07/16/15 0008 07/16/15 0617 07/17/15 0629  HGB 9.1* 8.0* 9.6* 9.4* 9.7*    Recent Labs  07/16/15 0617 07/17/15 0629  WBC 9.9 11.3*  RBC 3.22* 3.28*  HCT 29.3* 30.3*  PLT 136* 130*    Recent Labs  07/15/15 0330 07/16/15 0617  NA 140 140  K 5.5* 4.7  CL 114* 113*  CO2 22 21*  BUN 31* 30*  CREATININE 2.53* 2.06*  GLUCOSE 135* 93  CALCIUM 7.4* 8.0*   No results for input(s): LABPT, INR in the last 72 hours.  Dorsiflexion/Plantar flexion intact Compartment soft  Assessment/Plan: 1 Day Post-Op Procedure(s) (LRB): IRRIGATION AND DEBRIDEMENT RIGHT KNEE WITH POLY EXCHANGE AND PLACEMENT OF ANTIBIOTIC BEADS (Right) Up with therapy. Will pull hemovac tomorrow and ambulate then.May be up in chair.  Subrina Vecchiarelli A 07/17/2015, 7:20 AM

## 2015-07-17 NOTE — Consult Note (Signed)
[facility]ious [facility]  Total days of antibiotics 4        Day 4 cefepime/vanco               Reason for Consult: sepsis from strep group C    Referring Physician: gioffre  Active Problems:   Sepsis ([place])   History of total knee arthroplasty   Fever    HPI: Juan Wiggins is a 65 y.o. male with HTN, hypothyroidism, chronic hep C, substance abuse, hx of bilateral TKA in 2015 who presented to the ED on 5/21 with new onset right knee pain and swelling x 1 day  to his prosthetic joint placed by dr. Gladstone Lighter in 2015. He was found to be febrile up to 100.6, hemodynamically unstable with sBP 70s, quickly fluid rescucitated but also requiring vasopressors. He work up revealed, leukocytosis of 40K, sCr 2.5, LA 4.0 and procalcitonin of 22. CXr possibly multifocal pneumonia. He was started on vancomycin and cefepime for empiric coverage. Orthopedics took him to OR for arthroscopic knee lavage nad extensivesynevectomy on 5/21. His infectious work up revealed blood cx + streptococcal group C., which was also identified in his synovial fluid from 5/21. On 5/23,he went back to the OR for I x D, polyexchange nad abtx bead placement, original HW still in place. Leukocytosis resolving. AKI improving  Interestingly, he was admitted at [facility] from 4/12-4/19 for severe sepsis due to group A strep cellulitis to right arm s/p i x d. Also had aki, pulmonary edema,  intubated and had intermittent HD. i have reviewed our records as well as his outside [facility] admission records from april  Past Medical History  Diagnosis Date  . Hypertension   . Thyroid disease   . Hemorrhoids   . Hypothyroidism   . Tachycardia     PT STATES HIS HEART RATE USUALLY 100 OR MORE  . Depression   . Anxiety   . GERD (gastroesophageal reflux disease)     PREVACID IF NEEDED  . Arthritis     RHEUMATOID ARTHRITIS; OA LEFT KNEE  . 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 Lower  back pain     TOLD SCIATIC NERVE PINCHED - MAY NEED SURGERY IN FUTURE  . Sleep apnea     CLAUSTROPHOBIC - COULD NOT TOLERATE CPAP MASK  . Heart murmur   . Pancreatitis, alcoholic, acute   . Cancer ([place])     MELANOMA REMOVED RT SHOULDER    Allergies:  Allergies  Allergen Reactions  . Heparin Other (See Comments)    Low platelets.     MEDICATIONS: . ampicillin (OMNIPEN) IV  2 g Intravenous Q4H  . antiseptic Juan rinse  7 mL Mouth Rinse q12n4p  . bupivacaine liposome  20 mL Infiltration Once  . chlorhexidine  15 mL Mouth Rinse BID  . cholecalciferol  3,000 Units Juan Daily  . dicyclomine  20 mg Juan BID  . ferrous sulfate  325 mg Juan TID PC  . levothyroxine  175 mcg Juan QAC breakfast  . OLANZapine-FLUoxetine  2 capsule Juan QHS  . sucralfate  1 g Juan TID WC & HS    Social History  Substance Use Topics  . Smoking status: Former Smoker -- 1.50 packs/day for 35 years    Types: Cigars    Quit date: 02/22/1998  . Smokeless tobacco: Never Used     Comment: used cigarettes 20 years ago  . Alcohol  Use: No     Comment: " last alcohol use was in December 2015."    Family History  Problem Relation Age of Onset  . Hypertension Mother   . Other Mother     varicose veins  . Hypertension Sister      Review of Systems (positives in bold) Constitutional: fever, chills, diaphoresis, negative for activity change, appetite change, fatigue and unexpected weight change.  HENT: Negative for congestion, sore throat, rhinorrhea, sneezing, trouble swallowing and sinus pressure.  Eyes: Negative for photophobia and visual disturbance.  Respiratory: Negative for cough, chest tightness, shortness of breath, wheezing and stridor.  Cardiovascular: Negative for chest pain, palpitations and leg swelling.  Gastrointestinal: Negative for nausea, vomiting, abdominal pain, diarrhea, constipation, blood in stool, abdominal distention and anal bleeding.  Genitourinary: Negative for dysuria,  hematuria, flank pain and difficulty urinating.  Musculoskeletal: right knee pain Negative for myalgias, back pain, joint swelling, arthralgias and gait problem.  Skin: Negative for color change, pallor, rash and wound.  Neurological: Negative for dizziness, tremors, weakness and light-headedness. Memory loss for hospitalization Hematological: Negative for adenopathy. Does not bruise/bleed easily.  Psychiatric/Behavioral: Negative for behavioral problems, confusion, sleep disturbance, dysphoric mood, decreased concentration and agitation.     OBJECTIVE: Temp:  [97.8 F (36.6 C)-99.3 F (37.4 C)] 99.3 F (37.4 C) (05/24 1520) Pulse Rate:  [62-90] 90 (05/24 1520) Resp:  [11-18] 16 (05/24 1520) BP: (121-161)/(78-97) 137/94 mmHg (05/24 1520) SpO2:  [90 %-98 %] 93 % (05/24 1520) Physical Exam  Constitutional: He is oriented to person, place, and time. He appears well-developed and well-nourished. No distress.  HENT:  Mouth/Throat: Oropharynx is clear and moist. No oropharyngeal exudate.  Cardiovascular: Normal rate, regular rhythm and normal heart sounds. Exam reveals no gallop and no friction rub.  No murmur heard.  Pulmonary/Chest: Effort normal and breath sounds normal. No respiratory distress. He has no wheezes.  Abdominal: Soft. Bowel sounds are normal. He exhibits no distension. There is no tenderness.  Lymphadenopathy:  He has no cervical adenopathy.  Neurological: He is alert and oriented to person, place, and time.  Ext: right leg is wrapped from surgery,  Skin: Skin is warm and dry. No rash noted. No erythema.  Psychiatric: He has a normal mood and affect. His behavior is normal.    LABS: Results for orders placed or performed during the hospital encounter of 07/14/15 (from the past 48 hour(s))  Hemoglobin and hematocrit, blood     Status: Abnormal   Collection Time: 07/16/15 12:08 AM  Result Value Ref Range   Hemoglobin 9.6 (L) 13.0 - 17.0 g/dL   HCT 30.6 (L) 39.0 - 52.0  %  Procalcitonin     Status: None   Collection Time: 07/16/15  6:17 AM  Result Value Ref Range   Procalcitonin 12.64 ng/mL    Comment:        Interpretation: PCT >= 10 ng/mL: Important systemic inflammatory response, almost exclusively due to severe bacterial sepsis or septic shock. (NOTE)         ICU PCT Algorithm               Non ICU PCT Algorithm    ----------------------------     ------------------------------         PCT < 0.25 ng/mL                 PCT < 0.1 ng/mL     Stopping of antibiotics  Stopping of antibiotics       strongly encouraged.               strongly encouraged.    ----------------------------     ------------------------------       PCT level decrease by               PCT < 0.25 ng/mL       >= 80% from peak PCT       OR PCT 0.25 - 0.5 ng/mL          Stopping of antibiotics                                             encouraged.     Stopping of antibiotics           encouraged.    ----------------------------     ------------------------------       PCT level decrease by              PCT >= 0.25 ng/mL       < 80% from peak PCT        AND PCT >= 0.5 ng/mL             Continuing antibiotics                                              encouraged.       Continuing antibiotics            encouraged.    ----------------------------     ------------------------------     PCT level increase compared          PCT > 0.5 ng/mL         with peak PCT AND          PCT >= 0.5 ng/mL             Escalation of antibiotics                                          strongly encouraged.      Escalation of antibiotics        strongly encouraged.   Magnesium     Status: None   Collection Time: 07/16/15  6:17 AM  Result Value Ref Range   Magnesium 2.0 1.7 - 2.4 mg/dL  CBC with Differential/Platelet     Status: Abnormal   Collection Time: 07/16/15  6:17 AM  Result Value Ref Range   WBC 9.9 4.0 - 10.5 K/uL   RBC 3.22 (L) 4.22 - 5.81 MIL/uL   Hemoglobin 9.4 (L)  13.0 - 17.0 g/dL   HCT 29.3 (L) 39.0 - 52.0 %   MCV 91.0 78.0 - 100.0 fL   MCH 29.2 26.0 - 34.0 pg   MCHC 32.1 30.0 - 36.0 g/dL   RDW 18.1 (H) 11.5 - 15.5 %   Platelets 136 (L) 150 - 400 K/uL   Neutrophils Relative % 77 %   Neutro Abs 7.7 1.7 - 7.7 K/uL   Lymphocytes Relative 16 %   Lymphs Abs 1.6 0.7 - 4.0 K/uL   Monocytes Relative 4 %  Monocytes Absolute 0.4 0.1 - 1.0 K/uL   Eosinophils Relative 3 %   Eosinophils Absolute 0.3 0.0 - 0.7 K/uL   Basophils Relative 0 %   Basophils Absolute 0.0 0.0 - 0.1 K/uL  Renal function panel     Status: Abnormal   Collection Time: 07/16/15  6:17 AM  Result Value Ref Range   Sodium 140 135 - 145 mmol/L   Potassium 4.7 3.5 - 5.1 mmol/L   Chloride 113 (H) 101 - 111 mmol/L   CO2 21 (L) 22 - 32 mmol/L   Glucose, Bld 93 65 - 99 mg/dL   BUN 30 (H) 6 - 20 mg/dL   Creatinine, Ser 2.06 (H) 0.61 - 1.24 mg/dL   Calcium 8.0 (L) 8.9 - 10.3 mg/dL   Phosphorus 3.2 2.5 - 4.6 mg/dL   Albumin 2.1 (L) 3.5 - 5.0 g/dL   GFR calc non Af Amer 33 (L) >60 mL/min   GFR calc Af Amer 38 (L) >60 mL/min    Comment: (NOTE) The eGFR has been calculated using the CKD EPI equation. This calculation has not been validated in all clinical situations. eGFR's persistently <60 mL/min signify possible Chronic Kidney Disease.    Anion gap 6 5 - 15  Urine rapid drug screen (hosp performed)     Status: Abnormal   Collection Time: 07/16/15  9:15 AM  Result Value Ref Range   Opiates POSITIVE (A) NONE DETECTED   Cocaine POSITIVE (A) NONE DETECTED   Benzodiazepines POSITIVE (A) NONE DETECTED   Amphetamines NONE DETECTED NONE DETECTED   Tetrahydrocannabinol NONE DETECTED NONE DETECTED   Barbiturates NONE DETECTED NONE DETECTED    Comment:        DRUG SCREEN FOR MEDICAL PURPOSES ONLY.  IF CONFIRMATION IS NEEDED FOR ANY PURPOSE, NOTIFY LAB WITHIN 5 DAYS.        LOWEST DETECTABLE LIMITS FOR URINE DRUG SCREEN Drug Class       Cutoff (ng/mL) Amphetamine      1000 Barbiturate       200 Benzodiazepine   751 Tricyclics       700 Opiates          300 Cocaine          300 THC              50   Body fluid culture     Status: None (Preliminary result)   Collection Time: 07/16/15  2:35 PM  Result Value Ref Range   Specimen Description FLUID SYNOVIAL RIGHT KNEE    Special Requests SWAB PT ON VANC    Gram Stain      MODERATE WBC PRESENT,BOTH PMN AND MONONUCLEAR NO ORGANISMS SEEN    Culture TOO YOUNG TO READ    Report Status PENDING   Renal function panel     Status: Abnormal   Collection Time: 07/17/15  6:29 AM  Result Value Ref Range   Sodium 138 135 - 145 mmol/L   Potassium 6.0 (H) 3.5 - 5.1 mmol/L    Comment: DELTA CHECK NOTED NO VISIBLE HEMOLYSIS    Chloride 113 (H) 101 - 111 mmol/L   CO2 18 (L) 22 - 32 mmol/L   Glucose, Bld 134 (H) 65 - 99 mg/dL   BUN 26 (H) 6 - 20 mg/dL   Creatinine, Ser 1.67 (H) 0.61 - 1.24 mg/dL   Calcium 8.9 8.9 - 10.3 mg/dL   Phosphorus 2.7 2.5 - 4.6 mg/dL   Albumin 2.2 (L) 3.5 - 5.0 g/dL  GFR calc non Af Amer 42 (L) >60 mL/min   GFR calc Af Amer 49 (L) >60 mL/min    Comment: (NOTE) The eGFR has been calculated using the CKD EPI equation. This calculation has not been validated in all clinical situations. eGFR's persistently <60 mL/min signify possible Chronic Kidney Disease.    Anion gap 7 5 - 15  CBC with Differential/Platelet     Status: Abnormal   Collection Time: 07/17/15  6:29 AM  Result Value Ref Range   WBC 11.3 (H) 4.0 - 10.5 K/uL   RBC 3.28 (L) 4.22 - 5.81 MIL/uL   Hemoglobin 9.7 (L) 13.0 - 17.0 g/dL   HCT 30.3 (L) 39.0 - 52.0 %   MCV 92.4 78.0 - 100.0 fL   MCH 29.6 26.0 - 34.0 pg   MCHC 32.0 30.0 - 36.0 g/dL   RDW 17.2 (H) 11.5 - 15.5 %   Platelets 130 (L) 150 - 400 K/uL   Neutrophils Relative % 87 %   Neutro Abs 9.9 (H) 1.7 - 7.7 K/uL   Lymphocytes Relative 10 %   Lymphs Abs 1.1 0.7 - 4.0 K/uL   Monocytes Relative 3 %   Monocytes Absolute 0.4 0.1 - 1.0 K/uL   Eosinophils Relative 0 %   Eosinophils  Absolute 0.0 0.0 - 0.7 K/uL   Basophils Relative 0 %   Basophils Absolute 0.0 0.0 - 0.1 K/uL  Magnesium     Status: None   Collection Time: 07/17/15  6:29 AM  Result Value Ref Range   Magnesium 1.8 1.7 - 2.4 mg/dL    MICRO: 5/21 blood cx strep group c 5/21 synovial fluid cx strep group c 5/23 knee tissue cx gpc chains IMAGING: 5/21 cxr patchy hazy opacities throughout the bilateral parahilar lungs, 5/23 TTE negative for veg  HISTORICAL MICRO/IMAGING  Assessment/Plan:  65yo M with severe sepsis due to streptococcal infection (bacteremia and right knee PJI) c/b aki  Streptococcal infection = will change his abtx to ampicillin. Can d/c cefepime and vancomycin  Streptococcal bacteremia = will repeat blood culture to ensure clearance, TTE did not suggest vegetation  Streptococcal PJI = recommend to start rifampin 355m BID tomorrow to cover biofilm associated with streptococcal infections with retained HW in pji infections. Plan to treat for 6 wk but wait for picc line placement until further aki improvement and documentation of bacteremia clearance  Acute sepsis = it is interesting that he has similar presentation with group A strep. Wondering if he has streptococcal toxic shock syndrome, a month a part. He may have immune defect in response to infections vs. His hep C/possible cirrhosis plays a role on how infections present for him. Will check immunoglobulins  Chronic hep c = will check hep C viral load, diagnosed 2 years ago, untreated. and hiv

## 2015-07-17 NOTE — Progress Notes (Signed)
Pharmacy Antibiotic Note  Juan Wiggins is a 65 y.o. male admitted on 07/14/2015 with pneumonia. Transfer from [facility], to [facility] ICU with sepsis.  Pharmacy was consulted for Vancomycin and Cefepime dosing. Vanc and cefepime continue for Bacteremia Group C Strep Acute renal failure with HD during critical illness.  SCr 2.51>2.53>2.06>1.67k ,improving, CrCl ~ 50 ml/min WBC down from 40k  to 11.3K today CrCl 30-35 ml/min Afebrile  5/22 Knee aspiration  5/23 To OR for washout of R knee   CCM plans to Taper antibiotics based on sensitivities and repeat BC in am 5/25 with am lab draw .  Plan: Continue Vancomycin 1250mg  IV q24 hr Continue cefepime 1g IV q24 hr F/U cultures, watch renal fxn, and trend clinical status Check vancomycin trough in AM   Height: 5\' 11"  (180.3 cm) Weight: 219 lb 2.2 oz (99.4 kg) IBW/kg (Calculated) : 75.3  Temp (24hrs), Avg:98.2 F (36.8 C), Min:97.8 F (36.6 C), Max:98.8 F (37.1 C)   Recent Labs Lab 07/14/15 0830 07/14/15 0840 07/14/15 1707 07/14/15 1832 07/15/15 0330 07/16/15 0617 07/17/15 0629  WBC 40.6*  --   --  38.0* 23.8* 9.9 11.3*  CREATININE 2.51*  --   --  2.51* 2.53* 2.06* 1.67*  LATICACIDVEN  --  4.02* 2.0  --   --   --   --     Estimated Creatinine Clearance: 54.4 mL/min (by C-G formula based on Cr of 1.67).    Allergies  Allergen Reactions  . Heparin Other (See Comments)    Low platelets.     Antimicrobials this admission: 5/21 Vancomycin>> 5/21 Cefepime >>  Microbiology results: 5/21 blood x2>> 2/2 group c strep  5/21 urine >>neg/F  5/21 fluid, r knee >>  group c strep (sens to amp, Ceftriaxone, levofoxacin, vanc.  Resistant to clindamycin, erythromycin)  5/23 right knee synovial fluid:  Too young to read  Thank you for allowing pharmacy to be a part of this patient's care.   Gracy Bruins, PharmD Clinical Pharmacist [facility]

## 2015-07-17 NOTE — Care Management Note (Signed)
Case Management Note  Patient Details  Name: ETON SENTZ MRN: [id number] Date of Birth: 1951-02-23  Subjective/Objective:              S/p I and D right knee with polyexchange, placement of antibiotics beads      Action/Plan: Spoke with patient and his sister about discharge plan. Gave choice for home health, he selected [facility] which he worked with in the [employer] with [employer] and set up [facility]. Patient stated that he has a rolling walker, a wheelchair and crutches at home, no equipment needs identified. Patient stated that he will have his dad and his dad's caregiver to assist him after discharge.        Expected Discharge Date:                  Expected Discharge Plan:     In-House Referral:     Discharge planning Services     Post Acute Care Choice:    Choice offered to:     DME Arranged:    DME Agency:     HH Arranged:    [place] Agency:     Status of Service:     Medicare Important Message Given:    Date Medicare IM Given:    Medicare IM give by:    Date Additional Medicare IM Given:    Additional Medicare Important Message give by:     If discussed at Long of Stay Meetings, dates discussed:    Additional Comments:  Nila Nephew, RN 07/17/2015, 2:49 PM

## 2015-07-17 NOTE — Op Note (Signed)
NAME:  Juan Wiggins, Juan Wiggins NO.:  [id number]  MEDICAL RECORD NO.:  [id number]  LOCATION:  2M12C                        FACILITY:  [facility]  PHYSICIAN:  Kipp Brood. [place] Wainwright, M.D.DATE OF BIRTH:  10/04/1950  DATE OF PROCEDURE:  07/16/2015 DATE OF DISCHARGE:                              OPERATIVE REPORT   SURGEON:  Kipp Brood. Gladstone Lighter, M.D.  ASSISTANT:  Rod Can, MD  PREOPERATIVE DIAGNOSIS:  Infected right total knee.  LITTLE HISTORY:  I did a right total knee arthroplasty on him about a year and a half ago.  About 4-5 weeks ago, he sustained a recluse spider bite to his right forearm and then turned into an abscess and then he developed a second abscess to the right forearm.  As a result of that, he went to the Urgent Care.  His blood pressure was 58 systolic, so he was referred to the hospital where he was admitted to [facility] and basically had a septicemia.  He developed renal failure, was placed on dialysis.  After approximately 3 weeks in the ICU at [facility], he was released.  Then about 2 weeks later, he developed pain in his right knee, went to the ER at [facility], he was seen by my associate, Dr. Onnie Graham, who took him to Surgery and did an arthroscopic clean out of the right knee.  I saw him on consultation the following day, on Monday since he was my patient and elected to take him back to Surgery today, which is 48 hours later to do an open debridement and poly exchange.  OPERATIONS: 1. Open arthrotomy, right knee. 2. Extensive synovectomy, right knee. 3. Removal of the previous polyethylene insert and replacement with     the same size insert, which is a size 4 10-mm thickness.  Also,     antibiotic beads were placed into the recesses.  DESCRIPTION OF PROCEDURE:  Under general anesthesia, routine orthopedic prep and draping of the right lower extremity was carried out.  The appropriate time-out was carried out.  I also marked the appropriate right leg  in the holding area.  At this time, I removed his Hemovac that Dr. Onnie Graham had in, I did an initial prep and then a sterile prep.  At this time, the leg was exsanguinated with an an Esmarch, tourniquet was elevated to 325 mmHg.  The incision then was made over the anterior aspect of the right knee through the old incision site.  Bleeders were identified and cauterized.  Following that, a median parapatellar incision was carried out.  Then, the patella was subluxed laterally and I did an extensive synovectomy.  Fortunately, there was no purulent material in the knee.  His initial blood culture shows Streptococcus. At this time, after doing the extensive synovectomy, I then removed the polyethylene rotating platform liner and then began our irrigation.  We did an extensive irrigation of the knee followed by antibiotic irrigation.  The knee was inspected for various pockets.  There were no pockets of pus present.  After thoroughly irrigating out the knee, a Hemovac drain was inserted and the knee was closed in layers in the usual fashion, after I inserted the antibiotic  beads, which consisted of vancomycin and gentamicin.  At this particular time, the skin was closed with metal staples and sterile dressings were applied. He will be returned to the recovery room.  He will be on aspirin as anticoagulation and he will then return to the intensive care unit.          ______________________________ Kipp Brood. Gladstone Lighter, M.D.     RAG/MEDQ  D:  07/16/2015  T:  07/17/2015  Job:  [id number]

## 2015-07-17 NOTE — Progress Notes (Signed)
Physical Therapy Treatment Patient Details Name: Juan Wiggins MRN: [id number] DOB: January 03, 1951 Today's Date: 07/17/2015    History of Present Illness pt presents with hx of recent spider bite and Septic R TKR, Septic Shock, PNA, and AKI.  pt with recent admit to hospital in [place] for R wrist Abscess.  pt with hx of Polysubstance Abuse, Hep C, Hep A, Depression, Anxiety, HTN, Tachycardia, Pancreatitis, Bil TKR, RA, Sleep Apnea, R Rotator Cuff Repair, Lumbar Lami, and Melanoma.  Pt s/p R I &D R knee with poly exchange and placement of antibiotic beads (5/23)    PT Comments    Pt underwent I & D R knee with poly exchange and placement of antibiotic beads (5/23) since last session. All goals still appropriate and no change in d/c recommendations.  Pt able to ambulate into hallway today with some shakiness, but min/guard A. MIN A for transfers.  Con't to recommend HHPT.  Follow Up Recommendations  Home health PT;Supervision/Assistance - 24 hour     Equipment Recommendations  None recommended by PT    Recommendations for Other Services       Precautions / Restrictions Precautions Precautions: Fall Required Braces or Orthoses: Knee Immobilizer - Right Knee Immobilizer - Right: Other (comment) (not specified) Restrictions Weight Bearing Restrictions: No RLE Weight Bearing: Weight bearing as tolerated    Mobility  Bed Mobility Overal bed mobility: Needs Assistance Bed Mobility: Supine to Sit     Supine to sit: Min guard     General bed mobility comments: Pt able to manage R LE on his own today  Transfers Overall transfer level: Needs assistance Equipment used: Rolling walker (2 wheeled) Transfers: Sit to/from Stand Sit to Stand: Min assist         General transfer comment: MIN A to power up and cues for hand placement.  Stood from bed and and from recliner  Ambulation/Gait Ambulation/Gait assistance: Min guard Ambulation Distance (Feet): 65 Feet Assistive device:  Rolling walker (2 wheeled) Gait Pattern/deviations: Step-to pattern;Antalgic;Trunk flexed Gait velocity: decreased Gait velocity interpretation: Below normal speed for age/gender General Gait Details: Cues for positioning of RW and posture.  Pt fairly shaky, but did not need more than min/guard A.  After gait, pt in recliner and then ambulated into bathroom and less shaky at that time   Stairs            Wheelchair Mobility    Modified Rankin (Stroke Patients Only)       Balance                                    Cognition Arousal/Alertness: Awake/alert Behavior During Therapy: WFL for tasks assessed/performed Overall Cognitive Status: Within Functional Limits for tasks assessed                      Exercises      General Comments        Pertinent Vitals/Pain Pain Assessment: 0-10 Pain Score: 5  Pain Location: R knee Pain Descriptors / Indicators: Aching;Sore Pain Intervention(s): Repositioned;Limited activity within patient's tolerance;Monitored during session    Home Living                      Prior Function            PT Goals (current goals can now be found in the care plan section) Acute Rehab PT Goals  PT Goal Formulation: With patient Time For Goal Achievement: 07/29/15 Potential to Achieve Goals: Good Progress towards PT goals: Progressing toward goals    Frequency  Min 5X/week    PT Plan Current plan remains appropriate    Co-evaluation             End of Session Equipment Utilized During Treatment: Gait belt Activity Tolerance: Patient tolerated treatment well Patient left: Other (comment);with call bell/phone within reach (on toilet)     Time: [id number] PT Time Calculation (min) (ACUTE ONLY): 21 min  Charges:  $Gait Training: 8-22 mins                    G Codes:      Arlyne Brandes LUBECK 07/17/2015, 11:41 AM

## 2015-07-17 NOTE — Progress Notes (Signed)
Attempted to call report, told RN for this patient was having an emergency with another patient.  Given phone number for return call.

## 2015-07-18 DIAGNOSIS — N170 Acute kidney failure with tubular necrosis: Secondary | ICD-10-CM

## 2015-07-18 DIAGNOSIS — B1920 Unspecified viral hepatitis C without hepatic coma: Secondary | ICD-10-CM

## 2015-07-18 DIAGNOSIS — M00261 Other streptococcal arthritis, right knee: Secondary | ICD-10-CM

## 2015-07-18 DIAGNOSIS — E038 Other specified hypothyroidism: Secondary | ICD-10-CM

## 2015-07-18 DIAGNOSIS — L03113 Cellulitis of right upper limb: Secondary | ICD-10-CM

## 2015-07-18 DIAGNOSIS — B954 Other streptococcus as the cause of diseases classified elsewhere: Secondary | ICD-10-CM

## 2015-07-18 DIAGNOSIS — R918 Other nonspecific abnormal finding of lung field: Secondary | ICD-10-CM

## 2015-07-18 DIAGNOSIS — B182 Chronic viral hepatitis C: Secondary | ICD-10-CM | POA: Diagnosis present

## 2015-07-18 DIAGNOSIS — M009 Pyogenic arthritis, unspecified: Secondary | ICD-10-CM | POA: Diagnosis present

## 2015-07-18 DIAGNOSIS — R7881 Bacteremia: Secondary | ICD-10-CM

## 2015-07-18 DIAGNOSIS — F330 Major depressive disorder, recurrent, mild: Secondary | ICD-10-CM

## 2015-07-18 DIAGNOSIS — G894 Chronic pain syndrome: Secondary | ICD-10-CM

## 2015-07-18 DIAGNOSIS — I10 Essential (primary) hypertension: Secondary | ICD-10-CM

## 2015-07-18 DIAGNOSIS — Z96653 Presence of artificial knee joint, bilateral: Secondary | ICD-10-CM

## 2015-07-18 DIAGNOSIS — B955 Unspecified streptococcus as the cause of diseases classified elsewhere: Secondary | ICD-10-CM | POA: Diagnosis present

## 2015-07-18 DIAGNOSIS — F1020 Alcohol dependence, uncomplicated: Secondary | ICD-10-CM

## 2015-07-18 DIAGNOSIS — D649 Anemia, unspecified: Secondary | ICD-10-CM | POA: Diagnosis present

## 2015-07-18 DIAGNOSIS — F411 Generalized anxiety disorder: Secondary | ICD-10-CM | POA: Diagnosis present

## 2015-07-18 LAB — RENAL FUNCTION PANEL
ALBUMIN: 2.2 g/dL — AB (ref 3.5–5.0)
Anion gap: 6 (ref 5–15)
BUN: 31 mg/dL — AB (ref 6–20)
CHLORIDE: 113 mmol/L — AB (ref 101–111)
CO2: 20 mmol/L — ABNORMAL LOW (ref 22–32)
CREATININE: 1.76 mg/dL — AB (ref 0.61–1.24)
Calcium: 8.9 mg/dL (ref 8.9–10.3)
GFR calc Af Amer: 46 mL/min — ABNORMAL LOW (ref >60)
GFR, EST NON AFRICAN AMERICAN: 39 mL/min — AB (ref >60)
Glucose, Bld: 120 mg/dL — ABNORMAL HIGH (ref 65–99)
Phosphorus: 3 mg/dL (ref 2.5–4.6)
Potassium: 5.3 mmol/L — ABNORMAL HIGH (ref 3.5–5.1)
SODIUM: 139 mmol/L (ref 135–145)

## 2015-07-18 LAB — BASIC METABOLIC PANEL
Anion gap: 5 (ref 5–15)
BUN: 31 mg/dL — ABNORMAL HIGH (ref 6–20)
CALCIUM: 9 mg/dL (ref 8.9–10.3)
CO2: 21 mmol/L — AB (ref 22–32)
CREATININE: 1.82 mg/dL — AB (ref 0.61–1.24)
Chloride: 114 mmol/L — ABNORMAL HIGH (ref 101–111)
GFR calc non Af Amer: 38 mL/min — ABNORMAL LOW (ref >60)
GFR, EST AFRICAN AMERICAN: 44 mL/min — AB (ref >60)
GLUCOSE: 120 mg/dL — AB (ref 65–99)
Potassium: 5.3 mmol/L — ABNORMAL HIGH (ref 3.5–5.1)
Sodium: 140 mmol/L (ref 135–145)

## 2015-07-18 LAB — CBC
HEMATOCRIT: 29.3 % — AB (ref 39.0–52.0)
Hemoglobin: 9.2 g/dL — ABNORMAL LOW (ref 13.0–17.0)
MCH: 28.7 pg (ref 26.0–34.0)
MCHC: 31.4 g/dL (ref 30.0–36.0)
MCV: 91.3 fL (ref 78.0–100.0)
Platelets: 170 10*3/uL (ref 150–400)
RBC: 3.21 MIL/uL — ABNORMAL LOW (ref 4.22–5.81)
RDW: 16.8 % — AB (ref 11.5–15.5)
WBC: 13.3 10*3/uL — ABNORMAL HIGH (ref 4.0–10.5)

## 2015-07-18 LAB — MAGNESIUM: MAGNESIUM: 1.7 mg/dL (ref 1.7–2.4)

## 2015-07-18 MED ORDER — RIFAMPIN 300 MG PO CAPS
300.0000 mg | ORAL_CAPSULE | Freq: Two times a day (BID) | ORAL | Status: DC
  Administered 2015-07-18 – 2015-07-21 (×6): 300 mg via ORAL
  Filled 2015-07-18 (×6): qty 1

## 2015-07-18 MED ORDER — ASPIRIN EC 325 MG PO TBEC
325.0000 mg | DELAYED_RELEASE_TABLET | Freq: Two times a day (BID) | ORAL | Status: DC
  Administered 2015-07-18 – 2015-07-19 (×4): 325 mg via ORAL
  Filled 2015-07-18 (×3): qty 1

## 2015-07-18 MED ORDER — PROPRANOLOL HCL ER 120 MG PO CP24
120.0000 mg | ORAL_CAPSULE | Freq: Every day | ORAL | Status: DC
  Administered 2015-07-18 – 2015-07-20 (×3): 120 mg via ORAL
  Filled 2015-07-18 (×3): qty 1

## 2015-07-18 NOTE — Progress Notes (Signed)
Occupational Therapy Treatment Patient Details Name: Juan Wiggins MRN: [id number] DOB: December 24, 1950 Today's Date: 07/18/2015    History of present illness pt presents with hx of recent spider bite and Septic R TKR, Septic Shock, PNA, and AKI.  pt with recent admit to hospital in [place] for R wrist Abscess.  pt with hx of Polysubstance Abuse, Hep C, Hep A, Depression, Anxiety, HTN, Tachycardia, Pancreatitis, Bil TKR, RA, Sleep Apnea, R Rotator Cuff Repair, Lumbar Lami, and Melanoma.  Pt s/p R I &D R knee with poly exchange and placement of antibiotic beads (5/23)   OT comments  Pt making good progress towards OT goals. Pt completed all LB ADLs and transfers with supervision level assist. Pt noted to have some wheezing/SOB after ambulation and SpO2 on RA was 93%, but returned to 96% after 1 min rest break. Will continue to follow acutely.   Follow Up Recommendations  No OT follow up;Supervision/Assistance - 24 hour    Equipment Recommendations  None recommended by OT    Recommendations for Other Services      Precautions / Restrictions Precautions Precautions: Fall Required Braces or Orthoses: Knee Immobilizer - Right Knee Immobilizer - Right: Other (comment) (Did not use, pt able to complete SLR w/o 10 degree lag) Restrictions Weight Bearing Restrictions: Yes RLE Weight Bearing: Weight bearing as tolerated       Mobility Bed Mobility               General bed mobility comments: Pt up in chair on OT arrival  Transfers Overall transfer level: Needs assistance Equipment used: Rolling walker (2 wheeled) Transfers: Sit to/from Stand Sit to Stand: Supervision         General transfer comment: Supervision for safety. Good demonstration of safe hand placement.    Balance Overall balance assessment: Needs assistance Sitting-balance support: No upper extremity supported;Feet supported Sitting balance-Leahy Scale: Good     Standing balance support: Bilateral upper  extremity supported;During functional activity Standing balance-Leahy Scale: Fair                     ADL Overall ADL's : Needs assistance/impaired     Grooming: Wash/dry hands;Supervision/safety;Standing   Upper Body Bathing: Supervision/ safety;Sitting   Lower Body Bathing: Supervison/ safety;Sit to/from stand   Upper Body Dressing : Supervision/safety;Sitting   Lower Body Dressing: Supervision/safety;Sit to/from stand;Cueing for compensatory techniques Lower Body Dressing Details (indicate cue type and reason): cues to dress RLE first and undress it last Toilet Transfer: Supervision/safety;Cueing for safety;Ambulation;BSC;RW   Toileting- Clothing Manipulation and Hygiene: Supervision/safety;Sit to/from stand       Functional mobility during ADLs: Supervision/safety;Rolling walker General ADL Comments: Educated on knee precautions and compenastory strategies.      Vision                     Perception     Praxis      Cognition   Behavior During Therapy: WFL for tasks assessed/performed Overall Cognitive Status: Within Functional Limits for tasks assessed                       Extremity/Trunk Assessment               Exercises     Shoulder Instructions       General Comments      Pertinent Vitals/ Pain       Pain Assessment: 0-10 Pain Score: 8  Pain Location: R knee Pain Descriptors /  Indicators: Aching;Sore Pain Intervention(s): Limited activity within patient's tolerance;Monitored during session;Premedicated before session;Repositioned  Home Living   Living Arrangements:  (Live-in caregiver)                                      Prior Functioning/Environment              Frequency Min 2X/week     Progress Toward Goals  OT Goals(current goals can now be found in the care plan section)  Progress towards OT goals: Progressing toward goals  Acute Rehab OT Goals Patient Stated Goal: Walk without  pain. OT Goal Formulation: With patient Time For Goal Achievement: 07/29/15 Potential to Achieve Goals: Good ADL Goals Pt Will Perform Grooming: with modified independence;standing Pt Will Perform Upper Body Bathing: with modified independence;sitting;standing Pt Will Perform Lower Body Bathing: with modified independence;sit to/from stand;with adaptive equipment Pt Will Perform Upper Body Dressing: with modified independence;sitting Pt Will Perform Lower Body Dressing: with modified independence;with adaptive equipment;sit to/from stand Pt Will Transfer to Toilet: with modified independence;ambulating;regular height toilet;bedside commode;grab bars Pt Will Perform Toileting - Clothing Manipulation and hygiene: with modified independence;sit to/from stand Pt Will Perform Tub/Shower Transfer: Tub transfer;with min guard assist;ambulating;shower seat;rolling walker  Plan Discharge plan remains appropriate    Co-evaluation                 End of Session Equipment Utilized During Treatment: Gait belt;Rolling walker   Activity Tolerance Patient tolerated treatment well   Patient Left in chair;with call bell/phone within reach;with family/visitor present   Nurse Communication Mobility status        Time: [id number] OT Time Calculation (min): 24 min  Charges: OT General Charges $OT Visit: 1 Procedure OT Treatments $Self Care/Home Management : 23-37 mins  Redmond Baseman, OTR/L Pager: [phone] 07/18/2015, 5:34 PM

## 2015-07-18 NOTE — Progress Notes (Signed)
Physical Therapy Treatment Patient Details Name: Juan Wiggins MRN: [id number] DOB: [birth year] Today's Date: 07/18/2015    History of Present Illness pt presents with hx of recent spider bite and Septic R TKR, Septic Shock, PNA, and AKI.  pt with recent admit to hospital in [place] for R wrist Abscess.  pt with hx of Polysubstance Abuse, Hep C, Hep A, Depression, Anxiety, HTN, Tachycardia, Pancreatitis, Bil TKR, RA, Sleep Apnea, R Rotator Cuff Repair, Lumbar Lami, and Melanoma.  Pt s/p R I &D R knee with poly exchange and placement of antibiotic beads (5/23)    PT Comments    When i got into room pt up walking with RW and no KI and no assist.  ORTHO MD - please advise - orders say to maintain KI and pt said he was told he can wear it when he wants to.  I had him put KI on with walking today - walked 100 feet with RW - 2 standing rest breaks due to SOB.  Pt progressing with  Mobilty.  Encouraged to call for help when up  Follow Up Recommendations  Home health PT;Supervision - Intermittent     Equipment Recommendations  None recommended by PT    Recommendations for Other Services       Precautions / Restrictions Precautions Precautions: Fall Required Braces or Orthoses: Knee Immobilizer - Right (order - maintain right KI) Restrictions RLE Weight Bearing: Weight bearing as tolerated    Mobility  Bed Mobility                  Transfers Overall transfer level: Needs assistance Equipment used: Rolling walker (2 wheeled) Transfers: Sit to/from Stand Sit to Stand: Min guard Stand pivot transfers: Min guard       General transfer comment: MIN A to power up and cues for hand placement.  Stood from toilet and then sat in recliner  Ambulation/Gait Ambulation/Gait assistance: Min guard Ambulation Distance (Feet): 100 Feet (with 2 standing rest breaks due to SOB) Assistive device: Rolling walker (2 wheeled) Gait Pattern/deviations: Step-to pattern;Antalgic     General Gait  Details: When i entered room - pt was up and trying to move IV and walk with RW to get to the bathroom - without KI on.  pt not shaky today but cued to call for help when up moving around   Stairs            Wheelchair Mobility    Modified Rankin (Stroke Patients Only)       Balance                                    Cognition Arousal/Alertness: Awake/alert Behavior During Therapy: [facility] for tasks assessed/performed                        Exercises Total Joint Exercises Ankle Circles/Pumps: AROM;Both;10 reps Quad Sets: AROM;Both;10 reps Straight Leg Raises: AROM;Both;10 reps    General Comments        Pertinent Vitals/Pain Pain Assessment: 0-10 Pain Score: 8  (with walking) Pain Intervention(s): Repositioned    Home Living                      Prior Function            PT Goals (current goals can now be found in the care plan section) Progress towards  PT goals: Progressing toward goals    Frequency  Min 5X/week    PT Plan Current plan remains appropriate    Co-evaluation             End of Session Equipment Utilized During Treatment: Gait belt Activity Tolerance: Patient tolerated treatment well Patient left: with call bell/phone within reach     Time: [id number] PT Time Calculation (min) (ACUTE ONLY): 20 min  Charges:  $Gait Training: 8-22 mins                    G Codes:      Loyal Buba 07/18/2015, 1:35 PM 07/18/2015   Rande Lawman, PT

## 2015-07-18 NOTE — Discharge Instructions (Signed)
Follow up with Dr. Gladstone Lighter in the office on Friday 6/2 (call for appointment) Weightbear as tolerated with walker Ice and elevate to reduce swelling Change knee dressing daily, keep clean and dry Take Aspirin 325mg  twice daily to prevent blood clots

## 2015-07-18 NOTE — Progress Notes (Signed)
HOSPITALIST [facility] Day: 4  Patient Name: Juan Wiggins Admission Date/Time: 07/14/2015  2:05 AM  Age/Sex:65 y.o.male MRN#: [id number]  DOB: [birth year] Attending Provider: Marja Kays, MD   LOS: 4 days   PCP/Outpatient Specialists: Patient Care Team: Helane Rima, MD as PCP - General (Family Medicine)     Brief Narrative:  65 y/o caucasian male who was initially admitted by Critical care service for septic shock transferred to floor 5/24, had c/o right knee pain and swelling on admission. H/o bilateral TKAs. Felt to be right septic knee, initially on vanco and cefepime, Ortho took to OR for knee arthroscopy lavage and extensive synovectomy 5/21. Fluid cx growing streptococcal group C as well as in his blood cultures (5/21). Went back to OR 5/23 for I&D and antibiotic beads. Repeat fluid Cx 5/23 with rare streptococcal still pending, and blood cx's 5/25 pending. ICU also mentioned bilateral lung opacities and right wrist cellulitis, which appears stable now.    Assessment:   Active Hospital Problems   Diagnosis Date Noted  . Severe sepsis ([place]) 07/14/2015  . Septic arthritis of knee, right ([place]) 07/18/2015    Priority: High  . Bacteremia due to Streptococcus 07/18/2015    Priority: High  . Cellulitis of right upper extremity 07/18/2015    Priority: Medium  . Opacity of lung on imaging study, bilateral 07/18/2015    Priority: Medium  . ARF (acute renal failure) ([place]) 04/13/2011    Priority: Medium  . Hepatitis C 07/18/2015  . Anemia 07/18/2015  . Anxiety state 07/18/2015  . History of total knee arthroplasty 07/16/2015  . Chronic pain syndrome 05/21/2014  . Essential hypertension, benign 08/28/2013  . Major depression ([place]) 07/07/2013  . Alcohol dependency ([place]) 07/07/2013  . Hypothyroidism 04/13/2011    Resolved Hospital Problems   Diagnosis Date Noted Date Resolved  No resolved problems to display.     Plan:  - patient seems to be improving,  clinically looks good. - WBC fluctuating, ID is on board and has changed vanco/cefepime to just ancef. Follow final recs on duration. Repeat cultures this AM thus far negative. TTE without vegetations. 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 He's had h/o of similar presentation in the past. I do not see that there would be C/I to PICC, if ID wants to continue with IV Abx and if feels safe to put since has bacteremia, likely ok to place PICC line. Would avoid renal toxic medications given risk of renal failure.  - Unclear if ID still wants to check immunoglobins for concern of immune defect. I'll order Hep C PCR. - Appreciate Ortho, has dc'd hemovac and stable for discharge from their standpoint. To follow up Next Friday 6/2. Follow up fluid cultures from 5/23.   - respiratory status appears stable, possible concern for PNA given bilateral opacities.  - BP is now starting to elevate, resume his propranolol.  - H/H remains stable, received 2 units PRBC 5/22 preop - let's make sure thyroid function still controlled on current dose, check TSH in AM.  - PT on board - continue all else as is.   I examined the patient and reviewed the chart, labs and data. I discussed the patient's status and plan of care with Patient  DVT prophylaxis:  None due to supposed allergy to heparin, on ASA BID Code Status: Full Code Family  Communication:   None Disposition Plan:  Home pending final ID recs (may need PICC and IV Abx arranged for home), CM arranging [place] with PT. No need for OT.    Consultants:   Infectious disease, Dr. Baxter Flattery  Orthopedics, Dr. Sunday Corn. [place] [facility]  Procedures:   5/21 Arthroscopic lavage and synovectomy right knee  5/23 open arthrotomy, extensive synovectomy, removal of previous polyethylene insert and replacement with same size insert (4-10 mm  thickness); antibiotic beads placed.  2D ECHO 5/23: Study Conclusions  - Left ventricle: The cavity size was normal. Wall thickness was  normal. Systolic function was normal. The estimated ejection  fraction was in the range of 60% to 65%. Wall motion was normal;  there were no regional wall motion abnormalities. - Aortic valve: Mildly calcified annulus. - Pulmonary arteries: Systolic pressure was moderately increased.  PA peak pressure: 57 mm Hg (S).   Antimicrobials:  Ampicillin 5/24  Cefepime 5/21-5/24  Vancomycin 5/21-5/24  Subjective:  Patient is doing well, some pain in leg especially when moves, but generally ok. Pain meds helping. No Cp or SOB. No right wrist pain/redness. Going to bathroom without issues.    Objective:  Temp:  [98.3 F (36.8 C)-99.3 F (37.4 C)] 98.3 F (36.8 C) (05/25 1225) Pulse Rate:  [61-90] 68 (05/25 1225) Resp:  [16-18] 18 (05/25 1225) BP: (137-164)/(73-95) 164/85 mmHg (05/25 1225) SpO2:  [93 %-97 %] 96 % (05/25 1225) Weight:  [104.554 kg (230 lb 8 oz)] 104.554 kg (230 lb 8 oz) (05/25 0130) SpO2 Readings from Last 3 Encounters:  07/18/15 96%  12/05/14 98%  12/01/14 96%    Intake/Output Summary (Last 24 hours) at 07/18/15 1250 Last data filed at 07/18/15 0802  Gross per 24 hour  Intake    805 ml  Output   2215 ml  Net  -1410 ml   Filed Weights   07/15/15 0500 07/16/15 0500 07/18/15 0130  Weight: 96.1 kg (211 lb 13.8 oz) 99.4 kg (219 lb 2.2 oz) 104.554 kg (230 lb 8 oz)   Body mass index is 32.16 kg/(m^2).   Physical Exam  Constitutional: He is oriented to person, place, and time and well-developed, well-nourished, and in no distress. No distress.  Cardiovascular: Normal rate, regular rhythm and normal heart sounds.   Pulmonary/Chest: Effort normal and breath sounds normal. No respiratory distress. He has no wheezes.  Abdominal: Soft. Bowel sounds are normal. He exhibits no distension. There is no tenderness.    Musculoskeletal: He exhibits no edema or tenderness.  Neurological: He is alert and oriented to person, place, and time. GCS score is 15.  Skin: Skin is warm and dry. He is not diaphoretic. No erythema.  Right knee dressed, no erythema of right wrist    Medications:  Scheduled Meds: . ampicillin (OMNIPEN) IV  2 g Intravenous Q4H  . antiseptic oral rinse  7 mL Mouth Rinse q12n4p  . aspirin EC  325 mg Oral BID  . bupivacaine liposome  20 mL Infiltration Once  . chlorhexidine  15 mL Mouth Rinse BID  . cholecalciferol  3,000 Units Oral Daily  . dicyclomine  20 mg Oral BID  . ferrous sulfate  325 mg Oral TID PC  . levothyroxine  175 mcg Oral QAC breakfast  . OLANZapine-FLUoxetine  2 capsule Oral QHS  . sucralfate  1 g Oral TID WC & HS   Continuous Infusions: . sodium chloride 10 mL/hr at 07/17/15 1132   PRN Meds:  acetaminophen 650 mg Q6H PRN  Or  acetaminophen 650 mg Q6H PRN  ALPRAZolam 0.25 mg QHS PRN  alum & mag hydroxide-simeth 30 mL Q4H PRN  bisacodyl 5 mg Daily PRN  HYDROcodone-acetaminophen 1-2 tablet Q4H PRN  HYDROmorphone (DILAUDID) injection 1 mg Q2H PRN  menthol-cetylpyridinium 1 lozenge PRN  Or    phenol 1 spray PRN  methocarbamol 500 mg Q6H PRN  Or    methocarbamol (ROBAXIN)  IV 500 mg Q6H PRN  metoCLOPramide 5-10 mg Q8H PRN  Or    metoCLOPramide (REGLAN) injection 5-10 mg Q8H PRN  ondansetron 4 mg Q6H PRN  Or    ondansetron (ZOFRAN) IV 4 mg Q6H PRN  oxyCODONE-acetaminophen 2 tablet Q4H PRN  polyethylene glycol 17 g Daily PRN  sodium phosphate 1 enema Once PRN    Labs:  CBC:  Recent Labs Lab 07/14/15 0830 07/14/15 1832 07/15/15 0330 07/16/15 0008 07/16/15 0617 07/17/15 0629 07/18/15 0513  WBC 40.6* 38.0* 23.8*  --  9.9 11.3* 13.3*  NEUTROABS 39.4*  --   --   --  7.7 9.9*  --   HGB 9.3* 9.1* 8.0* 9.6* 9.4* 9.7* 9.2*  HCT 28.2* 28.8* 25.0* 30.6* 29.3* 30.3* 29.3*  MCV 96.2 96.3 94.7  --  91.0 92.4 91.3  PLT 241 204 165  --  136* 130* 170     CBG:  Recent Labs Lab 07/14/15 1248  GLUCAP 109*    Basic Metabolic Panel:  Recent Labs Lab 07/15/15 0330 07/16/15 0617 07/17/15 0629 07/17/15 1902 07/18/15 0513  GLUCOSE 135* 93 134* 133* 120*  120*  NA 140 140 138 139 140  139  K 5.5* 4.7 6.0* 5.3* 5.3*  5.3*  CL 114* 113* 113* 111 114*  113*  CO2 22 21* 18* 21* 21*  20*  BUN 31* 30* 26* 30* 31*  31*  CREATININE 2.53* 2.06* 1.67* 1.95* 1.82*  1.76*  CALCIUM 7.4* 8.0* 8.9 9.0 9.0  8.9  MG 1.3* 2.0 1.8  --  1.7  PHOS  --  3.2 2.7  --  3.0    GFR: Estimated Creatinine Clearance: 52.9 mL/min (by C-G formula based on Cr of 1.76).  Liver Function Tests:  Recent Labs Lab 07/14/15 0830 07/14/15 1832 07/15/15 0330 07/16/15 0617 07/17/15 0629 07/18/15 0513  AST 30 25 21   --   --   --   ALT 19 22 21   --   --   --   ALKPHOS 57 68 61  --   --   --   BILITOT 0.8 1.0 0.6  --   --   --   PROT 5.1* 5.2* 4.8*  --   --   --   ALBUMIN 2.7* 2.4* 2.1* 2.1* 2.2* 2.2*   Latest HbA1C/Lipids/Thyroid/Anemia: Lab Results  Component Value Date   TRIG 88 05/22/2014   TSH 2.494 04/14/2011   VITAMINB12 239 04/14/2011    Sepsis Labs: Lab Results  Component Value Date   CRP 6.5* 07/14/2015   LATICACIDVEN 2.0 07/14/2015   LATICACIDVEN 4.02* 07/14/2015    Recent Results (from the past 240 hour(s))  Culture, blood (Routine X 2) w Reflex to ID Panel     Status: Abnormal   Collection Time: 07/14/15  8:50 AM  Result Value Ref Range Status   Specimen Description BLOOD RIGHT FOREARM  Final   Special Requests BOTTLES DRAWN AEROBIC AND ANAEROBIC 5.5CC EACH  Final   Culture  Setup Time   Final    IN BOTH AEROBIC AND ANAEROBIC BOTTLES GRAM POSITIVE COCCI IN CHAINS CRITICAL  RESULT CALLED TO, READ BACK BY AND VERIFIED WITH: TO VSMITH(RN) BY [facility] 07/15/15 AT 1:45AM    Culture STREPTOCOCCUS GROUP C (A)  Final   Report Status 07/17/2015 FINAL  Final   Organism ID, Bacteria STREPTOCOCCUS GROUP C  Final       Susceptibility   Streptococcus group c - MIC*    CLINDAMYCIN <=0.25 RESISTANT Resistant     AMPICILLIN <=0.25 SENSITIVE Sensitive     ERYTHROMYCIN 2 RESISTANT Resistant     VANCOMYCIN 0.5 SENSITIVE Sensitive     CEFTRIAXONE <=0.12 SENSITIVE Sensitive     LEVOFLOXACIN 0.5 SENSITIVE Sensitive     * STREPTOCOCCUS GROUP C  Blood Culture ID Panel (Reflexed)     Status: Abnormal   Collection Time: 07/14/15  8:50 AM  Result Value Ref Range Status   Enterococcus species NOT DETECTED NOT DETECTED Final   Vancomycin resistance NOT DETECTED NOT DETECTED Final   Listeria monocytogenes NOT DETECTED NOT DETECTED Final   Staphylococcus species NOT DETECTED NOT DETECTED Final   Staphylococcus aureus NOT DETECTED NOT DETECTED Final   Methicillin resistance NOT DETECTED NOT DETECTED Final   Streptococcus species DETECTED (A) NOT DETECTED Final    Comment: CRITICAL RESULT CALLED TO, READ BACK BY AND VERIFIED WITH: TO VSMITH (RN) BY [facility] 07/15/2015 AT 3:41AM    Streptococcus agalactiae NOT DETECTED NOT DETECTED Final   Streptococcus pneumoniae NOT DETECTED NOT DETECTED Final   Streptococcus pyogenes NOT DETECTED NOT DETECTED Final   Acinetobacter baumannii NOT DETECTED NOT DETECTED Final   Enterobacteriaceae species NOT DETECTED NOT DETECTED Final   Enterobacter cloacae complex NOT DETECTED NOT DETECTED Final   Escherichia coli NOT DETECTED NOT DETECTED Final   Klebsiella oxytoca NOT DETECTED NOT DETECTED Final   Klebsiella pneumoniae NOT DETECTED NOT DETECTED Final   Proteus species NOT DETECTED NOT DETECTED Final   Serratia marcescens NOT DETECTED NOT DETECTED Final   Carbapenem resistance NOT DETECTED NOT DETECTED Final   Haemophilus influenzae NOT DETECTED NOT DETECTED Final   Neisseria meningitidis NOT DETECTED NOT DETECTED Final   Pseudomonas aeruginosa NOT DETECTED NOT DETECTED Final   Candida albicans NOT DETECTED NOT DETECTED Final   Candida glabrata NOT DETECTED NOT DETECTED Final    Candida krusei NOT DETECTED NOT DETECTED Final   Candida parapsilosis NOT DETECTED NOT DETECTED Final   Candida tropicalis NOT DETECTED NOT DETECTED Final    Comment: Performed at [facility]  Culture, blood (Routine X 2) w Reflex to ID Panel     Status: Abnormal   Collection Time: 07/14/15  9:40 AM  Result Value Ref Range Status   Specimen Description BLOOD RIGHT ARM  Final   Special Requests BOTTLES DRAWN AEROBIC AND ANAEROBIC 5CC EACH  Final   Culture  Setup Time   Final    IN BOTH AEROBIC AND ANAEROBIC BOTTLES GRAM POSITIVE COCCI IN CHAINS CRITICAL RESULT CALLED TO, READ BACK BY AND VERIFIED WITH: TO VSMITH(RN) BY [facility] 07/15/2015 AT 1:33AM    Culture (A)  Final    STREPTOCOCCUS GROUP C SUSCEPTIBILITIES PERFORMED ON PREVIOUS CULTURE WITHIN THE LAST 5 DAYS. Performed at [facility]    Report Status 07/17/2015 FINAL  Final  Urine culture     Status: None   Collection Time: 07/14/15  9:40 AM  Result Value Ref Range Status   Specimen Description URINE, CATHETERIZED  Final   Special Requests NONE  Final   Culture NO GROWTH Performed at [facility]   Final   Report  Status 07/15/2015 FINAL  Final  Blood Culture ID Panel (Reflexed)     Status: Abnormal   Collection Time: 07/14/15  9:40 AM  Result Value Ref Range Status   Enterococcus species NOT DETECTED NOT DETECTED Final   Vancomycin resistance NOT DETECTED NOT DETECTED Final   Listeria monocytogenes NOT DETECTED NOT DETECTED Final   Staphylococcus species NOT DETECTED NOT DETECTED Final   Staphylococcus aureus NOT DETECTED NOT DETECTED Final   Methicillin resistance NOT DETECTED NOT DETECTED Final   Streptococcus species DETECTED (A) NOT DETECTED Final    Comment: CRITICAL RESULT CALLED TO, READ BACK BY AND VERIFIED WITH: TO VSMITH(RN) BY [facility] 07/15/15 AT 3:43AM    Streptococcus agalactiae NOT DETECTED NOT DETECTED Final   Streptococcus pneumoniae NOT DETECTED NOT DETECTED Final    Streptococcus pyogenes NOT DETECTED NOT DETECTED Final   Acinetobacter baumannii NOT DETECTED NOT DETECTED Final   Enterobacteriaceae species NOT DETECTED NOT DETECTED Final   Enterobacter cloacae complex NOT DETECTED NOT DETECTED Final   Escherichia coli NOT DETECTED NOT DETECTED Final   Klebsiella oxytoca NOT DETECTED NOT DETECTED Final   Klebsiella pneumoniae NOT DETECTED NOT DETECTED Final   Proteus species NOT DETECTED NOT DETECTED Final   Serratia marcescens NOT DETECTED NOT DETECTED Final   Carbapenem resistance NOT DETECTED NOT DETECTED Final   Haemophilus influenzae NOT DETECTED NOT DETECTED Final   Neisseria meningitidis NOT DETECTED NOT DETECTED Final   Pseudomonas aeruginosa NOT DETECTED NOT DETECTED Final   Candida albicans NOT DETECTED NOT DETECTED Final   Candida glabrata NOT DETECTED NOT DETECTED Final   Candida krusei NOT DETECTED NOT DETECTED Final   Candida parapsilosis NOT DETECTED NOT DETECTED Final   Candida tropicalis NOT DETECTED NOT DETECTED Final    Comment: Performed at [facility]  MRSA PCR Screening     Status: None   Collection Time: 07/14/15 12:57 PM  Result Value Ref Range Status   MRSA by PCR NEGATIVE NEGATIVE Final    Comment:        The GeneXpert MRSA Assay (FDA approved for NASAL specimens only), is one component of a comprehensive MRSA colonization surveillance program. It is not intended to diagnose MRSA infection nor to guide or monitor treatment for MRSA infections.   Culture, body fluid-bottle     Status: Abnormal   Collection Time: 07/14/15  2:00 PM  Result Value Ref Range Status   Specimen Description FLUID RIGHT KNEE  Final   Special Requests NONE  Final   Gram Stain   Final    GRAM POSITIVE COCCI IN BOTH AEROBIC AND ANAEROBIC BOTTLES CRITICAL RESULT CALLED TO, READ BACK BY AND VERIFIED WITH: RN Marlou Porch AT 2044 [id number] MARTINB    Culture STREPTOCOCCUS GROUP C (A)  Final   Report Status 07/16/2015 FINAL  Final   Organism  ID, Bacteria STREPTOCOCCUS GROUP C  Final      Susceptibility   Streptococcus group c - MIC*    CLINDAMYCIN <=0.25 RESISTANT Resistant     AMPICILLIN <=0.25 SENSITIVE Sensitive     ERYTHROMYCIN 2 RESISTANT Resistant     VANCOMYCIN 0.5 SENSITIVE Sensitive     CEFTRIAXONE <=0.12 SENSITIVE Sensitive     LEVOFLOXACIN 0.5 SENSITIVE Sensitive     * STREPTOCOCCUS GROUP C  Gram stain     Status: None   Collection Time: 07/14/15  2:00 PM  Result Value Ref Range Status   Specimen Description FLUID RIGHT KNEE  Final   Special Requests NONE  Final  Gram Stain   Final    WBC PRESENT, PREDOMINANTLY PMN ABUNDANT GRAM POSITIVE COCCI IN CHAINS CONFIRMED BY B.MARTIN    Report Status 07/14/2015 FINAL  Final  Anaerobic culture     Status: None (Preliminary result)   Collection Time: 07/14/15  4:58 PM  Result Value Ref Range Status   Specimen Description OTHER  Final   Special Requests RIGHT KNEE JOINT  Final   Gram Stain   Final    MODERATE WBC PRESENT,BOTH PMN AND MONONUCLEAR NO SQUAMOUS EPITHELIAL CELLS SEEN FEW GRAM POSITIVE COCCI IN PAIRS Performed at [facility]    Culture   Final    NO ANAEROBES ISOLATED; CULTURE IN PROGRESS FOR 5 DAYS Performed at [facility]    Report Status PENDING  Incomplete  Body fluid culture     Status: None (Preliminary result)   Collection Time: 07/16/15  2:35 PM  Result Value Ref Range Status   Specimen Description FLUID SYNOVIAL RIGHT KNEE  Final   Special Requests SWAB PT ON VANC  Final   Gram Stain   Final    MODERATE WBC PRESENT,BOTH PMN AND MONONUCLEAR NO ORGANISMS SEEN    Culture   Final    RARE STREPTOCOCCUS GROUP C CRITICAL RESULT CALLED TO, READ BACK BY AND VERIFIED WITH: KDoralee Albino AT 1208 ON [id number] BY Rhea Bleacher    Report Status PENDING  Incomplete  Culture, blood (Routine X 2) w Reflex to ID Panel     Status: None (Preliminary result)   Collection Time: 07/18/15  5:22 AM  Result Value Ref Range Status   Specimen  Description BLOOD RIGHT ANTECUBITAL  Final   Special Requests IN PEDIATRIC BOTTLE [facility]  Final   Culture PENDING  Incomplete   Report Status PENDING  Incomplete     Radiology Studies: No results found.  Time spent: 35 minutes  Marja Kays, MD [facility]  If 7PM-7AM, please contact night-coverage [email or web address] Password [email or web address] 07/18/2015, 12:50 PM

## 2015-07-18 NOTE — Progress Notes (Signed)
CRITICAL VALUE ALERT  Critical value received:  Rare Streptococcus Group C grown in Synovial Fluid Culture  Date of notification: 07/18/15  Time of notification:  1208  Critical value read back:Yes.    Nurse who received alert:  Egbert Garibaldi  MD notified (1st page):  Dr. Baxter Flattery  Time of first page:  51  MD notified (2nd page): Dr. Myna Bright  Time of second page: 1230   Responding MD:    Time MD responded:    Awaiting orders or call back. Will continue to monitor

## 2015-07-18 NOTE — Progress Notes (Signed)
Subjective: 2 Days Post-Op Procedure(s) (LRB): IRRIGATION AND DEBRIDEMENT RIGHT KNEE WITH POLY EXCHANGE AND PLACEMENT OF ANTIBIOTIC BEADS (Right) Patient reports pain as 2 on 0-10 scale.  Doing much better today. Hemovac DCd.He is ambulating. Dressing changed and he should be readt for Dc when Medical service determines that he is stable.HBg9.2 and stable  Objective: Vital signs in last 24 hours: Temp:  [98.3 F (36.8 C)-99.3 F (37.4 C)] 98.3 F (36.8 C) (05/25 0543) Pulse Rate:  [61-90] 61 (05/25 0543) Resp:  [16] 16 (05/25 0543) BP: (137-163)/(73-94) 163/94 mmHg (05/25 0543) SpO2:  [93 %-97 %] 94 % (05/25 0543) Weight:  [104.554 kg (230 lb 8 oz)] 104.554 kg (230 lb 8 oz) (05/25 0130)  Intake/Output from previous day: 05/24 0701 - 05/25 0700 In: -  Out: 2515 [Urine:2365; Drains:150] Intake/Output this shift:     Recent Labs  07/16/15 0008 07/16/15 0617 07/17/15 0629 07/18/15 0513  HGB 9.6* 9.4* 9.7* 9.2*    Recent Labs  07/17/15 0629 07/18/15 0513  WBC 11.3* 13.3*  RBC 3.28* 3.21*  HCT 30.3* 29.3*  PLT 130* 170    Recent Labs  07/17/15 0629 07/17/15 1902  NA 138 139  K 6.0* 5.3*  CL 113* 111  CO2 18* 21*  BUN 26* 30*  CREATININE 1.67* 1.95*  GLUCOSE 134* 133*  CALCIUM 8.9 9.0   No results for input(s): LABPT, INR in the last 72 hours.  Neurologically intact No cellulitis present Compartment soft  Assessment/Plan: 2 Days Post-Op Procedure(s) (LRB): IRRIGATION AND DEBRIDEMENT RIGHT KNEE WITH POLY EXCHANGE AND PLACEMENT OF ANTIBIOTIC BEADS (Right) Up with therapy discontinued when Medical Service determines that he is stable. See me in the office next Friday. We will arrange for [facility] to see him at home when [facility].Awaiting Last Cultures from OR on May 23.  Juan Wiggins A 07/18/2015, 7:20 AM

## 2015-07-19 DIAGNOSIS — R6520 Severe sepsis without septic shock: Secondary | ICD-10-CM

## 2015-07-19 LAB — RENAL FUNCTION PANEL
Albumin: 2.3 g/dL — ABNORMAL LOW (ref 3.5–5.0)
Anion gap: 7 (ref 5–15)
BUN: 26 mg/dL — AB (ref 6–20)
CALCIUM: 9.3 mg/dL (ref 8.9–10.3)
CHLORIDE: 113 mmol/L — AB (ref 101–111)
CO2: 20 mmol/L — AB (ref 22–32)
CREATININE: 1.59 mg/dL — AB (ref 0.61–1.24)
GFR, EST AFRICAN AMERICAN: 52 mL/min — AB (ref >60)
GFR, EST NON AFRICAN AMERICAN: 45 mL/min — AB (ref >60)
Glucose, Bld: 76 mg/dL (ref 65–99)
Phosphorus: 3.9 mg/dL (ref 2.5–4.6)
Potassium: 4.6 mmol/L (ref 3.5–5.1)
SODIUM: 140 mmol/L (ref 135–145)

## 2015-07-19 LAB — RETICULOCYTES
RBC.: 3.23 MIL/uL — AB (ref 4.22–5.81)
RETIC COUNT ABSOLUTE: 35.5 10*3/uL (ref 19.0–186.0)
RETIC CT PCT: 1.1 % (ref 0.4–3.1)

## 2015-07-19 LAB — CBC WITH DIFFERENTIAL/PLATELET
BASOS ABS: 0 10*3/uL (ref 0.0–0.1)
Basophils Relative: 0 %
EOS PCT: 4 %
Eosinophils Absolute: 0.4 10*3/uL (ref 0.0–0.7)
HEMATOCRIT: 28.4 % — AB (ref 39.0–52.0)
Hemoglobin: 9 g/dL — ABNORMAL LOW (ref 13.0–17.0)
LYMPHS PCT: 23 %
Lymphs Abs: 2.2 10*3/uL (ref 0.7–4.0)
MCH: 29.2 pg (ref 26.0–34.0)
MCHC: 31.7 g/dL (ref 30.0–36.0)
MCV: 92.2 fL (ref 78.0–100.0)
MONO ABS: 0.9 10*3/uL (ref 0.1–1.0)
MONOS PCT: 10 %
NEUTROS ABS: 6.1 10*3/uL (ref 1.7–7.7)
Neutrophils Relative %: 63 %
PLATELETS: 173 10*3/uL (ref 150–400)
RBC: 3.08 MIL/uL — ABNORMAL LOW (ref 4.22–5.81)
RDW: 16.3 % — ABNORMAL HIGH (ref 11.5–15.5)
WBC: 9.6 10*3/uL (ref 4.0–10.5)

## 2015-07-19 LAB — BODY FLUID CULTURE

## 2015-07-19 LAB — ANAEROBIC CULTURE

## 2015-07-19 LAB — IRON AND TIBC
Iron: 23 ug/dL — ABNORMAL LOW (ref 45–182)
SATURATION RATIOS: 9 % — AB (ref 17.9–39.5)
TIBC: 265 ug/dL (ref 250–450)
UIBC: 242 ug/dL

## 2015-07-19 LAB — FERRITIN: Ferritin: 177 ng/mL (ref 24–336)

## 2015-07-19 LAB — VITAMIN B12: VITAMIN B 12: 383 pg/mL (ref 180–914)

## 2015-07-19 LAB — TSH: TSH: 13.834 u[IU]/mL — ABNORMAL HIGH (ref 0.350–4.500)

## 2015-07-19 LAB — MAGNESIUM: MAGNESIUM: 1.4 mg/dL — AB (ref 1.7–2.4)

## 2015-07-19 LAB — FOLATE: FOLATE: 9.9 ng/mL (ref >5.9)

## 2015-07-19 LAB — PROCALCITONIN: PROCALCITONIN: 1.94 ng/mL

## 2015-07-19 MED ORDER — SODIUM CHLORIDE 0.9% FLUSH
10.0000 mL | INTRAVENOUS | Status: DC | PRN
  Administered 2015-07-19 – 2015-07-21 (×3): 10 mL
  Filled 2015-07-19 (×3): qty 40

## 2015-07-19 MED ORDER — HYDROMORPHONE HCL 1 MG/ML IJ SOLN
1.0000 mg | INTRAMUSCULAR | Status: DC | PRN
  Administered 2015-07-19 – 2015-07-21 (×8): 1 mg via INTRAVENOUS
  Filled 2015-07-19 (×8): qty 1

## 2015-07-19 MED ORDER — FERROUS SULFATE 325 (65 FE) MG PO TABS
325.0000 mg | ORAL_TABLET | Freq: Two times a day (BID) | ORAL | Status: DC
  Administered 2015-07-21: 325 mg via ORAL
  Filled 2015-07-19 (×2): qty 1

## 2015-07-19 MED ORDER — OXYCODONE-ACETAMINOPHEN 5-325 MG PO TABS
2.0000 | ORAL_TABLET | ORAL | Status: DC
  Administered 2015-07-19 – 2015-07-20 (×5): 2 via ORAL
  Filled 2015-07-19 (×5): qty 2

## 2015-07-19 MED ORDER — SODIUM CHLORIDE 0.9% FLUSH
10.0000 mL | Freq: Two times a day (BID) | INTRAVENOUS | Status: DC
  Administered 2015-07-19 – 2015-07-21 (×3): 10 mL

## 2015-07-19 NOTE — Progress Notes (Signed)
[facility]    Date of Admission:  07/14/2015   Total days of antibiotics 6        Day 3 ampicillin        Day 2 rifampin           ID: Juan Wiggins is a 65 y.o. male with  Sepsis due to streptococcal c bacteremia and prosthetic joint infection Principal Problem:   Severe sepsis ([place]) Active Problems:   ARF (acute renal failure) (HCC)   Hypothyroidism   Alcohol dependency ([place])   Major depression ([place])   Essential hypertension, benign   Chronic pain syndrome   History of total knee arthroplasty   Septic arthritis of knee, right ([place])   Bacteremia due to Streptococcus   Cellulitis of right upper extremity   Hepatitis C   Anemia   Opacity of lung on imaging study, bilateral   Anxiety state    Subjective: Afebrile. Slight nausea with rifampin  Ros: no chills, fevers, nightsweats  Medications:  . ampicillin (OMNIPEN) IV  2 g Intravenous Q4H  . antiseptic oral rinse  7 mL Mouth Rinse q12n4p  . aspirin EC  325 mg Oral BID  . bupivacaine liposome  20 mL Infiltration Once  . chlorhexidine  15 mL Mouth Rinse BID  . cholecalciferol  3,000 Units Oral Daily  . dicyclomine  20 mg Oral BID  . [START ON 07/20/2015] ferrous sulfate  325 mg Oral BID WC  . levothyroxine  175 mcg Oral QAC breakfast  . OLANZapine-FLUoxetine  2 capsule Oral QHS  . oxyCODONE-acetaminophen  2 tablet Oral Q4H  . propranolol ER  120 mg Oral QHS  . rifampin  300 mg Oral Q12H  . sodium chloride flush  10-40 mL Intracatheter Q12H  . sucralfate  1 g Oral TID WC & HS    Objective: Vital signs in last 24 hours: Temp:  [98.1 F (36.7 C)-99.5 F (37.5 C)] 98.6 F (37 C) (05/26 1420) Pulse Rate:  [51-69] 58 (05/26 1420) Resp:  [16-18] 18 (05/26 1420) BP: (148-166)/(75-91) 148/75 mmHg (05/26 1420) SpO2:  [97 %-98 %] 98 % (05/26 1420) Physical Exam  Constitutional: He is oriented to person, place, and time. He appears well-developed and well-nourished. No distress.  HENT:    Mouth/Throat: Oropharynx is clear and moist. No oropharyngeal exudate. Left IJ Cardiovascular: Normal rate, regular rhythm and normal heart sounds. Exam reveals no gallop and no friction rub.  No murmur heard.  Pulmonary/Chest: Effort normal and breath sounds normal. No respiratory distress. He has no wheezes.  Abdominal: Soft. Bowel sounds are normal. He exhibits no distension. There is no tenderness.  Lymphadenopathy:  He has no cervical adenopathy.  Ext: right knee is wrapped Neurological: He is alert and oriented to person, place, and time.  Skin: Skin is warm and dry. No rash noted. No erythema.  Psychiatric: He has a normal mood and affect. His behavior is normal.    Lab Results  Recent Labs  07/18/15 0513 07/19/15 0450  WBC 13.3* 9.6  HGB 9.2* 9.0*  HCT 29.3* 28.4*  NA 140  139 140  K 5.3*  5.3* 4.6  CL 114*  113* 113*  CO2 21*  20* 20*  BUN 31*  31* 26*  CREATININE 1.82*  1.76* 1.59*   Liver Panel  Recent Labs  07/18/15 0513 07/19/15 0450  ALBUMIN 2.2* 2.3*   Lab Results  Component Value Date   ESRSEDRATE 20* 07/14/2015   Lab Results  Component Value Date   CRP 6.5* 07/14/2015    Microbiology: 5/25 blood cx ngtd 5/23 synovial fluid strep group c 5/21 synovidal fluid strep group c 5/21 blood cx strep group c Studies/Results: No results found.  TTE no vegetation Assessment/Plan: [name] is a 65 y.o. male with  Sepsis due to streptococcal c bacteremia and prosthetic joint infection   Streptococcal prosthetic joint infection = will need 6 wk of antibiotics plus rifampin '300mg'$  PO bid. Can do continuous infusion of ampicillin at home vs. Ceftriaxone 2gm IV daily (which ever is more economical for patient and insurance)  Streptococcal bacteremia = will be treated by antibiotics  Acute on CKD = appears to be back at baseline  Chronic hep c without hepatic coma = checking genotype to help with outpatient treatment  Dr hatcher available  for questions, will arrange for follow up in 4-6 wk  Home health orders:" Diagnosis: Streptococcal prosthetic joint infection  Culture Result: strep group c  Allergies  Allergen Reactions  . Heparin Other (See Comments)    Low platelets.     Discharge antibiotics: Per pharmacy protocol  Ceftriaxone 2gm IV daily or continuous infusion ampicillin   Duration: 6 wk End Date: July 7th  [facility] Care Per Protocol:  Labs weekly while on IV antibiotics: x__ CBC with differential _x_ CMP _x_ CRP- at end of tx _x_ ESR at end of tx   Fax weekly labs to [phone]  Clinic Follow Up Appt: 6 wk  '@[facility]'$     Baxter Flattery [facility] Cell: [phone] Pager: [phone]  07/19/2015, 5:06 PM

## 2015-07-19 NOTE — Progress Notes (Signed)
   Subjective:  Patient reports pain as mild to moderate.  No c/o.  Objective:   VITALS:   Filed Vitals:   07/18/15 1225 07/18/15 1529 07/18/15 2049 07/19/15 0542  BP: 164/85 163/88 164/86 166/91  Pulse: 68  69 51  Temp: 98.3 F (36.8 C)  99.5 F (37.5 C) 98.1 F (36.7 C)  TempSrc: Oral  Oral Oral  Resp: 18  16 16   Height:      Weight:      SpO2: 96%  97% 98%    ABD soft Incision: scant drainage Dressing changed. Minimal dried ss drainage. 2+ DP. SILT. (+) TA/GS/EHL.  Lab Results  Component Value Date   WBC 9.6 07/19/2015   HGB 9.0* 07/19/2015   HCT 28.4* 07/19/2015   MCV 92.2 07/19/2015   PLT 173 07/19/2015   BMET    Component Value Date/Time   NA 140 07/19/2015 0450   K 4.6 07/19/2015 0450   CL 113* 07/19/2015 0450   CO2 20* 07/19/2015 0450   GLUCOSE 76 07/19/2015 0450   BUN 26* 07/19/2015 0450   CREATININE 1.59* 07/19/2015 0450   CALCIUM 9.3 07/19/2015 0450   GFRNONAA 45* 07/19/2015 0450   GFRAA 52* 07/19/2015 0450   intraop cultures NGTD  Assessment/Plan: 3 Days Post-Op   Principal Problem:   Severe sepsis ([place]) Active Problems:   ARF (acute renal failure) (HCC)   Hypothyroidism   Alcohol dependency ([place])   Major depression ([place])   Essential hypertension, benign   Chronic pain syndrome   History of total knee arthroplasty   Septic arthritis of knee, right (HCC)   Bacteremia due to Streptococcus   Cellulitis of right upper extremity   Hepatitis C   Anemia   Opacity of lung on imaging study, bilateral   Anxiety state   WBAT with walker R knee strep C PJI: cont ID recs, on ampicillin and rifampin, follow intraop cultures DVT ppx: ASA PT/OT Dispo: d/c home when medically ready, f/u with Dr. Gladstone Lighter next Friday   Geetika Laborde, Domynic Tindal 07/19/2015, 11:09 AM   Rod Can, MD Cell [phone]

## 2015-07-19 NOTE — Care Management Note (Signed)
Case Management Note  Patient Details  Name: Juan Wiggins MRN: [id number] Date of Birth: 1950/11/15  Subjective/Objective:               Admitted with sepsis, s/p right knee polyexchange     Action/Plan: Spoke with patient about discharge plan, explained about PICC and home IV antibiotics.Patient chose [facility] for his home health. Contacted Tyffany Waldrop with [facility] and set up [place] for IV antibx. [facility] aware that  Patient will probably discharge this weekend. Patient stated that his sister and his dad's caregiver should be able to learn to give IV antibiotics. CM will continue to follow for discharge needs.     Expected Discharge Date:                  Expected Discharge Plan:  [place]  In-House Referral:  NA  Discharge planning Services     Post Acute Care Choice:  Durable Medical Equipment, Home Health Choice offered to:  Patient  DME Arranged:  IV pump/equipment DME Agency:  [place] Arranged:  RN, PT [facility] Agency:  [place]  Status of Service:     Medicare Important Message Given:    Date Medicare IM Given:    Medicare IM give by:    Date Additional Medicare IM Given:    Additional Medicare Important Message give by:     If discussed at [place] of Stay Meetings, dates discussed:    Additional Comments:  Nila Nephew, RN 07/19/2015, 3:58 PM

## 2015-07-19 NOTE — Progress Notes (Addendum)
65 y/o ? Mod-severe pulm htn on echo 05/2015 euvolemic CHF-diastolic Chr pain  Hypothyroid Hep C-never Rx DJD-Bilat Knee, L hip repairs Bipolar admitted 5.21.17 for septic shock  Hospitalized [facility]  April 2017 for Rt wrist cellulitis after tick bite  had Group A strep.  complicated by pneumonia and AKI requiring temporary dialysis.  [please note AI work-up dated 06/12/15 was done and was NEGATIVE]   He had dialysis catheter removed.   On admit fever 101F and severe Rt knee pain.  He had knee replacement by Dr. Darrelyn Hillock several years ago.  He presented to ER. He was hypotensive with elevated WBC and lactic acid. He was given fluid but still hypotensive >> started on pressors.  Tx to floor 5/24, had c/o right knee pain and swelling on admission.  H/o bilateral TKAs.  Felt to be right septic knee, initially on vanco and cefepime-->ID saw 07/17/15-->Ampicillin Ortho took to OR for knee arthroscopy lavage and extensive synovectomy 5/21.  Fluid cx growing streptococcal group C as well as in his blood cultures (5/21)-->Rifampin 300 bi Went back to OR 5/23 for I&D and antibiotic beads.  Repeat fluid Cx 5/23 with rare streptococcal still pending, and blood cx's 5/25 pending.  ICU also mentioned bilateral lung opacities and right wrist cellulitis, which appears stable now.    Assessment:   Active Hospital Problems   Diagnosis Date Noted  . Severe sepsis (HCC) 07/14/2015  . Septic arthritis of knee, right (HCC) 07/18/2015  . Bacteremia due to Streptococcus 07/18/2015  . Cellulitis of right upper extremity 07/18/2015  . Hepatitis C 07/18/2015  . Anemia 07/18/2015  . Opacity of lung on imaging study, bilateral 07/18/2015  . Anxiety state 07/18/2015  . History of total knee arthroplasty 07/16/2015  . Chronic pain syndrome 05/21/2014  . Essential hypertension, benign 08/28/2013  . Major depression (HCC) 07/07/2013  . Alcohol dependency (HCC) 07/07/2013  . ARF (acute renal failure)  (HCC) 04/13/2011  . Hypothyroidism 04/13/2011    Resolved Hospital Problems   Diagnosis Date Noted Date Resolved  No resolved problems to display.     Plan:  - patient seems to be improving, clinically looks good. - WBC fluctuating, ID is on board and has changed vanco/cefepime to just ampicillin + Rifampin 300 bid.Repeat cultures this AM thus far negative. TTE without vegetations.  - appears his renal dysfunction is all acute, he required temporary HD while in the ICU. He's had h/o of similar presentation in the past. I do not see that there would be C/I to PICC, if ID wants to continue with IV Abx and if feels safe to put since has bacteremia,PICC to be placed 07/19/2015 -Unlikely Autoimmune disease-defer as OP to PCP for Immunology/ allergy referral - Appreciate Ortho, has dc'd hemovac and stable for discharge from their standpoint. To follow up Next Friday 6/2. Follow up fluid cultures from 5/23.   - respiratory status appears stable, possible concern for PNA given bilateral opacities.  - BP is now starting to elevate, resume his propranolol. No lisinopril 2/2 to CKD - H/H remains stable, received 2 units PRBC 5/22 preop -  TSH 13--woul NOT adjust synthroid in settign acute illness--HPA axis will surely be affected bty stress of sepsis - PT on board - continue all else as is.   I examined the patient and reviewed the chart, labs and data. I discussed the patient's status and plan of care with Patient  DVT prophylaxis:  None due to supposed allergy to heparin, on ASA BID Code  Status: Full Code Family Communication:   None Disposition Plan:  Home pending final ID recs in 24 hr   Consultants:   Infectious disease, Dr. Baxter Flattery  Orthopedics, Dr. Sunday Corn. [place] Lighter  Procedures:   5/21 Arthroscopic lavage and synovectomy right knee  5/23 open arthrotomy, extensive synovectomy, removal of previous polyethylene insert and replacement with same size insert (4-10 mm thickness);  antibiotic beads placed.  2D ECHO 5/23: Study Conclusions  - Left ventricle: The cavity size was normal. Wall thickness was  normal. Systolic function was normal. The estimated ejection  fraction was in the range of 60% to 65%. Wall motion was normal;  there were no regional wall motion abnormalities. - Aortic valve: Mildly calcified annulus. - Pulmonary arteries: Systolic pressure was moderately increased.  PA peak pressure: 57 mm Hg (S).   Antimicrobials:  Ampicillin 5/24-->6 weeks ending July 5th?  Rifampin 5/24--->July 5th  Cefepime 5/21-5/24  Vancomycin 5/21-5/24  Subjective:   Fair Walked comfortable from RR with no discomfort No cp No f  No chills no rigor   Objective:  Temp:  [98.1 F (36.7 C)-99.5 F (37.5 C)] 98.6 F (37 C) (05/26 1420) Pulse Rate:  [51-69] 58 (05/26 1420) Resp:  [16-18] 18 (05/26 1420) BP: (148-166)/(75-91) 148/75 mmHg (05/26 1420) SpO2:  [97 %-98 %] 98 % (05/26 1420) SpO2 Readings from Last 3 Encounters:  07/19/15 98%  12/05/14 98%  12/01/14 96%    Intake/Output Summary (Last 24 hours) at 07/19/15 1516 Last data filed at 07/19/15 1421  Gross per 24 hour  Intake    910 ml  Output   1050 ml  Net   -140 ml   Filed Weights   07/15/15 0500 07/16/15 0500 07/18/15 0130  Weight: 96.1 kg (211 lb 13.8 oz) 99.4 kg (219 lb 2.2 oz) 104.554 kg (230 lb 8 oz)   Body mass index is 32.16 kg/(m^2).   Physical Exam  Constitutional: He is oriented to person, place, and time and well-developed, well-nourished, and in no distress. No distress.  Cardiovascular: Normal rate, regular rhythm and normal heart sounds.   Pulmonary/Chest: Effort normal and breath sounds normal. No respiratory distress. He has no wheezes.  Abdominal: Soft. Bowel sounds are normal. He exhibits no distension. There is no tenderness.  Musculoskeletal: He exhibits no edema or tenderness.  Neurological: He is alert and oriented to person, place, and time. GCS score is  15.  Skin: Skin is warm and dry. He is not diaphoretic. No erythema.  Right knee dressed, no erythema of right wrist    Medications:  Scheduled Meds: . ampicillin (OMNIPEN) IV  2 g Intravenous Q4H  . antiseptic oral rinse  7 mL Mouth Rinse q12n4p  . aspirin EC  325 mg Oral BID  . bupivacaine liposome  20 mL Infiltration Once  . chlorhexidine  15 mL Mouth Rinse BID  . cholecalciferol  3,000 Units Oral Daily  . dicyclomine  20 mg Oral BID  . ferrous sulfate  325 mg Oral TID PC  . levothyroxine  175 mcg Oral QAC breakfast  . OLANZapine-FLUoxetine  2 capsule Oral QHS  . oxyCODONE-acetaminophen  2 tablet Oral Q4H  . propranolol ER  120 mg Oral QHS  . rifampin  300 mg Oral Q12H  . sodium chloride flush  10-40 mL Intracatheter Q12H  . sucralfate  1 g Oral TID WC & HS   Continuous Infusions: . sodium chloride 10 mL/hr at 07/17/15 1132   PRN Meds:   acetaminophen 650 mg Q6H PRN  Or    acetaminophen 650 mg Q6H PRN  ALPRAZolam 0.25 mg QHS PRN  alum & mag hydroxide-simeth 30 mL Q4H PRN  bisacodyl 5 mg Daily PRN  HYDROcodone-acetaminophen 1-2 tablet Q4H PRN  HYDROmorphone (DILAUDID) injection 1 mg Q2H PRN  menthol-cetylpyridinium 1 lozenge PRN  Or    phenol 1 spray PRN  methocarbamol 500 mg Q6H PRN  Or    methocarbamol (ROBAXIN)  IV 500 mg Q6H PRN  metoCLOPramide 5-10 mg Q8H PRN  Or    metoCLOPramide (REGLAN) injection 5-10 mg Q8H PRN  ondansetron 4 mg Q6H PRN  Or    ondansetron (ZOFRAN) IV 4 mg Q6H PRN  oxyCODONE-acetaminophen 2 tablet Q4H PRN  polyethylene glycol 17 g Daily PRN  sodium chloride flush 10-40 mL PRN  sodium phosphate 1 enema Once PRN    Labs:  CBC:  Recent Labs Lab 07/14/15 0830  07/15/15 0330 07/16/15 0008 07/16/15 0617 07/17/15 0629 07/18/15 0513 07/19/15 0450  WBC 40.6*  < > 23.8*  --  9.9 11.3* 13.3* 9.6  NEUTROABS 39.4*  --   --   --  7.7 9.9*  --  6.1  HGB 9.3*  < > 8.0* 9.6* 9.4* 9.7* 9.2* 9.0*  HCT 28.2*  < > 25.0* 30.6* 29.3* 30.3*  29.3* 28.4*  MCV 96.2  < > 94.7  --  91.0 92.4 91.3 92.2  PLT 241  < > 165  --  136* 130* 170 173  < > = values in this interval not displayed.  CBG:  Recent Labs Lab 07/14/15 1248  GLUCAP 109*    Basic Metabolic Panel:  Recent Labs Lab 07/15/15 0330 07/16/15 0617 07/17/15 0629 07/17/15 1902 07/18/15 0513 07/19/15 0450  GLUCOSE 135* 93 134* 133* 120*  120* 76  NA 140 140 138 139 140  139 140  K 5.5* 4.7 6.0* 5.3* 5.3*  5.3* 4.6  CL 114* 113* 113* 111 114*  113* 113*  CO2 22 21* 18* 21* 21*  20* 20*  BUN 31* 30* 26* 30* 31*  31* 26*  CREATININE 2.53* 2.06* 1.67* 1.95* 1.82*  1.76* 1.59*  CALCIUM 7.4* 8.0* 8.9 9.0 9.0  8.9 9.3  MG 1.3* 2.0 1.8  --  1.7 1.4*  PHOS  --  3.2 2.7  --  3.0 3.9    GFR: Estimated Creatinine Clearance: 58.5 mL/min (by C-G formula based on Cr of 1.59).  Liver Function Tests:  Recent Labs Lab 07/14/15 0830 07/14/15 1832 07/15/15 0330 07/16/15 0617 07/17/15 0629 07/18/15 0513 07/19/15 0450  AST 30 25 21   --   --   --   --   ALT 19 22 21   --   --   --   --   ALKPHOS 57 68 61  --   --   --   --   BILITOT 0.8 1.0 0.6  --   --   --   --   PROT 5.1* 5.2* 4.8*  --   --   --   --   ALBUMIN 2.7* 2.4* 2.1* 2.1* 2.2* 2.2* 2.3*   Latest HbA1C/Lipids/Thyroid/Anemia: Lab Results  Component Value Date   TRIG 88 05/22/2014   TSH 13.834* 07/19/2015   VITAMINB12 239 04/14/2011    Sepsis Labs: Lab Results  Component Value Date   CRP 6.5* 07/14/2015   LATICACIDVEN 2.0 07/14/2015   LATICACIDVEN 4.02* 07/14/2015    Recent Results (from the past 240 hour(s))  Culture, blood (Routine X 2) w Reflex to ID Panel  Status: Abnormal   Collection Time: 07/14/15  8:50 AM  Result Value Ref Range Status   Specimen Description BLOOD RIGHT FOREARM  Final   Special Requests BOTTLES DRAWN AEROBIC AND ANAEROBIC 5.5CC EACH  Final   Culture  Setup Time   Final    IN BOTH AEROBIC AND ANAEROBIC BOTTLES GRAM POSITIVE COCCI IN CHAINS CRITICAL RESULT  CALLED TO, READ BACK BY AND VERIFIED WITH: TO VSMITH(RN) BY TCLEVELAND 07/15/15 AT 1:45AM    Culture STREPTOCOCCUS GROUP C (A)  Final   Report Status 07/17/2015 FINAL  Final   Organism ID, Bacteria STREPTOCOCCUS GROUP C  Final      Susceptibility   Streptococcus group c - MIC*    CLINDAMYCIN <=0.25 RESISTANT Resistant     AMPICILLIN <=0.25 SENSITIVE Sensitive     ERYTHROMYCIN 2 RESISTANT Resistant     VANCOMYCIN 0.5 SENSITIVE Sensitive     CEFTRIAXONE <=0.12 SENSITIVE Sensitive     LEVOFLOXACIN 0.5 SENSITIVE Sensitive     * STREPTOCOCCUS GROUP C  Blood Culture ID Panel (Reflexed)     Status: Abnormal   Collection Time: 07/14/15  8:50 AM  Result Value Ref Range Status   Enterococcus species NOT DETECTED NOT DETECTED Final   Vancomycin resistance NOT DETECTED NOT DETECTED Final   Listeria monocytogenes NOT DETECTED NOT DETECTED Final   Staphylococcus species NOT DETECTED NOT DETECTED Final   Staphylococcus aureus NOT DETECTED NOT DETECTED Final   Methicillin resistance NOT DETECTED NOT DETECTED Final   Streptococcus species DETECTED (A) NOT DETECTED Final    Comment: CRITICAL RESULT CALLED TO, READ BACK BY AND VERIFIED WITH: TO VSMITH (RN) BY [facility] 07/15/2015 AT 3:41AM    Streptococcus agalactiae NOT DETECTED NOT DETECTED Final   Streptococcus pneumoniae NOT DETECTED NOT DETECTED Final   Streptococcus pyogenes NOT DETECTED NOT DETECTED Final   Acinetobacter baumannii NOT DETECTED NOT DETECTED Final   Enterobacteriaceae species NOT DETECTED NOT DETECTED Final   Enterobacter cloacae complex NOT DETECTED NOT DETECTED Final   Escherichia coli NOT DETECTED NOT DETECTED Final   Klebsiella oxytoca NOT DETECTED NOT DETECTED Final   Klebsiella pneumoniae NOT DETECTED NOT DETECTED Final   Proteus species NOT DETECTED NOT DETECTED Final   Serratia marcescens NOT DETECTED NOT DETECTED Final   Carbapenem resistance NOT DETECTED NOT DETECTED Final   Haemophilus influenzae NOT DETECTED NOT  DETECTED Final   Neisseria meningitidis NOT DETECTED NOT DETECTED Final   Pseudomonas aeruginosa NOT DETECTED NOT DETECTED Final   Candida albicans NOT DETECTED NOT DETECTED Final   Candida glabrata NOT DETECTED NOT DETECTED Final   Candida krusei NOT DETECTED NOT DETECTED Final   Candida parapsilosis NOT DETECTED NOT DETECTED Final   Candida tropicalis NOT DETECTED NOT DETECTED Final    Comment: Performed at [facility]  Culture, blood (Routine X 2) w Reflex to ID Panel     Status: Abnormal   Collection Time: 07/14/15  9:40 AM  Result Value Ref Range Status   Specimen Description BLOOD RIGHT ARM  Final   Special Requests BOTTLES DRAWN AEROBIC AND ANAEROBIC 5CC EACH  Final   Culture  Setup Time   Final    IN BOTH AEROBIC AND ANAEROBIC BOTTLES GRAM POSITIVE COCCI IN CHAINS CRITICAL RESULT CALLED TO, READ BACK BY AND VERIFIED WITH: TO VSMITH(RN) BY TCLEVELAND 07/15/2015 AT 1:33AM    Culture (A)  Final    STREPTOCOCCUS GROUP C SUSCEPTIBILITIES PERFORMED ON PREVIOUS CULTURE WITHIN THE LAST 5 DAYS. Performed at [facility]  Report Status 07/17/2015 FINAL  Final  Urine culture     Status: None   Collection Time: 07/14/15  9:40 AM  Result Value Ref Range Status   Specimen Description URINE, CATHETERIZED  Final   Special Requests NONE  Final   Culture NO GROWTH Performed at [facility]   Final   Report Status 07/15/2015 FINAL  Final  Blood Culture ID Panel (Reflexed)     Status: Abnormal   Collection Time: 07/14/15  9:40 AM  Result Value Ref Range Status   Enterococcus species NOT DETECTED NOT DETECTED Final   Vancomycin resistance NOT DETECTED NOT DETECTED Final   Listeria monocytogenes NOT DETECTED NOT DETECTED Final   Staphylococcus species NOT DETECTED NOT DETECTED Final   Staphylococcus aureus NOT DETECTED NOT DETECTED Final   Methicillin resistance NOT DETECTED NOT DETECTED Final   Streptococcus species DETECTED (A) NOT DETECTED Final    Comment:  CRITICAL RESULT CALLED TO, READ BACK BY AND VERIFIED WITH: TO VSMITH(RN) BY TCLEVELAND 07/15/15 AT 3:43AM    Streptococcus agalactiae NOT DETECTED NOT DETECTED Final   Streptococcus pneumoniae NOT DETECTED NOT DETECTED Final   Streptococcus pyogenes NOT DETECTED NOT DETECTED Final   Acinetobacter baumannii NOT DETECTED NOT DETECTED Final   Enterobacteriaceae species NOT DETECTED NOT DETECTED Final   Enterobacter cloacae complex NOT DETECTED NOT DETECTED Final   Escherichia coli NOT DETECTED NOT DETECTED Final   Klebsiella oxytoca NOT DETECTED NOT DETECTED Final   Klebsiella pneumoniae NOT DETECTED NOT DETECTED Final   Proteus species NOT DETECTED NOT DETECTED Final   Serratia marcescens NOT DETECTED NOT DETECTED Final   Carbapenem resistance NOT DETECTED NOT DETECTED Final   Haemophilus influenzae NOT DETECTED NOT DETECTED Final   Neisseria meningitidis NOT DETECTED NOT DETECTED Final   Pseudomonas aeruginosa NOT DETECTED NOT DETECTED Final   Candida albicans NOT DETECTED NOT DETECTED Final   Candida glabrata NOT DETECTED NOT DETECTED Final   Candida krusei NOT DETECTED NOT DETECTED Final   Candida parapsilosis NOT DETECTED NOT DETECTED Final   Candida tropicalis NOT DETECTED NOT DETECTED Final    Comment: Performed at [facility]  MRSA PCR Screening     Status: None   Collection Time: 07/14/15 12:57 PM  Result Value Ref Range Status   MRSA by PCR NEGATIVE NEGATIVE Final    Comment:        The GeneXpert MRSA Assay (FDA approved for NASAL specimens only), is one component of a comprehensive MRSA colonization surveillance program. It is not intended to diagnose MRSA infection nor to guide or monitor treatment for MRSA infections.   Culture, body fluid-bottle     Status: Abnormal   Collection Time: 07/14/15  2:00 PM  Result Value Ref Range Status   Specimen Description FLUID RIGHT KNEE  Final   Special Requests NONE  Final   Gram Stain   Final    GRAM POSITIVE COCCI  IN BOTH AEROBIC AND ANAEROBIC BOTTLES CRITICAL RESULT CALLED TO, READ BACK BY AND VERIFIED WITH: RN Marlou Porch AT [id number] [id number] MARTINB    Culture STREPTOCOCCUS GROUP C (A)  Final   Report Status 07/16/2015 FINAL  Final   Organism ID, Bacteria STREPTOCOCCUS GROUP C  Final      Susceptibility   Streptococcus group c - MIC*    CLINDAMYCIN <=0.25 RESISTANT Resistant     AMPICILLIN <=0.25 SENSITIVE Sensitive     ERYTHROMYCIN 2 RESISTANT Resistant     VANCOMYCIN 0.5 SENSITIVE Sensitive  CEFTRIAXONE <=0.12 SENSITIVE Sensitive     LEVOFLOXACIN 0.5 SENSITIVE Sensitive     * STREPTOCOCCUS GROUP C  Gram stain     Status: None   Collection Time: 07/14/15  2:00 PM  Result Value Ref Range Status   Specimen Description FLUID RIGHT KNEE  Final   Special Requests NONE  Final   Gram Stain   Final    WBC PRESENT, PREDOMINANTLY PMN ABUNDANT GRAM POSITIVE COCCI IN CHAINS CONFIRMED BY B.MARTIN    Report Status 07/14/2015 FINAL  Final  Anaerobic culture     Status: None   Collection Time: 07/14/15  4:58 PM  Result Value Ref Range Status   Specimen Description OTHER  Final   Special Requests RIGHT KNEE JOINT  Final   Gram Stain   Final    MODERATE WBC PRESENT,BOTH PMN AND MONONUCLEAR NO SQUAMOUS EPITHELIAL CELLS SEEN FEW GRAM POSITIVE COCCI IN PAIRS Performed at [facility]    Culture   Final    NO ANAEROBES ISOLATED Performed at [facility]    Report Status 07/19/2015 FINAL  Final  Anaerobic culture     Status: None (Preliminary result)   Collection Time: 07/16/15  2:35 PM  Result Value Ref Range Status   Specimen Description FLUID SYNOVIAL RIGHT KNEE  Final   Special Requests SWAB PT ON VANC  Final   Gram Stain   Final    MODERATE WBC PRESENT,BOTH PMN AND MONONUCLEAR NO ORGANISMS SEEN    Culture   Final    NO ANAEROBES ISOLATED; CULTURE IN PROGRESS FOR 5 DAYS   Report Status PENDING  Incomplete  Body fluid culture     Status: None   Collection Time: 07/16/15  2:35  PM  Result Value Ref Range Status   Specimen Description FLUID SYNOVIAL RIGHT KNEE  Final   Special Requests SWAB PT ON VANC  Final   Gram Stain   Final    MODERATE WBC PRESENT,BOTH PMN AND MONONUCLEAR NO ORGANISMS SEEN    Culture   Final    RARE STREPTOCOCCUS GROUP C CRITICAL RESULT CALLED TO, READ BACK BY AND VERIFIED WITH: Redge Gainer AT 1208 ON [id number] BY Rhea Bleacher    Report Status 07/19/2015 FINAL  Final   Organism ID, Bacteria STREPTOCOCCUS GROUP C  Final      Susceptibility   Streptococcus group c - MIC*    CLINDAMYCIN <=0.25 RESISTANT Resistant     AMPICILLIN <=0.25 SENSITIVE Sensitive     ERYTHROMYCIN 2 RESISTANT Resistant     VANCOMYCIN 0.5 SENSITIVE Sensitive     CEFTRIAXONE <=0.12 SENSITIVE Sensitive     LEVOFLOXACIN 0.5 SENSITIVE Sensitive     * RARE STREPTOCOCCUS GROUP C  Culture, blood (Routine X 2) w Reflex to ID Panel     Status: None (Preliminary result)   Collection Time: 07/18/15  5:22 AM  Result Value Ref Range Status   Specimen Description BLOOD RIGHT ANTECUBITAL  Final   Special Requests IN PEDIATRIC BOTTLE 2CC  Final   Culture NO GROWTH 1 DAY  Final   Report Status PENDING  Incomplete  Culture, blood (Routine X 2) w Reflex to ID Panel     Status: None (Preliminary result)   Collection Time: 07/18/15  5:28 AM  Result Value Ref Range Status   Specimen Description BLOOD RIGHT HAND  Final   Special Requests IN PEDIATRIC BOTTLE 3CC  Final   Culture NO GROWTH 1 DAY  Final   Report Status PENDING  Incomplete  Radiology Studies: No results found.  Time spent: 35 minutes  Nita Sells, MD [facility]  If 7PM-7AM, please contact night-coverage [email or web address] Password [facility] 07/19/2015, 3:16 PM

## 2015-07-19 NOTE — Progress Notes (Signed)
Occupational Therapy Treatment Patient Details Name: Juan Wiggins MRN: [id number] DOB: October 08, 1950 Today's Date: 07/19/2015    History of present illness pt presents with hx of recent spider bite and Septic R TKR, Septic Shock, PNA, and AKI.  pt with recent admit to hospital in [place] for R wrist Abscess.  pt with hx of Polysubstance Abuse, Hep C, Hep A, Depression, Anxiety, HTN, Tachycardia, Pancreatitis, Bil TKR, RA, Sleep Apnea, R Rotator Cuff Repair, Lumbar Lami, and Melanoma.  Pt s/p R I &D R knee with poly exchange and placement of antibiotic beads (5/23)   OT comments  Pt. Reports nausea this day so session limited as he is not feeling well.  Provided demonstration of tub transfer and pt. Able to initiate but not complete bringing RLE over tub ledge.  Reports he will sponge bathe initially until able to bend knee to bring RLE into tub safely.    Follow Up Recommendations  No OT follow up;Supervision/Assistance - 24 hour    Equipment Recommendations  None recommended by OT    Recommendations for Other Services      Precautions / Restrictions Precautions Precautions: Fall Restrictions RLE Weight Bearing: Weight bearing as tolerated       Mobility Bed Mobility Overal bed mobility: Needs Assistance             General bed mobility comments: in recliner upon arrival into room  Transfers Overall transfer level: Needs assistance Equipment used: Rolling walker (2 wheeled) Transfers: Sit to/from [facility] Sit to Stand: Supervision         General transfer comment: Supervision for safety. Good demonstration of safe hand placement.    Balance                                   ADL Overall ADL's : Needs assistance/impaired                                 Tub/ Shower Transfer: Tub transfer;Maximal assistance Tub/Shower Transfer Details (indicate cue type and reason): attempted simulated side step for tub transfer with R  faucet.  pt. able bear weight through RLE to step over tub with LLE but could not bring RLE into tub due to limited knee flexion.  reviewed sponge bathing initially.  pt. agreed and feels he will gaint he flexion required to complete the side step soon Functional mobility during ADLs: Supervision/safety;Rolling walker        Vision                     Perception     Praxis      Cognition   Behavior During Therapy: [facility] for tasks assessed/performed Overall Cognitive Status: Within Functional Limits for tasks assessed                       Extremity/Trunk Assessment               Exercises     Shoulder Instructions       General Comments      Pertinent Vitals/ Pain       Pain Assessment: No/denies pain  Home Living  Prior Functioning/Environment              Frequency Min 2X/week     Progress Toward Goals  OT Goals(current goals can now be found in the care plan section)  Progress towards OT goals: Progressing toward goals     Plan Discharge plan remains appropriate    Co-evaluation                 End of Session Equipment Utilized During Treatment: Gait belt;Rolling walker   Activity Tolerance Patient tolerated treatment well   Patient Left in chair;with call bell/phone within reach   Nurse Communication          Time: 1010-1018 OT Time Calculation (min): 8 min  Charges: OT General Charges $OT Visit: 1 Procedure OT Treatments $Self Care/Home Management : 8-22 mins  Janice Coffin, COTA/L 07/19/2015, 10:23 AM

## 2015-07-19 NOTE — Progress Notes (Signed)
Physical Therapy Treatment Patient Details Name: Juan Wiggins MRN: [id number] DOB: 1950/04/15 Today's Date: 07/19/2015    History of Present Illness pt presents with hx of recent spider bite and Septic R TKR, Septic Shock, PNA, and AKI.  pt with recent admit to hospital in [place] for R wrist Abscess.  pt with hx of Polysubstance Abuse, Hep C, Hep A, Depression, Anxiety, HTN, Tachycardia, Pancreatitis, Bil TKR, RA, Sleep Apnea, R Rotator Cuff Repair, Lumbar Lami, and Melanoma.  Pt s/p R I &D R knee with poly exchange and placement of antibiotic beads (5/23)    PT Comments    Pt c/o nausea and fatigue, but very agreeable to mobility with PT.  Pt able to initiate stair training in preparation for when he will be using the upstairs of his home, where the kitchen is located.  Pt lives on the ground floor and is able to have meals brought down to him.  Pt is making great progress and is ready for D/C from PT standpoint.  Will continue to follow while on acute.    Follow Up Recommendations  Home health PT;Supervision - Intermittent     Equipment Recommendations  None recommended by PT    Recommendations for Other Services       Precautions / Restrictions Precautions Precautions: Fall Precaution Comments: Order for KI, however pt performing SLR and declining to use it. Restrictions Weight Bearing Restrictions: Yes RLE Weight Bearing: Weight bearing as tolerated    Mobility  Bed Mobility Overal bed mobility: Needs Assistance             General bed mobility comments: in recliner upon arrival into room  Transfers Overall transfer level: Needs assistance Equipment used: Rolling walker (2 wheeled) Transfers: Sit to/from Stand Sit to Stand: Supervision         General transfer comment: Supervision for safety. Good demonstration of safe hand placement.  Ambulation/Gait Ambulation/Gait assistance: Supervision Ambulation Distance (Feet): 200 Feet Assistive device: Rolling  walker (2 wheeled) Gait Pattern/deviations: Step-to pattern;Decreased step length - left;Decreased stance time - right     General Gait Details: cues for decreasing step length on R and increasing on L to allow more equal gait pattern.  pt able to double his last ambulation distance.     Stairs Stairs: Yes Stairs assistance: Min guard Stair Management: Two rails;Step to pattern;Forwards Number of Stairs: 8 General stair comments: Initiated stair gait to allow pt to progress to going upstairs in his home when he is ready.  pt demonstrates good follow through on cues.    Wheelchair Mobility    Modified Rankin (Stroke Patients Only)       Balance Overall balance assessment: Needs assistance Sitting-balance support: No upper extremity supported;Feet supported Sitting balance-Leahy Scale: Good     Standing balance support: Single extremity supported;Bilateral upper extremity supported;During functional activity Standing balance-Leahy Scale: Fair                      Cognition Arousal/Alertness: Awake/alert Behavior During Therapy: WFL for tasks assessed/performed Overall Cognitive Status: Within Functional Limits for tasks assessed                      Exercises      General Comments        Pertinent Vitals/Pain Pain Assessment: No/denies pain    Home Living  Prior Function            PT Goals (current goals can now be found in the care plan section) Acute Rehab PT Goals Patient Stated Goal: Walk without pain. PT Goal Formulation: With patient Time For Goal Achievement: 07/29/15 Potential to Achieve Goals: Good Progress towards PT goals: Progressing toward goals    Frequency  Min 5X/week    PT Plan Current plan remains appropriate    Co-evaluation             End of Session Equipment Utilized During Treatment: Gait belt Activity Tolerance: Patient tolerated treatment well Patient left: in chair;with  call bell/phone within reach     Time: 1018-1039 PT Time Calculation (min) (ACUTE ONLY): 21 min  Charges:  $Gait Training: 8-22 mins                    G CodesCatarina Hartshorn, [place] 07/19/2015, 2:03 PM

## 2015-07-19 NOTE — Progress Notes (Signed)
Peripherally Inserted Central Catheter/Midline Placement  The IV Nurse has discussed with the patient and/or persons authorized to consent for the patient, the purpose of this procedure and the potential benefits and risks involved with this procedure.  The benefits include less needle sticks, lab draws from the catheter and patient may be discharged home with the catheter.  Risks include, but not limited to, infection, bleeding, blood clot (thrombus formation), and puncture of an artery; nerve damage and irregular heat beat.  Alternatives to this procedure were also discussed.  PICC/Midline Placement Documentation  PICC Single Lumen 07/19/15 PICC Right Cephalic 43 cm 0 cm (Active)  Indication for Insertion or Continuance of Line Home intravenous therapies (PICC only) 07/19/2015 10:20 PM  Exposed Catheter (cm) 0 cm 07/19/2015 10:20 PM  Site Assessment Clean;Dry;Intact 07/19/2015 10:20 PM  Line Status Blood return noted;Flushed;Saline locked 07/19/2015 10:20 PM  Dressing Type Transparent 07/19/2015 10:20 PM  Dressing Status Clean;Dry;Intact;Antimicrobial disc in place 07/19/2015 10:20 PM  Dressing Intervention New dressing 07/19/2015 10:20 PM  Dressing Change Due 07/26/15 07/19/2015 10:20 PM       Aldona Lento L 07/19/2015, 10:53 PM

## 2015-07-20 LAB — IMMUNOGLOBULINS A/E/G/M, SERUM
IGM, SERUM: 178 mg/dL — AB (ref 20–172)
IgA: 161 mg/dL (ref 61–437)
IgE (Immunoglobulin E), Serum: 45 IU/mL (ref 0–100)
IgG (Immunoglobin G), Serum: 670 mg/dL — ABNORMAL LOW (ref 700–1600)

## 2015-07-20 LAB — C DIFFICILE QUICK SCREEN W PCR REFLEX
C DIFFICILE (CDIFF) TOXIN: NEGATIVE
C DIFFICLE (CDIFF) ANTIGEN: NEGATIVE
C Diff interpretation: NEGATIVE

## 2015-07-20 LAB — COMPREHENSIVE METABOLIC PANEL
ALT: 31 U/L (ref 17–63)
AST: 29 U/L (ref 15–41)
Albumin: 2.3 g/dL — ABNORMAL LOW (ref 3.5–5.0)
Alkaline Phosphatase: 269 U/L — ABNORMAL HIGH (ref 38–126)
Anion gap: 10 (ref 5–15)
BUN: 23 mg/dL — ABNORMAL HIGH (ref 6–20)
CHLORIDE: 108 mmol/L (ref 101–111)
CO2: 22 mmol/L (ref 22–32)
CREATININE: 1.6 mg/dL — AB (ref 0.61–1.24)
Calcium: 9.7 mg/dL (ref 8.9–10.3)
GFR calc Af Amer: 51 mL/min — ABNORMAL LOW (ref >60)
GFR calc non Af Amer: 44 mL/min — ABNORMAL LOW (ref >60)
GLUCOSE: 92 mg/dL (ref 65–99)
Potassium: 4.5 mmol/L (ref 3.5–5.1)
SODIUM: 140 mmol/L (ref 135–145)
Total Bilirubin: 1.3 mg/dL — ABNORMAL HIGH (ref 0.3–1.2)
Total Protein: 5.6 g/dL — ABNORMAL LOW (ref 6.5–8.1)

## 2015-07-20 LAB — CBC WITH DIFFERENTIAL/PLATELET
BASOS ABS: 0.1 10*3/uL (ref 0.0–0.1)
BASOS PCT: 1 %
EOS ABS: 0.6 10*3/uL (ref 0.0–0.7)
Eosinophils Relative: 5 %
HCT: 28.3 % — ABNORMAL LOW (ref 39.0–52.0)
HEMOGLOBIN: 9.1 g/dL — AB (ref 13.0–17.0)
LYMPHS PCT: 22 %
Lymphs Abs: 2.4 10*3/uL (ref 0.7–4.0)
MCH: 29.5 pg (ref 26.0–34.0)
MCHC: 32.2 g/dL (ref 30.0–36.0)
MCV: 91.9 fL (ref 78.0–100.0)
MONOS PCT: 11 %
Monocytes Absolute: 1.2 10*3/uL — ABNORMAL HIGH (ref 0.1–1.0)
NEUTROS PCT: 61 %
Neutro Abs: 6.8 10*3/uL (ref 1.7–7.7)
PLATELETS: 209 10*3/uL (ref 150–400)
RBC: 3.08 MIL/uL — ABNORMAL LOW (ref 4.22–5.81)
RDW: 15.8 % — ABNORMAL HIGH (ref 11.5–15.5)
WBC: 11.1 10*3/uL — ABNORMAL HIGH (ref 4.0–10.5)

## 2015-07-20 LAB — HCV RNA QUANT
HCV QUANT LOG: 6.913 {Log_IU}/mL (ref >1.70)
HCV Quantitative: 8190000 IU/mL (ref >50)

## 2015-07-20 LAB — ANAEROBIC CULTURE

## 2015-07-20 MED ORDER — FLORANEX PO PACK
1.0000 g | PACK | Freq: Three times a day (TID) | ORAL | Status: DC
  Administered 2015-07-20 – 2015-07-21 (×4): 1 g via ORAL
  Filled 2015-07-20 (×5): qty 1

## 2015-07-20 MED ORDER — OXYCODONE HCL 5 MG PO TABS
10.0000 mg | ORAL_TABLET | ORAL | Status: DC | PRN
  Administered 2015-07-20 – 2015-07-21 (×4): 10 mg via ORAL
  Filled 2015-07-20 (×4): qty 2

## 2015-07-20 MED ORDER — LOPERAMIDE HCL 2 MG PO CAPS
4.0000 mg | ORAL_CAPSULE | Freq: Four times a day (QID) | ORAL | Status: DC
  Administered 2015-07-20 – 2015-07-21 (×5): 4 mg via ORAL
  Filled 2015-07-20 (×5): qty 2

## 2015-07-20 MED ORDER — DEXTROSE 5 % IV SOLN
2.0000 g | INTRAVENOUS | Status: DC
  Administered 2015-07-21: 2 g via INTRAVENOUS
  Filled 2015-07-20: qty 2

## 2015-07-20 MED ORDER — ASPIRIN 325 MG PO TABS
325.0000 mg | ORAL_TABLET | Freq: Two times a day (BID) | ORAL | Status: DC
  Administered 2015-07-20 – 2015-07-21 (×2): 325 mg via ORAL
  Filled 2015-07-20 (×3): qty 1

## 2015-07-20 NOTE — Progress Notes (Signed)
PT Cancellation Note  Patient Details Name: Juan Wiggins MRN: [id number] DOB: [birth year]   Cancelled Treatment:    Reason Eval/Treat Not Completed:  (Pt states he is still not feeling well and declined PT)  Will check back tomorrow to see if pt will participate.    Melvern Banker 07/20/2015, 11:37 AM  Lavonia Dana, PT  [phone] 07/20/2015

## 2015-07-20 NOTE — Progress Notes (Signed)
Patient ID: Juan Wiggins, male   DOB: May 22, 1950, 65 y.o.   MRN: [id number]    Subjective: 4 Days Post-Op Procedure(s) (LRB): IRRIGATION AND DEBRIDEMENT RIGHT KNEE WITH POLY EXCHANGE AND PLACEMENT OF ANTIBIOTIC BEADS (Right) Patient reports pain as 4 on 0-10 scale.   Denies CP or SOB.  Voiding without difficulty. Pt is currently having loose stools.  He thinks it is the Tylenol in Percocet Objective: Vital signs in last 24 hours: Temp:  [98.6 F (37 C)-99.1 F (37.3 C)] 98.7 F (37.1 C) (05/27 0924) Pulse Rate:  [58-66] 61 (05/27 0924) Resp:  [18] 18 (05/27 0924) BP: (144-169)/(66-75) 146/73 mmHg (05/27 0924) SpO2:  [94 %-98 %] 96 % (05/27 0924) Weight:  [95.936 kg (211 lb 8 oz)] 95.936 kg (211 lb 8 oz) (05/27 0414)  Intake/Output from previous day: 05/26 0701 - 05/27 0700 In: 1170 [P.O.:640; I.V.:30; IV Piggyback:500] Out: 750 [Urine:750] Intake/Output this shift: Total I/O In: 280 [P.O.:280] Out: -   Labs:  Recent Labs  07/18/15 0513 07/19/15 0450 07/20/15 0520  HGB 9.2* 9.0* 9.1*    Recent Labs  07/19/15 0450 07/19/15 1800 07/20/15 0520  WBC 9.6  --  11.1*  RBC 3.08* 3.23* 3.08*  HCT 28.4*  --  28.3*  PLT 173  --  209    Recent Labs  07/19/15 0450 07/20/15 0520  NA 140 140  K 4.6 4.5  CL 113* 108  CO2 20* 22  BUN 26* 23*  CREATININE 1.59* 1.60*  GLUCOSE 76 92  CALCIUM 9.3 9.7   No results for input(s): LABPT, INR in the last 72 hours.  Physical Exam: Neurologically intact ABD soft Sensation intact distally Incision: moderate drainage Compartment soft  Assessment/Plan: 4 Days Post-Op Procedure(s) (LRB): IRRIGATION AND DEBRIDEMENT RIGHT KNEE WITH POLY EXCHANGE AND PLACEMENT OF ANTIBIOTIC BEADS (Right) Up with therapy  Ordered dressing change Changed Percocet to Oxy IR per pt request Pt getting tested for cdiff  Jalal Rauch, Darla Lesches for Dr. Melina Schools [facility] [phone] 07/20/2015, 10:38 AM

## 2015-07-20 NOTE — Progress Notes (Signed)
PT Cancellation Note  Patient Details Name: Juan Wiggins MRN: [id number] DOB: 1950/10/02   Cancelled Treatment:    Reason Eval/Treat Not Completed:  (Pt declined. "rough Morning") Pt reports he has been having frequent trips to the restroom and just spilled coffee on himself and did not want PT this AM. He states he feeld comfortable with mobility and did practice stairs yesterday with PT. Of note-bloody drainage on ace bandage that pt stated was not thee before.  Will check back later if able to see if pt willing to participate.   Melvern Banker 07/20/2015, 8:59 AM Lavonia Dana, PT  [phone] 07/20/2015

## 2015-07-20 NOTE — Progress Notes (Signed)
Dr. Verlon Au wants to know which antibiotic is cheaper for home - Ampicillin or Ceftriaxone. Contacted Tiffany at [facility]. She spoke to the pharmacist at [facility] and it looks like the Ceftriaxone is cheaper. She stated that the pharmacist contacted Dr. Verlon Au.

## 2015-07-20 NOTE — Progress Notes (Signed)
65 y/o ? Mod-severe pulm htn on echo 05/2015 euvolemic CHF-diastolic Chr pain  Hypothyroid Hep C-never Rx DJD-Bilat Knee, L hip repairs Bipolar admitted 5.21.17 for septic shock  Hospitalized [facility]  April 2017 for Rt wrist cellulitis after tick bite  had Group A strep.  complicated by pneumonia and AKI requiring temporary dialysis.  [please note AI work-up dated 06/12/15 was done and was NEGATIVE]   He had dialysis catheter removed.   On admit fever 101F and severe Rt knee pain.  He had knee replacement by Dr. Gladstone Lighter several years ago.  He presented to ER. He was hypotensive with elevated WBC and lactic acid. He was given fluid but still hypotensive >> started on pressors.  Tx to floor 5/24, had c/o right knee pain and swelling on admission.  H/o bilateral TKAs.  Felt to be right septic knee, initially on vanco and cefepime-->ID saw 07/17/15-->Ampicillin Ortho took to OR for knee arthroscopy lavage and extensive synovectomy 5/21.  Fluid cx growing streptococcal group C as well as in his blood cultures (5/21)-->Rifampin 300 bi Went back to OR 5/23 for I&D and antibiotic beads.  Repeat fluid Cx 5/23 with rare streptococcal still pending, and blood cx's 5/25 pending.  ICU also mentioned bilateral lung opacities and right wrist cellulitis, which appears stable now.    Assessment:   Active Hospital Problems   Diagnosis Date Noted  . Severe sepsis ([place]) 07/14/2015  . Septic arthritis of knee, right ([place]) 07/18/2015  . Bacteremia due to Streptococcus 07/18/2015  . Cellulitis of right upper extremity 07/18/2015  . Hepatitis C 07/18/2015  . Anemia 07/18/2015  . Opacity of lung on imaging study, bilateral 07/18/2015  . Anxiety state 07/18/2015  . History of total knee arthroplasty 07/16/2015  . Chronic pain syndrome 05/21/2014  . Essential hypertension, benign 08/28/2013  . Major depression ([place]) 07/07/2013  . Alcohol dependency ([place]) 07/07/2013  . ARF (acute renal failure)  ([place]) 04/13/2011  . Hypothyroidism 04/13/2011    Resolved Hospital Problems   Diagnosis Date Noted Date Resolved  No resolved problems to display.   Doing fair today. Had 4 episodes of stool without blood so far Note that C. difficile is negative Eating and drinking No nausea no vomiting No chest pain currently   Plan:  Sepsis-septic knee, ?ossible concern for PNA given bilateral opacities.  - patient  improving, clinically looks good. - WBC fluctuating, ID is on board and has changed vanco/cefepime to just ampicillin--->Ceftriaxone 2gm until July7th [cheaper] + Rifampin 300 bid. -Case manager is aware to find out the price regarding ceftriaxone daily versus continuous ampicillin at home and let patient know so that we can make a decision tomorrow -Repeat cultures 5.25 NGTD. TTE without vegetations.  -Has had diarrhea 4 but C. difficile is negative -As there is no blood in the stool we will start Imodium 4 mg q4 scheduled -I have started lactobacillus to replenish gut flora  AKI requiring temp dialysis at OSH ATN - appears his renal dysfunction is all acute, he required temporary HD while in the ICU. He's had h/o of similar presentation in the past.  - BP is now starting to elevate, resume his propranolol. No lisinopril 2/2 to CKD PICC to be placed 07/20/2015 -Unlikely Autoimmune disease-defer as OP to PCP for Immunology/ allergy referral  Septic Knee c Strep Grp 'C' - Appreciate Ortho, has d/c'd hemovac and stable for discharge from their standpoint.  To follow up Next Friday 6/2.   Anemia critical illness - H/H remains stable,  received 2 units PRBC 5/22 preop  Hypothyroid state/sick euthyroid -  TSH 13--woul NOT adjust synthroid in settign acute illness--HPA axis will surely be affected bty stress of sepsis - PT on board - continue all else as is.   I examined the patient and reviewed the chart, labs and data. I discussed the patient's status and plan of care with  Patient  DVT prophylaxis:  None due to supposed allergy to heparin, on ASA BID Code Status: Full Code Family Communication:   None Disposition Plan:  Home pending final ID recs in 24 hr   Consultants:   Infectious disease, Dr. Baxter Flattery  Orthopedics, Dr. Sunday Corn. Gladstone Lighter  Procedures:   5/21 Arthroscopic lavage and synovectomy right knee  5/23 open arthrotomy, extensive synovectomy, removal of previous polyethylene insert and replacement with same size insert (4-10 mm thickness); antibiotic beads placed.  2D ECHO 5/23: Study Conclusions  - Left ventricle: The cavity size was normal. Wall thickness was  normal. Systolic function was normal. The estimated ejection  fraction was in the range of 60% to 65%. Wall motion was normal;  there were no regional wall motion abnormalities. - Aortic valve: Mildly calcified annulus. - Pulmonary arteries: Systolic pressure was moderately increased.  PA peak pressure: 57 mm Hg (S).   Antimicrobials:  Ampicillin 5/24-->6 weeks ending July 5th?  Rifampin 5/24--->July 5th  Cefepime 5/21-5/24  Vancomycin 5/21-5/24  Subjective:   Fair Walked comfortable from RR with no discomfort No cp No f  No chills no rigor   Objective:  Temp:  [98.1 F (36.7 C)-99.1 F (37.3 C)] 98.1 F (36.7 C) (05/27 1300) Pulse Rate:  [59-68] 68 (05/27 1300) Resp:  [16-18] 16 (05/27 1300) BP: (144-169)/(66-73) 144/71 mmHg (05/27 1300) SpO2:  [94 %-98 %] 98 % (05/27 1300) Weight:  [95.936 kg (211 lb 8 oz)] 95.936 kg (211 lb 8 oz) (05/27 0414) SpO2 Readings from Last 3 Encounters:  07/20/15 98%  12/05/14 98%  12/01/14 96%    Intake/Output Summary (Last 24 hours) at 07/20/15 1459 Last data filed at 07/20/15 0800  Gross per 24 hour  Intake    790 ml  Output    200 ml  Net    590 ml   Filed Weights   07/16/15 0500 07/18/15 0130 07/20/15 0414  Weight: 99.4 kg (219 lb 2.2 oz) 104.554 kg (230 lb 8 oz) 95.936 kg (211 lb 8 oz)   Body mass index  is 29.51 kg/(m^2).   Physical Exam  Constitutional: He is oriented to person, place, and time and well-developed, well-nourished, and in no distress. No distress.  Cardiovascular: Normal rate, regular rhythm and normal heart sounds.   Pulmonary/Chest: Effort normal and breath sounds normal. No respiratory distress. He has no wheezes.  Abdominal: Soft. Bowel sounds are normal. He exhibits no distension. There is no tenderness.  Musculoskeletal: He exhibits no edema or tenderness.  Neurological: He is alert and oriented to person, place, and time. GCS score is 15.  Skin: Skin is warm and dry. He is not diaphoretic. No erythema.  Right knee dressed, no erythema of right wrist    Medications:  Scheduled Meds: . ampicillin (OMNIPEN) IV  2 g Intravenous Q4H  . antiseptic oral rinse  7 mL Mouth Rinse q12n4p  . aspirin  325 mg Oral BID  . bupivacaine liposome  20 mL Infiltration Once  . cholecalciferol  3,000 Units Oral Daily  . ferrous sulfate  325 mg Oral BID WC  . lactobacillus  1 g Oral  TID WC  . levothyroxine  175 mcg Oral QAC breakfast  . OLANZapine-FLUoxetine  2 capsule Oral QHS  . propranolol ER  120 mg Oral QHS  . rifampin  300 mg Oral Q12H  . sodium chloride flush  10-40 mL Intracatheter Q12H  . sucralfate  1 g Oral TID WC & HS   Continuous Infusions: . sodium chloride 10 mL/hr at 07/17/15 1132   PRN Meds:   acetaminophen 650 mg Q6H PRN  ALPRAZolam 0.25 mg QHS PRN  HYDROmorphone (DILAUDID) injection 1 mg Q4H PRN  methocarbamol 500 mg Q6H PRN  ondansetron 4 mg Q6H PRN  oxyCODONE 10 mg Q4H PRN  sodium chloride flush 10-40 mL PRN    Labs:  CBC:  Recent Labs Lab 07/14/15 0830  07/16/15 0617 07/17/15 0629 07/18/15 0513 07/19/15 0450 07/20/15 0520  WBC 40.6*  < > 9.9 11.3* 13.3* 9.6 11.1*  NEUTROABS 39.4*  --  7.7 9.9*  --  6.1 6.8  HGB 9.3*  < > 9.4* 9.7* 9.2* 9.0* 9.1*  HCT 28.2*  < > 29.3* 30.3* 29.3* 28.4* 28.3*  MCV 96.2  < > 91.0 92.4 91.3 92.2 91.9  PLT  241  < > 136* 130* 170 173 209  < > = values in this interval not displayed.  CBG:  Recent Labs Lab 07/14/15 1248  GLUCAP 109*    Basic Metabolic Panel:  Recent Labs Lab 07/15/15 0330 07/16/15 0617 07/17/15 0629 07/17/15 1902 07/18/15 0513 07/19/15 0450 07/20/15 0520  GLUCOSE 135* 93 134* 133* 120*  120* 76 92  NA 140 140 138 139 140  139 140 140  K 5.5* 4.7 6.0* 5.3* 5.3*  5.3* 4.6 4.5  CL 114* 113* 113* 111 114*  113* 113* 108  CO2 22 21* 18* 21* 21*  20* 20* 22  BUN 31* 30* 26* 30* 31*  31* 26* 23*  CREATININE 2.53* 2.06* 1.67* 1.95* 1.82*  1.76* 1.59* 1.60*  CALCIUM 7.4* 8.0* 8.9 9.0 9.0  8.9 9.3 9.7  MG 1.3* 2.0 1.8  --  1.7 1.4*  --   PHOS  --  3.2 2.7  --  3.0 3.9  --     GFR: Estimated Creatinine Clearance: 55.8 mL/min (by C-G formula based on Cr of 1.6).  Liver Function Tests:  Recent Labs Lab 07/14/15 0830 07/14/15 1832 07/15/15 0330 07/16/15 0617 07/17/15 0629 07/18/15 0513 07/19/15 0450 07/20/15 0520  AST 30 25 21   --   --   --   --  29  ALT 19 22 21   --   --   --   --  31  ALKPHOS 57 68 61  --   --   --   --  269*  BILITOT 0.8 1.0 0.6  --   --   --   --  1.3*  PROT 5.1* 5.2* 4.8*  --   --   --   --  5.6*  ALBUMIN 2.7* 2.4* 2.1* 2.1* 2.2* 2.2* 2.3* 2.3*   Latest HbA1C/Lipids/Thyroid/Anemia: Lab Results  Component Value Date   TRIG 88 05/22/2014   TSH 13.834* 07/19/2015   IRON 23* 07/19/2015   TIBC 265 07/19/2015   FERRITIN 177 07/19/2015   VITAMINB12 383 07/19/2015   FOLATE 9.9 07/19/2015    Sepsis Labs: Lab Results  Component Value Date   CRP 6.5* 07/14/2015   LATICACIDVEN 2.0 07/14/2015   LATICACIDVEN 4.02* 07/14/2015      Radiology Studies: No results found.  Time spent: 35 minutes  Nita Sells, MD [facility]  If 7PM-7AM, please contact night-coverage [email or web address] Password [facility] 07/20/2015, 2:59 PM

## 2015-07-20 NOTE — Progress Notes (Signed)
INFECTIOUS DISEASE PROGRESS NOTE  ID: Juan Wiggins is a 65 y.o. male with  Principal Problem:   Severe sepsis ([place]) Active Problems:   ARF (acute renal failure) ([place])   Hypothyroidism   Alcohol dependency ([place])   Major depression ([place])   Essential hypertension, benign   Chronic pain syndrome   History of total knee arthroplasty   Septic arthritis of knee, right ([place])   Bacteremia due to Streptococcus   Cellulitis of right upper extremity   Hepatitis C   Anemia   Opacity of lung on imaging study, bilateral   Anxiety state  Subjective: 10 episodes of diarrhea since midnight.   Abtx:  Anti-infectives    Start     Dose/Rate Route Frequency Ordered Stop   07/18/15 1800  rifampin (RIFADIN) capsule 300 mg     300 mg Oral Every 12 hours 07/18/15 1340     07/17/15 2000  ampicillin (OMNIPEN) 2 g in sodium chloride 0.9 % 50 mL IVPB     2 g 150 mL/hr over 20 Minutes Intravenous Every 4 hours 07/17/15 1837     07/16/15 1800  vancomycin (VANCOCIN) IVPB 1000 mg/200 mL premix  Status:  Discontinued     1,000 mg 200 mL/hr over 60 Minutes Intravenous Every 12 hours 07/16/15 1756 07/16/15 1804   07/16/15 1431  gentamicin (GARAMYCIN) injection  Status:  Discontinued       As needed 07/16/15 1432 07/16/15 1518   07/16/15 1430  vancomycin (VANCOCIN) powder  Status:  Discontinued       As needed 07/16/15 1431 07/16/15 1518   07/16/15 1425  polymyxin B 500,000 Units, bacitracin 50,000 Units in sodium chloride irrigation 0.9 % 500 mL irrigation  Status:  Discontinued       As needed 07/16/15 1426 07/16/15 1518   07/15/15 0900  ceFEPIme (MAXIPIME) 1 g in dextrose 5 % 50 mL IVPB  Status:  Discontinued     1 g 100 mL/hr over 30 Minutes Intravenous Every 24 hours 07/14/15 1311 07/17/15 1837   07/15/15 0600  vancomycin (VANCOCIN) 1,250 mg in sodium chloride 0.9 % 250 mL IVPB  Status:  Discontinued     1,250 mg 166.7 mL/hr over 90 Minutes Intravenous Every 24 hours 07/14/15 1021 07/17/15 1837   07/14/15 0930  vancomycin (VANCOCIN) IVPB 1000 mg/200 mL premix     1,000 mg 200 mL/hr over 60 Minutes Intravenous  Once 07/14/15 0926 07/14/15 1126   07/14/15 0852  ceFEPIme (MAXIPIME) 2 g injection    Comments:  Collier [place]   : cabinet override      07/14/15 0852 07/14/15 2059   07/14/15 0845  ceFEPIme (MAXIPIME) 2 g in dextrose 5 % 50 mL IVPB     2 g 100 mL/hr over 30 Minutes Intravenous  Once 07/14/15 0839 07/14/15 1011      Medications:  Scheduled: . ampicillin (OMNIPEN) IV  2 g Intravenous Q4H  . antiseptic oral rinse  7 mL Mouth Rinse q12n4p  . aspirin  325 mg Oral BID  . bupivacaine liposome  20 mL Infiltration Once  . cholecalciferol  3,000 Units Oral Daily  . ferrous sulfate  325 mg Oral BID WC  . lactobacillus  1 g Oral TID WC  . levothyroxine  175 mcg Oral QAC breakfast  . loperamide  4 mg Oral Q6H  . OLANZapine-FLUoxetine  2 capsule Oral QHS  . propranolol ER  120 mg Oral QHS  . rifampin  300 mg Oral Q12H  .  sodium chloride flush  10-40 mL Intracatheter Q12H  . sucralfate  1 g Oral TID WC & HS    Objective: Vital signs in last 24 hours: Temp:  [98.1 F (36.7 C)-99.1 F (37.3 C)] 98.1 F (36.7 C) (05/27 1300) Pulse Rate:  [59-68] 68 (05/27 1300) Resp:  [16-18] 16 (05/27 1300) BP: (144-169)/(66-73) 144/71 mmHg (05/27 1300) SpO2:  [94 %-98 %] 98 % (05/27 1300) Weight:  [95.936 kg (211 lb 8 oz)] 95.936 kg (211 lb 8 oz) (05/27 0414)   General appearance: alert, cooperative and no distress Resp: clear to auscultation bilaterally Cardio: regular rate and rhythm GI: normal findings: bowel sounds normal and soft, non-tender Incision/Wound: R knee dressed. Normal light touch in foot.   Lab Results  Recent Labs  07/19/15 0450 07/20/15 0520  WBC 9.6 11.1*  HGB 9.0* 9.1*  HCT 28.4* 28.3*  NA 140 140  K 4.6 4.5  CL 113* 108  CO2 20* 22  BUN 26* 23*  CREATININE 1.59* 1.60*   Liver Panel  Recent Labs  07/19/15 0450 07/20/15 0520  PROT  --  5.6*    ALBUMIN 2.3* 2.3*  AST  --  29  ALT  --  31  ALKPHOS  --  269*  BILITOT  --  1.3*   Sedimentation Rate No results for input(s): ESRSEDRATE in the last 72 hours. C-Reactive Protein No results for input(s): CRP in the last 72 hours.  Microbiology: Recent Results (from the past 240 hour(s))  Culture, blood (Routine X 2) w Reflex to ID Panel     Status: Abnormal   Collection Time: 07/14/15  8:50 AM  Result Value Ref Range Status   Specimen Description BLOOD RIGHT FOREARM  Final   Special Requests BOTTLES DRAWN AEROBIC AND ANAEROBIC 5.5CC EACH  Final   Culture  Setup Time   Final    IN BOTH AEROBIC AND ANAEROBIC BOTTLES GRAM POSITIVE COCCI IN CHAINS CRITICAL RESULT CALLED TO, READ BACK BY AND VERIFIED WITH: TO VSMITH(RN) BY TCLEVELAND 07/15/15 AT 1:45AM    Culture STREPTOCOCCUS GROUP C (A)  Final   Report Status 07/17/2015 FINAL  Final   Organism ID, Bacteria STREPTOCOCCUS GROUP C  Final      Susceptibility   Streptococcus group c - MIC*    CLINDAMYCIN <=0.25 RESISTANT Resistant     AMPICILLIN <=0.25 SENSITIVE Sensitive     ERYTHROMYCIN 2 RESISTANT Resistant     VANCOMYCIN 0.5 SENSITIVE Sensitive     CEFTRIAXONE <=0.12 SENSITIVE Sensitive     LEVOFLOXACIN 0.5 SENSITIVE Sensitive     * STREPTOCOCCUS GROUP C  Blood Culture ID Panel (Reflexed)     Status: Abnormal   Collection Time: 07/14/15  8:50 AM  Result Value Ref Range Status   Enterococcus species NOT DETECTED NOT DETECTED Final   Vancomycin resistance NOT DETECTED NOT DETECTED Final   Listeria monocytogenes NOT DETECTED NOT DETECTED Final   Staphylococcus species NOT DETECTED NOT DETECTED Final   Staphylococcus aureus NOT DETECTED NOT DETECTED Final   Methicillin resistance NOT DETECTED NOT DETECTED Final   Streptococcus species DETECTED (A) NOT DETECTED Final    Comment: CRITICAL RESULT CALLED TO, READ BACK BY AND VERIFIED WITH: TO VSMITH (RN) BY [facility] 07/15/2015 AT 3:41AM    Streptococcus agalactiae NOT DETECTED  NOT DETECTED Final   Streptococcus pneumoniae NOT DETECTED NOT DETECTED Final   Streptococcus pyogenes NOT DETECTED NOT DETECTED Final   Acinetobacter baumannii NOT DETECTED NOT DETECTED Final   Enterobacteriaceae species NOT DETECTED  NOT DETECTED Final   Enterobacter cloacae complex NOT DETECTED NOT DETECTED Final   Escherichia coli NOT DETECTED NOT DETECTED Final   Klebsiella oxytoca NOT DETECTED NOT DETECTED Final   Klebsiella pneumoniae NOT DETECTED NOT DETECTED Final   Proteus species NOT DETECTED NOT DETECTED Final   Serratia marcescens NOT DETECTED NOT DETECTED Final   Carbapenem resistance NOT DETECTED NOT DETECTED Final   Haemophilus influenzae NOT DETECTED NOT DETECTED Final   Neisseria meningitidis NOT DETECTED NOT DETECTED Final   Pseudomonas aeruginosa NOT DETECTED NOT DETECTED Final   Candida albicans NOT DETECTED NOT DETECTED Final   Candida glabrata NOT DETECTED NOT DETECTED Final   Candida krusei NOT DETECTED NOT DETECTED Final   Candida parapsilosis NOT DETECTED NOT DETECTED Final   Candida tropicalis NOT DETECTED NOT DETECTED Final    Comment: Performed at [facility]  Culture, blood (Routine X 2) w Reflex to ID Panel     Status: Abnormal   Collection Time: 07/14/15  9:40 AM  Result Value Ref Range Status   Specimen Description BLOOD RIGHT ARM  Final   Special Requests BOTTLES DRAWN AEROBIC AND ANAEROBIC 5CC EACH  Final   Culture  Setup Time   Final    IN BOTH AEROBIC AND ANAEROBIC BOTTLES GRAM POSITIVE COCCI IN CHAINS CRITICAL RESULT CALLED TO, READ BACK BY AND VERIFIED WITH: TO VSMITH(RN) BY TCLEVELAND 07/15/2015 AT 1:33AM    Culture (A)  Final    STREPTOCOCCUS GROUP C SUSCEPTIBILITIES PERFORMED ON PREVIOUS CULTURE WITHIN THE LAST 5 DAYS. Performed at [facility]    Report Status 07/17/2015 FINAL  Final  Urine culture     Status: None   Collection Time: 07/14/15  9:40 AM  Result Value Ref Range Status   Specimen Description URINE,  CATHETERIZED  Final   Special Requests NONE  Final   Culture NO GROWTH Performed at [facility]   Final   Report Status 07/15/2015 FINAL  Final  Blood Culture ID Panel (Reflexed)     Status: Abnormal   Collection Time: 07/14/15  9:40 AM  Result Value Ref Range Status   Enterococcus species NOT DETECTED NOT DETECTED Final   Vancomycin resistance NOT DETECTED NOT DETECTED Final   Listeria monocytogenes NOT DETECTED NOT DETECTED Final   Staphylococcus species NOT DETECTED NOT DETECTED Final   Staphylococcus aureus NOT DETECTED NOT DETECTED Final   Methicillin resistance NOT DETECTED NOT DETECTED Final   Streptococcus species DETECTED (A) NOT DETECTED Final    Comment: CRITICAL RESULT CALLED TO, READ BACK BY AND VERIFIED WITH: TO VSMITH(RN) BY TCLEVELAND 07/15/15 AT 3:43AM    Streptococcus agalactiae NOT DETECTED NOT DETECTED Final   Streptococcus pneumoniae NOT DETECTED NOT DETECTED Final   Streptococcus pyogenes NOT DETECTED NOT DETECTED Final   Acinetobacter baumannii NOT DETECTED NOT DETECTED Final   Enterobacteriaceae species NOT DETECTED NOT DETECTED Final   Enterobacter cloacae complex NOT DETECTED NOT DETECTED Final   Escherichia coli NOT DETECTED NOT DETECTED Final   Klebsiella oxytoca NOT DETECTED NOT DETECTED Final   Klebsiella pneumoniae NOT DETECTED NOT DETECTED Final   Proteus species NOT DETECTED NOT DETECTED Final   Serratia marcescens NOT DETECTED NOT DETECTED Final   Carbapenem resistance NOT DETECTED NOT DETECTED Final   Haemophilus influenzae NOT DETECTED NOT DETECTED Final   Neisseria meningitidis NOT DETECTED NOT DETECTED Final   Pseudomonas aeruginosa NOT DETECTED NOT DETECTED Final   Candida albicans NOT DETECTED NOT DETECTED Final   Candida glabrata NOT DETECTED  NOT DETECTED Final   Candida krusei NOT DETECTED NOT DETECTED Final   Candida parapsilosis NOT DETECTED NOT DETECTED Final   Candida tropicalis NOT DETECTED NOT DETECTED Final    Comment:  Performed at [facility]  MRSA PCR Screening     Status: None   Collection Time: 07/14/15 12:57 PM  Result Value Ref Range Status   MRSA by PCR NEGATIVE NEGATIVE Final    Comment:        The GeneXpert MRSA Assay (FDA approved for NASAL specimens only), is one component of a comprehensive MRSA colonization surveillance program. It is not intended to diagnose MRSA infection nor to guide or monitor treatment for MRSA infections.   Culture, body fluid-bottle     Status: Abnormal   Collection Time: 07/14/15  2:00 PM  Result Value Ref Range Status   Specimen Description FLUID RIGHT KNEE  Final   Special Requests NONE  Final   Gram Stain   Final    GRAM POSITIVE COCCI IN BOTH AEROBIC AND ANAEROBIC BOTTLES CRITICAL RESULT CALLED TO, READ BACK BY AND VERIFIED WITH: RN Marlou Porch AT [id number] MARTINB    Culture STREPTOCOCCUS GROUP C (A)  Final   Report Status 07/16/2015 FINAL  Final   Organism ID, Bacteria STREPTOCOCCUS GROUP C  Final      Susceptibility   Streptococcus group c - MIC*    CLINDAMYCIN <=0.25 RESISTANT Resistant     AMPICILLIN <=0.25 SENSITIVE Sensitive     ERYTHROMYCIN 2 RESISTANT Resistant     VANCOMYCIN 0.5 SENSITIVE Sensitive     CEFTRIAXONE <=0.12 SENSITIVE Sensitive     LEVOFLOXACIN 0.5 SENSITIVE Sensitive     * STREPTOCOCCUS GROUP C  Gram stain     Status: None   Collection Time: 07/14/15  2:00 PM  Result Value Ref Range Status   Specimen Description FLUID RIGHT KNEE  Final   Special Requests NONE  Final   Gram Stain   Final    WBC PRESENT, PREDOMINANTLY PMN ABUNDANT GRAM POSITIVE COCCI IN CHAINS CONFIRMED BY B.MARTIN    Report Status 07/14/2015 FINAL  Final  Anaerobic culture     Status: None   Collection Time: 07/14/15  4:58 PM  Result Value Ref Range Status   Specimen Description OTHER  Final   Special Requests RIGHT KNEE JOINT  Final   Gram Stain   Final    MODERATE WBC PRESENT,BOTH PMN AND MONONUCLEAR NO SQUAMOUS EPITHELIAL CELLS  SEEN FEW GRAM POSITIVE COCCI IN PAIRS Performed at [facility]    Culture   Final    NO ANAEROBES ISOLATED Performed at [facility]    Report Status 07/19/2015 FINAL  Final  Anaerobic culture     Status: None   Collection Time: 07/16/15  2:35 PM  Result Value Ref Range Status   Specimen Description FLUID SYNOVIAL RIGHT KNEE  Final   Special Requests SWAB PT ON VANC  Final   Gram Stain   Final    MODERATE WBC PRESENT,BOTH PMN AND MONONUCLEAR NO ORGANISMS SEEN    Culture NO ANAEROBES ISOLATED  Final   Report Status 07/20/2015 FINAL  Final  Body fluid culture     Status: None   Collection Time: 07/16/15  2:35 PM  Result Value Ref Range Status   Specimen Description FLUID SYNOVIAL RIGHT KNEE  Final   Special Requests SWAB PT ON VANC  Final   Gram Stain   Final    MODERATE WBC PRESENT,BOTH PMN AND  MONONUCLEAR NO ORGANISMS SEEN    Culture   Final    RARE STREPTOCOCCUS GROUP C CRITICAL RESULT CALLED TO, READ BACK BY AND VERIFIED WITH: KDoralee Albino AT 1208 ON [id number] BY Rhea Bleacher    Report Status 07/19/2015 FINAL  Final   Organism ID, Bacteria STREPTOCOCCUS GROUP C  Final      Susceptibility   Streptococcus group c - MIC*    CLINDAMYCIN <=0.25 RESISTANT Resistant     AMPICILLIN <=0.25 SENSITIVE Sensitive     ERYTHROMYCIN 2 RESISTANT Resistant     VANCOMYCIN 0.5 SENSITIVE Sensitive     CEFTRIAXONE <=0.12 SENSITIVE Sensitive     LEVOFLOXACIN 0.5 SENSITIVE Sensitive     * RARE STREPTOCOCCUS GROUP C  Culture, blood (Routine X 2) w Reflex to ID Panel     Status: None (Preliminary result)   Collection Time: 07/18/15  5:22 AM  Result Value Ref Range Status   Specimen Description BLOOD RIGHT ANTECUBITAL  Final   Special Requests IN PEDIATRIC BOTTLE 2CC  Final   Culture NO GROWTH 2 DAYS  Final   Report Status PENDING  Incomplete  Culture, blood (Routine X 2) w Reflex to ID Panel     Status: None (Preliminary result)   Collection Time: 07/18/15  5:28 AM  Result  Value Ref Range Status   Specimen Description BLOOD RIGHT HAND  Final   Special Requests IN PEDIATRIC BOTTLE 3CC  Final   Culture NO GROWTH 2 DAYS  Final   Report Status PENDING  Incomplete  C difficile quick scan w PCR reflex     Status: None   Collection Time: 07/20/15 11:52 AM  Result Value Ref Range Status   C Diff antigen NEGATIVE NEGATIVE Final   C Diff toxin NEGATIVE NEGATIVE Final   C Diff interpretation Negative for toxigenic C. difficile  Final    Studies/Results: No results found.   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  Will hold atbx til am and then start ceftriaxone.  Suspect Amp as cause of diarrhea.          Bobby Rumpf Infectious Diseases (pager) [phone] [email or web address] 07/20/2015, 3:14 PM  LOS: 6 days

## 2015-07-21 LAB — HEPATITIS C GENOTYPE

## 2015-07-21 MED ORDER — OXYCODONE HCL 10 MG PO TABS: 10.0000 mg | ORAL_TABLET | ORAL | Status: DC | PRN

## 2015-07-21 MED ORDER — METHOCARBAMOL 500 MG PO TABS: 500.0000 mg | ORAL_TABLET | Freq: Four times a day (QID) | ORAL | Status: DC | PRN

## 2015-07-21 MED ORDER — FERROUS SULFATE 325 (65 FE) MG PO TABS: 325.0000 mg | ORAL_TABLET | Freq: Two times a day (BID) | ORAL | Status: DC

## 2015-07-21 MED ORDER — ASPIRIN 325 MG PO TABS: 325.0000 mg | ORAL_TABLET | Freq: Two times a day (BID) | ORAL | Status: DC

## 2015-07-21 MED ORDER — DEXTROSE 5 % IV SOLN: 2.0000 g | INTRAVENOUS | Status: DC

## 2015-07-21 MED ORDER — RIFAMPIN 300 MG PO CAPS: 300.0000 mg | ORAL_CAPSULE | Freq: Two times a day (BID) | ORAL | Status: DC

## 2015-07-21 MED ORDER — LOPERAMIDE HCL 2 MG PO CAPS: 4.0000 mg | ORAL_CAPSULE | Freq: Four times a day (QID) | ORAL | Status: DC

## 2015-07-21 NOTE — Discharge Summary (Signed)
Physician Discharge Summary  Juan Wiggins VWU:[id number] DOB: 05-Jul-1950 DOA: 07/14/2015  PCP: Juan Rima, MD  Admit date: 07/14/2015 Discharge date: 07/21/2015  Time spent: 45 minutes  Recommendations for Outpatient Follow-up:  1. Follow with Dr. Gladstone Wiggins June 2nd 2. ASA 325 bid to prevent DVT--tbd as OP when to swtich over to 81 mg [usually 4 weeks] 3. Pain meds given this admit as hard script 4. PICC to be d/c by home health at end of therapies 5. immodium short term then d/c in 2-3 days form d/c home 6. Consider OP immunologist referral and work-up--there was concern raised this admission re: Immune defect or deficit and certainly Hep C can lower his overall immunity 7. Check TSH in 3 weeks--sepsis can alter HPA-Pituitary axis 8. Should follow-up at [facility] for consideration of Hep C Rx if can be compliant with no IVDU and other issues  9. Ceftriaxone 2 gm qd/Rifampin 300 bid until 08/30/15 [facility] Care Per Protocol:  Labs weekly while on IV antibiotics: x__ CBC with differential _x_ CMP _x_ CRP- at end of tx _x_ ESR at end of tx  Fax weekly labs to [phone]  Clinic Follow Up Appt: 6 wk  Discharge Diagnoses:  Principal Problem:   Severe sepsis ([place]) Active Problems:   ARF (acute renal failure) ([place])   Hypothyroidism   Alcohol dependency ([place])   Major depression ([place])   Essential hypertension, benign   Chronic pain syndrome   History of total knee arthroplasty   Septic arthritis of knee, right ([place])   Bacteremia due to Streptococcus   Cellulitis of right upper extremity   Hepatitis C   Anemia   Opacity of lung on imaging study, bilateral   Anxiety state   Discharge Condition: improved  Diet recommendation:  hh low salt  Filed Weights   07/18/15 0130 07/20/15 0414 07/21/15 0416  Weight: 104.554 kg (230 lb 8 oz) 95.936 kg (211 lb 8 oz) 94.575 kg (208 lb 8 oz)    History of present illness:  65 y/o ? Mod-severe pulm htn on echo 05/2015 euvolemic  CHF-diastolic Chr pain  Hypothyroid Hep C-never Rx DJD-Bilat Knee, L hip repairs Bipolar admitted 5.21.17 for septic shock  Hospitalized [facility] April 2017 for Rt wrist cellulitis after tick bite had Group A strep.  complicated by pneumonia and AKI requiring temporary dialysis. [please note AI work-up dated 06/12/15 was done and was NEGATIVE]  He had dialysis catheter removed.   On admit fever 101F and severe Rt knee pain. He had knee replacement by Dr. Gladstone Wiggins several years ago.  He presented to ER. He was hypotensive with elevated WBC and lactic acid. He was given fluid but still hypotensive--> started on pressors.  Tx to floor 5/24, had c/o right knee pain and swelling on admission.  H/o bilateral TKAs.  Felt to be right septic knee, initially on vanco and cefepime-->ID saw 07/17/15-->Ampicillin Ortho took to OR for knee arthroscopy lavage and extensive synovectomy 5/21--Had Antibiotics beads implanted as well Went back to OR 5/23 for I&D and antibiotic beads.  Fluid cx growing streptococcal group C as well as in his blood cultures (5/21)-->Rifampin 300 bid  Repeat fluid Cx 5/23 with rare streptococcal still pending, and blood cx's 5/25 NGTD ICU also mentioned bilateral lung opacities and right wrist cellulitis--NO contributory  Hospital Course:   Sepsis-septic knee, Possible concern for PNA given bilateral opacities.  - patient improving, clinically looks good. - WBC fluctuating, ID is on board and has changed vanco/cefepime to just ampicillin--->Ceftriaxone  2gm until [employer] and didn't casue diarrhea] + Rifampin 300 bid. -Repeat cultures 5.25 NGTD. TTE without vegetations.  -Has had diarrhea 4 but C. difficile is negative -As there is no blood in the stool we will start Imodium 4 mg q4 scheduled for short term  AKI requiring temp dialysis at OSH ATN - appears his renal dysfunction is all acute, he required temporary HD while in the ICU. He's had  h/o of similar presentation in the past.  - BP is now starting to elevate, resume his propranolol. No lisinopril 2/2 to CKD PICC placed 07/20/2015 -Unlikely Autoimmune disease-defer as OP to PCP for Immunology/ allergy referral  Septic Knee c Strep Grp 'C' - Appreciate Ortho, has d/c'd hemovac and stable for discharge from their standpoint. To follow up Next Friday 6/2.   Anemia critical illness - H/H remains stable, received 2 units PRBC 5/22 preop -Iron as OP  Hypothyroid state/sick euthyroid - TSH 13--woul NOT adjust synthroid in settign acute illness--HPA axis will surely be affected bty stress of sepsis - PT on board - continue all else as is.   I examined the patient and reviewed the chart, labs and data. I discussed the patient's status and plan of care with Patient  Procedures: 5.21.17 PROCEDURE: Arthroscopic lavage and synovectomy right knee SURGEON: Supple, Metta Clines. M.D. (i.e. Studies not automatically included, echos, thoracentesis, etc; not x-rays)  5/23 PROCEDURE: Procedure(s): IRRIGATION AND DEBRIDEMENT RIGHT KNEE WITH POLY EXCHANGE AND PLACEMENT OF ANTIBIOTIC BEADS (Right)   Consultations:  0rthio  ID  Discharge Exam: Filed Vitals:   07/21/15 0416 07/21/15 0941  BP: 150/77 147/73  Pulse: 63 60  Temp: 98.2 F (36.8 C) 98.1 F (36.7 C)  Resp: 16 18    General: alert conversant No diarr eomi ncat Cardiovascular: eating drinking some LE pain No cp Respiratory: clear no added sound  Discharge Instructions   Discharge Instructions    Diet - low sodium heart healthy    Complete by:  As directed      Increase activity slowly    Complete by:  As directed           Current Discharge Medication List    START taking these medications   Details  aspirin 325 MG tablet Take 1 tablet (325 mg total) by mouth 2 (two) times daily. Qty: 60 tablet, Refills: 0    cefTRIAXone 2 g in dextrose 5 % 50 mL Inject 2 g into the vein daily. Until July 7th,  2017 Qty: 2 g, Refills: 0    ferrous sulfate 325 (65 FE) MG tablet Take 1 tablet (325 mg total) by mouth 2 (two) times daily with a meal. Qty: 60 tablet, Refills: 2    loperamide (IMODIUM) 2 MG capsule Take 2 capsules (4 mg total) by mouth every 6 (six) hours. Qty: 30 capsule, Refills: 0    methocarbamol (ROBAXIN) 500 MG tablet Take 1 tablet (500 mg total) by mouth every 6 (six) hours as needed for muscle spasms. Qty: 20 tablet, Refills: 0    oxyCODONE 10 MG TABS Take 1 tablet (10 mg total) by mouth every 4 (four) hours as needed for severe pain. Qty: 30 tablet, Refills: 0    rifampin (RIFADIN) 300 MG capsule Take 1 capsule (300 mg total) by mouth every 12 (twelve) hours. Qty: 39 capsule, Refills: 0      CONTINUE these medications which have NOT CHANGED   Details  acetaminophen (TYLENOL) 325 MG tablet Take 650 mg by mouth every  6 (six) hours as needed for mild pain or moderate pain.    albuterol (PROVENTIL HFA;VENTOLIN HFA) 108 (90 Base) MCG/ACT inhaler Inhale 2-4 puffs into the lungs daily as needed.    ALPRAZolam (XANAX) 0.25 MG tablet Take 0.25 mg by mouth at bedtime as needed for anxiety or sleep.     cholecalciferol (VITAMIN D) 1000 UNITS tablet Take 3,000 Units by mouth daily.    dicyclomine (BENTYL) 20 MG tablet Take 1 tablet (20 mg total) by mouth 2 (two) times daily. Qty: 20 tablet, Refills: 0    escitalopram (LEXAPRO) 20 MG tablet Take 20 mg by mouth at bedtime.     ibuprofen (ADVIL,MOTRIN) 200 MG tablet Take 400 mg by mouth every 6 (six) hours as needed for mild pain.    levothyroxine (SYNTHROID, LEVOTHROID) 175 MCG tablet Take 175 mcg by mouth at bedtime.     lisinopril (PRINIVIL,ZESTRIL) 20 MG tablet Take 20 mg by mouth daily with breakfast.    OLANZapine-FLUoxetine (SYMBYAX) 12-50 MG capsule Take 1 capsule by mouth every evening.    OVER THE COUNTER MEDICATION Wetting solution: Instill into both eyes daily as needed (wears contacts)    oxaprozin (DAYPRO) 600  MG tablet Take 1 tablet by mouth 2 (two) times daily. Refills: 0    promethazine (PHENERGAN) 12.5 MG tablet Take 12.5-25 mg by mouth every 6 (six) hours as needed for nausea or vomiting.     propranolol ER (INDERAL LA) 60 MG 24 hr capsule Take 2 capsules by mouth at bedtime.    sucralfate (CARAFATE) 1 G tablet Take 1 tablet (1 g total) by mouth 4 (four) times daily -  with meals and at bedtime. Qty: 10 tablet, Refills: 0    valACYclovir (VALTREX) 1000 MG tablet Take 1,000 mg by mouth daily as needed (for flares).  Refills: 2      STOP taking these medications     cephALEXin (KEFLEX) 500 MG capsule      naproxen sodium (ALEVE) 220 MG tablet      predniSONE (DELTASONE) 10 MG tablet        Allergies  Allergen Reactions  . Heparin Other (See Comments)    Low platelets (130s), no HIT testing performed, platelets recovered.   Follow-up Information    Follow up with GIOFFRE,RONALD A, MD. Schedule an appointment as soon as possible for a visit on 07/26/2015.   Specialty:  Orthopedic Surgery   Contact information:   [address] [place] [address] [phone]       Follow up with [facility].   Why:  They will contact you to schedule home therapy visits.   Contact information:   [place] [address] [phone]        The results of significant diagnostics from this hospitalization (including imaging, microbiology, ancillary and laboratory) are listed below for reference.    Significant Diagnostic Studies: Dg Chest Port 1 View  07/14/2015  CLINICAL DATA:  65 year old with recent hospitalization for sepsis, immediate postop lavage and synovectomy of the right knee for a septic arthroplasty. Central venous catheter placement at bedside. EXAM: PORTABLE CHEST 1 VIEW 4:40 p.m.: COMPARISON:  Portable chest x-ray earlier today 9:19 a.m. and previously. FINDINGS: Left subclavian central venous catheter tip projects over the  expected location of the junction of the innominate vein and SVC. No evidence of pneumothorax or mediastinal hematoma. Cardiac silhouette normal in size for technique. Lungs clear. Bronchovascular markings normal. Pulmonary vascularity normal. No visible  pleural effusions. IMPRESSION: 1. Left subclavian central venous catheter tip projects at the expected location of the junction of the innominate vein and SVC. No acute complicating features. 2.  No acute cardiopulmonary disease. Electronically Signed   By: Evangeline Dakin M.D.   On: 07/14/2015 17:11   Dg Chest Port 1 View  07/14/2015  CLINICAL DATA:  Fever.  Code sepsis. EXAM: PORTABLE CHEST 1 VIEW COMPARISON:  10/22/2014 chest radiograph. FINDINGS: Stable cardiomediastinal silhouette with normal heart size. No pneumothorax. No pleural effusion. There are new patchy hazy opacities throughout the parahilar lungs bilaterally. IMPRESSION: New patchy hazy opacities throughout the bilateral parahilar lungs, nonspecific, consider atypical infection. Electronically Signed   By: Ilona Sorrel M.D.   On: 07/14/2015 10:08   Dg Knee Complete 4 Views Right  07/14/2015  CLINICAL DATA:  Felt right knee pop out of place while stepping down, with right knee pain. Initial encounter. EXAM: RIGHT KNEE - COMPLETE 4+ VIEW COMPARISON:  None. FINDINGS: There is no evidence of fracture or dislocation. The patient's total knee arthroplasty is grossly unremarkable in appearance. The joint spaces are preserved. No significant degenerative change is seen; the patellofemoral joint is grossly unremarkable in appearance. No significant joint effusion is seen. The visualized soft tissues are normal in appearance. IMPRESSION: 1. No evidence of fracture or dislocation. 2. Total knee arthroplasty is grossly unremarkable in appearance. Electronically Signed   By: Garald Balding M.D.   On: 07/14/2015 03:19   Dg Hip Unilat With Pelvis 2-3 Views Right  07/14/2015  CLINICAL DATA:  Stepped down  and felt right knee pop out of place. Right hip pain. Initial encounter. EXAM: DG HIP (WITH OR WITHOUT PELVIS) 2-3V RIGHT COMPARISON:  None. FINDINGS: There is no evidence of fracture or dislocation. Both femoral heads are seated normally within their respective acetabula. The proximal right femur appears intact. No significant degenerative change is appreciated. The sacroiliac joints are unremarkable in appearance. The visualized bowel gas pattern is grossly unremarkable in appearance. IMPRESSION: No evidence of fracture or dislocation. Electronically Signed   By: Garald Balding M.D.   On: 07/14/2015 03:20    Microbiology: Recent Results (from the past 240 hour(s))  Culture, blood (Routine X 2) w Reflex to ID Panel     Status: Abnormal   Collection Time: 07/14/15  8:50 AM  Result Value Ref Range Status   Specimen Description BLOOD RIGHT FOREARM  Final   Special Requests BOTTLES DRAWN AEROBIC AND ANAEROBIC 5.5CC EACH  Final   Culture  Setup Time   Final    IN BOTH AEROBIC AND ANAEROBIC BOTTLES GRAM POSITIVE COCCI IN CHAINS CRITICAL RESULT CALLED TO, READ BACK BY AND VERIFIED WITH: TO VSMITH(RN) BY [facility] 07/15/15 AT 1:45AM    Culture STREPTOCOCCUS GROUP C (A)  Final   Report Status 07/17/2015 FINAL  Final   Organism ID, Bacteria STREPTOCOCCUS GROUP C  Final      Susceptibility   Streptococcus group c - MIC*    CLINDAMYCIN <=0.25 RESISTANT Resistant     AMPICILLIN <=0.25 SENSITIVE Sensitive     ERYTHROMYCIN 2 RESISTANT Resistant     VANCOMYCIN 0.5 SENSITIVE Sensitive     CEFTRIAXONE <=0.12 SENSITIVE Sensitive     LEVOFLOXACIN 0.5 SENSITIVE Sensitive     * STREPTOCOCCUS GROUP C  Blood Culture ID Panel (Reflexed)     Status: Abnormal   Collection Time: 07/14/15  8:50 AM  Result Value Ref Range Status   Enterococcus species NOT DETECTED NOT DETECTED Final  Vancomycin resistance NOT DETECTED NOT DETECTED Final   Listeria monocytogenes NOT DETECTED NOT DETECTED Final   Staphylococcus  species NOT DETECTED NOT DETECTED Final   Staphylococcus aureus NOT DETECTED NOT DETECTED Final   Methicillin resistance NOT DETECTED NOT DETECTED Final   Streptococcus species DETECTED (A) NOT DETECTED Final    Comment: CRITICAL RESULT CALLED TO, READ BACK BY AND VERIFIED WITH: TO VSMITH (RN) BY [facility] 07/15/2015 AT 3:41AM    Streptococcus agalactiae NOT DETECTED NOT DETECTED Final   Streptococcus pneumoniae NOT DETECTED NOT DETECTED Final   Streptococcus pyogenes NOT DETECTED NOT DETECTED Final   Acinetobacter baumannii NOT DETECTED NOT DETECTED Final   Enterobacteriaceae species NOT DETECTED NOT DETECTED Final   Enterobacter cloacae complex NOT DETECTED NOT DETECTED Final   Escherichia coli NOT DETECTED NOT DETECTED Final   Klebsiella oxytoca NOT DETECTED NOT DETECTED Final   Klebsiella pneumoniae NOT DETECTED NOT DETECTED Final   Proteus species NOT DETECTED NOT DETECTED Final   Serratia marcescens NOT DETECTED NOT DETECTED Final   Carbapenem resistance NOT DETECTED NOT DETECTED Final   Haemophilus influenzae NOT DETECTED NOT DETECTED Final   Neisseria meningitidis NOT DETECTED NOT DETECTED Final   Pseudomonas aeruginosa NOT DETECTED NOT DETECTED Final   Candida albicans NOT DETECTED NOT DETECTED Final   Candida glabrata NOT DETECTED NOT DETECTED Final   Candida krusei NOT DETECTED NOT DETECTED Final   Candida parapsilosis NOT DETECTED NOT DETECTED Final   Candida tropicalis NOT DETECTED NOT DETECTED Final    Comment: Performed at [facility]  Culture, blood (Routine X 2) w Reflex to ID Panel     Status: Abnormal   Collection Time: 07/14/15  9:40 AM  Result Value Ref Range Status   Specimen Description BLOOD RIGHT ARM  Final   Special Requests BOTTLES DRAWN AEROBIC AND ANAEROBIC 5CC EACH  Final   Culture  Setup Time   Final    IN BOTH AEROBIC AND ANAEROBIC BOTTLES GRAM POSITIVE COCCI IN CHAINS CRITICAL RESULT CALLED TO, READ BACK BY AND VERIFIED WITH: TO VSMITH(RN)  BY [facility] 07/15/2015 AT 1:33AM    Culture (A)  Final    STREPTOCOCCUS GROUP C SUSCEPTIBILITIES PERFORMED ON PREVIOUS CULTURE WITHIN THE LAST 5 DAYS. Performed at [facility]    Report Status 07/17/2015 FINAL  Final  Urine culture     Status: None   Collection Time: 07/14/15  9:40 AM  Result Value Ref Range Status   Specimen Description URINE, CATHETERIZED  Final   Special Requests NONE  Final   Culture NO GROWTH Performed at [facility]   Final   Report Status 07/15/2015 FINAL  Final  Blood Culture ID Panel (Reflexed)     Status: Abnormal   Collection Time: 07/14/15  9:40 AM  Result Value Ref Range Status   Enterococcus species NOT DETECTED NOT DETECTED Final   Vancomycin resistance NOT DETECTED NOT DETECTED Final   Listeria monocytogenes NOT DETECTED NOT DETECTED Final   Staphylococcus species NOT DETECTED NOT DETECTED Final   Staphylococcus aureus NOT DETECTED NOT DETECTED Final   Methicillin resistance NOT DETECTED NOT DETECTED Final   Streptococcus species DETECTED (A) NOT DETECTED Final    Comment: CRITICAL RESULT CALLED TO, READ BACK BY AND VERIFIED WITH: TO VSMITH(RN) BY [facility] 07/15/15 AT 3:43AM    Streptococcus agalactiae NOT DETECTED NOT DETECTED Final   Streptococcus pneumoniae NOT DETECTED NOT DETECTED Final   Streptococcus pyogenes NOT DETECTED NOT DETECTED Final   Acinetobacter baumannii NOT DETECTED  NOT DETECTED Final   Enterobacteriaceae species NOT DETECTED NOT DETECTED Final   Enterobacter cloacae complex NOT DETECTED NOT DETECTED Final   Escherichia coli NOT DETECTED NOT DETECTED Final   Klebsiella oxytoca NOT DETECTED NOT DETECTED Final   Klebsiella pneumoniae NOT DETECTED NOT DETECTED Final   Proteus species NOT DETECTED NOT DETECTED Final   Serratia marcescens NOT DETECTED NOT DETECTED Final   Carbapenem resistance NOT DETECTED NOT DETECTED Final   Haemophilus influenzae NOT DETECTED NOT DETECTED Final   Neisseria meningitidis NOT  DETECTED NOT DETECTED Final   Pseudomonas aeruginosa NOT DETECTED NOT DETECTED Final   Candida albicans NOT DETECTED NOT DETECTED Final   Candida glabrata NOT DETECTED NOT DETECTED Final   Candida krusei NOT DETECTED NOT DETECTED Final   Candida parapsilosis NOT DETECTED NOT DETECTED Final   Candida tropicalis NOT DETECTED NOT DETECTED Final    Comment: Performed at [facility]  MRSA PCR Screening     Status: None   Collection Time: 07/14/15 12:57 PM  Result Value Ref Range Status   MRSA by PCR NEGATIVE NEGATIVE Final    Comment:        The GeneXpert MRSA Assay (FDA approved for NASAL specimens only), is one component of a comprehensive MRSA colonization surveillance program. It is not intended to diagnose MRSA infection nor to guide or monitor treatment for MRSA infections.   Culture, body fluid-bottle     Status: Abnormal   Collection Time: 07/14/15  2:00 PM  Result Value Ref Range Status   Specimen Description FLUID RIGHT KNEE  Final   Special Requests NONE  Final   Gram Stain   Final    GRAM POSITIVE COCCI IN BOTH AEROBIC AND ANAEROBIC BOTTLES CRITICAL RESULT CALLED TO, READ BACK BY AND VERIFIED WITH: RN Marlou Porch AT 2044 [id number] MARTINB    Culture STREPTOCOCCUS GROUP C (A)  Final   Report Status 07/16/2015 FINAL  Final   Organism ID, Bacteria STREPTOCOCCUS GROUP C  Final      Susceptibility   Streptococcus group c - MIC*    CLINDAMYCIN <=0.25 RESISTANT Resistant     AMPICILLIN <=0.25 SENSITIVE Sensitive     ERYTHROMYCIN 2 RESISTANT Resistant     VANCOMYCIN 0.5 SENSITIVE Sensitive     CEFTRIAXONE <=0.12 SENSITIVE Sensitive     LEVOFLOXACIN 0.5 SENSITIVE Sensitive     * STREPTOCOCCUS GROUP C  Gram stain     Status: None   Collection Time: 07/14/15  2:00 PM  Result Value Ref Range Status   Specimen Description FLUID RIGHT KNEE  Final   Special Requests NONE  Final   Gram Stain   Final    WBC PRESENT, PREDOMINANTLY PMN ABUNDANT GRAM POSITIVE COCCI IN  CHAINS CONFIRMED BY B.MARTIN    Report Status 07/14/2015 FINAL  Final  Anaerobic culture     Status: None   Collection Time: 07/14/15  4:58 PM  Result Value Ref Range Status   Specimen Description OTHER  Final   Special Requests RIGHT KNEE JOINT  Final   Gram Stain   Final    MODERATE WBC PRESENT,BOTH PMN AND MONONUCLEAR NO SQUAMOUS EPITHELIAL CELLS SEEN FEW GRAM POSITIVE COCCI IN PAIRS Performed at [facility]    Culture   Final    NO ANAEROBES ISOLATED Performed at [facility]    Report Status 07/19/2015 FINAL  Final  Anaerobic culture     Status: None   Collection Time: 07/16/15  2:35 PM  Result Value  Ref Range Status   Specimen Description FLUID SYNOVIAL RIGHT KNEE  Final   Special Requests SWAB PT ON VANC  Final   Gram Stain   Final    MODERATE WBC PRESENT,BOTH PMN AND MONONUCLEAR NO ORGANISMS SEEN    Culture NO ANAEROBES ISOLATED  Final   Report Status 07/20/2015 FINAL  Final  Body fluid culture     Status: None   Collection Time: 07/16/15  2:35 PM  Result Value Ref Range Status   Specimen Description FLUID SYNOVIAL RIGHT KNEE  Final   Special Requests SWAB PT ON VANC  Final   Gram Stain   Final    MODERATE WBC PRESENT,BOTH PMN AND MONONUCLEAR NO ORGANISMS SEEN    Culture   Final    RARE STREPTOCOCCUS GROUP C CRITICAL RESULT CALLED TO, READ BACK BY AND VERIFIED WITH: Redge Gainer AT 1208 ON [id number] BY Rhea Bleacher    Report Status 07/19/2015 FINAL  Final   Organism ID, Bacteria STREPTOCOCCUS GROUP C  Final      Susceptibility   Streptococcus group c - MIC*    CLINDAMYCIN <=0.25 RESISTANT Resistant     AMPICILLIN <=0.25 SENSITIVE Sensitive     ERYTHROMYCIN 2 RESISTANT Resistant     VANCOMYCIN 0.5 SENSITIVE Sensitive     CEFTRIAXONE <=0.12 SENSITIVE Sensitive     LEVOFLOXACIN 0.5 SENSITIVE Sensitive     * RARE STREPTOCOCCUS GROUP C  Culture, blood (Routine X 2) w Reflex to ID Panel     Status: None (Preliminary result)   Collection Time:  07/18/15  5:22 AM  Result Value Ref Range Status   Specimen Description BLOOD RIGHT ANTECUBITAL  Final   Special Requests IN PEDIATRIC BOTTLE 2CC  Final   Culture NO GROWTH 2 DAYS  Final   Report Status PENDING  Incomplete  Culture, blood (Routine X 2) w Reflex to ID Panel     Status: None (Preliminary result)   Collection Time: 07/18/15  5:28 AM  Result Value Ref Range Status   Specimen Description BLOOD RIGHT HAND  Final   Special Requests IN PEDIATRIC BOTTLE 3CC  Final   Culture NO GROWTH 2 DAYS  Final   Report Status PENDING  Incomplete  C difficile quick scan w PCR reflex     Status: None   Collection Time: 07/20/15 11:52 AM  Result Value Ref Range Status   C Diff antigen NEGATIVE NEGATIVE Final   C Diff toxin NEGATIVE NEGATIVE Final   C Diff interpretation Negative for toxigenic C. difficile  Final     Labs: Basic Metabolic Panel:  Recent Labs Lab 07/15/15 0330 07/16/15 0617 07/17/15 0629 07/17/15 1902 07/18/15 0513 07/19/15 0450 07/20/15 0520  NA 140 140 138 139 140  139 140 140  K 5.5* 4.7 6.0* 5.3* 5.3*  5.3* 4.6 4.5  CL 114* 113* 113* 111 114*  113* 113* 108  CO2 22 21* 18* 21* 21*  20* 20* 22  GLUCOSE 135* 93 134* 133* 120*  120* 76 92  BUN 31* 30* 26* 30* 31*  31* 26* 23*  CREATININE 2.53* 2.06* 1.67* 1.95* 1.82*  1.76* 1.59* 1.60*  CALCIUM 7.4* 8.0* 8.9 9.0 9.0  8.9 9.3 9.7  MG 1.3* 2.0 1.8  --  1.7 1.4*  --   PHOS  --  3.2 2.7  --  3.0 3.9  --    Liver Function Tests:  Recent Labs Lab 07/14/15 1832 07/15/15 0330 07/16/15 0617 07/17/15 7672 07/18/15 0947 07/19/15 0450 07/20/15 0962  AST 25 21  --   --   --   --  29  ALT 22 21  --   --   --   --  31  ALKPHOS 68 61  --   --   --   --  269*  BILITOT 1.0 0.6  --   --   --   --  1.3*  PROT 5.2* 4.8*  --   --   --   --  5.6*  ALBUMIN 2.4* 2.1* 2.1* 2.2* 2.2* 2.3* 2.3*   No results for input(s): LIPASE, AMYLASE in the last 168 hours. No results for input(s): AMMONIA in the last 168  hours. CBC:  Recent Labs Lab 07/16/15 0617 07/17/15 0629 07/18/15 0513 07/19/15 0450 07/20/15 0520  WBC 9.9 11.3* 13.3* 9.6 11.1*  NEUTROABS 7.7 9.9*  --  6.1 6.8  HGB 9.4* 9.7* 9.2* 9.0* 9.1*  HCT 29.3* 30.3* 29.3* 28.4* 28.3*  MCV 91.0 92.4 91.3 92.2 91.9  PLT 136* 130* 170 173 209   Cardiac Enzymes: No results for input(s): CKTOTAL, CKMB, CKMBINDEX, TROPONINI in the last 168 hours. BNP: BNP (last 3 results) No results for input(s): BNP in the last 8760 hours.  ProBNP (last 3 results) No results for input(s): PROBNP in the last 8760 hours.  CBG:  Recent Labs Lab 07/14/15 1248  GLUCAP 109*       Signed:  Nita Sells MD   [facility] 07/21/2015, 10:47 AM

## 2015-07-21 NOTE — Progress Notes (Signed)
Physical Therapy Treatment Patient Details Name: Juan Wiggins MRN: [id number] DOB: 16-Feb-1951 Today's Date: 07/21/2015    History of Present Illness pt presents with hx of recent spider bite and Septic R TKR, Septic Shock, PNA, and AKI.  pt with recent admit to hospital in [place] for R wrist Abscess.  pt with hx of Polysubstance Abuse, Hep C, Hep A, Depression, Anxiety, HTN, Tachycardia, Pancreatitis, Bil TKR, RA, Sleep Apnea, R Rotator Cuff Repair, Lumbar Lami, and Melanoma.  Pt s/p R I &D R knee with poly exchange and placement of antibiotic beads (5/23)    PT Comments    Pt progressing well with mobility. Patient safe to D/C from a mobility standpoint based on progression towards goals set on PT eval.   Follow Up Recommendations  Home health PT;Supervision - Intermittent     Equipment Recommendations  None recommended by PT    Precautions / Restrictions Precautions Precaution Comments: Order for KI, however pt performing SLR and declining to use it. Required Braces or Orthoses: Knee Immobilizer - Right Knee Immobilizer - Right: Other (comment) (did not use) Restrictions RLE Weight Bearing: Weight bearing as tolerated    Mobility  Bed Mobility               General bed mobility comments: not observed- pt in recliner before and after session  Transfers Overall transfer level: Needs assistance Equipment used: Rolling walker (2 wheeled) Transfers: Sit to/from Stand Sit to Stand: Supervision         General transfer comment: demo'd safe technique, no cues or assistance needed  Ambulation/Gait Ambulation/Gait assistance: Supervision Ambulation Distance (Feet): 400 Feet Assistive device: Rolling walker (2 wheeled) Gait Pattern/deviations: Step-through pattern;Antalgic   Gait velocity interpretation: at or above normal speed for age/gender General Gait Details: occasional cues for walker positioning with gait. cues to allow natural knee flexion for toe off on right  with gait vs "stiff leg" gait pattern now that he is not wearing KI with gait        Cognition Arousal/Alertness: Awake/alert Behavior During Therapy: WFL for tasks assessed/performed Overall Cognitive Status: Within Functional Limits for tasks assessed            Exercises Total Joint Exercises Ankle Circles/Pumps: AROM;Both;5 reps;Seated Quad Sets: AROM;Strengthening;10 reps;Right;Seated Straight Leg Raises: AROM;Strengthening;Right;5 reps;Seated Long Arc Quad: AROM;Strengthening;Right;5 reps;Seated     Pertinent Vitals/Pain Pain Assessment: 0-10 Pain Score: 6  Pain Location: right knee Pain Descriptors / Indicators: Aching;Sore Pain Intervention(s): Limited activity within patient's tolerance;Monitored during session;Premedicated before session;Repositioned     PT Goals (current goals can now be found in the care plan section) Acute Rehab PT Goals Patient Stated Goal: Walk without pain. PT Goal Formulation: With patient Time For Goal Achievement: 07/29/15 Potential to Achieve Goals: Good Progress towards PT goals: Progressing toward goals    Frequency  Min 5X/week    PT Plan Current plan remains appropriate    End of Session Equipment Utilized During Treatment: Gait belt   Patient left: in chair;with call bell/phone within reach     Time: 1221-1235 PT Time Calculation (min) (ACUTE ONLY): 14 min  Charges:  $Gait Training: 8-22 mins           Willow Ora 07/21/2015, 12:39 PM  Willow Ora, PTA, [id number] 07/21/2015, 12:40 PM

## 2015-07-21 NOTE — Progress Notes (Signed)
Pt discharge education and instructions completed with pt and family at bedside; both voices understanding and denies any questions. Pt  SL PICC capped by IV team per protocol. Pt incision dsg remains clean, dry and intact; pt handed her prescriptions for oxycodone and rocephin. Pt discharge home with family to transport him home. Pt pre-medicated prior to discharge. Pt transported off unit via wheelchair with belongings to the side. Delia Heady RN

## 2015-07-21 NOTE — Progress Notes (Signed)
Subjective: 5 Days Post-Op Procedure(s) (LRB): IRRIGATION AND DEBRIDEMENT RIGHT KNEE WITH POLY EXCHANGE AND PLACEMENT OF ANTIBIOTIC BEADS (Right) Patient reports pain as mild.   Reports diarrhea yesterday - ampicillin D/C'd by ID and abx held overnight to start ceftriaxone this AM. No other c/o. Diarrhea resolved. Seen by myself and Dr. Tonita Cong in AM rounds.  Objective: Vital signs in last 24 hours: Temp:  [98.1 F (36.7 C)-98.8 F (37.1 C)] 98.2 F (36.8 C) (05/28 0416) Pulse Rate:  [61-68] 63 (05/28 0416) Resp:  [16-18] 16 (05/28 0416) BP: (144-150)/(71-77) 150/77 mmHg (05/28 0416) SpO2:  [94 %-99 %] 99 % (05/28 0416) Weight:  [94.575 kg (208 lb 8 oz)] 94.575 kg (208 lb 8 oz) (05/28 0416)  Intake/Output from previous day: 05/27 0701 - 05/28 0700 In: 480 [P.O.:280; IV Piggyback:200] Out: -  Intake/Output this shift:     Recent Labs  07/19/15 0450 07/20/15 0520  HGB 9.0* 9.1*    Recent Labs  07/19/15 0450 07/19/15 1800 07/20/15 0520  WBC 9.6  --  11.1*  RBC 3.08* 3.23* 3.08*  HCT 28.4*  --  28.3*  PLT 173  --  209    Recent Labs  07/19/15 0450 07/20/15 0520  NA 140 140  K 4.6 4.5  CL 113* 108  CO2 20* 22  BUN 26* 23*  CREATININE 1.59* 1.60*  GLUCOSE 76 92  CALCIUM 9.3 9.7   No results for input(s): LABPT, INR in the last 72 hours.  Neurologically intact ABD soft Neurovascular intact Sensation intact distally Intact pulses distally Dorsiflexion/Plantar flexion intact Incision: dressing C/D/I and no drainage No cellulitis present Compartment soft no sign of DVT  Assessment/Plan: 5 Days Post-Op Procedure(s) (LRB): IRRIGATION AND DEBRIDEMENT RIGHT KNEE WITH POLY EXCHANGE AND PLACEMENT OF ANTIBIOTIC BEADS (Right) Advance diet Up with therapy  Continue abx per ID- ceftriaxone this AM. Negative for C. Diff Appreciate ID recommendations on abx for D/C when ready Continue daily dressing changes  BISSELL, JACLYN M. 07/21/2015, 8:40 AM

## 2015-07-23 LAB — CULTURE, BLOOD (ROUTINE X 2)
CULTURE: NO GROWTH
Culture: NO GROWTH

## 2015-07-24 ENCOUNTER — Telehealth: Payer: Self-pay | Admitting: *Deleted

## 2015-07-24 MED ORDER — RIFAMPIN 300 MG PO CAPS: 300.0000 mg | ORAL_CAPSULE | Freq: Two times a day (BID) | ORAL | Status: DC

## 2015-07-24 MED FILL — rifAMPin 300 MG CAPS: 300 | 20 days supply | Qty: 39 | Fill #0

## 2015-07-24 NOTE — Telephone Encounter (Signed)
Pt does not have Rifampin rx in the home per [facility].  Phone call to pt's pharmacy, [place], [place].  Rifampin rx [facility].  Per the pharmacy tech the patient choose not to pick up the Rifampin when he picked up his other prescriptions.  Arville Go  RN notified.  Arville Go RN stated that she would contact the [place] Education officer, museum to see about assisting the patient to pay for the Rifampin.  [facility] RN will check with the [facility] pharmacy tech to find out cost through the [place].  Rifampin prescription sent to [facility] so that cost could be checked.  [facility] RN will call the Arville Go RN back when information is available.

## 2015-08-06 NOTE — Telephone Encounter (Signed)
Per [place] pt picked up Rifampin on 07/24/15.

## 2015-09-03 ENCOUNTER — Encounter: Payer: Self-pay | Admitting: Internal Medicine

## 2015-09-03 ENCOUNTER — Ambulatory Visit (INDEPENDENT_AMBULATORY_CARE_PROVIDER_SITE_OTHER): Payer: 59 | Admitting: Internal Medicine

## 2015-09-03 VITALS — BP 116/71 | HR 62 | Temp 97.6°F | Wt 191.0 lb

## 2015-09-03 DIAGNOSIS — N183 Chronic kidney disease, stage 3 unspecified: Secondary | ICD-10-CM

## 2015-09-03 DIAGNOSIS — T8450XS Infection and inflammatory reaction due to unspecified internal joint prosthesis, sequela: Secondary | ICD-10-CM | POA: Diagnosis not present

## 2015-09-03 DIAGNOSIS — B182 Chronic viral hepatitis C: Secondary | ICD-10-CM

## 2015-09-03 MED ORDER — AMOXICILLIN 500 MG PO CAPS: 500.0000 mg | ORAL_CAPSULE | Freq: Three times a day (TID) | ORAL | Status: DC

## 2015-09-03 MED FILL — AMOXICILLIN 500 MG CAPSULE: 500 | 30 days supply | Qty: 90 | Fill #0

## 2015-09-03 NOTE — Progress Notes (Signed)
  RFV: hospital follow up for streptococcal bacteremia and prosthetic joint infection Subjective:    Patient ID: Juan Wiggins, male    DOB: April 02, 1950, 65 y.o.   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 He underwent right knee I x D, with abtx beads placement. He was initially on amp plus rifampin but then developed abtx associated diarreha. he was discharged on 6 wk of ceftriaxone plus rifampin which ended on 7/10, though it sounds like he only had 3-4 wks of rifampin. He is here for followed up  Previous to this infection he had life threatening group a strep infection/ nec fasc to his right forearm with severe sepsis requiring intubation and CRRT/IHD at [facility] x 3 wk in Spring 2017  In regards to hep C, he is genotype 2a/2c, VL 8.56M  Review of Systems occasionall dizzy, loss of appetite from abtx. He has lost 50# since initial hospitalization for group a strep    Objective:   Physical Exam BP 116/71 mmHg  Pulse 62  Temp(Src) 97.6 F (36.4 C) (Oral)  Wt 191 lb (86.637 kg)  Lab Results  Component Value Date   ESRSEDRATE 20* 07/14/2015   Lab Results  Component Value Date   CRP 6.5* 07/14/2015   BMET    Component Value Date/Time   NA 140 07/20/2015 0520   K 4.5 07/20/2015 0520   CL 108 07/20/2015 0520   CO2 22 07/20/2015 0520   GLUCOSE 92 07/20/2015 0520   BUN 23* 07/20/2015 0520   CREATININE 1.60* 07/20/2015 0520   CALCIUM 9.7 07/20/2015 0520   GFRNONAA 44* 07/20/2015 0520   GFRAA 51* 07/20/2015 0520         Assessment & Plan:  Prosthetic joint infection = will continue him with addn 4.5 months of amoxicillin since he has retained HW.  ckd 3 = will recheck cmp  Chronic hepatitis c = will get fibrosure today, also get u/s, cmp, protime/INR, will check if he has hep a or hep b  immunization as well

## 2015-09-04 LAB — COMPLETE METABOLIC PANEL WITH GFR
ALBUMIN: 3.1 g/dL — AB (ref 3.6–5.1)
ALK PHOS: 56 U/L (ref 40–115)
ALT: 13 U/L (ref 9–46)
AST: 18 U/L (ref 10–35)
BILIRUBIN TOTAL: 0.3 mg/dL (ref 0.2–1.2)
BUN: 10 mg/dL (ref 7–25)
CALCIUM: 8.6 mg/dL (ref 8.6–10.3)
CO2: 24 mmol/L (ref 20–31)
Chloride: 104 mmol/L (ref 98–110)
Creat: 1.25 mg/dL (ref 0.70–1.25)
GFR, EST AFRICAN AMERICAN: 70 mL/min (ref >=60)
GFR, EST NON AFRICAN AMERICAN: 61 mL/min (ref >=60)
Glucose, Bld: 112 mg/dL — ABNORMAL HIGH (ref 65–99)
Potassium: 4.7 mmol/L (ref 3.5–5.3)
Sodium: 139 mmol/L (ref 135–146)
TOTAL PROTEIN: 6.4 g/dL (ref 6.1–8.1)

## 2015-09-04 LAB — CBC WITH DIFFERENTIAL/PLATELET
BASOS PCT: 1 %
Basophils Absolute: 103 cells/uL (ref 0–200)
EOS PCT: 17 %
Eosinophils Absolute: 1751 cells/uL — ABNORMAL HIGH (ref 15–500)
HCT: 35.9 % — ABNORMAL LOW (ref 38.5–50.0)
Hemoglobin: 11.5 g/dL — ABNORMAL LOW (ref 13.2–17.1)
LYMPHS PCT: 30 %
Lymphs Abs: 3090 cells/uL (ref 850–3900)
MCH: 28.8 pg (ref 27.0–33.0)
MCHC: 32 g/dL (ref 32.0–36.0)
MCV: 89.8 fL (ref 80.0–100.0)
MONOS PCT: 12 %
MPV: 10.3 fL (ref 7.5–12.5)
Monocytes Absolute: 1236 cells/uL — ABNORMAL HIGH (ref 200–950)
NEUTROS ABS: 4120 {cells}/uL (ref 1500–7800)
NEUTROS PCT: 40 %
PLATELETS: 348 10*3/uL (ref 140–400)
RBC: 4 MIL/uL — AB (ref 4.20–5.80)
RDW: 15.2 % — AB (ref 11.0–15.0)
WBC: 10.3 10*3/uL (ref 3.8–10.8)

## 2015-09-04 LAB — C-REACTIVE PROTEIN: CRP: 10 mg/dL — AB (ref <0.60)

## 2015-09-04 LAB — PROTIME-INR
INR: 1
PROTHROMBIN TIME: 11.1 s (ref 9.0–11.5)

## 2015-09-04 LAB — SEDIMENTATION RATE: SED RATE: 12 mm/h (ref 0–20)

## 2015-09-06 LAB — LIVER FIBROSIS, FIBROTEST-ACTITEST
ALPHA-2-MACROGLOBULIN: 197 mg/dL (ref 106–279)
ALT: 13 U/L (ref 9–46)
Apolipoprotein A1: 69 mg/dL — ABNORMAL LOW (ref 94–176)
Bilirubin: 0.2 mg/dL (ref 0.2–1.2)
Fibrosis Score: 0.27
GGT: 41 U/L (ref 3–70)
Haptoglobin: 286 mg/dL — ABNORMAL HIGH (ref 43–212)
NECROINFLAMMAT ACT SCORE: 0.04
Reference ID: 1575810

## 2015-09-06 LAB — HEPATITIS B SURFACE ANTIGEN: Hepatitis B Surface Ag: POSITIVE — AB

## 2015-09-06 LAB — HEPATITIS B SURFACE ANTIBODY,QUALITATIVE: Hep B S Ab: NEGATIVE

## 2015-09-06 LAB — HEPATITIS A ANTIBODY, TOTAL: HEP A TOTAL AB: REACTIVE — AB

## 2015-09-06 LAB — HEPATITIS B SURF AG CONFIRMATION: HEPATITIS B SURFACE ANTIGEN CONFIRMATION: POSITIVE — AB

## 2015-09-06 LAB — HEPATITIS B CORE ANTIBODY, TOTAL: Hep B Core Total Ab: NONREACTIVE

## 2015-10-10 ENCOUNTER — Ambulatory Visit: Payer: Self-pay | Admitting: Internal Medicine

## 2015-10-17 MED FILL — AMOXICILLIN 500 MG CAPSULE: 500 | 30 days supply | Qty: 90 | Fill #1

## 2015-10-22 ENCOUNTER — Encounter: Payer: Self-pay | Admitting: Internal Medicine

## 2015-10-22 ENCOUNTER — Ambulatory Visit (INDEPENDENT_AMBULATORY_CARE_PROVIDER_SITE_OTHER): Payer: 59 | Admitting: Internal Medicine

## 2015-10-22 VITALS — BP 112/69 | HR 63 | Temp 97.4°F | Wt 197.0 lb

## 2015-10-22 DIAGNOSIS — T8450XS Infection and inflammatory reaction due to unspecified internal joint prosthesis, sequela: Secondary | ICD-10-CM | POA: Diagnosis not present

## 2015-10-22 DIAGNOSIS — B181 Chronic viral hepatitis B without delta-agent: Secondary | ICD-10-CM

## 2015-10-22 DIAGNOSIS — R894 Abnormal immunological findings in specimens from other organs, systems and tissues: Secondary | ICD-10-CM

## 2015-10-22 DIAGNOSIS — Z23 Encounter for immunization: Secondary | ICD-10-CM | POA: Diagnosis not present

## 2015-10-22 DIAGNOSIS — R768 Other specified abnormal immunological findings in serum: Secondary | ICD-10-CM

## 2015-10-22 LAB — CBC WITH DIFFERENTIAL/PLATELET
Basophils Absolute: 216 cells/uL — ABNORMAL HIGH (ref 0–200)
Basophils Relative: 2 %
Eosinophils Absolute: 540 cells/uL — ABNORMAL HIGH (ref 15–500)
Eosinophils Relative: 5 %
HCT: 44.6 % (ref 38.5–50.0)
Hemoglobin: 14.4 g/dL (ref 13.2–17.1)
Lymphocytes Relative: 29 %
Lymphs Abs: 3132 cells/uL (ref 850–3900)
MCH: 29.3 pg (ref 27.0–33.0)
MCHC: 32.3 g/dL (ref 32.0–36.0)
MCV: 90.8 fL (ref 80.0–100.0)
MPV: 12.4 fL (ref 7.5–12.5)
Monocytes Absolute: 1080 cells/uL — ABNORMAL HIGH (ref 200–950)
Monocytes Relative: 10 %
Neutro Abs: 5832 cells/uL (ref 1500–7800)
Neutrophils Relative %: 54 %
Platelets: 336 10*3/uL (ref 140–400)
RBC: 4.91 MIL/uL (ref 4.20–5.80)
RDW: 16.3 % — ABNORMAL HIGH (ref 11.0–15.0)
WBC: 10.8 10*3/uL (ref 3.8–10.8)

## 2015-10-22 LAB — BASIC METABOLIC PANEL
BUN: 17 mg/dL (ref 7–25)
CALCIUM: 9.4 mg/dL (ref 8.6–10.3)
CO2: 26 mmol/L (ref 20–31)
Chloride: 110 mmol/L (ref 98–110)
Creat: 1.35 mg/dL — ABNORMAL HIGH (ref 0.70–1.25)
Glucose, Bld: 105 mg/dL — ABNORMAL HIGH (ref 65–99)
POTASSIUM: 5.4 mmol/L — AB (ref 3.5–5.3)
SODIUM: 139 mmol/L (ref 135–146)

## 2015-10-22 LAB — C-REACTIVE PROTEIN: CRP: 1.1 mg/dL — ABNORMAL HIGH (ref <0.60)

## 2015-10-22 MED ORDER — SOFOSBUVIR-VELPATASVIR 400-100 MG PO TABS: 1.0000 | ORAL_TABLET | Freq: Every day | ORAL | 2 refills | Status: DC

## 2015-10-22 NOTE — Progress Notes (Signed)
Patient ID: Juan Wiggins, male   DOB: Apr 23, 1950, 65 y.o.   MRN: [id number]  HPI Juan Wiggins is a 65yo M with history of chronic hepatitis C, streptococcal prosthetic joint infection, who has finished iv therapy for his prosthetic joint infection, now on amoxicillin for completion of 6 months of treatment. He is doing well with his health  Outpatient Encounter Prescriptions as of 10/22/2015  Medication Sig  . acetaminophen (TYLENOL) 325 MG tablet Take 650 mg by mouth every 6 (six) hours as needed for mild pain or moderate pain.  Marland Kitchen albuterol (PROVENTIL HFA;VENTOLIN HFA) 108 (90 Base) MCG/ACT inhaler Inhale 2-4 puffs into the lungs daily as needed.  . ALPRAZolam (XANAX) 0.25 MG tablet Take 0.25 mg by mouth at bedtime as needed for anxiety or sleep.   Marland Kitchen amoxicillin (AMOXIL) 500 MG capsule Take 1 capsule (500 mg total) by mouth 3 (three) times daily.  Marland Kitchen aspirin 325 MG tablet Take 1 tablet (325 mg total) by mouth 2 (two) times daily.  . cholecalciferol (VITAMIN D) 1000 UNITS tablet Take 3,000 Units by mouth daily.  Marland Kitchen dicyclomine (BENTYL) 20 MG tablet Take 1 tablet (20 mg total) by mouth 2 (two) times daily.  Marland Kitchen escitalopram (LEXAPRO) 20 MG tablet Take 20 mg by mouth at bedtime.   . ferrous sulfate 325 (65 FE) MG tablet Take 1 tablet (325 mg total) by mouth 2 (two) times daily with a meal.  . ibuprofen (ADVIL,MOTRIN) 200 MG tablet Take 400 mg by mouth every 6 (six) hours as needed for mild pain.  Marland Kitchen levothyroxine (SYNTHROID, LEVOTHROID) 175 MCG tablet Take 175 mcg by mouth at bedtime.   Marland Kitchen loperamide (IMODIUM) 2 MG capsule Take 2 capsules (4 mg total) by mouth every 6 (six) hours.  . methocarbamol (ROBAXIN) 500 MG tablet Take 1 tablet (500 mg total) by mouth every 6 (six) hours as needed for muscle spasms.  Marland Kitchen OLANZapine-FLUoxetine (SYMBYAX) 12-50 MG capsule Take 1 capsule by mouth every evening.  Marland Kitchen OVER THE COUNTER MEDICATION Wetting solution: Instill into both eyes daily as needed (wears contacts)  .  oxyCODONE 10 MG TABS Take 1 tablet (10 mg total) by mouth every 4 (four) hours as needed for severe pain.  . promethazine (PHENERGAN) 12.5 MG tablet Take 12.5-25 mg by mouth every 6 (six) hours as needed for nausea or vomiting.   . propranolol ER (INDERAL LA) 60 MG 24 hr capsule Take 2 capsules by mouth at bedtime.  . sucralfate (CARAFATE) 1 G tablet Take 1 tablet (1 g total) by mouth 4 (four) times daily -  with meals and at bedtime.  . valACYclovir (VALTREX) 1000 MG tablet Take 1,000 mg by mouth daily as needed (for flares).    No facility-administered encounter medications on file as of 10/22/2015.      Patient Active Problem List   Diagnosis Date Noted  . Septic arthritis of knee, right ([place]) 07/18/2015  . Bacteremia due to Streptococcus 07/18/2015  . Cellulitis of right upper extremity 07/18/2015  . Hepatitis C 07/18/2015  . Anemia 07/18/2015  . Opacity of lung on imaging study, bilateral 07/18/2015  . Anxiety state 07/18/2015  . History of total knee arthroplasty 07/16/2015  . Severe sepsis ([place]) 07/14/2015  . Chronic pain syndrome 05/21/2014  . Essential hypertension, benign 08/28/2013  . GERD (gastroesophageal reflux disease) 08/28/2013  . Osteoarthritis of left knee 08/23/2013  . Total knee replacement status 08/23/2013  . Alcohol dependence ([place]) 07/14/2013  . Alcohol dependency ([place]) 07/07/2013  .  Major depression ([place]) 07/07/2013  . Varicose veins of lower extremities with other complications [id number]  . Recurrent falls 04/14/2011  . Hypotension 04/13/2011  . ARF (acute renal failure) ([place]) 04/13/2011  . Hypothyroidism 04/13/2011  . Prolapsed and thrombosed 09/03/2010     Health Maintenance Due  Topic Date Due  . HIV Screening  06/21/1967  . TETANUS/TDAP  06/21/1971  . COLONOSCOPY  06/21/2002  . ZOSTAVAX  06/20/2012  . INFLUENZA VACCINE  09/24/2015     Review of Systems Occasional right knee pain, otherwise 10 point ros is negative Physical Exam   BP  112/69   Pulse 63   Temp 97.4 F (36.3 C) (Oral)   Wt 197 lb (89.4 kg)   BMI 27.48 kg/m   Physical Exam  Constitutional: He is oriented to person, place, and time. He appears well-developed and well-nourished. No distress.  HENT:  Mouth/Throat: Oropharynx is clear and moist. No oropharyngeal exudate.  Cardiovascular: Normal rate, regular rhythm and normal heart sounds. Exam reveals no gallop and no friction rub.  No murmur heard.  Pulmonary/Chest: Effort normal and breath sounds normal. No respiratory distress. He has no wheezes.  Abdominal: Soft. Bowel sounds are normal. He exhibits no distension. There is no tenderness.  Lymphadenopathy:  He has no cervical adenopathy.  Ext: right knee slightly swollen Neurological: He is alert and oriented to person, place, and time.  Skin: Skin is warm and dry. No rash noted. No erythema.  Psychiatric: He has a normal mood and affect. His behavior is normal.    Lab Results  Component Value Date   HEPBSAB NEG 09/03/2015   No results found for: RPR  CBC Lab Results  Component Value Date   WBC 10.3 09/03/2015   RBC 4.00 (L) 09/03/2015   HGB 11.5 (L) 09/03/2015   HCT 35.9 (L) 09/03/2015   PLT 348 09/03/2015   MCV 89.8 09/03/2015   MCH 28.8 09/03/2015   MCHC 32.0 09/03/2015   RDW 15.2 (H) 09/03/2015   LYMPHSABS 3,090 09/03/2015   MONOABS 1,236 (H) 09/03/2015   EOSABS 1,751 (H) 09/03/2015   BASOSABS 103 09/03/2015   BMET Lab Results  Component Value Date   NA 139 09/03/2015   K 4.7 09/03/2015   CL 104 09/03/2015   CO2 24 09/03/2015   GLUCOSE 112 (H) 09/03/2015   BUN 10 09/03/2015   CREATININE 1.25 09/03/2015   CALCIUM 8.6 09/03/2015   GFRNONAA 61 09/03/2015   GFRAA 70 09/03/2015     Assessment and Plan Streptococcal prothestic joint infection - continue on amoxicillin  Chronic hepatitis C without hepatic coma - need to get copy of ultrasound from pcp mark arvind [phone] to help with epclusa  application  Hepatitis b surface antigen + - need hepatitis b viral load - to see whether he has chronic hepatitis B to need treatment  - insurance did not cover fibrosure  Addendum: Elevated hepatitis B viral load. We will start initiation of treatment for hepatitis B. He will also meet with ID pharmacy for treatment and initiation of tenofovir

## 2015-10-23 ENCOUNTER — Other Ambulatory Visit: Payer: Self-pay | Admitting: Pharmacist Clinician (PhC)/ Clinical Pharmacy Specialist

## 2015-10-23 LAB — SEDIMENTATION RATE: SED RATE: 1 mm/h (ref 0–20)

## 2015-10-23 MED ORDER — SOFOSBUVIR-VELPATASVIR 400-100 MG PO TABS: 1.0000 | ORAL_TABLET | Freq: Every day | ORAL | 2 refills | Status: DC

## 2015-10-24 ENCOUNTER — Other Ambulatory Visit: Payer: Self-pay | Admitting: Pharmacist Clinician (PhC)/ Clinical Pharmacy Specialist

## 2015-10-24 LAB — HEPATITIS B DNA, ULTRAQUANTITATIVE, PCR
Hepatitis B DNA (Calc): >8.23 Log IU/mL — ABNORMAL HIGH (ref <1.30)
Hepatitis B DNA: >170000000 IU/mL — ABNORMAL HIGH (ref <20)

## 2015-10-24 MED ORDER — SOFOSBUVIR-VELPATASVIR 400-100 MG PO TABS: 1.0000 | ORAL_TABLET | Freq: Every day | ORAL | 2 refills | Status: DC

## 2015-10-25 ENCOUNTER — Telehealth: Payer: Self-pay | Admitting: Pharmacist Clinician (PhC)/ Clinical Pharmacy Specialist

## 2015-10-25 NOTE — Telephone Encounter (Signed)
D/w with Dr. Baxter Flattery. He just got approved for Epclusa. His hep B VL came back at >123mil. We are going to start his hep B treatment also. He will come back 9/5. He can pick up both meds after the visit.

## 2015-10-29 ENCOUNTER — Ambulatory Visit (INDEPENDENT_AMBULATORY_CARE_PROVIDER_SITE_OTHER): Payer: 59 | Admitting: Pharmacist Clinician (PhC)/ Clinical Pharmacy Specialist

## 2015-10-29 DIAGNOSIS — B182 Chronic viral hepatitis C: Secondary | ICD-10-CM

## 2015-10-29 DIAGNOSIS — B181 Chronic viral hepatitis B without delta-agent: Secondary | ICD-10-CM

## 2015-10-29 MED ORDER — TENOFOVIR ALAFENAMIDE FUMARATE 25 MG PO TABS: 1.0000 | ORAL_TABLET | Freq: Every day | ORAL | 6 refills | Status: DC

## 2015-10-29 NOTE — Progress Notes (Signed)
HPI: Juan Wiggins is a 65 y.o. male who presents for Hepatitis B and Hepatitis C treatment initiation.   Lab Results  Component Value Date   HCVGENOTYPE 2a/2c 07/19/2015    Allergies: Allergies  Allergen Reactions  . Heparin Other (See Comments)    Low platelets (130s), no HIT testing performed, platelets recovered.    Vitals:    Past Medical History: Past Medical History:  Diagnosis Date  . Anxiety   . Arthritis    RHEUMATOID ARTHRITIS; OA LEFT KNEE  . Cancer (HCC)    MELANOMA REMOVED RT SHOULDER  . Depression   . GERD (gastroesophageal reflux disease)    PREVACID IF NEEDED  . Heart murmur   . Hemorrhoids   . Hepatitis A    PT STATES TYPE OF HEPATITIS YOU GET FROM SHELLFISH  . Hypertension   . Hypothyroidism   . 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 Pancreatitis, alcoholic, acute   . Sleep apnea    CLAUSTROPHOBIC - COULD NOT TOLERATE CPAP MASK  . Tachycardia    PT STATES HIS HEART RATE USUALLY 100 OR MORE  . Thyroid disease     Social History: Social History   Social History  . Marital status: Legally Separated    Spouse name: N/A  . Number of children: N/A  . Years of education: N/A   Social History Main Topics  . Smoking status: Former Smoker    Packs/day: 1.50    Years: 35.00    Types: Cigars    Quit date: 02/22/1998  . Smokeless tobacco: Never Used     Comment: used cigarettes 20 years ago  . Alcohol use No     Comment: " last alcohol use was in December 2015."  . Drug use: No  . Sexual activity: Yes    Birth control/ protection: None   Other Topics Concern  . Not on file   Social History Narrative  . No narrative on file    Labs: Hep B S Ab (no units)  Date Value  09/03/2015 NEG   Hepatitis B Surface Ag (no units)  Date Value  09/03/2015 POSITIVE (A)    Lab Results  Component Value Date   HCVGENOTYPE 2a/2c 07/19/2015    Hepatitis C RNA quantitative Latest Ref Rng & Units 07/19/2015   HCV Quantitative >50 IU/mL 8,190,000  HCV Quantitative Log >1.70 log10 IU/mL 6.913    AST (U/L)  Date Value  09/03/2015 18  07/20/2015 29  07/15/2015 21   ALT (U/L)  Date Value  09/03/2015 13  09/03/2015 13  07/20/2015 31  07/15/2015 21   INR (no units)  Date Value  09/03/2015 1.0  01/29/2014 1.08  08/15/2013 1.03    CrCl: Estimated Creatinine Clearance: 59.7 mL/min (by C-G formula based on SCr of 1.35 mg/dL).  Fibrosis Score: F0-F1 as assessed by Fibrosure  Child-Pugh Score: A  Previous Treatment Regimen: None  Assessment: Juan Wiggins is a 65 yo male who presents to pharmacy clinic to start hepatitis B and hepatitis C treatment. After discussion about potential risk factors for transmission the patient confirms that he has a history of cocaine use, with last use ~40 years ago.  We also discussed sexual transmission as another risk factor for acquiring the virus. He denies current alcohol or drug use, last drink was 2 years ago.   Juan Wiggins was instructed to begin treatment with Epclusa daily for 3 months and Vemlidy 1  tablet daily. He states he occasionally uses tums for acid reflux and was instructed to separate these at least 6 hours from his Epclusa. We reviewed purpose and potential side effects of these new medications. He was instructed to return 2 weeks after starting treatment.  Recommendations: - Start Epclusa, 1 tablet daily x 84 days, RX sent to [facility], 1 tablet daily, RX sent to [facility] - Will follow up if Shirlee Latch is covered by [employer], if not will have to use Viread 1 tablet daily - Patient to return to pharmacy clinic 2 weeks after starting Epclusa. Instructed to return 2 weeks after starting Macario Carls, PharmD PGY-2 Infectious Diseases Pharmacy Resident [facility] 10/29/2015, 11:52 AM

## 2015-10-29 NOTE — Patient Instructions (Addendum)
Start Epclusa 1 tablet daily x 84 days for hepatitis C Start Vemlidy 1 tablet daily for hepatitis B Please schedule ultrasound with elastography

## 2015-10-30 ENCOUNTER — Encounter: Payer: Self-pay | Admitting: Pharmacy Technician

## 2015-10-30 ENCOUNTER — Other Ambulatory Visit: Payer: Self-pay | Admitting: Pharmacist Clinician (PhC)/ Clinical Pharmacy Specialist

## 2015-10-30 ENCOUNTER — Encounter (HOSPITAL_COMMUNITY): Payer: Self-pay

## 2015-10-30 ENCOUNTER — Telehealth: Payer: Self-pay | Admitting: Pharmacist Clinician (PhC)/ Clinical Pharmacy Specialist

## 2015-10-30 MED ORDER — TENOFOVIR DISOPROXIL FUMARATE 300 MG PO TABS: 300.0000 mg | ORAL_TABLET | Freq: Every day | ORAL | 6 refills | Status: DC

## 2015-10-30 NOTE — Telephone Encounter (Addendum)
Called Juan Wiggins to tell him that his insurance [facility]) denied his Vemlidy for his hep B just like we suspected. Sent in the script for Viread today. He is aware of the plan. He also has a hx of rheumatoid arthritis. His rheumatologist is planning on using Huumira after his hep C is treated. This is more reason to treat his hep B if he is placed on Humira due to flares.

## 2015-11-06 ENCOUNTER — Encounter: Payer: Self-pay | Admitting: Pharmacist Clinician (PhC)/ Clinical Pharmacy Specialist

## 2015-11-14 ENCOUNTER — Ambulatory Visit (INDEPENDENT_AMBULATORY_CARE_PROVIDER_SITE_OTHER): Payer: 59 | Admitting: Pharmacist Clinician (PhC)/ Clinical Pharmacy Specialist

## 2015-11-14 DIAGNOSIS — B181 Chronic viral hepatitis B without delta-agent: Secondary | ICD-10-CM

## 2015-11-14 DIAGNOSIS — B182 Chronic viral hepatitis C: Secondary | ICD-10-CM | POA: Diagnosis not present

## 2015-11-14 NOTE — Progress Notes (Signed)
Patient ID: Juan Wiggins, male   DOB: Jan 07, 1951, 65 y.o.   MRN: [id number] HPI: Juan Wiggins is a 65 y.o. male who is here for follow up after starting his Hep C  And B meds.   Lab Results  Component Value Date   HCVGENOTYPE 2a/2c 07/19/2015    Allergies: Allergies  Allergen Reactions  . Heparin Other (See Comments)    Low platelets (130s), no HIT testing performed, platelets recovered.    Vitals:    Past Medical History: Past Medical History:  Diagnosis Date  . Anxiety   . Arthritis    RHEUMATOID ARTHRITIS; OA LEFT KNEE  . Cancer (HCC)    MELANOMA REMOVED RT SHOULDER  . Depression   . GERD (gastroesophageal reflux disease)    PREVACID IF NEEDED  . Heart murmur   . Hemorrhoids   . Hepatitis A    PT STATES TYPE OF HEPATITIS YOU GET FROM SHELLFISH  . Hypertension   . Hypothyroidism   . 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 Pancreatitis, alcoholic, acute   . Sleep apnea    CLAUSTROPHOBIC - COULD NOT TOLERATE CPAP MASK  . Tachycardia    PT STATES HIS HEART RATE USUALLY 100 OR MORE  . Thyroid disease     Social History: Social History   Social History  . Marital status: Legally Separated    Spouse name: N/A  . Number of children: N/A  . Years of education: N/A   Social History Main Topics  . Smoking status: Former Smoker    Packs/day: 1.50    Years: 35.00    Types: Cigars    Quit date: 02/22/1998  . Smokeless tobacco: Never Used     Comment: used cigarettes 20 years ago  . Alcohol use No     Comment: " last alcohol use was in December 2015."  . Drug use: No  . Sexual activity: Yes    Birth control/ protection: None   Other Topics Concern  . Not on file   Social History Narrative  . No narrative on file    Labs: Hep B S Ab (no units)  Date Value  09/03/2015 NEG   Hepatitis B Surface Ag (no units)  Date Value  09/03/2015 POSITIVE (A)    Lab Results  Component Value Date   HCVGENOTYPE 2a/2c  07/19/2015    Hepatitis C RNA quantitative Latest Ref Rng & Units 07/19/2015  HCV Quantitative >50 IU/mL 8,190,000  HCV Quantitative Log >1.70 log10 IU/mL 6.913    AST (U/L)  Date Value  09/03/2015 18  07/20/2015 29  07/15/2015 21   ALT (U/L)  Date Value  09/03/2015 13  09/03/2015 13  07/20/2015 31  07/15/2015 21   INR (no units)  Date Value  09/03/2015 1.0  01/29/2014 1.08  08/15/2013 1.03    CrCl: CrCl cannot be calculated (Unknown ideal weight.).  Fibrosis Score: F0//1 as assessed by fibrosure  Child-Pugh Score:  Class A  Previous Treatment Regimen: None  Assessment: Juan Wiggins started his Epclusa on 9/7 and his Viread on 9/9. His insurance would not approve Vemlidy but approved the Viread without any issues. So far he has not missed any doses of either meds. He stated that he has some tiredness after starting the medication. Explained to him that it can happen with his epclusa. Stress not to miss doses of either medication. He has an order for an elastography but it was  never scheduled. He will call radiology to see if it can be scheduled. We are using Viread in combination with his hep C treatment to prevent hepatic flares. Scheduled him to come back for hep C VL in 2 wks. He will see Dr. Baxter Wiggins in early Dec so it'll be perfect timing EOT hep c VL and we can get the hep B VL at that time.   Recommendations:  Cont Epclusa x 3 mo Cont Viread for hepatitis B F/u in 2 wks for hep C VL F/u with Dr. Baxter Wiggins in Dec  Juan Wiggins, [place], [place].D., BCPS, AAHIVP [facility] 11/14/2015, 11:48 AM

## 2015-11-14 NOTE — Patient Instructions (Signed)
Cont Epclusa and Viread Follow up in about 2 wks for lab Follow up with Dr Baxter Flattery in December

## 2015-11-17 NOTE — Progress Notes (Signed)
1 and felt like she will be helpful. He was here. Psychiatric Initial Adult Assessment   Patient Identification: Juan Wiggins MRN:  [id number] Date of Evaluation:  11/18/2015 Referral Source:   Chief Complaint:   Chief Complaint    New Evaluation; Depression; Insomnia     Visit Diagnosis:    ICD-9-CM ICD-10-CM   1. Major depressive disorder, recurrent episode, moderate (HCC) 296.32 F33.1     History of Present Illness:   65 yo M with alcohol use, depression, history of chronic hepatitis C, recently diagnosed Hep B,  streptococcal prosthetic joint infection, who presented for depression.  Patient states that things has been "upside down" since he is in the process of divorce from his wife of 31 years. He feels that he lost everything, as he has devoted himself for the family. 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 He talks about recent admission due to streptococcus infection and complains of severe pain in his knee. He wants "Stabilization and motivation."  He reports insomnia with night time awakening. He finds Zolpidem to be helpful. He endorses anhedonia and difficulty in activity due to his physical health. He denies appetite loss, decreased concentration. He denies SI. He denies AH/VH/HI. He denies any alcohol use. He has been in sobriety for 2.5 years; used to drink half a gallon of scotch per week. He denies drug use. He has been on Symbyax for two years, prescribed by his PCP with limited effect. He used to be on Xanax, but it was discontinued by his PCP a few months ago.   Associated Signs/Symptoms: Depression Symptoms:  depressed mood, anhedonia, insomnia, fatigue, (Hypo) Manic Symptoms:  denies Anxiety Symptoms:  mild anxiety Psychotic Symptoms:  denies PTSD Symptoms: denies  Past Psychiatric History:  Never seen psychiatrist, no previous admission  Previous Psychotropic Medications: Yes   Substance Abuse History  in the last 12 months:  No.  Consequences of Substance Abuse: DUI  Past Medical History:  Past Medical History:  Diagnosis Date  . Anxiety   . Arthritis    RHEUMATOID ARTHRITIS; OA LEFT KNEE  . Cancer (HCC)    MELANOMA REMOVED RT SHOULDER  . Depression   . GERD (gastroesophageal reflux disease)    PREVACID IF NEEDED  . Heart murmur   . Hemorrhoids   . Hepatitis A    PT STATES TYPE OF HEPATITIS YOU GET FROM SHELLFISH  . Hypertension   . Hypothyroidism   . 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 Pancreatitis, alcoholic, acute   . Sleep apnea    CLAUSTROPHOBIC - COULD NOT TOLERATE CPAP MASK  . Tachycardia    PT STATES HIS HEART RATE USUALLY 100 OR MORE  . Thyroid disease     Past Surgical History:  Procedure Laterality Date  . colonscopy     . ELBOW SURGERY     BILATERAL ELBOW SURGERY  . I&D KNEE WITH POLY EXCHANGE Right 07/16/2015   Procedure: IRRIGATION AND DEBRIDEMENT RIGHT KNEE WITH POLY EXCHANGE AND PLACEMENT OF ANTIBIOTIC BEADS;  Surgeon: Latanya Maudlin, MD;  Location: [place];  Service: Orthopedics;  Laterality: Right;  . KNEE ARTHROSCOPY Right 07/14/2015   Procedure: ARTHROSCOPY IRRIGATION AND DEBRIDEMENT - KNEE;  Surgeon: Justice Britain, MD;  Location: [facility];  Service: Orthopedics;  Laterality: Right;  . KNEE SURGERY     BILATERAL KNEE ARTHROSCOPY  . left TKR  July 2015  . LUMBAR LAMINECTOMY/DECOMPRESSION MICRODISCECTOMY Left 06/06/2014   Procedure: LEFT L4-5 DECOMPRESSION ;  Surgeon: Melina Schools, MD;  Location: [place];  Service: Orthopedics;  Laterality: Left;  . REFRACTIVE SURGERY    . SHOULDER SURGERY     RIGHT ROTATOR CUFF REPAIR AND LEFT ARTHROSCOPY  . TOTAL KNEE ARTHROPLASTY Left 08/23/2013   Procedure: LEFT TOTAL KNEE ARTHROPLASTY;  Surgeon: Tobi Bastos, MD;  Location: [facility];  Service: Orthopedics;  Laterality: Left;  . TOTAL KNEE ARTHROPLASTY Right 02/07/2014   Procedure: RIGHT TOTAL KNEE ARTHROPLASTY;  Surgeon:  Tobi Bastos, MD;  Location: [facility];  Service: Orthopedics;  Laterality: Right;    Family Psychiatric History: denies mental health issues, denies suicide attempt  Family History:  Family History  Problem Relation Age of Onset  . Hypertension Mother   . Other Mother     varicose veins  . Hypertension Sister     Social History:   Social History   Social History  . Marital status: Legally Separated    Spouse name: N/A  . Number of children: N/A  . Years of education: N/A   Social History Main Topics  . Smoking status: Former Smoker    Packs/day: 1.50    Years: 35.00    Types: Cigars    Quit date: 02/22/1998  . Smokeless tobacco: Never Used     Comment: used cigarettes 20 years ago  . Alcohol use No     Comment: " last alcohol use was in December 2015."  . Drug use: No  . Sexual activity: Yes    Birth control/ protection: None   Other Topics Concern  . None   Social History Narrative  . 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 Heparin Other (See Comments)    Low platelets (130s), no HIT testing performed, platelets recovered.    Metabolic Disorder Labs: No results found for: HGBA1C, MPG No results found for: PROLACTIN Lab Results  Component Value Date   TRIG 88 05/22/2014     Current Medications: Current Outpatient Prescriptions  Medication Sig Dispense Refill  . acetaminophen (TYLENOL) 325 MG tablet Take 650 mg by mouth every 6 (six) hours as needed for mild pain or moderate pain.    Marland Kitchen albuterol (PROVENTIL HFA;VENTOLIN HFA) 108 (90 Base) MCG/ACT inhaler Inhale 2-4 puffs into the lungs daily as needed.    Marland Kitchen amoxicillin (AMOXIL) 500 MG capsule Take 1 capsule (500 mg total) by mouth 3 (three) times daily. 90 capsule 4  . aspirin 325 MG tablet Take 1 tablet (325 mg total) by mouth 2 (two) times daily. 60 tablet 0  .  cholecalciferol (VITAMIN D) 1000 UNITS tablet Take 3,000 Units by mouth daily.    Marland Kitchen dicyclomine (BENTYL) 20 MG tablet Take 1 tablet (20 mg total) by mouth 2 (two) times daily. 20 tablet 0  . escitalopram (LEXAPRO) 20 MG tablet Take 20 mg by mouth at bedtime.     Marland Kitchen ibuprofen (ADVIL,MOTRIN) 200 MG tablet Take 400 mg by mouth every 6 (six) hours as needed for mild pain.    Marland Kitchen levothyroxine (SYNTHROID, LEVOTHROID) 175 MCG tablet Take 175 mcg by mouth at bedtime.     Marland Kitchen OVER THE COUNTER MEDICATION Wetting solution: Instill into both eyes daily as needed (wears contacts)    . oxyCODONE 10 MG TABS Take  1 tablet (10 mg total) by mouth every 4 (four) hours as needed for severe pain. 30 tablet 0  . promethazine (PHENERGAN) 12.5 MG tablet Take 12.5-25 mg by mouth every 6 (six) hours as needed for nausea or vomiting.     . propranolol ER (INDERAL LA) 60 MG 24 hr capsule Take 2 capsules by mouth at bedtime.    . silodosin (RAPAFLO) 8 MG CAPS capsule Take 8 mg by mouth at bedtime.    . Sofosbuvir-Velpatasvir (EPCLUSA) 400-100 MG TABS Take 1 tablet by mouth daily. 28 tablet 2  . tenofovir (VIREAD) 300 MG tablet Take 1 tablet (300 mg total) by mouth daily. 30 tablet 6  . valACYclovir (VALTREX) 1000 MG tablet Take 1,000 mg by mouth daily as needed (for flares).   2  . buPROPion (WELLBUTRIN XL) 150 MG 24 hr tablet Take 1 tablet (150 mg total) by mouth every morning. 30 tablet 2  . ferrous sulfate 325 (65 FE) MG tablet Take 1 tablet (325 mg total) by mouth 2 (two) times daily with a meal. (Patient not taking: Reported on 11/18/2015) 60 tablet 2  . loperamide (IMODIUM) 2 MG capsule Take 2 capsules (4 mg total) by mouth every 6 (six) hours. (Patient not taking: Reported on 11/18/2015) 30 capsule 0  . methocarbamol (ROBAXIN) 500 MG tablet Take 1 tablet (500 mg total) by mouth every 6 (six) hours as needed for muscle spasms. (Patient not taking: Reported on 11/18/2015) 20 tablet 0  . sucralfate (CARAFATE) 1 G tablet Take 1  tablet (1 g total) by mouth 4 (four) times daily -  with meals and at bedtime. (Patient not taking: Reported on 11/18/2015) 10 tablet 0  . zolpidem (AMBIEN) 10 MG tablet Take 1 tablet (10 mg total) by mouth at bedtime as needed for sleep. 30 tablet 0   No current facility-administered medications for this visit.     Neurologic: Headache: No Seizure: No Paresthesias:No  Musculoskeletal: Strength & Muscle Tone: within normal limits Gait & Station: normal Patient leans: N/A  Psychiatric Specialty Exam: Review of Systems  Musculoskeletal: Positive for joint pain.  Psychiatric/Behavioral: Positive for depression. Negative for hallucinations, substance abuse and suicidal ideas. The patient is nervous/anxious and has insomnia.   All other systems reviewed and are negative.   Blood pressure 122/74, pulse 62, height 5\' 11"  (1.803 m), weight 192 lb 9.6 oz (87.4 kg).Body mass index is 26.86 kg/m.  General Appearance: Fairly Groomed  Eye Contact:  Good  Speech:  Clear and Coherent  Volume:  Normal  Mood:  Depressed  Affect:  Restricted  Thought Process:  Coherent and Goal Directed  Orientation:  Full (Time, Place, and Person)  Thought Content:  Logical  Suicidal Thoughts:  No  Homicidal Thoughts:  No  Memory:  Immediate;   Fair Recent;   Fair Remote;   Fair  Judgement:  Fair  Insight:  Fair  Psychomotor Activity:  Normal  Concentration:  Concentration: Fair and Attention Span: Fair  Recall:  Good  Fund of Knowledge:Negative  Language: Good  Akathisia:  NA  Handed:  Right  AIMS (if indicated):  No tremors,   Assets:  Communication Skills Desire for Improvement  ADL's:  Intact  Cognition: WNL  Sleep:  insomnia   Assessment 65 yo M with alcohol use, depression, alcohol use disorder in sustained remission, history of chronic hepatitis C, recently diagnosed Hep B,  streptococcal prosthetic joint infection, who presented for depression.  # MDD, recurrent, moderate without  psychotic features Patient  endorses neurovegetative symptoms in the setting of marital discordance and concern of unemployment. Will discontinue Symbyax given its limited effect. Will continue Escitalopram and start Bupropion to target his anhedonia/mood symptoms. Although he would be a great candidate for supportive psychotherapy/CBT, he declines this option at this time.   # Insomnia He reports night time awakening with pain. Will continue Ambien at this time. Discussed sleep hygiene. May consider adding mirtazapine in the future if insomnia persists despite improvement in his depression. Will not restart Xanax given its potential side effect of physical dependence; patient agreed with this plan.   # Alcohol use disorder, sustained remission Patient has been in sobriety for more than 2.5 years. Will continue to monitor and do motivational interview as needed.    Treatment Plan Summary: as below  1.Continue Escitalopram 20 mg daily 2. Start Wellbutrin 150 mg daily 3. Continue Ambien 5-10 mg qhsprn for sleep 4. Discontinue Symbyax 5. Return to clinic in a month  The patient demonstrates the following  risk factors for suicide: Chronic risk factors for suicide include psychiatric disorder /depression, history of alcohol use, chronic pain, demographic factors (male, >48 yo), Acute risk factors for suicide include family or marital conflict, concern for unemployment  Protective factors for this patient include coping skills, hope for the future. Patient denies any SI and denies previous suicide attempt. Considering these factors, the overall suicide risk at this point appears to be low.  Norman Clay, MD 9/25/201712:34 PM

## 2015-11-18 ENCOUNTER — Encounter (HOSPITAL_COMMUNITY): Payer: Self-pay | Admitting: Psychiatry

## 2015-11-18 ENCOUNTER — Ambulatory Visit (INDEPENDENT_AMBULATORY_CARE_PROVIDER_SITE_OTHER): Payer: 59 | Admitting: Psychiatry

## 2015-11-18 VITALS — BP 122/74 | HR 62 | Ht 71.0 in | Wt 192.6 lb

## 2015-11-18 DIAGNOSIS — F331 Major depressive disorder, recurrent, moderate: Secondary | ICD-10-CM

## 2015-11-18 MED ORDER — BUPROPION HCL ER (XL) 150 MG PO TB24: 150.0000 mg | ORAL_TABLET | ORAL | 2 refills | Status: DC

## 2015-11-18 MED ORDER — ZOLPIDEM TARTRATE 10 MG PO TABS: 10.0000 mg | ORAL_TABLET | Freq: Every evening | ORAL | 0 refills | Status: DC | PRN

## 2015-11-18 NOTE — Patient Instructions (Signed)
1.Continue lexapro 20 mg daily 2. Start Wellbutrin 150 mg daily 3. Continue Ambien 5-10 mg at night as needed for sleep 4. Discontinue Symbyax 5. Return to clinic in a month

## 2015-11-26 ENCOUNTER — Telehealth (HOSPITAL_COMMUNITY): Payer: Self-pay

## 2015-11-26 NOTE — Telephone Encounter (Signed)
Discussed with patient. Discontinue wellbutrin and will dicuss regarding other treatment option on the next encounter.

## 2015-11-26 NOTE — Telephone Encounter (Signed)
Patient calling, he said he started the Wellbutrin and the Ambien, however he said he experienced vomiting, diarrhea, and hallucinations. He said he took the medications on Thursday and Friday, he stopped both medications on Saturday and did not feel normal until Monday.

## 2015-11-26 NOTE — Telephone Encounter (Signed)
Patient states he had hallucinations, vomiting, diarrhea after starting Wellbutrin. Patient discontinued medication few days ago and he denies any physical symptoms. He continues to take Ambien 5 mg at night. He continues to feel depressed and has insomnia.   Given his recent reaction to medication, will continue only ecitalopram and Ambien as needed. Will discuss the option of adding mirtazapine on the next encounter. Patient agrees with the plan.

## 2015-12-02 ENCOUNTER — Other Ambulatory Visit: Payer: 59

## 2015-12-02 DIAGNOSIS — B182 Chronic viral hepatitis C: Secondary | ICD-10-CM

## 2015-12-02 LAB — COMPLETE METABOLIC PANEL WITH GFR
ALBUMIN: 3.1 g/dL — AB (ref 3.6–5.1)
ALK PHOS: 448 U/L — AB (ref 40–115)
ALT: 130 U/L — ABNORMAL HIGH (ref 9–46)
AST: 213 U/L — ABNORMAL HIGH (ref 10–35)
BILIRUBIN TOTAL: 5.9 mg/dL — AB (ref 0.2–1.2)
BUN: 16 mg/dL (ref 7–25)
CO2: 26 mmol/L (ref 20–31)
Calcium: 9 mg/dL (ref 8.6–10.3)
Chloride: 103 mmol/L (ref 98–110)
Creat: 1.52 mg/dL — ABNORMAL HIGH (ref 0.70–1.25)
GFR, EST AFRICAN AMERICAN: 56 mL/min — AB (ref >=60)
GFR, EST NON AFRICAN AMERICAN: 48 mL/min — AB (ref >=60)
Glucose, Bld: 100 mg/dL — ABNORMAL HIGH (ref 65–99)
POTASSIUM: 4.8 mmol/L (ref 3.5–5.3)
Sodium: 137 mmol/L (ref 135–146)
TOTAL PROTEIN: 6.6 g/dL (ref 6.1–8.1)

## 2015-12-03 LAB — HEPATITIS C RNA QUANTITATIVE: HCV QUANT: NOT DETECTED [IU]/mL (ref <15)

## 2015-12-04 ENCOUNTER — Telehealth: Payer: Self-pay | Admitting: Pharmacist Clinician (PhC)/ Clinical Pharmacy Specialist

## 2015-12-04 ENCOUNTER — Emergency Department (HOSPITAL_COMMUNITY): Payer: 59

## 2015-12-04 ENCOUNTER — Other Ambulatory Visit: Payer: Self-pay | Admitting: Internal Medicine

## 2015-12-04 ENCOUNTER — Telehealth: Payer: Self-pay | Admitting: Internal Medicine

## 2015-12-04 ENCOUNTER — Encounter (HOSPITAL_COMMUNITY): Payer: Self-pay

## 2015-12-04 ENCOUNTER — Emergency Department (HOSPITAL_COMMUNITY)
Admission: EM | Admit: 2015-12-04 | Discharge: 2015-12-04 | Disposition: A | Discharge status: Home / Self Care | Payer: 59 | Attending: Emergency Medicine | Admitting: Emergency Medicine

## 2015-12-04 DIAGNOSIS — Z85820 Personal history of malignant melanoma of skin: Secondary | ICD-10-CM | POA: Insufficient documentation

## 2015-12-04 DIAGNOSIS — I10 Essential (primary) hypertension: Secondary | ICD-10-CM | POA: Insufficient documentation

## 2015-12-04 DIAGNOSIS — Z87891 Personal history of nicotine dependence: Secondary | ICD-10-CM | POA: Insufficient documentation

## 2015-12-04 DIAGNOSIS — R17 Unspecified jaundice: Secondary | ICD-10-CM

## 2015-12-04 DIAGNOSIS — E039 Hypothyroidism, unspecified: Secondary | ICD-10-CM | POA: Insufficient documentation

## 2015-12-04 DIAGNOSIS — Z7982 Long term (current) use of aspirin: Secondary | ICD-10-CM | POA: Diagnosis not present

## 2015-12-04 DIAGNOSIS — R109 Unspecified abdominal pain: Secondary | ICD-10-CM

## 2015-12-04 DIAGNOSIS — Z96653 Presence of artificial knee joint, bilateral: Secondary | ICD-10-CM | POA: Diagnosis not present

## 2015-12-04 DIAGNOSIS — R945 Abnormal results of liver function studies: Secondary | ICD-10-CM | POA: Insufficient documentation

## 2015-12-04 DIAGNOSIS — R7989 Other specified abnormal findings of blood chemistry: Secondary | ICD-10-CM

## 2015-12-04 HISTORY — DX: Unspecified viral hepatitis B without hepatic coma: B19.10

## 2015-12-04 HISTORY — DX: Unspecified viral hepatitis C without hepatic coma: B19.20

## 2015-12-04 LAB — CBC WITH DIFFERENTIAL/PLATELET
BASOS ABS: 0.2 10*3/uL — AB (ref 0.0–0.1)
BASOS PCT: 1 %
Eosinophils Absolute: 0.9 10*3/uL — ABNORMAL HIGH (ref 0.0–0.7)
Eosinophils Relative: 7 %
HEMATOCRIT: 42 % (ref 39.0–52.0)
HEMOGLOBIN: 14.2 g/dL (ref 13.0–17.0)
Lymphocytes Relative: 26 %
Lymphs Abs: 3 10*3/uL (ref 0.7–4.0)
MCH: 32.9 pg (ref 26.0–34.0)
MCHC: 33.8 g/dL (ref 30.0–36.0)
MCV: 97.4 fL (ref 78.0–100.0)
Monocytes Absolute: 0.9 10*3/uL (ref 0.1–1.0)
Monocytes Relative: 8 %
NEUTROS ABS: 6.7 10*3/uL (ref 1.7–7.7)
NEUTROS PCT: 57 %
Platelets: 250 10*3/uL (ref 150–400)
RBC: 4.31 MIL/uL (ref 4.22–5.81)
RDW: 20.3 % — ABNORMAL HIGH (ref 11.5–15.5)
WBC: 11.6 10*3/uL — ABNORMAL HIGH (ref 4.0–10.5)

## 2015-12-04 LAB — I-STAT CG4 LACTIC ACID, ED: LACTIC ACID, VENOUS: 1.76 mmol/L (ref 0.5–1.9)

## 2015-12-04 LAB — COMPREHENSIVE METABOLIC PANEL
ALBUMIN: 2.8 g/dL — AB (ref 3.5–5.0)
ALT: 113 U/L — ABNORMAL HIGH (ref 17–63)
ANION GAP: 7 (ref 5–15)
AST: 176 U/L — ABNORMAL HIGH (ref 15–41)
Alkaline Phosphatase: 409 U/L — ABNORMAL HIGH (ref 38–126)
BILIRUBIN TOTAL: 4.8 mg/dL — AB (ref 0.3–1.2)
BUN: 15 mg/dL (ref 6–20)
CHLORIDE: 104 mmol/L (ref 101–111)
CO2: 26 mmol/L (ref 22–32)
Calcium: 8.9 mg/dL (ref 8.9–10.3)
Creatinine, Ser: 1.68 mg/dL — ABNORMAL HIGH (ref 0.61–1.24)
GFR calc Af Amer: 48 mL/min — ABNORMAL LOW (ref >60)
GFR, EST NON AFRICAN AMERICAN: 42 mL/min — AB (ref >60)
Glucose, Bld: 124 mg/dL — ABNORMAL HIGH (ref 65–99)
POTASSIUM: 4.8 mmol/L (ref 3.5–5.1)
Sodium: 137 mmol/L (ref 135–145)
TOTAL PROTEIN: 7 g/dL (ref 6.5–8.1)

## 2015-12-04 LAB — PROTIME-INR
INR: 1.09
PROTHROMBIN TIME: 14.1 s (ref 11.4–15.2)

## 2015-12-04 LAB — APTT: APTT: 37 s — AB (ref 24–36)

## 2015-12-04 LAB — AMMONIA: Ammonia: 49 umol/L — ABNORMAL HIGH (ref 9–35)

## 2015-12-04 NOTE — ED Triage Notes (Signed)
Patient complains of increased confusion and jaundice the past 4 days. Had labs 2 days ago for his Hep B and C and was told his liver enzymes have changed. Denies pain. Alert and oriented on arrival

## 2015-12-04 NOTE — ED Notes (Signed)
Pt showing NAD. RR even and unlabored. Family at bedside. Voices no questions/concerns at this time. 

## 2015-12-04 NOTE — Telephone Encounter (Signed)
Juan Wiggins came in the other day for labs for his hep C. A CMP was also ordered to monitor his liver flare since he also has hep B. He started on his Epclusa on 9/7 and Viread on 9/9. His liver enzymes have jumped up severely. We called to see how he is doing and he stated that he has noticed yellowing of the skin and dark urine. After discussing with Dr. Baxter Flattery, we advised him to go to the ED to get eval for his liver impairment.

## 2015-12-04 NOTE — Telephone Encounter (Signed)
Patient had labs on 10/9 which showed increase in AST/ALT and Tbili from a normal baseline. He was recently started on epclusa (hep C treatment) and tenofovir (hep B treatment) in the past 4 weeks.   He reports being jaundiced (which we would expect at tbili 6) but otherwise ok.  I have asked him to go to the ED for further evaluation to see if likely having flare but more likely having drug interaction. I have spoken to the ED regarding Mr. Wigginton and why I have asked him to come to the ED  - recommend to have ED check CBC, CMP, PT/PTT/INR, and physical exam - please have him stop epclusa (hep C treatment)  Depending on his labs, he may need to be admitted for further evaluation vs. Discharged home with close observation/follow up in the clinic

## 2015-12-04 NOTE — ED Notes (Addendum)
Called pt name for vitals pt is in xray.

## 2015-12-04 NOTE — ED Provider Notes (Signed)
[facility] DEPT Provider Note   CSN: [id number] Arrival date & time: 12/04/15  1808     History   Chief Complaint No chief complaint on file.   HPI Juan Wiggins is a 65 y.o. male.  Patient presents to the ED with a chief complaint of jaundice.  He states that he is currently being treated for Hep B and C.  He is followed by Dr. Baxter Flattery, of ID, who sent the patient here after getting report of elevated LFTs and Tbili.  Patient denies any fever. Denies any abdominal pain. He states that he is "actually feeling quite well today."  He is currently taking Epclusa and Viread.  He states that he did have an episode of confusion over the weekend, but attributes this to starting Wellbutrin.  He states that he quit the Wellbutrin and as soon as it was out of his system he was back to normal.  He denies any other associated symptoms.   The history is provided by the patient. No language interpreter was used.    Past Medical History:  Diagnosis Date  . Anxiety   . Arthritis    RHEUMATOID ARTHRITIS; OA LEFT KNEE  . Cancer (HCC)    MELANOMA REMOVED RT SHOULDER  . Depression   . GERD (gastroesophageal reflux disease)    PREVACID IF NEEDED  . Heart murmur   . Hemorrhoids   . Hepatitis A    PT STATES TYPE OF HEPATITIS YOU GET FROM SHELLFISH  . Hepatitis B   . Hepatitis C   . Hypertension   . Hypothyroidism   . 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 Pancreatitis, alcoholic, acute   . Sleep apnea    CLAUSTROPHOBIC - COULD NOT TOLERATE CPAP MASK  . Tachycardia    PT STATES HIS HEART RATE USUALLY 100 OR MORE  . Thyroid disease     Patient Active Problem List   Diagnosis Date Noted  . Septic arthritis of knee, right ([place]) 07/18/2015  . Bacteremia due to Streptococcus 07/18/2015  . Cellulitis of right upper extremity 07/18/2015  . Hepatitis C 07/18/2015  . Anemia 07/18/2015  . Opacity of lung on imaging study, bilateral 07/18/2015  .  Anxiety state 07/18/2015  . History of total knee arthroplasty 07/16/2015  . Severe sepsis ([place]) 07/14/2015  . Chronic pain syndrome 05/21/2014  . Essential hypertension, benign 08/28/2013  . GERD (gastroesophageal reflux disease) 08/28/2013  . Osteoarthritis of left knee 08/23/2013  . Total knee replacement status 08/23/2013  . Alcohol dependence ([place]) 07/14/2013  . Alcohol dependency ([place]) 07/07/2013  . Major depression 07/07/2013  . Varicose veins of lower extremities with other complications [id number]  . Recurrent falls 04/14/2011  . Hypotension 04/13/2011  . ARF (acute renal failure) ([place]) 04/13/2011  . Hypothyroidism 04/13/2011  . Prolapsed and thrombosed 09/03/2010    Past Surgical History:  Procedure Laterality Date  . colonscopy     . ELBOW SURGERY     BILATERAL ELBOW SURGERY  . I&D KNEE WITH POLY EXCHANGE Right 07/16/2015   Procedure: IRRIGATION AND DEBRIDEMENT RIGHT KNEE WITH POLY EXCHANGE AND PLACEMENT OF ANTIBIOTIC BEADS;  Surgeon: Latanya Maudlin, MD;  Location: [facility];  Service: Orthopedics;  Laterality: Right;  . KNEE ARTHROSCOPY Right 07/14/2015   Procedure: ARTHROSCOPY IRRIGATION AND DEBRIDEMENT - KNEE;  Surgeon: Justice Britain, MD;  Location: [facility];  Service: Orthopedics;  Laterality: Right;  . KNEE SURGERY     BILATERAL  KNEE ARTHROSCOPY  . left TKR     July 2015  . LUMBAR LAMINECTOMY/DECOMPRESSION MICRODISCECTOMY Left 06/06/2014   Procedure: LEFT L4-5 DECOMPRESSION ;  Surgeon: Melina Schools, MD;  Location: [facility];  Service: Orthopedics;  Laterality: Left;  . REFRACTIVE SURGERY    . SHOULDER SURGERY     RIGHT ROTATOR CUFF REPAIR AND LEFT ARTHROSCOPY  . TOTAL KNEE ARTHROPLASTY Left 08/23/2013   Procedure: LEFT TOTAL KNEE ARTHROPLASTY;  Surgeon: Tobi Bastos, MD;  Location: [facility];  Service: Orthopedics;  Laterality: Left;  . TOTAL KNEE ARTHROPLASTY Right 02/07/2014   Procedure: RIGHT TOTAL KNEE ARTHROPLASTY;  Surgeon: Tobi Bastos, MD;  Location: [facility];   Service: Orthopedics;  Laterality: Right;       Home Medications    Prior to Admission medications   Medication Sig Start Date End Date Taking? Authorizing Provider  acetaminophen (TYLENOL) 325 MG tablet Take 650 mg by mouth every 6 (six) hours as needed for headache.    Yes Historical Provider, MD  albuterol (PROVENTIL HFA;VENTOLIN HFA) 108 (90 Base) MCG/ACT inhaler Inhale 2-4 puffs into the lungs daily as needed. 04/25/15  Yes Historical Provider, MD  ALPRAZolam Duanne Moron) 0.25 MG tablet Take 0.25 mg by mouth 3 (three) times daily as needed for anxiety.   Yes Historical Provider, MD  amoxicillin (AMOXIL) 500 MG capsule Take 500 mg by mouth 3 (three) times daily.   Yes Historical Provider, MD  aspirin 325 MG tablet Take 1 tablet (325 mg total) by mouth 2 (two) times daily. Patient taking differently: Take 325 mg by mouth daily.  07/21/15  Yes Nita Sells, MD  Cholecalciferol 2000 units CAPS Take 6,000 Units by mouth daily.    Yes Historical Provider, MD  escitalopram (LEXAPRO) 20 MG tablet Take 20 mg by mouth at bedtime.    Yes Historical Provider, MD  levothyroxine (SYNTHROID, LEVOTHROID) 200 MCG tablet Take 200 mcg by mouth daily before breakfast.   Yes Historical Provider, MD  promethazine (PHENERGAN) 12.5 MG tablet Take 25 mg by mouth every 6 (six) hours as needed for nausea or vomiting.    Yes Historical Provider, MD  propranolol ER (INDERAL LA) 60 MG 24 hr capsule Take 60 mg by mouth at bedtime.  01/30/15 01/30/16 Yes Historical Provider, MD  silodosin (RAPAFLO) 8 MG CAPS capsule Take 8 mg by mouth at bedtime.   Yes Historical Provider, MD  Sofosbuvir-Velpatasvir (EPCLUSA) 400-100 MG TABS Take 1 tablet by mouth daily. 10/24/15  Yes Carlyle Basques, MD  Sofosbuvir-Velpatasvir 400-100 MG TABS Take 1 tablet by mouth every morning. 12/04/14  Yes Historical Provider, MD  tenofovir (VIREAD) 300 MG tablet Take 1 tablet (300 mg total) by mouth daily. 10/30/15  Yes Carlyle Basques, MD  valACYclovir  (VALTREX) 1000 MG tablet Take 1,000 mg by mouth daily as needed (for flares).  04/08/15  Yes Historical Provider, MD  zolpidem (AMBIEN) 10 MG tablet Take 1 tablet (10 mg total) by mouth at bedtime as needed for sleep. 11/18/15 12/18/15 Yes Norman Clay, MD  amoxicillin (AMOXIL) 500 MG capsule Take 1 capsule (500 mg total) by mouth 3 (three) times daily. 09/03/15   Carlyle Basques, MD  buPROPion (WELLBUTRIN XL) 150 MG 24 hr tablet Take 1 tablet (150 mg total) by mouth every morning. 11/18/15 11/17/16  Norman Clay, MD  cholecalciferol (VITAMIN D) 1000 UNITS tablet Take 3,000 Units by mouth daily.    Historical Provider, MD  dicyclomine (BENTYL) 20 MG tablet Take 1 tablet (20 mg total) by mouth 2 (  two) times daily. 10/22/14   Tatyana Kirichenko, PA-C  ferrous sulfate 325 (65 FE) MG tablet Take 1 tablet (325 mg total) by mouth 2 (two) times daily with a meal. Patient not taking: Reported on 11/18/2015 07/21/15   Nita Sells, MD  ibuprofen (ADVIL,MOTRIN) 200 MG tablet Take 400 mg by mouth every 6 (six) hours as needed for mild pain.    Historical Provider, MD  levothyroxine (SYNTHROID, LEVOTHROID) 175 MCG tablet Take 175 mcg by mouth at bedtime.     Historical Provider, MD  loperamide (IMODIUM) 2 MG capsule Take 2 capsules (4 mg total) by mouth every 6 (six) hours. Patient not taking: Reported on 11/18/2015 07/21/15   Nita Sells, MD  methocarbamol (ROBAXIN) 500 MG tablet Take 1 tablet (500 mg total) by mouth every 6 (six) hours as needed for muscle spasms. Patient not taking: Reported on 11/18/2015 07/21/15   Nita Sells, MD  OVER THE COUNTER MEDICATION Wetting solution: Instill into both eyes daily as needed (wears contacts)    Historical Provider, MD  oxyCODONE 10 MG TABS Take 1 tablet (10 mg total) by mouth every 4 (four) hours as needed for severe pain. 07/21/15   Nita Sells, MD  sucralfate (CARAFATE) 1 G tablet Take 1 tablet (1 g total) by mouth 4 (four) times daily -  with meals  and at bedtime. Patient not taking: Reported on 11/18/2015 12/01/14   Domenic Moras, PA-C    Family History Family History  Problem Relation Age of Onset  . Hypertension Mother   . Other Mother     varicose veins  . Hypertension Sister     Social History Social History  Substance Use Topics  . Smoking status: Former Smoker    Packs/day: 1.50    Years: 35.00    Types: Cigars    Quit date: 02/22/1998  . Smokeless tobacco: Never Used     Comment: used cigarettes 20 years ago  . Alcohol use No     Comment: " last alcohol use was in December 2015."     Allergies   Heparin and Wellbutrin [bupropion]   Review of Systems Review of Systems  All other systems reviewed and are negative.    Physical Exam Updated Vital Signs BP 111/60   Pulse (!) 48   Temp 98 F (36.7 C) (Oral)   Resp 16   Ht 5\' 11"  (1.803 m)   Wt 88 kg   SpO2 99%   BMI 27.06 kg/m   Physical Exam  Constitutional: He is oriented to person, place, and time. He appears well-developed and well-nourished.  HENT:  Head: Normocephalic and atraumatic.  Mild jaundice  Eyes: Conjunctivae and EOM are normal. Pupils are equal, round, and reactive to light. Right eye exhibits no discharge. Left eye exhibits no discharge. No scleral icterus.  Neck: Normal range of motion. Neck supple. No JVD present.  Cardiovascular: Normal rate, regular rhythm and normal heart sounds.  Exam reveals no gallop and no friction rub.   No murmur heard. Pulmonary/Chest: Effort normal and breath sounds normal. No respiratory distress. He has no wheezes. He has no rales. He exhibits no tenderness.  Abdominal: Soft. He exhibits no distension and no mass. There is no tenderness. There is no rebound and no guarding.  No focal abdominal tenderness, no RLQ tenderness or pain at McBurney's point, no RUQ tenderness or Murphy's sign, no left-sided abdominal tenderness, no fluid wave, or signs of peritonitis   Musculoskeletal: Normal range of motion.  He exhibits no  edema or tenderness.  No asterixis  Moves all extremities  Neurological: He is alert and oriented to person, place, and time.  A&Ox4 Responds to all question appropriately  Skin: Skin is warm and dry.  Psychiatric: He has a normal mood and affect. His behavior is normal. Judgment and thought content normal.  Nursing note and vitals reviewed.    ED Treatments / Results  Labs (all labs ordered are listed, but only abnormal results are displayed) Labs Reviewed  COMPREHENSIVE METABOLIC PANEL - Abnormal; Notable for the following:       Result Value   Glucose, Bld 124 (*)    Creatinine, Ser 1.68 (*)    Albumin 2.8 (*)    AST 176 (*)    ALT 113 (*)    Alkaline Phosphatase 409 (*)    Total Bilirubin 4.8 (*)    GFR calc non Af Amer 42 (*)    GFR calc Af Amer 48 (*)    All other components within normal limits  CBC WITH DIFFERENTIAL/PLATELET - Abnormal; Notable for the following:    WBC 11.6 (*)    RDW 20.3 (*)    Eosinophils Absolute 0.9 (*)    Basophils Absolute 0.2 (*)    All other components within normal limits  AMMONIA - Abnormal; Notable for the following:    Ammonia 49 (*)    All other components within normal limits  CULTURE, BLOOD (ROUTINE X 2)  CULTURE, BLOOD (ROUTINE X 2)  URINE CULTURE  URINALYSIS, ROUTINE W REFLEX MICROSCOPIC (NOT AT [facility])  AFP TUMOR MARKER  PROTIME-INR  APTT  I-STAT CG4 LACTIC ACID, ED  I-STAT CG4 LACTIC ACID, ED    EKG  EKG Interpretation None       Radiology Dg Chest 2 View  Result Date: 12/04/2015 CLINICAL DATA:  Confusion.  Jaundice. EXAM: CHEST  2 VIEW COMPARISON:  07/14/2015 FINDINGS: The heart size and mediastinal contours are within normal limits. Both lungs are clear. The visualized skeletal structures are unremarkable. IMPRESSION: No active cardiopulmonary disease. Electronically Signed   By: Kerby Moors M.D.   On: 12/04/2015 19:16   US Abdomen Limited Ruq  Result Date: 12/04/2015 CLINICAL DATA:  Right  upper quadrant pain and jaundice, history of hepatitis-B and hepatitis-C EXAM: US ABDOMEN LIMITED - RIGHT UPPER QUADRANT COMPARISON:  12/01/2014 FINDINGS: Gallbladder: Gallbladder is well distended. No pericholecystic fluid is noted. Negative sonographic Percell Miller sign is seen. Common bile duct: Diameter: 9 mm but tapers normally distally. Liver: 7 mm cyst is noted within the right lobe anteriorly. Mild nodularity is noted. This is likely related to the underlying history of hepatitis. IMPRESSION: Well distended gallbladder without signs of cholecystitis. Mildly dilated common bile duct although tapers normally distally. Electronically Signed   By: Inez Catalina M.D.   On: 12/04/2015 21:53    Procedures Procedures (including critical care time)  Medications Ordered in ED Medications - No data to display   Initial Impression / Assessment and Plan / ED Course  I have reviewed the triage vital signs and the nursing notes.  Pertinent labs & imaging results that were available during my care of the patient were reviewed by me and considered in my medical decision making (see chart for details).  Clinical Course    Patient with elevated LFTs.  Sent to the ED by Dr. Baxter Flattery for repeat labs and [place].  LFTs and 2 biliary are down trending from 2 days ago. Patient states he feels well. He has no right upper quadrant  pain. Ultrasound of right upper quadrant shows 7 mm hepatic cyst and gallbladder wall thickening without evidence of cholecystitis or cholelithiasis.    I discussed the patient with Dr. Baxter Flattery, who agrees that the patient's labs look good and are down trending and is appropriate for close follow-up in clinic. Patient is alert and oriented. Is in no apparent distress. He has close follow-up with Dr. Baxter Flattery. Dr. Baxter Flattery has asked that we discontinue his Hep C meds for now.    Additionally, patient states that he thinks his BP meds are making him dizzy.  He BP has been lower than normal.  I will  recommend that he hold this until seen by his PCP.  Final Clinical Impressions(s) / ED Diagnoses   Final diagnoses:  Jaundice  Abdominal pain    New Prescriptions Discharge Medication List as of 12/04/2015 11:45 PM       Montine Circle, PA-C 12/05/15 [id number]    Charlesetta Shanks, MD 12/05/15 1644

## 2015-12-04 NOTE — ED Triage Notes (Signed)
Dr synder of infectious disease has calledd  She wants a ultrasiound of the liver to r/o liver mass

## 2015-12-04 NOTE — Discharge Instructions (Signed)
Please call Dr. Storm Frisk office tomorrow to schedule a close follow-up appointment.  Please discontinue your Epclusa.  Please hold your propranolol and make an appointment with your primary care doctor to review this.

## 2015-12-05 ENCOUNTER — Other Ambulatory Visit: Payer: Self-pay | Admitting: Pharmacist Clinician (PhC)/ Clinical Pharmacy Specialist

## 2015-12-05 DIAGNOSIS — B182 Chronic viral hepatitis C: Secondary | ICD-10-CM

## 2015-12-06 ENCOUNTER — Encounter: Payer: Self-pay | Admitting: Internal Medicine

## 2015-12-06 ENCOUNTER — Other Ambulatory Visit: Payer: 59

## 2015-12-06 LAB — COMPLETE METABOLIC PANEL WITH GFR
ALBUMIN: 3.1 g/dL — AB (ref 3.6–5.1)
ALK PHOS: 419 U/L — AB (ref 40–115)
ALT: 76 U/L — ABNORMAL HIGH (ref 9–46)
AST: 107 U/L — AB (ref 10–35)
BUN: 14 mg/dL (ref 7–25)
CO2: 28 mmol/L (ref 20–31)
Calcium: 8.7 mg/dL (ref 8.6–10.3)
Chloride: 103 mmol/L (ref 98–110)
Creat: 1.57 mg/dL — ABNORMAL HIGH (ref 0.70–1.25)
GFR, Est African American: 53 mL/min — ABNORMAL LOW (ref >=60)
GFR, Est Non African American: 46 mL/min — ABNORMAL LOW (ref >=60)
Glucose, Bld: 134 mg/dL — ABNORMAL HIGH (ref 65–99)
POTASSIUM: 4.7 mmol/L (ref 3.5–5.3)
Sodium: 136 mmol/L (ref 135–146)
Total Bilirubin: 3.9 mg/dL — ABNORMAL HIGH (ref 0.2–1.2)
Total Protein: 6.5 g/dL (ref 6.1–8.1)

## 2015-12-06 LAB — AFP TUMOR MARKER: AFP TUMOR MARKER: 6.7 ng/mL (ref 0.0–8.3)

## 2015-12-07 LAB — PROTIME-INR
INR: 1.1
Prothrombin Time: 11.6 s — ABNORMAL HIGH (ref 9.0–11.5)

## 2015-12-09 LAB — CULTURE, BLOOD (ROUTINE X 2)
CULTURE: NO GROWTH
Culture: NO GROWTH

## 2015-12-11 LAB — HEPATITIS B DNA, ULTRAQUANTITATIVE, PCR
Hepatitis B DNA (Calc): 2.52 Log IU/mL — ABNORMAL HIGH (ref <1.30)
Hepatitis B DNA: 328 IU/mL — ABNORMAL HIGH (ref <20)

## 2015-12-16 ENCOUNTER — Ambulatory Visit (INDEPENDENT_AMBULATORY_CARE_PROVIDER_SITE_OTHER): Payer: 59 | Admitting: Pharmacist Clinician (PhC)/ Clinical Pharmacy Specialist

## 2015-12-16 DIAGNOSIS — B182 Chronic viral hepatitis C: Secondary | ICD-10-CM | POA: Diagnosis not present

## 2015-12-16 MED ORDER — ONDANSETRON HCL 4 MG PO TABS: 4.0000 mg | ORAL_TABLET | Freq: Three times a day (TID) | ORAL | 0 refills | Status: DC | PRN

## 2015-12-16 MED FILL — ONDANSETRON HCL 4 MG TABLET: 4 | 7 days supply | Qty: 20 | Fill #0

## 2015-12-16 NOTE — Patient Instructions (Addendum)
Start taking Viread every day instead of every other day. Attend your ultrasound appointment tomorrow (10/24 at 10:00 AM) Follow-up with pharmacy in 2 weeks (November 7th at 2:00 PM)

## 2015-12-16 NOTE — Progress Notes (Signed)
Agreed with Emily's note. Physical appearance looks much less yellow. We are going to change his TDF to daily for now. If his scr fluctuates up, we'll keep at q48 when he comes back for labs in 2 wks.

## 2015-12-16 NOTE — Progress Notes (Signed)
HPI: Juan Wiggins is a 65 y.o. male who presents to the clinic today for a follow-up on his Hepatitis B/C co-infection..   Lab Results  Component Value Date   HCVGENOTYPE 2a/2c 07/19/2015    Allergies: Allergies  Allergen Reactions  . Heparin Other (See Comments)    Low platelets (130s), no HIT testing performed, platelets recovered.  . Wellbutrin [Bupropion] Other (See Comments)    Hallucinations-started on 150mg  daily dosage     Past Medical History: Past Medical History:  Diagnosis Date  . Anxiety   . Arthritis    RHEUMATOID ARTHRITIS; OA LEFT KNEE  . Cancer (HCC)    MELANOMA REMOVED RT SHOULDER  . Depression   . GERD (gastroesophageal reflux disease)    PREVACID IF NEEDED  . Heart murmur   . Hemorrhoids   . Hepatitis A    PT STATES TYPE OF HEPATITIS YOU GET FROM SHELLFISH  . Hepatitis B   . Hepatitis C   . Hypertension   . Hypothyroidism   . 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 Pancreatitis, alcoholic, acute   . Sleep apnea    CLAUSTROPHOBIC - COULD NOT TOLERATE CPAP MASK  . Tachycardia    PT STATES HIS HEART RATE USUALLY 100 OR MORE  . Thyroid disease     Social History: Social History   Social History  . Marital status: Legally Separated    Spouse name: N/A  . Number of children: N/A  . Years of education: N/A   Social History Main Topics  . Smoking status: Former Smoker    Packs/day: 1.50    Years: 35.00    Types: Cigars    Quit date: 02/22/1998  . Smokeless tobacco: Never Used     Comment: used cigarettes 20 years ago  . Alcohol use No     Comment: " last alcohol use was in December 2015."  . Drug use: No  . Sexual activity: Yes    Birth control/ protection: None   Other Topics Concern  . Not on file   Social History Narrative  . No narrative on file    Labs: Hep B S Ab (no units)  Date Value  09/03/2015 NEG   Hepatitis B Surface Ag (no units)  Date Value  09/03/2015 POSITIVE (A)     Lab Results  Component Value Date   HCVGENOTYPE 2a/2c 07/19/2015    Hepatitis C RNA quantitative Latest Ref Rng & Units 12/02/2015 07/19/2015  HCV Quantitative <15 IU/mL Not Detected 8,190,000  HCV Quantitative Log <1.18 log 10 NOT CALC 6.913    AST (U/L)  Date Value  12/06/2015 107 (H)  12/04/2015 176 (H)  12/02/2015 213 (H)   ALT (U/L)  Date Value  12/06/2015 76 (H)  12/04/2015 113 (H)  12/02/2015 130 (H)  09/03/2015 13   INR (no units)  Date Value  12/06/2015 1.1  12/04/2015 1.09  09/03/2015 1.0    CrCl: Estimated Creatinine Clearance: 51.3 mL/min (by C-G formula based on SCr of 1.57 mg/dL (H)).  Fibrosis Score: F0/F1  as assessed by fibrosure.  Child-Pugh Score: A   Assessment: Juan Wiggins started his Epclusa on 9/7 and the Viread on 9/9. He reports no issues with compliance and has been taking his Epclusa every day and his Viread every other day. His Viread was switched to every other day at the beginning of October due to a serum creatinine level of 1.68 on October 11th.  His creatinine was back down to 1.57 on 10/13 making his clearance about 60 ml/min. For this reason we readjusted his Viread to daily. He also complained of some nausea. We prescribed Zofran for him in addition to his Phenergan with instructions to try the Zofran first and then take the Phenergan if he is still feeling nauseous. We checked with Juan Wiggins will have no copay for the Zofran.  We will collect a CMET, INR, and bilirubin on him today and have him follow-up in 2 weeks.   Recommendations: Start taking Viread daily instead of every other day.  Take Zofran for nausea as needed.   Attend ultrasound appointment on 10/24 at 10:00 AM.  Follow-up with pharmacy on November 7th at 2:00 Juan Wiggins, Pharm.D. PGY1 Pharmacy Resident  Pager: [phone] 12/16/2015, 2:24 PM

## 2015-12-17 ENCOUNTER — Ambulatory Visit (HOSPITAL_COMMUNITY)
Admission: RE | Admit: 2015-12-17 | Discharge: 2015-12-17 | Disposition: A | Discharge status: Home / Self Care | Payer: 59 | Source: Ambulatory Visit | Attending: Internal Medicine | Admitting: Internal Medicine

## 2015-12-17 DIAGNOSIS — B182 Chronic viral hepatitis C: Secondary | ICD-10-CM | POA: Insufficient documentation

## 2015-12-17 DIAGNOSIS — K7689 Other specified diseases of liver: Secondary | ICD-10-CM | POA: Diagnosis not present

## 2015-12-17 DIAGNOSIS — N281 Cyst of kidney, acquired: Secondary | ICD-10-CM | POA: Insufficient documentation

## 2015-12-17 LAB — PROTIME-INR
INR: 1.1
PROTHROMBIN TIME: 11.6 s — AB (ref 9.0–11.5)

## 2015-12-17 LAB — COMPLETE METABOLIC PANEL WITH GFR
ALT: 34 U/L (ref 9–46)
AST: 67 U/L — ABNORMAL HIGH (ref 10–35)
Albumin: 3.7 g/dL (ref 3.6–5.1)
Alkaline Phosphatase: 284 U/L — ABNORMAL HIGH (ref 40–115)
BILIRUBIN TOTAL: 2.6 mg/dL — AB (ref 0.2–1.2)
BUN: 15 mg/dL (ref 7–25)
CALCIUM: 9.5 mg/dL (ref 8.6–10.3)
CHLORIDE: 99 mmol/L (ref 98–110)
CO2: 24 mmol/L (ref 20–31)
Creat: 1.38 mg/dL — ABNORMAL HIGH (ref 0.70–1.25)
GFR, EST AFRICAN AMERICAN: 62 mL/min (ref >=60)
GFR, EST NON AFRICAN AMERICAN: 54 mL/min — AB (ref >=60)
Glucose, Bld: 95 mg/dL (ref 65–99)
Potassium: 4.7 mmol/L (ref 3.5–5.3)
Sodium: 137 mmol/L (ref 135–146)
TOTAL PROTEIN: 7.5 g/dL (ref 6.1–8.1)

## 2015-12-17 LAB — BILIRUBIN, TOTAL: BILIRUBIN TOTAL: 2.6 mg/dL — AB (ref 0.2–1.2)

## 2015-12-17 NOTE — Progress Notes (Deleted)
[facility] MD/PA/NP OP Progress Note  12/17/2015 10:54 AM Juan Wiggins  MRN:  [id number]  Chief Complaint:  Subjective:  *** HPI: *** Visit Diagnosis: No diagnosis found.  Past Psychiatric History:  Never seen psychiatrist, no previous admission  Past Medical History:  Past Medical History:  Diagnosis Date  . Anxiety   . Arthritis    RHEUMATOID ARTHRITIS; OA LEFT KNEE  . Cancer (HCC)    MELANOMA REMOVED RT SHOULDER  . Depression   . GERD (gastroesophageal reflux disease)    PREVACID IF NEEDED  . Heart murmur   . Hemorrhoids   . Hepatitis A    PT STATES TYPE OF HEPATITIS YOU GET FROM SHELLFISH  . Hepatitis B   . Hepatitis C   . Hypertension   . Hypothyroidism   . 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 Pancreatitis, alcoholic, acute   . Sleep apnea    CLAUSTROPHOBIC - COULD NOT TOLERATE CPAP MASK  . Tachycardia    PT STATES HIS HEART RATE USUALLY 100 OR MORE  . Thyroid disease     Past Surgical History:  Procedure Laterality Date  . colonscopy     . ELBOW SURGERY     BILATERAL ELBOW SURGERY  . I&D KNEE WITH POLY EXCHANGE Right 07/16/2015   Procedure: IRRIGATION AND DEBRIDEMENT RIGHT KNEE WITH POLY EXCHANGE AND PLACEMENT OF ANTIBIOTIC BEADS;  Surgeon: Latanya Maudlin, MD;  Location: [facility];  Service: Orthopedics;  Laterality: Right;  . KNEE ARTHROSCOPY Right 07/14/2015   Procedure: ARTHROSCOPY IRRIGATION AND DEBRIDEMENT - KNEE;  Surgeon: Justice Britain, MD;  Location: [facility];  Service: Orthopedics;  Laterality: Right;  . KNEE SURGERY     BILATERAL KNEE ARTHROSCOPY  . left TKR     July 2015  . LUMBAR LAMINECTOMY/DECOMPRESSION MICRODISCECTOMY Left 06/06/2014   Procedure: LEFT L4-5 DECOMPRESSION ;  Surgeon: Melina Schools, MD;  Location: [facility];  Service: Orthopedics;  Laterality: Left;  . REFRACTIVE SURGERY    . SHOULDER SURGERY     RIGHT ROTATOR CUFF REPAIR AND LEFT ARTHROSCOPY  . TOTAL KNEE ARTHROPLASTY Left 08/23/2013   Procedure: LEFT  TOTAL KNEE ARTHROPLASTY;  Surgeon: Tobi Bastos, MD;  Location: [facility];  Service: Orthopedics;  Laterality: Left;  . TOTAL KNEE ARTHROPLASTY Right 02/07/2014   Procedure: RIGHT TOTAL KNEE ARTHROPLASTY;  Surgeon: Tobi Bastos, MD;  Location: [facility];  Service: Orthopedics;  Laterality: Right;    Family Psychiatric History: denies mental health issues, denies suicide attempt  Family History:  Family History  Problem Relation Age of Onset  . Hypertension Mother   . Other Mother     varicose veins  . Hypertension Sister     Social History:  Social History   Social History  . Marital status: Legally Separated    Spouse name: N/A  . Number of children: N/A  . Years of education: N/A   Social History Main Topics  . Smoking status: Former Smoker    Packs/day: 1.50    Years: 35.00    Types: Cigars    Quit date: 02/22/1998  . Smokeless tobacco: Never Used     Comment: used cigarettes 20 years ago  . Alcohol use No     Comment: " last alcohol use was in December 2015."  . Drug use: No  . Sexual activity: Yes    Birth control/ protection: None   Other Topics Concern  . Not on file  Social History Narrative  . No narrative on file    Allergies:  Allergies  Allergen Reactions  . Heparin Other (See Comments)    Low platelets (130s), no HIT testing performed, platelets recovered.  . Wellbutrin [Bupropion] Other (See Comments)    Hallucinations-started on 150mg  daily dosage    Metabolic Disorder Labs: No results found for: HGBA1C, MPG No results found for: PROLACTIN Lab Results  Component Value Date   TRIG 88 05/22/2014     Current Medications: Current Outpatient Prescriptions  Medication Sig Dispense Refill  . acetaminophen (TYLENOL) 325 MG tablet Take 650 mg by mouth every 6 (six) hours as needed for headache.     . albuterol (PROVENTIL HFA;VENTOLIN HFA) 108 (90 Base) MCG/ACT inhaler Inhale 2-4 puffs into the lungs daily as needed for shortness of breath.      . ALPRAZolam (XANAX) 0.25 MG tablet Take 0.25 mg by mouth 3 (three) times daily as needed for anxiety.    Marland Kitchen amoxicillin (AMOXIL) 500 MG capsule Take 1 capsule (500 mg total) by mouth 3 (three) times daily. (Patient not taking: Reported on 12/16/2015) 90 capsule 4  . aspirin 325 MG tablet Take 1 tablet (325 mg total) by mouth 2 (two) times daily. (Patient taking differently: Take 325 mg by mouth daily. ) 60 tablet 0  . buPROPion (WELLBUTRIN XL) 150 MG 24 hr tablet Take 1 tablet (150 mg total) by mouth every morning. (Patient not taking: Reported on 12/16/2015) 30 tablet 2  . Cholecalciferol 2000 units CAPS Take 6,000 Units by mouth daily.     Marland Kitchen CLINPRO 5000 1.1 % PSTE Take 1 application by mouth 2 (two) times a week.    . dicyclomine (BENTYL) 20 MG tablet Take 1 tablet (20 mg total) by mouth 2 (two) times daily. (Patient not taking: Reported on 12/16/2015) 20 tablet 0  . escitalopram (LEXAPRO) 20 MG tablet Take 20 mg by mouth at bedtime.     . ferrous sulfate 325 (65 FE) MG tablet Take 1 tablet (325 mg total) by mouth 2 (two) times daily with a meal. (Patient not taking: Reported on 12/16/2015) 60 tablet 2  . ibuprofen (ADVIL,MOTRIN) 200 MG tablet Take 400 mg by mouth every 6 (six) hours as needed for mild pain.    Marland Kitchen levothyroxine (SYNTHROID, LEVOTHROID) 200 MCG tablet Take 200 mcg by mouth daily before breakfast.    . loperamide (IMODIUM) 2 MG capsule Take 2 capsules (4 mg total) by mouth every 6 (six) hours. (Patient not taking: Reported on 12/16/2015) 30 capsule 0  . methocarbamol (ROBAXIN) 500 MG tablet Take 1 tablet (500 mg total) by mouth every 6 (six) hours as needed for muscle spasms. (Patient not taking: Reported on 12/16/2015) 20 tablet 0  . ondansetron (ZOFRAN) 4 MG tablet Take 1 tablet (4 mg total) by mouth every 8 (eight) hours as needed for nausea or vomiting. 20 tablet 0  . oxyCODONE 10 MG TABS Take 1 tablet (10 mg total) by mouth every 4 (four) hours as needed for severe pain.  (Patient taking differently: Take 10 mg by mouth 2 (two) times daily. ) 30 tablet 0  . promethazine (PHENERGAN) 12.5 MG tablet Take 25 mg by mouth every 6 (six) hours as needed for nausea or vomiting.     . propranolol ER (INDERAL LA) 60 MG 24 hr capsule Take 60 mg by mouth every morning.     . silodosin (RAPAFLO) 8 MG CAPS capsule Take 8 mg by mouth daily with breakfast.     .  sucralfate (CARAFATE) 1 G tablet Take 1 tablet (1 g total) by mouth 4 (four) times daily -  with meals and at bedtime. (Patient not taking: Reported on 12/16/2015) 10 tablet 0  . tenofovir (VIREAD) 300 MG tablet Take 1 tablet (300 mg total) by mouth daily. 30 tablet 6  . valACYclovir (VALTREX) 1000 MG tablet Take 1,000 mg by mouth daily as needed (for flares).   2  . zolpidem (AMBIEN) 10 MG tablet Take 1 tablet (10 mg total) by mouth at bedtime as needed for sleep. (Patient taking differently: Take 5 mg by mouth at bedtime. ) 30 tablet 0   No current facility-administered medications for this visit.     Neurologic: Headache: {BHH YES OR NO:22294} Seizure: {BHH YES OR NO:22294} Paresthesias: {BHH YES OR NO:22294}  Musculoskeletal: Strength & Muscle Tone: {desc; muscle tone:32375} Gait & Station: {PE GAIT ED QX:[id number] Patient leans: {Patient Leans:21022755}  Psychiatric Specialty Exam: ROS  There were no vitals taken for this visit.There is no height or weight on file to calculate BMI.  General Appearance: {Appearance:22683}  Eye Contact:  {BHH EYE CONTACT:22684}  Speech:  {Speech:22685}  Volume:  {Volume (PAA):22686}  Mood:  {BHH MOOD:22306}  Affect:  {Affect (PAA):22687}  Thought Process:  {Thought Process (PAA):22688}  Orientation:  {BHH ORIENTATION (PAA):22689}  Thought Content: {Thought Content:22690}   Suicidal Thoughts:  {ST/HT (PAA):22692}  Homicidal Thoughts:  {ST/HT (PAA):22692}  Memory:  {BHH MEMORY:22881}  Judgement:  {Judgement (PAA):22694}  Insight:  {Insight (PAA):22695}  Psychomotor  Activity:  {Psychomotor (PAA):22696}  Concentration:  {Concentration:21399}  Recall:  {BHH GOOD/FAIR/POOR:22877}  Fund of Knowledge: {BHH GOOD/FAIR/POOR:22877}  Language: {BHH GOOD/FAIR/POOR:22877}  Akathisia:  {BHH YES OR NO:22294}  Handed:  {Handed:22697}  AIMS (if indicated):  ***  Assets:  {Assets (PAA):22698}  ADL's:  {BHH TW:9249394  Cognition: {chl bhh cognition:304700322}  Sleep:  ***   Assessment 65 yo M with alcohol use, depression, alcohol use disorder in sustained remission, history of chronic hepatitis C, recently diagnosed Hep B,  streptococcal prosthetic joint infection, who presented for depression.  # MDD, recurrent, moderate without psychotic features Patient endorses neurovegetative symptoms in the setting of marital discordance and concern of unemployment. Will discontinue Symbyax given its limited effect. Will continue Escitalopram and start Bupropion to target his anhedonia/mood symptoms. Although he would be a great candidate for supportive psychotherapy/CBT, he declines this option at this time.   # Insomnia He reports night time awakening with pain. Will continue Ambien at this time. Discussed sleep hygiene. May consider adding mirtazapine in the future if insomnia persists despite improvement in his depression. Will not restart Xanax given its potential side effect of physical dependence; patient agreed with this plan.   # Alcohol use disorder, sustained remission Patient has been in sobriety for more than 2.5 years. Will continue to monitor and do motivational interview as needed.    Treatment Plan Summary: as below  1.Continue Escitalopram 20 mg daily 2. Start Wellbutrin 150 mg daily 3. Continue Ambien 5-10 mg qhsprn for sleep 4. Discontinue Symbyax 5. Return to clinic in a month  Treatment Plan Summary:{[facility] MD [place] [id number]   Norman Clay, MD 12/17/2015, 10:54 AM

## 2015-12-18 ENCOUNTER — Encounter (HOSPITAL_COMMUNITY): Payer: Self-pay | Admitting: Psychiatry

## 2015-12-18 ENCOUNTER — Ambulatory Visit (HOSPITAL_COMMUNITY): Payer: Self-pay | Admitting: Psychiatry

## 2015-12-18 ENCOUNTER — Ambulatory Visit (INDEPENDENT_AMBULATORY_CARE_PROVIDER_SITE_OTHER): Payer: 59 | Admitting: Psychiatry

## 2015-12-18 VITALS — BP 124/72 | HR 64 | Ht 71.0 in | Wt 184.4 lb

## 2015-12-18 DIAGNOSIS — R45851 Suicidal ideations: Secondary | ICD-10-CM

## 2015-12-18 DIAGNOSIS — Z87891 Personal history of nicotine dependence: Secondary | ICD-10-CM | POA: Diagnosis not present

## 2015-12-18 DIAGNOSIS — F331 Major depressive disorder, recurrent, moderate: Secondary | ICD-10-CM | POA: Diagnosis not present

## 2015-12-18 DIAGNOSIS — Z79891 Long term (current) use of opiate analgesic: Secondary | ICD-10-CM

## 2015-12-18 DIAGNOSIS — Z8249 Family history of ischemic heart disease and other diseases of the circulatory system: Secondary | ICD-10-CM

## 2015-12-18 DIAGNOSIS — Z888 Allergy status to other drugs, medicaments and biological substances status: Secondary | ICD-10-CM | POA: Diagnosis not present

## 2015-12-18 DIAGNOSIS — Z79899 Other long term (current) drug therapy: Secondary | ICD-10-CM

## 2015-12-18 MED ORDER — ZOLPIDEM TARTRATE 10 MG PO TABS: 10.0000 mg | ORAL_TABLET | Freq: Every evening | ORAL | 0 refills | Status: DC | PRN

## 2015-12-18 MED ORDER — MIRTAZAPINE 15 MG PO TABS: 15.0000 mg | ORAL_TABLET | Freq: Every day | ORAL | 0 refills | Status: DC

## 2015-12-18 MED ORDER — HYDROXYZINE HCL 25 MG PO TABS: 25.0000 mg | ORAL_TABLET | Freq: Three times a day (TID) | ORAL | 0 refills | Status: DC | PRN

## 2015-12-18 MED FILL — MIRTAZAPINE 15 MG TABLET: 15 | 30 days supply | Qty: 30 | Fill #0

## 2015-12-18 MED FILL — hydrOXYzine HCL 25 MG TABS: 25 | 20 days supply | Qty: 60 | Fill #0

## 2015-12-18 NOTE — Progress Notes (Addendum)
[facility] MD/PA/NP OP Progress Note  12/18/2015 5:31 PM Juan Wiggins  MRN:  [id number]  Chief Complaint:  Chief Complaint    Anxiety; Depression; Follow-up     Subjective:  "I will go to jail"  HPI:  Patient presents for follow-up appointment. Since the last encounter patient discontinued Wellbutrin given he had vomiting. He denies any nausea or vomiting since then. He continues to feel anxious and depressed about his divorce and upcoming legal issues; he states that he needs to go to jail on this Friday due to DWI for a week. He wants to have some medication for his anxiety, as he won't be able to take any controlled substance.  He has insomnia and takes Ambien 5-10 mg. He states he ran out of medication (which was dispensed for 30 days); he states that he might have spilled over some of medication. He has passive SI, although he denies intent or plans. He denies gun access at home. He denies AH, VH, except the time he saw faces when he was on Wellbutrin. He occasionally takes Xanax 0.25 mg at night for insomnia.  Visit Diagnosis:    ICD-9-CM ICD-10-CM   1. Moderate episode of recurrent major depressive disorder (HCC) 296.32 F33.1     Past Psychiatric History:  Never seen psychiatrist, no previous admission  Past Medical History:  Past Medical History:  Diagnosis Date  . Anxiety   . Arthritis    RHEUMATOID ARTHRITIS; OA LEFT KNEE  . Cancer (HCC)    MELANOMA REMOVED RT SHOULDER  . Depression   . GERD (gastroesophageal reflux disease)    PREVACID IF NEEDED  . Heart murmur   . Hemorrhoids   . Hepatitis A    PT STATES TYPE OF HEPATITIS YOU GET FROM SHELLFISH  . Hepatitis B   . Hepatitis C   . Hypertension   . Hypothyroidism   . 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 Pancreatitis, alcoholic, acute   . Sleep apnea    CLAUSTROPHOBIC - COULD NOT TOLERATE CPAP MASK  . Tachycardia    PT STATES HIS HEART RATE USUALLY 100 OR MORE  . Thyroid  disease     Past Surgical History:  Procedure Laterality Date  . colonscopy     . ELBOW SURGERY     BILATERAL ELBOW SURGERY  . I&D KNEE WITH POLY EXCHANGE Right 07/16/2015   Procedure: IRRIGATION AND DEBRIDEMENT RIGHT KNEE WITH POLY EXCHANGE AND PLACEMENT OF ANTIBIOTIC BEADS;  Surgeon: Latanya Maudlin, MD;  Location: [facility];  Service: Orthopedics;  Laterality: Right;  . KNEE ARTHROSCOPY Right 07/14/2015   Procedure: ARTHROSCOPY IRRIGATION AND DEBRIDEMENT - KNEE;  Surgeon: Justice Britain, MD;  Location: [facility];  Service: Orthopedics;  Laterality: Right;  . KNEE SURGERY     BILATERAL KNEE ARTHROSCOPY  . left TKR     July 2015  . LUMBAR LAMINECTOMY/DECOMPRESSION MICRODISCECTOMY Left 06/06/2014   Procedure: LEFT L4-5 DECOMPRESSION ;  Surgeon: Melina Schools, MD;  Location: [facility];  Service: Orthopedics;  Laterality: Left;  . REFRACTIVE SURGERY    . SHOULDER SURGERY     RIGHT ROTATOR CUFF REPAIR AND LEFT ARTHROSCOPY  . TOTAL KNEE ARTHROPLASTY Left 08/23/2013   Procedure: LEFT TOTAL KNEE ARTHROPLASTY;  Surgeon: Tobi Bastos, MD;  [facility];  Service: Orthopedics;  Laterality: Left;  . TOTAL KNEE ARTHROPLASTY Right 02/07/2014   Procedure: RIGHT TOTAL KNEE ARTHROPLASTY;  Surgeon: Tobi Bastos, MD;  Location: [facility];  Service: Orthopedics;  Laterality: Right;    Family Psychiatric History: denies mental health issues, denies suicide attempt  Family History:  Family History  Problem Relation Age of Onset  . Hypertension Mother   . Other Mother     varicose veins  . Hypertension Sister     Social History:  Social History   Social History  . Marital status: Legally Separated    Spouse name: N/A  . Number of children: N/A  . Years of education: N/A   Social History Main Topics  . Smoking status: Former Smoker    Packs/day: 1.50    Years: 35.00    Types: Cigars    Quit date: 02/22/1998  . Smokeless tobacco: Never Used     Comment: used cigarettes 20 years ago  . Alcohol use  No     Comment: " last alcohol use was in December 2015."  . Drug use: No  . Sexual activity: Yes    Birth control/ protection: None   Other Topics Concern  . None   Social History Narrative  . None    Allergies:  Allergies  Allergen Reactions  . Heparin Other (See Comments)    Low platelets (130s), no HIT testing performed, platelets recovered.  . Wellbutrin [Bupropion] Other (See Comments)    Hallucinations-started on 150mg  daily dosage    Metabolic Disorder Labs: No results found for: HGBA1C, MPG No results found for: PROLACTIN Lab Results  Component Value Date   TRIG 88 05/22/2014     Current Medications: Current Outpatient Prescriptions  Medication Sig Dispense Refill  . acetaminophen (TYLENOL) 325 MG tablet Take 650 mg by mouth every 6 (six) hours as needed for headache.     . albuterol (PROVENTIL HFA;VENTOLIN HFA) 108 (90 Base) MCG/ACT inhaler Inhale 2-4 puffs into the lungs daily as needed for shortness of breath.     Marland Kitchen amoxicillin (AMOXIL) 500 MG capsule Take 1 capsule (500 mg total) by mouth 3 (three) times daily. (Patient not taking: Reported on 12/16/2015) 90 capsule 4  . aspirin 325 MG tablet Take 1 tablet (325 mg total) by mouth 2 (two) times daily. (Patient taking differently: Take 325 mg by mouth daily. ) 60 tablet 0  . Cholecalciferol 2000 units CAPS Take 6,000 Units by mouth daily.     Marland Kitchen CLINPRO 5000 1.1 % PSTE Take 1 application by mouth 2 (two) times a week.    . dicyclomine (BENTYL) 20 MG tablet Take 1 tablet (20 mg total) by mouth 2 (two) times daily. (Patient not taking: Reported on 12/16/2015) 20 tablet 0  . escitalopram (LEXAPRO) 20 MG tablet Take 20 mg by mouth at bedtime.     . ferrous sulfate 325 (65 FE) MG tablet Take 1 tablet (325 mg total) by mouth 2 (two) times daily with a meal. (Patient not taking: Reported on 12/16/2015) 60 tablet 2  . hydrOXYzine (ATARAX/VISTARIL) 25 MG tablet Take 1 tablet (25 mg total) by mouth 3 (three) times daily as  needed. 60 tablet 0  . ibuprofen (ADVIL,MOTRIN) 200 MG tablet Take 400 mg by mouth every 6 (six) hours as needed for mild pain.    Marland Kitchen levothyroxine (SYNTHROID, LEVOTHROID) 200 MCG tablet Take 200 mcg by mouth daily before breakfast.    . loperamide (IMODIUM) 2 MG capsule Take 2 capsules (4 mg total) by mouth every 6 (six) hours. (Patient not taking: Reported on 12/16/2015) 30 capsule 0  . methocarbamol (ROBAXIN) 500 MG tablet Take 1 tablet (  500 mg total) by mouth every 6 (six) hours as needed for muscle spasms. (Patient not taking: Reported on 12/16/2015) 20 tablet 0  . mirtazapine (REMERON) 15 MG tablet Take 1 tablet (15 mg total) by mouth at bedtime. 30 tablet 0  . ondansetron (ZOFRAN) 4 MG tablet Take 1 tablet (4 mg total) by mouth every 8 (eight) hours as needed for nausea or vomiting. 20 tablet 0  . oxyCODONE 10 MG TABS Take 1 tablet (10 mg total) by mouth every 4 (four) hours as needed for severe pain. (Patient taking differently: Take 10 mg by mouth 2 (two) times daily. ) 30 tablet 0  . promethazine (PHENERGAN) 12.5 MG tablet Take 25 mg by mouth every 6 (six) hours as needed for nausea or vomiting.     . propranolol ER (INDERAL LA) 60 MG 24 hr capsule Take 60 mg by mouth every morning.     . silodosin (RAPAFLO) 8 MG CAPS capsule Take 8 mg by mouth daily with breakfast.     . sucralfate (CARAFATE) 1 G tablet Take 1 tablet (1 g total) by mouth 4 (four) times daily -  with meals and at bedtime. (Patient not taking: Reported on 12/16/2015) 10 tablet 0  . tenofovir (VIREAD) 300 MG tablet Take 1 tablet (300 mg total) by mouth daily. 30 tablet 6  . valACYclovir (VALTREX) 1000 MG tablet Take 1,000 mg by mouth daily as needed (for flares).   2  . zolpidem (AMBIEN) 10 MG tablet Take 1 tablet (10 mg total) by mouth at bedtime as needed for sleep. 30 tablet 0   No current facility-administered medications for this visit.     Neurologic: Headache: No Seizure: No Paresthesias:  No  Musculoskeletal: Strength & Muscle Tone: within normal limits Gait & Station: normal Patient leans: N/A  Psychiatric Specialty Exam: Review of Systems  Psychiatric/Behavioral: Positive for depression and suicidal ideas. Negative for hallucinations and substance abuse. The patient is nervous/anxious and has insomnia.   All other systems reviewed and are negative.   Blood pressure 124/72, pulse 64, height 5\' 11"  (1.803 m), weight 184 lb 6.4 oz (83.6 kg).Body mass index is 25.72 kg/m.  General Appearance: Fairly Groomed  Eye Contact:  Good  Speech:  Clear and Coherent  Volume:  Normal  Mood:  Depressed  Affect:  down  Thought Process:  Coherent and Goal Directed  Orientation:  Full (Time, Place, and Person)  Thought Content: Logical   Suicidal Thoughts:  Yes.  without intent/plan  Homicidal Thoughts:  No  Memory:  Immediate;   Good Recent;   Good Remote;   Good  Judgement:  Fair  Insight:  Fair  Psychomotor Activity:  Normal  Concentration:  Concentration: Good and Attention Span: Good  Recall:  Good  Fund of Knowledge: Good  Language: Good  Akathisia:  NA  Handed:  Right  AIMS (if indicated):  N/A  Assets:  Communication Skills Desire for Improvement  ADL's:  Intact  Cognition: WNL  Sleep:  poor   Assessment 65 yo M with  depression, alcohol use disorder in sustained remission, history of chronic hepatitis C, recently diagnosed Hep B,  streptococcal prosthetic joint infection, who presented for follow up appointment.   # MDD, recurrent, moderate without psychotic features Patient continues to endorse neurovegetative symptoms in the setting of marital discordance and upcoming legal issues with DWI. Will add mirtazapine to target his mood and insomnia. Will continue to discuss option of CBT when he is returned from jail.   #  Insomnia Discussed sleep hygiene. Will start mirtazapine as above, which will be beneficial for his insomnia. Discussed with patient again not  to continue Xanax given its potential risk of physical dependence; patient agreed with this plan. Continue Ambien at this time with plan to prescribe for a limited time.   # Alcohol use disorder, sustained remission Patient has been in sobriety for more than 2.5 years. Will continue to monitor and do motivational interview as needed.    Treatment Plan Summary: as below  1. Continue Lexapro 20 mg daily 2. Start mirtazapine 15 mg at night 3. Start hydroxyzine 25 mg three times a day as needed for anxiety 4. Continue Ambien 5-10 mg at night as needed for sleep  4. Return to clinic in one month  The patient demonstrates the following risk factors for suicide: Chronic risk factors for suicide include: psychiatric disorder of depression, substance use disorder and demographic factors (male, >53 y/o). Acute risk factors for suicide include: family or marital conflict, social withdrawal/isolation and loss (financial, interpersonal, professional). Protective factors for this patient include: coping skills and hope for the future. Considering these factors, the overall suicide risk at this point appears to be low. Patient is appropriate for outpatient follow up.   Norman Clay, MD 12/18/2015, 5:31 PM

## 2015-12-18 NOTE — Patient Instructions (Addendum)
1. Continue Lexapro 20 mg daily 2. Start mirtazapine 15 mg at night 3. Start hydroxyzine 25 mg three times a day as needed for anxiety 4. Continue Ambien 5-10 mg at night as needed for sleep  4. Return to clinic in one month

## 2015-12-23 ENCOUNTER — Other Ambulatory Visit: Payer: Self-pay | Admitting: Pharmacist Clinician (PhC)/ Clinical Pharmacy Specialist

## 2015-12-23 MED ORDER — SOFOSBUVIR-VELPATASVIR 400-100 MG PO TABS: 1.0000 | ORAL_TABLET | Freq: Every day | ORAL | 2 refills | Status: DC

## 2015-12-29 ENCOUNTER — Encounter (HOSPITAL_COMMUNITY): Payer: Self-pay | Admitting: *Deleted

## 2015-12-29 ENCOUNTER — Emergency Department (HOSPITAL_COMMUNITY): Payer: 59

## 2015-12-29 ENCOUNTER — Encounter (HOSPITAL_COMMUNITY)
Admission: EM | Disposition: A | Discharge status: Home Health | Payer: Self-pay | Source: Home / Self Care | Attending: Family Medicine

## 2015-12-29 ENCOUNTER — Observation Stay (HOSPITAL_COMMUNITY): Payer: 59 | Admitting: Certified Registered Nurse Anesthetist

## 2015-12-29 ENCOUNTER — Inpatient Hospital Stay (HOSPITAL_COMMUNITY)
Admission: EM | Admit: 2015-12-29 | Discharge: 2016-01-06 | DRG: 549 | Disposition: A | Discharge status: Home Health | Payer: 59 | Attending: Orthopedic Surgery | Admitting: Orthopedic Surgery

## 2015-12-29 DIAGNOSIS — Z8249 Family history of ischemic heart disease and other diseases of the circulatory system: Secondary | ICD-10-CM

## 2015-12-29 DIAGNOSIS — J90 Pleural effusion, not elsewhere classified: Secondary | ICD-10-CM | POA: Diagnosis not present

## 2015-12-29 DIAGNOSIS — B181 Chronic viral hepatitis B without delta-agent: Secondary | ICD-10-CM | POA: Diagnosis not present

## 2015-12-29 DIAGNOSIS — M545 Low back pain, unspecified: Secondary | ICD-10-CM

## 2015-12-29 DIAGNOSIS — F331 Major depressive disorder, recurrent, moderate: Secondary | ICD-10-CM | POA: Diagnosis present

## 2015-12-29 DIAGNOSIS — M00061 Staphylococcal arthritis, right knee: Principal | ICD-10-CM | POA: Diagnosis present

## 2015-12-29 DIAGNOSIS — F411 Generalized anxiety disorder: Secondary | ICD-10-CM | POA: Diagnosis present

## 2015-12-29 DIAGNOSIS — Z96653 Presence of artificial knee joint, bilateral: Secondary | ICD-10-CM | POA: Diagnosis present

## 2015-12-29 DIAGNOSIS — Z96659 Presence of unspecified artificial knee joint: Secondary | ICD-10-CM

## 2015-12-29 DIAGNOSIS — E872 Acidosis, unspecified: Secondary | ICD-10-CM

## 2015-12-29 DIAGNOSIS — A419 Sepsis, unspecified organism: Secondary | ICD-10-CM | POA: Diagnosis not present

## 2015-12-29 DIAGNOSIS — F17290 Nicotine dependence, other tobacco product, uncomplicated: Secondary | ICD-10-CM | POA: Diagnosis present

## 2015-12-29 DIAGNOSIS — T8459XS Infection and inflammatory reaction due to other internal joint prosthesis, sequela: Secondary | ICD-10-CM | POA: Diagnosis not present

## 2015-12-29 DIAGNOSIS — F40240 Claustrophobia: Secondary | ICD-10-CM | POA: Diagnosis present

## 2015-12-29 DIAGNOSIS — T8459XA Infection and inflammatory reaction due to other internal joint prosthesis, initial encounter: Secondary | ICD-10-CM

## 2015-12-29 DIAGNOSIS — G894 Chronic pain syndrome: Secondary | ICD-10-CM | POA: Diagnosis present

## 2015-12-29 DIAGNOSIS — Z79891 Long term (current) use of opiate analgesic: Secondary | ICD-10-CM

## 2015-12-29 DIAGNOSIS — R52 Pain, unspecified: Secondary | ICD-10-CM | POA: Diagnosis not present

## 2015-12-29 DIAGNOSIS — N400 Enlarged prostate without lower urinary tract symptoms: Secondary | ICD-10-CM | POA: Diagnosis present

## 2015-12-29 DIAGNOSIS — Z791 Long term (current) use of non-steroidal anti-inflammatories (NSAID): Secondary | ICD-10-CM

## 2015-12-29 DIAGNOSIS — M25561 Pain in right knee: Secondary | ICD-10-CM | POA: Diagnosis not present

## 2015-12-29 DIAGNOSIS — M069 Rheumatoid arthritis, unspecified: Secondary | ICD-10-CM | POA: Diagnosis present

## 2015-12-29 DIAGNOSIS — Z96652 Presence of left artificial knee joint: Secondary | ICD-10-CM | POA: Diagnosis present

## 2015-12-29 DIAGNOSIS — M009 Pyogenic arthritis, unspecified: Secondary | ICD-10-CM

## 2015-12-29 DIAGNOSIS — B999 Unspecified infectious disease: Secondary | ICD-10-CM | POA: Diagnosis present

## 2015-12-29 DIAGNOSIS — B182 Chronic viral hepatitis C: Secondary | ICD-10-CM | POA: Diagnosis present

## 2015-12-29 DIAGNOSIS — K5900 Constipation, unspecified: Secondary | ICD-10-CM | POA: Diagnosis present

## 2015-12-29 DIAGNOSIS — Z85820 Personal history of malignant melanoma of skin: Secondary | ICD-10-CM

## 2015-12-29 DIAGNOSIS — Z23 Encounter for immunization: Secondary | ICD-10-CM

## 2015-12-29 DIAGNOSIS — R262 Difficulty in walking, not elsewhere classified: Secondary | ICD-10-CM

## 2015-12-29 DIAGNOSIS — R1012 Left upper quadrant pain: Secondary | ICD-10-CM

## 2015-12-29 DIAGNOSIS — Z7982 Long term (current) use of aspirin: Secondary | ICD-10-CM

## 2015-12-29 DIAGNOSIS — I10 Essential (primary) hypertension: Secondary | ICD-10-CM | POA: Diagnosis present

## 2015-12-29 DIAGNOSIS — K219 Gastro-esophageal reflux disease without esophagitis: Secondary | ICD-10-CM | POA: Diagnosis present

## 2015-12-29 DIAGNOSIS — G4730 Sleep apnea, unspecified: Secondary | ICD-10-CM | POA: Diagnosis present

## 2015-12-29 DIAGNOSIS — B9561 Methicillin susceptible Staphylococcus aureus infection as the cause of diseases classified elsewhere: Secondary | ICD-10-CM | POA: Diagnosis present

## 2015-12-29 DIAGNOSIS — E039 Hypothyroidism, unspecified: Secondary | ICD-10-CM | POA: Diagnosis present

## 2015-12-29 DIAGNOSIS — I959 Hypotension, unspecified: Secondary | ICD-10-CM | POA: Diagnosis present

## 2015-12-29 DIAGNOSIS — Z79899 Other long term (current) drug therapy: Secondary | ICD-10-CM

## 2015-12-29 HISTORY — PX: KNEE ARTHROSCOPY: SHX127

## 2015-12-29 LAB — CBC WITH DIFFERENTIAL/PLATELET
BASOS PCT: 0 %
Basophils Absolute: 0.1 10*3/uL (ref 0.0–0.1)
Eosinophils Absolute: 0.4 10*3/uL (ref 0.0–0.7)
Eosinophils Relative: 3 %
HEMATOCRIT: 41 % (ref 39.0–52.0)
Hemoglobin: 14 g/dL (ref 13.0–17.0)
LYMPHS ABS: 2.3 10*3/uL (ref 0.7–4.0)
LYMPHS PCT: 15 %
MCH: 33.4 pg (ref 26.0–34.0)
MCHC: 34.1 g/dL (ref 30.0–36.0)
MCV: 97.9 fL (ref 78.0–100.0)
MONO ABS: 2.2 10*3/uL — AB (ref 0.1–1.0)
MONOS PCT: 15 %
NEUTROS ABS: 10 10*3/uL — AB (ref 1.7–7.7)
Neutrophils Relative %: 67 %
Platelets: 230 10*3/uL (ref 150–400)
RBC: 4.19 MIL/uL — ABNORMAL LOW (ref 4.22–5.81)
RDW: 16.7 % — AB (ref 11.5–15.5)
WBC: 15 10*3/uL — ABNORMAL HIGH (ref 4.0–10.5)

## 2015-12-29 LAB — COMPREHENSIVE METABOLIC PANEL
ALK PHOS: 130 U/L — AB (ref 38–126)
ALT: 31 U/L (ref 17–63)
ANION GAP: 8 (ref 5–15)
AST: 51 U/L — ABNORMAL HIGH (ref 15–41)
Albumin: 2.7 g/dL — ABNORMAL LOW (ref 3.5–5.0)
BILIRUBIN TOTAL: 1.8 mg/dL — AB (ref 0.3–1.2)
BUN: 10 mg/dL (ref 6–20)
CALCIUM: 8.4 mg/dL — AB (ref 8.9–10.3)
CO2: 23 mmol/L (ref 22–32)
Chloride: 103 mmol/L (ref 101–111)
Creatinine, Ser: 1.51 mg/dL — ABNORMAL HIGH (ref 0.61–1.24)
GFR, EST AFRICAN AMERICAN: 55 mL/min — AB (ref >60)
GFR, EST NON AFRICAN AMERICAN: 47 mL/min — AB (ref >60)
Glucose, Bld: 104 mg/dL — ABNORMAL HIGH (ref 65–99)
POTASSIUM: 4.3 mmol/L (ref 3.5–5.1)
Sodium: 134 mmol/L — ABNORMAL LOW (ref 135–145)
TOTAL PROTEIN: 6.4 g/dL — AB (ref 6.5–8.1)

## 2015-12-29 LAB — SEDIMENTATION RATE: SED RATE: 6 mm/h (ref 0–16)

## 2015-12-29 LAB — MRSA PCR SCREENING: MRSA by PCR: NEGATIVE

## 2015-12-29 LAB — TSH: TSH: 0.206 u[IU]/mL — AB (ref 0.350–4.500)

## 2015-12-29 LAB — URINALYSIS, ROUTINE W REFLEX MICROSCOPIC
Bilirubin Urine: NEGATIVE
GLUCOSE, UA: NEGATIVE mg/dL
HGB URINE DIPSTICK: NEGATIVE
KETONES UR: NEGATIVE mg/dL
Leukocytes, UA: NEGATIVE
Nitrite: NEGATIVE
PH: 6 (ref 5.0–8.0)
PROTEIN: 30 mg/dL — AB
Specific Gravity, Urine: 1.011 (ref 1.005–1.030)

## 2015-12-29 LAB — URINE MICROSCOPIC-ADD ON
Bacteria, UA: NONE SEEN
RBC / HPF: NONE SEEN RBC/hpf (ref 0–5)

## 2015-12-29 LAB — SYNOVIAL CELL COUNT + DIFF, W/ CRYSTALS
Crystals, Fluid: NONE SEEN
EOSINOPHILS-SYNOVIAL: 0 % (ref 0–1)
Lymphocytes-Synovial Fld: 0 % (ref 0–20)
Monocyte-Macrophage-Synovial Fluid: 2 % — ABNORMAL LOW (ref 50–90)
NEUTROPHIL, SYNOVIAL: 98 % — AB (ref 0–25)
WBC, SYNOVIAL: 40375 /mm3 — AB (ref 0–200)

## 2015-12-29 LAB — I-STAT CG4 LACTIC ACID, ED: Lactic Acid, Venous: 1.5 mmol/L (ref 0.5–1.9)

## 2015-12-29 LAB — CG4 I-STAT (LACTIC ACID): Lactic Acid, Venous: 4.57 mmol/L (ref 0.5–1.9)

## 2015-12-29 LAB — LACTIC ACID, PLASMA
LACTIC ACID, VENOUS: 0.8 mmol/L (ref 0.5–1.9)
LACTIC ACID, VENOUS: 0.9 mmol/L (ref 0.5–1.9)

## 2015-12-29 LAB — ETHANOL

## 2015-12-29 LAB — PROTIME-INR
INR: 1.27
PROTHROMBIN TIME: 16 s — AB (ref 11.4–15.2)

## 2015-12-29 LAB — C-REACTIVE PROTEIN: CRP: 8.2 mg/dL — AB (ref <1.0)

## 2015-12-29 SURGERY — ARTHROSCOPY, KNEE
Anesthesia: General | Site: Knee | Laterality: Right

## 2015-12-29 MED ORDER — SODIUM CHLORIDE 0.9 % IV BOLUS (SEPSIS)
2000.0000 mL | Freq: Once | INTRAVENOUS | Status: AC
  Administered 2015-12-29: 1000 mL via INTRAVENOUS

## 2015-12-29 MED ORDER — OXYCODONE HCL 5 MG PO TABS
5.0000 mg | ORAL_TABLET | ORAL | Status: DC | PRN
  Administered 2015-12-29 – 2015-12-30 (×2): 5 mg via ORAL
  Administered 2015-12-30: 10 mg via ORAL
  Administered 2015-12-30: 5 mg via ORAL
  Administered 2015-12-31 – 2016-01-03 (×14): 10 mg via ORAL
  Filled 2015-12-29: qty 1
  Filled 2015-12-29 (×19): qty 2

## 2015-12-29 MED ORDER — MORPHINE SULFATE (PF) 2 MG/ML IV SOLN
2.0000 mg | INTRAVENOUS | Status: DC | PRN
  Administered 2015-12-29 – 2016-01-02 (×17): 2 mg via INTRAVENOUS
  Filled 2015-12-29 (×17): qty 1

## 2015-12-29 MED ORDER — MEPERIDINE HCL 25 MG/ML IJ SOLN: 6.2500 mg | INTRAMUSCULAR | Status: DC | PRN

## 2015-12-29 MED ORDER — PROPOFOL 10 MG/ML IV BOLUS
INTRAVENOUS | Status: AC
  Filled 2015-12-29: qty 20

## 2015-12-29 MED ORDER — FENTANYL CITRATE (PF) 100 MCG/2ML IJ SOLN
INTRAMUSCULAR | Status: AC
  Filled 2015-12-29: qty 4

## 2015-12-29 MED ORDER — LEVOTHYROXINE SODIUM 100 MCG PO TABS
200.0000 ug | ORAL_TABLET | Freq: Every day | ORAL | Status: DC
  Administered 2015-12-30 – 2016-01-06 (×8): 200 ug via ORAL
  Filled 2015-12-29 (×9): qty 2

## 2015-12-29 MED ORDER — TETANUS-DIPHTH-ACELL PERTUSSIS 5-2.5-18.5 LF-MCG/0.5 IM SUSP
0.5000 mL | Freq: Once | INTRAMUSCULAR | Status: AC
  Administered 2015-12-29: 0.5 mL via INTRAMUSCULAR
  Filled 2015-12-29: qty 0.5

## 2015-12-29 MED ORDER — HYDROMORPHONE HCL 1 MG/ML IJ SOLN: 0.5000 mg | Freq: Once | INTRAMUSCULAR | Status: DC

## 2015-12-29 MED ORDER — ESCITALOPRAM OXALATE 10 MG PO TABS
20.0000 mg | ORAL_TABLET | Freq: Every day | ORAL | Status: DC
  Administered 2015-12-30 – 2016-01-05 (×7): 20 mg via ORAL
  Filled 2015-12-29 (×7): qty 2

## 2015-12-29 MED ORDER — MIDAZOLAM HCL 5 MG/5ML IJ SOLN
INTRAMUSCULAR | Status: DC | PRN
  Administered 2015-12-29: 1 mg via INTRAVENOUS

## 2015-12-29 MED ORDER — FENTANYL CITRATE (PF) 100 MCG/2ML IJ SOLN
INTRAMUSCULAR | Status: DC | PRN
Start: 2015-12-29 — End: 2015-12-29
  Administered 2015-12-29: 50 ug via INTRAVENOUS
  Administered 2015-12-29: 100 ug via INTRAVENOUS
  Administered 2015-12-29: 50 ug via INTRAVENOUS

## 2015-12-29 MED ORDER — EPHEDRINE 5 MG/ML INJ
INTRAVENOUS | Status: AC
  Filled 2015-12-29: qty 10

## 2015-12-29 MED ORDER — SODIUM CHLORIDE 0.9 % IV SOLN
INTRAVENOUS | Status: DC
  Administered 2015-12-29 – 2016-01-04 (×6): via INTRAVENOUS

## 2015-12-29 MED ORDER — ACETAMINOPHEN 650 MG RE SUPP: 650.0000 mg | Freq: Four times a day (QID) | RECTAL | Status: DC | PRN

## 2015-12-29 MED ORDER — TAMSULOSIN HCL 0.4 MG PO CAPS
0.4000 mg | ORAL_CAPSULE | Freq: Every day | ORAL | Status: DC
  Administered 2015-12-29 – 2016-01-05 (×8): 0.4 mg via ORAL
  Filled 2015-12-29 (×8): qty 1

## 2015-12-29 MED ORDER — PROMETHAZINE HCL 25 MG/ML IJ SOLN: 6.2500 mg | INTRAMUSCULAR | Status: DC | PRN

## 2015-12-29 MED ORDER — METHOCARBAMOL 1000 MG/10ML IJ SOLN
500.0000 mg | Freq: Four times a day (QID) | INTRAVENOUS | Status: DC | PRN
  Filled 2015-12-29: qty 5

## 2015-12-29 MED ORDER — ONDANSETRON HCL 4 MG PO TABS
4.0000 mg | ORAL_TABLET | Freq: Four times a day (QID) | ORAL | Status: DC | PRN
  Administered 2016-01-02: 4 mg via ORAL
  Filled 2015-12-29: qty 1

## 2015-12-29 MED ORDER — BSS IO SOLN
INTRAOCULAR | Status: AC
  Filled 2015-12-29: qty 15

## 2015-12-29 MED ORDER — HYDROMORPHONE HCL 1 MG/ML IJ SOLN: 0.2500 mg | INTRAMUSCULAR | Status: DC | PRN

## 2015-12-29 MED ORDER — ONDANSETRON HCL 4 MG/2ML IJ SOLN
INTRAMUSCULAR | Status: AC
  Filled 2015-12-29: qty 2

## 2015-12-29 MED ORDER — ZOLPIDEM TARTRATE 5 MG PO TABS
5.0000 mg | ORAL_TABLET | Freq: Every evening | ORAL | Status: DC | PRN
  Administered 2015-12-30 – 2016-01-05 (×7): 5 mg via ORAL
  Filled 2015-12-29 (×7): qty 1

## 2015-12-29 MED ORDER — BUPIVACAINE HCL (PF) 0.25 % IJ SOLN
INTRAMUSCULAR | Status: AC
  Filled 2015-12-29: qty 30

## 2015-12-29 MED ORDER — ONDANSETRON HCL 4 MG/2ML IJ SOLN
INTRAMUSCULAR | Status: DC | PRN
  Administered 2015-12-29: 4 mg via INTRAVENOUS

## 2015-12-29 MED ORDER — PHENYLEPHRINE HCL 10 MG/ML IJ SOLN
INTRAMUSCULAR | Status: DC | PRN
  Administered 2015-12-29: 80 ug via INTRAVENOUS

## 2015-12-29 MED ORDER — METHOCARBAMOL 500 MG PO TABS
500.0000 mg | ORAL_TABLET | Freq: Four times a day (QID) | ORAL | Status: DC | PRN
  Administered 2015-12-31 – 2016-01-03 (×4): 500 mg via ORAL
  Filled 2015-12-29 (×3): qty 1

## 2015-12-29 MED ORDER — PROPOFOL 10 MG/ML IV BOLUS
INTRAVENOUS | Status: DC | PRN
  Administered 2015-12-29 (×2): 50 mg via INTRAVENOUS
  Administered 2015-12-29: 150 mg via INTRAVENOUS

## 2015-12-29 MED ORDER — METOCLOPRAMIDE HCL 5 MG PO TABS: 5.0000 mg | ORAL_TABLET | Freq: Three times a day (TID) | ORAL | Status: DC | PRN

## 2015-12-29 MED ORDER — MIRTAZAPINE 15 MG PO TABS
15.0000 mg | ORAL_TABLET | Freq: Every day | ORAL | Status: DC
  Administered 2015-12-30 – 2016-01-05 (×7): 15 mg via ORAL
  Filled 2015-12-29 (×7): qty 1

## 2015-12-29 MED ORDER — VANCOMYCIN HCL IN DEXTROSE 750-5 MG/150ML-% IV SOLN
750.0000 mg | Freq: Two times a day (BID) | INTRAVENOUS | Status: DC
  Filled 2015-12-29: qty 150

## 2015-12-29 MED ORDER — SOFOSBUVIR-VELPATASVIR 400-100 MG PO TABS
1.0000 | ORAL_TABLET | Freq: Every day | ORAL | Status: DC
  Administered 2015-12-30 – 2016-01-01 (×3): 1 via ORAL
  Administered 2016-01-02: 17:00:00 via ORAL
  Administered 2016-01-03 – 2016-01-06 (×4): 1 via ORAL
  Filled 2015-12-29 (×10): qty 1

## 2015-12-29 MED ORDER — SODIUM CHLORIDE 0.9 % IR SOLN
Status: DC | PRN
  Administered 2015-12-29: 6000 mL

## 2015-12-29 MED ORDER — HYDROMORPHONE HCL 2 MG/ML IJ SOLN: 0.5000 mg | Freq: Once | INTRAMUSCULAR | Status: DC

## 2015-12-29 MED ORDER — SODIUM CHLORIDE 0.9 % IV BOLUS (SEPSIS)
1000.0000 mL | Freq: Once | INTRAVENOUS | Status: AC
  Administered 2015-12-29: 1000 mL via INTRAVENOUS

## 2015-12-29 MED ORDER — LACTATED RINGERS IV SOLN
INTRAVENOUS | Status: DC | PRN
Start: 2015-12-29 — End: 2015-12-29
  Administered 2015-12-29: 14:00:00 via INTRAVENOUS

## 2015-12-29 MED ORDER — SUCCINYLCHOLINE CHLORIDE 200 MG/10ML IV SOSY
PREFILLED_SYRINGE | INTRAVENOUS | Status: DC | PRN
  Administered 2015-12-29: 80 mg via INTRAVENOUS

## 2015-12-29 MED ORDER — HYDROMORPHONE HCL 2 MG/ML IJ SOLN
1.0000 mg | Freq: Once | INTRAMUSCULAR | Status: AC
  Administered 2015-12-29: 1 mg via INTRAVENOUS
  Filled 2015-12-29: qty 1

## 2015-12-29 MED ORDER — METOCLOPRAMIDE HCL 5 MG/ML IJ SOLN
5.0000 mg | Freq: Three times a day (TID) | INTRAMUSCULAR | Status: DC | PRN
  Administered 2015-12-31: 10 mg via INTRAVENOUS
  Filled 2015-12-29: qty 2

## 2015-12-29 MED ORDER — DEXTROSE 5 % IV SOLN
INTRAVENOUS | Status: DC | PRN
  Administered 2015-12-29: 30 ug/min via INTRAVENOUS

## 2015-12-29 MED ORDER — DEXTROSE 5 % IV SOLN
1.0000 g | Freq: Three times a day (TID) | INTRAVENOUS | Status: DC
  Administered 2015-12-29: 1 g via INTRAVENOUS
  Filled 2015-12-29 (×3): qty 1

## 2015-12-29 MED ORDER — TENOFOVIR DISOPROXIL FUMARATE 300 MG PO TABS
300.0000 mg | ORAL_TABLET | Freq: Every day | ORAL | Status: DC
  Administered 2015-12-30 – 2016-01-06 (×8): 300 mg via ORAL
  Filled 2015-12-29 (×8): qty 1

## 2015-12-29 MED ORDER — VANCOMYCIN HCL IN DEXTROSE 1-5 GM/200ML-% IV SOLN
1000.0000 mg | Freq: Once | INTRAVENOUS | Status: AC
  Administered 2015-12-29: 1000 mg via INTRAVENOUS
  Filled 2015-12-29: qty 200

## 2015-12-29 MED ORDER — MIDAZOLAM HCL 2 MG/2ML IJ SOLN
INTRAMUSCULAR | Status: AC
  Filled 2015-12-29: qty 2

## 2015-12-29 MED ORDER — VANCOMYCIN HCL IN DEXTROSE 750-5 MG/150ML-% IV SOLN: 750.0000 mg | Freq: Two times a day (BID) | INTRAVENOUS | Status: DC

## 2015-12-29 MED ORDER — HYDROMORPHONE HCL 2 MG/ML IJ SOLN
0.5000 mg | Freq: Once | INTRAMUSCULAR | Status: AC
  Administered 2015-12-29: 0.5 mg via INTRAVENOUS
  Filled 2015-12-29: qty 1

## 2015-12-29 MED ORDER — VANCOMYCIN HCL IN DEXTROSE 750-5 MG/150ML-% IV SOLN
750.0000 mg | Freq: Two times a day (BID) | INTRAVENOUS | Status: DC
  Administered 2015-12-30 – 2015-12-31 (×3): 750 mg via INTRAVENOUS
  Filled 2015-12-29 (×5): qty 150

## 2015-12-29 MED ORDER — LIDOCAINE 2% (20 MG/ML) 5 ML SYRINGE
INTRAMUSCULAR | Status: DC | PRN
  Administered 2015-12-29: 100 mg via INTRAVENOUS

## 2015-12-29 MED ORDER — EPHEDRINE SULFATE 50 MG/ML IJ SOLN
INTRAMUSCULAR | Status: DC | PRN
  Administered 2015-12-29: 10 mg via INTRAVENOUS

## 2015-12-29 MED ORDER — LIDOCAINE HCL (PF) 1 % IJ SOLN
5.0000 mL | Freq: Once | INTRAMUSCULAR | Status: AC
  Administered 2015-12-29: 5 mL via INTRADERMAL
  Filled 2015-12-29: qty 5

## 2015-12-29 MED ORDER — LACTATED RINGERS IV SOLN: INTRAVENOUS | Status: DC

## 2015-12-29 MED ORDER — SODIUM CHLORIDE 0.9 % IV BOLUS (SEPSIS)
1000.0000 mL | Freq: Once | INTRAVENOUS | Status: AC
Start: 2015-12-29 — End: 2015-12-29
  Administered 2015-12-29: 1000 mL via INTRAVENOUS

## 2015-12-29 MED ORDER — ACETAMINOPHEN 325 MG PO TABS: 650.0000 mg | ORAL_TABLET | Freq: Four times a day (QID) | ORAL | Status: DC | PRN

## 2015-12-29 MED ORDER — ONDANSETRON HCL 4 MG/2ML IJ SOLN
4.0000 mg | Freq: Four times a day (QID) | INTRAMUSCULAR | Status: DC | PRN
  Administered 2016-01-02: 4 mg via INTRAVENOUS

## 2015-12-29 SURGICAL SUPPLY — 52 items
BANDAGE ACE 6X5 VEL STRL LF (GAUZE/BANDAGES/DRESSINGS) ×3 IMPLANT
BANDAGE ELASTIC 6 VELCRO ST LF (GAUZE/BANDAGES/DRESSINGS) ×3 IMPLANT
BLADE CUDA 5.5 (BLADE) IMPLANT
BLADE GREAT WHITE 4.2 (BLADE) IMPLANT
BLADE GREAT WHITE 4.2MM (BLADE)
BNDG COHESIVE 6X5 TAN STRL LF (GAUZE/BANDAGES/DRESSINGS) IMPLANT
BNDG GAUZE ELAST 4 BULKY (GAUZE/BANDAGES/DRESSINGS) ×3 IMPLANT
BUR OVAL 6.0 (BURR) IMPLANT
COVER SURGICAL LIGHT HANDLE (MISCELLANEOUS) ×3 IMPLANT
CUFF TOURNIQUET SINGLE 34IN LL (TOURNIQUET CUFF) ×3 IMPLANT
CUFF TOURNIQUET SINGLE 44IN (TOURNIQUET CUFF) IMPLANT
DRAPE ARTHROSCOPY W/POUCH 114 (DRAPES) ×3 IMPLANT
DRAPE U-SHAPE 47X51 STRL (DRAPES) ×3 IMPLANT
DRSG EMULSION OIL 3X3 NADH (GAUZE/BANDAGES/DRESSINGS) IMPLANT
DRSG MEPILEX BORDER 4X8 (GAUZE/BANDAGES/DRESSINGS) IMPLANT
DRSG PAD ABDOMINAL 8X10 ST (GAUZE/BANDAGES/DRESSINGS) ×3 IMPLANT
DURAPREP 26ML APPLICATOR (WOUND CARE) ×3 IMPLANT
ELECT PENCIL ROCKER SW 15FT (MISCELLANEOUS) ×3 IMPLANT
EVACUATOR 1/8 PVC DRAIN (DRAIN) ×3 IMPLANT
GAUZE SPONGE 4X4 12PLY STRL (GAUZE/BANDAGES/DRESSINGS) ×3 IMPLANT
GAUZE XEROFORM 1X8 LF (GAUZE/BANDAGES/DRESSINGS) ×3 IMPLANT
GLOVE BIO SURGEON STRL SZ 6.5 (GLOVE) ×2 IMPLANT
GLOVE BIO SURGEONS STRL SZ 6.5 (GLOVE) ×1
GLOVE BIOGEL PI IND STRL 6.5 (GLOVE) ×1 IMPLANT
GLOVE BIOGEL PI IND STRL 8.5 (GLOVE) ×1 IMPLANT
GLOVE BIOGEL PI INDICATOR 6.5 (GLOVE) ×2
GLOVE BIOGEL PI INDICATOR 8.5 (GLOVE) ×2
GLOVE SS BIOGEL STRL SZ 8.5 (GLOVE) ×1 IMPLANT
GLOVE SUPERSENSE BIOGEL SZ 8.5 (GLOVE) ×2
GOWN STRL REUS W/ TWL LRG LVL3 (GOWN DISPOSABLE) ×2 IMPLANT
GOWN STRL REUS W/TWL 2XL LVL3 (GOWN DISPOSABLE) ×3 IMPLANT
GOWN STRL REUS W/TWL LRG LVL3 (GOWN DISPOSABLE) ×4
KIT BASIN OR (CUSTOM PROCEDURE TRAY) ×3 IMPLANT
KIT ROOM TURNOVER OR (KITS) ×3 IMPLANT
MANIFOLD NEPTUNE II (INSTRUMENTS) ×3 IMPLANT
NEEDLE 18GX1X1/2 (RX/OR ONLY) (NEEDLE) IMPLANT
PACK ARTHROSCOPY DSU (CUSTOM PROCEDURE TRAY) ×3 IMPLANT
PAD ARMBOARD 7.5X6 YLW CONV (MISCELLANEOUS) ×6 IMPLANT
PADDING CAST COTTON 6X4 STRL (CAST SUPPLIES) IMPLANT
SET ARTHROSCOPY TUBING (MISCELLANEOUS) ×2
SET ARTHROSCOPY TUBING LN (MISCELLANEOUS) ×1 IMPLANT
SURGIFLO W/THROMBIN 8M KIT (HEMOSTASIS) IMPLANT
SUT BONE WAX W31G (SUTURE) IMPLANT
SUT ETHILON 4 0 PS 2 18 (SUTURE) ×3 IMPLANT
SUT MNCRL AB 3-0 PS2 18 (SUTURE) ×3 IMPLANT
SUT VIC AB 2-0 CT1 18 (SUTURE) IMPLANT
SYR 20CC LL (SYRINGE) ×3 IMPLANT
TOWEL OR 17X24 6PK STRL BLUE (TOWEL DISPOSABLE) ×6 IMPLANT
TUBE CONNECTING 12'X1/4 (SUCTIONS) ×1
TUBE CONNECTING 12X1/4 (SUCTIONS) ×2 IMPLANT
WAND HAND CNTRL MULTIVAC 90 (MISCELLANEOUS) IMPLANT
WATER STERILE IRR 1000ML POUR (IV SOLUTION) ×3 IMPLANT

## 2015-12-29 NOTE — H&P (Signed)
[facility] Admission History and Physical Service Pager: [phone]  Patient name: Juan Wiggins Medical record number: [id number] Date of birth: 11/26/1950 Age: 65 y.o. Gender: male  Primary Care Provider: Helane Rima, MD Consultants: Orthopedics Code Status: FULL  Chief Complaint: Right knee pain  Assessment and Plan: DEQUANTE TREMAINE is a 65 y.o. male presenting with right knee pain found to have a septic joint . PMH is significant for hypothyroidism, alcohol abuse, Hepatitis B, Hepatitis C,  HTN, GERD, chronic pain syndrome, hx of TKA (both in 2015), hx of R septic knee (06/2015), major depression, anxiety, BPH.    #Sepsis 2/2 to right knee joint s/p arthroscopy with irrigation and debridement 11/5 Presents to ED meeting sepsis criteria with painful swelling and warmth of right knee. Vitals notable for hypotension to 94/54. Received 3L NS with some improvement in hypotension.  Afebrile with elevated white count of 15.  Lactic acid normal at 1.50, elevated to 4.57 after surgery, now normalized 0.8. ESR normal at 6. CRP elevated to 8.2.  Code sepsis was called based on SIRS criteria, source of infection being infected right knee.  Symptoms had been worsening over the course of last 4 days.  No relief with Percoset at home.  Has had similar symptoms earlier this year (May 2017) at which time he had a septic joint +for Strep Group C and requiring a washout. Synovial right knee fluid +with abundant wbcs, predominantly PMNs, rare G+ cocci in clusters. UA with protein 30, neg for nitrite, neg for LE.  Urine microscopy with 0-5 squams but otherwise normal.  No trauma to the affected joint. In ED received Dilaudid and was started on Vancomycin and Cefepime. Tdap injection administered.  Orthopedic surgery consulted and was taken to the OR for arthrocentesis of the joint.  Procedure went well without complications.   -Admit to SDU, attending Dr. Nori Riis - Continue Vancomycin 750  mg IV Q12 h  - Orthopedic recs  - c/s Infectious Disease, appreciate recs --> discontinue Cefepime, per ID (Dr. Baxter Flattery). To see patient formally 11/6  -Synovial cell count + differentials with crystals, pending -Bcx  -Ucx  -Ethanol level -UDS -Trend LA -Morphine 2 mg IV Q2h prn for breakthrough pain -Oxycodone IR 5-'10mg'$  Q3 prn for breakthrough pain -Continue on 2L O2 per nasal cannula (placed after surgery, on room air in ED), wean as tolerated.  -continuous pulse ox -Zofran 4 mg Q6 prn -bed rest -Vitals per unit routine  #Lactic acidosis, resolved.  Initially with LA 1.50 in the ED.  Elevated to 4.57 after surgery. Gave 750cc L NS Bolus Possibly was hypotensive during surgery and on lactated ringers.  Normalized to 0.8 - IV NS @ 130 cc/hr until eating  -Trend LA  #Hypertension.  Hypotensive on admission with BP of 94/54.  Improved s/p fluid boluses in ED.  Home medications include propranolol 60 mg daily -Hold home antihypertensive as he is hypotensive -consider resuming once stable  #Hepatitis B, chronic.  At home on Viread 300 mg daily.  PT elevated 16.  INR normal 1.27.  -Continue home Viread 300 mg daily -Monitor  #Hepatitis C, chronic.  At home on Sofosbuvir-Velpatasvir [name]).  -Continue home Epclusa 400-100 mg po tab daily -Monitor  #Hypothyroidism.  Stable.  At home on Levothyroxine.  Last TSH 13.8 (07/19/2015).  -Continue home Synthroid 200 mcg daily -repeat TSH   #difficulty sleeping . At home on Ambien 10 mg at bedtime.  - Ambien '5mg'$  PRN  #Depression . Stable.  At home on Lexapro and Remeron. Also on Ambien '10mg'$  PRN  -Continue Lexapro 20 mg daily at bedtime - Remeron  - Ambien 5 mg PRN   #BPH:  - continue home Rapaflo   FEN/GI: NPO for surgery > heart healthy, IV NS '@130'$  cc/hr Prophylaxis: SCDs, will have to ask surgery regarding prophylaxis    Disposition: Admit to SDU for close monitoring, Dr. Nori Riis.   History of Present Illness:  Juan Wiggins is a  65 y.o. male presenting with right knee pain and left low back pain over the past couple of days.  Denies falls and recent trauma.  States he is able to bear weight and walk on it but "very gingerly."  Has had to have his knee washed out before when he had +strep and feels as though it is the same type of pain this time as well which is what prompted him to come in .  Denies fevers and chills at home.  Denies sore throat.  Reports some shortness of breath on exertion for the past 2 weeks.  Does not have a home O2 requirement.  Does not have this shortness of breath at rest.  Reports he has had swelling of the left leg for the past 3 months which has been stable, not worsening.  Denies orthopnea, abdominal pain, increased frequency/urgency of urination.  Has not noticed LE weakness.  Does endorse numbness and tingling of lower extremities which has been chronic. Has been having some intermittent nausea and vomiting over the past few months.  Last emesis was a few weeks ago.  Is not nauseous at current time.  Last BM was this AM.  Reports alternating constipation and diarrhea.   He now smokes only cigars but has been a former smoker.  Used to smoke  1-1.5 ppd x 35 years.  Admits to using both marijuana and cocaine 40 years ago but not recently.  Does not currently drink, has not drank in over 2.5 years.   Review Of Systems: Per HPI with the following additions:  Review of Systems  Constitutional: Negative for chills and fever.  HENT: Negative for congestion and sore throat.   Respiratory: Positive for shortness of breath. Negative for cough, hemoptysis, sputum production and wheezing.   Cardiovascular: Positive for leg swelling. Negative for chest pain, orthopnea and claudication.  Gastrointestinal: Positive for nausea. Negative for abdominal pain and vomiting.  Musculoskeletal: Positive for back pain and joint pain. Negative for falls.  Skin: Negative for itching and rash.  Neurological: Positive for  tingling. Negative for dizziness and weakness.  Endo/Heme/Allergies: Does not bruise/bleed easily.  Psychiatric/Behavioral: Negative.    Patient Active Problem List   Diagnosis Date Noted  . Sepsis ([place]) 12/29/2015  . Septic arthritis of knee, right ([place]) 07/18/2015  . Bacteremia due to Streptococcus 07/18/2015  . Cellulitis of right upper extremity 07/18/2015  . Hepatitis C 07/18/2015  . Anemia 07/18/2015  . Opacity of lung on imaging study, bilateral 07/18/2015  . Anxiety state 07/18/2015  . History of total knee arthroplasty 07/16/2015  . Severe sepsis ([place]) 07/14/2015  . Moderate episode of recurrent major depressive disorder ([place])   . Chronic pain syndrome 05/21/2014  . Essential hypertension, benign 08/28/2013  . GERD (gastroesophageal reflux disease) 08/28/2013  . Osteoarthritis of left knee 08/23/2013  . Total knee replacement status 08/23/2013  . Alcohol dependence ([place]) 07/14/2013  . Alcohol dependency ([place]) 07/07/2013  . Major depression 07/07/2013  . Varicose veins of lower extremities with other complications  09/15/2012  . Recurrent falls 04/14/2011  . Hypotension 04/13/2011  . ARF (acute renal failure) ([place]) 04/13/2011  . Hypothyroidism 04/13/2011  . Prolapsed and thrombosed 09/03/2010   Past Medical History: Past Medical History:  Diagnosis Date  . Anxiety   . Arthritis    RHEUMATOID ARTHRITIS; OA LEFT KNEE  . Cancer (HCC)    MELANOMA REMOVED RT SHOULDER  . Depression   . GERD (gastroesophageal reflux disease)    PREVACID IF NEEDED  . Heart murmur   . Hemorrhoids   . Hepatitis A    PT STATES TYPE OF HEPATITIS YOU GET FROM SHELLFISH  . Hepatitis B   . Hepatitis C   . Hypertension   . Hypothyroidism   . 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 Pancreatitis, alcoholic, acute   . Sleep apnea    CLAUSTROPHOBIC - COULD NOT TOLERATE CPAP MASK  . Tachycardia    PT STATES HIS HEART RATE USUALLY 100 OR MORE  . Thyroid  disease    Past Surgical History: Past Surgical History:  Procedure Laterality Date  . colonscopy     . ELBOW SURGERY     BILATERAL ELBOW SURGERY  . I&D KNEE WITH POLY EXCHANGE Right 07/16/2015   Procedure: IRRIGATION AND DEBRIDEMENT RIGHT KNEE WITH POLY EXCHANGE AND PLACEMENT OF ANTIBIOTIC BEADS;  Surgeon: Latanya Maudlin, MD;  Location: [facility];  Service: Orthopedics;  Laterality: Right;  . KNEE ARTHROSCOPY Right 07/14/2015   Procedure: ARTHROSCOPY IRRIGATION AND DEBRIDEMENT - KNEE;  Surgeon: Justice Britain, MD;  Location: [facility];  Service: Orthopedics;  Laterality: Right;  . KNEE SURGERY     BILATERAL KNEE ARTHROSCOPY  . left TKR     July 2015  . LUMBAR LAMINECTOMY/DECOMPRESSION MICRODISCECTOMY Left 06/06/2014   Procedure: LEFT L4-5 DECOMPRESSION ;  Surgeon: Melina Schools, MD;  Location: [facility];  Service: Orthopedics;  Laterality: Left;  . REFRACTIVE SURGERY    . SHOULDER SURGERY     RIGHT ROTATOR CUFF REPAIR AND LEFT ARTHROSCOPY  . TOTAL KNEE ARTHROPLASTY Left 08/23/2013   Procedure: LEFT TOTAL KNEE ARTHROPLASTY;  Surgeon: Tobi Bastos, MD;  Location: [facility];  Service: Orthopedics;  Laterality: Left;  . TOTAL KNEE ARTHROPLASTY Right 02/07/2014   Procedure: RIGHT TOTAL KNEE ARTHROPLASTY;  Surgeon: Tobi Bastos, MD;  Location: [facility];  Service: Orthopedics;  Laterality: Right;   Social History: Social History  Substance Use Topics  . Smoking status: Former Smoker    Packs/day: 1.50    Years: 35.00    Types: Cigars    Quit date: 02/22/1998  . Smokeless tobacco: Never Used     Comment: used cigarettes 20 years ago  . Alcohol use No     Comment: " last alcohol use was in December 2015."   Family History: Family History  Problem Relation Age of Onset  . Hypertension Mother   . Other Mother     varicose veins  . Hypertension Sister    Allergies and Medications: Allergies  Allergen Reactions  . Heparin Other (See Comments)    Low platelets (130s), no HIT testing performed,  platelets recovered.  . Wellbutrin [Bupropion] Other (See Comments)    Hallucinations-started on '150mg'$  daily dosage   No current facility-administered medications on file prior to encounter.    Current Outpatient Prescriptions on File Prior to Encounter  Medication Sig Dispense Refill  . acetaminophen (TYLENOL) 325 MG tablet Take 650 mg by mouth every 6 (  six) hours as needed for headache.     . albuterol (PROVENTIL HFA;VENTOLIN HFA) 108 (90 Base) MCG/ACT inhaler Inhale 2-4 puffs into the lungs daily as needed for shortness of breath.     Marland Kitchen amoxicillin (AMOXIL) 500 MG capsule Take 1 capsule (500 mg total) by mouth 3 (three) times daily. (Patient not taking: Reported on 12/16/2015) 90 capsule 4  . aspirin 325 MG tablet Take 1 tablet (325 mg total) by mouth 2 (two) times daily. (Patient taking differently: Take 325 mg by mouth daily. ) 60 tablet 0  . Cholecalciferol 2000 units CAPS Take 6,000 Units by mouth daily.     Marland Kitchen CLINPRO 5000 1.1 % PSTE Take 1 application by mouth 2 (two) times a week.    . dicyclomine (BENTYL) 20 MG tablet Take 1 tablet (20 mg total) by mouth 2 (two) times daily. (Patient not taking: Reported on 12/16/2015) 20 tablet 0  . escitalopram (LEXAPRO) 20 MG tablet Take 20 mg by mouth at bedtime.     . ferrous sulfate 325 (65 FE) MG tablet Take 1 tablet (325 mg total) by mouth 2 (two) times daily with a meal. (Patient not taking: Reported on 12/16/2015) 60 tablet 2  . hydrOXYzine (ATARAX/VISTARIL) 25 MG tablet Take 1 tablet (25 mg total) by mouth 3 (three) times daily as needed. 60 tablet 0  . ibuprofen (ADVIL,MOTRIN) 200 MG tablet Take 400 mg by mouth every 6 (six) hours as needed for mild pain.    Marland Kitchen levothyroxine (SYNTHROID, LEVOTHROID) 200 MCG tablet Take 200 mcg by mouth daily before breakfast.    . loperamide (IMODIUM) 2 MG capsule Take 2 capsules (4 mg total) by mouth every 6 (six) hours. (Patient not taking: Reported on 12/16/2015) 30 capsule 0  . methocarbamol (ROBAXIN) 500  MG tablet Take 1 tablet (500 mg total) by mouth every 6 (six) hours as needed for muscle spasms. (Patient not taking: Reported on 12/16/2015) 20 tablet 0  . mirtazapine (REMERON) 15 MG tablet Take 1 tablet (15 mg total) by mouth at bedtime. 30 tablet 0  . ondansetron (ZOFRAN) 4 MG tablet Take 1 tablet (4 mg total) by mouth every 8 (eight) hours as needed for nausea or vomiting. 20 tablet 0  . oxyCODONE 10 MG TABS Take 1 tablet (10 mg total) by mouth every 4 (four) hours as needed for severe pain. (Patient taking differently: Take 10 mg by mouth 2 (two) times daily. ) 30 tablet 0  . promethazine (PHENERGAN) 12.5 MG tablet Take 25 mg by mouth every 6 (six) hours as needed for nausea or vomiting.     . propranolol ER (INDERAL LA) 60 MG 24 hr capsule Take 60 mg by mouth every morning.     . silodosin (RAPAFLO) 8 MG CAPS capsule Take 8 mg by mouth daily with breakfast.     . Sofosbuvir-Velpatasvir (EPCLUSA) 400-100 MG TABS Take 1 tablet by mouth daily. 28 tablet 2  . sucralfate (CARAFATE) 1 G tablet Take 1 tablet (1 g total) by mouth 4 (four) times daily -  with meals and at bedtime. (Patient not taking: Reported on 12/16/2015) 10 tablet 0  . tenofovir (VIREAD) 300 MG tablet Take 1 tablet (300 mg total) by mouth daily. 30 tablet 6  . valACYclovir (VALTREX) 1000 MG tablet Take 1,000 mg by mouth daily as needed (for flares).   2  . zolpidem (AMBIEN) 10 MG tablet Take 1 tablet (10 mg total) by mouth at bedtime as needed for sleep. 30 tablet 0  Objective: BP 100/58   Pulse 67   Temp 98.4 F (36.9 C) (Oral)   Resp 18   SpO2 95%  Exam: General: 65 y/o M, in NAD, resting comfortably in PACU bed, still sleepy but easy to arouse and waking up from sedation Eyes: EOMI, pupils pinpoint (recieved Dilaudid in ER), no conjunctival injection, no scleral icterus ENTM: mildly dry mucous membranes, oropharynx clear Neck: normal, supple Cardiovascular: RRR, no MRG, 2+ DP pulses bilatearally  Respiratory: CTA B/L  with no wheezing, rhonchi or rales appreciated on exam.  Normal effort of breathing on 2L O2 per [place] Gastrointestinal: soft, NT, ND +bs, no masses, no hepatomegaly noted MSK: R leg in sterile dressing with knee drain placed, no edema noted, paraspinal tenderness over left lumbar area, full ROM of LLE  Derm: warm, dry, well-perfused, no rashes noted Neuro: AAOx3, sleepy-appearing as he is waking up from anesthesia, CN intact with no focal deficits, 5/5 strength and sensation of bilateral UE and LLE.  RLE strength limited 2/2 pain, but sensation intact.   Psych: pleasant, mood appropriate  Labs and Imaging: CBC BMET   Recent Labs Lab 12/29/15 1119  WBC 15.0*  HGB 14.0  HCT 41.0  PLT 230    Recent Labs Lab 12/29/15 1119  NA 134*  K 4.3  CL 103  CO2 23  BUN 10  CREATININE 1.51*  GLUCOSE 104*  CALCIUM 8.4*    Alk Phos: 130 AST 51 ALT 31 T bili 1.8 CRP 8.2 LA 1.5 > 4.57 > 0.8 UA: negative for signs of infection, 30 protein  EKG: NSR  Blood cx: pending Urine Cuture: pending R Knee Synovial Fluid Gram Stain: abundant wbc predominantly PMH, rare gram positive cocci in clusters  Synovial Fluid cx: pending   Dg Chest Portable 1 View  Result Date: 12/29/2015 CLINICAL DATA:  Hypotension EXAM: PORTABLE CHEST 1 VIEW COMPARISON:  12/04/2015 FINDINGS: The heart size and mediastinal contours are within normal limits. Both lungs are clear. The visualized skeletal structures are unremarkable. Remote left inferior rib fracture with nonunion. IMPRESSION: Stable chest exam.  No superimposed acute process. Electronically Signed   By: Jerilynn Mages.  Shick M.D.   On: 12/29/2015 12:04   [name], MD 12/29/2015, 1:39 PM PGY-1, [place] Intern pager: [phone], text pages welcome  UPPER LEVEL ADDENDUM  I have read the above note and made revisions highlighted in blue.  Smiley Houseman, MD PGY-2 [facility] Pager [phone]

## 2015-12-29 NOTE — Transfer of Care (Signed)
Immediate Anesthesia Transfer of Care Note  Patient: Juan Wiggins  Procedure(s) Performed: Procedure(s): ARTHROSCOPIC IRRIGATION AND DEBRIEDMENT RIGHT  KNEE (Right)  Patient Location: PACU  Anesthesia Type:General  Level of Consciousness: awake  Airway & Oxygen Therapy: Patient Spontanous Breathing and Patient connected to nasal cannula oxygen  Post-op Assessment: Report given to RN and Post -op Vital signs reviewed and stable  Post vital signs: Reviewed and stable  Last Vitals:  Vitals:   12/29/15 1300 12/29/15 1315  BP: (!) 104/52 100/58  Pulse: 69 67  Resp: 17 18  Temp:      Last Pain:  Vitals:   12/29/15 1016  TempSrc:   PainSc: 9          Complications: No apparent anesthesia complications

## 2015-12-29 NOTE — Brief Op Note (Signed)
12/29/2015  3:04 PM  PATIENT:  Juan Wiggins  65 y.o. male  PRE-OPERATIVE DIAGNOSIS:  Infected Right Knee  POST-OPERATIVE DIAGNOSIS:  Infected Right Knee  PROCEDURE:  Procedure(s): ARTHROSCOPIC IRRIGATION AND DEBRIEDMENT RIGHT  KNEE (Right)  SURGEON:  Surgeon(s) and Role:    * Melina Schools, MD - Primary  PHYSICIAN ASSISTANT:   ASSISTANTS: none   ANESTHESIA:   general  EBL:  Total I/O In: 3800 [I.V.:3800] Out: 255 [Urine:250; Blood:5]  BLOOD ADMINISTERED:none  DRAINS: 1 drain in the knee   LOCAL MEDICATIONS USED:  NONE  SPECIMEN:  No Specimen  DISPOSITION OF SPECIMEN:  N/A  COUNTS:  YES  TOURNIQUET:  * No tourniquets in log *  DICTATION: .Other Dictation: Dictation Number [id number]  PLAN OF CARE: Admit to inpatient   PATIENT DISPOSITION:  PACU - hemodynamically stable.

## 2015-12-29 NOTE — ED Notes (Signed)
Report to John in anesthesia/OR. Unable to give cefipime because it is in pharmacy.  Jenny Reichmann is aware.

## 2015-12-29 NOTE — ED Triage Notes (Addendum)
Pt reports right knee pain and lower back pain. Hx of right knee infection back in may, had +strep infection which resulted in surgery. Reports this pain is similar. No relief with percocet at home. Currently being treated for hep B & C.

## 2015-12-29 NOTE — Anesthesia Postprocedure Evaluation (Signed)
Anesthesia Post Note  Patient: Juan Wiggins  Procedure(s) Performed: Procedure(s) (LRB): ARTHROSCOPIC IRRIGATION AND DEBRIEDMENT RIGHT  KNEE (Right)  Patient location during evaluation: PACU Anesthesia Type: General Level of consciousness: sedated and patient cooperative Pain management: pain level controlled Vital Signs Assessment: post-procedure vital signs reviewed and stable Respiratory status: spontaneous breathing Cardiovascular status: stable Anesthetic complications: no    Last Vitals:  Vitals:   12/29/15 1703 12/29/15 1923  BP: (!) 102/56 (!) 100/57  Pulse: 68 70  Resp: 18 19  Temp: 36.9 C 36.7 C    Last Pain:  Vitals:   12/29/15 1923  TempSrc: Oral  PainSc: [place]

## 2015-12-29 NOTE — ED Provider Notes (Signed)
Complains of low nonradiating back pain for approximately one week. Complains of right knee pain onset 2 days ago. Back pain and knee pain are worse with changing positions improved with many still. On exam patient is alert nontoxic not acutely ill-appearing back is without point tenderness or flank tenderness. He has pain when he sits up in bed from a supine position. Right lower extremity surgical scar over anterior knee knee is swollen red and warm anteriorly, with corresponding tenderness. DP pulse 2+. Good capillary refill all other extremities without redness swelling or tenderness neurovascular intact. Code sepsis called based on Sirs criteria hypotension leukocytosis, source of infection being infected right knee   Orlie Dakin, MD 12/29/15 1151

## 2015-12-29 NOTE — Op Note (Signed)
NAME:  Juan Wiggins, Juan Wiggins NO.:  [id number]  MEDICAL RECORD NO.:  [id number]  LOCATION:  [facility]                         FACILITY:  [facility]  PHYSICIAN:  Kayra Crowell D. Rolena Infante, M.D. DATE OF BIRTH:  [birth year]  DATE OF PROCEDURE:  12/29/2015 DATE OF DISCHARGE:                              OPERATIVE REPORT   PREOPERATIVE DIAGNOSIS:  Right total knee arthroplasty infection.  POSTOPERATIVE DIAGNOSIS:  Right total knee arthroplasty infection.  OPERATIVE PROCEDURE:  Arthroscopic incision and drainage of right knee.  COMPLICATIONS:  None.  CONDITION:  Stable.  HISTORY:  This is a very pleasant 65 year old gentleman who developed a right total knee infection approximately a year and a half after his knee replacement.  The patient recently back in May had a poly exchange and was doing well until 3 days ago he started developing significant knee pain and swelling and so came to the ER today.  Clinical evaluation in the ER demonstrated that he had swelling, pain and aspiration of the knee expressed purulent material.  As a result, I elected to take him to the operating room for the aforementioned procedure.  All appropriate risks were explained to the patient and consent was obtained.  OPERATIVE NOTE:  The patient was brought to the operating room and placed supine on the operating table.  After successful induction of general anesthesia and endotracheal intubation, the right lower extremity was prepped and draped in a standard fashion.  Time-out was taken confirming the patient, procedure, and all other pertinent important data.  An 11 blade scalpel was used on the lateral aspect of the distal femur for a skin incision, and an inflow portal was inserted into the joint.  The knee was then flexed and a medial parapatellar incision was made and a second cannula was inserted.  I then irrigated 6 L of fluid through the knee.  Initially, there was purulent material coming out of the  knee.  At the conclusion, it was clear fluid.  No evidence of ongoing purulent material within the knee.  A drain was placed through 1 of the portals and both portals were closed with a simple 3-0 Monocryl stitch.  A dry dressing and an Ace wrap was applied and the patient was extubated, transferred to PACU without incident.  At the end of the case, all needle and sponge counts were correct.  There were no adverse intraoperative events.    Tywanda Rice D. Rolena Infante, M.D.    DDB/MEDQ  D:  12/29/2015  T:  12/29/2015  Job:  [id number]  cc:   Dr. Rolena Infante' office

## 2015-12-29 NOTE — Progress Notes (Addendum)
Pharmacy Antibiotic Note  Juan Wiggins is a 65 y.o. male admitted on 12/29/2015 with septic joint.  Pharmacy has been consulted for vancomycin/cefepime dosing. Pt reports painful swelling and warmth to R knee and it feels similar to prior septic joint infection he had May 2017. Blood cultures have been sent, plan for I&D today per ortho. CrCl ~53 ml/min, WBC 15, afebrile.  Plan: -Cefepime 1g IV q8h -Vancomycin 1000mg  IV x1 -Vancomycin 750mg  IV q12h -Follow-up ortho plans, S/Sx infection, renal function, vancomycin level as indicated   Temp (24hrs), Avg:98.4 F (36.9 C), Min:98.4 F (36.9 C), Max:98.4 F (36.9 C)   Recent Labs Lab 12/29/15 1119 12/29/15 1141  WBC 15.0*  --   CREATININE 1.51*  --   LATICACIDVEN  --  1.50    Estimated Creatinine Clearance: 53.3 mL/min (by C-G formula based on SCr of 1.51 mg/dL (H)).    Allergies  Allergen Reactions  . Heparin Other (See Comments)    Low platelets (130s), no HIT testing performed, platelets recovered.  . Wellbutrin [Bupropion] Other (See Comments)    Hallucinations-started on 150mg  daily dosage    Antimicrobials this admission: 11/5 Cefepime >>  11/5 Vancomycin >>   Dose adjustments this admission: none  Microbiology results: 11/5 BCx: IP   Thank you for allowing pharmacy to be a part of this patient's care.  Arrie Senate, PharmD PGY-1 Pharmacy Resident Pager: [phone] 12/29/2015

## 2015-12-29 NOTE — Anesthesia Preprocedure Evaluation (Addendum)
Anesthesia Evaluation  Patient identified by MRN, date of birth, ID band Patient awake    Reviewed: Allergy & Precautions, H&P , NPO status , Patient's Chart, lab work & pertinent test results, reviewed documented beta blocker date and time   Airway Mallampati: II  TM Distance: >3 FB Neck ROM: full    Dental no notable dental hx.    Pulmonary sleep apnea , former smoker,    Pulmonary exam normal breath sounds clear to auscultation       Cardiovascular hypertension, Pt. on medications and Pt. on home beta blockers + Peripheral Vascular Disease  + Valvular Problems/Murmurs  Rhythm:regular Rate:Normal     Neuro/Psych PSYCHIATRIC DISORDERS Anxiety Depression    GI/Hepatic GERD  ,(+) Hepatitis -  Endo/Other  Hypothyroidism   Renal/GU Renal disease     Musculoskeletal   Abdominal   Peds  Hematology  (+) anemia ,   Anesthesia Other Findings Hypertension    Thyroid disease      Hemorrhoids   Hypothyroidism      Tachycardia    Depression      Anxiety    GERD    Arthritis   Hepatitis A     Lower back pain    Sleep apnea  COULD NOT TOLERATE CPAP MASK    Heart murmur     Reproductive/Obstetrics                            Anesthesia Physical  Anesthesia Plan  ASA: III  Anesthesia Plan:    Post-op Pain Management:    Induction: Intravenous  Airway Management Planned: Oral ETT  Additional Equipment:   Intra-op Plan:   Post-operative Plan: Extubation in OR  Informed Consent: I have reviewed the patients History and Physical, chart, labs and discussed the procedure including the risks, benefits and alternatives for the proposed anesthesia with the patient or authorized representative who has indicated his/her understanding and acceptance.   Dental advisory given  Plan Discussed with: CRNA  Anesthesia Plan Comments:        Anesthesia Quick Evaluation

## 2015-12-29 NOTE — H&P (Signed)
Juan Rima, MD Chief Complaint: r TK infection History: Complains of low nonradiating back pain for approximately one week. Complains of right knee pain onset 2 days ago. Back pain and knee pain are worse with changing positions improved with many still. On exam patient is alert nontoxic not acutely ill-appearing back is without point tenderness or flank tenderness. He has pain when he sits up in bed from a supine position. Right lower extremity surgical scar over anterior knee knee is swollen red and warm anteriorly, with corresponding tenderness. DP pulse 2+. Good capillary refill all other extremities without redness swelling or tenderness neurovascular intact. Code sepsis called based on Sirs criteria hypotension leukocytosis, source of infection being infected right knee Past Medical History:  Diagnosis Date  . Anxiety   . Arthritis    RHEUMATOID ARTHRITIS; OA LEFT KNEE  . Cancer (HCC)    MELANOMA REMOVED RT SHOULDER  . Depression   . GERD (gastroesophageal reflux disease)    PREVACID IF NEEDED  . Heart murmur   . Hemorrhoids   . Hepatitis A    PT STATES TYPE OF HEPATITIS YOU GET FROM SHELLFISH  . Hepatitis B   . Hepatitis C   . Hypertension   . Hypothyroidism   . 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 Pancreatitis, alcoholic, acute   . Sleep apnea    CLAUSTROPHOBIC - COULD NOT TOLERATE CPAP MASK  . Tachycardia    PT STATES HIS HEART RATE USUALLY 100 OR MORE  . Thyroid disease     Allergies  Allergen Reactions  . Heparin Other (See Comments)    Low platelets (130s), no HIT testing performed, platelets recovered.  . Wellbutrin [Bupropion] Other (See Comments)    Hallucinations-started on 150mg  daily dosage    No current facility-administered medications on file prior to encounter.    Current Outpatient Prescriptions on File Prior to Encounter  Medication Sig Dispense Refill  . acetaminophen (TYLENOL) 325 MG tablet Take 650  mg by mouth every 6 (six) hours as needed for headache.     . albuterol (PROVENTIL HFA;VENTOLIN HFA) 108 (90 Base) MCG/ACT inhaler Inhale 2-4 puffs into the lungs daily as needed for shortness of breath.     Marland Kitchen amoxicillin (AMOXIL) 500 MG capsule Take 1 capsule (500 mg total) by mouth 3 (three) times daily. (Patient not taking: Reported on 12/16/2015) 90 capsule 4  . aspirin 325 MG tablet Take 1 tablet (325 mg total) by mouth 2 (two) times daily. (Patient taking differently: Take 325 mg by mouth daily. ) 60 tablet 0  . Cholecalciferol 2000 units CAPS Take 6,000 Units by mouth daily.     Marland Kitchen CLINPRO 5000 1.1 % PSTE Take 1 application by mouth 2 (two) times a week.    . dicyclomine (BENTYL) 20 MG tablet Take 1 tablet (20 mg total) by mouth 2 (two) times daily. (Patient not taking: Reported on 12/16/2015) 20 tablet 0  . escitalopram (LEXAPRO) 20 MG tablet Take 20 mg by mouth at bedtime.     . ferrous sulfate 325 (65 FE) MG tablet Take 1 tablet (325 mg total) by mouth 2 (two) times daily with a meal. (Patient not taking: Reported on 12/16/2015) 60 tablet 2  . hydrOXYzine (ATARAX/VISTARIL) 25 MG tablet Take 1 tablet (25 mg total) by mouth 3 (three) times daily as needed. 60 tablet 0  . ibuprofen (ADVIL,MOTRIN) 200 MG tablet Take 400 mg by mouth every 6 (six)  hours as needed for mild pain.    Marland Kitchen levothyroxine (SYNTHROID, LEVOTHROID) 200 MCG tablet Take 200 mcg by mouth daily before breakfast.    . loperamide (IMODIUM) 2 MG capsule Take 2 capsules (4 mg total) by mouth every 6 (six) hours. (Patient not taking: Reported on 12/16/2015) 30 capsule 0  . methocarbamol (ROBAXIN) 500 MG tablet Take 1 tablet (500 mg total) by mouth every 6 (six) hours as needed for muscle spasms. (Patient not taking: Reported on 12/16/2015) 20 tablet 0  . mirtazapine (REMERON) 15 MG tablet Take 1 tablet (15 mg total) by mouth at bedtime. 30 tablet 0  . ondansetron (ZOFRAN) 4 MG tablet Take 1 tablet (4 mg total) by mouth every 8 (eight)  hours as needed for nausea or vomiting. 20 tablet 0  . oxyCODONE 10 MG TABS Take 1 tablet (10 mg total) by mouth every 4 (four) hours as needed for severe pain. (Patient taking differently: Take 10 mg by mouth 2 (two) times daily. ) 30 tablet 0  . promethazine (PHENERGAN) 12.5 MG tablet Take 25 mg by mouth every 6 (six) hours as needed for nausea or vomiting.     . propranolol ER (INDERAL LA) 60 MG 24 hr capsule Take 60 mg by mouth every morning.     . silodosin (RAPAFLO) 8 MG CAPS capsule Take 8 mg by mouth daily with breakfast.     . Sofosbuvir-Velpatasvir (EPCLUSA) 400-100 MG TABS Take 1 tablet by mouth daily. 28 tablet 2  . sucralfate (CARAFATE) 1 G tablet Take 1 tablet (1 g total) by mouth 4 (four) times daily -  with meals and at bedtime. (Patient not taking: Reported on 12/16/2015) 10 tablet 0  . tenofovir (VIREAD) 300 MG tablet Take 1 tablet (300 mg total) by mouth daily. 30 tablet 6  . valACYclovir (VALTREX) 1000 MG tablet Take 1,000 mg by mouth daily as needed (for flares).   2  . zolpidem (AMBIEN) 10 MG tablet Take 1 tablet (10 mg total) by mouth at bedtime as needed for sleep. 30 tablet 0    Physical Exam: Vitals:   12/29/15 1200 12/29/15 1230  BP: 101/57 96/81  Pulse: 65 69  Resp: 14 11  Temp:     A+O X3 No SOB/CP abd soft/nt   Compartments soft/NT Right knee: swollen and tender to palpation  Surgical wound well healed no signs of breakdown or local trauma.   Image: Dg Chest 2 View  Result Date: 12/04/2015 CLINICAL DATA:  Confusion.  Jaundice. EXAM: CHEST  2 VIEW COMPARISON:  07/14/2015 FINDINGS: The heart size and mediastinal contours are within normal limits. Both lungs are clear. The visualized skeletal structures are unremarkable. IMPRESSION: No active cardiopulmonary disease. Electronically Signed   By: Kerby Moors M.D.   On: 12/04/2015 19:16   Dg Chest Portable 1 View  Result Date: 12/29/2015 CLINICAL DATA:  Hypotension EXAM: PORTABLE CHEST 1 VIEW COMPARISON:   12/04/2015 FINDINGS: The heart size and mediastinal contours are within normal limits. Both lungs are clear. The visualized skeletal structures are unremarkable. Remote left inferior rib fracture with nonunion. IMPRESSION: Stable chest exam.  No superimposed acute process. Electronically Signed   By: Jerilynn Mages.  Shick M.D.   On: 12/29/2015 12:04   US Abdomen Complete W/elastography  Result Date: 12/17/2015 CLINICAL DATA:  Chronic hepatitis-C without hepatic coma. Alcohol dependency. EXAM: ULTRASOUND ABDOMEN ULTRASOUND HEPATIC ELASTOGRAPHY TECHNIQUE: Sonography of the upper abdomen was performed. In addition, ultrasound elastography evaluation of the liver was performed. A region of  interest was placed within the right lobe of the liver. Following application of a compressive sonographic pulse, shear waves were detected in the adjacent hepatic tissue and the shear wave velocity was calculated. Multiple assessments were performed at the selected site. Median shear wave velocity is correlated to a Metavir fibrosis score. COMPARISON:  Right upper quadrant ultrasound on 12/04/2015 and AP CT on 12/01/2014 FINDINGS: ULTRASOUND ABDOMEN Gallbladder: No gallstones are identified. The gallbladder is incompletely distended and shows mild diffuse gallbladder wall thickening which is new since recent study of 12/04/2015, and likely due to incomplete gallbladder distention. No evidence of pericholecystic fluid. No sonographic Murphy sign noted by sonographer. Common bile duct: Diameter: 5 mm, within normal limits. Liver: Within normal limits in parenchymal echogenicity. Tiny sub-cm cyst again noted. No liver masses identified. Portal vein is patent. IVC: No abnormality visualized. Pancreas: Visualized portion unremarkable. Spleen: Size and appearance within normal limits. Right Kidney: Length: 11.7 cm. Echogenicity within normal limits. No mass or hydronephrosis visualized. Left Kidney: Length: 13.8 cm. Echogenicity within normal  limits. Cysts again seen in the mid and lower poles, largest measuring 5.3 cm. No mass or hydronephrosis visualized. Abdominal aorta: No aneurysm visualized. Other findings: None. ULTRASOUND HEPATIC ELASTOGRAPHY Device: Siemens Helix VTQ Patient position: Left Lateral Decubitus Transducer 6C1 Number of measurements: 10 Hepatic segment:  8 Median velocity:   3.38  m/sec IQR: 0.68 IQR/Median velocity ratio: 0.20 Corresponding Metavir fibrosis score:  Some F3 + F4 Risk of fibrosis: High Limitations of exam: None Pertinent findings noted on other imaging exams:  None Please note that abnormal shear wave velocities may also be identified in clinical settings other than with hepatic fibrosis, such as: acute hepatitis, elevated right heart and central venous pressures including use of beta blockers, veno-occlusive disease (Budd-Chiari), infiltrative processes such as mastocytosis/amyloidosis/infiltrative tumor, extrahepatic cholestasis, in the post-prandial state, and liver transplantation. Correlation with patient history, laboratory data, and clinical condition recommended. IMPRESSION: ULTRASOUND ABDOMEN: No evidence of gallstones or biliary ductal dilatation. Diffuse gallbladder wall thickening is likely due to incomplete gallbladder distention on this exam. Tiny sub-cm hepatic cysts. Otherwise unremarkable appearance of liver. No liver mass identified. Benign-appearing left renal cysts.  No evidence of hydronephrosis. ULTRASOUND HEPATIC ELASTOGRAPHY: Median hepatic shear wave velocity is calculated at 3.38 m/sec. Corresponding Metavir fibrosis score is  Some F3 + F4. Risk of fibrosis is High. Follow-up: Follow up advised Electronically Signed   By: Earle Gell M.D.   On: 12/17/2015 14:41   US Abdomen Limited Ruq  Result Date: 12/04/2015 CLINICAL DATA:  Right upper quadrant pain and jaundice, history of hepatitis-B and hepatitis-C EXAM: US ABDOMEN LIMITED - RIGHT UPPER QUADRANT COMPARISON:  12/01/2014 FINDINGS:  Gallbladder: Gallbladder is well distended. No pericholecystic fluid is noted. Negative sonographic Percell Miller sign is seen. Common bile duct: Diameter: 9 mm but tapers normally distally. Liver: 7 mm cyst is noted within the right lobe anteriorly. Mild nodularity is noted. This is likely related to the underlying history of hepatitis. IMPRESSION: Well distended gallbladder without signs of cholecystitis. Mildly dilated common bile duct although tapers normally distally. Electronically Signed   By: Inez Catalina M.D.   On: 12/04/2015 21:53    A/P: Patients BP responded fluids Aspirate by report was purulent  Plan on arthroscopic I&D today Will need medicine to follow Dr Emiliano Dyer will re-eval in the AM

## 2015-12-29 NOTE — Anesthesia Procedure Notes (Signed)
Procedure Name: Intubation Date/Time: 12/29/2015 2:30 PM Performed by: Oletta Lamas Pre-anesthesia Checklist: Patient identified, Emergency Drugs available, Suction available and Patient being monitored Patient Re-evaluated:Patient Re-evaluated prior to inductionOxygen Delivery Method: Circle System Utilized Preoxygenation: Pre-oxygenation with 100% oxygen Intubation Type: IV induction Ventilation: Mask ventilation without difficulty Laryngoscope Size: Mac and 3 Grade View: Grade III Tube type: Oral Number of attempts: 1 Airway Equipment and Method: Stylet and Oral airway Placement Confirmation: ETT inserted through vocal cords under direct vision,  positive ETCO2 and breath sounds checked- equal and bilateral Secured at: 24 cm Tube secured with: Tape Dental Injury: Teeth and Oropharynx as per pre-operative assessment

## 2015-12-29 NOTE — ED Provider Notes (Signed)
[facility] DEPT Provider Note   CSN: [id number] Arrival date & time: 12/29/15  [id number]     History   Chief Complaint Chief Complaint  Patient presents with  . Back Pain  . Knee Pain   HPI   Blood pressure (!) 94/54, pulse 66, temperature 98.4 F (36.9 C), temperature source Oral, resp. rate 14, SpO2 97 %.  Juan Wiggins is a 65 y.o. male complaining of painful swelling and warmth to right knee worsening over the course of the last 4 days, states it feels similar to when he had a septic joint with strep a which required washout by Dr. Gladstone Lighter  2017, as per his sister his hardware was not replaced but there was a new sleeve. There is been no trauma to the affected joint, ambulation has been difficult even with a cane which is atypical for him. He denies fever, chills states that he initially had some nausea and vomiting several days ago. He also has some left flank pain with no change in urination, urinary frequency. He has been taking oxycodone 5 mg 3 times a day when necessary for chronic pain with no relief in the knee pain. It is mouth feels dry, has been nothing by mouth since this a.m. in anticipation of possible washout. States it feels similar to admission in May with septic joint and severe sepsis.  PCP [facility])  Past Medical History:  Diagnosis Date  . Anxiety   . Arthritis    RHEUMATOID ARTHRITIS; OA LEFT KNEE  . Cancer (HCC)    MELANOMA REMOVED RT SHOULDER  . Depression   . GERD (gastroesophageal reflux disease)    PREVACID IF NEEDED  . Heart murmur   . Hemorrhoids   . Hepatitis A    PT STATES TYPE OF HEPATITIS YOU GET FROM SHELLFISH  . Hepatitis B   . Hepatitis C   . Hypertension   . Hypothyroidism   . 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 Pancreatitis, alcoholic, acute   . Sleep apnea    CLAUSTROPHOBIC - COULD NOT TOLERATE CPAP MASK  . Tachycardia    PT STATES HIS HEART RATE USUALLY 100 OR MORE  .  Thyroid disease     Patient Active Problem List   Diagnosis Date Noted  . Sepsis ([place]) 12/29/2015  . Septic arthritis of knee, right ([place]) 07/18/2015  . Bacteremia due to Streptococcus 07/18/2015  . Cellulitis of right upper extremity 07/18/2015  . Hepatitis C 07/18/2015  . Anemia 07/18/2015  . Opacity of lung on imaging study, bilateral 07/18/2015  . Anxiety state 07/18/2015  . History of total knee arthroplasty 07/16/2015  . Severe sepsis ([place]) 07/14/2015  . Moderate episode of recurrent major depressive disorder ([place])   . Chronic pain syndrome 05/21/2014  . Essential hypertension, benign 08/28/2013  . GERD (gastroesophageal reflux disease) 08/28/2013  . Osteoarthritis of left knee 08/23/2013  . Total knee replacement status 08/23/2013  . Alcohol dependence ([place]) 07/14/2013  . Alcohol dependency ([place]) 07/07/2013  . Major depression 07/07/2013  . Varicose veins of lower extremities with other complications [id number]  . Recurrent falls 04/14/2011  . Hypotension 04/13/2011  . ARF (acute renal failure) ([place]) 04/13/2011  . Hypothyroidism 04/13/2011  . Prolapsed and thrombosed 09/03/2010    Past Surgical History:  Procedure Laterality Date  . colonscopy     . ELBOW SURGERY     BILATERAL ELBOW SURGERY  . I&D KNEE WITH  POLY EXCHANGE Right 07/16/2015   Procedure: IRRIGATION AND DEBRIDEMENT RIGHT KNEE WITH POLY EXCHANGE AND PLACEMENT OF ANTIBIOTIC BEADS;  Surgeon: Latanya Maudlin, MD;  Location: [facility];  Service: Orthopedics;  Laterality: Right;  . KNEE ARTHROSCOPY Right 07/14/2015   Procedure: ARTHROSCOPY IRRIGATION AND DEBRIDEMENT - KNEE;  Surgeon: Justice Britain, MD;  Location: [facility];  Service: Orthopedics;  Laterality: Right;  . KNEE SURGERY     BILATERAL KNEE ARTHROSCOPY  . left TKR     July 2015  . LUMBAR LAMINECTOMY/DECOMPRESSION MICRODISCECTOMY Left 06/06/2014   Procedure: LEFT L4-5 DECOMPRESSION ;  Surgeon: Melina Schools, MD;  Location: [facility];  Service: Orthopedics;   Laterality: Left;  . REFRACTIVE SURGERY    . SHOULDER SURGERY     RIGHT ROTATOR CUFF REPAIR AND LEFT ARTHROSCOPY  . TOTAL KNEE ARTHROPLASTY Left 08/23/2013   Procedure: LEFT TOTAL KNEE ARTHROPLASTY;  Surgeon: Tobi Bastos, MD;  Location: [facility];  Service: Orthopedics;  Laterality: Left;  . TOTAL KNEE ARTHROPLASTY Right 02/07/2014   Procedure: RIGHT TOTAL KNEE ARTHROPLASTY;  Surgeon: Tobi Bastos, MD;  Location: [facility];  Service: Orthopedics;  Laterality: Right;       Home Medications    Prior to Admission medications   Medication Sig Start Date End Date Taking? Authorizing Provider  acetaminophen (TYLENOL) 325 MG tablet Take 650 mg by mouth every 6 (six) hours as needed for headache.     Historical Provider, MD  albuterol (PROVENTIL HFA;VENTOLIN HFA) 108 (90 Base) MCG/ACT inhaler Inhale 2-4 puffs into the lungs daily as needed for shortness of breath.  04/25/15   Historical Provider, MD  amoxicillin (AMOXIL) 500 MG capsule Take 1 capsule (500 mg total) by mouth 3 (three) times daily. Patient not taking: Reported on 12/16/2015 09/03/15   Carlyle Basques, MD  aspirin 325 MG tablet Take 1 tablet (325 mg total) by mouth 2 (two) times daily. Patient taking differently: Take 325 mg by mouth daily.  07/21/15   Nita Sells, MD  Cholecalciferol 2000 units CAPS Take 6,000 Units by mouth daily.     Historical Provider, MD  CLINPRO 5000 1.1 % PSTE Take 1 application by mouth 2 (two) times a week. 10/11/15   Historical Provider, MD  dicyclomine (BENTYL) 20 MG tablet Take 1 tablet (20 mg total) by mouth 2 (two) times daily. Patient not taking: Reported on 12/16/2015 10/22/14   Tatyana Kirichenko, PA-C  escitalopram (LEXAPRO) 20 MG tablet Take 20 mg by mouth at bedtime.     Historical Provider, MD  ferrous sulfate 325 (65 FE) MG tablet Take 1 tablet (325 mg total) by mouth 2 (two) times daily with a meal. Patient not taking: Reported on 12/16/2015 07/21/15   Nita Sells, MD  hydrOXYzine  (ATARAX/VISTARIL) 25 MG tablet Take 1 tablet (25 mg total) by mouth 3 (three) times daily as needed. 12/18/15   Norman Clay, MD  ibuprofen (ADVIL,MOTRIN) 200 MG tablet Take 400 mg by mouth every 6 (six) hours as needed for mild pain.    Historical Provider, MD  levothyroxine (SYNTHROID, LEVOTHROID) 200 MCG tablet Take 200 mcg by mouth daily before breakfast.    Historical Provider, MD  loperamide (IMODIUM) 2 MG capsule Take 2 capsules (4 mg total) by mouth every 6 (six) hours. Patient not taking: Reported on 12/16/2015 07/21/15   Nita Sells, MD  methocarbamol (ROBAXIN) 500 MG tablet Take 1 tablet (500 mg total) by mouth every 6 (six) hours as needed for muscle spasms. Patient not taking: Reported on 12/16/2015  07/21/15   Nita Sells, MD  mirtazapine (REMERON) 15 MG tablet Take 1 tablet (15 mg total) by mouth at bedtime. 12/18/15 12/17/16  Norman Clay, MD  ondansetron (ZOFRAN) 4 MG tablet Take 1 tablet (4 mg total) by mouth every 8 (eight) hours as needed for nausea or vomiting. 12/16/15   Carlyle Basques, MD  oxyCODONE 10 MG TABS Take 1 tablet (10 mg total) by mouth every 4 (four) hours as needed for severe pain. Patient taking differently: Take 10 mg by mouth 2 (two) times daily.  07/21/15   Nita Sells, MD  promethazine (PHENERGAN) 12.5 MG tablet Take 25 mg by mouth every 6 (six) hours as needed for nausea or vomiting.     Historical Provider, MD  propranolol ER (INDERAL LA) 60 MG 24 hr capsule Take 60 mg by mouth every morning.  01/30/15 01/30/16  Historical Provider, MD  silodosin (RAPAFLO) 8 MG CAPS capsule Take 8 mg by mouth daily with breakfast.     Historical Provider, MD  Sofosbuvir-Velpatasvir (EPCLUSA) 400-100 MG TABS Take 1 tablet by mouth daily. 12/23/15   Carlyle Basques, MD  sucralfate (CARAFATE) 1 G tablet Take 1 tablet (1 g total) by mouth 4 (four) times daily -  with meals and at bedtime. Patient not taking: Reported on 12/16/2015 12/01/14   Domenic Moras, PA-C    tenofovir (VIREAD) 300 MG tablet Take 1 tablet (300 mg total) by mouth daily. 10/30/15   Carlyle Basques, MD  valACYclovir (VALTREX) 1000 MG tablet Take 1,000 mg by mouth daily as needed (for flares).  04/08/15   Historical Provider, MD  zolpidem (AMBIEN) 10 MG tablet Take 1 tablet (10 mg total) by mouth at bedtime as needed for sleep. 12/18/15 01/17/16  Norman Clay, MD    Family History Family History  Problem Relation Age of Onset  . Hypertension Mother   . Other Mother     varicose veins  . Hypertension Sister     Social History Social History  Substance Use Topics  . Smoking status: Former Smoker    Packs/day: 1.50    Years: 35.00    Types: Cigars    Quit date: 02/22/1998  . Smokeless tobacco: Never Used     Comment: used cigarettes 20 years ago  . Alcohol use No     Comment: " last alcohol use was in December 2015."     Allergies   Heparin and Wellbutrin [bupropion]   Review of Systems Review of Systems  10 systems reviewed and found to be negative, except as noted in the HPI.   Physical Exam Updated Vital Signs BP 96/81   Pulse 69   Temp 98.4 F (36.9 C) (Oral)   Resp 11   SpO2 94%   Physical Exam  Constitutional: He is oriented to person, place, and time. He appears well-developed and well-nourished. No distress.  HENT:  Head: Normocephalic and atraumatic.  Extremely dry mucous membranes  Eyes: Conjunctivae and EOM are normal. Pupils are equal, round, and reactive to light.  Neck: Normal range of motion.  Cardiovascular: Normal rate, regular rhythm and intact distal pulses.   Pulmonary/Chest: Effort normal and breath sounds normal. No respiratory distress. He has no wheezes. He has no rales. He exhibits no tenderness.  Abdominal: Soft. There is no tenderness.  Genitourinary:  Genitourinary Comments: Diffuse left paralumbar muscular tenderness, no focal CVA tenderness to percussion, no midline tenderness to percussion. Strength is 5 out of 5 lower  extremities.  Musculoskeletal: Normal range of motion. He  exhibits edema and tenderness.  Right knee with remote surgical scar, positive for confusion, positive warmth, no overlying skin induration or overt trauma. Reduced range of motion with severe pain. Distally neurovascularly intact, no swelling along the calf.  Neurological: He is alert and oriented to person, place, and time.  Skin: He is not diaphoretic.  Psychiatric: He has a normal mood and affect.  Nursing note and vitals reviewed.    ED Treatments / Results  Labs (all labs ordered are listed, but only abnormal results are displayed) Labs Reviewed  CBC WITH DIFFERENTIAL/PLATELET - Abnormal; Notable for the following:       Result Value   WBC 15.0 (*)    RBC 4.19 (*)    RDW 16.7 (*)    Neutro Abs 10.0 (*)    Monocytes Absolute 2.2 (*)    All other components within normal limits  COMPREHENSIVE METABOLIC PANEL - Abnormal; Notable for the following:    Sodium 134 (*)    Glucose, Bld 104 (*)    Creatinine, Ser 1.51 (*)    Calcium 8.4 (*)    Total Protein 6.4 (*)    Albumin 2.7 (*)    AST 51 (*)    Alkaline Phosphatase 130 (*)    Total Bilirubin 1.8 (*)    GFR calc non Af Amer 47 (*)    GFR calc Af Amer 55 (*)    All other components within normal limits  PROTIME-INR - Abnormal; Notable for the following:    Prothrombin Time 16.0 (*)    All other components within normal limits  C-REACTIVE PROTEIN - Abnormal; Notable for the following:    CRP 8.2 (*)    All other components within normal limits  CULTURE, BLOOD (ROUTINE X 2)  CULTURE, BLOOD (ROUTINE X 2)  URINE CULTURE  BODY FLUID CULTURE  GRAM STAIN  SEDIMENTATION RATE  URINALYSIS, ROUTINE W REFLEX MICROSCOPIC (NOT AT [facility])  GLUCOSE, SYNOVIAL FLUID  PROTEIN, SYNOVIAL FLUID  SYNOVIAL CELL COUNT + DIFF, W/ CRYSTALS  URIC ACID, SYNOVIAL FLUID  MISC [facility] TEST (SEND OUT)  MISC [facility] TEST (SEND OUT)  MISC [facility] TEST (SEND OUT)  I-STAT CG4 LACTIC ACID, ED      EKG  EKG Interpretation None       Radiology Dg Chest Portable 1 View  Result Date: 12/29/2015 CLINICAL DATA:  Hypotension EXAM: PORTABLE CHEST 1 VIEW COMPARISON:  12/04/2015 FINDINGS: The heart size and mediastinal contours are within normal limits. Both lungs are clear. The visualized skeletal structures are unremarkable. Remote left inferior rib fracture with nonunion. IMPRESSION: Stable chest exam.  No superimposed acute process. Electronically Signed   By: Jerilynn Mages.  Shick M.D.   On: 12/29/2015 12:04    Procedures .Joint Aspiration/Arthrocentesis Date/Time: 12/29/2015 12:50 PM Performed by: Monico Blitz Authorized by: Monico Blitz   Consent:    Consent obtained:  Verbal   Consent given by:  Patient   Risks discussed:  Infection Location:    Location:  Knee Anesthesia (see MAR for exact dosages):    Anesthesia method:  Local infiltration   Local anesthetic:  Lidocaine 1% w/o epi Procedure details:    Preparation: Patient was prepped and draped in usual sterile fashion     Needle gauge:  18 G   Ultrasound guidance: no     Approach:  Superior   Aspirate amount:  20   Aspirate characteristics:  Purulent, cloudy and blood-tinged   Steroid injected: no   Post-procedure details:    Dressing:  Sterile dressing   Patient tolerance of procedure:  Tolerated well, no immediate complications   (including critical care time)  CRITICAL CARE Performed by: Monico Blitz   Total critical care time: 35 minutes  Critical care time was exclusive of separately billable procedures and treating other patients.  Critical care was necessary to treat or prevent imminent or life-threatening deterioration.  Critical care was time spent personally by me on the following activities: development of treatment plan with patient and/or surrogate as well as nursing, discussions with consultants, evaluation of patient's response to treatment, examination of patient, obtaining history  from patient or surrogate, ordering and performing treatments and interventions, ordering and review of laboratory studies, ordering and review of radiographic studies, pulse oximetry and re-evaluation of patient's condition.   Medications Ordered in ED Medications  HYDROmorphone (DILAUDID) injection 0.5 mg (not administered)  Tdap (BOOSTRIX) injection 0.5 mL (not administered)  sodium chloride 0.9 % bolus 2,000 mL (0 mLs Intravenous Stopped 12/29/15 1158)  HYDROmorphone (DILAUDID) injection 0.5 mg (0.5 mg Intravenous Given 12/29/15 1122)  HYDROmorphone (DILAUDID) injection 1 mg (1 mg Intravenous Given 12/29/15 1159)  lidocaine (PF) (XYLOCAINE) 1 % injection 5 mL (5 mLs Intradermal Given 12/29/15 1159)  sodium chloride 0.9 % bolus 1,000 mL (0 mLs Intravenous Stopped 12/29/15 1237)    And  sodium chloride 0.9 % bolus 1,000 mL (1,000 mLs Intravenous New Bag/Given 12/29/15 1153)    And  sodium chloride 0.9 % bolus 1,000 mL (1,000 mLs Intravenous New Bag/Given 12/29/15 1235)     Initial Impression / Assessment and Plan / ED Course  I have reviewed the triage vital signs and the nursing notes.  Pertinent labs & imaging results that were available during my care of the patient were reviewed by me and considered in my medical decision making (see chart for details).  Clinical Course as of Dec 29 1322  Sun Dec 29, 2015  1124 BP 81/47, 2L bolus ordered. Will check Lactic acid and blood cultures and rectal temp   [NP]    Clinical Course User Index [NP] Monico Blitz, PA-C    Vitals:   12/29/15 1115 12/29/15 1151 12/29/15 1200 12/29/15 1230  BP: (!) 81/47 103/61 101/57 96/81  Pulse: 63 66 65 69  Resp:  11 14 11   Temp:      TempSrc:      SpO2: 91% 94% 95% 94%    Medications  HYDROmorphone (DILAUDID) injection 0.5 mg (not administered)  Tdap (BOOSTRIX) injection 0.5 mL (not administered)  sodium chloride 0.9 % bolus 2,000 mL (0 mLs Intravenous Stopped 12/29/15 1158)  HYDROmorphone  (DILAUDID) injection 0.5 mg (0.5 mg Intravenous Given 12/29/15 1122)  HYDROmorphone (DILAUDID) injection 1 mg (1 mg Intravenous Given 12/29/15 1159)  lidocaine (PF) (XYLOCAINE) 1 % injection 5 mL (5 mLs Intradermal Given 12/29/15 1159)  sodium chloride 0.9 % bolus 1,000 mL (0 mLs Intravenous Stopped 12/29/15 1237)    And  sodium chloride 0.9 % bolus 1,000 mL (1,000 mLs Intravenous New Bag/Given 12/29/15 1153)    And  sodium chloride 0.9 % bolus 1,000 mL (1,000 mLs Intravenous New Bag/Given 12/29/15 1235)    Juan Wiggins is 65 y.o. male presenting with Atraumatic right knee pain and swelling, had total knee replacement in the remote past, he's had septic joint states that this feels similar. He does have an effusion and warmth consistent with septic joint, will obtain white count and call orthopedics to see if they would like to performed the arthrocentesis.  Patient  blood pressure is found to be dropping to 81/47, he has 2 L ordered, will check blood cultures and lactic acid. He has history of seeding of strep a into the bloodstream from septic joint in the past with severe sepsis and acute renal failure requiring dialysis.  Orthopedic consult from Dr. Rolena Infante appreciated: He states it is acceptable for [place] to perform the arthrocentesis in the emergency department, would like pre-op clearance from internal medicine.  Arthrocentesis performed in sterile technique, 20 mL of purulent, opaque and bloody fluid obtained and sent for culture and sensitivity, patient is started on vancomycin and cefepime. Blood pressure has responded well to hydration, map is now 75.  Dr. Rolena Infante updated, he is on his way to the ED to evaluate the patient tak him to the OR.  Unassigned admission to  [facility] resident Dr. Waldon Reining with attending Dr, Judson Roch neal.    Final Clinical Impressions(s) / ED Diagnoses   Final diagnoses:  Pyogenic arthritis of right knee joint, due to unspecified organism ([place])  Sepsis, due to  unspecified organism [facility])    New Prescriptions New Prescriptions   No medications on file     Monico Blitz, PA-C 12/29/15 [place], MD 12/29/15 1622

## 2015-12-30 ENCOUNTER — Encounter (HOSPITAL_COMMUNITY): Payer: Self-pay | Admitting: Orthopedic Surgery

## 2015-12-30 ENCOUNTER — Observation Stay (HOSPITAL_COMMUNITY): Payer: 59

## 2015-12-30 DIAGNOSIS — R1012 Left upper quadrant pain: Secondary | ICD-10-CM | POA: Diagnosis not present

## 2015-12-30 DIAGNOSIS — Y792 Prosthetic and other implants, materials and accessory orthopedic devices associated with adverse incidents: Secondary | ICD-10-CM

## 2015-12-30 DIAGNOSIS — B9561 Methicillin susceptible Staphylococcus aureus infection as the cause of diseases classified elsewhere: Secondary | ICD-10-CM | POA: Diagnosis not present

## 2015-12-30 DIAGNOSIS — I959 Hypotension, unspecified: Secondary | ICD-10-CM | POA: Diagnosis present

## 2015-12-30 DIAGNOSIS — M00861 Arthritis due to other bacteria, right knee: Secondary | ICD-10-CM

## 2015-12-30 DIAGNOSIS — M00061 Staphylococcal arthritis, right knee: Secondary | ICD-10-CM | POA: Diagnosis present

## 2015-12-30 DIAGNOSIS — K219 Gastro-esophageal reflux disease without esophagitis: Secondary | ICD-10-CM | POA: Diagnosis present

## 2015-12-30 DIAGNOSIS — F331 Major depressive disorder, recurrent, moderate: Secondary | ICD-10-CM | POA: Diagnosis present

## 2015-12-30 DIAGNOSIS — T8459XS Infection and inflammatory reaction due to other internal joint prosthesis, sequela: Secondary | ICD-10-CM | POA: Diagnosis not present

## 2015-12-30 DIAGNOSIS — R52 Pain, unspecified: Secondary | ICD-10-CM | POA: Diagnosis not present

## 2015-12-30 DIAGNOSIS — B1910 Unspecified viral hepatitis B without hepatic coma: Secondary | ICD-10-CM

## 2015-12-30 DIAGNOSIS — Z96652 Presence of left artificial knee joint: Secondary | ICD-10-CM

## 2015-12-30 DIAGNOSIS — M069 Rheumatoid arthritis, unspecified: Secondary | ICD-10-CM | POA: Diagnosis present

## 2015-12-30 DIAGNOSIS — E872 Acidosis: Secondary | ICD-10-CM | POA: Diagnosis present

## 2015-12-30 DIAGNOSIS — F40240 Claustrophobia: Secondary | ICD-10-CM | POA: Diagnosis present

## 2015-12-30 DIAGNOSIS — N289 Disorder of kidney and ureter, unspecified: Secondary | ICD-10-CM

## 2015-12-30 DIAGNOSIS — E039 Hypothyroidism, unspecified: Secondary | ICD-10-CM | POA: Diagnosis present

## 2015-12-30 DIAGNOSIS — Z79891 Long term (current) use of opiate analgesic: Secondary | ICD-10-CM | POA: Diagnosis not present

## 2015-12-30 DIAGNOSIS — F17290 Nicotine dependence, other tobacco product, uncomplicated: Secondary | ICD-10-CM | POA: Diagnosis present

## 2015-12-30 DIAGNOSIS — G4730 Sleep apnea, unspecified: Secondary | ICD-10-CM | POA: Diagnosis present

## 2015-12-30 DIAGNOSIS — J90 Pleural effusion, not elsewhere classified: Secondary | ICD-10-CM | POA: Diagnosis not present

## 2015-12-30 DIAGNOSIS — A419 Sepsis, unspecified organism: Secondary | ICD-10-CM | POA: Diagnosis not present

## 2015-12-30 DIAGNOSIS — M009 Pyogenic arthritis, unspecified: Secondary | ICD-10-CM | POA: Diagnosis not present

## 2015-12-30 DIAGNOSIS — M25552 Pain in left hip: Secondary | ICD-10-CM

## 2015-12-30 DIAGNOSIS — T8453XD Infection and inflammatory reaction due to internal right knee prosthesis, subsequent encounter: Secondary | ICD-10-CM | POA: Diagnosis not present

## 2015-12-30 DIAGNOSIS — Z85820 Personal history of malignant melanoma of skin: Secondary | ICD-10-CM | POA: Diagnosis not present

## 2015-12-30 DIAGNOSIS — I10 Essential (primary) hypertension: Secondary | ICD-10-CM | POA: Diagnosis present

## 2015-12-30 DIAGNOSIS — B181 Chronic viral hepatitis B without delta-agent: Secondary | ICD-10-CM

## 2015-12-30 DIAGNOSIS — B182 Chronic viral hepatitis C: Secondary | ICD-10-CM | POA: Diagnosis present

## 2015-12-30 DIAGNOSIS — B9689 Other specified bacterial agents as the cause of diseases classified elsewhere: Secondary | ICD-10-CM | POA: Diagnosis not present

## 2015-12-30 DIAGNOSIS — N400 Enlarged prostate without lower urinary tract symptoms: Secondary | ICD-10-CM | POA: Diagnosis present

## 2015-12-30 DIAGNOSIS — F411 Generalized anxiety disorder: Secondary | ICD-10-CM | POA: Diagnosis present

## 2015-12-30 DIAGNOSIS — Z23 Encounter for immunization: Secondary | ICD-10-CM | POA: Diagnosis not present

## 2015-12-30 DIAGNOSIS — Z791 Long term (current) use of non-steroidal anti-inflammatories (NSAID): Secondary | ICD-10-CM | POA: Diagnosis not present

## 2015-12-30 DIAGNOSIS — Z8249 Family history of ischemic heart disease and other diseases of the circulatory system: Secondary | ICD-10-CM | POA: Diagnosis not present

## 2015-12-30 DIAGNOSIS — M25561 Pain in right knee: Secondary | ICD-10-CM | POA: Diagnosis present

## 2015-12-30 DIAGNOSIS — Z7982 Long term (current) use of aspirin: Secondary | ICD-10-CM | POA: Diagnosis not present

## 2015-12-30 DIAGNOSIS — Z79899 Other long term (current) drug therapy: Secondary | ICD-10-CM | POA: Diagnosis not present

## 2015-12-30 DIAGNOSIS — B1920 Unspecified viral hepatitis C without hepatic coma: Secondary | ICD-10-CM

## 2015-12-30 DIAGNOSIS — R262 Difficulty in walking, not elsewhere classified: Secondary | ICD-10-CM | POA: Diagnosis not present

## 2015-12-30 LAB — COMPREHENSIVE METABOLIC PANEL
ALBUMIN: 2.3 g/dL — AB (ref 3.5–5.0)
ALT: 26 U/L (ref 17–63)
ANION GAP: 5 (ref 5–15)
AST: 41 U/L (ref 15–41)
Alkaline Phosphatase: 104 U/L (ref 38–126)
BUN: 9 mg/dL (ref 6–20)
CALCIUM: 7.8 mg/dL — AB (ref 8.9–10.3)
CHLORIDE: 106 mmol/L (ref 101–111)
CO2: 25 mmol/L (ref 22–32)
Creatinine, Ser: 1.4 mg/dL — ABNORMAL HIGH (ref 0.61–1.24)
GFR calc non Af Amer: 52 mL/min — ABNORMAL LOW (ref >60)
GLUCOSE: 121 mg/dL — AB (ref 65–99)
POTASSIUM: 4.5 mmol/L (ref 3.5–5.1)
SODIUM: 136 mmol/L (ref 135–145)
Total Bilirubin: 1.3 mg/dL — ABNORMAL HIGH (ref 0.3–1.2)
Total Protein: 5.7 g/dL — ABNORMAL LOW (ref 6.5–8.1)

## 2015-12-30 LAB — URINE CULTURE: Culture: <10000 — AB

## 2015-12-30 LAB — RAPID URINE DRUG SCREEN, HOSP PERFORMED
AMPHETAMINES: NOT DETECTED
BARBITURATES: NOT DETECTED
Benzodiazepines: POSITIVE — AB
COCAINE: POSITIVE — AB
OPIATES: POSITIVE — AB
TETRAHYDROCANNABINOL: NOT DETECTED

## 2015-12-30 LAB — CBC
HCT: 37.6 % — ABNORMAL LOW (ref 39.0–52.0)
Hemoglobin: 12.5 g/dL — ABNORMAL LOW (ref 13.0–17.0)
MCH: 33.2 pg (ref 26.0–34.0)
MCHC: 33.2 g/dL (ref 30.0–36.0)
MCV: 100 fL (ref 78.0–100.0)
PLATELETS: 192 10*3/uL (ref 150–400)
RBC: 3.76 MIL/uL — ABNORMAL LOW (ref 4.22–5.81)
RDW: 16.8 % — AB (ref 11.5–15.5)
WBC: 12.8 10*3/uL — ABNORMAL HIGH (ref 4.0–10.5)

## 2015-12-30 LAB — MISC LABCORP TEST (SEND OUT)
LABCORP TEST CODE: 19505
LABCORP TEST CODE: 19588
Labcorp test code: 19497

## 2015-12-30 MED ORDER — LORAZEPAM 2 MG/ML IJ SOLN: 2.0000 mg | INTRAMUSCULAR | Status: DC | PRN

## 2015-12-30 MED ORDER — CYCLOBENZAPRINE HCL 10 MG PO TABS
5.0000 mg | ORAL_TABLET | Freq: Three times a day (TID) | ORAL | Status: DC | PRN
  Administered 2015-12-30 – 2015-12-31 (×2): 5 mg via ORAL
  Filled 2015-12-30 (×3): qty 1

## 2015-12-30 MED ORDER — CYCLOBENZAPRINE HCL 5 MG PO TABS: 5.0000 mg | ORAL_TABLET | Freq: Once | ORAL | Status: DC

## 2015-12-30 MED ORDER — CYCLOBENZAPRINE HCL 5 MG PO TABS
5.0000 mg | ORAL_TABLET | Freq: Once | ORAL | Status: AC
  Administered 2015-12-30: 5 mg via ORAL

## 2015-12-30 MED ORDER — ASPIRIN 325 MG PO TABS
325.0000 mg | ORAL_TABLET | Freq: Two times a day (BID) | ORAL | Status: DC
Start: 2015-12-30 — End: 2015-12-31
  Administered 2015-12-30 – 2015-12-31 (×3): 325 mg via ORAL
  Filled 2015-12-30 (×3): qty 1

## 2015-12-30 MED ORDER — PROPRANOLOL HCL ER 60 MG PO CP24
60.0000 mg | ORAL_CAPSULE | Freq: Every day | ORAL | Status: DC
  Administered 2015-12-30 – 2015-12-31 (×2): 60 mg via ORAL
  Filled 2015-12-30 (×3): qty 1

## 2015-12-30 NOTE — Progress Notes (Signed)
Family Medicine Teaching Service Daily Progress Note Intern Pager: [phone]  Patient name: Juan Wiggins Medical record number: [id number] Date of birth: [birth year] Age: 65 y.o. Gender: male  Primary Care Provider: Helane Rima, MD Consultants: Ortho Code Status: FULL  Pt Overview and Major Events to Date:  Admit 11/5 11/5: s/p OR for R knee arthroscopy with irrigation + debridement 11/5 Started on Vanc   Assessment and Plan:   BHARAT ANTILLON is a 65 y.o. male presenting with right knee pain found to have a septic joint . PMH is significant for hypothyroidism, alcohol abuse, Hepatitis B, Hepatitis C,  HTN, GERD, chronic pain syndrome, hx of TKA (both in 2015), hx of R septic knee (06/2015), major depression, anxiety, BPH.   #Sepsis 2/2 to right knee joint s/p arthroscopy with irrigation and debridement 11/5 Presents to ED meeting sepsis criteria with painful swelling and warmth of right knee. Vitals notable for hypotension to 94/54. Received 3L NS with some improvement in hypotension.  Afebrile with elevated white count of 15.  Lactic acid normal at 1.50, elevated to 4.57 after surgery, now normalized 0.8. ESR normal at 6. CRP elevated to 8.2.  Code sepsis was called based on SIRS criteria, source of infection being infected right knee.  Symptoms had been worsening over the course of last 4 days.  No relief with Percoset at home.  Has had similar symptoms earlier this year (May 2017) at which time he had a septic joint +for Strep Group C and requiring a washout. Synovial right knee fluid +with abundant wbcs, predominantly PMNs, rare G+ cocci in clusters. UA with protein 30, neg for nitrite, neg for LE.  Urine microscopy with 0-5 squams but otherwise normal.  No trauma to the affected joint. In ED received Dilaudid and was started on Vancomycin and Cefepime. Tdap injection administered.  Orthopedic surgery consulted and was taken to the OR for arthrocentesis of the joint.  Procedure went well  without complications.   This AM satting well on RA, some R knee discomfort and left side lower back pain.  Is otherwise well and eating.   -XR Left hip Normal  - Continue Vancomycin (Day 2)  750 mg IV Q12 h  - Orthopedic recs  - c/s Infectious Disease, appreciate recs --> discontinue Cefepime, per ID (Dr. Baxter Flattery). Will also require prolonged antibiotic course.    -Synovial cell count + differentials with crystals, pending -Bcx , Ucx  -Morphine 2 mg IV Q2h prn  -Oxycodone IR 5-68m Q3 prn  -continuous pulse ox -Zofran 4 mg Q6 prn -bed rest -Vitals per unit routine  #History of drug and alcohol abuse.    Blood ethanol level negative.   States his last drink was 2.5 years ago and history of cocaine and marijuana >40 years ago.  UDS +for opiates, benzos and cocaine.   He did receive Dilaudid in the ED and possible Benzos for surgery.  -Place on CIWA protocol -monitor closely  #Back pain, likely MSK.  Has had surgery on L4-L5 vertebrae in the past by Ortho spine surgeon.  -Flexeril 5 mg this AM -Flexeril 5 mg Q8h prn -continue to monitor  #Lactic acidosis, resolved.  Initially with LA 1.50 in the ED.  Elevated to 4.57 after surgery. Gave 750cc L NS Bolus Possibly was hypotensive during surgery and on lactated ringers.  Normalized to 0.8. - IV NS @ 130 cc/hr until eating.  Is now eating and fluids NS _0  cc/hr.  -Stop trending LA  #Hypertension.  Hypotensive  on admission with BP of 94/54.  Improved s/p fluid boluses in ED.  Home medications include propranolol 60 mg daily.  BP meds held as he was hypotensive on admission.  -BP this AM 119/64 -restarted Propranolol 60 mg daily 11/6 -monitor  #Hepatitis B, chronic.  At home on Viread 300 mg daily.  PT elevated 16.  INR normal 1.27.  -Continue home Viread 300 mg daily -Monitor  #Hepatitis C, chronic.  At home on Sofosbuvir-Velpatasvir [name]).  -Continue home Epclusa 400-100 mg po tab daily -Monitor  #Hypothyroidism.  Stable.   At home on Levothyroxine.  Last TSH 13.8 (07/19/2015).  -Continue home Synthroid 200 mcg daily -repeat TSH 0.206 (low)  #difficulty sleeping . At home on Ambien 10 mg at bedtime.  - Ambien 76m PRN  #Depression . Stable.  At home on Lexapro and Remeron. Also on Ambien 144mPRN  -Continue Lexapro 20 mg daily at bedtime - Remeron  - Ambien 5 mg PRN   #BPH:  - continue home Rapaflo   FEN/GI: NPO for surgery > heart healthy, IV NS _0  cc/hr Prophylaxis: SCDs, will have to ask surgery regarding prophylaxis    Disposition: Continue to monitor in SDU  Subjective:  Patient appears well.  Complaining of some right knee discomfort from surgery and also complaining of lower back pain on the left side.  He is otherwise doing ok and slept well last night.    Objective: Temp:  [97.5 F (36.4 C)-100.1 F (37.8 C)] 99.1 F (37.3 C) (11/06 1300) Pulse Rate:  [66-76] 70 (11/06 1300) Resp:  [9-19] 16 (11/06 1300) BP: (95-120)/(56-67) 119/65 (11/06 1300) SpO2:  [21 %-99 %] 92 % (11/06 1300) Weight:  [195 lb 1.7 oz (88.5 kg)] 195 lb 1.7 oz (88.5 kg) (11/05 1703)   Physical Exam: General: [age]/o M, in NAD Eyes: EOMI, no conjunctival injection, no scleral icterus  ENTM: MMM, oropharynx clear Neck: normal, supple Cardiovascular: RRR, no MRG, 2+ DP pulses bilaterally  Respiratory: CTA B/L .  Normal effort of breathing Gastrointestinal: soft, NT, ND +bs MSK: R leg in sterile dressing with knee drain placed, no edema noted  Derm: warm, dry, well-perfused, no rashes noted Neuro: AAOx3, no focal deficits Psych: pleasant, mood appropriate  Laboratory:  Recent Labs Lab 12/29/15 1119 12/30/15 0432  WBC 15.0* 12.8*  HGB 14.0 12.5*  HCT 41.0 37.6*  PLT 230 192    Recent Labs Lab 12/29/15 1119 12/30/15 0432  NA 134* 136  K 4.3 4.5  CL 103 106  CO2 23 25  BUN 10 9  CREATININE 1.51* 1.40*  CALCIUM 8.4* 7.8*  PROT 6.4* 5.7*  BILITOT 1.8* 1.3*  ALKPHOS 130* 104  ALT 31 26  AST  51* 41  GLUCOSE 104* 121*   Imaging/Diagnostic Tests: Dg Hip Unilat With Pelvis 2-3 Views Left  Result Date: 12/30/2015 CLINICAL DATA:  Pain EXAM: DG HIP (WITH OR WITHOUT PELVIS) 2-3V LEFT COMPARISON:  None. FINDINGS: Frontal pelvis as well as frontal and lateral left hip images were obtained. There is no fracture or dislocation. There is slight symmetric narrowing of both hip joints. No erosive change or bony destruction. IMPRESSION: Slight symmetric narrowing both hip joints. No acute fracture or dislocation. Electronically Signed   By: WiLowella GripII M.D.   On: 12/30/2015 10:39   YaLovenia KimMD 12/30/2015, 4:11 PM PGY-1, [facility] pager: [phone] pages welcome

## 2015-12-30 NOTE — Consult Note (Signed)
[facility] for Infectious Disease       Reason for Consult: PJI    Referring Physician: Dr. Nori Riis  Active Problems:   Sepsis ([place])   Lactic acidosis   . aspirin  325 mg Oral BID  . escitalopram  20 mg Oral QHS  . HYDROmorphone  0.5 mg Intravenous Once  . levothyroxine  200 mcg Oral QAC breakfast  . mirtazapine  15 mg Oral QHS  . Sofosbuvir-Velpatasvir  1 tablet Oral Daily  . tamsulosin  0.4 mg Oral QPC supper  . tenofovir  300 mg Oral Daily  . vancomycin  750 mg Intravenous Q12H    Recommendations: Continue with vancomycin  Will need prolonged IV course Left hip xray  Assessment: He has a PJI s/p irrigation and debridement with gram stain GPC in clusters.   Antibiotics: vancomycin  HPI: Juan Wiggins is a 65 y.o. 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  Also a history of severe Group A strep infection earlier this year.  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  He has had some renal insufficiency and was on viread qod but creat ok now for daily viread.  He also has complained of left hip pain for about 1 week.    Review of Systems:  Constitutional: negative for fatigue Gastrointestinal: negative for diarrhea Musculoskeletal: negative for myalgias All other systems reviewed and are negative    Past Medical History:  Diagnosis Date  . Anxiety   . Arthritis    RHEUMATOID ARTHRITIS; OA LEFT KNEE  . Cancer (HCC)    MELANOMA REMOVED RT SHOULDER  . Depression   . GERD (gastroesophageal reflux disease)    PREVACID IF NEEDED  . Heart murmur   . Hemorrhoids   .  Hepatitis A    PT STATES TYPE OF HEPATITIS YOU GET FROM SHELLFISH  . Hepatitis B   . Hepatitis C   . Hypertension   . Hypothyroidism   . 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 Pancreatitis, alcoholic, acute   . Sleep apnea    CLAUSTROPHOBIC - COULD NOT TOLERATE CPAP MASK  . Tachycardia    PT STATES HIS HEART RATE USUALLY 100 OR MORE  . Thyroid disease     Social History  Substance Use Topics  . Smoking status: Former Smoker    Packs/day: 1.50    Years: 35.00    Types: Cigars    Quit date: 02/22/1998  . Smokeless tobacco: Never Used     Comment: used cigarettes 20 years ago  . Alcohol use No     Comment: " last alcohol use was in December 2015."    Family History  Problem Relation Age of Onset  . Hypertension Mother   . Other Mother     varicose veins  . Hypertension Sister     Allergies  Allergen Reactions  . Heparin Other (See Comments)    Low platelets (130s), no HIT testing performed, platelets recovered.  . Wellbutrin [Bupropion] Other (See Comments)    Hallucinations-started  on 150mg  daily dosage    Physical Exam: Constitutional: in no apparent distress  Vitals:   12/29/15 2334 12/30/15 0344  BP: 115/63 119/64  Pulse: 73 76  Resp: 14 18  Temp: 99.3 F (37.4 C) 99.6 F (37.6 C)   EYES: anicteric ENMT: no thrush Cardiovascular: Cor RRR Respiratory: CTAb; normal respiratory effort GI: Bowel sounds are normal, liver is not enlarged, spleen is not enlarged Musculoskeletal: no pedal edema noted, left knee wrapped, drain in place Skin: negatives: no rash Hematologic: no cervical lad  Lab Results  Component Value Date   WBC 12.8 (H) 12/30/2015   HGB 12.5 (L) 12/30/2015   HCT 37.6 (L) 12/30/2015   MCV 100.0 12/30/2015   PLT 192 12/30/2015    Lab Results  Component Value Date   CREATININE 1.40 (H) 12/30/2015   BUN 9 12/30/2015   NA 136 12/30/2015   K 4.5 12/30/2015   CL 106 12/30/2015   CO2 25 12/30/2015     Lab Results  Component Value Date   ALT 26 12/30/2015   AST 41 12/30/2015   ALKPHOS 104 12/30/2015     Microbiology: Recent Results (from the past 240 hour(s))  Body fluid culture     Status: None (Preliminary result)   Collection Time: 12/29/15 12:30 PM  Result Value Ref Range Status   Specimen Description SYNOVIAL RIGHT KNEE  Final   Special Requests NONE  Final   Gram Stain   Final    ABUNDANT WBC PRESENT, PREDOMINANTLY PMN RARE GRAM POSITIVE COCCI IN CLUSTERS    Culture PENDING  Incomplete   Report Status PENDING  Incomplete  MRSA PCR Screening     Status: None   Collection Time: 12/29/15  4:00 PM  Result Value Ref Range Status   MRSA by PCR NEGATIVE NEGATIVE Final    Comment:        The GeneXpert MRSA Assay (FDA approved for NASAL specimens only), is one component of a comprehensive MRSA colonization surveillance program. It is not intended to diagnose MRSA infection nor to guide or monitor treatment for MRSA infections.     Scharlene Gloss, [facility] for Infectious Disease [place] [email or web address] [phone] pager  [phone] cell 12/30/2015, 8:55 AM

## 2015-12-30 NOTE — Progress Notes (Signed)
Subjective: 1 Day Post-Op Procedure(s) (LRB): ARTHROSCOPIC IRRIGATION AND DEBRIEDMENT RIGHT  KNEE (Right) Patient reports pain as 3 on 0-10 scale. Better this morning. Drain in place. Temp.99.WBC is 12.8 and is less than pre-Op. Will keep drain in place and wait for final Culture report. He has some lefy low back pain. He has a history of Hepatitis B&C.Will start Aspirin 325mg m,twice a day.   Objective: Vital signs in last 24 hours: Temp:  [97.5 F (36.4 C)-99.6 F (37.6 C)] 99.6 F (37.6 C) (11/06 0344) Pulse Rate:  [63-76] 76 (11/06 0344) Resp:  [9-19] 18 (11/06 0344) BP: (81-119)/(46-81) 119/64 (11/06 0344) SpO2:  [21 %-99 %] 21 % (11/06 0344) Weight:  [88.5 kg (195 lb 1.7 oz)] 88.5 kg (195 lb 1.7 oz) (11/05 1703)  Intake/Output from previous day: 11/05 0701 - 11/06 0700 In: 5124.8 [P.O.:120; I.V.:4964.8] Out: 1170 [Urine:1150; Drains:15; Blood:5] Intake/Output this shift: No intake/output data recorded.   Recent Labs  12/29/15 1119 12/30/15 0432  HGB 14.0 12.5*    Recent Labs  12/29/15 1119 12/30/15 0432  WBC 15.0* 12.8*  RBC 4.19* 3.76*  HCT 41.0 37.6*  PLT 230 192    Recent Labs  12/29/15 1119 12/30/15 0432  NA 134* 136  K 4.3 4.5  CL 103 106  CO2 23 25  BUN 10 9  CREATININE 1.51* 1.40*  GLUCOSE 104* 121*  CALCIUM 8.4* 7.8*    Recent Labs  12/29/15 1119  INR 1.27    Dorsiflexion/Plantar flexion intact  Assessment/Plan: 1 Day Post-Op Procedure(s) (LRB): ARTHROSCOPIC IRRIGATION AND DEBRIEDMENT RIGHT  KNEE (Right) Up with therapy.Pull drain tomorrow. Transfer to 5N today.  Baden Betsch A 12/30/2015, 7:25 AM

## 2015-12-30 NOTE — Progress Notes (Signed)
[place] pt for [facility] this hospital admission  [facility] will provide [facility] and Home infusion Pharmacy services for home IV ABX as ordered for DC to home. [facility] hospital team will follow pt until DC to ensure [facility] and home infusion needs are met.   If patient discharges after hours, please call [phone].   Larry Sierras 12/30/2015, 1:14 PM

## 2015-12-30 NOTE — Care Management Note (Addendum)
Case Management Note  Patient Details  Name: Juan Wiggins MRN: [id number] Date of Birth: 1951-02-07  Subjective/Objective:   S/p   Arthroscopic incision and drainage of right knee. , Patient states he has crutches at home.  He lives with his father and his father care taker. He states he has a pcp, he has medication coverage and he has transportation at dc.  PTA indep.  Patient will need long term iv abx per infectious disease, patient states he would like [facility] for [facility], NCM made referral to Butch Penny  With [facility] for IV ABX.   NCM will cont to follow for dc needs.          Action/Plan:   Expected Discharge Date:                  Expected Discharge Plan:  Home/Self Care  In-House Referral:     Discharge planning Services  CM Consult  Post Acute Care Choice:   Home Health Choice offered to:   Patient  DME Arranged:    DME Agency:     HH Arranged:   HHRN, IV Antibiotics HH Agency:   [facility]  Status of Service:  In process, will continue to follow  If discussed at Long Length of Stay Meetings, dates discussed:    Additional Comments:  Zenon Mayo, RN 12/30/2015, 10:57 AM

## 2015-12-31 ENCOUNTER — Ambulatory Visit: Payer: Self-pay

## 2015-12-31 ENCOUNTER — Encounter (HOSPITAL_COMMUNITY): Payer: Self-pay | Admitting: *Deleted

## 2015-12-31 DIAGNOSIS — B169 Acute hepatitis B without delta-agent and without hepatic coma: Secondary | ICD-10-CM

## 2015-12-31 DIAGNOSIS — B9561 Methicillin susceptible Staphylococcus aureus infection as the cause of diseases classified elsewhere: Secondary | ICD-10-CM

## 2015-12-31 DIAGNOSIS — M00061 Staphylococcal arthritis, right knee: Principal | ICD-10-CM

## 2015-12-31 LAB — BODY FLUID CULTURE

## 2015-12-31 LAB — BASIC METABOLIC PANEL
Anion gap: 4 — ABNORMAL LOW (ref 5–15)
BUN: 10 mg/dL (ref 6–20)
CALCIUM: 8.1 mg/dL — AB (ref 8.9–10.3)
CHLORIDE: 108 mmol/L (ref 101–111)
CO2: 23 mmol/L (ref 22–32)
CREATININE: 1.34 mg/dL — AB (ref 0.61–1.24)
GFR calc Af Amer: >60 mL/min (ref >60)
GFR calc non Af Amer: 55 mL/min — ABNORMAL LOW (ref >60)
GLUCOSE: 121 mg/dL — AB (ref 65–99)
Potassium: 4.1 mmol/L (ref 3.5–5.1)
Sodium: 135 mmol/L (ref 135–145)

## 2015-12-31 LAB — CBC
HEMATOCRIT: 37.8 % — AB (ref 39.0–52.0)
HEMOGLOBIN: 12.7 g/dL — AB (ref 13.0–17.0)
MCH: 33.2 pg (ref 26.0–34.0)
MCHC: 33.6 g/dL (ref 30.0–36.0)
MCV: 98.7 fL (ref 78.0–100.0)
Platelets: 225 10*3/uL (ref 150–400)
RBC: 3.83 MIL/uL — ABNORMAL LOW (ref 4.22–5.81)
RDW: 16.5 % — AB (ref 11.5–15.5)
WBC: 12 10*3/uL — ABNORMAL HIGH (ref 4.0–10.5)

## 2015-12-31 MED ORDER — SODIUM CHLORIDE 0.9% FLUSH
10.0000 mL | INTRAVENOUS | Status: DC | PRN
  Administered 2016-01-02 – 2016-01-04 (×2): 10 mL
  Filled 2015-12-31 (×2): qty 40

## 2015-12-31 MED ORDER — CEFAZOLIN SODIUM-DEXTROSE 2-4 GM/100ML-% IV SOLN
2.0000 g | Freq: Three times a day (TID) | INTRAVENOUS | Status: DC
  Administered 2015-12-31 – 2016-01-06 (×18): 2 g via INTRAVENOUS
  Filled 2015-12-31 (×22): qty 100

## 2015-12-31 NOTE — Progress Notes (Signed)
Peripherally Inserted Central Catheter/Midline Placement  The IV Nurse has discussed with the patient and/or persons authorized to consent for the patient, the purpose of this procedure and the potential benefits and risks involved with this procedure.  The benefits include less needle sticks, lab draws from the catheter, and the patient may be discharged home with the catheter. Risks include, but not limited to, infection, bleeding, blood clot (thrombus formation), and puncture of an artery; nerve damage and irregular heartbeat and possibility to perform a PICC exchange if needed/ordered by physician.  Alternatives to this procedure were also discussed.  Bard Power PICC patient education guide, fact sheet on infection prevention and patient information card has been provided to patient /or left at bedside.    PICC/Midline Placement Documentation        Juan Wiggins, [name] 12/31/2015, 4:09 PM

## 2015-12-31 NOTE — Progress Notes (Signed)
Subjective: 2 Days Post-Op Procedure(s) (LRB): ARTHROSCOPIC IRRIGATION AND DEBRIEDMENT RIGHT  KNEE (Right) Patient reports pain as 5 on 0-10 scale.    Objective: Vital signs in last 24 hours: Temp:  [98.7 F (37.1 C)-100.1 F (37.8 C)] 99.1 F (37.3 C) (11/07 0520) Pulse Rate:  [68-72] 72 (11/07 0520) Resp:  [16] 16 (11/07 0520) BP: (112-130)/(61-69) 130/61 (11/07 0520) SpO2:  [91 %-96 %] 92 % (11/07 0520)  Intake/Output from previous day: 11/06 0701 - 11/07 0700 In: 390 [P.O.:240; I.V.:150] Out: 865 [Urine:850; Drains:15] Intake/Output this shift: No intake/output data recorded.   Recent Labs  12/29/15 1119 12/30/15 0432  HGB 14.0 12.5*    Recent Labs  12/29/15 1119 12/30/15 0432  WBC 15.0* 12.8*  RBC 4.19* 3.76*  HCT 41.0 37.6*  PLT 230 192    Recent Labs  12/29/15 1119 12/30/15 0432  NA 134* 136  K 4.3 4.5  CL 103 106  CO2 23 25  BUN 10 9  CREATININE 1.51* 1.40*  GLUCOSE 104* 121*  CALCIUM 8.4* 7.8*    Recent Labs  12/29/15 1119  INR 1.27    Dorsiflexion/Plantar flexion intact Compartment soft  Assessment/Plan: 2 Days Post-Op Procedure(s) (LRB): ARTHROSCOPIC IRRIGATION AND DEBRIEDMENT RIGHT  KNEE (Right) Up with therapy  Pilar Corrales A 12/31/2015, 7:24 AM

## 2015-12-31 NOTE — Progress Notes (Signed)
  Progress Note   Date: 12/31/2015  Patient Name: Juan Wiggins        MRN#: [id number]  Septic joint is due to staphylococcal aureus.Dorcas Mcmurray, MD

## 2015-12-31 NOTE — Evaluation (Signed)
Physical Therapy Evaluation Patient Details Name: Juan Wiggins MRN: [id number] DOB: 12/26/1950 Today's Date: 12/31/2015   History of Present Illness  65 y.o. male presenting with right knee pain found to have a septic joint . PMH is significant for hypothyroidism, alcohol abuse, Hepatitis B, Hepatitis C,  HTN, GERD, chronic pain syndrome, hx of TKA (both in 2015), hx of R septic knee (06/2015), major depression, anxiety, BPH.  s/p arthroscopy with i and d on 12/29/15  Clinical Impression  Pt presents independent with gait and mobility, has assistance to help at home, no further acute PT needs identified at this time.    Follow Up Recommendations No PT follow up    Equipment Recommendations  None recommended by PT    Recommendations for Other Services       Precautions / Restrictions Precautions Precautions: Fall Restrictions Weight Bearing Restrictions: No      Mobility  Bed Mobility Overal bed mobility: Independent                Transfers Overall transfer level: Independent                  Ambulation/Gait Ambulation/Gait assistance: Modified independent (Device/Increase time) Ambulation Distance (Feet): 100 Feet Assistive device: None       General Gait Details: antalgic gait due to knee pain, no LOB  Stairs            Wheelchair Mobility    Modified [facility] (Stroke Patients Only)       Balance Overall balance assessment: No apparent balance deficits (not formally assessed)                                           Pertinent Vitals/Pain Pain Assessment: Faces Faces Pain Scale: Hurts little more Pain Location: Rt knee Pain Descriptors / Indicators: Aching Pain Intervention(s): Limited activity within patient's tolerance;Monitored during session    Home Living Family/patient expects to be discharged to:: Private residence Living Arrangements: Parent Available Help at Discharge: Family;Available 24 hours/day Type  of Home: House Home Access: Stairs to enter Entrance Stairs-Rails: Can reach both Entrance Stairs-Number of Steps: 2 Home Layout: Able to live on main level with bedroom/bathroom Home Equipment: Walker - 2 wheels;Bedside commode;Cane - single point;Crutches;Shower seat      Prior Function Level of Independence: Independent               Hand Dominance        Extremity/Trunk Assessment   Upper Extremity Assessment: Overall WFL for tasks assessed           Lower Extremity Assessment: RLE deficits/detail RLE Deficits / Details: grossly 3/5    Cervical / Trunk Assessment: Normal  Communication   Communication: No difficulties  Cognition Arousal/Alertness: Awake/alert Behavior During Therapy: WFL for tasks assessed/performed Overall Cognitive Status: Within Functional Limits for tasks assessed                      General Comments      Exercises     Assessment/Plan    PT Assessment Patent does not need any further PT services  PT Problem List            PT Treatment Interventions      PT Goals (Current goals can be found in the Care Plan section)  Acute Rehab PT Goals PT Goal Formulation: All  assessment and education complete, DC therapy    Frequency     Barriers to discharge        Co-evaluation               End of Session   Activity Tolerance: Patient tolerated treatment well Patient left: in bed;with call bell/phone within reach;with family/visitor present Nurse Communication: Mobility status         Time: [id number] PT Time Calculation (min) (ACUTE ONLY): 11 min   Charges:   PT Evaluation $PT Eval Low Complexity: 1 Procedure     PT G Codes:        DONAWERTH,KAREN 2016-01-29, 11:16 AM

## 2015-12-31 NOTE — Progress Notes (Signed)
Family Medicine Teaching Service Daily Progress Note Intern Pager: [phone]  Patient name: Juan Wiggins Medical record number: [id number] Date of birth: 02-08-1951 Age: 65 y.o. Gender: male  Primary Care Provider: Helane Rima, MD Consultants: Ortho Code Status: FULL  Pt Overview and Major Events to Date:  Admit 11/5  11/5: s/p OR for R knee arthroscopy with irrigation + debridement  11/5 Started on Vanc   Assessment and Plan:   Juan Wiggins is a 65 y.o. male presenting with right knee pain found to have a septic joint . PMH is significant for hypothyroidism, alcohol abuse, Hepatitis B, Hepatitis C,  HTN, GERD, chronic pain syndrome, hx of TKA (both in 2015), hx of R septic knee (06/2015), major depression, anxiety, BPH.   #Sepsis 2/2 to right knee joint s/p arthroscopy with irrigation and debridement 11/5 Presents to ED meeting sepsis criteria with painful swelling and warmth of right knee. Vitals notable for hypotension to 94/54.   Symptoms had been worsening over the course of last 4 days.  Orthopedic surgery consulted and was taken to the OR for arthrocentesis of the joint.  Procedure went well without complications.   This AM satting well on RA, some R knee discomfort and left side lower back pain.  Is otherwise well and eating.  -Synovial right knee fluid cx + with Staph aureus - Orthopedic recs.  S/p Right knee drain removed 11/7.  - Continue Vancomycin (Day 3)  750 mg IV Q12 h  - c/s Infectious Disease, appreciate recs -->  Will also require prolonged antibiotic course via PICC line.  -will await sensitivities before deciding abx -PICC line ordered -Synovial cell count + differentials with crystals, pending -Bcx , Ucx insignificant  -Morphine 2 mg IV Q2h prn  -Oxycodone IR 5-10mg  Q3 prn  -continuous pulse ox -Zofran 4 mg Q6 prn -PT/OT to see -bed rest -Vitals per unit routine  #History of drug and alcohol abuse.    Blood ethanol level negative.   States his last drink  was 2.5 years ago and history of cocaine and marijuana >40 years ago.  UDS +for opiates, benzos and cocaine.   He did receive Dilaudid in the ED and possible Benzos for surgery.  -Place on CIWA protocol.  Scores have remained low: 2, 2, 1, 1, 2.  -monitor closely  #Back pain, likely MSK.  Has had surgery on L4-L5 vertebrae in the past by Ortho spine surgeon.  -Flexeril 5 mg this AM -Flexeril 5 mg Q8h prn -continue to monitor  #Lactic acidosis, resolved.  Initially with LA 1.50 in the ED.  Elevated to 4.57 after surgery. Gave 750cc L NS Bolus Possibly was hypotensive during surgery and on lactated ringers.  Normalized to 0.8. - IV NS @ 130 cc/hr until eating.  Is now eating and fluids NS @75  cc/hr.  -Stop trending LA  #Hypertension.  Hypotensive on admission with BP of 94/54.  Improved s/p fluid boluses in ED.  Home medications include propranolol 60 mg daily.  Per patient, he was previously on both Lisinopril and Propranolol by his PCP.  She discontinued his Lisinopril but he is unsure of the reason (patient denied cough being the reason).  Possible other reason he may be on Propranolol is for his known anxiety.  States he has been on it for many years.    -BP this AM 117/63 -Hold Propranolol 60 mg daily in setting of his cocaine use - and monitor BPs -Consider adding Norvasc if an anti-hypertensive is needed  #  Hepatitis B, chronic.  At home on Viread 300 mg daily.  PT elevated 16.  INR normal 1.27.  -Continue home Viread 300 mg daily -Monitor  #Hepatitis C, chronic.  At home on Sofosbuvir-Velpatasvir [name]).  -Continue home Epclusa 400-100 mg po tab daily -Monitor  #Hypothyroidism.  Stable.  At home on Levothyroxine.  Last TSH 13.8 (07/19/2015).  -Continue home Synthroid 200 mcg daily -repeat TSH 0.206 (low)  #difficulty sleeping . At home on Ambien 10 mg at bedtime.  - Ambien 5mg  PRN  #Depression . Stable.  At home on Lexapro and Remeron. Also on Ambien 10mg  PRN   -Continue Lexapro 20 mg daily at bedtime - Remeron  - Ambien 5 mg PRN   #BPH:  - continue home Rapaflo   FEN/GI: NPO for surgery > heart healthy, IV NS @130  cc/hr Prophylaxis: SCDs, will have to ask surgery regarding prophylaxis    Disposition: Dispo pending clinical improvement   Subjective:  Patient appears well.  Complaining of some right knee discomfort from surgery but has been improving.  Feels as though his pain is well controlled.  No issues at current time.   Objective: Temp:  [98.7 F (37.1 C)-100.1 F (37.8 C)] 99.1 F (37.3 C) (11/07 0520) Pulse Rate:  [68-74] 74 (11/07 0821) Resp:  [16] 16 (11/07 0520) BP: (112-130)/(61-69) 117/63 (11/07 0821) SpO2:  [91 %-96 %] 92 % (11/07 0520)   Physical Exam: General: 65 y/o M, in NAD Eyes: EOMI, no conjunctival injection ENTM: MMM, oropharynx clear Neck: normal, supple Cardiovascular: RRR, no MRG Respiratory: CTA B/L .  Normal effort of breathing on RA Gastrointestinal: soft, NT, ND +bs MSK: R knee with sterile dressing over it.  Site is clean, dry and intact.  Drain has been removed by Ortho.  Derm: warm, dry, well-perfused, no rashes noted Neuro: AAOx3, no focal deficits. Sensation intact over bilateral LE.  Psych: pleasant, mood appropriate  Laboratory:  Recent Labs Lab 12/29/15 1119 12/30/15 0432 12/31/15 0758  WBC 15.0* 12.8* 12.0*  HGB 14.0 12.5* 12.7*  HCT 41.0 37.6* 37.8*  PLT 230 192 225    Recent Labs Lab 12/29/15 1119 12/30/15 0432 12/31/15 0758  NA 134* 136 135  K 4.3 4.5 4.1  CL 103 106 108  CO2 23 25 23   BUN 10 9 10   CREATININE 1.51* 1.40* 1.34*  CALCIUM 8.4* 7.8* 8.1*  PROT 6.4* 5.7*  --   BILITOT 1.8* 1.3*  --   ALKPHOS 130* 104  --   ALT 31 26  --   AST 51* 41  --   GLUCOSE 104* 121* 121*   Imaging/Diagnostic Tests: No results found. Juan Kim, MD 12/31/2015, 12:36 PM PGY-1, [facility] Intern pager: [phone], text pages welcome

## 2015-12-31 NOTE — Progress Notes (Signed)
[facility] for Infectious Disease   Reason for visit: Follow up on PJI  Interval History: growing Staph aureus; feels better; hip feels better after muscle relaxant; tolerating mediactions  Physical Exam: Constitutional:  Vitals:   12/31/15 0520 12/31/15 0821  BP: 130/61 117/63  Pulse: 72 74  Resp: 16   Temp: 99.1 F (37.3 C)    patient appears in NAD Respiratory: Normal respiratory effort; CTA B Cardiovascular: RRR GI: soft  Review of Systems: Constitutional: negative for fevers and chills Integument/breast: negative for rash  Lab Results  Component Value Date   WBC 12.0 (H) 12/31/2015   HGB 12.7 (L) 12/31/2015   HCT 37.8 (L) 12/31/2015   MCV 98.7 12/31/2015   PLT 225 12/31/2015    Lab Results  Component Value Date   CREATININE 1.34 (H) 12/31/2015   BUN 10 12/31/2015   NA 135 12/31/2015   K 4.1 12/31/2015   CL 108 12/31/2015   CO2 23 12/31/2015    Lab Results  Component Value Date   ALT 26 12/30/2015   AST 41 12/30/2015   ALKPHOS 104 12/30/2015     Microbiology: Recent Results (from the past 240 hour(s))  Blood culture (routine x 2)     Status: None (Preliminary result)   Collection Time: 12/29/15 11:30 AM  Result Value Ref Range Status   Specimen Description BLOOD LEFT ARM  Final   Special Requests BOTTLES DRAWN AEROBIC AND ANAEROBIC 5CC  Final   Culture NO GROWTH 1 DAY  Final   Report Status PENDING  Incomplete  Blood culture (routine x 2)     Status: None (Preliminary result)   Collection Time: 12/29/15 11:36 AM  Result Value Ref Range Status   Specimen Description BLOOD RIGHT ARM  Final   Special Requests BOTTLES DRAWN AEROBIC AND ANAEROBIC 5CC  Final   Culture NO GROWTH 1 DAY  Final   Report Status PENDING  Incomplete  Body fluid culture     Status: None (Preliminary result)   Collection Time: 12/29/15 12:30 PM  Result Value Ref Range Status   Specimen Description SYNOVIAL RIGHT KNEE  Final   Special Requests NONE  Final   Gram  Stain   Final    ABUNDANT WBC PRESENT, PREDOMINANTLY PMN RARE GRAM POSITIVE COCCI IN CLUSTERS    Culture   Final    FEW STAPHYLOCOCCUS AUREUS SUSCEPTIBILITIES TO FOLLOW    Report Status PENDING  Incomplete  Urine culture     Status: Abnormal   Collection Time: 12/29/15  1:15 PM  Result Value Ref Range Status   Specimen Description URINE, RANDOM  Final   Special Requests NONE  Final   Culture <10,000 COLONIES/mL INSIGNIFICANT GROWTH (A)  Final   Report Status 12/30/2015 FINAL  Final  MRSA PCR Screening     Status: None   Collection Time: 12/29/15  4:00 PM  Result Value Ref Range Status   MRSA by PCR NEGATIVE NEGATIVE Final    Comment:        The GeneXpert MRSA Assay (FDA approved for NASAL specimens only), is one component of a comprehensive MRSA colonization surveillance program. It is not intended to diagnose MRSA infection nor to guide or monitor treatment for MRSA infections.     Impression/Plan:  1. PJI s/p irrigation and debridement - on vancomycin.  Will need 6 weeks IV through December 17th followed by oral therapy.   Will narrow to cefazolin 2 grams three times a day if sensitive Antibiotics per home  health protocol Leave picc line in until seen by ID  2. Hepatitis C - continuing treatment one more month  3.  Hepatitis B - on tenofovir with reactivation on hepatitis C treatment.

## 2016-01-01 ENCOUNTER — Encounter (HOSPITAL_COMMUNITY): Payer: Self-pay | Admitting: Radiology

## 2016-01-01 ENCOUNTER — Inpatient Hospital Stay (HOSPITAL_COMMUNITY): Payer: 59

## 2016-01-01 DIAGNOSIS — R1012 Left upper quadrant pain: Secondary | ICD-10-CM

## 2016-01-01 DIAGNOSIS — F1410 Cocaine abuse, uncomplicated: Secondary | ICD-10-CM

## 2016-01-01 LAB — CBC
HCT: 37 % — ABNORMAL LOW (ref 39.0–52.0)
HEMOGLOBIN: 12.7 g/dL — AB (ref 13.0–17.0)
MCH: 33.3 pg (ref 26.0–34.0)
MCHC: 34.3 g/dL (ref 30.0–36.0)
MCV: 97.1 fL (ref 78.0–100.0)
PLATELETS: 268 10*3/uL (ref 150–400)
RBC: 3.81 MIL/uL — AB (ref 4.22–5.81)
RDW: 15.9 % — ABNORMAL HIGH (ref 11.5–15.5)
WBC: 12.3 10*3/uL — AB (ref 4.0–10.5)

## 2016-01-01 LAB — BASIC METABOLIC PANEL
ANION GAP: 5 (ref 5–15)
BUN: 10 mg/dL (ref 6–20)
CHLORIDE: 107 mmol/L (ref 101–111)
CO2: 23 mmol/L (ref 22–32)
CREATININE: 1.32 mg/dL — AB (ref 0.61–1.24)
Calcium: 8.2 mg/dL — ABNORMAL LOW (ref 8.9–10.3)
GFR calc non Af Amer: 56 mL/min — ABNORMAL LOW (ref >60)
Glucose, Bld: 91 mg/dL (ref 65–99)
POTASSIUM: 3.8 mmol/L (ref 3.5–5.1)
SODIUM: 135 mmol/L (ref 135–145)

## 2016-01-01 MED ORDER — IOPAMIDOL (ISOVUE-300) INJECTION 61%
INTRAVENOUS | Status: AC
  Administered 2016-01-01: 100 mL
  Filled 2016-01-01: qty 100

## 2016-01-01 MED ORDER — IOPAMIDOL (ISOVUE-300) INJECTION 61%
INTRAVENOUS | Status: AC
  Administered 2016-01-01: 30 mL
  Filled 2016-01-01: qty 30

## 2016-01-01 NOTE — Progress Notes (Signed)
[facility]   Reason for visit: Follow up on PJI  Interval History: growing Staph aureus; feels better; hip feels better after muscle relaxant; tolerating mediactions  Physical Exam: Constitutional:  Vitals:   12/31/15 2059 01/01/16 0436  BP: 122/65 131/62  Pulse: 66 72  Resp: 18 16  Temp: 99.2 F (37.3 C) 99.3 F (37.4 C)   patient appears in NAD Respiratory: Normal respiratory effort; CTA B Cardiovascular: RRR GI: soft  Review of Systems: Constitutional: negative for fevers and chills Integument/breast: negative for rash  Lab Results  Component Value Date   WBC 12.3 (H) 01/01/2016   HGB 12.7 (L) 01/01/2016   HCT 37.0 (L) 01/01/2016   MCV 97.1 01/01/2016   PLT 268 01/01/2016    Lab Results  Component Value Date   CREATININE 1.32 (H) 01/01/2016   BUN 10 01/01/2016   NA 135 01/01/2016   K 3.8 01/01/2016   CL 107 01/01/2016   CO2 23 01/01/2016    Lab Results  Component Value Date   ALT 26 12/30/2015   AST 41 12/30/2015   ALKPHOS 104 12/30/2015     Microbiology: Recent Results (from the past 240 hour(s))  Blood culture (routine x 2)     Status: None (Preliminary result)   Collection Time: 12/29/15 11:30 AM  Result Value Ref Range Status   Specimen Description BLOOD LEFT ARM  Final   Special Requests BOTTLES DRAWN AEROBIC AND ANAEROBIC 5CC  Final   Culture NO GROWTH 3 DAYS  Final   Report Status PENDING  Incomplete  Blood culture (routine x 2)     Status: None (Preliminary result)   Collection Time: 12/29/15 11:36 AM  Result Value Ref Range Status   Specimen Description BLOOD RIGHT ARM  Final   Special Requests BOTTLES DRAWN AEROBIC AND ANAEROBIC 5CC  Final   Culture NO GROWTH 3 DAYS  Final   Report Status PENDING  Incomplete  Body fluid culture     Status: None   Collection Time: 12/29/15 12:30 PM  Result Value Ref Range Status   Specimen Description SYNOVIAL RIGHT KNEE  Final   Special Requests NONE  Final   Gram  Stain   Final    ABUNDANT WBC PRESENT, PREDOMINANTLY PMN RARE GRAM POSITIVE COCCI IN CLUSTERS    Culture FEW STAPHYLOCOCCUS AUREUS  Final   Report Status 12/31/2015 FINAL  Final   Organism ID, Bacteria STAPHYLOCOCCUS AUREUS  Final      Susceptibility   Staphylococcus aureus - MIC*    CIPROFLOXACIN <=0.5 SENSITIVE Sensitive     ERYTHROMYCIN <=0.25 SENSITIVE Sensitive     GENTAMICIN <=0.5 SENSITIVE Sensitive     OXACILLIN 0.5 SENSITIVE Sensitive     TETRACYCLINE <=1 SENSITIVE Sensitive     VANCOMYCIN <=0.5 SENSITIVE Sensitive     TRIMETH/SULFA <=10 SENSITIVE Sensitive     CLINDAMYCIN <=0.25 SENSITIVE Sensitive     RIFAMPIN <=0.5 SENSITIVE Sensitive     Inducible Clindamycin NEGATIVE Sensitive     * FEW STAPHYLOCOCCUS AUREUS  Urine culture     Status: Abnormal   Collection Time: 12/29/15  1:15 PM  Result Value Ref Range Status   Specimen Description URINE, RANDOM  Final   Special Requests NONE  Final   Culture <10,000 COLONIES/mL INSIGNIFICANT GROWTH (A)  Final   Report Status 12/30/2015 FINAL  Final  MRSA PCR Screening     Status: None   Collection Time: 12/29/15  4:00 PM  Result Value Ref Range  Status   MRSA by PCR NEGATIVE NEGATIVE Final    Comment:        The GeneXpert MRSA Assay (FDA approved for NASAL specimens only), is one component of a comprehensive MRSA colonization surveillance program. It is not intended to diagnose MRSA infection nor to guide or monitor treatment for MRSA infections.     Impression/Plan:  1. PJI s/p irrigation and debridement - on vancomycin.  Will need 6 weeks IV through December 17th followed by oral therapy.   On cefazolin 2 grams three times a day  I was alerted by CM that he has a positive drug screen this admission for cocaine and known history of substance abuse.  I therefore talked to him and Case Management about placement and he is agreeable.  I do not feel a picc line at home is safe.    2. Hepatitis C - continuing treatment one  more month  3.  Hepatitis B - on tenofovir with reactivation on hepatitis C treatment.    We will arrange follow up in our office.   I will sign off, call with questions.

## 2016-01-01 NOTE — Progress Notes (Signed)
Family Medicine Teaching Service Daily Progress Note Intern Pager: [phone]  Patient name: Juan Wiggins Medical record number: [id number] Date of birth: 18-Oct-1950 Age: 65 y.o. Gender: male  Primary Care Provider: Helane Rima, MD Consultants: Orthopedics Code Status: FULL  Pt Overview and Major Events to Date:  11/5 admit to FPTS, Right knee arthroscopy with irrigation and debridement  11/5 Started on Vancomycin  Assessment and Plan: Juan Tierrablanca Jonesis a 65 y.o.malepresenting with right knee pain found to have a septic joint. PMH is significant for hypothyroidism, alcohol abuse, Hepatitis B, Hepatitis C, HTN, GERD, chronic pain syndrome, hx of TKA (both in 2015), hx of R septic knee (06/2015), major depression, anxiety, BPH.   #Sepsis 2/2 to right knee joint s/p arthroscopy with irrigation and debridement:   Patient presented meeting sepsis criteria secondary to right knee joint infection, taken to the OR for arthrocentesisof the joint. Now day #3 s/p debridement.Plan is for patient to go for another debridement with orthopedics tomorrow 11/9. Synovial right knee fluid cx with pan-sensitive Staph aureus.  - Continue cefazolin 2g TID per ID recs, which will continue for 6 weeks (through 12/17). - PICC in place. Patient to go to SNF after discharge due to history of drug abuse, poor candidate for home with PICC. - Pain control with Morphine 2 mg IV Q2h prn, Oxycodone IR 5-10mg  Q3 prn  - Nausea control with Zofran 4 mg Q6 prn    #History of drug and alcohol abuse.    Blood ethanol level negative.   States his last drink was 2.5 years ago and history of cocaine and marijuana >40 years ago.  UDS +for opiates, benzos and cocaine.   He did receive Dilaudid in the ED and possible Benzos for surgery.  - Place on CIWA protocol.  Scores were zero overnight.  - monitor closely  #Back pain, likely MSK.  Has had surgery on L4-L5 vertebrae in the past by Ortho spine surgeon.  -  Hold flexeril  while patient receiving opioids - continue to monitor - CT abdomen ordered for back pain by ortho, will follow up results  #Lactic acidosis, resolved. Lactate elevated at admission and after surgery, now normalized to 0.8 with fluids -Stop trending LA  #Hypertension. Hypotensive on admission with BP of 94/54, however fluids now resolved as sepsis resolved.  BP has been controlled off of propanolol which was stopped in setting of cocaine use.      - BP this AM controlled at 131/62 - Hold Propranolol 60 mg daily in setting of his cocaine use.  - and monitor BPs -Consider adding Norvasc if an anti-hypertensive is needed  #Hepatitis B, chronic.At home on Viread 300 mg daily. PT elevated 16. INR normal 1.27.  -Continue home Viread 300 mg daily -Monitor  #Hepatitis C, chronic. At home on Sofosbuvir-Velpatasvir Juan Wiggins).  -Continue home Epclusa 400-100 mg po tab daily -Monitor  #Hypothyroidism. Stable. At home on Levothyroxine. Last TSH 13.8 (07/19/2015).  -Continue home Synthroid 200 mcg daily -repeat TSH 0.206 (low)  #difficulty sleeping. At home on Ambien 10 mg at bedtime.  - Ambien 5mg  PRN  #Depression . Stable. At home on Lexapro and Remeron. Also on Ambien 10mg  PRN  -Continue Lexapro 20 mg daily at bedtime - Remeron   #BPH: - continue home Rapaflo   FEN/GI:  heart healthy, IV NS @ 75 cc/hr Prophylaxis: SCDs, will have to ask surgery regarding prophylaxis   Disposition: Plan for SNF for IV antibiotics  Subjective:  Patient had no acute events  overnight. States he had some difficulty sleeping. Notes he has 5/10 knee pain. Complains of lumbar back pain as well. States he wishes he was not on a restricted healthy diet while hospitalized.  Overall, doing well.     Objective: Temp:  [98.9 F (37.2 C)-99.3 F (37.4 C)] 99.3 F (37.4 C) (11/08 0436) Pulse Rate:  [62-72] 72 (11/08 0436) Resp:  [16-18] 16 (11/08 0436) BP: (121-131)/(62-68) 131/62 (11/08  0436) SpO2:  [94 %-96 %] 94 % (11/08 0436) Physical Exam: General: NAD, rests comfortably in bed, pleasant and appropriate Cardiovascular: RRR, no m/r/g Respiratory: CTA bil no W/R/R Abdomen: soft and nontender, nondistended Extremities: +banadage in place over right knee, clean and dry, +minimal tenderness to palpation  Laboratory:  Recent Labs Lab 12/30/15 0432 12/31/15 0758 01/01/16 0500  WBC 12.8* 12.0* 12.3*  HGB 12.5* 12.7* 12.7*  HCT 37.6* 37.8* 37.0*  PLT 192 225 268    Recent Labs Lab 12/29/15 1119 12/30/15 0432 12/31/15 0758 01/01/16 0500  NA 134* 136 135 135  K 4.3 4.5 4.1 3.8  CL 103 106 108 107  CO2 23 25 23 23   BUN 10 9 10 10   CREATININE 1.51* 1.40* 1.34* 1.32*  CALCIUM 8.4* 7.8* 8.1* 8.2*  PROT 6.4* 5.7*  --   --   BILITOT 1.8* 1.3*  --   --   ALKPHOS 130* 104  --   --   ALT 31 26  --   --   AST 51* 41  --   --   GLUCOSE 104* 121* 121* 91     Imaging/Diagnostic Tests: Dg Chest Portable 1 View  12/29/2015 FINDINGS: The heart size and mediastinal contours are within normal limits. Both lungs are clear. The visualized skeletal structures are unremarkable. Remote left inferior rib fracture with nonunion.  IMPRESSION: Stable chest exam.  No superimposed acute process.   Dg Hip Unilat With Pelvis 2-3 Views Left - 12/30/2015 FINDINGS: Frontal pelvis as well as frontal and lateral left hip images were obtained. There is no fracture or dislocation. There is slight symmetric narrowing of both hip joints. No erosive change or bony destruction.  IMPRESSION: Slight symmetric narrowing both hip joints. No acute fracture or dislocation.    Juan Coombe, MD 01/01/2016, 8:37 AM PGY-1, [facility] Intern pager: [phone], text pages welcome

## 2016-01-01 NOTE — Care Management (Signed)
Case manager following for determination on patient  discharge plan.

## 2016-01-01 NOTE — Progress Notes (Signed)
Subjective: 3 Days Post-Op Procedure(s) (LRB): ARTHROSCOPIC IRRIGATION AND DEBRIEDMENT RIGHT  KNEE (Right) Patient reports pain as 5 on 0-10 scale. Pain and swelling of Right Knee.He has left Flank pain so I will do a CT Scan of his abdomen.   Objective: Vital signs in last 24 hours: Temp:  [98.9 F (37.2 C)-99.3 F (37.4 C)] 99.3 F (37.4 C) (11/08 0436) Pulse Rate:  [62-72] 72 (11/08 0436) Resp:  [16-18] 16 (11/08 0436) BP: (121-131)/(62-68) 131/62 (11/08 0436) SpO2:  [94 %-96 %] 94 % (11/08 0436)  Intake/Output from previous day: 11/07 0701 - 11/08 0700 In: 1840 [P.O.:840; I.V.:900; IV Piggyback:100] Out: -  Intake/Output this shift: No intake/output data recorded.   Recent Labs  12/29/15 1119 12/30/15 0432 12/31/15 0758 01/01/16 0500  HGB 14.0 12.5* 12.7* 12.7*    Recent Labs  12/31/15 0758 01/01/16 0500  WBC 12.0* 12.3*  RBC 3.83* 3.81*  HCT 37.8* 37.0*  PLT 225 268    Recent Labs  12/31/15 0758 01/01/16 0500  NA 135 135  K 4.1 3.8  CL 108 107  CO2 23 23  BUN 10 10  CREATININE 1.34* 1.32*  GLUCOSE 121* 91  CALCIUM 8.1* 8.2*    Recent Labs  12/29/15 1119  INR 1.27    Neurologically intact  Assessment/Plan: 3 Days Post-Op Procedure(s) (LRB): ARTHROSCOPIC IRRIGATION AND DEBRIEDMENT RIGHT  KNEE (Right) Up with therapy Plan   on I&D of right knee tomorrow.  Janyra Barillas A 01/01/2016, 8:47 AM

## 2016-01-01 NOTE — Progress Notes (Signed)
Visited with patient who'll have surgery tomorrow. Provided spiritual/emotional support and prayer. Chaplain available for follow-up.   01/01/16 1600  Clinical Encounter Type  Visited With Patient  Visit Type Initial  Referral From Chaplain  Spiritual Encounters  Spiritual Needs Prayer;Emotional  Stress Factors  Patient Stress Factors Health changes;Loss of control

## 2016-01-01 NOTE — Progress Notes (Signed)
Subjective: 3 Days Post-Op Procedure(s) (LRB): ARTHROSCOPIC IRRIGATION AND DEBRIEDMENT RIGHT  KNEE (Right) Patient reports pain as 5 on 0-10 scale.    Objective: Vital signs in last 24 hours: Temp:  [98.9 F (37.2 C)-99.3 F (37.4 C)] 99.3 F (37.4 C) (11/08 0436) Pulse Rate:  [62-74] 72 (11/08 0436) Resp:  [16-18] 16 (11/08 0436) BP: (117-131)/(62-68) 131/62 (11/08 0436) SpO2:  [94 %-96 %] 94 % (11/08 0436)  Intake/Output from previous day: 11/07 0701 - 11/08 0700 In: 1840 [P.O.:840; I.V.:900; IV Piggyback:100] Out: -  Intake/Output this shift: No intake/output data recorded.   Recent Labs  12/29/15 1119 12/30/15 0432 12/31/15 0758 01/01/16 0500  HGB 14.0 12.5* 12.7* 12.7*    Recent Labs  12/31/15 0758 01/01/16 0500  WBC 12.0* 12.3*  RBC 3.83* 3.81*  HCT 37.8* 37.0*  PLT 225 268    Recent Labs  12/31/15 0758 01/01/16 0500  NA 135 135  K 4.1 3.8  CL 108 107  CO2 23 23  BUN 10 10  CREATININE 1.34* 1.32*  GLUCOSE 121* 91  CALCIUM 8.1* 8.2*    Recent Labs  12/29/15 1119  INR 1.27    Dorsiflexion/Plantar flexion intact  Assessment/Plan: 3 Days Post-Op Procedure(s) (LRB): ARTHROSCOPIC IRRIGATION AND DEBRIEDMENT RIGHT  KNEE (Right) Up with therapy  Juan Wiggins A 01/01/2016, 8:06 AM

## 2016-01-01 NOTE — Progress Notes (Signed)
Transitions of Care Pharmacy Note  Plan:  Educated on Cefazolin antibiotic, PTA sofosbuvir/velpatasvir and tenofovir. Also discussed possibility that blood pressure meds may change on discharge and to follow AVS to determine meds to stop/resume. --------------------------------------------- Juan Wiggins is an 65 y.o. male who presents with a chief complaint right knee joint infection. In anticipation of discharge, pharmacy has reviewed this patient's prior to admission medication history, as well as current inpatient medications listed per the [facility].  Current medication indications, dosing, frequency, and notable side effects reviewed with patient. patient verbalized understanding of current inpatient medication regimen and is aware that the After Visit Summary when presented, will represent the most accurate medication list at discharge.   Assessment: Understanding of regimen: good Understanding of indications: good Potential of compliance: good Barriers to Obtaining Medications: No - patient stated that cost of meds/access is not an issue for him.   Patient instructed to contact inpatient pharmacy team with further questions or concerns if needed.    Time spent preparing for discharge counseling: 10 minutes  Time spent counseling patient: 15 minutes   Thank you for allowing pharmacy to be a part of this patient's care.  Demetrius Charity, PharmD Acute Care Pharmacy Resident  Pager: [phone] 01/01/2016

## 2016-01-01 NOTE — Progress Notes (Signed)
OT Screen and Discharge Note  Patient Details Name: Juan Wiggins MRN: [id number] DOB: 1950-08-17   Cancelled Treatment:    Reason Eval/Treat Not Completed: OT screened, no needs identified, will sign off. Pt in the bathroom when OT entered, and is operating at Mod I level of independence. Should concern arise, please re-order OT. Thank you for this referral. OT signing off.   [place] 01/01/2016, 2:40 PM  Hulda Humphrey OTR/L [phone]

## 2016-01-02 ENCOUNTER — Inpatient Hospital Stay (HOSPITAL_COMMUNITY): Payer: 59

## 2016-01-02 ENCOUNTER — Encounter (HOSPITAL_COMMUNITY): Payer: Self-pay | Admitting: Anesthesiology

## 2016-01-02 ENCOUNTER — Inpatient Hospital Stay (HOSPITAL_COMMUNITY): Payer: 59 | Admitting: Anesthesiology

## 2016-01-02 ENCOUNTER — Encounter (HOSPITAL_COMMUNITY)
Admission: EM | Disposition: A | Discharge status: Home Health | Payer: Self-pay | Source: Home / Self Care | Attending: Family Medicine

## 2016-01-02 DIAGNOSIS — J189 Pneumonia, unspecified organism: Secondary | ICD-10-CM

## 2016-01-02 DIAGNOSIS — Z96659 Presence of unspecified artificial knee joint: Secondary | ICD-10-CM

## 2016-01-02 DIAGNOSIS — T8459XA Infection and inflammatory reaction due to other internal joint prosthesis, initial encounter: Secondary | ICD-10-CM

## 2016-01-02 DIAGNOSIS — J90 Pleural effusion, not elsewhere classified: Secondary | ICD-10-CM

## 2016-01-02 HISTORY — DX: Pneumonia, unspecified organism: J18.9

## 2016-01-02 HISTORY — PX: I&D EXTREMITY: SHX5045

## 2016-01-02 LAB — CBC
HCT: 38 % — ABNORMAL LOW (ref 39.0–52.0)
Hemoglobin: 13.1 g/dL (ref 13.0–17.0)
MCH: 33.2 pg (ref 26.0–34.0)
MCHC: 34.5 g/dL (ref 30.0–36.0)
MCV: 96.4 fL (ref 78.0–100.0)
PLATELETS: 312 10*3/uL (ref 150–400)
RBC: 3.94 MIL/uL — ABNORMAL LOW (ref 4.22–5.81)
RDW: 15.6 % — AB (ref 11.5–15.5)
WBC: 12.9 10*3/uL — ABNORMAL HIGH (ref 4.0–10.5)

## 2016-01-02 LAB — BASIC METABOLIC PANEL
Anion gap: 5 (ref 5–15)
BUN: 7 mg/dL (ref 6–20)
CO2: 26 mmol/L (ref 22–32)
CREATININE: 1.24 mg/dL (ref 0.61–1.24)
Calcium: 8.2 mg/dL — ABNORMAL LOW (ref 8.9–10.3)
Chloride: 105 mmol/L (ref 101–111)
GFR calc Af Amer: >60 mL/min (ref >60)
GLUCOSE: 103 mg/dL — AB (ref 65–99)
Potassium: 3.7 mmol/L (ref 3.5–5.1)
SODIUM: 136 mmol/L (ref 135–145)

## 2016-01-02 SURGERY — IRRIGATION AND DEBRIDEMENT EXTREMITY
Anesthesia: General | Site: Knee | Laterality: Right

## 2016-01-02 MED ORDER — ONDANSETRON HCL 4 MG/2ML IJ SOLN: 4.0000 mg | Freq: Once | INTRAMUSCULAR | Status: DC | PRN

## 2016-01-02 MED ORDER — CHLORHEXIDINE GLUCONATE CLOTH 2 % EX PADS
6.0000 | MEDICATED_PAD | Freq: Every day | CUTANEOUS | Status: DC
  Administered 2016-01-06: 6 via TOPICAL

## 2016-01-02 MED ORDER — FENTANYL CITRATE (PF) 100 MCG/2ML IJ SOLN
INTRAMUSCULAR | Status: DC | PRN
  Administered 2016-01-02 (×2): 50 ug via INTRAVENOUS

## 2016-01-02 MED ORDER — PROPOFOL 10 MG/ML IV BOLUS
INTRAVENOUS | Status: AC
  Filled 2016-01-02: qty 20

## 2016-01-02 MED ORDER — FLEET ENEMA 7-19 GM/118ML RE ENEM: 1.0000 | ENEMA | Freq: Once | RECTAL | Status: DC | PRN

## 2016-01-02 MED ORDER — FENTANYL CITRATE (PF) 100 MCG/2ML IJ SOLN: 25.0000 ug | INTRAMUSCULAR | Status: DC | PRN

## 2016-01-02 MED ORDER — LIDOCAINE HCL (CARDIAC) 20 MG/ML IV SOLN
INTRAVENOUS | Status: DC | PRN
  Administered 2016-01-02: 40 mg via INTRAVENOUS

## 2016-01-02 MED ORDER — LACTATED RINGERS IV SOLN
INTRAVENOUS | Status: DC
  Administered 2016-01-02: 13:00:00 via INTRAVENOUS

## 2016-01-02 MED ORDER — METHOCARBAMOL 1000 MG/10ML IJ SOLN
500.0000 mg | Freq: Four times a day (QID) | INTRAVENOUS | Status: DC | PRN
  Filled 2016-01-02: qty 5

## 2016-01-02 MED ORDER — FENTANYL CITRATE (PF) 100 MCG/2ML IJ SOLN
INTRAMUSCULAR | Status: AC
  Filled 2016-01-02: qty 2

## 2016-01-02 MED ORDER — HYDROMORPHONE HCL 2 MG/ML IJ SOLN
1.0000 mg | INTRAMUSCULAR | Status: DC | PRN
  Administered 2016-01-02 – 2016-01-04 (×8): 1 mg via INTRAVENOUS
  Filled 2016-01-02 (×8): qty 1

## 2016-01-02 MED ORDER — SODIUM CHLORIDE 0.9 % IR SOLN
Status: DC | PRN
  Administered 2016-01-02 (×2): 3000 mL

## 2016-01-02 MED ORDER — SUCCINYLCHOLINE CHLORIDE 20 MG/ML IJ SOLN
INTRAMUSCULAR | Status: DC | PRN
  Administered 2016-01-02: 120 mg via INTRAVENOUS

## 2016-01-02 MED ORDER — OXYCODONE HCL 5 MG/5ML PO SOLN: 5.0000 mg | Freq: Once | ORAL | Status: DC | PRN

## 2016-01-02 MED ORDER — ACETAMINOPHEN 650 MG RE SUPP: 650.0000 mg | Freq: Four times a day (QID) | RECTAL | Status: DC | PRN

## 2016-01-02 MED ORDER — OXYCODONE HCL 5 MG PO TABS
5.0000 mg | ORAL_TABLET | Freq: Three times a day (TID) | ORAL | Status: DC
  Administered 2016-01-02 – 2016-01-04 (×6): 5 mg via ORAL
  Filled 2016-01-02 (×6): qty 1

## 2016-01-02 MED ORDER — FERROUS SULFATE 325 (65 FE) MG PO TABS
325.0000 mg | ORAL_TABLET | Freq: Three times a day (TID) | ORAL | Status: DC
  Administered 2016-01-02 – 2016-01-06 (×8): 325 mg via ORAL
  Filled 2016-01-02 (×9): qty 1

## 2016-01-02 MED ORDER — OXYCODONE HCL 5 MG PO TABS: 5.0000 mg | ORAL_TABLET | Freq: Once | ORAL | Status: DC | PRN

## 2016-01-02 MED ORDER — BISACODYL 5 MG PO TBEC: 5.0000 mg | DELAYED_RELEASE_TABLET | Freq: Every day | ORAL | Status: DC | PRN

## 2016-01-02 MED ORDER — EPHEDRINE SULFATE 50 MG/ML IJ SOLN
INTRAMUSCULAR | Status: DC | PRN
  Administered 2016-01-02: 10 mg via INTRAVENOUS

## 2016-01-02 MED ORDER — METHOCARBAMOL 500 MG PO TABS
500.0000 mg | ORAL_TABLET | Freq: Four times a day (QID) | ORAL | Status: DC | PRN
  Administered 2016-01-03 – 2016-01-06 (×5): 500 mg via ORAL
  Filled 2016-01-02 (×7): qty 1

## 2016-01-02 MED ORDER — ALUM & MAG HYDROXIDE-SIMETH 200-200-20 MG/5ML PO SUSP: 30.0000 mL | ORAL | Status: DC | PRN

## 2016-01-02 MED ORDER — ASPIRIN EC 325 MG PO TBEC
325.0000 mg | DELAYED_RELEASE_TABLET | Freq: Every day | ORAL | Status: DC
  Administered 2016-01-03 – 2016-01-06 (×4): 325 mg via ORAL
  Filled 2016-01-02 (×4): qty 1

## 2016-01-02 MED ORDER — MIDAZOLAM HCL 5 MG/5ML IJ SOLN
INTRAMUSCULAR | Status: DC | PRN
  Administered 2016-01-02: 1 mg via INTRAVENOUS

## 2016-01-02 MED ORDER — PHENOL 1.4 % MT LIQD
1.0000 | OROMUCOSAL | Status: DC | PRN
Start: 2016-01-02 — End: 2016-01-06

## 2016-01-02 MED ORDER — POLYETHYLENE GLYCOL 3350 17 G PO PACK
17.0000 g | PACK | Freq: Every day | ORAL | Status: DC | PRN
  Administered 2016-01-05: 17 g via ORAL
  Filled 2016-01-02: qty 1

## 2016-01-02 MED ORDER — ONDANSETRON HCL 4 MG PO TABS: 4.0000 mg | ORAL_TABLET | Freq: Four times a day (QID) | ORAL | Status: DC | PRN

## 2016-01-02 MED ORDER — ONDANSETRON HCL 4 MG/2ML IJ SOLN
4.0000 mg | Freq: Four times a day (QID) | INTRAMUSCULAR | Status: DC | PRN
  Administered 2016-01-05: 4 mg via INTRAVENOUS
  Filled 2016-01-02: qty 2

## 2016-01-02 MED ORDER — ACETAMINOPHEN 325 MG PO TABS
650.0000 mg | ORAL_TABLET | Freq: Four times a day (QID) | ORAL | Status: DC | PRN
  Administered 2016-01-03: 650 mg via ORAL
  Filled 2016-01-02: qty 2

## 2016-01-02 MED ORDER — CEFAZOLIN IN D5W 1 GM/50ML IV SOLN: 1.0000 g | Freq: Four times a day (QID) | INTRAVENOUS | Status: DC

## 2016-01-02 MED ORDER — PHENYLEPHRINE HCL 10 MG/ML IJ SOLN
INTRAMUSCULAR | Status: DC | PRN
  Administered 2016-01-02: 40 ug via INTRAVENOUS
  Administered 2016-01-02: 80 ug via INTRAVENOUS
  Administered 2016-01-02: 40 ug via INTRAVENOUS

## 2016-01-02 MED ORDER — MENTHOL 3 MG MT LOZG: 1.0000 | LOZENGE | OROMUCOSAL | Status: DC | PRN

## 2016-01-02 MED ORDER — PROPOFOL 10 MG/ML IV BOLUS
INTRAVENOUS | Status: DC | PRN
  Administered 2016-01-02: 160 mg via INTRAVENOUS

## 2016-01-02 MED ORDER — MIDAZOLAM HCL 2 MG/2ML IJ SOLN
INTRAMUSCULAR | Status: AC
  Filled 2016-01-02: qty 2

## 2016-01-02 MED ORDER — LACTATED RINGERS IV SOLN
INTRAVENOUS | Status: DC
  Administered 2016-01-02: 17:00:00 via INTRAVENOUS

## 2016-01-02 SURGICAL SUPPLY — 63 items
BAG DECANTER FOR FLEXI CONT (MISCELLANEOUS) ×3 IMPLANT
BANDAGE ACE 4X5 VEL STRL LF (GAUZE/BANDAGES/DRESSINGS) IMPLANT
BANDAGE ACE 6X5 VEL STRL LF (GAUZE/BANDAGES/DRESSINGS) IMPLANT
BANDAGE ELASTIC 4 VELCRO ST LF (GAUZE/BANDAGES/DRESSINGS) ×3 IMPLANT
BANDAGE ELASTIC 6 VELCRO ST LF (GAUZE/BANDAGES/DRESSINGS) ×3 IMPLANT
BANDAGE ESMARK 6X9 LF (GAUZE/BANDAGES/DRESSINGS) ×1 IMPLANT
BLADE SURG 11 STRL SS (BLADE) ×3 IMPLANT
BNDG COHESIVE 6X5 TAN STRL LF (GAUZE/BANDAGES/DRESSINGS) ×3 IMPLANT
BNDG ESMARK 6X9 LF (GAUZE/BANDAGES/DRESSINGS) ×3
BNDG GAUZE ELAST 4 BULKY (GAUZE/BANDAGES/DRESSINGS) ×3 IMPLANT
COVER MAYO STAND STRL (DRAPES) ×3 IMPLANT
COVER SURGICAL LIGHT HANDLE (MISCELLANEOUS) ×3 IMPLANT
CUFF TOURNIQUET SINGLE 18IN (TOURNIQUET CUFF) IMPLANT
CUFF TOURNIQUET SINGLE 24IN (TOURNIQUET CUFF) IMPLANT
CUFF TOURNIQUET SINGLE 34IN LL (TOURNIQUET CUFF) IMPLANT
CUFF TOURNIQUET SINGLE 44IN (TOURNIQUET CUFF) IMPLANT
DRAPE ARTHROSCOPY W/POUCH 114 (DRAPES) ×3 IMPLANT
DRAPE U-SHAPE 47X51 STRL (DRAPES) IMPLANT
DRSG ADAPTIC 3X8 NADH LF (GAUZE/BANDAGES/DRESSINGS) IMPLANT
DRSG PAD ABDOMINAL 8X10 ST (GAUZE/BANDAGES/DRESSINGS) ×12 IMPLANT
DURAPREP 26ML APPLICATOR (WOUND CARE) ×3 IMPLANT
ELECT REM PT RETURN 9FT ADLT (ELECTROSURGICAL) ×3
ELECTRODE REM PT RTRN 9FT ADLT (ELECTROSURGICAL) ×1 IMPLANT
FACESHIELD WRAPAROUND (MASK) IMPLANT
GAUZE SPONGE 4X4 12PLY STRL (GAUZE/BANDAGES/DRESSINGS) ×3 IMPLANT
GLOVE BIO SURGEON STRL SZ8.5 (GLOVE) IMPLANT
GLOVE BIOGEL PI IND STRL 8.5 (GLOVE) ×1 IMPLANT
GLOVE BIOGEL PI INDICATOR 8.5 (GLOVE) ×2
GOWN STRL REUS W/ TWL LRG LVL3 (GOWN DISPOSABLE) ×2 IMPLANT
GOWN STRL REUS W/TWL LRG LVL3 (GOWN DISPOSABLE) ×4
HANDPIECE INTERPULSE COAX TIP (DISPOSABLE)
KIT BASIN OR (CUSTOM PROCEDURE TRAY) ×3 IMPLANT
KIT ROOM TURNOVER OR (KITS) ×3 IMPLANT
MANIFOLD NEPTUNE II (INSTRUMENTS) ×3 IMPLANT
NEEDLE 22X1 1/2 (OR ONLY) (NEEDLE) ×3 IMPLANT
NEEDLE SPNL 18GX3.5 QUINCKE PK (NEEDLE) ×3 IMPLANT
NS IRRIG 1000ML POUR BTL (IV SOLUTION) IMPLANT
PACK ORTHO EXTREMITY (CUSTOM PROCEDURE TRAY) ×3 IMPLANT
PAD ARMBOARD 7.5X6 YLW CONV (MISCELLANEOUS) ×6 IMPLANT
PAD CAST 4YDX4 CTTN HI CHSV (CAST SUPPLIES) IMPLANT
PADDING CAST COTTON 4X4 STRL (CAST SUPPLIES)
SET ARTHROSCOPY TUBING (MISCELLANEOUS) ×2
SET ARTHROSCOPY TUBING LN (MISCELLANEOUS) ×1 IMPLANT
SET CYSTO W/LG BORE CLAMP LF (SET/KITS/TRAYS/PACK) ×3 IMPLANT
SET HNDPC FAN SPRY TIP SCT (DISPOSABLE) IMPLANT
SPONGE LAP 18X18 X RAY DECT (DISPOSABLE) ×3 IMPLANT
SPONGE LAP 4X18 X RAY DECT (DISPOSABLE) IMPLANT
STAPLER VISISTAT 35W (STAPLE) IMPLANT
STOCKINETTE IMPERVIOUS LG (DRAPES) ×3 IMPLANT
SUCTION FRAZIER HANDLE 10FR (MISCELLANEOUS) ×2
SUCTION TUBE FRAZIER 10FR DISP (MISCELLANEOUS) ×1 IMPLANT
SUT ETHILON 3 0 PS 1 (SUTURE) ×3 IMPLANT
SUT ETHILON 4 0 FS 1 (SUTURE) IMPLANT
SUT VIC AB 2-0 CT1 27 (SUTURE)
SUT VIC AB 2-0 CT1 TAPERPNT 27 (SUTURE) IMPLANT
SYSTEM SURGIPACK (MISCELLANEOUS) IMPLANT
TOWEL OR 17X24 6PK STRL BLUE (TOWEL DISPOSABLE) ×3 IMPLANT
TOWEL OR 17X26 10 PK STRL BLUE (TOWEL DISPOSABLE) ×3 IMPLANT
TUBE ANAEROBIC SPECIMEN COL (MISCELLANEOUS) ×3 IMPLANT
TUBE CONNECTING 12'X1/4 (SUCTIONS) ×1
TUBE CONNECTING 12X1/4 (SUCTIONS) ×2 IMPLANT
WATER STERILE IRR 1000ML POUR (IV SOLUTION) ×3 IMPLANT
YANKAUER SUCT BULB TIP NO VENT (SUCTIONS) ×3 IMPLANT

## 2016-01-02 NOTE — Brief Op Note (Signed)
12/29/2015 - 01/02/2016  2:32 PM  PATIENT:  Juan Wiggins  65 y.o. male  PRE-OPERATIVE DIAGNOSIS:  RIGHT TOTAL KNEE INFECTION  POST-OPERATIVE DIAGNOSIS:  RIGHT TOTAL KNEE INFECTION  PROCEDURE:  Procedure(s): IRRIGATION AND DEBRIDEMENT RIGHT TOTAL KNEE (Right)  SURGEON:  Surgeon(s) and Role:    * Latanya Maudlin, MD - Primary  PHYSICIAN ASSISTANT:Amber [place] PA   ASSISTANTS: Ardeen Jourdain PA   ANESTHESIA:  Ardeen Jourdain PA  EBL:  No intake/output data recorded.  BLOOD ADMINISTERED:none  DRAINS: none   LOCAL MEDICATIONS USED:  NONE  SPECIMEN:  Source of Specimen:  Right Knee  DISPOSITION OF SPECIMEN:  PATHOLOGY  COUNTS:  YES  TOURNIQUET:  * No tourniquets in log *  DICTATION: .Other Dictation: Dictation Number [id number]  PLAN OF CARE: Admit to inpatient   PATIENT DISPOSITION:  PACU - hemodynamically stable.   Delay start of Pharmacological VTE agent (>24hrs) due to surgical blood loss or risk of bleeding: yes

## 2016-01-02 NOTE — Transfer of Care (Signed)
Immediate Anesthesia Transfer of Care Note  Patient: SHAARAV OSMOND  Procedure(s) Performed: Procedure(s): IRRIGATION AND DEBRIDEMENT RIGHT TOTAL KNEE (Right)  Patient Location: PACU  Anesthesia Type:General  Level of Consciousness: awake, alert , oriented and patient cooperative  Airway & Oxygen Therapy: Patient Spontanous Breathing and Patient connected to nasal cannula oxygen  Post-op Assessment: Report given to RN, Post -op Vital signs reviewed and stable and Patient moving all extremities  Post vital signs: Reviewed and stable  Last Vitals:  Vitals:   01/02/16 1205 01/02/16 1439  BP: (!) 143/75   Pulse: 78   Resp:    Temp: 37.3 C 36.6 C    Last Pain:  Vitals:   01/02/16 1205  TempSrc: Oral  PainSc:       Patients Stated Pain Goal: 3 ([id number])  Complications: No apparent anesthesia complications

## 2016-01-02 NOTE — Progress Notes (Signed)
Spoke with Dr. Linus Salmons (ID) regarding antibiotic recommendations now that patient is refusing to go to SNF. Dr. Linus Salmons is recommending either Ceftriaxone 2mg  IM x 6 weeks or Linezolid PO x 6 weeks (both of these options are sub-optimal). Per Dr. Linus Salmons, Linezolid can have a lot of serious side effects when used for > 2 weeks, including irreversible peripheral neuropathy. Ceftriaxone 2mg  IM seems the best option at this point. Also discussed Pt's CXR finding of new right upper lobe pneumonia. Dr. Linus Salmons recommended not treating for a pneumonia, because he has not had any symptoms.  - Will speak with case management about setting up IM Ceftriaxone x 6 weeks for this patient. - Appreciate Dr. Henreitta Leber assistance in this patient's care.  Hyman Bible, MD PGY-2

## 2016-01-02 NOTE — Anesthesia Postprocedure Evaluation (Signed)
Anesthesia Post Note  Patient: Juan Wiggins  Procedure(s) Performed: Procedure(s) (LRB): IRRIGATION AND DEBRIDEMENT RIGHT TOTAL KNEE (Right)  Patient location during evaluation: PACU Anesthesia Type: General Level of consciousness: awake, awake and alert and oriented Pain management: pain level controlled Vital Signs Assessment: post-procedure vital signs reviewed and stable Respiratory status: spontaneous breathing, nonlabored ventilation and respiratory function stable Cardiovascular status: blood pressure returned to baseline Anesthetic complications: no    Last Vitals:  Vitals:   01/02/16 0500 01/02/16 1205  BP: (!) 145/72 (!) 143/75  Pulse: 70 78  Resp: 16   Temp: 36.7 C 37.3 C    Last Pain:  Vitals:   01/02/16 1205  TempSrc: Oral  PainSc:                  Sandra Tellefsen COKER

## 2016-01-02 NOTE — Progress Notes (Signed)
Family Medicine Teaching Service Daily Progress Note Intern Pager: [phone]  Patient name: Juan Wiggins Medical record number: [id number] Date of birth: 1950/11/26 Age: 65 y.o. Gender: male  Primary Care Provider: Helane Rima, MD Consultants: Orthopedics Code Status: FULL  Pt Overview and Major Events to Date:  11/5 admit to FPTS, Right knee arthroscopy with irrigation and debridement  11/5 Started on Vancomycin  Assessment and Plan: Saveer Bivens Jonesis a 65 y.o.malepresenting with right knee pain found to have a septic joint. PMH is significant for hypothyroidism, alcohol abuse, Hepatitis B, Hepatitis C, HTN, GERD, chronic pain syndrome, hx of TKA (both in 2015), hx of R septic knee (06/2015), major depression, anxiety, BPH.   #Sepsis 2/2 to right knee joint s/p arthroscopy with irrigation and debridement:   Patient presented meeting sepsis criteria secondary to right knee joint infection, taken to the OR for arthrocentesisof the joint. Now day #3 s/p debridement.Plan is for patient to go for another debridement with orthopedics today 11/9.  Synovial right knee fluid cx with pan-sensitive Staph aureus.  - Continue cefazolin 2g TID per ID recs, which will continue for 6 weeks (through 12/17). - PICC in place. Patient does not want to go to SNF after discharge due to financial difficulty.  However with history of drug abuse, poor candidate for home with PICC.   - Discontinue Morphine 2 mg IV Q2h prn as he is on 2 prn pain medications.  - Pain control with Oxycodone IR 5-10mg  Q3 prn  - Nausea control with Zofran 4 mg Q6 prn  - PT - no follow up recommended - OT - no needs identified   #Pleural effusion, new.  CT A/P with new moderate size bilateral pleural effusions with associated bibasilar atelectasis.   Patient without symptoms and VSS.  -Obtain CXR -Monitor  #History of drug and alcohol abuse.    Blood ethanol level negative.   States his last drink was 2.5 years ago and history  of cocaine and marijuana >40 years ago.  UDS + for opiates, benzos and cocaine.   He did receive Dilaudid in the ED and possible Benzos for surgery.  -Take off CIWA protocol.  Has had scores of 0.  - monitor closely  #Back pain, likely MSK.  Has had surgery on L4-L5 vertebrae in the past by Ortho spine surgeon.  -  Hold flexeril while patient receiving opioids - continue to monitor - CT abdomen ordered for back pain by ortho (see above)  #Lactic acidosis, resolved. Lactate elevated at admission and after surgery, now normalized to 0.8 with fluids -Stop trending LA  #Hypertension. Hypotensive on admission with BP of 94/54, however fluids now resolved as sepsis resolved.  BP has been controlled off of propanolol which was stopped in setting of cocaine use.      - BP this AM controlled at 143/75.  - Hold Propranolol 60 mg daily in setting of his cocaine use.  - monitor BPs.  BPs off Propranolol have been controlled.   - Consider adding Norvasc if an anti-hypertensive is needed  #Hepatitis B, chronic.At home on Viread 300 mg daily. PT elevated 16. INR normal 1.27.  -Continue home Viread 300 mg daily -Monitor  #Hepatitis C, chronic. At home on Sofosbuvir-Velpatasvir [name]).  -Continue home Epclusa 400-100 mg po tab daily -Monitor  #Hypothyroidism. Stable. At home on Levothyroxine. Last TSH 13.8 (07/19/2015).  -Continue home Synthroid 200 mcg daily -repeat TSH 0.206 (low)  #difficulty sleeping. At home on Ambien 10 mg at bedtime.  -  Ambien 5mg  PRN  #Depression . Stable. At home on Lexapro and Remeron. Also on Ambien 10mg  PRN  -Continue Lexapro 20 mg daily at bedtime - Remeron   #BPH: - continue home Rapaflo   FEN/GI:  heart healthy, IV NS @ 75 cc/hr Prophylaxis: SCDs, will have to ask surgery regarding prophylaxis   Disposition: Dispo pending clinical improvement  Subjective:  Patient had no acute events overnight. States pain is well controlled.  He  would not like to go to a SNF and would like to have his antibiotics at home if possible.  Overall, doing well.  Going for his repeat  I&D today.     Objective: Temp:  [98.1 F (36.7 C)-99.1 F (37.3 C)] 99.1 F (37.3 C) (11/09 1205) Pulse Rate:  [68-78] 78 (11/09 1205) Resp:  [16] 16 (11/09 0500) BP: (141-145)/(71-76) 143/75 (11/09 1205) SpO2:  [94 %-97 %] 94 % (11/09 1205) Physical Exam: General: NAD, rests comfortably in bed, pleasant and appropriate Cardiovascular: RRR, no m/r/g Respiratory: CTA bil no W/R/R Abdomen: soft and nontender, NT, +bs Extremities: +bandage in place over R knee, site clean and dry, +minimal tenderness to palpation  Laboratory:  Recent Labs Lab 12/31/15 0758 01/01/16 0500 01/02/16 0430  WBC 12.0* 12.3* 12.9*  HGB 12.7* 12.7* 13.1  HCT 37.8* 37.0* 38.0*  PLT 225 268 312    Recent Labs Lab 12/29/15 1119 12/30/15 0432 12/31/15 0758 01/01/16 0500 01/02/16 0430  NA 134* 136 135 135 136  K 4.3 4.5 4.1 3.8 3.7  CL 103 106 108 107 105  CO2 23 25 23 23 26   BUN 10 9 10 10 7   CREATININE 1.51* 1.40* 1.34* 1.32* 1.24  CALCIUM 8.4* 7.8* 8.1* 8.2* 8.2*  PROT 6.4* 5.7*  --   --   --   BILITOT 1.8* 1.3*  --   --   --   ALKPHOS 130* 104  --   --   --   ALT 31 26  --   --   --   AST 51* 41  --   --   --   GLUCOSE 104* 121* 121* 91 103*   Imaging/Diagnostic Tests: Dg Chest Portable 1 View  12/29/2015 FINDINGS: The heart size and mediastinal contours are within normal limits. Both lungs are clear. The visualized skeletal structures are unremarkable. Remote left inferior rib fracture with nonunion.  IMPRESSION: Stable chest exam.  No superimposed acute process.   Dg Hip Unilat With Pelvis 2-3 Views Left - 12/30/2015 FINDINGS: Frontal pelvis as well as frontal and lateral left hip images were obtained. There is no fracture or dislocation. There is slight symmetric narrowing of both hip joints. No erosive change or bony destruction.  IMPRESSION: Slight  symmetric narrowing both hip joints. No acute fracture or dislocation.   [name], MD 01/02/2016, 12:44 PM PGY-1, [place] Intern pager: [phone], text pages welcome

## 2016-01-02 NOTE — Progress Notes (Signed)
Spoke with Juan Wiggins about his antibiotics. Patient states that he is unable to go to a facility everyday to get an IM injection. He wants to do the PO regimen (Linezolid). I counseled patient on the cost of this PO medication and the possible long term irreversible side effects. He wants to take the risk. Will reassess again in the morning with patient and counsel again.   Ultimately we would prefer patient to go to SNF for 6 weeks of IV therapy for best medical management.   Luiz Blare, DO 01/02/2016, 5:50 PM PGY-3, [place]

## 2016-01-02 NOTE — Anesthesia Procedure Notes (Signed)
Procedure Name: Intubation Date/Time: 01/02/2016 1:45 PM Performed by: Williemae Area B Pre-anesthesia Checklist: Patient identified, Emergency Drugs available, Suction available and Patient being monitored Patient Re-evaluated:Patient Re-evaluated prior to inductionOxygen Delivery Method: Circle system utilized Preoxygenation: Pre-oxygenation with 100% oxygen Intubation Type: IV induction Ventilation: Mask ventilation without difficulty Laryngoscope Size: Glidescope and 4 Grade View: Grade I Tube type: Oral Tube size: 7.5 mm Number of attempts: 1 Placement Confirmation: ETT inserted through vocal cords under direct vision,  positive ETCO2 and breath sounds checked- equal and bilateral Secured at: 21 (cm at teeth) cm Tube secured with: Tape Dental Injury: Teeth and Oropharynx as per pre-operative assessment

## 2016-01-02 NOTE — Interval H&P Note (Signed)
History and Physical Interval Note:  01/02/2016 1:15 PM  Juan Wiggins  has presented today for surgery, with the diagnosis of RIGHT TOTAL KNEE INFECTION  The various methods of treatment have been discussed with the patient and family. After consideration of risks, benefits and other options for treatment, the patient has consented to  Procedure(s): IRRIGATION AND DEBRIDEMENT RIGHT TOTAL KNEE (Right) as a surgical intervention .  The patient's history has been reviewed, patient examined, no change in status, stable for surgery.  I have reviewed the patient's chart and labs.  Questions were answered to the patient's satisfaction.     Vaishnavi Dalby A

## 2016-01-02 NOTE — H&P (View-Only) (Signed)
Subjective: 3 Days Post-Op Procedure(s) (LRB): ARTHROSCOPIC IRRIGATION AND DEBRIEDMENT RIGHT  KNEE (Right) Patient reports pain as 5 on 0-10 scale. Pain and swelling of Right Knee.He has left Flank pain so I will do a CT Scan of his abdomen.   Objective: Vital signs in last 24 hours: Temp:  [98.9 F (37.2 C)-99.3 F (37.4 C)] 99.3 F (37.4 C) (11/08 0436) Pulse Rate:  [62-72] 72 (11/08 0436) Resp:  [16-18] 16 (11/08 0436) BP: (121-131)/(62-68) 131/62 (11/08 0436) SpO2:  [94 %-96 %] 94 % (11/08 0436)  Intake/Output from previous day: 11/07 0701 - 11/08 0700 In: 1840 [P.O.:840; I.V.:900; IV Piggyback:100] Out: -  Intake/Output this shift: No intake/output data recorded.   Recent Labs  12/29/15 1119 12/30/15 0432 12/31/15 0758 01/01/16 0500  HGB 14.0 12.5* 12.7* 12.7*    Recent Labs  12/31/15 0758 01/01/16 0500  WBC 12.0* 12.3*  RBC 3.83* 3.81*  HCT 37.8* 37.0*  PLT 225 268    Recent Labs  12/31/15 0758 01/01/16 0500  NA 135 135  K 4.1 3.8  CL 108 107  CO2 23 23  BUN 10 10  CREATININE 1.34* 1.32*  GLUCOSE 121* 91  CALCIUM 8.1* 8.2*    Recent Labs  12/29/15 1119  INR 1.27    Neurologically intact  Assessment/Plan: 3 Days Post-Op Procedure(s) (LRB): ARTHROSCOPIC IRRIGATION AND DEBRIEDMENT RIGHT  KNEE (Right) Up with therapy Plan   on I&D of right knee tomorrow.  Lametria Klunk A 01/01/2016, 8:47 AM

## 2016-01-02 NOTE — Anesthesia Preprocedure Evaluation (Addendum)
Anesthesia Evaluation  Patient identified by MRN, date of birth, ID band Patient awake    Reviewed: Allergy & Precautions, NPO status , Patient's Chart, lab work & pertinent test results  Airway Mallampati: II  TM Distance: >3 FB Neck ROM: Full    Dental  (+) Dental Advisory Given, Teeth Intact   Pulmonary former smoker,    breath sounds clear to auscultation       Cardiovascular hypertension,  Rhythm:Regular Rate:Normal     Neuro/Psych    GI/Hepatic (+)     substance abuse  alcohol use, cocaine use and marijuana use, Hepatitis -, A, B, C  Endo/Other  Hypothyroidism   Renal/GU      Musculoskeletal   Abdominal   Peds  Hematology   Anesthesia Other Findings   Reproductive/Obstetrics                            Anesthesia Physical Anesthesia Plan  ASA: III  Anesthesia Plan: General   Post-op Pain Management:    Induction: Intravenous  Airway Management Planned: LMA  Additional Equipment:   Intra-op Plan:   Post-operative Plan:   Informed Consent: I have reviewed the patients History and Physical, chart, labs and discussed the procedure including the risks, benefits and alternatives for the proposed anesthesia with the patient or authorized representative who has indicated his/her understanding and acceptance.   Dental advisory given  Plan Discussed with: CRNA and Anesthesiologist  Anesthesia Plan Comments:         Anesthesia Quick Evaluation

## 2016-01-02 NOTE — Progress Notes (Signed)
SCD's ordered by unit sec

## 2016-01-03 ENCOUNTER — Encounter (HOSPITAL_COMMUNITY): Payer: Self-pay | Admitting: Orthopedic Surgery

## 2016-01-03 LAB — CULTURE, BLOOD (ROUTINE X 2)
CULTURE: NO GROWTH
CULTURE: NO GROWTH

## 2016-01-03 LAB — CBC
HCT: 36.4 % — ABNORMAL LOW (ref 39.0–52.0)
Hemoglobin: 12.3 g/dL — ABNORMAL LOW (ref 13.0–17.0)
MCH: 32.7 pg (ref 26.0–34.0)
MCHC: 33.8 g/dL (ref 30.0–36.0)
MCV: 96.8 fL (ref 78.0–100.0)
PLATELETS: 293 10*3/uL (ref 150–400)
RBC: 3.76 MIL/uL — AB (ref 4.22–5.81)
RDW: 15.5 % (ref 11.5–15.5)
WBC: 10.7 10*3/uL — AB (ref 4.0–10.5)

## 2016-01-03 LAB — BASIC METABOLIC PANEL
Anion gap: 6 (ref 5–15)
BUN: 6 mg/dL (ref 6–20)
CALCIUM: 8 mg/dL — AB (ref 8.9–10.3)
CHLORIDE: 108 mmol/L (ref 101–111)
CO2: 23 mmol/L (ref 22–32)
CREATININE: 1.16 mg/dL (ref 0.61–1.24)
GFR calc non Af Amer: >60 mL/min (ref >60)
Glucose, Bld: 84 mg/dL (ref 65–99)
Potassium: 3.9 mmol/L (ref 3.5–5.1)
SODIUM: 137 mmol/L (ref 135–145)

## 2016-01-03 NOTE — Evaluation (Addendum)
Physical Therapy Evaluation Patient Details Name: Juan Wiggins MRN: [id number] DOB: Oct 19, 1950 Today's Date: 01/03/2016   History of Present Illness  65 y.o. male presenting with right knee pain found to have a septic joint . PMH is significant for hypothyroidism, alcohol abuse, Hepatitis B, Hepatitis C,  HTN, GERD, chronic pain syndrome, hx of TKA (both in 2015), hx of R septic knee (06/2015), major depression, anxiety, BPH.  s/p arthroscopy with i and d on 12/29/15 and again on 01/02/16.  Clinical Impression  Pt admitted with above diagnosis. Pt currently with functional limitations due to the deficits listed below (see PT Problem List).  Pt will benefit from skilled PT to increase their independence and safety with mobility to allow discharge to the venue listed below.  Pt close to baseline, although slightly more unsteady after most recent I & D.  Pt is safe to ambulate with RW on unit.  Pt interested in HHPT to work on therex, although pt is close to baseline and will probably not need it at time of d/c.  Will follow acutely to maximize independence.    Follow Up Recommendations No PT follow up (although may be more compliant with therex with HHPT)    Equipment Recommendations  None recommended by PT    Recommendations for Other Services       Precautions / Restrictions Precautions Precautions: None Restrictions RLE Weight Bearing: Weight bearing as tolerated      Mobility  Bed Mobility Overal bed mobility: Independent                Transfers Overall transfer level: Independent                  Ambulation/Gait Ambulation/Gait assistance: Modified independent (Device/Increase time);Min guard Ambulation Distance (Feet): 250 Feet Assistive device: Rolling walker (2 wheeled);Straight cane       General Gait Details: MOD I with RW half of gait and then return trip ambulated with cane and min/guard with slight unsteadiness, but no LOB  Stairs Stairs:  Yes Stairs assistance: Supervision Stair Management: Two rails;Step to pattern Number of Stairs: 4 General stair comments: Pt needed cues with proper pattern with descent  Wheelchair Mobility    Modified Rankin (Stroke Patients Only)       Balance Overall balance assessment: Needs assistance   Sitting balance-Leahy Scale: Normal       Standing balance-Leahy Scale: Fair                               Pertinent Vitals/Pain Pain Score: 6  Pain Location: "L side kidney area" and R knee Pain Descriptors / Indicators: Aching;Throbbing Pain Intervention(s): Monitored during session;Limited activity within patient's tolerance    Home Living Family/patient expects to be discharged to:: Private residence Living Arrangements: Parent Available Help at Discharge: Family;Available 24 hours/day Type of Home: House Home Access: Stairs to enter Entrance Stairs-Rails: Can reach both Entrance Stairs-Number of Steps: 2 Home Layout: Able to live on main level with bedroom/bathroom Home Equipment: Walker - 2 wheels;Bedside commode;Cane - single point;Crutches;Shower seat Additional Comments: Lives with elderly father who has a live in caregiver, caregiver can A with if needed    Prior Function Level of Independence: Independent               Hand Dominance   Dominant Hand: Right    Extremity/Trunk Assessment   Upper Extremity Assessment: Defer to OT evaluation  Lower Extremity Assessment: Overall WFL for tasks assessed RLE Deficits / Details: R knee AROM in supine -8 to 112 degrees with bulky dressing on    Cervical / Trunk Assessment: Normal  Communication   Communication: No difficulties  Cognition Arousal/Alertness: Awake/alert Behavior During Therapy: WFL for tasks assessed/performed Overall Cognitive Status: Within Functional Limits for tasks assessed                      General Comments      Exercises Total Joint  Exercises Quad Sets: Strengthening;Right;10 reps;Supine Heel Slides: AROM;Right;10 reps;Supine   Assessment/Plan    PT Assessment Patient needs continued PT services  PT Problem List Decreased balance;Decreased mobility          PT Treatment Interventions Gait training;Stair training;Functional mobility training;Balance training    PT Goals (Current goals can be found in the Care Plan section)  Acute Rehab PT Goals Patient Stated Goal: go home, not SNF PT Goal Formulation: With patient Time For Goal Achievement: 01/07/16 Potential to Achieve Goals: Good    Frequency Min 3X/week   Barriers to discharge        Co-evaluation               End of Session Equipment Utilized During Treatment: Gait belt Activity Tolerance: Patient tolerated treatment well Patient left: in chair;with call bell/phone within reach           Time: 0853-0923 PT Time Calculation (min) (ACUTE ONLY): 30 min   Charges:   PT Evaluation $PT Eval Low Complexity: 1 Procedure PT Treatments $Gait Training: 8-22 mins   PT G Codes:        Hayat Warbington LUBECK 01/03/2016, 10:21 AM

## 2016-01-03 NOTE — Evaluation (Signed)
Occupational Therapy Evaluation/Discharge Patient Details Name: Juan Wiggins MRN: [id number] DOB: 11-Feb-1951 Today's Date: 01/03/2016    History of Present Illness 65 y.o. male presenting with right knee pain found to have a septic joint. Pt now s/p irrigation and debridement of R total knee. PMH is significant for hypothyroidism, alcohol abuse, Hepatitis B, Hepatitis C,  HTN, GERD, chronic pain syndrome, hx of TKA (both in 2015), hx of R septic knee (06/2015), major depression, anxiety, BPH.  s/p arthroscopy with i and d on 12/29/15 and again on 01/02/16.   Clinical Impression   PTA, pt reports independence with all ADL and IADL. Pt currently able to complete ADL and functional mobility with modified independence. Educated pt concerning energy conservation, use of ice for pain management, safe shower transfers, and fall prevention strategies and he verbalizes and demonstrates understanding. Pt lives with father who has 49 hour assistance from live-in aide. Pt reports aide will be able to provide assistance if needed following D/C home. Pt reports no further questions or concerns and has no further acute OT needs identified. OT will sign off.    Follow Up Recommendations  No OT follow up;Supervision - Intermittent    Equipment Recommendations  None recommended by OT       Precautions / Restrictions Precautions Precautions: None Restrictions Weight Bearing Restrictions: Yes RLE Weight Bearing: Weight bearing as tolerated      Mobility Bed Mobility               General bed mobility comments: Received in recliner  Transfers Overall transfer level: Independent Equipment used: None                  Balance Overall balance assessment: Needs assistance Sitting-balance support: No upper extremity supported;Feet supported Sitting balance-Leahy Scale: Normal     Standing balance support: No upper extremity supported;During functional activity Standing balance-Leahy Scale:  Fair Standing balance comment: Able to ambulate without UE support. Required single UE support with shower transfer.                            ADL Overall ADL's : Modified independent;At baseline                                       General ADL Comments: Pt able to complete ADL and ADL transfers with and without RW.     Vision Vision Assessment?: No apparent visual deficits          Pertinent Vitals/Pain Pain Assessment: 0-10 Pain Score: 5  Pain Location: R knee Pain Descriptors / Indicators: Aching;Sore Pain Intervention(s): Monitored during session;Repositioned     Hand Dominance Right   Extremity/Trunk Assessment Upper Extremity Assessment Upper Extremity Assessment: Overall WFL for tasks assessed   Lower Extremity Assessment Lower Extremity Assessment: RLE deficits/detail RLE Deficits / Details: Slightly decreased strength and ROM as expected post-operatively.       Communication Communication Communication: No difficulties   Cognition Arousal/Alertness: Awake/alert Behavior During Therapy: WFL for tasks assessed/performed Overall Cognitive Status: Within Functional Limits for tasks assessed                                Home Living Family/patient expects to be discharged to:: Private residence Living Arrangements: Parent Available Help at Discharge: Family;Available 24 hours/day Type  of Home: House Home Access: Stairs to enter [employer] of Steps: 2 Entrance Stairs-Rails: Can reach both Home Layout: Able to live on main level with bedroom/bathroom     Bathroom Shower/Tub: Tub/shower unit Shower/tub characteristics: Curtain Biochemist, clinical: Standard Bathroom Accessibility: Yes   Home Equipment: Environmental consultant - 2 wheels;Cane - single point;Bedside commode;Shower seat;Hand held shower head;Wheelchair - manual;Crutches   Additional Comments: Lives with elderly father who has a live in caregiver, caregiver  can A with if needed      Prior Functioning/Environment Level of Independence: Independent        Comments: Pt reports working full time from home.        OT Problem List: Decreased strength;Decreased range of motion;Impaired balance (sitting and/or standing);Decreased knowledge of use of DME or AE;Pain      OT Goals(Current goals can be found in the care plan section) Acute Rehab OT Goals Patient Stated Goal: go home, not SNF OT Goal Formulation: With patient Time For Goal Achievement: 01/17/16 Potential to Achieve Goals: Good   End of Session Equipment Utilized During Treatment: Gait belt  Activity Tolerance: Patient tolerated treatment well Patient left: in chair;with call bell/phone within reach   Time: 1347-1401 OT Time Calculation (min): 14 min Charges:  OT General Charges $OT Visit: 1 Procedure OT Evaluation $OT Eval Low Complexity: 1 Procedure  Norman Herrlich, OTR/L [phone] 01/03/2016, 2:14 PM

## 2016-01-03 NOTE — Care Management Note (Signed)
Case Management Note  Patient Details  Name: Juan Wiggins MRN: [id number] Date of Birth: 1950/10/31  Subjective/Objective:                    Action/Plan: Case manager has spoken with Family Med Doctor and Dr. Gladstone Lighter concerning patient's discharge plan. Patient has refused SNF and refused to come into short stay for IM injections. Doctors have stated that oral antibiotics will not effectively cover his infection. CM received a call from Dr. Reesa Chew stating patient can go home on IM injections with instruction and support from [facility]. Case manager spoke with [facility] Liaison who states [facility] will not be accepting patient back as a client. CM contacted Tonny Branch, [facility] liaison with referral.   Expected Discharge Date:   12/1315/               Expected Discharge Plan:  [facility]  In-House Referral:     Discharge planning Services  CM Consult  Post Acute Care Choice:  Home Health Choice offered to:     DME Arranged:    DME Agency:     HH Arranged:  RN (RN for IM injections) HH Agency:  [facility]  Status of Service:  In process, will continue to follow  If discussed at Long Length of Stay Meetings, dates discussed:    Additional Comments:  Ninfa Meeker, RN 01/03/2016, 11:08 AM

## 2016-01-03 NOTE — Progress Notes (Addendum)
Subjective: 1 Day Post-Op Procedure(s) (LRB): IRRIGATION AND DEBRIDEMENT RIGHT TOTAL KNEE (Right) Patient reports pain as 2 on 0-10 scale.  Doing much better since second surgery. WBC now normal and he is Afebrile.We are planning on DC on Monday with a PIC line and IV antibiotics depending on Infectious disease consult. Awaiting on [facility] from surgery yesterday.  Objective: Vital signs in last 24 hours: Temp:  [97.9 F (36.6 C)-99.3 F (37.4 C)] 98.6 F (37 C) (11/10 0431) Pulse Rate:  [59-78] 59 (11/10 0431) Resp:  [15-20] 18 (11/10 0431) BP: (119-143)/(53-85) 135/85 (11/10 0431) SpO2:  [93 %-95 %] 94 % (11/10 0431)  Intake/Output from previous day: 11/09 0701 - 11/10 0700 In: 1080 [P.O.:480; I.V.:600] Out: 10 [Blood:10] Intake/Output this shift: No intake/output data recorded.   Recent Labs  12/31/15 0758 01/01/16 0500 01/02/16 0430 01/03/16 0500  HGB 12.7* 12.7* 13.1 12.3*    Recent Labs  01/02/16 0430 01/03/16 0500  WBC 12.9* 10.7*  RBC 3.94* 3.76*  HCT 38.0* 36.4*  PLT 312 293    Recent Labs  01/02/16 0430 01/03/16 0500  NA 136 137  K 3.7 3.9  CL 105 108  CO2 26 23  BUN 7 6  CREATININE 1.24 1.16  GLUCOSE 103* 84  CALCIUM 8.2* 8.0*   No results for input(s): LABPT, INR in the last 72 hours.  Dorsiflexion/Plantar flexion intact  Assessment/Plan: 1 Day Post-Op Procedure(s) (LRB): IRRIGATION AND DEBRIDEMENT RIGHT TOTAL KNEE (Right) Up with therapy Cell:[phone] Riyansh Gerstner A 01/03/2016, 7:29 AM

## 2016-01-03 NOTE — Op Note (Signed)
NAME:  NIM, GELWICKS NO.:  [id number]  MEDICAL RECORD NO.:  [id number]  LOCATION:  5N08C                        FACILITY:  [facility]  PHYSICIAN:  Kipp Brood. Loukisha Gunnerson, M.D.DATE OF BIRTH:  09/01/1950  DATE OF PROCEDURE:  01/02/2016 DATE OF DISCHARGE:                              OPERATIVE REPORT   SURGEON:  Kipp Brood. Gladstone Lighter, M.D.  OPERATIVE ASSISTANT:  Armed forces operational officer p.a.  POSTOP DIAGNOSIS:  Infection of the right total knee, chronic.  This all started out from a spider bite, it became an abscess on his forearm about a year ago.  He became septic and ended up with renal failure.  At that time, he was treated at [facility].  He had one irrigation of his knee in the past followed by a polyethylene exchange in last May and he did extremely well.  Then suddenly, he developed another knee infection.  He was brought in the hospital past Sunday by Dr. Rolena Infante, who did an incision and distal drainage, arthroscopic drainage of his knee.  Cultures showed a few Staph cocci.  He remained basically afebrile with white count was returning down much lower than the preop white count, and he was not septic.  He felt good.  I decided to bring him back again today to do another arthroscopic irrigation of his knee. Initially, the fluid was cloudy, we cultured that.  After that, it was totally clear.  I utilized 6000 mL of irrigation fluid milked his knee around, milked the knee cap.  Up under the popliteal space and compressed the suprapatellar region as well.  All of his remaining fluid was normal.  I did not feel a drain was necessary at this time after seen what we obtained from the knee.  Anyway, I thoroughly irrigated him out and we will keep him in the hospital, wait and see what the sensitivities show on his cultures.  We did take new cultures for aerobic and anaerobic.  The sterile dressings were applied.  The patient left the operating room in satisfactory condition.  He  will be maintained on aspirin 325 mg b.i.d. as an anticoagulant.          ______________________________ Kipp Brood. Gladstone Lighter, M.D.     RAG/MEDQ  D:  01/02/2016  T:  01/03/2016  Job:  [id number]

## 2016-01-03 NOTE — Progress Notes (Signed)
Family Medicine Teaching Service Daily Progress Note Intern Pager: [phone]  Patient name: Juan Wiggins Medical record number: [id number] Date of birth: Oct 01, 1950 Age: 65 y.o. Gender: male  Primary Care Provider: Helane Rima, MD Consultants: Orthopedics Code Status: FULL  Pt Overview and Major Events to Date:  11/5 admit to FPTS, Right knee arthroscopy with irrigation and debridement  11/5 Started on Vancomycin 11/9 repeat I&D  Assessment and Plan: Juan Friedlander Jonesis a 65 y.o.malepresenting with right knee pain found to have a septic joint. PMH is significant for hypothyroidism, alcohol abuse, Hepatitis B, Hepatitis C, HTN, GERD, chronic pain syndrome, hx of TKA (both in 2015), hx of R septic knee (06/2015), major depression, anxiety, BPH.   #Sepsis 2/2 to right knee joint s/p arthroscopy with irrigation and debridement:   Patient presented meeting sepsis criteria secondary to right knee joint infection, taken to the OR for arthrocentesisof the joint. Now day #3 s/p debridement.s/p repeat I&D with ortho yesterday 11/9.  Synovial right knee fluid cx with pan-sensitive Staph aureus.  - Continue cefazolin 2g TID while hospitalized - PICC in place. Patient does not want to go to SNF after discharge due to financial difficulty.  However with history of drug abuse, poor candidate for home with PICC.   -Discussed with case management option of self-administered IM injections with home health nurse visits to ensure proper admin of CTX.  CTX 2g IM daily x 6 weeks.  Case management agrees.  Order placed for  Face-to-face [facility] RN.  Also order placed for RN to educate patient on IM administration while he is hospitalized.  -Per Ortho recs, await 48 hour R knee cx results before likely discharge on Monday.  - Pain control with Oxycodone IR 5-10mg  Q3 prn  - Nausea control with Zofran 4 mg Q6 prn  - PT - no follow up recommended - OT - no needs identified   #Pleural effusion, new.  CT A/P with new  moderate size bilateral pleural effusions with associated bibasilar atelectasis.   Patient without symptoms and VSS.  -Obtain CXR -Monitor New PNA --> tx with CTX for now, per ID recs  #History of drug and alcohol abuse.    Blood ethanol level negative.   States his last drink was 2.5 years ago and history of cocaine and marijuana >40 years ago.  UDS + for opiates, benzos and cocaine.   He did receive Dilaudid in the ED and possible Benzos for surgery.  -Take off CIWA protocol.  Has had scores of 0.  - monitor closely  #Back pain, likely MSK.  Has had surgery on L4-L5 vertebrae in the past by Ortho spine surgeon.  -  Hold flexeril while patient receiving opioids - continue to monitor - CT abdomen ordered for back pain by ortho (see above)  #Lactic acidosis, resolved. Lactate elevated at admission and after surgery, now normalized to 0.8 with fluids -Stop trending LA  #Hypertension. Hypotensive on admission with BP of 94/54, however fluids now resolved as sepsis resolved.  BP has been controlled off of propanolol which was stopped in setting of cocaine use.      - BP this AM controlled at 135/85.  - Hold Propranolol 60 mg daily in setting of his cocaine use.  - monitor BPs.  BPs off Propranolol have been well controlled.   - Consider adding Norvasc if an anti-hypertensive is needed  #Hepatitis B, chronic.At home on Viread 300 mg daily. PT elevated 16. INR normal 1.27.  -Continue home Viread 300  mg daily -Monitor  #Hepatitis C, chronic. At home on Sofosbuvir-Velpatasvir Juan Wiggins).  -Continue home Epclusa 400-100 mg po tab daily -Monitor  #Hypothyroidism. Stable. At home on Levothyroxine. Last TSH 13.8 (07/19/2015).  -Continue home Synthroid 200 mcg daily -repeat TSH 0.206 (low)  #difficulty sleeping. At home on Ambien 10 mg at bedtime.  - Ambien 5mg  PRN  #Depression . Stable. At home on Lexapro and Remeron. Also on Ambien 10mg  PRN  -Continue Lexapro 20 mg daily  at bedtime - Remeron   #BPH: - continue home Rapaflo   FEN/GI:  heart healthy, IV NS @ 75 cc/hr Prophylaxis: SCDs, will have to ask surgery regarding prophylaxis   Disposition: Likely discharge home Monday.   Subjective:  Patient had no acute events overnight. States pain is well controlled.  He would like a home health nurse to come home instead of going to clinic.  Refusing SNF at this time as well. No other concerns at this time.      Objective: Temp:  [97.9 F (36.6 C)-99.3 F (37.4 C)] 98.6 F (37 C) (11/10 0431) Pulse Rate:  [59-76] 59 (11/10 0431) Resp:  [15-20] 18 (11/10 0431) BP: (119-139)/(53-85) 135/85 (11/10 0431) SpO2:  [93 %-95 %] 94 % (11/10 0431) Physical Exam: General: NAD, rests comfortably in bed, pleasant Cardiovascular: RRR, no m/r/g Respiratory: CTA bil no W/R/R Abdomen: soft and nontender, NT, +bs Extremities: +bandage in place over R knee, site clean and dry.  Psych: mood appropriate  Laboratory:  Recent Labs Lab 01/01/16 0500 01/02/16 0430 01/03/16 0500  WBC 12.3* 12.9* 10.7*  HGB 12.7* 13.1 12.3*  HCT 37.0* 38.0* 36.4*  PLT 268 312 293    Recent Labs Lab 12/29/15 1119 12/30/15 0432  01/01/16 0500 01/02/16 0430 01/03/16 0500  NA 134* 136  < > 135 136 137  K 4.3 4.5  < > 3.8 3.7 3.9  CL 103 106  < > 107 105 108  CO2 23 25  < > 23 26 23   BUN 10 9  < > 10 7 6   CREATININE 1.51* 1.40*  < > 1.32* 1.24 1.16  CALCIUM 8.4* 7.8*  < > 8.2* 8.2* 8.0*  PROT 6.4* 5.7*  --   --   --   --   BILITOT 1.8* 1.3*  --   --   --   --   ALKPHOS 130* 104  --   --   --   --   ALT 31 26  --   --   --   --   AST 51* 41  --   --   --   --   GLUCOSE 104* 121*  < > 91 103* 84  < > = values in this interval not displayed. Imaging/Diagnostic Tests: Dg Chest Portable 1 View  12/29/2015 FINDINGS: The heart size and mediastinal contours are within normal limits. Both lungs are clear. The visualized skeletal structures are unremarkable. Remote left inferior  rib fracture with nonunion.  IMPRESSION: Stable chest exam.  No superimposed acute process.   Dg Hip Unilat With Pelvis 2-3 Views Left - 12/30/2015 FINDINGS: Frontal pelvis as well as frontal and lateral left hip images were obtained. There is no fracture or dislocation. There is slight symmetric narrowing of both hip joints. No erosive change or bony destruction.  IMPRESSION: Slight symmetric narrowing both hip joints. No acute fracture or dislocation.   [name], MD 01/03/2016, 1:34 PM PGY-1, [place] Intern pager: [phone], text pages welcome

## 2016-01-04 ENCOUNTER — Inpatient Hospital Stay (HOSPITAL_COMMUNITY): Payer: 59

## 2016-01-04 DIAGNOSIS — M545 Low back pain, unspecified: Secondary | ICD-10-CM

## 2016-01-04 DIAGNOSIS — R1012 Left upper quadrant pain: Secondary | ICD-10-CM

## 2016-01-04 DIAGNOSIS — Z96659 Presence of unspecified artificial knee joint: Secondary | ICD-10-CM

## 2016-01-04 DIAGNOSIS — T8459XS Infection and inflammatory reaction due to other internal joint prosthesis, sequela: Secondary | ICD-10-CM

## 2016-01-04 DIAGNOSIS — A4101 Sepsis due to Methicillin susceptible Staphylococcus aureus: Secondary | ICD-10-CM

## 2016-01-04 DIAGNOSIS — R262 Difficulty in walking, not elsewhere classified: Secondary | ICD-10-CM

## 2016-01-04 LAB — CBC
HEMATOCRIT: 38 % — AB (ref 39.0–52.0)
HEMOGLOBIN: 13 g/dL (ref 13.0–17.0)
MCH: 32.9 pg (ref 26.0–34.0)
MCHC: 34.2 g/dL (ref 30.0–36.0)
MCV: 96.2 fL (ref 78.0–100.0)
Platelets: 367 10*3/uL (ref 150–400)
RBC: 3.95 MIL/uL — ABNORMAL LOW (ref 4.22–5.81)
RDW: 15.3 % (ref 11.5–15.5)
WBC: 12.8 10*3/uL — AB (ref 4.0–10.5)

## 2016-01-04 LAB — BASIC METABOLIC PANEL
ANION GAP: 6 (ref 5–15)
BUN: 6 mg/dL (ref 6–20)
CALCIUM: 8.1 mg/dL — AB (ref 8.9–10.3)
CHLORIDE: 107 mmol/L (ref 101–111)
CO2: 27 mmol/L (ref 22–32)
Creatinine, Ser: 1.29 mg/dL — ABNORMAL HIGH (ref 0.61–1.24)
GFR calc Af Amer: >60 mL/min (ref >60)
GFR calc non Af Amer: 57 mL/min — ABNORMAL LOW (ref >60)
GLUCOSE: 92 mg/dL (ref 65–99)
POTASSIUM: 3.7 mmol/L (ref 3.5–5.1)
Sodium: 140 mmol/L (ref 135–145)

## 2016-01-04 MED ORDER — GADOBENATE DIMEGLUMINE 529 MG/ML IV SOLN
20.0000 mL | Freq: Once | INTRAVENOUS | Status: AC | PRN
  Administered 2016-01-04: 20 mL via INTRAVENOUS

## 2016-01-04 MED ORDER — OXYCODONE HCL 5 MG PO TABS
5.0000 mg | ORAL_TABLET | ORAL | Status: DC | PRN
  Administered 2016-01-04 – 2016-01-06 (×12): 10 mg via ORAL
  Filled 2016-01-04 (×12): qty 2

## 2016-01-04 MED ORDER — HYDROMORPHONE HCL 2 MG/ML IJ SOLN
1.0000 mg | Freq: Once | INTRAMUSCULAR | Status: AC
  Administered 2016-01-04: 1 mg via INTRAVENOUS
  Filled 2016-01-04: qty 1

## 2016-01-04 MED ORDER — HYDROMORPHONE HCL 2 MG/ML IJ SOLN: 2.0000 mg | Freq: Once | INTRAMUSCULAR | Status: DC

## 2016-01-04 NOTE — Progress Notes (Signed)
   Subjective: 2 Days Post-Op Procedure(s) (LRB): IRRIGATION AND DEBRIDEMENT RIGHT TOTAL KNEE (Right) Patient reports pain as mild.   Patient seen in rounds with Dr. Gladstone Lighter Patient is doing well in regards to the knee but is still having progressively worsening pain in the low back. No SOB or chest pain.    Objective: Vital signs in last 24 hours: Temp:  [98.4 F (36.9 C)-99.7 F (37.6 C)] 98.9 F (37.2 C) (11/11 0502) Pulse Rate:  [65-71] 71 (11/11 0502) Resp:  [16] 16 (11/11 0502) BP: (134-144)/(66-73) 144/66 (11/11 0502) SpO2:  [95 %-97 %] 97 % (11/11 0502)   Labs:  Recent Labs  01/02/16 0430 01/03/16 0500 01/04/16 0414  HGB 13.1 12.3* 13.0    Recent Labs  01/03/16 0500 01/04/16 0414  WBC 10.7* 12.8*  RBC 3.76* 3.95*  HCT 36.4* 38.0*  PLT 293 367    Recent Labs  01/03/16 0500 01/04/16 0414  NA 137 140  K 3.9 3.7  CL 108 107  CO2 23 27  BUN 6 6  CREATININE 1.16 1.29*  GLUCOSE 84 92  CALCIUM 8.0* 8.1*    EXAM General - Patient is Alert and Oriented Extremity - Neurologically intact Intact pulses distally Dorsiflexion/Plantar flexion intact No cellulitis present Compartment soft Dressing/Incision - clean, dry, no drainage Motor Function - intact, moving foot and toes well on exam.   Past Medical History:  Diagnosis Date  . Anxiety   . Arthritis    RHEUMATOID ARTHRITIS; OA LEFT KNEE  . Cancer (HCC)    MELANOMA REMOVED RT SHOULDER  . Depression   . GERD (gastroesophageal reflux disease)    PREVACID IF NEEDED  . Heart murmur   . Hemorrhoids   . Hepatitis A    PT STATES TYPE OF HEPATITIS YOU GET FROM SHELLFISH  . Hepatitis B   . Hepatitis C   . Hypertension   . Hypothyroidism   . 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 Pancreatitis, alcoholic, acute   . Sleep apnea    CLAUSTROPHOBIC - COULD NOT TOLERATE CPAP MASK  . Tachycardia    PT STATES HIS HEART RATE USUALLY 100 OR MORE  . Thyroid disease      Assessment/Plan: 2 Days Post-Op Procedure(s) (LRB): IRRIGATION AND DEBRIDEMENT RIGHT TOTAL KNEE (Right) Active Problems:   Sepsis ([place])   Lactic acidosis   Pain   Hepatitis B infection without delta agent without hepatic coma   Left upper quadrant pain   Pleural effusion   Infection of total knee replacement (HCC)  Estimated body mass index is 27.21 kg/m as calculated from the following:   Height as of this encounter: 5\' 11"  (1.803 m).   Weight as of this encounter: 88.5 kg (195 lb 1.7 oz). Advance diet Up with therapy  DVT Prophylaxis - Aspirin Weight-Bearing as tolerated   Will continue working with therapy. Begin ROM on right knee. Will get MRI of lumbar spine due to progressive low back pain. Will continue to watch WBC and cultures. Likely DC home Monday with IM ceftriaxone.   Ardeen Jourdain, PA-C Orthopaedic Surgery 01/04/2016, 8:10 AM

## 2016-01-04 NOTE — Progress Notes (Signed)
Family Medicine Teaching Service Daily Progress Note Intern Pager: [phone]  Patient name: Juan Wiggins Medical record number: [id number] Date of birth: [birth year] Age: 65 y.o. Gender: male  Primary Care Provider: Helane Rima, MD Consultants: Orthopedics Code Status: FULL  Pt Overview and Major Events to Date:  11/5 admit to FPTS, Right knee arthroscopy with irrigation and debridement  11/5 Started on Vancomycin 11/9 repeat I&D  Assessment and Plan: Juan Wiggins a 65 y.o.malepresenting with right knee pain found to have a septic joint. PMH is significant for hypothyroidism, alcohol abuse, Hepatitis B, Hepatitis C, HTN, GERD, chronic pain syndrome, hx of TKA (both in 2015), hx of R septic knee (06/2015), major depression, anxiety, BPH.    #Sepsis 2/2 to right knee joint s/p arthroscopy with irrigation and debridement:   Patient presented meeting sepsis criteria secondary to right knee joint infection, taken to the OR for arthrocentesisof the joint. Now day #3 s/p debridement.s/p repeat I&D with ortho 11/9.  Synovial right knee fluid cx with pan-sensitive Staph aureus.  - Continue cefazolin 2g TID while hospitalized - PICC in place, will need to be removed prior to discharge. Patient does not want to go to SNF after discharge due to financial difficulty.  However with history of drug abuse, poor candidate for home with PICC.  -Discussed with case management option of self-administered IM injections with home health nurse visits to ensure proper admin of CTX.  CTX 2g IM daily x 6 weeks.  Case management agrees and has reached out to [facility] service.  Order placed for  Face-to-face [facility] RN.  Pending confirmation of service.  Also order placed for RN to educate patient on IM administration while he is hospitalized.  -Per Ortho recs, await 48 hour R knee cx results before likely discharge on Monday.  - Pain control with Oxycodone IR 5-10mg  Q3 prn - Nausea control with Zofran 4 mg Q6 prn  -  PT - no follow up recommended - OT - no needs identified   #Pleural effusion, new.  CT A/P with new moderate size bilateral pleural effusions with associated bibasilar atelectasis.   Patient without symptoms and VSS.  -Obtain CXR - P:  tx with CTX for now, per ID recs   #History of drug and alcohol abuse.    Blood ethanol level negative.   States his last drink was 2.5 years ago and history of cocaine and marijuana >40 years ago.  UDS + for opiates, benzos and cocaine.   He did receive Dilaudid in the ED and possible Benzos for surgery.  -Take off CIWA protocol.  Has had scores of 0.  - monitor closely  #Back pain, likely MSK.  Has had surgery on L4-L5 vertebrae in the past by Ortho spine surgeon.  -  Hold flexeril while patient receiving opioids -MRI lumbar spine w and wo contrast, per Ortho - continue to monitor  #Lactic acidosis, resolved. Lactate elevated at admission and after surgery, now normalized to 0.8 with fluids -Stop trending LA  #Hypertension. Hypotensive on admission with BP of 94/54, however fluids now resolved as sepsis resolved.  BP has been controlled off of propanolol which was stopped in setting of cocaine use.      - BP this AM controlled at 144/66.  - Hold Propranolol 60 mg daily in setting of his cocaine use.  - monitor BPs.  BPs off Propranolol have been well controlled.   - Consider adding Norvasc if an anti-hypertensive is needed  #Hepatitis B, chronic.At home  on Viread 300 mg daily. PT elevated 16. INR normal 1.27.  -Continue home Viread 300 mg daily -Monitor  #Hepatitis C, chronic. At home on Sofosbuvir-Velpatasvir [name]).  -Continue home Epclusa 400-100 mg po tab daily -Monitor  #Hypothyroidism. Stable. At home on Levothyroxine. Last TSH 13.8 (07/19/2015).  -Continue home Synthroid 200 mcg daily -repeat TSH 0.206 (low)  #difficulty sleeping. At home on Ambien 10 mg at bedtime.  - Ambien 5mg  PRN  #Depression . Stable. At home on  Lexapro and Remeron. Also on Ambien 10mg  PRN  -Continue Lexapro 20 mg daily at bedtime - Remeron   #BPH: - continue home Rapaflo   FEN/GI:  heart healthy, IV NS @ 75 cc/hr Prophylaxis: SCDs, will have to ask surgery regarding prophylaxis   Disposition: Likely discharge home Monday.   Subjective:  Patient had no acute events overnight. States pain is well controlled. Is waiting to go down for MRI of his lumbar spine.  Agreed to self-administer IM injections at home for antibiotics over the next 6 weeks. No other concerns at this time.    Objective: Temp:  [98.4 F (36.9 C)-99.7 F (37.6 C)] 98.9 F (37.2 C) (11/11 0502) Pulse Rate:  [65-71] 71 (11/11 0502) Resp:  [16] 16 (11/11 0502) BP: (134-144)/(66-73) 144/66 (11/11 0502) SpO2:  [95 %-97 %] 97 % (11/11 0502) Physical Exam: General: NAD, rests comfortably in bed Cardiovascular: RRR, no m/r/g Respiratory: CTA bil no W/R/R Abdomen: soft and nontender, NT, +bs Extremities: +bandage in place over R knee, site clean and dry.  Psych: mood appropriate  Laboratory:  Recent Labs Lab 01/02/16 0430 01/03/16 0500 01/04/16 0414  WBC 12.9* 10.7* 12.8*  HGB 13.1 12.3* 13.0  HCT 38.0* 36.4* 38.0*  PLT 312 293 367    Recent Labs Lab 12/29/15 1119 12/30/15 0432  01/02/16 0430 01/03/16 0500 01/04/16 0414  NA 134* 136  < > 136 137 140  K 4.3 4.5  < > 3.7 3.9 3.7  CL 103 106  < > 105 108 107  CO2 23 25  < > 26 23 27   BUN 10 9  < > 7 6 6   CREATININE 1.51* 1.40*  < > 1.24 1.16 1.29*  CALCIUM 8.4* 7.8*  < > 8.2* 8.0* 8.1*  PROT 6.4* 5.7*  --   --   --   --   BILITOT 1.8* 1.3*  --   --   --   --   ALKPHOS 130* 104  --   --   --   --   ALT 31 26  --   --   --   --   AST 51* 41  --   --   --   --   GLUCOSE 104* 121*  < > 103* 84 92  < > = values in this interval not displayed. Imaging/Diagnostic Tests: Dg Chest Portable 1 View  12/29/2015 FINDINGS: The heart size and mediastinal contours are within normal limits. Both  lungs are clear. The visualized skeletal structures are unremarkable. Remote left inferior rib fracture with nonunion.  IMPRESSION: Stable chest exam.  No superimposed acute process.   Dg Hip Unilat With Pelvis 2-3 Views Left - 12/30/2015 FINDINGS: Frontal pelvis as well as frontal and lateral left hip images were obtained. There is no fracture or dislocation. There is slight symmetric narrowing of both hip joints. No erosive change or bony destruction.  IMPRESSION: Slight symmetric narrowing both hip joints. No acute fracture or dislocation.   [name], MD 01/04/2016,  11:29 AM PGY-1, [place] Intern pager: [phone], text pages welcome

## 2016-01-05 LAB — BASIC METABOLIC PANEL
ANION GAP: 6 (ref 5–15)
BUN: 7 mg/dL (ref 6–20)
CHLORIDE: 106 mmol/L (ref 101–111)
CO2: 26 mmol/L (ref 22–32)
Calcium: 8.3 mg/dL — ABNORMAL LOW (ref 8.9–10.3)
Creatinine, Ser: 1.45 mg/dL — ABNORMAL HIGH (ref 0.61–1.24)
GFR, EST AFRICAN AMERICAN: 58 mL/min — AB (ref >60)
GFR, EST NON AFRICAN AMERICAN: 50 mL/min — AB (ref >60)
Glucose, Bld: 88 mg/dL (ref 65–99)
POTASSIUM: 4 mmol/L (ref 3.5–5.1)
SODIUM: 138 mmol/L (ref 135–145)

## 2016-01-05 LAB — CBC
HCT: 37.8 % — ABNORMAL LOW (ref 39.0–52.0)
HEMOGLOBIN: 13.1 g/dL (ref 13.0–17.0)
MCH: 33.2 pg (ref 26.0–34.0)
MCHC: 34.7 g/dL (ref 30.0–36.0)
MCV: 95.7 fL (ref 78.0–100.0)
PLATELETS: 364 10*3/uL (ref 150–400)
RBC: 3.95 MIL/uL — AB (ref 4.22–5.81)
RDW: 15.1 % (ref 11.5–15.5)
WBC: 11.6 10*3/uL — AB (ref 4.0–10.5)

## 2016-01-05 MED ORDER — METHOCARBAMOL 500 MG PO TABS: 500.0000 mg | ORAL_TABLET | Freq: Four times a day (QID) | ORAL | 1 refills | Status: DC | PRN

## 2016-01-05 MED ORDER — OXYCODONE HCL 5 MG PO TABS: 5.0000 mg | ORAL_TABLET | ORAL | 0 refills | Status: DC | PRN

## 2016-01-05 NOTE — Progress Notes (Signed)
Patient ID: Juan Wiggins, male   DOB: September 10, 1950, 65 y.o.   MRN: [id number]  Subjective: 3 Days Post-Op Procedure(s) (LRB): IRRIGATION AND DEBRIDEMENT RIGHT TOTAL KNEE (Right)    Patient reports pain as mild.  Doing well.  Reviewed case/procedures with him and plans as outlined by Gioffre.  Objective:   VITALS:   Vitals:   01/04/16 2035 01/05/16 0452  BP: (!) 143/64 (!) 141/65  Pulse: 61 60  Resp: 18 18  Temp: 98.9 F (37.2 C) 98.6 F (37 C)    Neurovascular intact Incision: no drainage, portal sites healing without issue  LABS  Recent Labs  01/03/16 0500 01/04/16 0414 01/05/16 0415  HGB 12.3* 13.0 13.1  HCT 36.4* 38.0* 37.8*  WBC 10.7* 12.8* 11.6*  PLT 293 367 364     Recent Labs  01/03/16 0500 01/04/16 0414 01/05/16 0415  NA 137 140 138  K 3.9 3.7 4.0  BUN 6 6 7   CREATININE 1.16 1.29* 1.45*  GLUCOSE 84 92 88    No results for input(s): LABPT, INR in the last 72 hours.   Assessment/Plan: 3 Days Post-Op Procedure(s) (LRB): IRRIGATION AND DEBRIDEMENT RIGHT TOTAL KNEE (Right)   Plan for discharge tomorrow per [facility] Final cultures still pending Antibiotic selection per cultures and Gioffre Activity ad lib

## 2016-01-05 NOTE — Discharge Instructions (Signed)
Walk with your walker. Weight bearing as tolerated. Shower only, no tub bath. Call if any temperatures greater than 101 or any wound complications: [phone] during the day and ask for Dr. Charlestine Night nurse, Brunilda Payor.

## 2016-01-05 NOTE — Progress Notes (Signed)
Family Medicine Teaching Service Daily Progress Note Intern Pager: [phone]  Patient name: Juan Wiggins Medical record number: [id number] Date of birth: [birth year] Age: 65 y.o. Gender: male  Primary Care Provider: Helane Rima, MD Consultants: Orthopedics Code Status: FULL  Pt Overview and Major Events to Date:  11/5 admit to FPTS, Right knee arthroscopy with irrigation and debridement  11/5 Started on Vancomycin 11/9 repeat I&D  Assessment and Plan: Juan Blewitt Jonesis a 65 y.o.malepresenting with right knee pain found to have a septic joint. PMH is significant for hypothyroidism, alcohol abuse, Hepatitis B, Hepatitis C, HTN, GERD, chronic pain syndrome, hx of TKA (both in 2015), hx of R septic knee (06/2015), major depression, anxiety, BPH.    #Right septic Knee s/p arthroscopy with irrigation and debridement:   Patient presented meeting sepsis criteria secondary to right knee joint infection, taken to the OR for arthrocentesisof the joint. Now day #3 s/p debridement x2.Synovial right knee fluid cx with pan-sensitive Staph aureus.  - Continue cefazolin 2g TID while hospitalized - PICC in place, will need to be removed prior to discharge. With history of drug abuse, poor candidate for home with PICC.  -Will discharge home with self-administered IM injections with home health nurse visits to ensure proper admin of CTX.  CTX 2g IM daily x 6 weeks.   -Per Ortho recs, await 48 hour R knee cx results before likely discharge on Monday.  - Pain control with Oxycodone IR 5-10mg  Q3 prn - Nausea control with Zofran 4 mg Q6 prn  - PT - no follow up recommended - OT - no needs identified   #Pleural effusion, new.  CT A/P with new moderate size bilateral pleural effusions with associated bibasilar atelectasis.   Patient without symptoms and VSS. Per ID recs just monitor. No treatment at this time.  #History of drug and alcohol abuse.    Blood ethanol level negative.   States his last drink was  2.5 years ago and history of cocaine and marijuana >40 years ago.  UDS + for opiates, benzos and cocaine. CIWA was discontinued earlier in hospitalization with scores of 0.   #Back pain, likely MSK.  Has had surgery on L4-L5 vertebrae in the past by Ortho spine surgeon. MR lumbar spine yesterday showed seroma around surgical site and disc protrusion with severe L facet arthropathy -  Hold flexeril while patient receiving opioids - f/u ortho recs on lumbar spine film - continue to monitor  #Lactic acidosis, resolved. Lactate elevated at admission and after surgery, now normalized to 0.8 with fluids  #Hypertension. Controlled   - Hold Propranolol 60 mg daily in setting of his cocaine use.  - monitor BPs.  BPs off Propranolol have been well controlled.   - Consider adding Norvasc if an anti-hypertensive is needed  #Hepatitis B, chronic.At home on Viread 300 mg daily.  -Continue home Viread 300 mg daily -Monitor  #Hepatitis C, chronic. At home on Sofosbuvir-Velpatasvir [name]).  -Continue home Epclusa 400-100 mg po tab daily -Monitor  #Hypothyroidism. Stable. At home on Levothyroxine. Last TSH 13.8 (07/19/2015).  -Continue home Synthroid 200 mcg daily -repeat TSH 0.206 (low)  #difficulty sleeping. At home on Ambien 10 mg at bedtime.  - Ambien 5mg  PRN  #Depression . Stable. At home on Lexapro and Remeron. Also on Ambien 10mg  PRN  -Continue Lexapro 20 mg daily at bedtime - Remeron   #BPH: - continue home Rapaflo   FEN/GI:  heart healthy, KVO Prophylaxis: SCDs  Disposition: Likely discharge home Monday.  Subjective:  Patient had no acute events overnight. Had MR of lumbar spine yesterday. Endorsing some low back pain. Otherwise no other complaints.    Objective: Temp:  [98.1 F (36.7 C)-98.9 F (37.2 C)] 98.6 F (37 C) (11/12 0452) Pulse Rate:  [60-82] 60 (11/12 0452) Resp:  [18] 18 (11/12 0452) BP: (141-143)/(64-65) 141/65 (11/12 0452) SpO2:  [96 %-98  %] 97 % (11/12 0452) Physical Exam: General: NAD, standing in room chatting on phone Cardiovascular: RRR, no m/r/g Respiratory: CTA bil no W/R/R Abdomen: soft and nontender, ND, +bs Extremities: R knee with c/d/i incision sites, R knee more swollen than left Neuro: non-focal Psych: mood appropriate  Laboratory:  Recent Labs Lab 01/03/16 0500 01/04/16 0414 01/05/16 0415  WBC 10.7* 12.8* 11.6*  HGB 12.3* 13.0 13.1  HCT 36.4* 38.0* 37.8*  PLT 293 367 364    Recent Labs Lab 12/29/15 1119 12/30/15 0432  01/03/16 0500 01/04/16 0414 01/05/16 0415  NA 134* 136  < > 137 140 138  K 4.3 4.5  < > 3.9 3.7 4.0  CL 103 106  < > 108 107 106  CO2 23 25  < > 23 27 26   BUN 10 9  < > 6 6 7   CREATININE 1.51* 1.40*  < > 1.16 1.29* 1.45*  CALCIUM 8.4* 7.8*  < > 8.0* 8.1* 8.3*  PROT 6.4* 5.7*  --   --   --   --   BILITOT 1.8* 1.3*  --   --   --   --   ALKPHOS 130* 104  --   --   --   --   ALT 31 26  --   --   --   --   AST 51* 41  --   --   --   --   GLUCOSE 104* 121*  < > 84 92 88  < > = values in this interval not displayed. Imaging/Diagnostic Tests: Dg Chest Portable 1 View  12/29/2015 FINDINGS: The heart size and mediastinal contours are within normal limits. Both lungs are clear. The visualized skeletal structures are unremarkable. Remote left inferior rib fracture with nonunion.  IMPRESSION: Stable chest exam.  No superimposed acute process.   Dg Hip Unilat With Pelvis 2-3 Views Left - 12/30/2015 FINDINGS: Frontal pelvis as well as frontal and lateral left hip images were obtained. There is no fracture or dislocation. There is slight symmetric narrowing of both hip joints. No erosive change or bony destruction.  IMPRESSION: Slight symmetric narrowing both hip joints. No acute fracture or dislocation.   Katheren Shams, DO 01/05/2016, 8:29 AM PGY-3, [place] Intern pager: [phone], text pages welcome

## 2016-01-05 NOTE — Progress Notes (Signed)
Physical Therapy Treatment and Discharge Patient Details Name: LENARDO WESTWOOD MRN: [id number] DOB: [birth year] Today's Date: 01/05/2016    History of Present Illness 65 y.o. male presenting with right knee pain found to have a septic joint. Pt now s/p irrigation and debridement of R total knee. PMH is significant for hypothyroidism, alcohol abuse, Hepatitis B, Hepatitis C,  HTN, GERD, chronic pain syndrome, hx of TKA (both in 2015), hx of R septic knee (06/2015), major depression, anxiety, BPH.  s/p arthroscopy with i and d on 12/29/15 and again on 01/02/16.    PT Comments    Very good use of cane and overall walking well; good return demonstration of therex as well; Acute PT goals met; Will sign off  Follow Up Recommendations  The potential need for Outpatient PT can be addressed at Ortho follow-up appointments.      Equipment Recommendations  None recommended by PT    Recommendations for Other Services       Precautions / Restrictions Precautions Precautions: None Restrictions RLE Weight Bearing: Weight bearing as tolerated    Mobility  Bed Mobility Overal bed mobility: Independent                Transfers Overall transfer level: Independent                  Ambulation/Gait Ambulation/Gait assistance: Modified independent (Device/Increase time) Ambulation Distance (Feet): 400 Feet Assistive device: Straight cane       General Gait Details: Good use of cane to stability prn; noted one loss of balance during substantial walk from which he recovered independently   Stairs            Wheelchair Mobility    Modified Rankin (Stroke Patients Only)       Balance             Standing balance-Leahy Scale: Good                      Cognition Arousal/Alertness: Awake/alert Behavior During Therapy: WFL for tasks assessed/performed Overall Cognitive Status: Within Functional Limits for tasks assessed                       Exercises Total Joint Exercises Quad Sets: Strengthening;Right;10 reps;Supine Heel Slides: AROM;Right;10 reps;Supine    General Comments        Pertinent Vitals/Pain Pain Assessment: 0-10 Pain Score: 6  Pain Location: R knee Pain Descriptors / Indicators: Aching;Sore Pain Intervention(s): Monitored during session    Home Living                      Prior Function            PT Goals (current goals can now be found in the care plan section) Acute Rehab PT Goals Patient Stated Goal: go home, not SNF Progress towards PT goals: Goals met/education completed, patient discharged from PT    Frequency    Min 3X/week      PT Plan Current plan remains appropriate    Co-evaluation             End of Session Equipment Utilized During Treatment: Gait belt Activity Tolerance: Patient tolerated treatment well Patient left: in bed;with call bell/phone within reach;with family/visitor present     Time: 0177-9390 PT Time Calculation (min) (ACUTE ONLY): 18 min  Charges:  $Gait Training: 8-22 mins  G Codes:      Roney Marion Hamff 01/05/2016, 5:10 PM   Roney Marion, [place]  Acute Rehabilitation Services Pager [phone] Office [phone]

## 2016-01-06 MED ORDER — BISACODYL 5 MG PO TBEC: 5.0000 mg | DELAYED_RELEASE_TABLET | Freq: Every day | ORAL | 0 refills | Status: DC | PRN

## 2016-01-06 MED ORDER — CEFTRIAXONE SODIUM 1 G IJ SOLR: 2.0000 g | INTRAMUSCULAR | 0 refills | Status: DC

## 2016-01-06 NOTE — Care Management (Signed)
Case manager has confirmed with [facility] Liaison that patient will be provided [facility] for medication administration. Dian Situ states they will see patient. He will get the order for Ceftrioxone that will be administered to the patient. Patient's bedisde RN has explained administration technique to patient and he states he has administered medication  injections to himself in the past.

## 2016-01-06 NOTE — Progress Notes (Signed)
Family Medicine Teaching Service Daily Progress Note Intern Pager: [phone]  Patient name: Juan Wiggins Medical record number: [id number] Date of birth: [birth year] Age: 65 y.o. Gender: male  Primary Care Provider: Helane Rima, MD Consultants: Orthopedics Code Status: FULL  Pt Overview and Major Events to Date:  11/5 admit to FPTS, Right knee arthroscopy with irrigation and debridement  11/5 Started on Vancomycin 11/9 repeat I&D  Assessment and Plan: Juan Wiggins Juan Wiggins a 65 y.o.malepresenting with right knee pain found to have a septic joint. PMH is significant for hypothyroidism, alcohol abuse, Hepatitis B, Hepatitis C, HTN, GERD, chronic pain syndrome, hx of TKA (both in 2015), hx of R septic knee (06/2015), major depression, anxiety, BPH.    #Right septic Knee s/p arthroscopy with irrigation and debridement:   Patient presented meeting sepsis criteria secondary to right knee joint infection, taken to the OR for arthrocentesisof the joint. Now s/p debridement x2.Synovial right knee fluid cx with pan-sensitive Staph aureus.  - Continue cefazolin 2g TID while hospitalized  -Will discharge home with self-administered IM injections with home health nurse visits to ensure proper admin of CTX.  CTX 2g IM daily x 6 weeks.   -Knee cx NGx48 hours - Pain control with Oxycodone IR 5-10mg  Q3 prn - Nausea control with Zofran 4 mg Q6 prn  - PT - no follow up recommended - OT - no needs identified   #Pleural effusion, new.  CT A/P with new moderate size bilateral pleural effusions with associated bibasilar atelectasis.   Patient without symptoms and VSS. Per ID recs just monitor. No treatment at this time.  #History of drug and alcohol abuse.    Blood ethanol level negative.   States his last drink was 2.5 years ago and history of cocaine and marijuana >40 years ago.  UDS + for opiates, benzos and cocaine. CIWA was discontinued earlier in hospitalization with scores of 0.   #Back pain, likely  MSK.  Has had surgery on L4-L5 vertebrae in the past by Ortho spine surgeon. MR lumbar spine yesterday showed seroma around surgical site and disc protrusion with severe L facet arthropathy -  Hold flexeril while patient receiving opioids  - f/u ortho recs on lumbar spine film - continue to monitor  #Lactic acidosis, resolved. Lactate elevated at admission and after surgery, now normalized to 0.8 with fluids  #Hypertension. Controlled   - Hold Propranolol 60 mg daily in setting of his cocaine use.  - monitor BPs.  BPs off Propranolol have been well controlled.   - Consider adding Norvasc if an anti-hypertensive is needed  #Hepatitis B, chronic.At home on Viread 300 mg daily.  -Continue home Viread 300 mg daily -Monitor  #Hepatitis C, chronic. At home on Sofosbuvir-Velpatasvir Juan Wiggins).  -Continue home Epclusa 400-100 mg po tab daily -Monitor  #Hypothyroidism. Stable. At home on Levothyroxine. Last TSH 13.8 (07/19/2015).  -Continue home Synthroid 200 mcg daily -repeat TSH 0.206 (low)  #difficulty sleeping. At home on Ambien 10 mg at bedtime.  - Ambien 5mg  PRN  #Depression . Stable. At home on Lexapro and Remeron. Also on Ambien 10mg  PRN  -Continue Lexapro 20 mg daily at bedtime - Remeron   #BPH: - continue home Rapaflo   FEN/GI:  heart healthy, KVO Prophylaxis: SCDs  Disposition: Discharge home today  Subjective:  Patient had no acute events overnight.  Ready for discharge home.  No concerns today.  Will have RN teach him how to self-administer IM injections for use at home.   Objective: Temp:  [  99 F (37.2 C)-99.2 F (37.3 C)] 99 F (37.2 C) (11/13 1231) Pulse Rate:  [63-73] 73 (11/13 1231) Resp:  [16-17] 16 (11/13 1231) BP: (157)/(84-88) 157/88 (11/13 1231) SpO2:  [96 %-98 %] 98 % (11/13 1231) Physical Exam: General: NAD, laying in hospital bed Cardiovascular: RRR, no m/r/g Respiratory: CTA bil no W/R/R Abdomen: soft and nontender, ND,  +bs Extremities: R knee with c/d/i incision sites Neuro: non-focal Psych: mood appropriate  Laboratory:  Recent Labs Lab 01/03/16 0500 01/04/16 0414 01/05/16 0415  WBC 10.7* 12.8* 11.6*  HGB 12.3* 13.0 13.1  HCT 36.4* 38.0* 37.8*  PLT 293 367 364    Recent Labs Lab 01/03/16 0500 01/04/16 0414 01/05/16 0415  NA 137 140 138  K 3.9 3.7 4.0  CL 108 107 106  CO2 23 27 26   BUN 6 6 7   CREATININE 1.16 1.29* 1.45*  CALCIUM 8.0* 8.1* 8.3*  GLUCOSE 84 92 88   Imaging/Diagnostic Tests: Dg Chest Portable 1 View  12/29/2015 FINDINGS: The heart size and mediastinal contours are within normal limits. Both lungs are clear. The visualized skeletal structures are unremarkable. Remote left inferior rib fracture with nonunion.  IMPRESSION: Stable chest exam.  No superimposed acute process.   Dg Hip Unilat With Pelvis 2-3 Views Left - 12/30/2015 FINDINGS: Frontal pelvis as well as frontal and lateral left hip images were obtained. There is no fracture or dislocation. There is slight symmetric narrowing of both hip joints. No erosive change or bony destruction.  IMPRESSION: Slight symmetric narrowing both hip joints. No acute fracture or dislocation.   Juan Kim, MD 01/06/2016, 1:50 PM PGY-1, [place] Intern pager: [phone], text pages welcome

## 2016-01-06 NOTE — Progress Notes (Signed)
Subjective: 4 Days Post-Op Procedure(s) (LRB): IRRIGATION AND DEBRIDEMENT RIGHT TOTAL KNEE (Right) Patient reports pain as 1 on 0-10 scale.  His knee is very much improved today. His WBC is decreasing and he is afebrile.His Last surgical Culture is NEGATIVE. His MRI shows a far lateral HNP at L-4-L-5 0n the left. Today,he fails to complain of Back pain. He said he was doing fine.   Objective: Vital signs in last 24 hours: Temp:  [97.9 F (36.6 C)-99.2 F (37.3 C)] 99.2 F (37.3 C) (11/12 2024) Pulse Rate:  [63-67] 63 (11/12 2024) Resp:  [17] 17 (11/12 2024) BP: (146-157)/(67-84) 157/84 (11/12 2024) SpO2:  [96 %] 96 % (11/12 2024)  Intake/Output from previous day: No intake/output data recorded. Intake/Output this shift: No intake/output data recorded.   Recent Labs  01/04/16 0414 01/05/16 0415  HGB 13.0 13.1    Recent Labs  01/04/16 0414 01/05/16 0415  WBC 12.8* 11.6*  RBC 3.95* 3.95*  HCT 38.0* 37.8*  PLT 367 364    Recent Labs  01/04/16 0414 01/05/16 0415  NA 140 138  K 3.7 4.0  CL 107 106  CO2 27 26  BUN 6 7  CREATININE 1.29* 1.45*  GLUCOSE 92 88  CALCIUM 8.1* 8.3*   No results for input(s): LABPT, INR in the last 72 hours.  No cellulitis present Compartment soft  Assessment/Plan: 4 Days Post-Op Procedure(s) (LRB): IRRIGATION AND DEBRIDEMENT RIGHT TOTAL KNEE (Right) Up with therapy Discharge home with home health  Taiquan Campanaro A 01/06/2016, 7:23 AM

## 2016-01-06 NOTE — Progress Notes (Signed)
Patient educated on how to give him self intramuscular injections. Patient states understanding with demonstration reviewed on how to self inject Jandiel Magallanes A Archivist, RN

## 2016-01-06 NOTE — Discharge Summary (Signed)
[facility] Discharge Summary  Patient name: Juan Wiggins Medical record number: [id number] Date of birth: [birth year] Age: 65 y.o. Gender: male Date of Admission: 12/29/2015  Date of Discharge: 01/06/2016 Admitting Physician: Dickie La, MD  Primary Care Provider: Helane Rima, MD Consultants: Orthopedics  Indication for Hospitalization: Septic R knee  Discharge Diagnoses/Problem List:  Active Problems:   Sepsis ([place])   Lactic acidosis   Pain   Hepatitis B infection without delta agent without hepatic coma   Left upper quadrant pain   Pleural effusion   Infection of total knee replacement (HCC)   Lumbar spine pain   Difficulty in walking, not elsewhere classified  Disposition: Home   Discharge Condition: Stable, improved.   Discharge Exam:  General: NAD, laying in hospital bed Cardiovascular: RRR, no m/r/g Respiratory: CTA bil no W/R/R Abdomen: soft and nontender, ND, +bs Extremities: R knee with c/d/i incision sites Neuro: no focal deficits Psych: mood appropriate  Brief Hospital Course:  65 yo M presents to ED with septic R knee joint.  PMH significant for hypothyroidism, alcohol abuse, Hepatitis B, Hepatitis C,  HTN, GERD, chronic pain syndrome, hx of TKA (both in 2015), hx of R septic knee (06/2015), major depression, anxiety and BPH.  Patient in ED meeting sepsis criteria with painful swelling and warmth of R knee. UDS+ for benzos, opiates and cocaine.  Vitals on admission notable for hypotension to 94/54.  Received 3 L NS with some improvement. Started on Vancomycin and Cefepime.  Afebrile with elevated white count to 15.  LA 1.50>4.57>normalized to 0.8.  ESR 6, CRP elevated to 8.2.  Code sepsis was called, Orthopedics consulted, and patient taken to OR for R knee arthroscopy with I&D.   Wound cultures taken.  Patient tolerated procedure well and was monitored post-op. PICC line was placed and patient started on Cefazolin 2 g TID while  hospitalized.  Pain was well controlled with Morphine and Oxycodone.    A repeat I&D was performed by Orthopedics which the patient also tolerated well.  Patient was seen by PT/OT for recommendations.  Over the next several days, he stated feeling better with pain well controlled.  Was transitioned off IV medications to po.  Wound cultures negative growth at 48 hours.  Infectious disease was consulted and best option for outpatient antibiotics would have been 6 weeks of Ancef per PICC line at SNF.  Patient refused SNF adamantly.  However, in light of his history of drug use, patient not a candidate for PICC line at home.  Was offered the option of self-administered IM injections of CTX 2 g daily x total of 6 weeks.  Patient agreed and was educated on how to administer the injections by RN.  Home health was set up for twice weekly visits to his home to ensure proper administration of the antibiotics.  Orthopedics to follow outpatient. Patient stable of time of discharge home and was set up with the above follow up.     Issues for Follow Up:  1. Patient to self-administer Intramuscular 2 g Ceftriaxone injections once daily for total of 6 weeks.  2. Home health twice weekly to ensure proper administration of antibiotics 3. Propranolol was stopped this admission given his cocaine use.   Significant Procedures: Arthroscopy of R knee with I&D x 2  Significant Labs and Imaging:   Recent Labs Lab 01/03/16 0500 01/04/16 0414 01/05/16 0415  WBC 10.7* 12.8* 11.6*  HGB 12.3* 13.0 13.1  HCT 36.4* 38.0* 37.8*  PLT 293 367 364    Recent Labs Lab 01/01/16 0500 01/02/16 0430 01/03/16 0500 01/04/16 0414 01/05/16 0415  NA 135 136 137 140 138  K 3.8 3.7 3.9 3.7 4.0  CL 107 105 108 107 106  CO2 _0 GLUCOSE 91 103* 84 92 88  BUN _1 CREATININE 1.32* 1.24 1.16 1.29* 1.45*  CALCIUM 8.2* 8.2* 8.0* 8.1* 8.3*   Mr Lumbar Spine W Wo Contrast  Result Date: 01/04/2016 CLINICAL DATA:   Back pain.  Prior back surgery at L4-5. EXAM: MRI LUMBAR SPINE WITHOUT AND WITH CONTRAST TECHNIQUE: Multiplanar and multiecho pulse sequences of the lumbar spine were obtained without and with intravenous contrast. CONTRAST:  54m MULTIHANCE GADOBENATE DIMEGLUMINE 529 MG/ML IV SOLN COMPARISON:  04/28/2012 FINDINGS: Segmentation:  Standard. Alignment:  2 mm anterolisthesis of L4 on L5. Vertebrae: No fracture, evidence of discitis, or bone lesion. Mild osteoarthritis of bilateral sacroiliac joints. Conus medullaris: Extends to the T12 level and appears normal. Paraspinal and other soft tissues: Postsurgical changes in the posterior paraspinal soft tissues at L4-5 with a 2.4 x 1.6 cm fluid collection in the left laminectomy surgical bed consistent with a postsurgical seroma. Disc levels: Disc spaces: Degenerative disc disease with disc height loss at L2-3 with Modic endplate changes. T12-L1: Mild broad-based disc bulge. No evidence of neural foraminal stenosis. No central canal stenosis. L1-L2: No significant disc bulge. No evidence of neural foraminal stenosis. No central canal stenosis. L2-L3: Broad-based disc bulge eccentric towards the right. Mild right foraminal narrowing. No left foraminal narrowing. No central canal stenosis. L3-L4: No significant disc bulge. Mild bilateral facet arthropathy. No evidence of neural foraminal stenosis. No central canal stenosis. L4-L5: Left foraminal/lateral disc protrusion with severe left facet arthropathy resulting in moderate -severe left foraminal stenosis. No right foraminal stenosis. No central canal stenosis. L5-S1: Broad-based disc bulge. Moderate bilateral facet arthropathy. Mild right foraminal stenosis. No left foraminal stenosis. No central canal stenosis. IMPRESSION: 1. At L4-5 there is evidence of prior left laminotomy defect. Postsurgical changes in the posterior paraspinal soft tissues at L4-5 with a 2.4 x 1.6 cm fluid collection in the left laminectomy surgical  bed consistent with a postsurgical seroma. Left foraminal/lateral disc protrusion with severe left facet arthropathy resulting in moderate -severe left foraminal stenosis. Electronically Signed   By: HKathreen Devoid  On: 01/04/2016 17:07   Results/Tests Pending at Time of Discharge: None  Discharge Medications:    Medication List    STOP taking these medications   dicyclomine 20 MG tablet Commonly known as:  BENTYL   ferrous sulfate 325 (65 FE) MG tablet   INDERAL LA 60 MG 24 hr capsule Generic drug:  propranolol ER   loperamide 2 MG capsule Commonly known as:  IMODIUM   mirtazapine 15 MG tablet Commonly known as:  REMERON   sucralfate 1 g tablet Commonly known as:  CARAFATE     TAKE these medications   acetaminophen 500 MG tablet Commonly known as:  TYLENOL Take 500-1,500 mg by mouth every 6 (six) hours as needed for headache (pain).   albuterol 108 (90 Base) MCG/ACT inhaler Commonly known as:  PROVENTIL HFA;VENTOLIN HFA Inhale 2-4 puffs into the lungs daily as needed for shortness of breath.   aspirin 325 MG tablet Take 1 tablet (325 mg total) by mouth 2 (two) times daily. What changed:  when to take this   bisacodyl 5 MG EC tablet Commonly known as:  DULCOLAX  Take 1 tablet (5 mg total) by mouth daily as needed for moderate constipation.   cefTRIAXone 1 g injection Commonly known as:  ROCEPHIN Inject 2 g into the muscle daily.   Cholecalciferol 2000 units Caps Take 6,000 Units by mouth daily.   escitalopram 20 MG tablet Commonly known as:  LEXAPRO Take 20 mg by mouth at bedtime.   hydrOXYzine 25 MG tablet Commonly known as:  ATARAX/VISTARIL Take 1 tablet (25 mg total) by mouth 3 (three) times daily as needed. What changed:  when to take this   levothyroxine 200 MCG tablet Commonly known as:  SYNTHROID, LEVOTHROID Take 200 mcg by mouth daily before breakfast.   methocarbamol 500 MG tablet Commonly known as:  ROBAXIN Take 1 tablet (500 mg total) by  mouth every 6 (six) hours as needed for muscle spasms.   ondansetron 4 MG tablet Commonly known as:  ZOFRAN Take 1 tablet (4 mg total) by mouth every 8 (eight) hours as needed for nausea or vomiting.   oxyCODONE 5 MG immediate release tablet Commonly known as:  Oxy IR/ROXICODONE Take 1-2 tablets (5-10 mg total) by mouth every 4 (four) hours as needed for moderate pain. What changed:  medication strength  how much to take  reasons to take this  Another medication with the same name was removed. Continue taking this medication, and follow the directions you see here.   promethazine 12.5 MG tablet Commonly known as:  PHENERGAN Take 25 mg by mouth every 6 (six) hours as needed for nausea or vomiting.   silodosin 8 MG Caps capsule Commonly known as:  RAPAFLO Take 8 mg by mouth daily with breakfast.   Sofosbuvir-Velpatasvir 400-100 MG Tabs Commonly known as:  EPCLUSA Take 1 tablet by mouth daily.   tenofovir 300 MG tablet Commonly known as:  VIREAD Take 1 tablet (300 mg total) by mouth daily.   valACYclovir 1000 MG tablet Commonly known as:  VALTREX Take 1,000 mg by mouth See admin instructions. Take 1 tablet by mouth daily for flares - until one day past outbreak   zolpidem 10 MG tablet Commonly known as:  AMBIEN Take 1 tablet (10 mg total) by mouth at bedtime as needed for sleep. What changed:  when to take this      Discharge Instructions: Please refer to Patient Instructions section of EMR for full details.  Patient was counseled important signs and symptoms that should prompt return to medical care, changes in medications, dietary instructions, activity restrictions, and follow up appointments.   Follow-Up Appointments: Follow-up Information    [facility] Follow up.   Specialty:  [place] Why:  Someone from [facility] will contact you to arrange start date and time for therapy. Contact information: [place] [address] [phone]        Tobi Bastos, MD. Schedule an appointment as soon as possible for a visit on 01/09/2016.   Specialty:  Orthopedic Surgery Contact information: [address] [place] [address] [phone]          [name], MD 01/06/2016, 9:01 PM PGY-1, [place]

## 2016-01-06 NOTE — Progress Notes (Signed)
Discharge papers reviewed with patient. Gave patient his home medications back from pharmacy. IV removed. Family available at bedside for transportation [place], RN

## 2016-01-07 ENCOUNTER — Telehealth (HOSPITAL_BASED_OUTPATIENT_CLINIC_OR_DEPARTMENT_OTHER): Payer: Self-pay | Admitting: Emergency Medicine

## 2016-01-07 LAB — AEROBIC/ANAEROBIC CULTURE W GRAM STAIN (SURGICAL/DEEP WOUND)

## 2016-01-07 LAB — AEROBIC/ANAEROBIC CULTURE (SURGICAL/DEEP WOUND): CULTURE: NO GROWTH

## 2016-01-08 ENCOUNTER — Other Ambulatory Visit: Payer: Self-pay | Admitting: *Deleted

## 2016-01-08 NOTE — Telephone Encounter (Signed)
Patient was discharged yesterday from the hospital with a script to receive "prefilled" syringes with the ceftriaxone he was supposed to give himself at home for the next 6 weeks.  He filled this at [facility] and they don't carry prefilled syringes and he was given a vial that needs to be reconstituted.  Will forward to MD who saw and discharged him from the hospital to advise. I did ask patient to call his PCP's office to see if this is something they can administer to him, but he states that it would be a daily trip for 6 weeks and that is inconvenient.  Jazmin Hartsell,CMA

## 2016-01-09 NOTE — Telephone Encounter (Signed)
Juan Wiggins,   Called [facility] outpatient pharmacy yesterday to see what we need to do and they said maybe sending him the saline he needs to reconstitute the Ceftriaxone with.  Hyman Bible also knows about this and we will call [facility] outpatient pharmacy again to try to work on it tomorrow.    Thanks

## 2016-01-10 ENCOUNTER — Telehealth: Payer: Self-pay | Admitting: *Deleted

## 2016-01-10 NOTE — Telephone Encounter (Signed)
Patient called and advised he would like to speak with Dr Baxter Flattery about a new medication she is on. He would not tell me what medicine he is taking just asked to have Dr Baxter Flattery call. Advised him not sure she will be able to call him but that I will let her know.

## 2016-01-12 ENCOUNTER — Telehealth: Payer: Self-pay | Admitting: *Deleted

## 2016-01-12 NOTE — Telephone Encounter (Signed)
65 y.o M who is s/p I and D for Septic Joint 12/2015. He does have a drug use Hx so medical team leary of PICC placement and IV abx therapy. Pt adamantly against SNF placement. Discharge plan included daily IM Rocephin x 6 weeks after his 11/13 discharge with follow up by [facility] 2 x weekly.  Spoke with patient who believes he cannot receive abx because it is a special mixture when in fact [employer] will not pay for Home administration of IV Rocephin. He will need 36 vials at $87.99 each [id number]). CM called [facility] to have RX transferred the their pharmacy for delivery. Pt will need Daily IM injections. He was set up with [facility] health but has not had initial visit as he has not had meds for administration. Pt may need Daily  Short Stay visits to ensure [facility] assist with payment. Will follow through until completion for this unfortunate pt.

## 2016-01-12 NOTE — Telephone Encounter (Signed)
12:35 Spoke with Rep for [facility], Jermaine who is following up with [facility] pharmacy to ensure medication is avaialble and can be delivered to home. If this is the case we will need to change [facility] agency from [facility] to [facility] to make sure IM injections are prepared for the pt and syringes are not left at the pts home, etc. Will continue to follow.

## 2016-01-12 NOTE — Telephone Encounter (Signed)
After much ado spoke with Velva Harman with [facility] in [place] who assures me he has been working with [facility] to arrange IM injections in an Outpt setting beginning on Monday 01/13/2016. He tells me it is ok that I have transferred the RX from retail pharmacy to [facility]. I shared all that pt had shared with me and his confusion about his care.  Called Mr Veeder back to tell him what I had learned and that he should await call from [facility] that is making all arrangements for his Outpt care. He tells me he was told to go to [facility] and ask for his IM injections. We talked about the validity or lack of validity of this plan. We discussed the seriousness of a septic joint and the importance of Antibiotics. Pt appreciative of all assistance. No further CM needs at this time.

## 2016-01-13 ENCOUNTER — Telehealth: Payer: Self-pay | Admitting: Pharmacist Clinician (PhC)/ Clinical Pharmacy Specialist

## 2016-01-13 ENCOUNTER — Telehealth: Payer: Self-pay | Admitting: *Deleted

## 2016-01-13 ENCOUNTER — Telehealth: Payer: Self-pay | Admitting: Obstetrics and Gynecology

## 2016-01-13 ENCOUNTER — Other Ambulatory Visit: Payer: Self-pay | Admitting: Internal Medicine

## 2016-01-13 NOTE — Telephone Encounter (Signed)
He was given an option to either go to SNF to get IV abtx vs. Come to infusion on a daily basis to get daily ceftriaxone. He is actively using so no picc line for home use for him. Too risky. Will call him

## 2016-01-13 NOTE — Telephone Encounter (Signed)
Juan Wiggins called and said that his pharmacy refused to refill his Epclusa. I called the pharmacy and confirmed that he did receive 3 mo of Epclusa. I told them to cont his Viread for his hep B. I relayed the message to his sister also that he doesn't need anymore Epclusa. Apparently, he was supposed to get his IM Rocephin after discharge for his MSSA septic joint but he has not gotten any abx since discharge. He is going to be readmitted today for the knee issue.

## 2016-01-13 NOTE — Telephone Encounter (Signed)
Over weekend found out patient has not been on antibiotics since discharge. Was discharged on 01/06/16 with the plan of going home to give himself IM CTX injections for 6 weeks. This had been coordinated with the case manager.   Our team received a call from patient's insurance representative on Friday stating that they will not cover IM self-administered injections for the patient. Therefore patient has not received any antibiotics. They wanted to see if we could coordinate patient getting set up with a short stay to receive injections. This was an option at discharge but patient said that transportation was going to be an issue so declined this option along with the SNF placement.   At this time I encouraged patient to come to hospital for re-admission to receive antibiotics. He states his knee is swollen and red. Denies any fevers. Has been ambulating on knee. Will continue to follow with hopes patient will present to ED today.  Luiz Blare, DO PGY-3, [facility]

## 2016-01-13 NOTE — Telephone Encounter (Signed)
Patient called stating that after speaking to [place], [place] , pharmacist he is still having an issue regarding not having any order for his antibiotic. He wanted to know if Dr. Baxter Flattery knows a Dr. Gerarda Fraction (he thought this was an ID doc). Advised him I would route his message to Dr. Baxter Flattery and [facility] and request that the pharmacist return his call in the morning.

## 2016-01-14 ENCOUNTER — Telehealth: Payer: Self-pay | Admitting: Pharmacist Clinician (PhC)/ Clinical Pharmacy Specialist

## 2016-01-14 ENCOUNTER — Encounter (HOSPITAL_COMMUNITY): Payer: Self-pay

## 2016-01-14 ENCOUNTER — Inpatient Hospital Stay (HOSPITAL_COMMUNITY)
Admission: EM | Admit: 2016-01-14 | Discharge: 2016-01-24 | DRG: 464 | Disposition: A | Discharge status: Skilled Nursing Facility | Payer: 59 | Attending: Orthopedic Surgery | Admitting: Orthopedic Surgery

## 2016-01-14 ENCOUNTER — Emergency Department (HOSPITAL_COMMUNITY): Payer: 59

## 2016-01-14 DIAGNOSIS — M069 Rheumatoid arthritis, unspecified: Secondary | ICD-10-CM | POA: Diagnosis present

## 2016-01-14 DIAGNOSIS — B999 Unspecified infectious disease: Secondary | ICD-10-CM | POA: Diagnosis not present

## 2016-01-14 DIAGNOSIS — R32 Unspecified urinary incontinence: Secondary | ICD-10-CM | POA: Diagnosis not present

## 2016-01-14 DIAGNOSIS — B182 Chronic viral hepatitis C: Secondary | ICD-10-CM | POA: Diagnosis present

## 2016-01-14 DIAGNOSIS — G4730 Sleep apnea, unspecified: Secondary | ICD-10-CM | POA: Diagnosis present

## 2016-01-14 DIAGNOSIS — K219 Gastro-esophageal reflux disease without esophagitis: Secondary | ICD-10-CM | POA: Diagnosis present

## 2016-01-14 DIAGNOSIS — M009 Pyogenic arthritis, unspecified: Secondary | ICD-10-CM | POA: Diagnosis not present

## 2016-01-14 DIAGNOSIS — T8453XA Infection and inflammatory reaction due to internal right knee prosthesis, initial encounter: Principal | ICD-10-CM | POA: Diagnosis present

## 2016-01-14 DIAGNOSIS — E039 Hypothyroidism, unspecified: Secondary | ICD-10-CM | POA: Diagnosis present

## 2016-01-14 DIAGNOSIS — I10 Essential (primary) hypertension: Secondary | ICD-10-CM | POA: Diagnosis present

## 2016-01-14 DIAGNOSIS — B181 Chronic viral hepatitis B without delta-agent: Secondary | ICD-10-CM | POA: Diagnosis present

## 2016-01-14 DIAGNOSIS — R569 Unspecified convulsions: Secondary | ICD-10-CM | POA: Diagnosis not present

## 2016-01-14 DIAGNOSIS — R52 Pain, unspecified: Secondary | ICD-10-CM | POA: Diagnosis not present

## 2016-01-14 DIAGNOSIS — Z96659 Presence of unspecified artificial knee joint: Secondary | ICD-10-CM

## 2016-01-14 DIAGNOSIS — G894 Chronic pain syndrome: Secondary | ICD-10-CM | POA: Diagnosis present

## 2016-01-14 DIAGNOSIS — F40240 Claustrophobia: Secondary | ICD-10-CM | POA: Diagnosis present

## 2016-01-14 DIAGNOSIS — F331 Major depressive disorder, recurrent, moderate: Secondary | ICD-10-CM | POA: Diagnosis present

## 2016-01-14 DIAGNOSIS — F1990 Other psychoactive substance use, unspecified, uncomplicated: Secondary | ICD-10-CM | POA: Diagnosis not present

## 2016-01-14 DIAGNOSIS — Z85820 Personal history of malignant melanoma of skin: Secondary | ICD-10-CM

## 2016-01-14 DIAGNOSIS — T8459XA Infection and inflammatory reaction due to other internal joint prosthesis, initial encounter: Secondary | ICD-10-CM

## 2016-01-14 DIAGNOSIS — Y831 Surgical operation with implant of artificial internal device as the cause of abnormal reaction of the patient, or of later complication, without mention of misadventure at the time of the procedure: Secondary | ICD-10-CM | POA: Diagnosis present

## 2016-01-14 DIAGNOSIS — G4089 Other seizures: Secondary | ICD-10-CM | POA: Diagnosis not present

## 2016-01-14 DIAGNOSIS — T8459XS Infection and inflammatory reaction due to other internal joint prosthesis, sequela: Secondary | ICD-10-CM | POA: Diagnosis not present

## 2016-01-14 DIAGNOSIS — N400 Enlarged prostate without lower urinary tract symptoms: Secondary | ICD-10-CM | POA: Diagnosis present

## 2016-01-14 DIAGNOSIS — T8459XD Infection and inflammatory reaction due to other internal joint prosthesis, subsequent encounter: Secondary | ICD-10-CM | POA: Diagnosis not present

## 2016-01-14 DIAGNOSIS — T63301A Toxic effect of unspecified spider venom, accidental (unintentional), initial encounter: Secondary | ICD-10-CM | POA: Diagnosis present

## 2016-01-14 DIAGNOSIS — M25561 Pain in right knee: Secondary | ICD-10-CM | POA: Diagnosis present

## 2016-01-14 DIAGNOSIS — M00061 Staphylococcal arthritis, right knee: Secondary | ICD-10-CM | POA: Diagnosis present

## 2016-01-14 DIAGNOSIS — N179 Acute kidney failure, unspecified: Secondary | ICD-10-CM | POA: Diagnosis present

## 2016-01-14 DIAGNOSIS — B9561 Methicillin susceptible Staphylococcus aureus infection as the cause of diseases classified elsewhere: Secondary | ICD-10-CM | POA: Diagnosis present

## 2016-01-14 DIAGNOSIS — Z96653 Presence of artificial knee joint, bilateral: Secondary | ICD-10-CM | POA: Diagnosis present

## 2016-01-14 DIAGNOSIS — Z8249 Family history of ischemic heart disease and other diseases of the circulatory system: Secondary | ICD-10-CM | POA: Diagnosis not present

## 2016-01-14 DIAGNOSIS — E875 Hyperkalemia: Secondary | ICD-10-CM | POA: Diagnosis not present

## 2016-01-14 DIAGNOSIS — Y792 Prosthetic and other implants, materials and accessory orthopedic devices associated with adverse incidents: Secondary | ICD-10-CM | POA: Diagnosis not present

## 2016-01-14 DIAGNOSIS — Z87891 Personal history of nicotine dependence: Secondary | ICD-10-CM

## 2016-01-14 DIAGNOSIS — F411 Generalized anxiety disorder: Secondary | ICD-10-CM | POA: Diagnosis present

## 2016-01-14 DIAGNOSIS — Z79899 Other long term (current) drug therapy: Secondary | ICD-10-CM

## 2016-01-14 DIAGNOSIS — T8453XD Infection and inflammatory reaction due to internal right knee prosthesis, subsequent encounter: Secondary | ICD-10-CM | POA: Diagnosis not present

## 2016-01-14 HISTORY — DX: Pyogenic arthritis, unspecified: M00.9

## 2016-01-14 LAB — CBC WITH DIFFERENTIAL/PLATELET
BASOS ABS: 0.1 10*3/uL (ref 0.0–0.1)
BASOS PCT: 1 %
EOS PCT: 2 %
Eosinophils Absolute: 0.3 10*3/uL (ref 0.0–0.7)
HCT: 37.9 % — ABNORMAL LOW (ref 39.0–52.0)
Hemoglobin: 12.7 g/dL — ABNORMAL LOW (ref 13.0–17.0)
Lymphocytes Relative: 15 %
Lymphs Abs: 1.8 10*3/uL (ref 0.7–4.0)
MCH: 32.5 pg (ref 26.0–34.0)
MCHC: 33.5 g/dL (ref 30.0–36.0)
MCV: 96.9 fL (ref 78.0–100.0)
MONO ABS: 1.2 10*3/uL — AB (ref 0.1–1.0)
Monocytes Relative: 9 %
Neutro Abs: 9.4 10*3/uL — ABNORMAL HIGH (ref 1.7–7.7)
Neutrophils Relative %: 73 %
PLATELETS: 334 10*3/uL (ref 150–400)
RBC: 3.91 MIL/uL — ABNORMAL LOW (ref 4.22–5.81)
RDW: 14.7 % (ref 11.5–15.5)
WBC: 12.7 10*3/uL — ABNORMAL HIGH (ref 4.0–10.5)

## 2016-01-14 LAB — URINALYSIS, ROUTINE W REFLEX MICROSCOPIC
BILIRUBIN URINE: NEGATIVE
GLUCOSE, UA: NEGATIVE mg/dL
KETONES UR: NEGATIVE mg/dL
LEUKOCYTES UA: NEGATIVE
Nitrite: NEGATIVE
PROTEIN: 100 mg/dL — AB
Specific Gravity, Urine: 1.012 (ref 1.005–1.030)
pH: 7 (ref 5.0–8.0)

## 2016-01-14 LAB — RAPID URINE DRUG SCREEN, HOSP PERFORMED
AMPHETAMINES: NOT DETECTED
BARBITURATES: NOT DETECTED
Benzodiazepines: POSITIVE — AB
Cocaine: NOT DETECTED
OPIATES: POSITIVE — AB
TETRAHYDROCANNABINOL: NOT DETECTED

## 2016-01-14 LAB — URINE MICROSCOPIC-ADD ON

## 2016-01-14 LAB — BASIC METABOLIC PANEL
ANION GAP: 6 (ref 5–15)
BUN: 16 mg/dL (ref 6–20)
CALCIUM: 8.4 mg/dL — AB (ref 8.9–10.3)
CO2: 25 mmol/L (ref 22–32)
Chloride: 103 mmol/L (ref 101–111)
Creatinine, Ser: 2.36 mg/dL — ABNORMAL HIGH (ref 0.61–1.24)
GFR, EST AFRICAN AMERICAN: 32 mL/min — AB (ref >60)
GFR, EST NON AFRICAN AMERICAN: 28 mL/min — AB (ref >60)
GLUCOSE: 123 mg/dL — AB (ref 65–99)
Potassium: 4.7 mmol/L (ref 3.5–5.1)
Sodium: 134 mmol/L — ABNORMAL LOW (ref 135–145)

## 2016-01-14 LAB — C-REACTIVE PROTEIN: CRP: 9.4 mg/dL — AB (ref <1.0)

## 2016-01-14 LAB — SEDIMENTATION RATE: SED RATE: 55 mm/h — AB (ref 0–16)

## 2016-01-14 MED ORDER — ONDANSETRON HCL 4 MG/2ML IJ SOLN
4.0000 mg | Freq: Once | INTRAMUSCULAR | Status: AC
  Administered 2016-01-14: 4 mg via INTRAVENOUS
  Filled 2016-01-14: qty 2

## 2016-01-14 MED ORDER — ASPIRIN 325 MG PO TABS
325.0000 mg | ORAL_TABLET | Freq: Every day | ORAL | Status: DC
  Filled 2016-01-14 (×2): qty 1

## 2016-01-14 MED ORDER — SOFOSBUVIR-VELPATASVIR 400-100 MG PO TABS
1.0000 | ORAL_TABLET | Freq: Every day | ORAL | Status: DC
  Administered 2016-01-15 – 2016-01-19 (×5): 1 via ORAL
  Filled 2016-01-14 (×2): qty 1

## 2016-01-14 MED ORDER — ZOLPIDEM TARTRATE 5 MG PO TABS
5.0000 mg | ORAL_TABLET | Freq: Every evening | ORAL | Status: DC | PRN
  Administered 2016-01-14 – 2016-01-23 (×6): 5 mg via ORAL
  Filled 2016-01-14 (×7): qty 1

## 2016-01-14 MED ORDER — MORPHINE SULFATE (PF) 4 MG/ML IV SOLN
4.0000 mg | Freq: Once | INTRAVENOUS | Status: AC
  Administered 2016-01-14: 4 mg via INTRAVENOUS
  Filled 2016-01-14: qty 1

## 2016-01-14 MED ORDER — BISACODYL 5 MG PO TBEC: 5.0000 mg | DELAYED_RELEASE_TABLET | Freq: Every day | ORAL | Status: DC | PRN

## 2016-01-14 MED ORDER — ONDANSETRON HCL 4 MG PO TABS
4.0000 mg | ORAL_TABLET | Freq: Four times a day (QID) | ORAL | Status: DC | PRN
  Administered 2016-01-15: 4 mg via ORAL
  Filled 2016-01-14: qty 1

## 2016-01-14 MED ORDER — ESCITALOPRAM OXALATE 20 MG PO TABS
20.0000 mg | ORAL_TABLET | Freq: Every day | ORAL | Status: DC
  Administered 2016-01-15 – 2016-01-24 (×10): 20 mg via ORAL
  Filled 2016-01-14 (×11): qty 1

## 2016-01-14 MED ORDER — CEFAZOLIN SODIUM-DEXTROSE 2-4 GM/100ML-% IV SOLN
2.0000 g | Freq: Two times a day (BID) | INTRAVENOUS | Status: DC
Start: 2016-01-14 — End: 2016-01-15
  Administered 2016-01-14 – 2016-01-15 (×2): 2 g via INTRAVENOUS
  Filled 2016-01-14 (×4): qty 100

## 2016-01-14 MED ORDER — TAMSULOSIN HCL 0.4 MG PO CAPS
0.4000 mg | ORAL_CAPSULE | Freq: Every day | ORAL | Status: DC
  Administered 2016-01-15 – 2016-01-24 (×10): 0.4 mg via ORAL
  Filled 2016-01-14 (×10): qty 1

## 2016-01-14 MED ORDER — ACETAMINOPHEN 500 MG PO TABS
1000.0000 mg | ORAL_TABLET | Freq: Once | ORAL | Status: AC
  Administered 2016-01-14: 1000 mg via ORAL
  Filled 2016-01-14: qty 2

## 2016-01-14 MED ORDER — MORPHINE SULFATE (PF) 2 MG/ML IV SOLN
2.0000 mg | INTRAVENOUS | Status: DC | PRN
  Administered 2016-01-14 – 2016-01-15 (×6): 2 mg via INTRAVENOUS
  Filled 2016-01-14 (×6): qty 1

## 2016-01-14 MED ORDER — TENOFOVIR DISOPROXIL FUMARATE 300 MG PO TABS
300.0000 mg | ORAL_TABLET | Freq: Every day | ORAL | Status: DC
  Administered 2016-01-15: 300 mg via ORAL
  Filled 2016-01-14: qty 1

## 2016-01-14 MED ORDER — SODIUM CHLORIDE 0.9 % IV SOLN
INTRAVENOUS | Status: DC
  Administered 2016-01-14: 1000 mL via INTRAVENOUS
  Administered 2016-01-15 (×2): via INTRAVENOUS

## 2016-01-14 MED ORDER — ALBUTEROL SULFATE (2.5 MG/3ML) 0.083% IN NEBU: 3.0000 mL | INHALATION_SOLUTION | Freq: Every day | RESPIRATORY_TRACT | Status: DC | PRN

## 2016-01-14 MED ORDER — ONDANSETRON HCL 4 MG/2ML IJ SOLN: 4.0000 mg | Freq: Four times a day (QID) | INTRAMUSCULAR | Status: DC | PRN

## 2016-01-14 MED ORDER — CEFAZOLIN SODIUM-DEXTROSE 2-4 GM/100ML-% IV SOLN
2.0000 g | Freq: Three times a day (TID) | INTRAVENOUS | Status: DC
  Administered 2016-01-14: 2 g via INTRAVENOUS
  Filled 2016-01-14 (×3): qty 100

## 2016-01-14 MED ORDER — LEVOTHYROXINE SODIUM 100 MCG PO TABS
200.0000 ug | ORAL_TABLET | Freq: Every day | ORAL | Status: DC
  Administered 2016-01-15 – 2016-01-24 (×10): 200 ug via ORAL
  Filled 2016-01-14 (×11): qty 2

## 2016-01-14 MED ORDER — HYDROXYZINE HCL 25 MG PO TABS: 25.0000 mg | ORAL_TABLET | Freq: Three times a day (TID) | ORAL | Status: DC | PRN

## 2016-01-14 MED ORDER — METHOCARBAMOL 500 MG PO TABS
500.0000 mg | ORAL_TABLET | Freq: Four times a day (QID) | ORAL | Status: DC | PRN
  Administered 2016-01-16: 500 mg via ORAL
  Filled 2016-01-14 (×2): qty 1

## 2016-01-14 MED FILL — ONDANSETRON HCL 4 MG TABLET: 4 | 7 days supply | Qty: 20 | Fill #0

## 2016-01-14 NOTE — ED Provider Notes (Signed)
[facility] DEPT Provider Note   CSN: [id number] Arrival date & time: 01/14/16  1102     History   Chief Complaint Chief Complaint  Patient presents with  . Wound Infection    HPI Juan Wiggins is a 65 y.o. male.  The history is provided by the patient.  Knee Pain   This is a recurrent problem. The current episode started more than 2 days ago. The problem occurs constantly. The problem has been rapidly worsening. The pain is present in the right knee. The quality of the pain is described as aching and sharp. The pain is at a severity of 9/10. The pain is moderate. He has tried nothing for the symptoms.    Past Medical History:  Diagnosis Date  . Anxiety   . Arthritis    RHEUMATOID ARTHRITIS; OA LEFT KNEE  . Cancer (HCC)    MELANOMA REMOVED RT SHOULDER  . Depression   . GERD (gastroesophageal reflux disease)    PREVACID IF NEEDED  . Heart murmur   . Hemorrhoids   . Hepatitis A    PT STATES TYPE OF HEPATITIS YOU GET FROM SHELLFISH  . Hepatitis B   . Hepatitis C   . Hypertension   . Hypothyroidism   . 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 Pancreatitis, alcoholic, acute   . Sleep apnea    CLAUSTROPHOBIC - COULD NOT TOLERATE CPAP MASK  . Tachycardia    PT STATES HIS HEART RATE USUALLY 100 OR MORE  . Thyroid disease     Patient Active Problem List   Diagnosis Date Noted  . Septic arthritis ([place]) 01/14/2016  . Lumbar spine pain   . Difficulty in walking, not elsewhere classified   . Infection of total knee replacement ([place]) 01/02/2016  . Pleural effusion   . Left upper quadrant pain   . Pain   . Hepatitis B infection without delta agent without hepatic coma   . Sepsis ([place]) 12/29/2015  . Lactic acidosis   . Pyogenic arthritis of right knee joint ([place]) 07/18/2015  . Bacteremia due to Streptococcus 07/18/2015  . Cellulitis of right upper extremity 07/18/2015  . Hepatitis C virus infection without hepatic coma  07/18/2015  . Anemia 07/18/2015  . Opacity of lung on imaging study, bilateral 07/18/2015  . Anxiety state 07/18/2015  . History of total knee arthroplasty 07/16/2015  . Severe sepsis ([place]) 07/14/2015  . Moderate episode of recurrent major depressive disorder ([place])   . Chronic pain syndrome 05/21/2014  . Essential hypertension, benign 08/28/2013  . GERD (gastroesophageal reflux disease) 08/28/2013  . Osteoarthritis of left knee 08/23/2013  . Total knee replacement status 08/23/2013  . Alcohol dependence ([place]) 07/14/2013  . Alcohol dependency ([place]) 07/07/2013  . Major depression 07/07/2013  . Varicose veins of lower extremities with other complications [id number]  . Recurrent falls 04/14/2011  . Hypotension 04/13/2011  . ARF (acute renal failure) ([place]) 04/13/2011  . Hypothyroidism 04/13/2011  . Prolapsed and thrombosed 09/03/2010    Past Surgical History:  Procedure Laterality Date  . colonscopy     . ELBOW SURGERY     BILATERAL ELBOW SURGERY  . I&D EXTREMITY Right 01/02/2016   Procedure: IRRIGATION AND DEBRIDEMENT RIGHT TOTAL KNEE;  Surgeon: Latanya Maudlin, MD;  Location: [facility];  Service: Orthopedics;  Laterality: Right;  . I&D KNEE WITH POLY EXCHANGE Right 07/16/2015   Procedure: IRRIGATION AND DEBRIDEMENT RIGHT KNEE WITH POLY EXCHANGE AND PLACEMENT  OF ANTIBIOTIC BEADS;  Surgeon: Latanya Maudlin, MD;  Location: [facility];  Service: Orthopedics;  Laterality: Right;  . KNEE ARTHROSCOPY Right 07/14/2015   Procedure: ARTHROSCOPY IRRIGATION AND DEBRIDEMENT - KNEE;  Surgeon: Justice Britain, MD;  Location: [facility];  Service: Orthopedics;  Laterality: Right;  . KNEE ARTHROSCOPY Right 12/29/2015   Procedure: ARTHROSCOPIC IRRIGATION AND DEBRIEDMENT RIGHT  KNEE;  Surgeon: Melina Schools, MD;  Location: [facility];  Service: Orthopedics;  Laterality: Right;  . KNEE SURGERY     BILATERAL KNEE ARTHROSCOPY  . left TKR     July 2015  . LUMBAR LAMINECTOMY/DECOMPRESSION MICRODISCECTOMY Left 06/06/2014    Procedure: LEFT L4-5 DECOMPRESSION ;  Surgeon: Melina Schools, MD;  Location: [facility];  Service: Orthopedics;  Laterality: Left;  . REFRACTIVE SURGERY    . SHOULDER SURGERY     RIGHT ROTATOR CUFF REPAIR AND LEFT ARTHROSCOPY  . TOTAL KNEE ARTHROPLASTY Left 08/23/2013   Procedure: LEFT TOTAL KNEE ARTHROPLASTY;  Surgeon: Tobi Bastos, MD;  [facility];  Service: Orthopedics;  Laterality: Left;  . TOTAL KNEE ARTHROPLASTY Right 02/07/2014   Procedure: RIGHT TOTAL KNEE ARTHROPLASTY;  Surgeon: Tobi Bastos, MD;  Location[facility];  Service: Orthopedics;  Laterality: Right;       Home Medications    Prior to Admission medications   Medication Sig Start Date End Date Taking? Authorizing Provider  aspirin 325 MG tablet Take 1 tablet (325 mg total) by mouth 2 (two) times daily. Patient taking differently: Take 325 mg by mouth daily.  07/21/15  Yes Nita Sells, MD  bisacodyl (DULCOLAX) 5 MG EC tablet Take 1 tablet (5 mg total) by mouth daily as needed for moderate constipation. 01/06/16  Yes [name], MD  Cholecalciferol 2000 units CAPS Take 6,000 Units by mouth daily.    Yes Historical Provider, MD  escitalopram (LEXAPRO) 20 MG tablet Take 20 mg by mouth daily.    Yes Historical Provider, MD  hydrOXYzine (ATARAX/VISTARIL) 25 MG tablet Take 1 tablet (25 mg total) by mouth 3 (three) times daily as needed. Patient taking differently: Take 25 mg by mouth 3 (three) times daily.  12/18/15  Yes Norman Clay, MD  levothyroxine (SYNTHROID, LEVOTHROID) 200 MCG tablet Take 200 mcg by mouth daily before breakfast.   Yes Historical Provider, MD  methocarbamol (ROBAXIN) 500 MG tablet Take 1 tablet (500 mg total) by mouth every 6 (six) hours as needed for muscle spasms. 01/05/16  Yes Latanya Maudlin, MD  ondansetron (ZOFRAN) 4 MG tablet TAKE 1 TABLET BY MOUTH EVERY 8 HOURS AS NEEDED FOR NAUSEA OR VOMITING. 01/14/16  Yes Campbell Riches, MD  oxyCODONE (OXY IR/ROXICODONE) 5 MG immediate release  tablet Take 1-2 tablets (5-10 mg total) by mouth every 4 (four) hours as needed for moderate pain. Patient taking differently: Take 10 mg by mouth every 4 (four) hours as needed for moderate pain.  01/05/16  Yes Latanya Maudlin, MD  promethazine (PHENERGAN) 12.5 MG tablet Take 25 mg by mouth every 6 (six) hours as needed for nausea or vomiting.    Yes Historical Provider, MD  propranolol ER (INDERAL LA) 60 MG 24 hr capsule Take 60 mg by mouth every morning. 01/13/16 01/12/17 Yes Historical Provider, MD  silodosin (RAPAFLO) 8 MG CAPS capsule Take 8 mg by mouth daily with breakfast.    Yes Historical Provider, MD  Sofosbuvir-Velpatasvir (EPCLUSA) 400-100 MG TABS Take 1 tablet by mouth daily. 12/23/15  Yes Carlyle Basques, MD  tenofovir (VIREAD) 300 MG tablet Take 1  tablet (300 mg total) by mouth daily. 10/30/15  Yes Carlyle Basques, MD  zolpidem (AMBIEN) 10 MG tablet Take 1 tablet (10 mg total) by mouth at bedtime as needed for sleep. Patient taking differently: Take 10 mg by mouth at bedtime.  12/18/15 01/17/16 Yes Norman Clay, MD  albuterol (PROVENTIL HFA;VENTOLIN HFA) 108 (90 Base) MCG/ACT inhaler Inhale 2-4 puffs into the lungs daily as needed for shortness of breath.  04/25/15   Historical Provider, MD  cefTRIAXone (ROCEPHIN) 1 g injection Inject 2 g into the muscle daily. 01/06/16 02/10/16  Sela Hua, MD  valACYclovir (VALTREX) 1000 MG tablet Take 1,000 mg by mouth See admin instructions. Take 1 tablet by mouth daily for flares - until one day past outbreak 04/08/15   Historical Provider, MD    Family History Family History  Problem Relation Age of Onset  . Hypertension Mother   . Other Mother     varicose veins  . Hypertension Sister     Social History Social History  Substance Use Topics  . Smoking status: Former Smoker    Packs/day: 1.50    Years: 35.00    Types: Cigars    Quit date: 02/22/1998  . Smokeless tobacco: Never Used     Comment: used cigarettes 20 years ago  . Alcohol  use No     Comment: " last alcohol use was in December 2015."     Allergies   Heparin and Wellbutrin [bupropion]   Review of Systems Review of Systems  Constitutional: Positive for fever (subjective). Negative for chills.  HENT: Negative for congestion and facial swelling.   Eyes: Negative for discharge and visual disturbance.  Respiratory: Negative for shortness of breath.   Cardiovascular: Negative for chest pain and palpitations.  Gastrointestinal: Positive for nausea and vomiting. Negative for abdominal pain and diarrhea.  Musculoskeletal: Negative for arthralgias and myalgias.  Skin: Negative for color change and rash.  Neurological: Negative for tremors, syncope and headaches.  Psychiatric/Behavioral: Negative for confusion and dysphoric mood.     Physical Exam Updated Vital Signs BP 137/74 (BP Location: Right Arm)   Pulse (!) 59   Temp 98 F (36.7 C) (Oral)   Resp 18   Ht 5' 11.5" (1.816 m)   Wt 184 lb (83.5 kg)   SpO2 99%   BMI 25.31 kg/m   Physical Exam  Constitutional: He is oriented to person, place, and time. He appears well-developed and well-nourished.  HENT:  Head: Normocephalic and atraumatic.  Eyes: EOM are normal. Pupils are equal, round, and reactive to light.  Neck: Normal range of motion. Neck supple. No JVD present.  Cardiovascular: Normal rate and regular rhythm.  Exam reveals no gallop and no friction rub.   No murmur heard. Pulmonary/Chest: No respiratory distress. He has no wheezes.  Abdominal: He exhibits no distension. There is no rebound and no guarding.  Musculoskeletal: Normal range of motion. He exhibits edema and tenderness.  Right knee swelling no noted erythema or drainage. Pain with range of motion. Pulse motor and sensation intact distally.  Neurological: He is alert and oriented to person, place, and time.  Skin: No rash noted. No pallor.  Psychiatric: He has a normal mood and affect. His behavior is normal.  Nursing note and  vitals reviewed.    ED Treatments / Results  Labs (all labs ordered are listed, but only abnormal results are displayed) Labs Reviewed  CBC WITH DIFFERENTIAL/PLATELET - Abnormal; Notable for the following:  Result Value   WBC 12.7 (*)    RBC 3.91 (*)    Hemoglobin 12.7 (*)    HCT 37.9 (*)    Neutro Abs 9.4 (*)    Monocytes Absolute 1.2 (*)    All other components within normal limits  BASIC METABOLIC PANEL - Abnormal; Notable for the following:    Sodium 134 (*)    Glucose, Bld 123 (*)    Creatinine, Ser 2.36 (*)    Calcium 8.4 (*)    GFR calc non Af Amer 28 (*)    GFR calc Af Amer 32 (*)    All other components within normal limits  SEDIMENTATION RATE - Abnormal; Notable for the following:    Sed Rate 55 (*)    All other components within normal limits  C-REACTIVE PROTEIN - Abnormal; Notable for the following:    CRP 9.4 (*)    All other components within normal limits  CULTURE, BLOOD (ROUTINE X 2)  CULTURE, BLOOD (ROUTINE X 2)  RAPID URINE DRUG SCREEN, HOSP PERFORMED    EKG  EKG Interpretation None       Radiology Dg Knee 2 Views Right  Result Date: 01/14/2016 CLINICAL DATA:  Staph infection RIGHT knee, swelling, redness, pain EXAM: RIGHT KNEE - 3 VIEW COMPARISON:  07/14/2015 FINDINGS: Mild osseous demineralization. Components of RIGHT knee prosthesis again identified in expected positions. No acute fracture, dislocation, or bone destruction. No periprosthetic lucency identified. Mature well-formed periosteal new bone identified at the distal RIGHT femur, unchanged. Soft tissue swelling diffusely at the distal RIGHT thigh and RIGHT knee extending into the medial proximal RIGHT calf. No definite knee joint effusion. IMPRESSION: RIGHT knee prosthesis without acute complication. Soft tissue swelling RIGHT knee. Electronically Signed   By: Lavonia Dana M.D.   On: 01/14/2016 12:39    Procedures Procedures (including critical care time)  Medications Ordered in  ED Medications  ceFAZolin (ANCEF) IVPB 2g/100 mL premix (2 g Intravenous New Bag/Given 01/14/16 1218)  acetaminophen (TYLENOL) tablet 1,000 mg (1,000 mg Oral Given 01/14/16 1204)  morphine 4 MG/ML injection 4 mg (4 mg Intravenous Given 01/14/16 1217)  ondansetron (ZOFRAN) injection 4 mg (4 mg Intravenous Given 01/14/16 1217)     Initial Impression / Assessment and Plan / ED Course  I have reviewed the triage vital signs and the nursing notes.  Pertinent labs & imaging results that were available during my care of the patient were reviewed by me and considered in my medical decision making (see chart for details).  Clinical Course     65 yo M with a Chief complaint of right knee pain. Patient has had recurrent issues with this coming infected. Was recently drained couple weeks ago. Having some subjective fevers and chills at home. I discussed the case with Dr. Baxter Flattery, ID.  she recommended the patient go to a skilled nursing facility to get IV antibiotics. Patient is a poor candidate for a PICC line and see has a history of narcotic abuse. Discussed case with orthopedics, they will come and evaluate the patient after clinic today. Admit to family medicine.  The patients results and plan were reviewed and discussed.   Any x-rays performed were independently reviewed by myself.   Differential diagnosis were considered with the presenting HPI.  Medications  ceFAZolin (ANCEF) IVPB 2g/100 mL premix (2 g Intravenous New Bag/Given 01/14/16 1218)  acetaminophen (TYLENOL) tablet 1,000 mg (1,000 mg Oral Given 01/14/16 1204)  morphine 4 MG/ML injection 4 mg (4 mg Intravenous Given  01/14/16 1217)  ondansetron (ZOFRAN) injection 4 mg (4 mg Intravenous Given 01/14/16 1217)    Vitals:   01/14/16 1105  BP: 137/74  Pulse: (!) 59  Resp: 18  Temp: 98 F (36.7 C)  TempSrc: Oral  SpO2: 99%  Weight: 184 lb (83.5 kg)  Height: 5' 11.5" (1.816 m)    Final diagnoses:  Staphylococcal arthritis of  right knee ([place])    Admission/ observation were discussed with the admitting physician, patient and/or family and they are comfortable with the plan.    Final Clinical Impressions(s) / ED Diagnoses   Final diagnoses:  Staphylococcal arthritis of right knee [facility])    New Prescriptions New Prescriptions   No medications on file     Deno Etienne, DO 01/14/16 1402

## 2016-01-14 NOTE — Consult Note (Signed)
Reason for Consult:Swelling of Right Knee and past history of an infected Right TotalKnee. He stated his initial infection was a result of a spider bite. Referring Physician: DR.Eniola  Juan Wiggins is an 65 y.o. male.  HPI: He had a Right Total Knee by me two tears ago. Then after a year he had an infected Spider bite on his Forearm tha led to a septicema and renal failure. As a result ,he was admitted to [facility] for three weeks. After DC he developed a septic knee an was washed out and two days later I did a POLY exchange. He did well for a period of time and then had a reoccurence a few weeks ago and as a result had two successive Clean outs. The last one that I did showed a NEGATIVE Culture. He was DC by the Hospitalist on IM Antibiotics that he never did Obtain. He was readmitted this time by Infectious Disease for treatment. Three straight times hias admissions showed Positive for Cocaine.  Past Medical History:  Diagnosis Date  . Anxiety   . Arthritis    RHEUMATOID ARTHRITIS; OA LEFT KNEE  . Cancer (HCC)    MELANOMA REMOVED RT SHOULDER  . Depression   . GERD (gastroesophageal reflux disease)    PREVACID IF NEEDED  . Heart murmur   . Hemorrhoids   . Hepatitis A    PT STATES TYPE OF HEPATITIS YOU GET FROM SHELLFISH  . Hepatitis B   . Hepatitis C   . Hypertension   . Hypothyroidism   . 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 Pancreatitis, alcoholic, acute   . Septic arthritis (HCC) 01/14/2016   RT KNEE  . Sleep apnea    CLAUSTROPHOBIC - COULD NOT TOLERATE CPAP MASK  . Tachycardia    PT STATES HIS HEART RATE USUALLY 100 OR MORE  . Thyroid disease     Past Surgical History:  Procedure Laterality Date  . colonscopy     . ELBOW SURGERY     BILATERAL ELBOW SURGERY  . I&D EXTREMITY Right 01/02/2016   Procedure: IRRIGATION AND DEBRIDEMENT RIGHT TOTAL KNEE;  Surgeon: Allsion Nogales, MD;  [facility];  Service: Orthopedics;   Laterality: Right;  . I&D KNEE WITH POLY EXCHANGE Right 07/16/2015   Procedure: IRRIGATION AND DEBRIDEMENT RIGHT KNEE WITH POLY EXCHANGE AND PLACEMENT OF ANTIBIOTIC BEADS;  Surgeon: Brindley Madarang, MD;  Location[facility];  Service: Orthopedics;  Laterality: Right;  . KNEE ARTHROSCOPY Right 07/14/2015   Procedure: ARTHROSCOPY IRRIGATION AND DEBRIDEMENT - KNEE;  Surgeon: Kevin Supple, MD;  [facility];  Service: Orthopedics;  Laterality: Right;  . KNEE ARTHROSCOPY Right 12/29/2015   Procedure: ARTHROSCOPIC IRRIGATION AND DEBRIEDMENT RIGHT  KNEE;  Surgeon: Dahari Brooks, MD;  Location[facility];  Service: Orthopedics;  Laterality: Right;  . KNEE SURGERY     BILATERAL KNEE ARTHROSCOPY  . left TKR     July 2015  . LUMBAR LAMINECTOMY/DECOMPRESSION MICRODISCECTOMY Left 06/06/2014   Procedure: LEFT L4-5 DECOMPRESSION ;  Surgeon: Dahari Brooks, MD;  [facility];  Service: Orthopedics;  Laterality: Left;  . REFRACTIVE SURGERY    . SHOULDER SURGERY     RIGHT ROTATOR CUFF REPAIR AND LEFT ARTHROSCOPY  . TOTAL KNEE ARTHROPLASTY Left 08/23/2013   Procedure: LEFT TOTAL KNEE ARTHROPLASTY;  Surgeon: Biruk Troia A Estevan Kersh, MD;  Location[facility];  Service: Orthopedics;  Laterality: Left;  . TOTAL KNEE ARTHROPLASTY Right 02/07/2014     Procedure: RIGHT TOTAL KNEE ARTHROPLASTY;  Surgeon: Swan Zayed A Koby Hartfield, MD;  [facility];  Service: Orthopedics;  Laterality: Right;    Family History  Problem Relation Age of Onset  . Hypertension Mother   . Other Mother     varicose veins  . Hypertension Sister     Social History:  reports that he quit smoking about 17 years ago. His smoking use included Cigars. He has a 52.50 pack-year smoking history. He has never used smokeless tobacco. He reports that he uses drugs, including Cocaine. He reports that he does not drink alcohol.  Allergies:  Allergies  Allergen Reactions  . Heparin Other (See Comments)    Low platelets (130s), no HIT testing performed, platelets recovered.   . Wellbutrin [Bupropion] Other (See Comments)    Hallucinations-started on 150mg daily dosage    Medications: I have reviewed the patient's current medications.  Results for orders placed or performed during the hospital encounter of 01/14/16 (from the past 48 hour(s))  CBC with Differential     Status: Abnormal   Collection Time: 01/14/16 11:49 AM  Result Value Ref Range   WBC 12.7 (H) 4.0 - 10.5 K/uL   RBC 3.91 (L) 4.22 - 5.81 MIL/uL   Hemoglobin 12.7 (L) 13.0 - 17.0 g/dL   HCT 37.9 (L) 39.0 - 52.0 %   MCV 96.9 78.0 - 100.0 fL   MCH 32.5 26.0 - 34.0 pg   MCHC 33.5 30.0 - 36.0 g/dL   RDW 14.7 11.5 - 15.5 %   Platelets 334 150 - 400 K/uL   Neutrophils Relative % 73 %   Neutro Abs 9.4 (H) 1.7 - 7.7 K/uL   Lymphocytes Relative 15 %   Lymphs Abs 1.8 0.7 - 4.0 K/uL   Monocytes Relative 9 %   Monocytes Absolute 1.2 (H) 0.1 - 1.0 K/uL   Eosinophils Relative 2 %   Eosinophils Absolute 0.3 0.0 - 0.7 K/uL   Basophils Relative 1 %   Basophils Absolute 0.1 0.0 - 0.1 K/uL  Basic metabolic panel     Status: Abnormal   Collection Time: 01/14/16 11:49 AM  Result Value Ref Range   Sodium 134 (L) 135 - 145 mmol/L   Potassium 4.7 3.5 - 5.1 mmol/L   Chloride 103 101 - 111 mmol/L   CO2 25 22 - 32 mmol/L   Glucose, Bld 123 (H) 65 - 99 mg/dL   BUN 16 6 - 20 mg/dL   Creatinine, Ser 2.36 (H) 0.61 - 1.24 mg/dL   Calcium 8.4 (L) 8.9 - 10.3 mg/dL   GFR calc non Af Amer 28 (L) >60 mL/min   GFR calc Af Amer 32 (L) >60 mL/min    Comment: (NOTE) The eGFR has been calculated using the CKD EPI equation. This calculation has not been validated in all clinical situations. eGFR's persistently <60 mL/min signify possible Chronic Kidney Disease.    Anion gap 6 5 - 15  Sedimentation rate     Status: Abnormal   Collection Time: 01/14/16 11:49 AM  Result Value Ref Range   Sed Rate 55 (H) 0 - 16 mm/hr  C-reactive protein     Status: Abnormal   Collection Time: 01/14/16 11:49 AM  Result Value Ref Range    CRP 9.4 (H) <1.0 mg/dL    Dg Knee 2 Views Right  Result Date: 01/14/2016 CLINICAL DATA:  Staph infection RIGHT knee, swelling, redness, pain EXAM: RIGHT KNEE - 3 VIEW COMPARISON:  07/14/2015 FINDINGS: Mild osseous demineralization. Components of   RIGHT knee prosthesis again identified in expected positions. No acute fracture, dislocation, or bone destruction. No periprosthetic lucency identified. Mature well-formed periosteal new bone identified at the distal RIGHT femur, unchanged. Soft tissue swelling diffusely at the distal RIGHT thigh and RIGHT knee extending into the medial proximal RIGHT calf. No definite knee joint effusion. IMPRESSION: RIGHT knee prosthesis without acute complication. Soft tissue swelling RIGHT knee. Electronically Signed   By: Lavonia Dana M.D.   On: 01/14/2016 12:39    Review of Systems  Constitutional: Negative.   HENT: Negative.   Eyes: Negative.   Respiratory: Negative.   Cardiovascular: Negative.   Gastrointestinal: Negative.   Genitourinary: Negative.   Musculoskeletal: Positive for joint pain.  Skin: Negative.   Neurological: Negative.   Endo/Heme/Allergies: Negative.   Psychiatric/Behavioral: Negative.    Blood pressure (!) 149/71, pulse (!) 50, temperature 98.2 F (36.8 C), temperature source Oral, resp. rate 20, height 5' 11.5" (1.816 m), weight 83.5 kg (184 lb), SpO2 97 %. Physical Exam  Constitutional: He appears well-developed.  HENT:  Head: Normocephalic.  Eyes: Pupils are equal, round, and reactive to light.  Cardiovascular: Normal rate.   Respiratory: Effort normal.  GI: There is tenderness.  Musculoskeletal:  Right Knee effusion.    Assessment/Plan: Right knee effusion,mild and he is afebrile. I aspirated his Right Knee and the fluid was sent for C&S.The fluid was Sero-Sanguiness.I scheduled him for surgical Arthroscopic irrigation tomorrow.  Rowland Ericsson A 01/14/2016, 6:43 PM

## 2016-01-14 NOTE — ED Triage Notes (Signed)
Per Pt, Pt is coming from home with Hx of right knee surgery x2 two weeks ago. Pt has not had antibiotics due to mix up with medication. Pt reports inflammation, edema, and erythema noted to the right leg.

## 2016-01-14 NOTE — H&P (Signed)
Patient recently d/c from the hospital for right knee septic joint. He was sent home on A/B IM but he was unable to obtain medication at the pharmacy based on th compounding format. E has been off antibiotic for about 7-8 days. He denies fever. However his right knee is becoming more swollen, painful and red. He also endorsed right groin pain which he believes is related to his knee pain.   Physical exam significant for right knee swelling, mild erythema and tenderness.  Mild right suprapubic or peri-pubic tenderness.   A/P: Right knee septic joint: Restart IV A/B. Consult ortho for reassessment.   Obtain blood culture.    Pain control as needed.    Fall precaution.'    He will benefit from PT/OT assessment.    Monitor for improvement.  AKI: likely due to dehydration. We will hydrate him. Avoid nephrotoxic agents,  Bmet in the AM.   Will cosign resident's note when completed.

## 2016-01-14 NOTE — ED Notes (Signed)
Attempted report X1

## 2016-01-14 NOTE — Telephone Encounter (Signed)
This is a mess. He has not been on any iv abtx for his PJI since 13th, discharge. He reports knee inflammed, swollen. Recommend we get him to the ED for arthrocentesis and start IV abtx and dispo to SNF. This is not manageable for him to get daily infusion I don't think

## 2016-01-14 NOTE — Telephone Encounter (Signed)
Spoke with Juan Wiggins this AM about the need for him to go to the ED this AM. He said that he'll find a ride then head to the ED.

## 2016-01-14 NOTE — Progress Notes (Signed)
Patient trasfered from ED to [phone] via stretcher; alert and oriented x 4; complaints of pain in right knee; IV saline locked in right arm. Orient patient to room and unit; gave patient care guide; instructed how to use the call bell and  fall risk precautions. Will continue to monitor the patient.

## 2016-01-14 NOTE — H&P (Signed)
[facility] Admission History and Physical Service Pager: [phone]  Patient name: Juan Wiggins Medical record number: [id number] Date of birth: [birth year] Age: 65 y.o. Gender: male  Primary Care Provider: Helane Rima, MD Consultants: Orthopedics Code Status: FULL  Chief Complaint:  Septic arthritis of R knee  Assessment and Plan: Juan Wiggins is a 65 y.o. male presenting with right knee pain presenting with septic joint, readmit, has been off of antibiotics 8 days. PMH is significant for hypothyroidism, alcohol abuse, Hepatitis B, Hepatitis C,  HTN, GERD, chronic pain syndrome, hx of TKA (both in 2015), hx of R septic knee (06/2015), major depression, anxiety, BPH.    # Right knee septic joint. s/p arthroscopy with irrigation and debridement x2, was last admitted 11/5 - 11/13, discharged with plans for home ceftriaxone IM for 6w with home health twice weekly, however was unable to take this medication due to cost and the medication not being covered. Patient advised to come back into the hospital for treatment, finally presents today s/p 7d without antibiotics. 1/4 SIRS criteria on admission. Leukocytosis to 12.7.  Sed rate 55. CRP 9.4. S/p joint aspiration by ortho in the ED. - Admit to med-surg, attending Dr. Gwendlyn Deutscher - Ancef 2g q8h - f/u bcx, ucx, joint aspirate culture - UDS - ethanol level - orthopedics on board, will take him for arthroscopic irrigation on 11/22. - NPO at midnight - ID on board, appreciate assistance - pain control with morphine 223m Q3h PRN - Pt will likely need PT/OT consult after surgery  # AKI Cr 2.36 on admission (baseline 1.16). May be prerenal given recent immobility/possible poor hydration.  - IV NS 125 ml/hr - monitor AM Bmet  # RLQ pain. Patient denies urinary urgency or frequency, has had normal BMs, pain is in sacral ligament radiating to groin.  No hematuria, no anorexia. X2 episodes emesis 3d ago. No peritoneal signs  or fevers, so do not think this is appendicitis. Pt had a BM yesterday. - Will obtain UA - will monitor pain and for emesis - pain may radiation of right leg pain given infection  #history of substance abuse - patient denies alcohol or drug use at this time. History of alcohol use. +cocaine on his three most recent urine drug screens, though patient denies use. - repeat UDS  #Hypertension.  BP elevated to 146/53 on admission. Home regimen previously included propanolol which was discontinued at previous admission due to cocaine use.  - monitor BP - can start antihypertensive therapy as needed  #Hepatitis B, chronic and active.  At home on Viread 300 mg daily.  - Continue home Viread 300 mg daily - Monitor  #Hepatitis C, chronic and active.  At home on Sofosbuvir-Velpatasvir ([name].  - Continue home Epclusa 400-100 mg po tab daily  - Monitor  #Hypothyroidism.  Stable.  At home on Levothyroxine.  Last TSH 0.206 (12/29/2015).  - Continue home Synthroid 200 mcg daily - recheck TSH in 6 weeks  #difficulty sleeping . At home on Ambien 10 mg at bedtime.  - Ambien 5 mg PRN  #Depression . Stable.  At home on Lexapro and Remeron. Also on Ambien 112mPRN  -Continue Lexapro 20 mg daily at bedtime   #BPH: On Silodosin 23m57maily at home - flomax 0.4 mg Qd while hospitalized  #Chronic lumbar spine pain - stable. No neurologic deficits. - monitor  # ?Asthma - not indicated on problem list however albuterol on med list. - PRN albuterol  -  Will follow up whether pt uses this   FEN/GI: 125 ml/h IVNS, NPO until seen by surgery in case operation needed Disposition: Admit to med-surg FPTS. Patient will need discharge to SNF with PICC.  History of Present Illness:  Juan Wiggins is a 65 y.o. male presenting with right knee septic joint.    Prior to Admission: Patient was previously admitted to the hospital for septic joint and underwent arthoscopy x2 with orthopedics on 11/5 and 11/9.   At discharge, plan was for patient to go to SNF for ancef x6w antibiotic therapy, however patient vehemently refused SNF, feeling he could not pay for it. However, in light of his history of drug use, patient not a candidate for PICC line at home. Alternative sub-optimal therapies were considered including linezolid (unfavorable adverse reaction profile including potential for irreversible nerve damage when used with this patient's psych medications, also no data to support this as therapy for septic joint) vs. Intramuscular ceftriaxone to be given daily.  Patient opted for intramuscular ceftriaxone, however endorsed that it would not be possible for him to present to a facility for medication administration daily due to lack of transportation. It was decided that the patient would be discharged with ceftriaxone IM daily x 6w with home health RN services bi-weekly to help him take this medication. Patient was educated by nursing staff on administration of the IM CTX prior to discharge.   After discharge there was difficulty with obtaining the ceftriaxone medications and home health assistance as it had been coordinated with case management at discharge. FPTS received a call from patient's insurance representative on Friday 11/17 stating that they will not cover IM self-administered injections for the patient. Therefore patient has not received any antibiotics since discharge from the hospital on 11/13. They wanted to see if we could coordinate patient getting set up with a short stay to receive injections. This was an option at discharge but patient said that transportation was going to be an issue so declined this option along with the SNF placement. Per case management, we were informed that it would take about 1 week to get patient set up in short stay. FPTS resident advised over the phone to please come into the ED for antibiotics, and finally he presents today.  HPI: Patient reports that he has had  worsening redness and swelling of the right leg since discharge.  Redness has developed creeping down his right leg starting about 3 days ago.  He states it is sometimes so painful that he cannot walk. He states "it has become a chore to walk". No fevers, no chills. Had a two episodes of vomiting Saturday morning and Sunday afternoon. Patient endorses right-sided groin/abdominal pain which he feels may be related to the right leg pain. Denies nausea, diarrhea or constipation.   In the ED, Pt was afebrile, normotensive/hypertensive, with bradycardia to the high 40s-low 50s. Labs were significant for WBC 12.7, Cr 2.36, CRP 9.4, ESR 55. Blood cultures were drawn and patient was started on Ancef. Right knee x-ray showed soft tissue swelling. Orthopedic surgery was consulted. Right knee aspiration was performed and the fluid was sent for analysis. He was scheduled for surgical arthroscopic irrigation tomorrow.  Review Of Systems: Per HPI with the following additions:   Review of Systems  Constitutional: Positive for malaise/fatigue. Negative for chills and fever.  HENT: Negative for ear pain and sore throat.   Eyes: Negative for blurred vision and double vision.  Respiratory: Negative for cough and sputum  production.   Cardiovascular: Negative for chest pain and palpitations.  Gastrointestinal: Positive for nausea and vomiting.  Genitourinary: Negative for dysuria and frequency.  Musculoskeletal: Positive for back pain and joint pain.  Neurological: Negative for dizziness and headaches.  Psychiatric/Behavioral: Positive for depression. The patient is nervous/anxious.     Patient Active Problem List   Diagnosis Date Noted  . Septic arthritis ([place]) 01/14/2016  . Lumbar spine pain   . Difficulty in walking, not elsewhere classified   . Infection of total knee replacement ([place]) 01/02/2016  . Pleural effusion   . Left upper quadrant pain   . Pain   . Hepatitis B infection without delta agent  without hepatic coma   . Sepsis ([place]) 12/29/2015  . Lactic acidosis   . Pyogenic arthritis of right knee joint ([place]) 07/18/2015  . Bacteremia due to Streptococcus 07/18/2015  . Cellulitis of right upper extremity 07/18/2015  . Hepatitis C virus infection without hepatic coma 07/18/2015  . Anemia 07/18/2015  . Opacity of lung on imaging study, bilateral 07/18/2015  . Anxiety state 07/18/2015  . History of total knee arthroplasty 07/16/2015  . Severe sepsis ([place]) 07/14/2015  . Moderate episode of recurrent major depressive disorder ([place])   . Chronic pain syndrome 05/21/2014  . Essential hypertension, benign 08/28/2013  . GERD (gastroesophageal reflux disease) 08/28/2013  . Osteoarthritis of left knee 08/23/2013  . Total knee replacement status 08/23/2013  . Alcohol dependence ([place]) 07/14/2013  . Alcohol dependency ([place]) 07/07/2013  . Major depression 07/07/2013  . Varicose veins of lower extremities with other complications 92/42/6834  . Recurrent falls 04/14/2011  . Hypotension 04/13/2011  . ARF (acute renal failure) ([place]) 04/13/2011  . Hypothyroidism 04/13/2011  . Prolapsed and thrombosed 09/03/2010    Past Medical History: Past Medical History:  Diagnosis Date  . Anxiety   . Arthritis    RHEUMATOID ARTHRITIS; OA LEFT KNEE  . Cancer (HCC)    MELANOMA REMOVED RT SHOULDER  . Depression   . GERD (gastroesophageal reflux disease)    PREVACID IF NEEDED  . Heart murmur   . Hemorrhoids   . Hepatitis A    PT STATES TYPE OF HEPATITIS YOU GET FROM SHELLFISH  . Hepatitis B   . Hepatitis C   . Hypertension   . Hypothyroidism   . 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 Pancreatitis, alcoholic, acute   . Sleep apnea    CLAUSTROPHOBIC - COULD NOT TOLERATE CPAP MASK  . Tachycardia    PT STATES HIS HEART RATE USUALLY 100 OR MORE  . Thyroid disease     Past Surgical History: Past Surgical History:  Procedure Laterality Date  .  colonscopy     . ELBOW SURGERY     BILATERAL ELBOW SURGERY  . I&D EXTREMITY Right 01/02/2016   Procedure: IRRIGATION AND DEBRIDEMENT RIGHT TOTAL KNEE;  Surgeon: Latanya Maudlin, MD;  Location: [place];  Service: Orthopedics;  Laterality: Right;  . I&D KNEE WITH POLY EXCHANGE Right 07/16/2015   Procedure: IRRIGATION AND DEBRIDEMENT RIGHT KNEE WITH POLY EXCHANGE AND PLACEMENT OF ANTIBIOTIC BEADS;  Surgeon: Latanya Maudlin, MD;  Location: [place];  Service: Orthopedics;  Laterality: Right;  . KNEE ARTHROSCOPY Right 07/14/2015   Procedure: ARTHROSCOPY IRRIGATION AND DEBRIDEMENT - KNEE;  Surgeon: Justice Britain, MD;  Location: [place];  Service: Orthopedics;  Laterality: Right;  . KNEE ARTHROSCOPY Right 12/29/2015   Procedure: ARTHROSCOPIC IRRIGATION AND DEBRIEDMENT RIGHT  KNEE;  Surgeon: Melina Schools, MD;  Location: [place];  Service: Orthopedics;  Laterality: Right;  . KNEE SURGERY     BILATERAL KNEE ARTHROSCOPY  . left TKR     July 2015  . LUMBAR LAMINECTOMY/DECOMPRESSION MICRODISCECTOMY Left 06/06/2014   Procedure: LEFT L4-5 DECOMPRESSION ;  Surgeon: Melina Schools, MD;  Location: [place];  Service: Orthopedics;  Laterality: Left;  . REFRACTIVE SURGERY    . SHOULDER SURGERY     RIGHT ROTATOR CUFF REPAIR AND LEFT ARTHROSCOPY  . TOTAL KNEE ARTHROPLASTY Left 08/23/2013   Procedure: LEFT TOTAL KNEE ARTHROPLASTY;  Surgeon: Tobi Bastos, MD;  Location: [facility];  Service: Orthopedics;  Laterality: Left;  . TOTAL KNEE ARTHROPLASTY Right 02/07/2014   Procedure: RIGHT TOTAL KNEE ARTHROPLASTY;  Surgeon: Tobi Bastos, MD;  Location: [facility];  Service: Orthopedics;  Laterality: Right;    Social History: Social History  Substance Use Topics  . Smoking status: Former Smoker    Packs/day: 1.50    Years: 35.00    Types: Cigars    Quit date: 02/22/1998  . Smokeless tobacco: Never Used     Comment: used cigarettes 20 years ago  . Alcohol use No     Comment: " last alcohol use was in December 2015."   Additional  social history:   Please also refer to relevant sections of EMR.  Family History: Family History  Problem Relation Age of Onset  . Hypertension Mother   . Other Mother     varicose veins  . Hypertension Sister     Allergies and Medications: Allergies  Allergen Reactions  . Heparin Other (See Comments)    Low platelets (130s), no HIT testing performed, platelets recovered.  . Wellbutrin [Bupropion] Other (See Comments)    Hallucinations-started on 15m daily dosage   No current facility-administered medications on file prior to encounter.    Current Outpatient Prescriptions on File Prior to Encounter  Medication Sig Dispense Refill  . aspirin 325 MG tablet Take 1 tablet (325 mg total) by mouth 2 (two) times daily. (Patient taking differently: Take 325 mg by mouth daily. ) 60 tablet 0  . bisacodyl (DULCOLAX) 5 MG EC tablet Take 1 tablet (5 mg total) by mouth daily as needed for moderate constipation. 30 tablet 0  . Cholecalciferol 2000 units CAPS Take 6,000 Units by mouth daily.     .Marland Kitchenescitalopram (LEXAPRO) 20 MG tablet Take 20 mg by mouth daily.     . hydrOXYzine (ATARAX/VISTARIL) 25 MG tablet Take 1 tablet (25 mg total) by mouth 3 (three) times daily as needed. (Patient taking differently: Take 25 mg by mouth 3 (three) times daily. ) 60 tablet 0  . levothyroxine (SYNTHROID, LEVOTHROID) 200 MCG tablet Take 200 mcg by mouth daily before breakfast.    . methocarbamol (ROBAXIN) 500 MG tablet Take 1 tablet (500 mg total) by mouth every 6 (six) hours as needed for muscle spasms. 40 tablet 1  . ondansetron (ZOFRAN) 4 MG tablet TAKE 1 TABLET BY MOUTH EVERY 8 HOURS AS NEEDED FOR NAUSEA OR VOMITING. 20 tablet 0  . oxyCODONE (OXY IR/ROXICODONE) 5 MG immediate release tablet Take 1-2 tablets (5-10 mg total) by mouth every 4 (four) hours as needed for moderate pain. (Patient taking differently: Take 10 mg by mouth every 4 (four) hours as needed for moderate pain. ) 80 tablet 0  . promethazine  (PHENERGAN) 12.5 MG tablet Take 25 mg by mouth every 6 (six) hours as needed for nausea or vomiting.     .Marland Kitchen  silodosin (RAPAFLO) 8 MG CAPS capsule Take 8 mg by mouth daily with breakfast.     . Sofosbuvir-Velpatasvir (EPCLUSA) 400-100 MG TABS Take 1 tablet by mouth daily. 28 tablet 2  . tenofovir (VIREAD) 300 MG tablet Take 1 tablet (300 mg total) by mouth daily. 30 tablet 6  . zolpidem (AMBIEN) 10 MG tablet Take 1 tablet (10 mg total) by mouth at bedtime as needed for sleep. (Patient taking differently: Take 10 mg by mouth at bedtime. ) 30 tablet 0  . albuterol (PROVENTIL HFA;VENTOLIN HFA) 108 (90 Base) MCG/ACT inhaler Inhale 2-4 puffs into the lungs daily as needed for shortness of breath.     . cefTRIAXone (ROCEPHIN) 1 g injection Inject 2 g into the muscle daily. 35 each 0  . valACYclovir (VALTREX) 1000 MG tablet Take 1,000 mg by mouth See admin instructions. Take 1 tablet by mouth daily for flares - until one day past outbreak  2    Objective: BP 137/74 (BP Location: Right Arm)   Pulse (!) 59   Temp 98 F (36.7 C) (Oral)   Resp 18   Ht 5' 11.5" (1.816 m)   Wt 83.5 kg (184 lb)   SpO2 99%   BMI 25.31 kg/m  Exam: General: NAD, rests comfortably in bed, appears stated age Eyes: PERRL, EOMI, no scleral icterus, no conjunctival pallor or injection ENTM: No rhinorrhea or congestion, oropharynx clear, mucous membranes moist Neck: supple Cardiovascular: RRR, m/r/g Respiratory: CTA bil, no W/R/R Gastrointestinal: +RLQ tenderness to palpation with voluntary guarding, also mild tenderness in epigastric region, no rebound, no involuntary guarding, normoactive BS, no hepatosplenomegaly, no skin changes MSK: +right knee warmth redness and edema, sutures still in place, streaking down right knee. Picture below.  2+ peripheral pulses bilaterally Derm: No rashes appreciated Neuro: CN II-XII grossly intact Psych: Affect appropriate, thought process linear, AAOx3        Labs and Imaging: CBC  BMET   Recent Labs Lab 01/14/16 1149  WBC 12.7*  HGB 12.7*  HCT 37.9*  PLT 334    Recent Labs Lab 01/14/16 1149  NA 134*  K 4.7  CL 103  CO2 25  BUN 16  CREATININE 2.36*  GLUCOSE 123*  CALCIUM 8.4*     [name], MD 01/14/2016, 1:36 PM PGY-1, [facility] Intern pager: [phone], text pages welcome  FPTS Upper-Level Resident Addendum  I have independently interviewed and examined the patient. I have discussed the above with the original author and agree with their documentation. My edits for correction/addition/clarification are in blue. Please see also any attending notes.   Hyman Bible, MD PGY-2, [facility] pager: [phone] (text pages welcome through [place])

## 2016-01-14 NOTE — Progress Notes (Deleted)
[place] MD/PA/NP OP Progress Note  01/14/2016 8:41 AM Juan Wiggins  MRN:  [id number]  Chief Complaint:   Subjective:  "I will go to jail"  HPI:  - Since the last visit, patient was admitted for sepsis; UDS positive for benzo, opiates, cocaine on 12/2015.    Patient presents for follow-up appointment. Since the last encounter patient discontinued Wellbutrin given he had vomiting. He denies any nausea or vomiting since then. He continues to feel anxious and depressed about his divorce and upcoming legal issues; he states that he needs to go to jail on this Friday due to DWI for a week. He wants to have some medication for his anxiety, as he won't be able to take any controlled substance.  He has insomnia and takes Ambien 5-10 mg. He states he ran out of medication (which was dispensed for 30 days); he states that he might have spilled over some of medication. He has passive SI, although he denies intent or plans. He denies gun access at home. He denies AH, VH, except the time he saw faces when he was on Wellbutrin. He occasionally takes Xanax 0.25 mg at night for insomnia.  Visit Diagnosis:  No diagnosis found.  Past Psychiatric History:  Never seen psychiatrist, no previous admission  Past Medical History:  Past Medical History:  Diagnosis Date  . Anxiety   . Arthritis    RHEUMATOID ARTHRITIS; OA LEFT KNEE  . Cancer (HCC)    MELANOMA REMOVED RT SHOULDER  . Depression   . GERD (gastroesophageal reflux disease)    PREVACID IF NEEDED  . Heart murmur   . Hemorrhoids   . Hepatitis A    PT STATES TYPE OF HEPATITIS YOU GET FROM SHELLFISH  . Hepatitis B   . Hepatitis C   . Hypertension   . Hypothyroidism   . 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 Pancreatitis, alcoholic, acute   . Sleep apnea    CLAUSTROPHOBIC - COULD NOT TOLERATE CPAP MASK  . Tachycardia    PT STATES HIS HEART RATE USUALLY 100 OR MORE  . Thyroid disease     Past Surgical  History:  Procedure Laterality Date  . colonscopy     . ELBOW SURGERY     BILATERAL ELBOW SURGERY  . I&D EXTREMITY Right 01/02/2016   Procedure: IRRIGATION AND DEBRIDEMENT RIGHT TOTAL KNEE;  Surgeon: Latanya Maudlin, MD;  Location: [facility];  Service: Orthopedics;  Laterality: Right;  . I&D KNEE WITH POLY EXCHANGE Right 07/16/2015   Procedure: IRRIGATION AND DEBRIDEMENT RIGHT KNEE WITH POLY EXCHANGE AND PLACEMENT OF ANTIBIOTIC BEADS;  Surgeon: Latanya Maudlin, MD;  Location: [facility];  Service: Orthopedics;  Laterality: Right;  . KNEE ARTHROSCOPY Right 07/14/2015   Procedure: ARTHROSCOPY IRRIGATION AND DEBRIDEMENT - KNEE;  Surgeon: Justice Britain, MD;  Location: [facility];  Service: Orthopedics;  Laterality: Right;  . KNEE ARTHROSCOPY Right 12/29/2015   Procedure: ARTHROSCOPIC IRRIGATION AND DEBRIEDMENT RIGHT  KNEE;  Surgeon: Melina Schools, MD;  Location: [facility];  Service: Orthopedics;  Laterality: Right;  . KNEE SURGERY     BILATERAL KNEE ARTHROSCOPY  . left TKR     July 2015  . LUMBAR LAMINECTOMY/DECOMPRESSION MICRODISCECTOMY Left 06/06/2014   Procedure: LEFT L4-5 DECOMPRESSION ;  Surgeon: Melina Schools, MD;  Location: [facility];  Service: Orthopedics;  Laterality: Left;  . REFRACTIVE SURGERY    . SHOULDER SURGERY     RIGHT ROTATOR CUFF REPAIR  AND LEFT ARTHROSCOPY  . TOTAL KNEE ARTHROPLASTY Left 08/23/2013   Procedure: LEFT TOTAL KNEE ARTHROPLASTY;  Surgeon: Tobi Bastos, MD;  Location: [facility];  Service: Orthopedics;  Laterality: Left;  . TOTAL KNEE ARTHROPLASTY Right 02/07/2014   Procedure: RIGHT TOTAL KNEE ARTHROPLASTY;  Surgeon: Tobi Bastos, MD;  Location: [facility];  Service: Orthopedics;  Laterality: Right;    Family Psychiatric History: denies mental health issues, denies suicide attempt  Family History:  Family History  Problem Relation Age of Onset  . Hypertension Mother   . Other Mother     varicose veins  . Hypertension Sister     Social History:  Social History   Social History  .  Marital status: Legally Separated    Spouse name: N/A  . Number of children: N/A  . Years of education: N/A   Social History Main Topics  . Smoking status: Former Smoker    Packs/day: 1.50    Years: 35.00    Types: Cigars    Quit date: 02/22/1998  . Smokeless tobacco: Never Used     Comment: used cigarettes 20 years ago  . Alcohol use No     Comment: " last alcohol use was in December 2015."  . Drug use: No  . Sexual activity: Yes    Birth control/ protection: None   Other Topics Concern  . Not on file   Social History Narrative  . No narrative on file    Allergies:  Allergies  Allergen Reactions  . Heparin Other (See Comments)    Low platelets (130s), no HIT testing performed, platelets recovered.  . Wellbutrin [Bupropion] Other (See Comments)    Hallucinations-started on 150mg  daily dosage    Metabolic Disorder Labs: No results found for: HGBA1C, MPG No results found for: PROLACTIN Lab Results  Component Value Date   TRIG 88 05/22/2014     Current Medications: Current Outpatient Prescriptions  Medication Sig Dispense Refill  . acetaminophen (TYLENOL) 500 MG tablet Take 500-1,500 mg by mouth every 6 (six) hours as needed for headache (pain).    Marland Kitchen albuterol (PROVENTIL HFA;VENTOLIN HFA) 108 (90 Base) MCG/ACT inhaler Inhale 2-4 puffs into the lungs daily as needed for shortness of breath.     Marland Kitchen aspirin 325 MG tablet Take 1 tablet (325 mg total) by mouth 2 (two) times daily. (Patient taking differently: Take 325 mg by mouth daily. ) 60 tablet 0  . bisacodyl (DULCOLAX) 5 MG EC tablet Take 1 tablet (5 mg total) by mouth daily as needed for moderate constipation. 30 tablet 0  . cefTRIAXone (ROCEPHIN) 1 g injection Inject 2 g into the muscle daily. 35 each 0  . Cholecalciferol 2000 units CAPS Take 6,000 Units by mouth daily.     Marland Kitchen escitalopram (LEXAPRO) 20 MG tablet Take 20 mg by mouth at bedtime.     . hydrOXYzine (ATARAX/VISTARIL) 25 MG tablet Take 1 tablet (25 mg  total) by mouth 3 (three) times daily as needed. (Patient taking differently: Take 25 mg by mouth 3 (three) times daily. ) 60 tablet 0  . levothyroxine (SYNTHROID, LEVOTHROID) 200 MCG tablet Take 200 mcg by mouth daily before breakfast.    . methocarbamol (ROBAXIN) 500 MG tablet Take 1 tablet (500 mg total) by mouth every 6 (six) hours as needed for muscle spasms. 40 tablet 1  . ondansetron (ZOFRAN) 4 MG tablet Take 1 tablet (4 mg total) by mouth every 8 (eight) hours as needed for nausea or vomiting. 20 tablet 0  .  oxyCODONE (OXY IR/ROXICODONE) 5 MG immediate release tablet Take 1-2 tablets (5-10 mg total) by mouth every 4 (four) hours as needed for moderate pain. 80 tablet 0  . promethazine (PHENERGAN) 12.5 MG tablet Take 25 mg by mouth every 6 (six) hours as needed for nausea or vomiting.     . silodosin (RAPAFLO) 8 MG CAPS capsule Take 8 mg by mouth daily with breakfast.     . Sofosbuvir-Velpatasvir (EPCLUSA) 400-100 MG TABS Take 1 tablet by mouth daily. 28 tablet 2  . tenofovir (VIREAD) 300 MG tablet Take 1 tablet (300 mg total) by mouth daily. 30 tablet 6  . valACYclovir (VALTREX) 1000 MG tablet Take 1,000 mg by mouth See admin instructions. Take 1 tablet by mouth daily for flares - until one day past outbreak  2  . zolpidem (AMBIEN) 10 MG tablet Take 1 tablet (10 mg total) by mouth at bedtime as needed for sleep. (Patient taking differently: Take 10 mg by mouth at bedtime. ) 30 tablet 0   No current facility-administered medications for this visit.     Neurologic: Headache: No Seizure: No Paresthesias: No  Musculoskeletal: Strength & Muscle Tone: within normal limits Gait & Station: normal Patient leans: N/A  Psychiatric Specialty Exam: Review of Systems  Psychiatric/Behavioral: Positive for depression and suicidal ideas. Negative for hallucinations and substance abuse. The patient is nervous/anxious and has insomnia.   All other systems reviewed and are negative.   There were no  vitals taken for this visit.There is no height or weight on file to calculate BMI.  General Appearance: Fairly Groomed  Eye Contact:  Good  Speech:  Clear and Coherent  Volume:  Normal  Mood:  Depressed  Affect:  down  Thought Process:  Coherent and Goal Directed  Orientation:  Full (Time, Place, and Person)  Thought Content: Logical   Suicidal Thoughts:  Yes.  without intent/plan  Homicidal Thoughts:  No  Memory:  Immediate;   Good Recent;   Good Remote;   Good  Judgement:  Fair  Insight:  Fair  Psychomotor Activity:  Normal  Concentration:  Concentration: Good and Attention Span: Good  Recall:  Good  Fund of Knowledge: Good  Language: Good  Akathisia:  NA  Handed:  Right  AIMS (if indicated):  N/A  Assets:  Communication Skills Desire for Improvement  ADL's:  Intact  Cognition: WNL  Sleep:  poor   Assessment 65 yo M with  depression, alcohol use disorder in sustained remission, history of chronic hepatitis C, recently diagnosed Hep B,  streptococcal prosthetic joint infection, who presented for follow up appointment.   # MDD, recurrent, moderate without psychotic features Patient continues to endorse neurovegetative symptoms in the setting of marital discordance and upcoming legal issues with DWI. Will add mirtazapine to target his mood and insomnia. Will continue to discuss option of CBT when he is returned from jail.   # Insomnia Discussed sleep hygiene. Will start mirtazapine as above, which will be beneficial for his insomnia. Discussed with patient again not to continue Xanax given its potential risk of physical dependence; patient agreed with this plan. Continue Ambien at this time with plan to prescribe for a limited time.   # Alcohol use disorder, sustained remission Patient has been in sobriety for more than 2.5 years. Will continue to monitor and do motivational interview as needed.    Treatment Plan Summary: as below  1. Continue Lexapro 20 mg daily 2.  Start mirtazapine 15 mg at night 3. Start  hydroxyzine 25 mg three times a day as needed for anxiety 4. Continue Ambien 5-10 mg at night as needed for sleep  4. Return to clinic in one month  The patient demonstrates the following risk factors for suicide: Chronic risk factors for suicide include: psychiatric disorder of depression, substance use disorder and demographic factors (male, >64 y/o). Acute risk factors for suicide include: family or marital conflict, social withdrawal/isolation and loss (financial, interpersonal, professional). Protective factors for this patient include: coping skills and hope for the future. Considering these factors, the overall suicide risk at this point appears to be low. Patient is appropriate for outpatient follow up.   Norman Clay, MD 01/14/2016, 8:41 AM

## 2016-01-14 NOTE — ED Notes (Signed)
Pt asked for food and drink. Informed Dr. Tyrone Nine.

## 2016-01-14 NOTE — ED Notes (Addendum)
Admitting MD at bedside.

## 2016-01-15 ENCOUNTER — Inpatient Hospital Stay (HOSPITAL_COMMUNITY): Payer: 59 | Admitting: Anesthesiology

## 2016-01-15 ENCOUNTER — Encounter (HOSPITAL_COMMUNITY)
Admission: EM | Disposition: A | Discharge status: Skilled Nursing Facility | Payer: Self-pay | Source: Home / Self Care | Attending: Orthopedic Surgery

## 2016-01-15 DIAGNOSIS — M00061 Staphylococcal arthritis, right knee: Secondary | ICD-10-CM

## 2016-01-15 DIAGNOSIS — M009 Pyogenic arthritis, unspecified: Secondary | ICD-10-CM

## 2016-01-15 HISTORY — PX: KNEE ARTHROSCOPY: SHX127

## 2016-01-15 LAB — SURGICAL PCR SCREEN
MRSA, PCR: NEGATIVE
STAPHYLOCOCCUS AUREUS: NEGATIVE

## 2016-01-15 LAB — CBC
HCT: 37.5 % — ABNORMAL LOW (ref 39.0–52.0)
Hemoglobin: 12.5 g/dL — ABNORMAL LOW (ref 13.0–17.0)
MCH: 32.6 pg (ref 26.0–34.0)
MCHC: 33.3 g/dL (ref 30.0–36.0)
MCV: 97.9 fL (ref 78.0–100.0)
Platelets: 354 10*3/uL (ref 150–400)
RBC: 3.83 MIL/uL — ABNORMAL LOW (ref 4.22–5.81)
RDW: 14.7 % (ref 11.5–15.5)
WBC: 11.7 10*3/uL — ABNORMAL HIGH (ref 4.0–10.5)

## 2016-01-15 LAB — BASIC METABOLIC PANEL
Anion gap: 6 (ref 5–15)
BUN: 14 mg/dL (ref 6–20)
CALCIUM: 8.3 mg/dL — AB (ref 8.9–10.3)
CO2: 24 mmol/L (ref 22–32)
CREATININE: 2.06 mg/dL — AB (ref 0.61–1.24)
Chloride: 107 mmol/L (ref 101–111)
GFR calc Af Amer: 38 mL/min — ABNORMAL LOW (ref >60)
GFR, EST NON AFRICAN AMERICAN: 33 mL/min — AB (ref >60)
GLUCOSE: 84 mg/dL (ref 65–99)
Potassium: 4.6 mmol/L (ref 3.5–5.1)
Sodium: 137 mmol/L (ref 135–145)

## 2016-01-15 SURGERY — ARTHROSCOPY, KNEE
Anesthesia: General | Site: Knee | Laterality: Right

## 2016-01-15 MED ORDER — ASPIRIN EC 325 MG PO TBEC
325.0000 mg | DELAYED_RELEASE_TABLET | Freq: Every day | ORAL | Status: DC
  Administered 2016-01-16 – 2016-01-24 (×9): 325 mg via ORAL
  Filled 2016-01-15 (×9): qty 1

## 2016-01-15 MED ORDER — MIDAZOLAM HCL 2 MG/2ML IJ SOLN
INTRAMUSCULAR | Status: AC
  Filled 2016-01-15: qty 2

## 2016-01-15 MED ORDER — METHOCARBAMOL 500 MG PO TABS
500.0000 mg | ORAL_TABLET | Freq: Four times a day (QID) | ORAL | Status: DC | PRN
  Filled 2016-01-15: qty 1

## 2016-01-15 MED ORDER — FENTANYL CITRATE (PF) 100 MCG/2ML IJ SOLN
INTRAMUSCULAR | Status: AC
  Filled 2016-01-15: qty 2

## 2016-01-15 MED ORDER — LACTATED RINGERS IV SOLN
INTRAVENOUS | Status: DC
  Administered 2016-01-21: 16:00:00 via INTRAVENOUS

## 2016-01-15 MED ORDER — OXYCODONE-ACETAMINOPHEN 5-325 MG PO TABS
2.0000 | ORAL_TABLET | ORAL | Status: DC | PRN
  Administered 2016-01-15 – 2016-01-17 (×6): 2 via ORAL
  Filled 2016-01-15 (×7): qty 2

## 2016-01-15 MED ORDER — HYDROCODONE-ACETAMINOPHEN 5-325 MG PO TABS
1.0000 | ORAL_TABLET | ORAL | Status: DC | PRN
  Administered 2016-01-17 – 2016-01-20 (×8): 2 via ORAL
  Filled 2016-01-15 (×11): qty 2

## 2016-01-15 MED ORDER — ACETAMINOPHEN 650 MG RE SUPP
650.0000 mg | Freq: Four times a day (QID) | RECTAL | Status: DC | PRN
Start: 2016-01-15 — End: 2016-01-21

## 2016-01-15 MED ORDER — HYDROMORPHONE HCL 2 MG/ML IJ SOLN: 1.0000 mg | INTRAMUSCULAR | Status: DC | PRN

## 2016-01-15 MED ORDER — ONDANSETRON HCL 4 MG/2ML IJ SOLN
INTRAMUSCULAR | Status: DC | PRN
  Administered 2016-01-15: 4 mg via INTRAVENOUS

## 2016-01-15 MED ORDER — MEPERIDINE HCL 25 MG/ML IJ SOLN: 6.2500 mg | INTRAMUSCULAR | Status: DC | PRN

## 2016-01-15 MED ORDER — DEXTROSE 5 % IV SOLN: 500.0000 mg | Freq: Four times a day (QID) | INTRAVENOUS | Status: DC | PRN

## 2016-01-15 MED ORDER — LACTATED RINGERS IV SOLN: INTRAVENOUS | Status: DC

## 2016-01-15 MED ORDER — BUPIVACAINE-EPINEPHRINE 0.25% -1:200000 IJ SOLN
INTRAMUSCULAR | Status: DC | PRN
  Administered 2016-01-15: 20 mL

## 2016-01-15 MED ORDER — ONDANSETRON HCL 4 MG PO TABS
4.0000 mg | ORAL_TABLET | Freq: Four times a day (QID) | ORAL | Status: DC | PRN
  Administered 2016-01-20: 4 mg via ORAL
  Filled 2016-01-15 (×2): qty 1

## 2016-01-15 MED ORDER — OXYCODONE HCL 5 MG/5ML PO SOLN: 5.0000 mg | Freq: Once | ORAL | Status: DC | PRN

## 2016-01-15 MED ORDER — PROPOFOL 10 MG/ML IV BOLUS
INTRAVENOUS | Status: DC | PRN
  Administered 2016-01-15: 200 mg via INTRAVENOUS

## 2016-01-15 MED ORDER — FLEET ENEMA 7-19 GM/118ML RE ENEM: 1.0000 | ENEMA | Freq: Once | RECTAL | Status: DC | PRN

## 2016-01-15 MED ORDER — MENTHOL 3 MG MT LOZG: 1.0000 | LOZENGE | OROMUCOSAL | Status: DC | PRN

## 2016-01-15 MED ORDER — HYDROMORPHONE HCL 1 MG/ML IJ SOLN: 0.2500 mg | INTRAMUSCULAR | Status: DC | PRN

## 2016-01-15 MED ORDER — ACETAMINOPHEN 325 MG PO TABS: 650.0000 mg | ORAL_TABLET | Freq: Four times a day (QID) | ORAL | Status: DC | PRN

## 2016-01-15 MED ORDER — PROPOFOL 10 MG/ML IV BOLUS
INTRAVENOUS | Status: AC
  Filled 2016-01-15: qty 40

## 2016-01-15 MED ORDER — CEFAZOLIN IN D5W 1 GM/50ML IV SOLN
1.0000 g | Freq: Four times a day (QID) | INTRAVENOUS | Status: DC
  Filled 2016-01-15 (×3): qty 50

## 2016-01-15 MED ORDER — TENOFOVIR DISOPROXIL FUMARATE 300 MG PO TABS
300.0000 mg | ORAL_TABLET | ORAL | Status: DC
  Administered 2016-01-17 – 2016-01-19 (×2): 300 mg via ORAL
  Filled 2016-01-15 (×2): qty 1

## 2016-01-15 MED ORDER — BUPIVACAINE HCL (PF) 0.25 % IJ SOLN
INTRAMUSCULAR | Status: AC
  Filled 2016-01-15: qty 30

## 2016-01-15 MED ORDER — METHOCARBAMOL 1000 MG/10ML IJ SOLN
500.0000 mg | Freq: Four times a day (QID) | INTRAVENOUS | Status: DC | PRN
  Filled 2016-01-15 (×3): qty 5

## 2016-01-15 MED ORDER — SODIUM CHLORIDE 0.9 % IR SOLN
Status: DC | PRN
  Administered 2016-01-15: 1

## 2016-01-15 MED ORDER — METOCLOPRAMIDE HCL 10 MG PO TABS
5.0000 mg | ORAL_TABLET | Freq: Three times a day (TID) | ORAL | Status: DC | PRN
  Administered 2016-01-19: 10 mg via ORAL
  Filled 2016-01-15: qty 1

## 2016-01-15 MED ORDER — METHOCARBAMOL 500 MG PO TABS: 500.0000 mg | ORAL_TABLET | Freq: Four times a day (QID) | ORAL | Status: DC | PRN

## 2016-01-15 MED ORDER — ACETAMINOPHEN 325 MG PO TABS
650.0000 mg | ORAL_TABLET | Freq: Four times a day (QID) | ORAL | Status: DC | PRN
Start: 2016-01-15 — End: 2016-01-15

## 2016-01-15 MED ORDER — EPINEPHRINE PF 1 MG/ML IJ SOLN
INTRAMUSCULAR | Status: AC
  Filled 2016-01-15: qty 1

## 2016-01-15 MED ORDER — PHENOL 1.4 % MT LIQD: 1.0000 | OROMUCOSAL | Status: DC | PRN

## 2016-01-15 MED ORDER — CEFAZOLIN SODIUM-DEXTROSE 2-4 GM/100ML-% IV SOLN
2.0000 g | Freq: Three times a day (TID) | INTRAVENOUS | Status: DC
  Administered 2016-01-15 – 2016-01-16 (×3): 2 g via INTRAVENOUS
  Filled 2016-01-15 (×5): qty 100

## 2016-01-15 MED ORDER — ONDANSETRON HCL 4 MG/2ML IJ SOLN
4.0000 mg | Freq: Four times a day (QID) | INTRAMUSCULAR | Status: DC | PRN
  Administered 2016-01-16 – 2016-01-24 (×17): 4 mg via INTRAVENOUS
  Filled 2016-01-15 (×18): qty 2

## 2016-01-15 MED ORDER — FERROUS SULFATE 325 (65 FE) MG PO TABS: 325.0000 mg | ORAL_TABLET | Freq: Three times a day (TID) | ORAL | Status: DC

## 2016-01-15 MED ORDER — BISACODYL 5 MG PO TBEC: 5.0000 mg | DELAYED_RELEASE_TABLET | Freq: Every day | ORAL | Status: DC | PRN

## 2016-01-15 MED ORDER — LACTATED RINGERS IV SOLN
INTRAVENOUS | Status: DC
  Administered 2016-01-15: 15:00:00 via INTRAVENOUS

## 2016-01-15 MED ORDER — POLYETHYLENE GLYCOL 3350 17 G PO PACK
17.0000 g | PACK | Freq: Every day | ORAL | Status: DC | PRN
Start: 2016-01-15 — End: 2016-01-23
  Administered 2016-01-18: 17 g via ORAL
  Filled 2016-01-15: qty 1

## 2016-01-15 MED ORDER — HYDROCODONE-ACETAMINOPHEN 5-325 MG PO TABS: 1.0000 | ORAL_TABLET | ORAL | Status: DC | PRN

## 2016-01-15 MED ORDER — PROMETHAZINE HCL 25 MG/ML IJ SOLN: 6.2500 mg | INTRAMUSCULAR | Status: DC | PRN

## 2016-01-15 MED ORDER — ALUM & MAG HYDROXIDE-SIMETH 200-200-20 MG/5ML PO SUSP: 30.0000 mL | ORAL | Status: DC | PRN

## 2016-01-15 MED ORDER — MIDAZOLAM HCL 5 MG/5ML IJ SOLN
INTRAMUSCULAR | Status: DC | PRN
  Administered 2016-01-15: 2 mg via INTRAVENOUS

## 2016-01-15 MED ORDER — CEFAZOLIN IN D5W 1 GM/50ML IV SOLN: 1.0000 g | Freq: Four times a day (QID) | INTRAVENOUS | Status: DC

## 2016-01-15 MED ORDER — FERROUS SULFATE 325 (65 FE) MG PO TABS
325.0000 mg | ORAL_TABLET | Freq: Three times a day (TID) | ORAL | Status: DC
  Administered 2016-01-18: 325 mg via ORAL
  Filled 2016-01-15 (×15): qty 1

## 2016-01-15 MED ORDER — HYDROMORPHONE HCL 2 MG/ML IJ SOLN
1.0000 mg | INTRAMUSCULAR | Status: DC | PRN
  Administered 2016-01-15 – 2016-01-18 (×27): 1 mg via INTRAVENOUS
  Filled 2016-01-15 (×27): qty 1

## 2016-01-15 MED ORDER — ACETAMINOPHEN 650 MG RE SUPP: 650.0000 mg | Freq: Four times a day (QID) | RECTAL | Status: DC | PRN

## 2016-01-15 MED ORDER — ONDANSETRON HCL 4 MG/2ML IJ SOLN
INTRAMUSCULAR | Status: AC
  Filled 2016-01-15: qty 2

## 2016-01-15 MED ORDER — METOCLOPRAMIDE HCL 5 MG/ML IJ SOLN
5.0000 mg | Freq: Three times a day (TID) | INTRAMUSCULAR | Status: DC | PRN
  Administered 2016-01-16: 10 mg via INTRAVENOUS
  Filled 2016-01-15: qty 2

## 2016-01-15 MED ORDER — LIDOCAINE HCL (CARDIAC) 20 MG/ML IV SOLN
INTRAVENOUS | Status: DC | PRN
  Administered 2016-01-15: 100 mg via INTRAVENOUS

## 2016-01-15 MED ORDER — POLYETHYLENE GLYCOL 3350 17 G PO PACK: 17.0000 g | PACK | Freq: Every day | ORAL | Status: DC | PRN

## 2016-01-15 MED ORDER — FENTANYL CITRATE (PF) 100 MCG/2ML IJ SOLN
INTRAMUSCULAR | Status: DC | PRN
  Administered 2016-01-15: 100 ug via INTRAVENOUS

## 2016-01-15 MED ORDER — OXYCODONE HCL 5 MG PO TABS: 5.0000 mg | ORAL_TABLET | Freq: Once | ORAL | Status: DC | PRN

## 2016-01-15 SURGICAL SUPPLY — 38 items
BANDAGE ACE 4X5 VEL STRL LF (GAUZE/BANDAGES/DRESSINGS) ×3 IMPLANT
BANDAGE ACE 6X5 VEL STRL LF (GAUZE/BANDAGES/DRESSINGS) ×3 IMPLANT
BANDAGE ESMARK 6X9 LF (GAUZE/BANDAGES/DRESSINGS) ×1 IMPLANT
BLADE CUDA 5.5 (BLADE) IMPLANT
BLADE GREAT WHITE 4.2 (BLADE) ×2 IMPLANT
BLADE GREAT WHITE 4.2MM (BLADE) ×1
BLADE SURG ROTATE 9660 (MISCELLANEOUS) IMPLANT
BNDG ESMARK 6X9 LF (GAUZE/BANDAGES/DRESSINGS) ×3
BUR OVAL 6.0 (BURR) IMPLANT
CUFF TOURNIQUET SINGLE 34IN LL (TOURNIQUET CUFF) IMPLANT
DRAPE ARTHROSCOPY W/POUCH 114 (DRAPES) ×3 IMPLANT
DRAPE U-SHAPE 47X51 STRL (DRAPES) ×3 IMPLANT
DRSG OPSITE POSTOP 3X4 (GAUZE/BANDAGES/DRESSINGS) ×3 IMPLANT
DRSG PAD ABDOMINAL 8X10 ST (GAUZE/BANDAGES/DRESSINGS) ×12 IMPLANT
DURAPREP 26ML APPLICATOR (WOUND CARE) ×3 IMPLANT
GAUZE SPONGE 4X4 12PLY STRL (GAUZE/BANDAGES/DRESSINGS) ×3 IMPLANT
GAUZE XEROFORM 1X8 LF (GAUZE/BANDAGES/DRESSINGS) ×3 IMPLANT
GLOVE BIO SURGEON STRL SZ8.5 (GLOVE) ×6 IMPLANT
GLOVE BIOGEL PI IND STRL 8.5 (GLOVE) ×1 IMPLANT
GLOVE BIOGEL PI INDICATOR 8.5 (GLOVE) ×2
GOWN STRL REUS W/ TWL LRG LVL3 (GOWN DISPOSABLE) ×4 IMPLANT
GOWN STRL REUS W/TWL LRG LVL3 (GOWN DISPOSABLE) ×8
KIT BASIN OR (CUSTOM PROCEDURE TRAY) ×3 IMPLANT
KIT ROOM TURNOVER OR (KITS) ×3 IMPLANT
MANIFOLD NEPTUNE II (INSTRUMENTS) ×3 IMPLANT
NEEDLE 18GX1X1/2 (RX/OR ONLY) (NEEDLE) ×3 IMPLANT
PACK ARTHROSCOPY DSU (CUSTOM PROCEDURE TRAY) ×3 IMPLANT
PAD ARMBOARD 7.5X6 YLW CONV (MISCELLANEOUS) ×6 IMPLANT
SET ARTHROSCOPY TUBING (MISCELLANEOUS) ×2
SET ARTHROSCOPY TUBING LN (MISCELLANEOUS) ×1 IMPLANT
SPONGE LAP 4X18 X RAY DECT (DISPOSABLE) ×3 IMPLANT
SUT ETHILON 3 0 PS 1 (SUTURE) ×3 IMPLANT
SYR 20ML ECCENTRIC (SYRINGE) ×3 IMPLANT
TOWEL OR 17X24 6PK STRL BLUE (TOWEL DISPOSABLE) ×3 IMPLANT
TUBE CONNECTING 12'X1/4 (SUCTIONS) ×1
TUBE CONNECTING 12X1/4 (SUCTIONS) ×2 IMPLANT
WAND HAND CNTRL MULTIVAC 90 (MISCELLANEOUS) IMPLANT
WATER STERILE IRR 1000ML POUR (IV SOLUTION) ×3 IMPLANT

## 2016-01-15 NOTE — Anesthesia Procedure Notes (Signed)
Procedure Name: LMA Insertion Date/Time: 01/15/2016 4:14 PM Performed by: Williemae Area B Pre-anesthesia Checklist: Patient identified, Suction available, Emergency Drugs available and Patient being monitored Patient Re-evaluated:Patient Re-evaluated prior to inductionOxygen Delivery Method: Circle system utilized Preoxygenation: Pre-oxygenation with 100% oxygen Intubation Type: IV induction Ventilation: Mask ventilation without difficulty LMA: LMA inserted LMA Size: 4.0 and 5.0 Placement Confirmation: positive ETCO2 and breath sounds checked- equal and bilateral Tube secured with: Tape (taped across cheeks) Dental Injury: Teeth and Oropharynx as per pre-operative assessment

## 2016-01-15 NOTE — Progress Notes (Signed)
Orthopedic Tech Progress Note Patient Details:  TREDARIUS TENEYCK [birth year] [id number]  Applied Overhead Frame with Trapeze Bar (Helper).  Kristopher Oppenheim 01/15/2016, 8:31 PM

## 2016-01-15 NOTE — Anesthesia Preprocedure Evaluation (Addendum)
Anesthesia Evaluation  Patient identified by MRN, date of birth, ID band Patient awake    Reviewed: Allergy & Precautions, H&P , NPO status , Patient's Chart, lab work & pertinent test results, reviewed documented beta blocker date and time   Airway Mallampati: II  TM Distance: >3 FB Neck ROM: full    Dental no notable dental hx. (+) Teeth Intact, Dental Advisory Given   Pulmonary sleep apnea , former smoker,    Pulmonary exam normal breath sounds clear to auscultation       Cardiovascular hypertension, Pt. on medications and Pt. on home beta blockers + Peripheral Vascular Disease  + Valvular Problems/Murmurs  Rhythm:regular Rate:Normal     Neuro/Psych PSYCHIATRIC DISORDERS Anxiety Depression    GI/Hepatic GERD  Medicated and Controlled,(+) Hepatitis -  Endo/Other  Hypothyroidism   Renal/GU Renal diseaseCr. 2     Musculoskeletal   Abdominal   Peds  Hematology  (+) anemia ,   Anesthesia Other Findings Hypertension    Thyroid disease      Hemorrhoids   Hypothyroidism      Tachycardia    Depression      Anxiety    GERD    Arthritis   Hepatitis A     Lower back pain    Sleep apnea  COULD NOT TOLERATE CPAP MASK    Heart murmur     Reproductive/Obstetrics                            Anesthesia Physical  Anesthesia Plan  ASA: II  Anesthesia Plan: General   Post-op Pain Management:    Induction: Intravenous  Airway Management Planned: LMA  Additional Equipment:   Intra-op Plan:   Post-operative Plan: Extubation in OR  Informed Consent: I have reviewed the patients History and Physical, chart, labs and discussed the procedure including the risks, benefits and alternatives for the proposed anesthesia with the patient or authorized representative who has indicated his/her understanding and acceptance.   Dental advisory given  Plan Discussed with: CRNA  Anesthesia Plan  Comments:        Anesthesia Quick Evaluation

## 2016-01-15 NOTE — Progress Notes (Signed)
Family Medicine Teaching Service Daily Progress Note Intern Pager: [phone]  Patient name: Juan Wiggins Medical record number: [id number] Date of birth: 02/15/1951 Age: 65 y.o. Gender: male  Primary Care Provider: Helane Rima, MD Consultants: Orthopedics Code Status: Full  Pt Overview and Major Events to Date:  11/22 Admit to FPTS, joint aspiration, cx sent 11/23 I&D with ortho   Assessment and Plan: Juan Codding Jonesis a 65 y.o.malepresenting with right knee pain presenting with septic joint, readmit, has been off of antibiotics 8 days. PMH is significant for hypothyroidism, alcohol abuse, Hepatitis B, Hepatitis C, HTN, GERD, chronic pain syndrome, hx of TKA (both in 2015), hx of R septic knee (06/2015), major depression, anxiety, BPH.  # Right knee septic joint,  Hx of joint sepsis, recently discharged and hasn't taken antibiotics in 7 days on admission. S/p joint aspiration in ED with cultures sent.1/4 SIRS criteria on admission. Leukocytosis to 12.7, today cbc pending. Patient taken for surgical I&D by ortho today. - Ancef 2g q8h - f/u bcx, ucx, joint aspirate culture - orthopedics on board, will f/u recs after surgery - pain control with morphine 2mg  Q3h PRN - Pt will likely need PT/OT consult after surgery  #history of substance abuse - patient denies alcohol or drug use at this time. History of alcohol use. +cocaine on his three most recent urine drug screens prior to admission, though patient denies use.  UDS positive for benzos and opiates, negative for cocaine this admission - Monitor on CIWA  # AKI Cr 2.36 on admission (baseline 1.16). BMP for today pending. May be prerenal given recent immobility/possible poor hydration.  - IV NS 125 ml/hr - monitor AM Bmet  # RLQ pain. Patient denies urinary urgency or frequency, has had normal BMs, pain is in sacral ligament radiating to groin. No peritoneal signs or fevers, so do not think this is appendicitis. - UA negative for  signs of infection, trace Hgb and +protein - will monitor pain and emesis - pain may radiation of right leg pain given infection  #Hypertension. BP elevated to 146/53 >> 151/61 overnight. Home regimen previously included propanolol which was discontinued at previous admission due to cocaine use.  - monitor BP - can start antihypertensive therapy as needed  #Hepatitis B, chronic and active.At home on Viread 300 mg daily.  - Continue home Viread 300 mg daily - Monitor  #Hepatitis C, chronic and active. At home on Sofosbuvir-Velpatasvir [name]).  - Continue home Epclusa 400-100 mg po tab daily  - Monitor  #Hypothyroidism. Stable. At home on Levothyroxine. Last TSH 0.206 (12/29/2015).  - Continue home Synthroid 200 mcg daily - recheck TSH in 6 weeks  #difficulty sleeping. At home on Ambien 10 mg at bedtime.  - Ambien 5 mg PRN  #Depression . Stable. At home on Lexapro and Remeron. Also on Ambien 10mg  PRN  -Continue Lexapro 20 mg daily at bedtime   #BPH:On Silodosin 8mg  daily at home - flomax 0.4 mg Qd while hospitalized  #Chronic lumbar spine pain - stable. No neurologic deficits. - monitor  # ?Asthma - not indicated on problem list however albuterol on med list. - PRN albuterol  - Will follow up whether pt uses this   FEN/GI: 125 ml/h IVNS, likely adv diet s/p surgical intervention today Ppx: initially SCDs because pt heparin-allergic and going to surgery  Disposition: Admit to med-surg FPTS. Patient will need discharge to SNF with PICC.  Subjective:  No acute events o/n. 6/10 pain this AM in knee, abdominal  pain improved. 1 BM today.  No N/V/D/C o/n.  Objective: Temp:  [98.2 F (36.8 C)-98.3 F (36.8 C)] 98.3 F (36.8 C) (11/22 0536) Pulse Rate:  [44-53] 50 (11/22 0536) Resp:  [18-20] 18 (11/22 0536) BP: (128-151)/(53-84) 151/61 (11/22 0536) SpO2:  [93 %-100 %] 96 % (11/22 0536) Physical Exam: General: NAD, rests comfortably in  bed Cardiovascular: RRR, no m/r/g Respiratory: CTA bil, no W/R/R Abdomen: soft and nontender, nondistended Extremities:+right knee warmth redness and edema, sutures still in place, +streaking down right knee seems mildly improved from yesterday.   2+ peripheral pulses bilaterally  Laboratory:  Recent Labs Lab 01/14/16 1149 01/15/16 0920  WBC 12.7* 11.7*  HGB 12.7* 12.5*  HCT 37.9* 37.5*  PLT 334 354    Recent Labs Lab 01/14/16 1149 01/15/16 0920  NA 134* 137  K 4.7 4.6  CL 103 107  CO2 25 24  BUN 16 14  CREATININE 2.36* 2.06*  CALCIUM 8.4* 8.3*  GLUCOSE 123* 84      Imaging/Diagnostic Tests: Dg Knee 2 Views Right FINDINGS: Mild osseous demineralization. Components of RIGHT knee prosthesis again identified in expected positions. No acute fracture, dislocation, or bone destruction. No periprosthetic lucency identified. Mature well-formed periosteal new bone identified at the distal RIGHT femur, unchanged. Soft tissue swelling diffusely at the distal RIGHT thigh and RIGHT knee extending into the medial proximal RIGHT calf. No definite knee joint effusion  IMPRESSION: RIGHT knee prosthesis without acute complication. Soft tissue swelling RIGHT knee.     Juan Coombe, MD 01/15/2016, 12:24 PM PGY-1, [facility] Intern pager: [phone], text pages welcome

## 2016-01-15 NOTE — H&P (View-Only) (Signed)
Reason for Consult:Swelling of Right Knee and past history of an infected Right TotalKnee. He stated his initial infection was a result of a spider bite. Referring Physician: DR.[name] is an 65 y.o. male.  HPI: He had a Right Total Knee by me two tears ago. Then after a year he had an infected Spider bite on his Forearm tha led to a septicema and renal failure. As a result ,he was admitted to [facility] for three weeks. After DC he developed a septic knee an was washed out and two days later I did a POLY exchange. He did well for a period of time and then had a reoccurence a few weeks ago and as a result had two successive Clean outs. The last one that I did showed a NEGATIVE Culture. He was DC by the Hospitalist on IM Antibiotics that he never did Obtain. He was readmitted this time by Infectious Disease for treatment. Three straight times hias admissions showed Positive for Cocaine.  Past Medical History:  Diagnosis Date  . Anxiety   . Arthritis    RHEUMATOID ARTHRITIS; OA LEFT KNEE  . Cancer (HCC)    MELANOMA REMOVED RT SHOULDER  . Depression   . GERD (gastroesophageal reflux disease)    PREVACID IF NEEDED  . Heart murmur   . Hemorrhoids   . Hepatitis A    PT STATES TYPE OF HEPATITIS YOU GET FROM [place]LLFISH  . Hepatitis B   . Hepatitis C   . Hypertension   . Hypothyroidism   . 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 Pancreatitis, alcoholic, acute   . Septic arthritis ([place]) 01/14/2016   RT KNEE  . Sleep apnea    CLAUSTROPHOBIC - COULD NOT TOLERATE CPAP MASK  . Tachycardia    PT STATES HIS HEART RATE USUALLY 100 OR MORE  . Thyroid disease     Past Surgical History:  Procedure Laterality Date  . colonscopy     . ELBOW SURGERY     BILATERAL ELBOW SURGERY  . I&D EXTREMITY Right 01/02/2016   Procedure: IRRIGATION AND DEBRIDEMENT RIGHT TOTAL KNEE;  Surgeon: Latanya Maudlin, MD;  Location: [place];  Service: Orthopedics;   Laterality: Right;  . I&D KNEE WITH POLY EXCHANGE Right 07/16/2015   Procedure: IRRIGATION AND DEBRIDEMENT RIGHT KNEE WITH POLY EXCHANGE AND PLACEMENT OF ANTIBIOTIC BEADS;  Surgeon: Latanya Maudlin, MD;  Location: [place];  Service: Orthopedics;  Laterality: Right;  . KNEE ARTHROSCOPY Right 07/14/2015   Procedure: ARTHROSCOPY IRRIGATION AND DEBRIDEMENT - KNEE;  Surgeon: Justice Britain, MD;  Location: [place];  Service: Orthopedics;  Laterality: Right;  . KNEE ARTHROSCOPY Right 12/29/2015   Procedure: ARTHROSCOPIC IRRIGATION AND DEBRIEDMENT RIGHT  KNEE;  Surgeon: Melina Schools, MD;  Location: [place];  Service: Orthopedics;  Laterality: Right;  . KNEE SURGERY     BILATERAL KNEE ARTHROSCOPY  . left TKR     July 2015  . LUMBAR LAMINECTOMY/DECOMPRESSION MICRODISCECTOMY Left 06/06/2014   Procedure: LEFT L4-5 DECOMPRESSION ;  Surgeon: Melina Schools, MD;  Location: [place];  Service: Orthopedics;  Laterality: Left;  . REFRACTIVE SURGERY    . SHOULDER SURGERY     RIGHT ROTATOR CUFF REPAIR AND LEFT ARTHROSCOPY  . TOTAL KNEE ARTHROPLASTY Left 08/23/2013   Procedure: LEFT TOTAL KNEE ARTHROPLASTY;  Surgeon: Tobi Bastos, MD;  Location: [facility];  Service: Orthopedics;  Laterality: Left;  . TOTAL KNEE ARTHROPLASTY Right 02/07/2014  Procedure: RIGHT TOTAL KNEE ARTHROPLASTY;  Surgeon: Tobi Bastos, MD;  Location: [facility];  Service: Orthopedics;  Laterality: Right;    Family History  Problem Relation Age of Onset  . Hypertension Mother   . Other Mother     varicose veins  . Hypertension Sister     Social History:  reports that he quit smoking about 17 years ago. His smoking use included Cigars. He has a 52.50 pack-year smoking history. He has never used smokeless tobacco. He reports that he uses drugs, including Cocaine. He reports that he does not drink alcohol.  Allergies:  Allergies  Allergen Reactions  . Heparin Other (See Comments)    Low platelets (130s), no HIT testing performed, platelets recovered.   . Wellbutrin [Bupropion] Other (See Comments)    Hallucinations-started on 180m daily dosage    Medications: I have reviewed the patient's current medications.  Results for orders placed or performed during the hospital encounter of 01/14/16 (from the past 48 hour(s))  CBC with Differential     Status: Abnormal   Collection Time: 01/14/16 11:49 AM  Result Value Ref Range   WBC 12.7 (H) 4.0 - 10.5 K/uL   RBC 3.91 (L) 4.22 - 5.81 MIL/uL   Hemoglobin 12.7 (L) 13.0 - 17.0 g/dL   HCT 37.9 (L) 39.0 - 52.0 %   MCV 96.9 78.0 - 100.0 fL   MCH 32.5 26.0 - 34.0 pg   MCHC 33.5 30.0 - 36.0 g/dL   RDW 14.7 11.5 - 15.5 %   Platelets 334 150 - 400 K/uL   Neutrophils Relative % 73 %   Neutro Abs 9.4 (H) 1.7 - 7.7 K/uL   Lymphocytes Relative 15 %   Lymphs Abs 1.8 0.7 - 4.0 K/uL   Monocytes Relative 9 %   Monocytes Absolute 1.2 (H) 0.1 - 1.0 K/uL   Eosinophils Relative 2 %   Eosinophils Absolute 0.3 0.0 - 0.7 K/uL   Basophils Relative 1 %   Basophils Absolute 0.1 0.0 - 0.1 K/uL  Basic metabolic panel     Status: Abnormal   Collection Time: 01/14/16 11:49 AM  Result Value Ref Range   Sodium 134 (L) 135 - 145 mmol/L   Potassium 4.7 3.5 - 5.1 mmol/L   Chloride 103 101 - 111 mmol/L   CO2 25 22 - 32 mmol/L   Glucose, Bld 123 (H) 65 - 99 mg/dL   BUN 16 6 - 20 mg/dL   Creatinine, Ser 2.36 (H) 0.61 - 1.24 mg/dL   Calcium 8.4 (L) 8.9 - 10.3 mg/dL   GFR calc non Af Amer 28 (L) >60 mL/min   GFR calc Af Amer 32 (L) >60 mL/min    Comment: (NOTE) The eGFR has been calculated using the CKD EPI equation. This calculation has not been validated in all clinical situations. eGFR's persistently <60 mL/min signify possible Chronic Kidney Disease.    Anion gap 6 5 - 15  Sedimentation rate     Status: Abnormal   Collection Time: 01/14/16 11:49 AM  Result Value Ref Range   Sed Rate 55 (H) 0 - 16 mm/hr  C-reactive protein     Status: Abnormal   Collection Time: 01/14/16 11:49 AM  Result Value Ref Range    CRP 9.4 (H) <1.0 mg/dL    Dg Knee 2 Views Right  Result Date: 01/14/2016 CLINICAL DATA:  Staph infection RIGHT knee, swelling, redness, pain EXAM: RIGHT KNEE - 3 VIEW COMPARISON:  07/14/2015 FINDINGS: Mild osseous demineralization. Components of  RIGHT knee prosthesis again identified in expected positions. No acute fracture, dislocation, or bone destruction. No periprosthetic lucency identified. Mature well-formed periosteal new bone identified at the distal RIGHT femur, unchanged. Soft tissue swelling diffusely at the distal RIGHT thigh and RIGHT knee extending into the medial proximal RIGHT calf. No definite knee joint effusion. IMPRESSION: RIGHT knee prosthesis without acute complication. Soft tissue swelling RIGHT knee. Electronically Signed   By: Lavonia Dana M.D.   On: 01/14/2016 12:39    Review of Systems  Constitutional: Negative.   HENT: Negative.   Eyes: Negative.   Respiratory: Negative.   Cardiovascular: Negative.   Gastrointestinal: Negative.   Genitourinary: Negative.   Musculoskeletal: Positive for joint pain.  Skin: Negative.   Neurological: Negative.   Endo/Heme/Allergies: Negative.   Psychiatric/Behavioral: Negative.    Blood pressure (!) 149/71, pulse (!) 50, temperature 98.2 F (36.8 C), temperature source Oral, resp. rate 20, height 5' 11.5" (1.816 m), weight 83.5 kg (184 lb), SpO2 97 %. Physical Exam  Constitutional: He appears well-developed.  HENT:  Head: Normocephalic.  Eyes: Pupils are equal, round, and reactive to light.  Cardiovascular: Normal rate.   Respiratory: Effort normal.  GI: There is tenderness.  Musculoskeletal:  Right Knee effusion.    Assessment/Plan: Right knee effusion,mild and he is afebrile. I aspirated his Right Knee and the fluid was sent for C&S.The fluid was Sero-Sanguiness.I scheduled him for surgical Arthroscopic irrigation tomorrow.  Jiali Linney A 01/14/2016, 6:43 PM

## 2016-01-15 NOTE — Interval H&P Note (Signed)
History and Physical Interval Note:  01/15/2016 4:08 PM  Juan Wiggins  has presented today for surgery, with the diagnosis of Infected Right knee  The various methods of treatment have been discussed with the patient and family. After consideration of risks, benefits and other options for treatment, the patient has consented to  Procedure(s): ARTHROSCOPIC WASHOUT (Right) as a surgical intervention .  The patient's history has been reviewed, patient examined, no change in status, stable for surgery.  I have reviewed the patient's chart and labs.  Questions were answered to the patient's satisfaction.     Abhishek Levesque A

## 2016-01-15 NOTE — Progress Notes (Signed)
    Juan Wiggins    Juan Wiggins, well known to the ID team, is a 65yo M with HCV-HBV co infection, recently finished hep C treatment but had prosthetic joint infection of R TKA s/p wash out on 11/5. He declined to go to SNF at that time and was discharged on 11/13 to get daily infusion of ceftriaxone which did not occur. In the meantime, he reports increasing pain and swelling to his knee. Due to high risk of non-compliance, I asked him to get re-admitted, evaluate if needed for aspirate or repeat wash out. He was taken to the OR today by dr Gladstone Lighter  Recommend to follow up on cultures. Keep on cefazolin 2gm IV Q 8hr with plan to treat for 6 wks. Strongly encourage he goes to SNF. Do not recommend home with PICC line due to drug use.  Dr Tommy Medal to provide further recs tomorrow   Baxter Flattery [facility] Cell: [phone] Pager: [phone]  01/15/2016, 3:46 PM

## 2016-01-15 NOTE — Transfer of Care (Signed)
Immediate Anesthesia Transfer of Care Note  Patient: ASER BURZYNSKI  Procedure(s) Performed: Procedure(s): ARTHROSCOPIC WASHOUT (Right)  Patient Location: PACU  Anesthesia Type:General  Level of Consciousness: awake, oriented and patient cooperative  Airway & Oxygen Therapy: Patient Spontanous Breathing and Patient connected to nasal cannula oxygen  Post-op Assessment: Report given to RN, Post -op Vital signs reviewed and stable and Patient moving all extremities  Post vital signs: Reviewed and stable  Last Vitals:  Vitals:   01/15/16 0536 01/15/16 1338  BP: (!) 151/61 (!) 187/83  Pulse: (!) 50 (!) 50  Resp: 18 18  Temp: 36.8 C 37 C    Last Pain:  Vitals:   01/15/16 1338  TempSrc: Oral  PainSc:       Patients Stated Pain Goal: 5 ([phone] [id number])  Complications: No apparent anesthesia complications

## 2016-01-15 NOTE — Brief Op Note (Signed)
01/14/2016 - 01/15/2016  4:54 PM  PATIENT:  Juan Wiggins  65 y.o. male  PRE-OPERATIVE DIAGNOSIS:  Re-Infection of Right Total Knee.  POST-OPERATIVE DIAGNOSIS:  Questionable Re-Infection Right Total Knee  PROCEDURE:  Procedure(s): ARTHROSCOPIC WASHOUT (Right)  SURGEON:  Surgeon(s) and Role:    * Latanya Maudlin, MD - Primary  :   Assistant: OR Nurse ANESTHESIA:   general  EBL:  Total I/O In: 1762.5 [I.V.:1762.5] Out: 10 [Blood:10]  BLOOD ADMINISTERED:none  DRAINS: none   LOCAL MEDICATIONS USED:  MARCAINE 20cc of 0.25% with Epinephreine     SPECIMEN:  Source of Specimen:  Synovial Fluid Right Knee  DISPOSITION OF SPECIMEN:  PATHOLOGY  COUNTS:  YES  TOURNIQUET:    DICTATION: .Other Dictation: Dictation Number [id number]  PLAN OF CARE: Other Dictation: Dictation Number [id number]  PATIENT DISPOSITION:  PACU - hemodynamically stable.   Delay start of Pharmacological VTE agent (>24hrs) due to surgical blood loss or risk of bleeding: yes

## 2016-01-16 DIAGNOSIS — T8453XD Infection and inflammatory reaction due to internal right knee prosthesis, subsequent encounter: Secondary | ICD-10-CM

## 2016-01-16 DIAGNOSIS — Y792 Prosthetic and other implants, materials and accessory orthopedic devices associated with adverse incidents: Secondary | ICD-10-CM

## 2016-01-16 DIAGNOSIS — N289 Disorder of kidney and ureter, unspecified: Secondary | ICD-10-CM

## 2016-01-16 DIAGNOSIS — B9561 Methicillin susceptible Staphylococcus aureus infection as the cause of diseases classified elsewhere: Secondary | ICD-10-CM

## 2016-01-16 DIAGNOSIS — T8459XS Infection and inflammatory reaction due to other internal joint prosthesis, sequela: Secondary | ICD-10-CM

## 2016-01-16 DIAGNOSIS — F1990 Other psychoactive substance use, unspecified, uncomplicated: Secondary | ICD-10-CM

## 2016-01-16 LAB — CBC
HCT: 36 % — ABNORMAL LOW (ref 39.0–52.0)
Hemoglobin: 12.1 g/dL — ABNORMAL LOW (ref 13.0–17.0)
MCH: 32.7 pg (ref 26.0–34.0)
MCHC: 33.6 g/dL (ref 30.0–36.0)
MCV: 97.3 fL (ref 78.0–100.0)
PLATELETS: 345 10*3/uL (ref 150–400)
RBC: 3.7 MIL/uL — AB (ref 4.22–5.81)
RDW: 14.6 % (ref 11.5–15.5)
WBC: 9.1 10*3/uL (ref 4.0–10.5)

## 2016-01-16 LAB — BASIC METABOLIC PANEL
ANION GAP: 6 (ref 5–15)
BUN: 14 mg/dL (ref 6–20)
CALCIUM: 8.3 mg/dL — AB (ref 8.9–10.3)
CO2: 24 mmol/L (ref 22–32)
Chloride: 108 mmol/L (ref 101–111)
Creatinine, Ser: 2.14 mg/dL — ABNORMAL HIGH (ref 0.61–1.24)
GFR calc Af Amer: 36 mL/min — ABNORMAL LOW (ref >60)
GFR, EST NON AFRICAN AMERICAN: 31 mL/min — AB (ref >60)
GLUCOSE: 84 mg/dL (ref 65–99)
Potassium: 4.5 mmol/L (ref 3.5–5.1)
SODIUM: 138 mmol/L (ref 135–145)

## 2016-01-16 MED ORDER — AMLODIPINE BESYLATE 5 MG PO TABS
5.0000 mg | ORAL_TABLET | Freq: Every day | ORAL | Status: DC
  Administered 2016-01-16: 5 mg via ORAL
  Filled 2016-01-16: qty 1

## 2016-01-16 MED ORDER — CEFAZOLIN SODIUM-DEXTROSE 2-4 GM/100ML-% IV SOLN
2.0000 g | Freq: Three times a day (TID) | INTRAVENOUS | Status: DC
  Administered 2016-01-16 – 2016-01-19 (×8): 2 g via INTRAVENOUS
  Filled 2016-01-16 (×11): qty 100

## 2016-01-16 MED ORDER — HYDRALAZINE HCL 20 MG/ML IJ SOLN: 5.0000 mg | INTRAMUSCULAR | Status: DC | PRN

## 2016-01-16 MED ORDER — PROPRANOLOL HCL ER 60 MG PO CP24
60.0000 mg | ORAL_CAPSULE | ORAL | Status: DC
  Filled 2016-01-16: qty 1

## 2016-01-16 NOTE — Consult Note (Signed)
Date of Admission:  01/14/2016  Date of Consult:  01/16/2016  Reason for Consult: Recurrent prosthetic joint septic arthritis most recent was staph coccus aureus Referring Physician: Dr. Gladstone Lighter   HPI: Juan Wiggins is an 65 y.o. 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  He was taken the operating room   with swelling and taken to OR yesterday for with irrigation and debridement now methicillin sensitive Staphylococcus aureus isolated.    Dr. Linus Salmons had wished for him to be sent to a skilled nursing facility for IV antibiotics because there was a high suspicion for IV drug use due to the following factors:  #1 he has known chronic hepatitis C that he is being treated for in our clinic  #2 he has newly acquired hepatitis B infection that he did not have one hepatitis C therapy was begun  #3 has had cocaine in his urine tox screens on multiplications  #4 he has no reliable explanation for why he has the above viral infections  A she refused to be placed in a skilled nursing facility and instead arrangements were for him to go home if I understand this correctly with intramuscular ceftriaxone. Patient claims that this particular type of ceftriaxone was not made by the pharmacy which is not true but regardless that is what he said. He never got the antibiotics that he was supposed to be discharged on intramuscularly when we found out our pharmacist [facility] Dr. Baxter Flattery called him and exhorted him to come in to the hospital to the emerge department for evaluation of his knee which began to swell yet again.  He has yet again had I&D of his knee by Dr. Gladstone Lighter and cultures are growing Staphylococcus  aureus.  I thought he had had an new exchange arthroplasty but I do not see that clearly from the notes. Regardless have a high anxiety that he will fail therapy absent removal of his prosthesis followed by prolonged parenteral therapy.  He once again does not want to go to a skilled nursing facility receive IV antibiotics and once again we are not going to going to send him home with IV antibiotics there are some alternative options we can present below  Note he is currently being treated for both his chronic hepatitis C and his hepatitis B but he has a bump in his serum creatinine which is required dose adjusting of his Viread.    Past Medical History:  Diagnosis Date  . Anxiety   . Arthritis    RHEUMATOID ARTHRITIS; OA LEFT KNEE  . Cancer (HCC)    MELANOMA REMOVED RT SHOULDER  . Depression   . GERD (gastroesophageal reflux disease)    PREVACID IF NEEDED  . Heart murmur   . Hemorrhoids   . Hepatitis A    PT STATES TYPE OF HEPATITIS YOU GET FROM SHELLFISH  . Hepatitis B   . Hepatitis C   . Hypertension   . Hypothyroidism   . 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 Pancreatitis, alcoholic, acute   . Septic arthritis ([place]) 01/14/2016   RT  KNEE  . Sleep apnea    CLAUSTROPHOBIC - COULD NOT TOLERATE CPAP MASK  . Tachycardia    PT STATES HIS HEART RATE USUALLY 100 OR MORE  . Thyroid disease     Past Surgical History:  Procedure Laterality Date  . colonscopy     . ELBOW SURGERY     BILATERAL ELBOW SURGERY  . I&D EXTREMITY Right 01/02/2016   Procedure: IRRIGATION AND DEBRIDEMENT RIGHT TOTAL KNEE;  Surgeon: Latanya Maudlin, MD;  Location: [place];  Service: Orthopedics;  Laterality: Right;  . I&D KNEE WITH POLY EXCHANGE Right 07/16/2015   Procedure: IRRIGATION AND DEBRIDEMENT RIGHT KNEE WITH POLY EXCHANGE AND PLACEMENT OF ANTIBIOTIC BEADS;  Surgeon: Latanya Maudlin, MD;  Location: [place];  Service: Orthopedics;  Laterality: Right;  . KNEE  ARTHROSCOPY Right 07/14/2015   Procedure: ARTHROSCOPY IRRIGATION AND DEBRIDEMENT - KNEE;  Surgeon: Justice Britain, MD;  Location: [place];  Service: Orthopedics;  Laterality: Right;  . KNEE ARTHROSCOPY Right 12/29/2015   Procedure: ARTHROSCOPIC IRRIGATION AND DEBRIEDMENT RIGHT  KNEE;  Surgeon: Melina Schools, MD;  Location: [place];  Service: Orthopedics;  Laterality: Right;  . KNEE SURGERY     BILATERAL KNEE ARTHROSCOPY  . left TKR     July 2015  . LUMBAR LAMINECTOMY/DECOMPRESSION MICRODISCECTOMY Left 06/06/2014   Procedure: LEFT L4-5 DECOMPRESSION ;  Surgeon: Melina Schools, MD;  Location: [place];  Service: Orthopedics;  Laterality: Left;  . REFRACTIVE SURGERY    . SHOULDER SURGERY     RIGHT ROTATOR CUFF REPAIR AND LEFT ARTHROSCOPY  . TOTAL KNEE ARTHROPLASTY Left 08/23/2013   Procedure: LEFT TOTAL KNEE ARTHROPLASTY;  Surgeon: Tobi Bastos, MD;  Location: [facility];  Service: Orthopedics;  Laterality: Left;  . TOTAL KNEE ARTHROPLASTY Right 02/07/2014   Procedure: RIGHT TOTAL KNEE ARTHROPLASTY;  Surgeon: Tobi Bastos, MD;  Location: [facility];  Service: Orthopedics;  Laterality: Right;    Social History:  reports that he quit smoking about 17 years ago. His smoking use included Cigars. He has a 52.50 pack-year smoking history. He has never used smokeless tobacco. He reports that he uses drugs, including Cocaine. He reports that he does not drink alcohol.   Family History  Problem Relation Age of Onset  . Hypertension Mother   . Other Mother     varicose veins  . Hypertension Sister     Allergies  Allergen Reactions  . Heparin Other (See Comments)    Low platelets (130s), no HIT testing performed, platelets recovered.  . Wellbutrin [Bupropion] Other (See Comments)    Hallucinations-started on 175m daily dosage     Medications: I have reviewed patients current medications as documented in Epic Anti-infectives    Start     Dose/Rate Route Frequency Ordered Stop   01/17/16 0900  tenofovir  (VIREAD) tablet 300 mg     300 mg Oral Every 48 hours 01/15/16 1330     01/16/16 1700  ceFAZolin (ANCEF) IVPB 2g/100 mL premix     2 g 200 mL/hr over 30 Minutes Intravenous Every 8 hours 01/16/16 1433     01/15/16 1930  ceFAZolin (ANCEF) IVPB 1 g/50 mL premix  Status:  Discontinued     1 g 100 mL/hr over 30 Minutes Intravenous Every 6 hours 01/15/16 1923 01/15/16 2042   01/15/16 1930  ceFAZolin (ANCEF) IVPB 1 g/50 mL premix  Status:  Discontinued     1 g 100 mL/hr over 30 Minutes Intravenous Every 6 hours 01/15/16 1923 01/15/16 1932  01/15/16 1800  ceFAZolin (ANCEF) IVPB 2g/100 mL premix  Status:  Discontinued     2 g 200 mL/hr over 30 Minutes Intravenous Every 8 hours 01/15/16 1344 01/16/16 1412   01/15/16 1000  tenofovir (VIREAD) tablet 300 mg  Status:  Discontinued     300 mg Oral Daily 01/14/16 1539 01/15/16 1330   01/14/16 2200  ceFAZolin (ANCEF) IVPB 2g/100 mL premix  Status:  Discontinued     2 g 200 mL/hr over 30 Minutes Intravenous Every 12 hours 01/14/16 1546 01/15/16 1344   01/14/16 1545  Sofosbuvir-Velpatasvir 400-100 MG TABS 1 tablet     1 tablet Oral Daily 01/14/16 1539     01/14/16 1400  ceFAZolin (ANCEF) IVPB 2g/100 mL premix  Status:  Discontinued     2 g 200 mL/hr over 30 Minutes Intravenous Every 8 hours 01/14/16 1154 01/14/16 1546         ROS:  as in HPI otherwise remainder of 12 point Review of Systems is negative Blood pressure (!) 142/73, pulse 60, temperature 97.3 F (36.3 C), temperature source Oral, resp. rate (!) 21, height 5' 11.5" (1.816 m), weight 184 lb (83.5 kg), SpO2 96 %. General: Alert and awake, oriented x3, not in any acute distress. HEENT: anicteric sclera,  EOMI, oropharynx clear and without exudate Cardiovascular: egular rate, normal r,  no murmur rubs or gallops Pulmonary: clear to auscultation bilaterally, no wheezing, rales or rhonchi Gastrointestinal: soft nontender, nondistended, normal bowel sounds, Musculoskeletal: knee wrapped in  bandage Skin, soft tissue: no rashes Neuro: nonfocal, strength and sensation intact   Results for orders placed or performed during the hospital encounter of 01/14/16 (from the past 48 hour(s))  Urinalysis, Routine w reflex microscopic (not at [facility])     Status: Abnormal   Collection Time: 01/14/16  3:14 PM  Result Value Ref Range   Color, Urine AMBER (A) YELLOW    Comment: BIOCHEMICALS MAY BE AFFECTED BY COLOR   APPearance CLEAR CLEAR   Specific Gravity, Urine 1.012 1.005 - 1.030   pH 7.0 5.0 - 8.0   Glucose, UA NEGATIVE NEGATIVE mg/dL   Hgb urine dipstick TRACE (A) NEGATIVE   Bilirubin Urine NEGATIVE NEGATIVE   Ketones, ur NEGATIVE NEGATIVE mg/dL   Protein, ur 100 (A) NEGATIVE mg/dL   Nitrite NEGATIVE NEGATIVE   Leukocytes, UA NEGATIVE NEGATIVE  Urine microscopic-add on     Status: Abnormal   Collection Time: 01/14/16  3:14 PM  Result Value Ref Range   Squamous Epithelial / LPF 0-5 (A) NONE SEEN   WBC, UA 0-5 0 - 5 WBC/hpf   RBC / HPF 0-5 0 - 5 RBC/hpf   Bacteria, UA FEW (A) NONE SEEN   Casts HYALINE CASTS (A) NEGATIVE    Comment: GRANULAR CAST  Aerobic/Anaerobic Culture (surgical/deep wound)     Status: None (Preliminary result)   Collection Time: 01/14/16  6:44 PM  Result Value Ref Range   Specimen Description FLUID SYNOVIAL RIGHT KNEE    Special Requests NONE    Gram Stain      MODERATE WBC PRESENT, PREDOMINANTLY PMN NO ORGANISMS SEEN    Culture      FEW STAPHYLOCOCCUS AUREUS CRITICAL RESULT CALLED TO, READ BACK BY AND VERIFIED WITH: CEvette Doffing RN, AT 1202 01/16/16 BY D. VANHOOK REGARDING GRAM STAIN    Report Status PENDING   Rapid urine drug screen (hospital performed)     Status: Abnormal   Collection Time: 01/14/16  9:07 PM  Result Value Ref Range  Opiates POSITIVE (A) NONE DETECTED   Cocaine NONE DETECTED NONE DETECTED   Benzodiazepines POSITIVE (A) NONE DETECTED   Amphetamines NONE DETECTED NONE DETECTED   Tetrahydrocannabinol NONE DETECTED NONE DETECTED     Barbiturates NONE DETECTED NONE DETECTED    Comment:        DRUG SCREEN FOR MEDICAL PURPOSES ONLY.  IF CONFIRMATION IS NEEDED FOR ANY PURPOSE, NOTIFY LAB WITHIN 5 DAYS.        LOWEST DETECTABLE LIMITS FOR URINE DRUG SCREEN Drug Class       Cutoff (ng/mL) Amphetamine      1000 Barbiturate      200 Benzodiazepine   024 Tricyclics       097 Opiates          300 Cocaine          300 THC              50   Surgical pcr screen     Status: None   Collection Time: 01/15/16 12:09 AM  Result Value Ref Range   MRSA, PCR NEGATIVE NEGATIVE   Staphylococcus aureus NEGATIVE NEGATIVE    Comment:        The Xpert SA Assay (FDA approved for NASAL specimens in patients over 83 years of age), is one component of a comprehensive surveillance program.  Test performance has been validated by [facility] for patients greater than or equal to 70 year old. It is not intended to diagnose infection nor to guide or monitor treatment.   Basic metabolic panel     Status: Abnormal   Collection Time: 01/15/16  9:20 AM  Result Value Ref Range   Sodium 137 135 - 145 mmol/L   Potassium 4.6 3.5 - 5.1 mmol/L   Chloride 107 101 - 111 mmol/L   CO2 24 22 - 32 mmol/L   Glucose, Bld 84 65 - 99 mg/dL   BUN 14 6 - 20 mg/dL   Creatinine, Ser 2.06 (H) 0.61 - 1.24 mg/dL   Calcium 8.3 (L) 8.9 - 10.3 mg/dL   GFR calc non Af Amer 33 (L) >60 mL/min   GFR calc Af Amer 38 (L) >60 mL/min    Comment: (NOTE) The eGFR has been calculated using the CKD EPI equation. This calculation has not been validated in all clinical situations. eGFR's persistently <60 mL/min signify possible Chronic Kidney Disease.    Anion gap 6 5 - 15  CBC     Status: Abnormal   Collection Time: 01/15/16  9:20 AM  Result Value Ref Range   WBC 11.7 (H) 4.0 - 10.5 K/uL   RBC 3.83 (L) 4.22 - 5.81 MIL/uL   Hemoglobin 12.5 (L) 13.0 - 17.0 g/dL   HCT 37.5 (L) 39.0 - 52.0 %   MCV 97.9 78.0 - 100.0 fL   MCH 32.6 26.0 - 34.0 pg   MCHC 33.3  30.0 - 36.0 g/dL   RDW 14.7 11.5 - 15.5 %   Platelets 354 150 - 400 K/uL  Body fluid culture     Status: None (Preliminary result)   Collection Time: 01/15/16  4:37 PM  Result Value Ref Range   Specimen Description SYNOVIAL RIGHT KNEE    Special Requests FLUID ON SWAB PATIENT ON FOLLOWING ANCEF    Gram Stain      ABUNDANT WBC PRESENT, PREDOMINANTLY PMN NO ORGANISMS SEEN    Culture TOO YOUNG TO READ    Report Status PENDING   CBC     Status: Abnormal  Collection Time: 01/16/16  5:03 AM  Result Value Ref Range   WBC 9.1 4.0 - 10.5 K/uL   RBC 3.70 (L) 4.22 - 5.81 MIL/uL   Hemoglobin 12.1 (L) 13.0 - 17.0 g/dL   HCT 36.0 (L) 39.0 - 52.0 %   MCV 97.3 78.0 - 100.0 fL   MCH 32.7 26.0 - 34.0 pg   MCHC 33.6 30.0 - 36.0 g/dL   RDW 14.6 11.5 - 15.5 %   Platelets 345 150 - 400 K/uL  Basic metabolic panel     Status: Abnormal   Collection Time: 01/16/16  5:03 AM  Result Value Ref Range   Sodium 138 135 - 145 mmol/L   Potassium 4.5 3.5 - 5.1 mmol/L   Chloride 108 101 - 111 mmol/L   CO2 24 22 - 32 mmol/L   Glucose, Bld 84 65 - 99 mg/dL   BUN 14 6 - 20 mg/dL   Creatinine, Ser 2.14 (H) 0.61 - 1.24 mg/dL   Calcium 8.3 (L) 8.9 - 10.3 mg/dL   GFR calc non Af Amer 31 (L) >60 mL/min   GFR calc Af Amer 36 (L) >60 mL/min    Comment: (NOTE) The eGFR has been calculated using the CKD EPI equation. This calculation has not been validated in all clinical situations. eGFR's persistently <60 mL/min signify possible Chronic Kidney Disease.    Anion gap 6 5 - 15   _0 (sdes,specrequest,cult,reptstatus)   ) Recent Results (from the past 720 hour(s))  Blood culture (routine x 2)     Status: None   Collection Time: 12/29/15 11:30 AM  Result Value Ref Range Status   Specimen Description BLOOD LEFT ARM  Final   Special Requests BOTTLES DRAWN AEROBIC AND ANAEROBIC 5CC  Final   Culture NO GROWTH 5 DAYS  Final   Report Status 01/03/2016 FINAL  Final  Blood culture (routine x 2)      Status: None   Collection Time: 12/29/15 11:36 AM  Result Value Ref Range Status   Specimen Description BLOOD RIGHT ARM  Final   Special Requests BOTTLES DRAWN AEROBIC AND ANAEROBIC 5CC  Final   Culture NO GROWTH 5 DAYS  Final   Report Status 01/03/2016 FINAL  Final  Body fluid culture     Status: None   Collection Time: 12/29/15 12:30 PM  Result Value Ref Range Status   Specimen Description SYNOVIAL RIGHT KNEE  Final   Special Requests NONE  Final   Gram Stain   Final    ABUNDANT WBC PRESENT, PREDOMINANTLY PMN RARE GRAM POSITIVE COCCI IN CLUSTERS    Culture FEW STAPHYLOCOCCUS AUREUS  Final   Report Status 12/31/2015 FINAL  Final   Organism ID, Bacteria STAPHYLOCOCCUS AUREUS  Final      Susceptibility   Staphylococcus aureus - MIC*    CIPROFLOXACIN <=0.5 SENSITIVE Sensitive     ERYTHROMYCIN <=0.25 SENSITIVE Sensitive     GENTAMICIN <=0.5 SENSITIVE Sensitive     OXACILLIN 0.5 SENSITIVE Sensitive     TETRACYCLINE <=1 SENSITIVE Sensitive     VANCOMYCIN <=0.5 SENSITIVE Sensitive     TRIMETH/SULFA <=10 SENSITIVE Sensitive     CLINDAMYCIN <=0.25 SENSITIVE Sensitive     RIFAMPIN <=0.5 SENSITIVE Sensitive     Inducible Clindamycin NEGATIVE Sensitive     * FEW STAPHYLOCOCCUS AUREUS  Urine culture     Status: Abnormal   Collection Time: 12/29/15  1:15 PM  Result Value Ref Range Status   Specimen Description URINE, RANDOM  Final   Special  Requests NONE  Final   Culture <10,000 COLONIES/mL INSIGNIFICANT GROWTH (A)  Final   Report Status 12/30/2015 FINAL  Final  MRSA PCR Screening     Status: None   Collection Time: 12/29/15  4:00 PM  Result Value Ref Range Status   MRSA by PCR NEGATIVE NEGATIVE Final    Comment:        The GeneXpert MRSA Assay (FDA approved for NASAL specimens only), is one component of a comprehensive MRSA colonization surveillance program. It is not intended to diagnose MRSA infection nor to guide or monitor treatment for MRSA infections.    Aerobic/Anaerobic Culture (surgical/deep wound)     Status: None   Collection Time: 01/02/16  2:05 PM  Result Value Ref Range Status   Specimen Description FLUID RIGHT KNEE  Final   Special Requests SWAB  Final   Gram Stain   Final    FEW WBC PRESENT, PREDOMINANTLY PMN NO ORGANISMS SEEN    Culture No growth aerobically or anaerobically.  Final   Report Status 01/07/2016 FINAL  Final  Blood culture (routine x 2)     Status: None (Preliminary result)   Collection Time: 01/14/16 11:53 AM  Result Value Ref Range Status   Specimen Description BLOOD LEFT ARM  Final   Special Requests BOTTLES DRAWN AEROBIC AND ANAEROBIC 5CC  Final   Culture NO GROWTH 2 DAYS  Final   Report Status PENDING  Incomplete  Blood culture (routine x 2)     Status: None (Preliminary result)   Collection Time: 01/14/16 12:15 PM  Result Value Ref Range Status   Specimen Description BLOOD RIGHT ARM  Final   Special Requests BOTTLES DRAWN AEROBIC AND ANAEROBIC 5CC  Final   Culture NO GROWTH 2 DAYS  Final   Report Status PENDING  Incomplete  Aerobic/Anaerobic Culture (surgical/deep wound)     Status: None (Preliminary result)   Collection Time: 01/14/16  6:44 PM  Result Value Ref Range Status   Specimen Description FLUID SYNOVIAL RIGHT KNEE  Final   Special Requests NONE  Final   Gram Stain   Final    MODERATE WBC PRESENT, PREDOMINANTLY PMN NO ORGANISMS SEEN    Culture   Final    FEW STAPHYLOCOCCUS AUREUS CRITICAL RESULT CALLED TO, READ BACK BY AND VERIFIED WITH: C. HANCOCK RN, AT 1202 01/16/16 BY D. VANHOOK REGARDING GRAM STAIN    Report Status PENDING  Incomplete  Surgical pcr screen     Status: None   Collection Time: 01/15/16 12:09 AM  Result Value Ref Range Status   MRSA, PCR NEGATIVE NEGATIVE Final   Staphylococcus aureus NEGATIVE NEGATIVE Final    Comment:        The Xpert SA Assay (FDA approved for NASAL specimens in patients over 42 years of age), is one component of a comprehensive  surveillance program.  Test performance has been validated by [facility] for patients greater than or equal to 61 year old. It is not intended to diagnose infection nor to guide or monitor treatment.   Body fluid culture     Status: None (Preliminary result)   Collection Time: 01/15/16  4:37 PM  Result Value Ref Range Status   Specimen Description SYNOVIAL RIGHT KNEE  Final   Special Requests FLUID ON SWAB PATIENT ON FOLLOWING ANCEF  Final   Gram Stain   Final    ABUNDANT WBC PRESENT, PREDOMINANTLY PMN NO ORGANISMS SEEN    Culture TOO YOUNG TO READ  Final   Report  Status PENDING  Incomplete     Impression/Recommendation  Active Problems:   Infection of total knee replacement (HCC)   Septic arthritis ([place])   Staphylococcal arthritis of right knee ([place])   NIKOLOZ HUY is a 65 y.o. 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 He has a history is highly suspicious for intravenous drug use with both hepatitis B and hepatitis C infections and tox screens positive for cocaine. He apparently did not receive parenteral therapy in the form of intramuscular or highly bioavailable therapy when he left the hospital most recently and was unwilling to go into a skilled nursing facility he is come back and had repeat I&D with now Staphylococcus aureus growing yet again.  #1 Staphylococcus aureus prosthetic joint infection:  This is a very difficult case.  I would advocate for removal of his prosthesis and placement of antibiotic spacer followed by parenteral therapy.  The patient himself is not willing to have such parenteral therapy given to him in a skilled nursing facility and I'm not willing to send him home with a PICC line. I'll need to touch base with orthopedic surgery with regards to the surgical options. I spoke with Dr. Gladstone Lighter today and contact Dr. Rolena Infante who is covering.  With regards to  parenteral therapy one option could be to give him a long-acting antibiotic IV ORITAVANCIN she'll stay in his system for 2 weeks at least followed by another long acting DALBAVANCIN, followed by PO vs using bioavailable PO option such as zyvox  Would prefer one of these latter options.  Before proceeding with this though to get clarity about whether he might go back to the operating room adding room for removal of his prosthesis.  Now would continue with cefazolin. Yes it is possible he might have MRSA but if he does a don't want to be giving him vancomycin his current predicament  #2 chronic hepatitis B with section having been acquired within the past year again pointing to ongoing IV drug use: He is on Viread and dosed renally with every other day day dosing. If we could acquireit Vemlady -TAF would be safer vs could give him Descovy (FDA approved for HIV but contains TAF which is not nephrotoxic) will discuss with pharmayc  #3 hepatitis C without hepatic,: We'll continue his [name] (he brought home meds)  #4 suspected IVDU: He denies this but I'm very confident that he is an active IV drug user.  #5 acute renal insufficiency: Likely due to his infectious process careful with drugs that could be nephrotoxic as above  I spent greater than 80 minutes with the patient including greater than 50% of time in face to face counsel of the patient guarding his prosthetic joint infection, hepatitis B C in coordination of his  Care with pharmacy orthopedics.  01/16/2016, 2:37 PM   Thank you so much for this interesting consult  [place] for [place] [phone] (pager) [phone] (office) 01/16/2016, 2:37 PM  Rhina [place] 01/16/2016, 2:37 PM

## 2016-01-16 NOTE — Progress Notes (Signed)
    Subjective: 1 Day Post-Op Procedure(s) (LRB): ARTHROSCOPIC WASHOUT (Right) Patient reports pain as 3 on 0-10 scale.   Denies CP or SOB.  Voiding without difficulty. Positive flatus. Objective: Vital signs in last 24 hours: Temp:  [97 F (36.1 C)-98.6 F (37 C)] 97.8 F (36.6 C) (11/23 0514) Pulse Rate:  [44-57] 48 (11/23 0514) Resp:  [13-20] 20 (11/23 0514) BP: (157-187)/(71-93) 175/77 (11/23 0514) SpO2:  [90 %-100 %] 98 % (11/23 0514)  Intake/Output from previous day: 11/22 0701 - 11/23 0700 In: 1812.5 [I.V.:1812.5] Out: 10 [Blood:10] Intake/Output this shift: No intake/output data recorded.  Labs:  Recent Labs  01/14/16 1149 01/15/16 0920 01/16/16 0503  HGB 12.7* 12.5* 12.1*    Recent Labs  01/15/16 0920 01/16/16 0503  WBC 11.7* 9.1  RBC 3.83* 3.70*  HCT 37.5* 36.0*  PLT 354 345    Recent Labs  01/15/16 0920 01/16/16 0503  NA 137 138  K 4.6 4.5  CL 107 108  CO2 24 24  BUN 14 14  CREATININE 2.06* 2.14*  GLUCOSE 84 84  CALCIUM 8.3* 8.3*   No results for input(s): LABPT, INR in the last 72 hours.  Physical Exam: Neurologically intact ABD soft Intact pulses distally Dorsiflexion/Plantar flexion intact Incision: dressing C/D/I Compartment soft  Assessment/Plan: 1 Day Post-Op Procedure(s) (LRB): ARTHROSCOPIC WASHOUT (Right) Advance diet Up with therapy  Juan Wiggins D for Dr. Melina Schools [facility] [phone] 01/16/2016, 9:06 AM

## 2016-01-16 NOTE — Progress Notes (Signed)
Pharmacy Antibiotic Note  Juan Wiggins is a 65 y.o. male admitted on 01/14/2016 with septic arthritis of the right knee. Patient was recently discharge on 11/13 and when 7 days without antibiotics due to not being able to afford the medication. Joint was previously growing MSSA.   Pharmacy has been consulted for cefazolin dosing. Patient is afebrile and WBC is down to 9.1. Renal function has fluctuated with the most recent Scr of 2.14. Estimated CrCl ~ 35 ml/min.   Plan: Cefazolin 2 gm every 8 hours  Monitor s/sx of infection and plan for length of therapy Watch renal function closely and dose adjust as necessary.   Height: 5' 11.5" (181.6 cm) Weight: 184 lb (83.5 kg) IBW/kg (Calculated) : 76.45  Temp (24hrs), Avg:97.4 F (36.3 C), Min:97 F (36.1 C), Max:97.8 F (36.6 C)   Recent Labs Lab 01/14/16 1149 01/15/16 0920 01/16/16 0503  WBC 12.7* 11.7* 9.1  CREATININE 2.36* 2.06* 2.14*    Estimated Creatinine Clearance: 38.2 mL/min (by C-G formula based on SCr of 2.14 mg/dL (H)).    Allergies  Allergen Reactions  . Heparin Other (See Comments)    Low platelets (130s), no HIT testing performed, platelets recovered.  . Wellbutrin [Bupropion] Other (See Comments)    Hallucinations-started on 150mg  daily dosage    Antimicrobials this admission: Cefazolin 11/21>>   Microbiology results: 11/5 MRSA PCR: Neg 11/5 right knee synovial fluid cx: Few MSSA 11/21 BCx: NGTD  11/21 right knee synovial fluid: Few Staph aureus   Thank you for allowing pharmacy to be a part of this patient's care.  Ihor Austin, PharmD PGY1 Pharmacy Resident Pager: [phone] 01/16/2016 2:34 PM

## 2016-01-16 NOTE — Progress Notes (Signed)
Family Medicine Teaching Service Daily Progress Note Intern Pager: [phone]  Patient name: Juan Wiggins Medical record number: [id number] Date of birth: February 21, 1951 Age: 65 y.o. Gender: male  Primary Care Provider: Helane Rima, MD Consultants:  Ortho, ID Code Status: Full  Pt Overview and Major Events to Date:  11/22 - Admitted for R septic knee (re-admission)  Assessment and Plan: Prahlad Freytes Jonesis a 65 y.o.malepresenting with right knee pain presenting with septic joint, readmit, has been off of antibiotics 8 days. PMH is significant for hypothyroidism, alcohol abuse, Hepatitis B, Hepatitis C, HTN, GERD, chronic pain syndrome, hx of TKA (both in 2015), hx of R septic knee (06/2015), major depression, anxiety, BPH.   # Right knee septic joint. s/p arthroscopy with irrigation and debridement x1, was last admitted 11/5 - 11/13, discharged with plans for home ceftriaxone IM for 6w with home health twice weekly, however was unable to take this medication due to cost and the medication not being covered. Patient advised to come back into the hospital for treatment, finally presents today s/p 7d without antibiotics. 1/4 SIRS criteria on admission. Leukocytosis to 12.7.  Sed rate 55. CRP 9.4. S/p joint aspiration by ortho in the ED. - continue to monitor - Ancef 2g q8h - f/u bcx, ucx, joint aspirate culture - follow-up ortho recs - ID on board, appreciate assistance - pain control with dilaudid 1mg  q2hr and percocet q4hr prn. Currently not using a lot of pain medication - PT/OT  # AKI Cr 2.36 on admission (baseline 1.16). May be prerenal given recent immobility/possible poor hydration. Cr this AM 2.14 - monitor on BMP daily -avoid nephrotoxic agents  # RLQ pain. Patient denies urinary urgency or frequency, has had normal BMs, pain is in sacral ligament radiating to groin.  No hematuria, no anorexia. X2 episodes emesis 3d ago. No peritoneal signs or fevers, so do not think this is  appendicitis. Pt had a BM yesterday. UA was negative.  - will monitor - pain maybe radiation of right leg pain given infection -consider pelvic/scrotol [place]   #history of substance abuse - patient denies alcohol or drug use at this time. History of alcohol use. +cocaine on his three most recent urine drug screens, though patient denies use. - repeat UDS with only benzos and opiods  #Hypertension. BP elevated to 146/53 on admission. Home regimen previously included propanolol which was discontinued at previous admission due to cocaine use.  - monitor BP - start amlodipine 5mg  daily hydralazine prn for elevated pressures  #Hepatitis B, chronic and active.At home on Viread 300 mg daily.  - Continue home Viread 300 mg daily - Monitor  #Hepatitis C, chronic and active. At home on Sofosbuvir-Velpatasvir [name]).  - Continue home Epclusa 400-100 mg po tab daily  - Monitor  #Hypothyroidism. Stable. At home on Levothyroxine. Last TSH 0.206 (12/29/2015).  - Continue home Synthroid 200 mcg daily - recheck TSH in 6 weeks  #difficulty sleeping. At home on Ambien 10 mg at bedtime.  - Ambien 5 mg PRN  #Depression . Stable. At home on Lexapro and Remeron. Also on Ambien 10mg  PRN  -Continue Lexapro 20 mg daily at bedtime   #BPH:On Silodosin 8mg  daily at home - flomax 0.4 mg Qd while hospitalized  #Chronic lumbar spine pain - stable. No neurologic deficits. - monitor  # ?Asthma - not indicated on problem list however albuterol on med list. - PRN albuterol  - Will follow up whether pt uses this  FEN/GI: KVO, Regular diet  Disposition: Awaiting further work-up. Plan to discharge to SNF with PICC.   Subjective:  Doing well this morning. Feels better since admission. Went to OR yesterday. Still have some RLQ pain.   Objective: Temp:  [97 F (36.1 C)-98.6 F (37 C)] 97.6 F (36.4 C) (11/22 1745) Pulse Rate:  [44-57] 53 (11/22 1743) Resp:  [13-18] 16 (11/22  1743) BP: (151-187)/(61-93) 178/77 (11/22 1743) SpO2:  [90 %-100 %] 94 % (11/22 1743) Physical Exam: General: NAD, rests comfortably in bed, alert Cardiovascular: RRR, no m/r/g Respiratory: CTA bil, no W/R/R Abdomen: soft and nontender, nondistended Extremities:+right knee wrapped in ACE bandage,2+ peripheral pulses bilaterally  Laboratory:  Recent Labs Lab 01/14/16 1149 01/15/16 0920 01/16/16 0503  WBC 12.7* 11.7* 9.1  HGB 12.7* 12.5* 12.1*  HCT 37.9* 37.5* 36.0*  PLT 334 354 345    Recent Labs Lab 01/14/16 1149 01/15/16 0920 01/16/16 0503  NA 134* 137 138  K 4.7 4.6 4.5  CL 103 107 108  CO2 25 24 24   BUN 16 14 14   CREATININE 2.36* 2.06* 2.14*  CALCIUM 8.4* 8.3* 8.3*  GLUCOSE 123* 84 84    Imaging/Diagnostic Tests: Dg Knee 2 Views Right  Result Date: 01/14/2016 IMPRESSION: RIGHT knee prosthesis without acute complication. Soft tissue swelling RIGHT knee. Electronically Signed   By: Lavonia Dana M.D.   On: 01/14/2016 12:39    Katheren Shams, DO 01/16/2016, 3:31 AM PGY-3, [place] Intern pager: [phone], text pages welcome

## 2016-01-16 NOTE — Progress Notes (Signed)
Paged FM Teaching Service regarding BP sustaining in [id number], systolic. Pt denies chest pain. Will continue to monitor pt. Waiting to hear back from dr.

## 2016-01-16 NOTE — Progress Notes (Signed)
CRITICAL VALUE ALERT  Critical value received:  Synovial Fluid culture is growing rare staph aureus   Date of notification:  01/16/16  Time of notification:  1200  Critical value read back:Yes.    Nurse who received alert:  Betha Loa  MD notified (1st page):  FM Resident   Time of first page:  1201  MD notified (2nd page):FM Resident  Time of second page:1215  Responding MD: Gerarda Fraction  Time MD responded: 1300

## 2016-01-17 ENCOUNTER — Encounter: Payer: Self-pay | Admitting: Infectious Disease

## 2016-01-17 ENCOUNTER — Encounter (HOSPITAL_COMMUNITY): Payer: Self-pay | Admitting: Orthopedic Surgery

## 2016-01-17 LAB — T4, FREE: Free T4: 1.07 ng/dL (ref 0.61–1.12)

## 2016-01-17 LAB — CBC
HCT: 38.9 % — ABNORMAL LOW (ref 39.0–52.0)
Hemoglobin: 12.7 g/dL — ABNORMAL LOW (ref 13.0–17.0)
MCH: 31.8 pg (ref 26.0–34.0)
MCHC: 32.6 g/dL (ref 30.0–36.0)
MCV: 97.3 fL (ref 78.0–100.0)
Platelets: 360 10*3/uL (ref 150–400)
RBC: 4 MIL/uL — ABNORMAL LOW (ref 4.22–5.81)
RDW: 14.4 % (ref 11.5–15.5)
WBC: 8 10*3/uL (ref 4.0–10.5)

## 2016-01-17 LAB — BASIC METABOLIC PANEL
Anion gap: 6 (ref 5–15)
BUN: 13 mg/dL (ref 6–20)
CALCIUM: 8.3 mg/dL — AB (ref 8.9–10.3)
CO2: 24 mmol/L (ref 22–32)
CREATININE: 2.03 mg/dL — AB (ref 0.61–1.24)
Chloride: 108 mmol/L (ref 101–111)
GFR calc Af Amer: 38 mL/min — ABNORMAL LOW (ref >60)
GFR, EST NON AFRICAN AMERICAN: 33 mL/min — AB (ref >60)
GLUCOSE: 99 mg/dL (ref 65–99)
Potassium: 4.1 mmol/L (ref 3.5–5.1)
Sodium: 138 mmol/L (ref 135–145)

## 2016-01-17 LAB — HIV ANTIBODY (ROUTINE TESTING W REFLEX): HIV SCREEN 4TH GENERATION: NONREACTIVE

## 2016-01-17 LAB — TSH: TSH: 6.303 u[IU]/mL — AB (ref 0.350–4.500)

## 2016-01-17 MED ORDER — AMLODIPINE BESYLATE 10 MG PO TABS
10.0000 mg | ORAL_TABLET | Freq: Every day | ORAL | Status: DC
Start: 2016-01-17 — End: 2016-01-24
  Administered 2016-01-17 – 2016-01-24 (×8): 10 mg via ORAL
  Filled 2016-01-17 (×8): qty 1

## 2016-01-17 NOTE — Progress Notes (Signed)
Family Medicine Teaching Service Daily Progress Note Intern Pager: [phone]  Patient name: Juan Wiggins Medical record number: [id number] Date of birth: September 26, 1950 Age: 65 y.o. Gender: male  Primary Care Provider: Helane Rima, MD Consultants:  Ortho, ID Code Status: Full  Pt Overview and Major Events to Date:  11/22 - Admitted for R septic knee (re-admission)  Assessment and Plan: Juan Savarino Jonesis a 65 y.o.malepresenting with right knee pain presenting with septic joint, readmit, has been off of antibiotics 8 days. PMH is significant for hypothyroidism, alcohol abuse, Hepatitis B, Hepatitis C, HTN, GERD, chronic pain syndrome, hx of TKA (both in 2015), hx of R septic knee (06/2015), major depression, anxiety, BPH.   # Right knee septic joint. s/p arthroscopy with irrigation and debridement x1, was last admitted 11/5 - 11/13, discharged with plans for home ceftriaxone IM presented s/p 7d without antibiotics. Leukocytosis resolved. Joint cultures from aspirate  growing few staph aureus. - continue Ancef 2g q8h (11/21 > )  - ID advocates for removal of prosthesis and placement of antibiotic spacer followed by parenteral therapy. Options per ID include IV ortavancin followed by Dalbavancin followed by PO, vs using Linezolid - ID recommends continuing cefazolin for now pending decisions regarding surgery, though he may have MRSA - appreciate ID recs, will continue Ancef rather than starting Vanc - f/u bcx (NGx2D), arthoscopy culture, joint aspirate culture - follow-up ortho recs - pain control with dilaudid 1mg  q2hr and vicoden q4hr prn.  - PT/OT  # AKI Cr 2.36 on admission (baseline 1.16). May be prerenal given recent immobility/possible poor hydration. Cr this AM improved at 2.03 - monitor on BMP daily -avoid nephrotoxic agents  # RLQ pain. Patient denies urinary urgency or frequency, has had normal BMs, pain is in sacral ligament radiating to groin.  No hematuria, no anorexia.  X2 episodes emesis 3d ago. No peritoneal signs or fevers, so do not think this is appendicitis. Pt had a BM yesterday. UA was negative.  - will monitor - pain maybe radiation of right leg pain given infection -consider pelvic/scrotol [place]   #history of substance abuse - patient denies alcohol or drug use at this time. History of alcohol use. +cocaine on his three most recent urine drug screens, though patient denies use. - repeat UDS with only benzos and opiods  #Hypertension. BP elevated to 146/53 on admission. Home regimen previously included propanolol which was discontinued at previous admission due to cocaine use.  Started on 5 mg amlodipine. Pressures remained elevated 160/75 overnight. - will inc to 10 mg amlodipine daily - monitor BP - consider hydralazine prn for elevated pressures  #Hepatitis B, chronic and active.At home on Viread 300 mg daily.  - Continue home Viread 300 mg daily - Monitor  #Hepatitis C, chronic and active. At home on Sofosbuvir-Velpatasvir Juan Wiggins).  - Continue home Epclusa 400-100 mg po tab daily  - Monitor  #Hypothyroidism. Stable. At home on Levothyroxine. Last TSH 0.206 (12/29/2015).  - Continue home Synthroid 200 mcg daily - recheck TSH, T4 today  #difficulty sleeping. At home on Ambien 10 mg at bedtime.  - Ambien 5 mg PRN  #Depression . Stable. At home on Lexapro and Remeron. Also on Ambien 10mg  PRN  -Continue Lexapro 20 mg daily at bedtime   #BPH:On Silodosin 8mg  daily at home - flomax 0.4 mg Qd while hospitalized  #Chronic lumbar spine pain - stable. No neurologic deficits. - monitor  # ?Asthma - not indicated on problem list however albuterol on med list. -  PRN albuterol  - Will follow up whether pt uses this  FEN/GI: KVO, Regular diet  Disposition: Awaiting further work-up. Plan to discharge to SNF with PICC.   Subjective:  Patient complains of some right knee pain overnight. Sitting comfortably in bed. Bandage  in place over knee. Eating breakfast.  States he will think about SNF today if we really feel it is the best option. Does note some diarrhea overnight (one episode).  Objective: Temp:  [97.3 F (36.3 C)-98.2 F (36.8 C)] 97.8 F (36.6 C) (11/24 0434) Pulse Rate:  [50-60] 57 (11/24 0434) Resp:  [18-21] 18 (11/24 0434) BP: (141-160)/(70-75) 160/75 (11/24 0434) SpO2:  [96 %-98 %] 97 % (11/24 0434) Physical Exam: General: NAD, resting comfortably in bed, alert Cardiovascular: RRR, no m/r/g Respiratory: CTA bil, no W/R/R Abdomen: soft and nontender, nondistended, normoactive bowel sounds Extremities:+right knee wrapped in ACE bandage,2+ peripheral pulses bilaterally  Laboratory:  Recent Labs Lab 01/15/16 0920 01/16/16 0503 01/17/16 0640  WBC 11.7* 9.1 8.0  HGB 12.5* 12.1* 12.7*  HCT 37.5* 36.0* 38.9*  PLT 354 345 360    Recent Labs Lab 01/15/16 0920 01/16/16 0503 01/17/16 0640  NA 137 138 138  K 4.6 4.5 4.1  CL 107 108 108  CO2 24 24 24   BUN 14 14 13   CREATININE 2.06* 2.14* 2.03*  CALCIUM 8.3* 8.3* 8.3*  GLUCOSE 84 84 99    Imaging/Diagnostic Tests: Dg Knee 2 Views Right  Result Date: 01/14/2016 IMPRESSION: RIGHT knee prosthesis without acute complication. Soft tissue swelling RIGHT knee. Electronically Signed   By: Lavonia Dana M.D.   On: 01/14/2016 12:39    [name], MD 01/17/2016, 11:07 AM PGY-1, [place] Intern pager: [phone], text pages welcome

## 2016-01-17 NOTE — Progress Notes (Addendum)
Subjective: No new complaints   Antibiotics:  Anti-infectives    Start     Dose/Rate Route Frequency Ordered Stop   01/17/16 0900  tenofovir (VIREAD) tablet 300 mg     300 mg Oral Every 48 hours 01/15/16 1330     01/16/16 1700  ceFAZolin (ANCEF) IVPB 2g/100 mL premix     2 g 200 mL/hr over 30 Minutes Intravenous Every 8 hours 01/16/16 1433     01/15/16 1930  ceFAZolin (ANCEF) IVPB 1 g/50 mL premix  Status:  Discontinued     1 g 100 mL/hr over 30 Minutes Intravenous Every 6 hours 01/15/16 1923 01/15/16 2042   01/15/16 1930  ceFAZolin (ANCEF) IVPB 1 g/50 mL premix  Status:  Discontinued     1 g 100 mL/hr over 30 Minutes Intravenous Every 6 hours 01/15/16 1923 01/15/16 1932   01/15/16 1800  ceFAZolin (ANCEF) IVPB 2g/100 mL premix  Status:  Discontinued     2 g 200 mL/hr over 30 Minutes Intravenous Every 8 hours 01/15/16 1344 01/16/16 1412   01/15/16 1000  tenofovir (VIREAD) tablet 300 mg  Status:  Discontinued     300 mg Oral Daily 01/14/16 1539 01/15/16 1330   01/14/16 2200  ceFAZolin (ANCEF) IVPB 2g/100 mL premix  Status:  Discontinued     2 g 200 mL/hr over 30 Minutes Intravenous Every 12 hours 01/14/16 1546 01/15/16 1344   01/14/16 1545  Sofosbuvir-Velpatasvir 400-100 MG TABS 1 tablet     1 tablet Oral Daily 01/14/16 1539     01/14/16 1400  ceFAZolin (ANCEF) IVPB 2g/100 mL premix  Status:  Discontinued     2 g 200 mL/hr over 30 Minutes Intravenous Every 8 hours 01/14/16 1154 01/14/16 1546      Medications: Scheduled Meds: . amLODipine  10 mg Oral Daily  . aspirin EC  325 mg Oral Q breakfast  .  ceFAZolin (ANCEF) IV  2 g Intravenous Q8H  . escitalopram  20 mg Oral Daily  . ferrous sulfate  325 mg Oral TID PC  . levothyroxine  200 mcg Oral QAC breakfast  . Sofosbuvir-Velpatasvir  1 tablet Oral Daily  . tamsulosin  0.4 mg Oral QPC breakfast  . tenofovir  300 mg Oral Q48H   Continuous Infusions: . lactated ringers    . lactated ringers     PRN  Meds:.acetaminophen **OR** acetaminophen, albuterol, bisacodyl, hydrALAZINE, HYDROcodone-acetaminophen, HYDROmorphone (DILAUDID) injection, hydrOXYzine, menthol-cetylpyridinium **OR** phenol, methocarbamol **OR** methocarbamol (ROBAXIN)  IV, methocarbamol, metoCLOPramide **OR** metoCLOPramide (REGLAN) injection, ondansetron **OR** ondansetron (ZOFRAN) IV, polyethylene glycol, zolpidem    Objective: Weight change:   Intake/Output Summary (Last 24 hours) at 01/17/16 1642 Last data filed at 01/17/16 1500  Gross per 24 hour  Intake             1000 ml  Output                0 ml  Net             1000 ml   Blood pressure (!) 163/85, pulse 61, temperature 98.9 F (37.2 C), temperature source Oral, resp. rate 20, height 5' 11.5" (1.816 m), weight 184 lb (83.5 kg), SpO2 98 %. Temp:  [97.7 F (36.5 C)-98.9 F (37.2 C)] 98.9 F (37.2 C) (11/24 1622) Pulse Rate:  [50-61] 61 (11/24 1622) Resp:  [18-20] 20 (11/24 1622) BP: (141-163)/(70-85) 163/85 (11/24 1622) SpO2:  [97 %-98 %] 98 % (11/24 1622)  Physical Exam: General:  Alert and awake, oriented x3, not in any acute distress. HEENT: anicteric sclera,  EOMI, oropharynx clear and without exudate Cardiovascular: egular rate, normal r,  no murmur rubs or gallops Pulmonary: clear to auscultation bilaterally, no wheezing, rales or rhonchi Gastrointestinal: soft nontender, nondistended, normal bowel sounds, Musculoskeletal: knee wrapped in bandage Skin, soft tissue: no rashes Neuro: nonfocal, strength and sensation intact   CBC: CBC Latest Ref Rng & Units 01/17/2016 01/16/2016 01/15/2016  WBC 4.0 - 10.5 K/uL 8.0 9.1 11.7(H)  Hemoglobin 13.0 - 17.0 g/dL 12.7(L) 12.1(L) 12.5(L)  Hematocrit 39.0 - 52.0 % 38.9(L) 36.0(L) 37.5(L)  Platelets 150 - 400 K/uL 360 345 354      BMET  Recent Labs  01/16/16 0503 01/17/16 0640  NA 138 138  K 4.5 4.1  CL 108 108  CO2 24 24  GLUCOSE 84 99  BUN 14 13  CREATININE 2.14* 2.03*  CALCIUM 8.3* 8.3*      Liver Panel  No results for input(s): PROT, ALBUMIN, AST, ALT, ALKPHOS, BILITOT, BILIDIR, IBILI in the last 72 hours.     Sedimentation Rate No results for input(s): ESRSEDRATE in the last 72 hours. C-Reactive Protein No results for input(s): CRP in the last 72 hours.  Micro Results: Recent Results (from the past 720 hour(s))  Blood culture (routine x 2)     Status: None   Collection Time: 12/29/15 11:30 AM  Result Value Ref Range Status   Specimen Description BLOOD LEFT ARM  Final   Special Requests BOTTLES DRAWN AEROBIC AND ANAEROBIC 5CC  Final   Culture NO GROWTH 5 DAYS  Final   Report Status 01/03/2016 FINAL  Final  Blood culture (routine x 2)     Status: None   Collection Time: 12/29/15 11:36 AM  Result Value Ref Range Status   Specimen Description BLOOD RIGHT ARM  Final   Special Requests BOTTLES DRAWN AEROBIC AND ANAEROBIC 5CC  Final   Culture NO GROWTH 5 DAYS  Final   Report Status 01/03/2016 FINAL  Final  Body fluid culture     Status: None   Collection Time: 12/29/15 12:30 PM  Result Value Ref Range Status   Specimen Description SYNOVIAL RIGHT KNEE  Final   Special Requests NONE  Final   Gram Stain   Final    ABUNDANT WBC PRESENT, PREDOMINANTLY PMN RARE GRAM POSITIVE COCCI IN CLUSTERS    Culture FEW STAPHYLOCOCCUS AUREUS  Final   Report Status 12/31/2015 FINAL  Final   Organism ID, Bacteria STAPHYLOCOCCUS AUREUS  Final      Susceptibility   Staphylococcus aureus - MIC*    CIPROFLOXACIN <=0.5 SENSITIVE Sensitive     ERYTHROMYCIN <=0.25 SENSITIVE Sensitive     GENTAMICIN <=0.5 SENSITIVE Sensitive     OXACILLIN 0.5 SENSITIVE Sensitive     TETRACYCLINE <=1 SENSITIVE Sensitive     VANCOMYCIN <=0.5 SENSITIVE Sensitive     TRIMETH/SULFA <=10 SENSITIVE Sensitive     CLINDAMYCIN <=0.25 SENSITIVE Sensitive     RIFAMPIN <=0.5 SENSITIVE Sensitive     Inducible Clindamycin NEGATIVE Sensitive     * FEW STAPHYLOCOCCUS AUREUS  Urine culture     Status: Abnormal     Collection Time: 12/29/15  1:15 PM  Result Value Ref Range Status   Specimen Description URINE, RANDOM  Final   Special Requests NONE  Final   Culture <10,000 COLONIES/mL INSIGNIFICANT GROWTH (A)  Final   Report Status 12/30/2015 FINAL  Final  MRSA PCR Screening     Status: None  Collection Time: 12/29/15  4:00 PM  Result Value Ref Range Status   MRSA by PCR NEGATIVE NEGATIVE Final    Comment:        The GeneXpert MRSA Assay (FDA approved for NASAL specimens only), is one component of a comprehensive MRSA colonization surveillance program. It is not intended to diagnose MRSA infection nor to guide or monitor treatment for MRSA infections.   Aerobic/Anaerobic Culture (surgical/deep wound)     Status: None   Collection Time: 01/02/16  2:05 PM  Result Value Ref Range Status   Specimen Description FLUID RIGHT KNEE  Final   Special Requests SWAB  Final   Gram Stain   Final    FEW WBC PRESENT, PREDOMINANTLY PMN NO ORGANISMS SEEN    Culture No growth aerobically or anaerobically.  Final   Report Status 01/07/2016 FINAL  Final  Blood culture (routine x 2)     Status: None (Preliminary result)   Collection Time: 01/14/16 11:53 AM  Result Value Ref Range Status   Specimen Description BLOOD LEFT ARM  Final   Special Requests BOTTLES DRAWN AEROBIC AND ANAEROBIC 5CC  Final   Culture NO GROWTH 3 DAYS  Final   Report Status PENDING  Incomplete  Blood culture (routine x 2)     Status: None (Preliminary result)   Collection Time: 01/14/16 12:15 PM  Result Value Ref Range Status   Specimen Description BLOOD RIGHT ARM  Final   Special Requests BOTTLES DRAWN AEROBIC AND ANAEROBIC 5CC  Final   Culture NO GROWTH 3 DAYS  Final   Report Status PENDING  Incomplete  Aerobic/Anaerobic Culture (surgical/deep wound)     Status: None (Preliminary result)   Collection Time: 01/14/16  6:44 PM  Result Value Ref Range Status   Specimen Description FLUID SYNOVIAL RIGHT KNEE  Final   Special  Requests NONE  Final   Gram Stain   Final    MODERATE WBC PRESENT, PREDOMINANTLY PMN NO ORGANISMS SEEN    Culture   Final    FEW STAPHYLOCOCCUS AUREUS CRITICAL RESULT CALLED TO, READ BACK BY AND VERIFIED WITH: C. HANCOCK RN, AT 1202 01/16/16 BY D. VANHOOK REGARDING GRAM STAIN NO ANAEROBES ISOLATED; CULTURE IN PROGRESS FOR 5 DAYS    Report Status PENDING  Incomplete   Organism ID, Bacteria STAPHYLOCOCCUS AUREUS  Final      Susceptibility   Staphylococcus aureus - MIC*    CIPROFLOXACIN <=0.5 SENSITIVE Sensitive     ERYTHROMYCIN <=0.25 SENSITIVE Sensitive     GENTAMICIN <=0.5 SENSITIVE Sensitive     OXACILLIN <=0.25 SENSITIVE Sensitive     TETRACYCLINE <=1 SENSITIVE Sensitive     VANCOMYCIN 1 SENSITIVE Sensitive     TRIMETH/SULFA <=10 SENSITIVE Sensitive     CLINDAMYCIN <=0.25 SENSITIVE Sensitive     RIFAMPIN <=0.5 SENSITIVE Sensitive     Inducible Clindamycin NEGATIVE Sensitive     * FEW STAPHYLOCOCCUS AUREUS  Surgical pcr screen     Status: None   Collection Time: 01/15/16 12:09 AM  Result Value Ref Range Status   MRSA, PCR NEGATIVE NEGATIVE Final   Staphylococcus aureus NEGATIVE NEGATIVE Final    Comment:        The Xpert SA Assay (FDA approved for NASAL specimens in patients over 4 years of age), is one component of a comprehensive surveillance program.  Test performance has been validated by [facility] for patients greater than or equal to 21 year old. It is not intended to diagnose infection nor to guide or  monitor treatment.   Body fluid culture     Status: None (Preliminary result)   Collection Time: 01/15/16  4:37 PM  Result Value Ref Range Status   Specimen Description SYNOVIAL RIGHT KNEE  Final   Special Requests FLUID ON SWAB PATIENT ON FOLLOWING ANCEF  Final   Gram Stain   Final    ABUNDANT WBC PRESENT, PREDOMINANTLY PMN NO ORGANISMS SEEN    Culture   Final    RARE STAPHYLOCOCCUS AUREUS CRITICAL VALUE NOTED.  VALUE IS CONSISTENT WITH PREVIOUSLY  REPORTED AND CALLED VALUE.    Report Status PENDING  Incomplete    Studies/Results: No results found.    Assessment/Plan:  INTERVAL HISTORY:   MSSA and SA S pending repeatedly growing.   Active Problems:   Infection of total knee replacement (HCC)   Septic arthritis (HCC)   Staphylococcal arthritis of right knee (HCC)   IVDU (intravenous drug user)    Juan Wiggins is a 65 y.o. male with  65 y.o. 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 He has a history is highly suspicious for intravenous drug use with both hepatitis B and hepatitis C infections and tox screens positive for cocaine. He apparently did not receive parenteral therapy in the form of intramuscular or highly bioavailable therapy when he left the hospital most recently and was unwilling to go into a skilled nursing facility he is come back and had repeat I&D with now Staphylococcus aureus growing yet again.  #1 Staphylococcus aureus prosthetic joint infection:  Had an extensive conversation with Dr. Gladstone Lighter agrees that the best option to control this patient's infection would involve removal of his prosthetic joint and placement of antibiotic beads.  He is planning on taking the patient to the operating room next week if the patient will agree to it and the patient seemed agreeable when I spoke with him earlier today.  The interim we'll continue IV cefazolin  Postoperatively as he stabilizes would then give him  long-acting antibiotic IV ORITAVANCIN stay in his system for 2 weeks at least followed by another long acting DALBAVANCIN, followed by PO vs using bioavailable PO option such as zyvox   hepatitis B he is on Viread every other day the prior discussion with regards to Taf  Hepatitis C continuing on therapy  I will follow him peripherally for now and continue to follow his cultures.  Again plan is for surgery next  week and once he is stabilized long-acting antibiotics as described above.   LOS: 3 days   Alcide Evener 01/17/2016, 4:42 PM

## 2016-01-17 NOTE — Progress Notes (Signed)
Family Medicine Teaching Service Daily Progress Note Intern Pager: [phone]  Patient name: Juan Wiggins Medical record number: [id number] Date of birth: [birth year] Age: 65 y.o. Gender: male  Primary Care Provider: Helane Rima, MD Consultants:  Ortho, ID Code Status: Full  Pt Overview and Major Events to Date:  11/22 - Admitted for R septic knee (re-admission), joint aspiration by ortho 11/23 - Surgical I&D R knee by orthopedics  Assessment and Plan: Juan Tajima Jonesis a 65 y.o.malepresenting with right knee pain presenting with septic joint, readmit, has been off of antibiotics 7 days. PMH is significant for hypothyroidism, alcohol abuse, Hepatitis B, Hepatitis C, HTN, GERD, chronic pain syndrome, hx of TKA (both in 2015), hx of R septic knee (06/2015), major depression, anxiety, BPH.   # Right knee septic joint. s/p arthroscopy with irrigation and debridement x1, was last admitted 11/5 - 11/13, discharged with plans for home ceftriaxone IM presented s/p 7d without antibiotics. Leukocytosis resolved. Joint cultures from aspirate  growing few staph aureus. - continue Ancef 2g q8h (11/21 > )  - ID advocates for removal of prosthesis and placement of antibiotic spacer followed by parenteral therapy. Options per ID include IV ortavancin followed by Dalbavancin followed by PO, vs using Linezolid - ID recommends continuing cefazolin for now pending decisions regarding surgery, though he may have MRSA - appreciate ID recs - f/u bcx (NGx2D), arthoscopy culture, joint aspirate culture - follow-up ortho recs - pain control with dilaudid 1mg  q2hr and vicoden q4hr prn.  - PT/OT  # AKI Cr 2.36 >2.06  (baseline 1.16). May be prerenal given recent immobility/possible poor hydration.  - will recheck BMP tomorrow AM - avoid nephrotoxic agents  # RLQ pain. Patient denies urinary urgency or frequency, has had normal BMs, pain is in sacral ligament radiating to groin.  No hematuria, no anorexia.  No peritoneal signs or fevers, so do not think this is appendicitis. +2 episodes non-bloody diarrhea.   - will monitor - pain maybe radiation of right leg pain given infection - consider pelvic/scrotol [place]   # history of substance abuse - patient denies alcohol or drug use at this time. History of alcohol use. +cocaine on his three most recent urine drug screens, though patient denies use. - repeat UDS with only benzos and opiods  # Hypertension. BP elevated to 146/53 on admission. Home regimen previously included propanolol which was discontinued at previous admission due to cocaine use.  Started on 5 mg amlodipine. Pressures still poorly controlled 164/55>>152/62 overnight. - continue 10 mg amlodipine daily - monitor BP - consider hydralazine prn for elevated pressures  #Hepatitis B, chronic and active.At home on Viread 300 mg daily.  - Continue home Viread 300 mg daily - Monitor  #Hepatitis C, chronic and active. At home on Sofosbuvir-Velpatasvir [name]).  - Continue home Epclusa 400-100 mg po tab daily  - Monitor  #Hypothyroidism. Stable. At home on Levothyroxine. Previously TSH 0.20 on last admission with 200 mcg synthroid daily however dose not changed given acute illness.  Repeat this admission TSH 6.303, fT4 WNL at 1.07.   - Continue home Synthroid 200 mcg daily - will need recheck TSH in 6 weeks   #difficulty sleeping. At home on Ambien 10 mg at bedtime.  - Ambien 5 mg PRN  #Depression . Stable. At home on Lexapro and Remeron. Also on Ambien 10mg  PRN  -Continue Lexapro 20 mg daily at bedtime   #BPH:On Silodosin 8mg  daily at home - flomax 0.4 mg Qd while hospitalized  #  Chronic lumbar spine pain - stable. No neurologic deficits. - monitor  # ?Asthma - not indicated on problem list however albuterol on med list. - PRN albuterol  - Will follow up whether pt uses this  FEN/GI: KVO, Regular diet  Disposition: Awaiting further work-up. Plan to  discharge to SNF with PICC.   Subjective:  Patient complains of some right knee pain overnight that woke him from sleep. Now sleeping comfortably in bed wakes easily. Endorses 2 episodes of diarrhea yesterday all day. Encouraged SNF. Still leaning towards home with alternative plan.  Objective: Temp:  [98.2 F (36.8 C)-98.9 F (37.2 C)] 98.2 F (36.8 C) (11/25 0504) Pulse Rate:  [53-65] 53 (11/25 0504) Resp:  [18-20] 18 (11/25 0504) BP: (152-164)/(55-85) 152/62 (11/25 0504) SpO2:  [96 %-98 %] 96 % (11/25 0504) Physical Exam: General: NAD, lies comfortably in bed, alert Cardiovascular: RRR, no m/r/g Respiratory: CTA bil, no W/R/R Abdomen: soft, nt, nd, normoactive BS Extremities:+right knee wrapped in ACE bandage,2+ DP pulses bilaterally  Laboratory:  Recent Labs Lab 01/15/16 0920 01/16/16 0503 01/17/16 0640  WBC 11.7* 9.1 8.0  HGB 12.5* 12.1* 12.7*  HCT 37.5* 36.0* 38.9*  PLT 354 345 360    Recent Labs Lab 01/15/16 0920 01/16/16 0503 01/17/16 0640  NA 137 138 138  K 4.6 4.5 4.1  CL 107 108 108  CO2 24 24 24   BUN 14 14 13   CREATININE 2.06* 2.14* 2.03*  CALCIUM 8.3* 8.3* 8.3*  GLUCOSE 84 84 99    Imaging/Diagnostic Tests: Dg Knee 2 Views Right  Result Date: 01/14/2016 IMPRESSION: RIGHT knee prosthesis without acute complication. Soft tissue swelling RIGHT knee. Electronically Signed   By: Lavonia Dana M.D.   On: 01/14/2016 12:39    [name], MD 01/18/2016, 5:58 AM PGY-1, [facility] Intern pager: [phone], text pages welcome

## 2016-01-17 NOTE — Progress Notes (Signed)
Juan Wiggins  MRN: [id number] DOB/Age: 63/01/1951 63 y.o. Physician: Gladstone Lighter Procedure: Procedure(s) (LRB): ARTHROSCOPIC WASHOUT (Right)     Subjective: Overall no specific c/o. Pain as expected  Vital Signs Temp:  [97.3 F (36.3 C)-98.2 F (36.8 C)] 97.8 F (36.6 C) (11/24 0434) Pulse Rate:  [50-60] 57 (11/24 0434) Resp:  [18-21] 18 (11/24 0434) BP: (141-160)/(70-75) 160/75 (11/24 0434) SpO2:  [96 %-98 %] 97 % (11/24 0434)  Lab Results  Recent Labs  01/16/16 0503 01/17/16 0640  WBC 9.1 8.0  HGB 12.1* 12.7*  HCT 36.0* 38.9*  PLT 345 360   BMET  Recent Labs  01/16/16 0503 01/17/16 0640  NA 138 138  K 4.5 4.1  CL 108 108  CO2 24 24  GLUCOSE 84 99  BUN 14 13  CREATININE 2.14* 2.03*  CALCIUM 8.3* 8.3*   INR  Date Value Ref Range Status  12/29/2015 1.27  Final     Exam Right knee dressing removed. Portals all clean and dry Minimal swelling and no erythema Has early good motion        Plan Cont current antibiotic course per ID and Dr. Loletha Grayer PA-C   01/17/2016, 10:48 AM Contact # [phone]

## 2016-01-17 NOTE — Discharge Summary (Signed)
[facility] Discharge Summary  Patient name: Juan Wiggins Medical record number: [id number] Date of birth: 04-Oct-1950 Age: 65 y.o. Gender: male Date of Admission: 01/14/2016  Date of Discharge: 01/24/16  Admitting Physician: Kinnie Feil, MD  Primary Care Provider: Helane Rima, MD Consultants: Orthopedics, Infectious diseases  Indication for Hospitalization: Septic Right Knee  Discharge Diagnoses/Problem List:  Patient Active Problem List   Diagnosis Date Noted  . Seizure ([place])   . IVDU (intravenous drug user)   . Staphylococcal arthritis of right knee ([place])   . Septic arthritis ([place]) 01/14/2016  . Lumbar spine pain   . Difficulty in walking, not elsewhere classified   . Infection of total knee replacement ([place]) 01/02/2016  . Pleural effusion   . Left upper quadrant pain   . Pain   . 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 Infection 12/29/2015  . Lactic acidosis   . Pyogenic arthritis of right knee joint ([place]) 07/18/2015  . Bacteremia due to Streptococcus 07/18/2015  . Cellulitis of right upper extremity 07/18/2015  . Chronic hepatitis C without hepatic coma ([place]) 07/18/2015  . Anemia 07/18/2015  . Opacity of lung on imaging study, bilateral 07/18/2015  . Anxiety state 07/18/2015  . History of total knee arthroplasty 07/16/2015  . Severe sepsis ([place]) 07/14/2015  . Moderate episode of recurrent major depressive disorder ([place])   . Chronic pain syndrome 05/21/2014  . Essential hypertension 08/28/2013  . GERD (gastroesophageal reflux disease) 08/28/2013  . Osteoarthritis of left knee 08/23/2013  . Total knee replacement status 08/23/2013  . Alcohol dependence ([place]) 07/14/2013  . Alcohol dependency ([place]) 07/07/2013  . Major depression 07/07/2013  . Varicose veins of lower extremities with other complications 19/14/7829  . Recurrent falls 04/14/2011  . Hypotension 04/13/2011  . ARF (acute renal failure)  ([place]) 04/13/2011  . Hypothyroidism 04/13/2011  . Prolapsed and thrombosed 09/03/2010    Disposition: [facility]  Discharge Condition: Stable  Discharge Exam:  General: NAD, lies comfortably in bed eating breakfast Cardiovascular: RRR, no m/r/g Respiratory: CTA bil, no W/R/R Abdomen: soft, nontender this AM, nd, normoactive BS Extremities:+right with with staples over R knee, no discharge or bleeding from wound Neuro: CN II-XII  intact  Brief Hospital Course:  Juan Wiggins a 65 y.o.malewith recent history of admission for septic right knee, discharged on IM rocephin x6 weeks after refusing SNF (cocaine on UDS so unable to discharge the patient with a PICC line), who presented with right knee pain and septic joint after not filling his antibiotic prescription after discharge from the hospital 8 days prior. Patient had gone about one week without any antibiotic coverage for his septic joint.  On admission the patient was started on cefazolin 2g q8h and rapidly taken for a joint aspiration by orthopedics for synovial fluid cultures, which subsequently grew rare staph aureus.  The patient underwent joint clean-out by orthopedics on 11/23.    On 11/26, he was noted to have 20 seconds of tonic-clonic seizure activity overnight which resolved on its own, did not require medical intervention. Neurology was consulted.  EEG was normal, MRI was negative for evidence of intracranial pathology that may incite seizure.  Neurology stated this is considered an unprovoked seizure, no seizure medications required unless the patient were to seize again. He was seizure-free throughout the remainder of his hospitalization.   It was noted that cephalosporins may lower the seizure threshold, which may have led to this patient's seizure.  As a result, Ancef was discontinued and the patient was transitioned to daptomycin per ID recommendations. The patient was agreeable to skilled nursing at discharge, and  he was sent with a PICC to continue receiving daptomycin after discharge.  Issues for Follow Up:  1. Septic R Knee:  Patient discharged with PICC in place, Daptomycin to be continued through January 4th.  (Or as long as he agrees to stay at the SNF).   1. Labs orders weekly per ID MD: CBC with diff, CMP, CRP, ESR, CK. 2. Remove PICC at completion of antibiotics OR prior to discharge from SNF if patient attempts to leave prior to course completion.   2. HTN: Patient previously on propanolol which was discontinued at recent hospital admission due to active cocaine use per UDS (patient denies.)  Hypertensive this admission so started on Norvasc which was titrated up to 10 mg daily. Please follow up BP. 3. Hypothyroidism: Previously 0.203 on last admission with 200 mcg synthroid daily however dose not changed given acute illness.  repeat this admission TSH 6.303, fT4 WNL at 1.07.  Synthroid dose kept same. Please follow up TSH in 6 weeks. 4. Pain: please continue to decrease his pain regimen. Discharging with a 25% decrease from regimen he was using while inpatient. I would NOT discharge from SNF with any pain medicines given history of drug abuse.  Significant Procedures:  11/22 -  joint aspiration by orthopedics 11/23 - Surgical I&D R knee by orthopedics 11/28 - R knee prosthesis removal and placement of antibiotic beads by ortho  Significant Labs and Imaging:   Recent Labs Lab 01/22/16 0610 01/23/16 0344 01/24/16 0425  WBC 11.8* 9.3 9.9  HGB 10.7* 10.4* 10.1*  HCT 32.4* 31.0* 30.5*  PLT 306 263 290    Recent Labs Lab 01/19/16 0137  01/20/16 0423 01/21/16 0442 01/22/16 0610 01/23/16 0344 01/24/16 0425  NA 136  < > 139 138 137 135 136  K 3.8  < > 4.2 3.9 5.2* 3.8 4.6  CL 105  < > 108 108 104 105 102  CO2 24  < > '25 24 26 26 28  '$ GLUCOSE 105*  < > 108* 90 128* 94 105*  BUN 9  < > '8 9 10 9 10  '$ CREATININE 1.57*  < > 1.47* 1.30* 1.35* 1.22 1.33*  CALCIUM 8.5*  < > 8.6* 8.7* 9.1  8.8* 9.4  MG 1.8  --   --   --   --   --   --   ALKPHOS 123  --   --   --   --   --   --   AST 27  --   --   --   --   --   --   ALT <5*  --   --   --   --   --   --   ALBUMIN 2.4*  --   --   --   --   --   --   < > = values in this interval not displayed.    Results/Tests Pending at Time of Discharge: None  Discharge Medications:    Medication List    STOP taking these medications   oxyCODONE 5 MG immediate release tablet Commonly known as:  Oxy IR/ROXICODONE   promethazine 12.5 MG tablet Commonly known as:  PHENERGAN   propranolol ER 60 MG 24 hr capsule Commonly known as:  INDERAL LA   zolpidem 10 MG tablet Commonly known as:  AMBIEN     TAKE these medications   albuterol 108 (90 Base) MCG/ACT inhaler Commonly known as:  PROVENTIL HFA;VENTOLIN HFA Inhale 2-4 puffs into the lungs daily as needed for shortness of breath.   amLODipine 10 MG tablet Commonly known as:  NORVASC Take 1 tablet (10 mg total) by mouth daily.   aspirin 325 MG tablet Take 1 tablet (325 mg total) by mouth 2 (two) times daily. What changed:  when to take this   bisacodyl 5 MG EC tablet Commonly known as:  DULCOLAX Take 1 tablet (5 mg total) by mouth daily as needed for moderate constipation.   Cholecalciferol 2000 units Caps Take 6,000 Units by mouth daily.   DAPTOmycin 700 mg in sodium chloride 0.9 % 100 mL Inject 700 mg into the vein daily. Start taking on:  01/25/2016   escitalopram 20 MG tablet Commonly known as:  LEXAPRO Take 20 mg by mouth daily.   HYDROcodone-acetaminophen 5-325 MG tablet Commonly known as:  NORCO Take 1 tablet by mouth every 6 (six) hours as needed for moderate pain.   hydrOXYzine 25 MG tablet Commonly known as:  ATARAX/VISTARIL Take 1 tablet (25 mg total) by mouth 3 (three) times daily as needed. What changed:  when to take this   levothyroxine 200 MCG tablet Commonly known as:  SYNTHROID, LEVOTHROID Take 200 mcg by mouth daily before breakfast.    methocarbamol 500 MG tablet Commonly known as:  ROBAXIN Take 1 tablet (500 mg total) by mouth every 6 (six) hours as needed for muscle spasms.   morphine 30 MG 12 hr tablet Commonly known as:  MS CONTIN Take 1 tablet (30 mg total) by mouth every 12 (twelve) hours.   ondansetron 4 MG tablet Commonly known as:  ZOFRAN TAKE 1 TABLET BY MOUTH EVERY 8 HOURS AS NEEDED FOR NAUSEA OR VOMITING.   senna 8.6 MG Tabs tablet Commonly known as:  SENOKOT Take 1 tablet (8.6 mg total) by mouth daily as needed for mild constipation.   silodosin 8 MG Caps capsule Commonly known as:  RAPAFLO Take 8 mg by mouth daily with breakfast.   Sofosbuvir-Velpatasvir 400-100 MG Tabs Commonly known as:  EPCLUSA Take 1 tablet by mouth daily.   tenofovir 300 MG tablet Commonly known as:  VIREAD Take 1 tablet (300 mg total) by mouth daily.   valACYclovir 1000 MG tablet Commonly known as:  VALTREX Take 1,000 mg by mouth See admin instructions. Take 1 tablet by mouth daily for flares - until one day past outbreak       Discharge Instructions: Please refer to Patient Instructions section of EMR for full details.  Patient was counseled important signs and symptoms that should prompt return to medical care, changes in medications, dietary instructions, activity restrictions, and follow up appointments.   Follow-Up Appointments:  Contact information for follow-up providers    GIOFFRE,RONALD A, MD. Schedule an appointment as soon as possible for a visit in 1 week(s).   Specialty:  Orthopedic Surgery Contact information: [address] [place] [address] [phone]        Carlyle Basques, MD. Schedule an appointment as soon as possible for a visit in 3 week(s).   Specialty:  Infectious Diseases Why:  to discuss HepC labs and medicine Contact information: [place] [address] [phone]            Contact information for after-discharge care     Destination    [facility] Follow up.   Specialty:  [place] information: [address] [place] [phone]                  Sela Hilding, MD 01/24/2016, 2:41 PM PGY-1, [place]

## 2016-01-17 NOTE — Evaluation (Signed)
Physical Therapy Evaluation Patient Details Name: Juan Wiggins MRN: [id number] DOB: 1951-02-03 Today's Date: 01/17/2016   History of Present Illness  65 y.o. male presenting with right knee pain presenting with septic joint, readmit, has been off of antibiotics 8 days. PMH is significant for hypothyroidism, alcohol abuse, Hepatitis B, Hepatitis C,  HTN, GERD, chronic pain syndrome, hx of TKA (both in 2015), hx of R septic knee (06/2015), major depression, anxiety, BPH  Clinical Impression  Patient seen for mobility assessment. At this time, patient demonstrate modest deficits in functional mobility as indicated below. Will benefit from continued skilled PT to address deficits and maximize function. Will work toward progression away from [facility].     Follow Up Recommendations Home health PT (pending progress)    Equipment Recommendations  None recommended by PT    Recommendations for Other Services       Precautions / Restrictions Precautions Precautions: None Restrictions Weight Bearing Restrictions: Yes RLE Weight Bearing: Weight bearing as tolerated      Mobility  Bed Mobility Overal bed mobility: Independent             General bed mobility comments: no difficulty with bed mobility  Transfers Overall transfer level: Needs assistance Equipment used: Straight cane Transfers: Sit to/from Stand Sit to Stand: Supervision         General transfer comment: Supervision for safety, use of SPC. No physical assist required  Ambulation/Gait Ambulation/Gait assistance: Supervision Ambulation Distance (Feet): 110 Feet Assistive device: Straight cane Gait Pattern/deviations: Antalgic     General Gait Details: some modest instability noted with use of SPC, no physical assist required  Stairs            Wheelchair Mobility    Modified Rankin (Stroke Patients Only)       Balance   Sitting-balance support: No upper extremity supported Sitting balance-Leahy  Scale: Normal     Standing balance support: Single extremity supported Standing balance-Leahy Scale: Fair Standing balance comment: some instability noted, reliance on SPC for increased BOS                             Pertinent Vitals/Pain Pain Assessment: 0-10 Pain Score: 6  Pain Location: right knee Pain Descriptors / Indicators: Aching Pain Intervention(s): Monitored during session    Home Living                        Prior Function                 Hand Dominance        Extremity/Trunk Assessment                         Communication      Cognition Arousal/Alertness: Awake/alert Behavior During Therapy: WFL for tasks assessed/performed Overall Cognitive Status: Within Functional Limits for tasks assessed                      General Comments      Exercises     Assessment/Plan    PT Assessment    PT Problem List            PT Treatment Interventions      PT Goals (Current goals can be found in the Care Plan section)  Acute Rehab PT Goals Patient Stated Goal: to go home PT Goal Formulation: With patient Time For  Goal Achievement: 01/07/16 Potential to Achieve Goals: Good    Frequency Min 3X/week   Barriers to discharge        Co-evaluation               End of Session Equipment Utilized During Treatment: Gait belt Activity Tolerance: Patient tolerated treatment well Patient left: in bed;with call bell/phone within reach;with family/visitor present Nurse Communication: Mobility status         Time: [id number] PT Time Calculation (min) (ACUTE ONLY): 11 min   Charges:   PT Evaluation $PT Eval Low Complexity: 1 Procedure     PT G Codes:        Duncan Dull January 29, 2016, 9:34 AM Alben Deeds, PT DPT  [phone]

## 2016-01-17 NOTE — Evaluation (Signed)
Occupational Therapy Evaluation and Discharge Patient Details Name: Juan Wiggins MRN: [id number] DOB: 1950/12/12 Today's Date: 01/17/2016    History of Present Illness 65 y.o. male presenting with right knee pain presenting with septic joint, readmit, has been off of antibiotics 8 days. PMH is significant for hypothyroidism, alcohol abuse, Hepatitis B, Hepatitis C,  HTN, GERD, chronic pain syndrome, hx of TKA (both in 2015), hx of R septic knee (06/2015), major depression, anxiety, BPH   Clinical Impression   Pt reports he has been managing ADL independently PTA. Currently pt overall supervision for ADL and functional mobility. Reviewed all knee, safety, and ADL education; pt verbalized understanding. Pt considering d/c home vs SNF; either option is appropriate from OT standpoint. No further acute OT needs identified; signing off at this time. Please re-consult if needs change. Thank you for this referral.    Follow Up Recommendations  No OT follow up    Equipment Recommendations  None recommended by OT    Recommendations for Other Services       Precautions / Restrictions Precautions Precautions: None Restrictions Weight Bearing Restrictions: Yes RLE Weight Bearing: Weight bearing as tolerated      Mobility Bed Mobility Overal bed mobility: Independent             General bed mobility comments: no difficulty with bed mobility  Transfers Overall transfer level: Needs assistance Equipment used: Straight cane Transfers: Sit to/from Stand Sit to Stand: Supervision         General transfer comment: Supervision for safety, use of SPC. No physical assist required    Balance Overall balance assessment: Needs assistance Sitting-balance support: No upper extremity supported Sitting balance-Leahy Scale: Normal     Standing balance support: No upper extremity supported;During functional activity Standing balance-Leahy Scale: Fair Standing balance comment: some  instability noted, reliance on SPC for increased BOS                            ADL Overall ADL's : Needs assistance/impaired                                     Functional mobility during ADLs: Supervision/safety;Cane General ADL Comments: Pt overall supervision for safety with ADL and functional mobility. Reviewed home safety strategies, LB ADL compensatory strategies.     Vision Vision Assessment?: No apparent visual deficits   Perception     Praxis      Pertinent Vitals/Pain Pain Assessment: Faces Pain Score: 6  Faces Pain Scale: Hurts little more Pain Location: R knee Pain Descriptors / Indicators: Sore Pain Intervention(s): Monitored during session     Hand Dominance Right   Extremity/Trunk Assessment Upper Extremity Assessment Upper Extremity Assessment: Overall WFL for tasks assessed   Lower Extremity Assessment Lower Extremity Assessment: Defer to PT evaluation   Cervical / Trunk Assessment Cervical / Trunk Assessment: Normal   Communication Communication Communication: No difficulties   Cognition Arousal/Alertness: Awake/alert Behavior During Therapy: WFL for tasks assessed/performed Overall Cognitive Status: Within Functional Limits for tasks assessed                     General Comments       Exercises Exercises:  (discussed End Range exercise, verbal review of HEP)     Shoulder Instructions      Home Living Family/patient expects to be discharged to::  Private residence Living Arrangements: Parent Available Help at Discharge: Family;Available 24 hours/day Type of Home: House Home Access: Stairs to enter [employer] of Steps: 2 Entrance Stairs-Rails: Can reach both Home Layout: Able to live on main level with bedroom/bathroom     Bathroom Shower/Tub: Tub/shower unit Shower/tub characteristics: Curtain Biochemist, clinical: Standard Bathroom Accessibility: Yes   Home Equipment: Environmental consultant - 2  wheels;Cane - single point;Bedside commode;Shower seat;Hand held shower head;Wheelchair - manual;Crutches   Additional Comments: Lives with elderly father who has a live in caregiver, caregiver can A with if needed      Prior Functioning/Environment Level of Independence: Independent with assistive device(s)        Comments: Since knee sx, pt has been managing ADL independently and using cane for mobility.        OT Problem List:     OT Treatment/Interventions:      OT Goals(Current goals can be found in the care plan section) Acute Rehab OT Goals Patient Stated Goal: to go home OT Goal Formulation: All assessment and education complete, DC therapy  OT Frequency:     Barriers to D/C:            Co-evaluation              End of Session Equipment Utilized During Treatment: Other (comment) (cane)  Activity Tolerance: Patient tolerated treatment well Patient left: in bed;with call bell/phone within reach   Time: 0921-0935 OT Time Calculation (min): 14 min Charges:  OT General Charges $OT Visit: 1 Procedure OT Evaluation $OT Eval Low Complexity: 1 Procedure G-Codes:     Binnie Kand M.S., OTR/L Pager: [phone]  01/17/2016, 9:42 AM

## 2016-01-18 LAB — BODY FLUID CULTURE

## 2016-01-18 LAB — CBC
HEMATOCRIT: 36.6 % — AB (ref 39.0–52.0)
Hemoglobin: 12.4 g/dL — ABNORMAL LOW (ref 13.0–17.0)
MCH: 32.5 pg (ref 26.0–34.0)
MCHC: 33.9 g/dL (ref 30.0–36.0)
MCV: 95.8 fL (ref 78.0–100.0)
Platelets: 363 10*3/uL (ref 150–400)
RBC: 3.82 MIL/uL — ABNORMAL LOW (ref 4.22–5.81)
RDW: 14.3 % (ref 11.5–15.5)
WBC: 8.9 10*3/uL (ref 4.0–10.5)

## 2016-01-18 LAB — GLUCOSE, CAPILLARY: Glucose-Capillary: 108 mg/dL — ABNORMAL HIGH (ref 65–99)

## 2016-01-18 MED ORDER — LORAZEPAM 2 MG/ML IJ SOLN: 2.0000 mg | INTRAMUSCULAR | Status: DC | PRN

## 2016-01-18 MED ORDER — LORAZEPAM 2 MG/ML IJ SOLN
2.0000 mg | Freq: Once | INTRAMUSCULAR | Status: AC
  Administered 2016-01-19: 2 mg via INTRAVENOUS
  Filled 2016-01-18: qty 1

## 2016-01-18 MED ORDER — KETOTIFEN FUMARATE 0.025 % OP SOLN
1.0000 [drp] | Freq: Two times a day (BID) | OPHTHALMIC | Status: DC
  Administered 2016-01-18 – 2016-01-24 (×12): 1 [drp] via OPHTHALMIC
  Filled 2016-01-18 (×2): qty 5

## 2016-01-18 NOTE — Significant Event (Signed)
Rapid Response Event Note Called per floor RN regarding Pt having a seizure. Pt in chair found staring off into space followed by violent jerking of arms and legs. Pt bite tongue and was incontinent of urine.   Overview: Time Called: 2304 Arrival Time: 2308 Event Type: Neurologic  Initial Focused Assessment: Pt found back in bed. Alert but disoriented to time location and situation. Able to state name and follow simple commands. Appears post ictal. CBG 108. Pupils size 2 reactive, no gaze at this time. Right front of tongue with small bite mark, moderate amount of blood spit out earlier per RN. Tongue bruised and sore per Patient. Resident team at bedside.    Interventions: VS obtained yielding 169/79 HR 113 RR 15 Po2 100% on 2 LNC  Plan of Care (if not transferred): RN to monitor. Orders per FMTS.  Event Summary: Name of Physician Notified: Family Medicine Resident MD team at 2309    at    Outcome: Stayed in room and stabalized  Event End Time: [place], [place]

## 2016-01-18 NOTE — Progress Notes (Signed)
Pt called RN for pain medicine. Upone entering room pt sitting on the edge of the bed fully alert and cognition at the baseline. Pt started staring to the wall, when asked to look at RN, he stated he is looking at [employer].  Pt started full body seizure lasting 20 sec approximatelly, assisted to lateral position in the bed. Not responsive during seizure. Help called, oxygen 2L administered via . RR at the bedside, MD paged by charge nurse.  After seizure activity, VS completed, had 1 occurrence of being incontinent of urine. Alert and oriented to himself only, pupils reactive to light.  On reassessment pt fully alert and oriented, able to fallow commands, able to recall activities before seizure, neuro assessment at the baseline.  Pt assessed by residents, will be transferred to stepdown unit to be closely monitored.

## 2016-01-18 NOTE — Progress Notes (Signed)
Physical Therapy Treatment Patient Details Name: Juan Wiggins MRN: [id number] DOB: [birth year] Today's Date: 01/18/2016    History of Present Illness 65 y.o. male presenting with right knee pain presenting with septic joint, readmit, has been off of antibiotics 8 days. PMH is significant for hypothyroidism, alcohol abuse, Hepatitis B, Hepatitis C,  HTN, GERD, chronic pain syndrome, hx of TKA (both in 2015), hx of R septic knee (06/2015), major depression, anxiety, BPH    PT Comments    Continue to progress as tolerated with anticipated d/c home with HHPT.   Follow Up Recommendations  Home health PT     Equipment Recommendations  None recommended by PT    Recommendations for Other Services       Precautions / Restrictions Precautions Precautions: None Restrictions Weight Bearing Restrictions: Yes RLE Weight Bearing: Weight bearing as tolerated    Mobility  Bed Mobility               General bed mobility comments: not assessed  Transfers Overall transfer level: Modified independent Equipment used: Quad cane Transfers: Sit to/from Stand Sit to Stand: Modified independent (Device/Increase time)         General transfer comment: increased time and effort and use of AD  Ambulation/Gait Ambulation/Gait assistance: Supervision Ambulation Distance (Feet): 225 Feet Assistive device: Quad cane Gait Pattern/deviations: Step-through pattern Gait velocity:  (decreased)   General Gait Details: pt with safe use of AD and no physical assist required; mild antalgic gait   Stairs            Wheelchair Mobility    Modified Rankin (Stroke Patients Only)       Balance     Sitting balance-Leahy Scale: Normal       Standing balance-Leahy Scale: Fair                      Cognition Arousal/Alertness: Awake/alert Behavior During Therapy: WFL for tasks assessed/performed Overall Cognitive Status: Within Functional Limits for tasks assessed                       Exercises Total Joint Exercises Quad Sets: AROM;Right;10 reps Straight Leg Raises: AROM;Right;5 reps Long Arc Quad: AROM;Right;10 reps Knee Flexion: AROM;Right;10 reps    General Comments        Pertinent Vitals/Pain Pain Assessment: Faces Faces Pain Scale: Hurts little more Pain Location: R LE Pain Descriptors / Indicators: Aching;Sore Pain Intervention(s): Limited activity within patient's tolerance;Monitored during session;Premedicated before session;Repositioned    Home Living                      Prior Function            PT Goals (current goals can now be found in the care plan section) Acute Rehab PT Goals Patient Stated Goal: get better PT Goal Formulation: With patient Time For Goal Achievement: 01/07/16 Potential to Achieve Goals: Good Progress towards PT goals: Progressing toward goals    Frequency    Min 3X/week      PT Plan Current plan remains appropriate    Co-evaluation             End of Session Equipment Utilized During Treatment: Gait belt Activity Tolerance: Patient tolerated treatment well Patient left: with call bell/phone within reach;in chair     Time: 1500-1526 PT Time Calculation (min) (ACUTE ONLY): 26 min  Charges:  $Gait Training: 8-22 mins $Therapeutic Exercise: 8-22 mins  G Codes:      [place] April, PTA Pager: [phone]   01/18/2016, 4:25 PM

## 2016-01-18 NOTE — Progress Notes (Addendum)
FPTS Interim Progress Note  S: Paged because patient had a witnessed 20 second tonic-clonic seizure. Per nursing at bedside, the patient was in the process of tranfering himself from bed to chair when he stared into space, did not respond to commands, and then had a tonic clonic seizure for approximately 20 seconds.  Patient did bite his tongue and lose his urine associated with this event. Patient discontinued seizing on his own with nursing at bedside and we were subsequently paged.  Upon arrival to bedside, patient was oriented to person and place but not time.  Patient denies history of previous seizures, denies recent alcohol or drug use.  Patient does not remember seizing a few moments ago. Denies any pain or discomfort at this time other than in his right knee.   O: BP (!) 169/79   Pulse (!) 113   Temp 98.9 F (37.2 C) (Oral)   Resp 15   Ht 5' 11.5" (1.816 m)   Wt 83.5 kg (184 lb)   SpO2 100%   BMI 25.31 kg/m   Gen: Diaphoretic, pale male rests in bed, alert, NAD HEENT: EOMI, PERRL, +blood and trauma to right edge of tongue, mucous membranes dry CARD: mildly tachycardic, rhythm regular, no m/r/g PULM: CTA bil, no W/R/R ABD: Soft and nontender, nondistended SKIN: No abrasions or ecchymosis appreciated, non-jaundiced NEURO: CN II-XII intact 5+ strength in 4 extremities, +generalized tremulousness Psych: alert to person and place, states it is September 2005. Speech somewhat nonsensical/disordered: speaking clear words, but losing train of thought and seems confused  A/P: No history of seizures per patient and chart review.  Differential includes withdrawal (UDS positive for benzos and opioids this admission, CIWA 0 on 11/22 and no signs of withdrawal per vitals and our exam since admission. Ethanol <5 on 11/5, not redrawn this admission so alcohol withdrawal possible. Patient has been clinically without signs of withdrawal x4 days this admission. Other possible etiologies for seizure  are hypoglycemia (less likely with post ictal CBG 108), infectious process given admission for septic R knee, or intracranial process. 1. Transfer to step down 2. Consulted neurology - will get MRI brain non contrast, EEG 3. CBG 108 4. Will check Mag, CMP, CBC 5. Notified intensive care of this patient    [name], MD 01/18/2016, 11:43 PM PGY-1, [facility] pager [phone]

## 2016-01-19 ENCOUNTER — Inpatient Hospital Stay (HOSPITAL_COMMUNITY): Payer: 59

## 2016-01-19 DIAGNOSIS — R569 Unspecified convulsions: Secondary | ICD-10-CM

## 2016-01-19 DIAGNOSIS — Z96659 Presence of unspecified artificial knee joint: Secondary | ICD-10-CM

## 2016-01-19 DIAGNOSIS — T8459XD Infection and inflammatory reaction due to other internal joint prosthesis, subsequent encounter: Secondary | ICD-10-CM

## 2016-01-19 DIAGNOSIS — F1990 Other psychoactive substance use, unspecified, uncomplicated: Secondary | ICD-10-CM

## 2016-01-19 LAB — COMPREHENSIVE METABOLIC PANEL WITH GFR
ALT: <5 U/L — ABNORMAL LOW (ref 17–63)
AST: 27 U/L (ref 15–41)
Albumin: 2.4 g/dL — ABNORMAL LOW (ref 3.5–5.0)
Alkaline Phosphatase: 123 U/L (ref 38–126)
Anion gap: 7 (ref 5–15)
BUN: 9 mg/dL (ref 6–20)
CO2: 24 mmol/L (ref 22–32)
Calcium: 8.5 mg/dL — ABNORMAL LOW (ref 8.9–10.3)
Chloride: 105 mmol/L (ref 101–111)
Creatinine, Ser: 1.57 mg/dL — ABNORMAL HIGH (ref 0.61–1.24)
GFR calc Af Amer: 52 mL/min — ABNORMAL LOW
GFR calc non Af Amer: 45 mL/min — ABNORMAL LOW
Glucose, Bld: 105 mg/dL — ABNORMAL HIGH (ref 65–99)
Potassium: 3.8 mmol/L (ref 3.5–5.1)
Sodium: 136 mmol/L (ref 135–145)
Total Bilirubin: 0.3 mg/dL (ref 0.3–1.2)
Total Protein: 7.5 g/dL (ref 6.5–8.1)

## 2016-01-19 LAB — AEROBIC/ANAEROBIC CULTURE (SURGICAL/DEEP WOUND)

## 2016-01-19 LAB — BASIC METABOLIC PANEL WITH GFR
Anion gap: 8 (ref 5–15)
BUN: 8 mg/dL (ref 6–20)
CO2: 25 mmol/L (ref 22–32)
Calcium: 8.6 mg/dL — ABNORMAL LOW (ref 8.9–10.3)
Chloride: 105 mmol/L (ref 101–111)
Creatinine, Ser: 1.52 mg/dL — ABNORMAL HIGH (ref 0.61–1.24)
GFR calc Af Amer: 55 mL/min — ABNORMAL LOW
GFR calc non Af Amer: 47 mL/min — ABNORMAL LOW
Glucose, Bld: 99 mg/dL (ref 65–99)
Potassium: 3.8 mmol/L (ref 3.5–5.1)
Sodium: 138 mmol/L (ref 135–145)

## 2016-01-19 LAB — CK: CK TOTAL: 56 U/L (ref 49–397)

## 2016-01-19 LAB — CBC
HCT: 38 % — ABNORMAL LOW (ref 39.0–52.0)
HCT: 37.1 % — ABNORMAL LOW (ref 39.0–52.0)
HEMOGLOBIN: 13.1 g/dL (ref 13.0–17.0)
Hemoglobin: 12.5 g/dL — ABNORMAL LOW (ref 13.0–17.0)
MCH: 32.6 pg (ref 26.0–34.0)
MCH: 32.1 pg (ref 26.0–34.0)
MCHC: 34.5 g/dL (ref 30.0–36.0)
MCHC: 33.7 g/dL (ref 30.0–36.0)
MCV: 94.5 fL (ref 78.0–100.0)
MCV: 95.1 fL (ref 78.0–100.0)
PLATELETS: 330 10*3/uL (ref 150–400)
Platelets: 345 K/uL (ref 150–400)
RBC: 4.02 MIL/uL — AB (ref 4.22–5.81)
RBC: 3.9 MIL/uL — ABNORMAL LOW (ref 4.22–5.81)
RDW: 14 % (ref 11.5–15.5)
RDW: 14.3 % (ref 11.5–15.5)
WBC: 9.2 10*3/uL (ref 4.0–10.5)
WBC: 9.5 K/uL (ref 4.0–10.5)

## 2016-01-19 LAB — CULTURE, BLOOD (ROUTINE X 2)
Culture: NO GROWTH
Culture: NO GROWTH

## 2016-01-19 LAB — AEROBIC/ANAEROBIC CULTURE W GRAM STAIN (SURGICAL/DEEP WOUND)

## 2016-01-19 LAB — MAGNESIUM: Magnesium: 1.8 mg/dL (ref 1.7–2.4)

## 2016-01-19 MED ORDER — SODIUM CHLORIDE 0.9 % IV SOLN
700.0000 mg | INTRAVENOUS | Status: DC
  Administered 2016-01-19 – 2016-01-24 (×6): 700 mg via INTRAVENOUS
  Filled 2016-01-19 (×9): qty 14

## 2016-01-19 NOTE — Consult Note (Signed)
Neurology Consultation Reason for Consult: Seizure Referring Physician: Gladstone Lighter, R  CC: Seizure  History is obtained from: Patient, chart  HPI: Juan Wiggins is a 65 y.o. male admitted with a septic right knee who presents with new onset seizure. It was described as a tonic-clonic seizure. Patient is amnestic to the event. He was incontinent. He has since returned to baseline.  He denies any previous history of seizure. He denies any head trauma causing  Loss of consciousness. He denies staring spells, lost time.   ROS: A 14 point ROS was performed and is negative except as noted in the HPI.   Past Medical History:  Diagnosis Date  . Anxiety   . Arthritis    RHEUMATOID ARTHRITIS; OA LEFT KNEE  . Cancer (HCC)    MELANOMA REMOVED RT SHOULDER  . Depression   . GERD (gastroesophageal reflux disease)    PREVACID IF NEEDED  . Heart murmur   . Hemorrhoids   . Hepatitis A    PT STATES TYPE OF HEPATITIS YOU GET FROM SHELLFISH  . Hepatitis B   . Hepatitis C   . Hypertension   . Hypothyroidism   . 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 Pancreatitis, alcoholic, acute   . Septic arthritis ([place]) 01/14/2016   RT KNEE  . Sleep apnea    CLAUSTROPHOBIC - COULD NOT TOLERATE CPAP MASK  . Tachycardia    PT STATES HIS HEART RATE USUALLY 100 OR MORE  . Thyroid disease      Family History  Problem Relation Age of Onset  . Hypertension Mother   . Other Mother     varicose veins  . Hypertension Sister      Social History:  reports that he quit smoking about 17 years ago. His smoking use included Cigars. He has a 52.50 pack-year smoking history. He has never used smokeless tobacco. He reports that he uses drugs, including Cocaine. He reports that he does not drink alcohol.   Exam: Current vital signs: BP (!) 169/79   Pulse (!) 113   Temp 97.9 F (36.6 C) (Oral)   Resp 15   Ht 5' 11.5" (1.816 m)   Wt 83.5 kg (184 lb)   SpO2 98%   BMI  25.31 kg/m  Vital signs in last 24 hours: Temp:  [97.9 F (36.6 C)-98.9 F (37.2 C)] 97.9 F (36.6 C) (11/25 2313) Pulse Rate:  [53-113] 113 (11/25 2313) Resp:  [15-18] 15 (11/25 2050) BP: (147-169)/(62-79) 169/79 (11/25 2313) SpO2:  [96 %-100 %] 98 % (11/25 2353)   Physical Exam  Constitutional: Appears well-developed and well-nourished.  Psych: Affect appropriate to situation Eyes: No scleral injection HENT: No OP obstrucion, Neck is supple Head: Normocephalic.  Cardiovascular: Normal rate and regular rhythm.  Respiratory: Effort normal and breath sounds normal to anterior ascultation GI: Soft.  No distension. There is no tenderness.  Skin: WDI  Neuro: Mental Status: Patient is awake, alert, oriented to person, place, month, year, and situation. Patient is able to give a clear and coherent history. No signs of aphasia or neglect Cranial Nerves: II: Visual Fields are full. Pupils are equal, round, and reactive to light.   III,IV, VI: EOMI without ptosis or diploplia.  V: Facial sensation is symmetric to temperature VII: Facial movement is symmetric.  VIII: hearing is intact to voice X: Uvula elevates symmetrically XI: Shoulder shrug is symmetric. XII: tongue is midline without atrophy or fasciculations.  Motor: Tone is normal. Bulk is normal. 5/5 strength was present in all four extremities.  Sensory: Sensation is symmetric to light touch and temperature in the arms and legs. Deep Tendon Reflexes: 2+ and symmetric in the biceps and patellae.  Plantars: Toes are downgoing bilaterally.  Cerebellar: FNF  intact bilaterally      I have reviewed labs in epic and the results pertinent to this consultation are: Creatinine 1.57  I have reviewed the images obtained:  MRI brain-no clear cause of seizure   Impression: 65 year old male with new onset seizure. Ancef(Indeed all cephalosporins) can certainly lower seizure  Threshold, particularly in the setting of renal  dysfunction. This is not definitely the cause, however. I would consider this an unprovoked seizure  Recommendations: 1) Consider change in antibiotic, would Alsoavoid fluoroquinolones 2) EEG 3) Patient is unable to drive, operate heavy machinery, perform activities at heights or participate in water activities until release by outpatient physician. This was discussed with the patient who expressed understanding.  4) no aed unless EEG is positive or further events occur.   Roland Rack, MD [facility] [phone]  If 7pm- 7am, please page neurology on call as listed in [place].

## 2016-01-19 NOTE — Progress Notes (Signed)
Pt transported to MRI 

## 2016-01-19 NOTE — Progress Notes (Signed)
Family Medicine Teaching Service Daily Progress Note Intern Pager: [phone]  Patient name: Juan Wiggins Medical record number: [id number] Date of birth: September 30, 1950 Age: 65 y.o. Gender: male  Primary Care Provider: Helane Rima, MD Consultants:  Ortho, ID Code Status: Full  Pt Overview and Major Events to Date:  11/22 - Admitted for R septic knee (re-admission), joint aspiration by ortho 11/23 - Surgical I&D R knee by orthopedics  Assessment and Plan: Juan Schorsch Jonesis a 65 y.o.malepresenting with right knee pain presenting with septic joint, readmit, has been off of antibiotics 7 days. PMH is significant for hypothyroidism, alcohol abuse, Hepatitis B, Hepatitis C, HTN, GERD, chronic pain syndrome, hx of TKA (both in 2015), hx of R septic knee (06/2015), major depression, anxiety, BPH.   # Unprovoked Seizure, one occurrence -  Patient had witnessed tonic-clonic seizure-like activity 11/26. No history of seizures per patient and chart review. Differential includes substance withdrawal, though no clinical signs of withdrawal so far this admission, infectious etiology though not meeting sepsis criteria.  Considered hypoglycemia (ruled out with postictal CBG 108), or intracranial process (MRI brain WNL). Iatrogenic causes considered, per neurology cephalosporins may lower the seizure threshold with AKI (Cr 1.52 this AM) so we can consider transitioning antibiotics.  This is not a clear cause of the seizure, and per neuro this is considered an unprovoked seizure.  - continue to monitor in step down - EEG pending - appreciate neurology recs - consider transition antibiotics today, d/w ID  # Right knee septic joint. s/p arthroscopy with irrigation and debridement x1, was last admitted 11/5 - 11/13, discharged with plans for home ceftriaxone IM presented s/p 7d without antibiotics. Leukocytosis resolved. Joint cultures from aspirate  growing few staph aureus. - continue Ancef 2g q8h (11/21 > ),  consider transition today as noted above - Synovial cultures pan-sensitive staph aureus - ID advocates for removal of prosthesis and placement of antibiotic spacer followed by parenteral therapy. Options per ID include IV ortavancin followed by Dalbavancin followed by PO, vs using Linezolid - appreciate ID recs - f/u bcx (NGTD), arthoscopy culture - follow-up ortho recs - pain control with dilaudid 1mg  q2hr and vicoden q4hr prn.  - PT/OT  # AKI Cr 2.36 >>>1.52  (baseline 1.16). May be prerenal given recent immobility/possible poor hydration.  - will recheck BMP tomorrow AM - avoid nephrotoxic agents  # RLQ pain. Patient denies urinary urgency or frequency, has had normal BMs, pain is in sacral ligament radiating to groin.  No hematuria, no anorexia. No peritoneal signs or fevers, so do not think this is appendicitis. +2 episodes non-bloody diarrhea.   - will monitor - pain maybe radiation of right leg pain given infection - consider pelvic/scrotol [place]   # history of substance abuse/UDS positive for cocaine/history of EtOH use. - patient denies alcohol or drug use at this time. History of alcohol use, he states 2 years ago, however was treated 1 year ago per chart review. +cocaine on his three most recent urine drug screens, though patient denies use. - repeat UDS with only benzos and opiods - monitor on CIWA, scores 0,4 overnight for tremor, anxiety, agitation and tactile disturbances  # Hypertension. BP elevated to 146/53 on admission. Home regimen previously included propanolol which was discontinued at previous admission due to cocaine use.  Titrated amlodipine to 10 mg qd. Pressures still poorly controlled 164/82 this morning, patient is post-ictal. - continue 10 mg amlodipine daily - monitor BP - hydralazine prn for elevated pressures  #  Hepatitis B, chronic and active.At home on Viread 300 mg daily.  - Continue home Viread 300 mg dosed by pharmacy every other day due to Cr  Cl - Monitor  #Hepatitis C, chronic and active. At home on Sofosbuvir-Velpatasvir [name]).  - Continue home Epclusa 400-100 mg po tab daily  - Monitor  #Hypothyroidism. Stable. At home on Levothyroxine. Previously TSH 0.20 on last admission with 200 mcg synthroid daily however dose not changed given acute illness.  Repeat this admission TSH 6.303, fT4 WNL at 1.07.   - Continue home Synthroid 200 mcg daily - will need recheck TSH in 6 weeks   #difficulty sleeping. At home on Ambien 10 mg at bedtime.  - Ambien 5 mg PRN  #Depression . Stable. At home on Lexapro and Remeron. Also on Ambien 10mg  PRN  -Continue Lexapro 20 mg daily at bedtime   #BPH:On Silodosin 8mg  daily at home - flomax 0.4 mg Qd while hospitalized  #Chronic lumbar spine pain - stable. No neurologic deficits. - monitor  # ?Asthma - not indicated on problem list however albuterol on med list. - PRN albuterol  - Will follow up whether pt uses this  FEN/GI: KVO, Regular diet  Disposition: Awaiting further work-up. Plan to discharge to SNF with PICC if patient consents   Subjective:  Overnight patient had 20s of seizure activity not requiring medication, underwent brain MRI and was seen by neurology. No repeat episodes.  Patient tired this morning consistent with post-ictal state, does not remember seizing last night, does not remember going for his MRI.  He denies pain, denies N/V/D/C.  Notes he got up a few times to urinate and urinated on the floor.  Objective: Temp:  [97.9 F (36.6 C)-99.1 F (37.3 C)] 99 F (37.2 C) (11/26 0721) Pulse Rate:  [58-113] 74 (11/26 0721) Resp:  [15-22] 15 (11/26 0721) BP: (147-169)/(70-82) 164/82 (11/26 0310) SpO2:  [94 %-100 %] 97 % (11/26 0721) Physical Exam: General: NAD, lies comfortably in bed, sleepy Cardiovascular: RRR, no m/r/g Respiratory: CTA bil, no W/R/R Abdomen: soft, nt, nd, normoactive BS Extremities:+right knee wrapped in ACE bandage,2+ DP  pulses bilaterally Neuro: CN II-XII  intact  Laboratory:  Recent Labs Lab 01/18/16 0437 01/19/16 0137 01/19/16 0358  WBC 8.9 9.2 9.5  HGB 12.4* 13.1 12.5*  HCT 36.6* 38.0* 37.1*  PLT 363 330 345    Recent Labs Lab 01/17/16 0640 01/19/16 0137 01/19/16 0358  NA 138 136 138  K 4.1 3.8 3.8  CL 108 105 105  CO2 24 24 25   BUN 13 9 8   CREATININE 2.03* 1.57* 1.52*  CALCIUM 8.3* 8.5* 8.6*  PROT  --  7.5  --   BILITOT  --  0.3  --   ALKPHOS  --  123  --   ALT  --  <5*  --   AST  --  27  --   GLUCOSE 99 105* 99    Imaging/Diagnostic Tests: Dg Knee 2 Views Right  Result Date: 01/14/2016 IMPRESSION: RIGHT knee prosthesis without acute complication. Soft tissue swelling RIGHT knee. Electronically Signed   By: Lavonia Dana M.D.   On: 01/14/2016 12:39    [name], MD 01/19/2016, 8:17 AM PGY-1, [place] Intern pager: [phone], text pages welcome

## 2016-01-19 NOTE — Progress Notes (Signed)
Report attempted to [place]. RN will call back to get report.

## 2016-01-19 NOTE — Progress Notes (Signed)
Pharmacy Antibiotic Note  Juan Wiggins is a 65 y.o. male admitted on 01/14/2016 with septic arthritis of the right knee. Patient was recently discharge on 11/13 and when 7 days without antibiotics due to not being able to afford the medication. Joint was previously growing MSSA.  Patient experienced witnessed tonic-clonic seizure thought to be due to cefazolin in setting of AKI and pharmacy consulted to start daptomycin. Dr. Tommy Medal suggests changing to dapto while inpatient and transition to oritavancin/dalbavancin outpt.   Will dose ~8mg /kg due to systemic infection.  Afebrile, wbc wnl. Renal function improving (CrCl ~54 mL/min). Baseline CK ordered.   Plan: Daptomycin 700mg  q24h Monitor baseline and weekly CK Monitor s/sx of infection and plan for length of therapy Monitor renal function  Height: 5' 11.5" (181.6 cm) Weight: 184 lb (83.5 kg) IBW/kg (Calculated) : 76.45  Temp (24hrs), Avg:98.7 F (37.1 C), Min:97.9 F (36.6 C), Max:99.1 F (37.3 C)   Recent Labs Lab 01/15/16 0920 01/16/16 0503 01/17/16 0640 01/18/16 0437 01/19/16 0137 01/19/16 0358  WBC 11.7* 9.1 8.0 8.9 9.2 9.5  CREATININE 2.06* 2.14* 2.03*  --  1.57* 1.52*    Estimated Creatinine Clearance: 53.8 mL/min (by C-G formula based on SCr of 1.52 mg/dL (H)).    Allergies  Allergen Reactions  . Heparin Other (See Comments)    Low platelets (130s), no HIT testing performed, platelets recovered.  . Wellbutrin [Bupropion] Other (See Comments)    Hallucinations-started on 150mg  daily dosage    Antimicrobials this admission: 11/21 ancef >>>11/26 11/26 dapto >>>  Microbiology results: 11/5 MRSA PCR: Neg 11/5 right knee synovial fluid cx: Few MSSA 11/21 BCx: NGTD  11/21 right knee synovial fluid: Few Staph aureus   Thank you for allowing pharmacy to be a part of this patient's care.  Dierdre Harness, PharmD PGY1 Pharmacy Resident Pager: [phone] 01/19/2016 10:54 AM

## 2016-01-19 NOTE — Progress Notes (Signed)
Physical Therapy Treatment Patient Details Name: Juan Wiggins MRN: [id number] DOB: Jun 25, 1950 Today's Date: 01/19/2016    History of Present Illness 65 y.o. male presenting with right knee pain presenting with septic joint, readmit, has been off of antibiotics 8 days. PMH is significant for hypothyroidism, alcohol abuse, Hepatitis B, Hepatitis C,  HTN, GERD, chronic pain syndrome, hx of TKA (both in 2015), hx of R septic knee (06/2015), major depression, anxiety, BPH    PT Comments    Progressing well with gait stability and there ex.  Follow Up Recommendations  Home health PT     Equipment Recommendations  None recommended by PT    Recommendations for Other Services       Precautions / Restrictions Precautions Precautions: None Restrictions Weight Bearing Restrictions: Yes RLE Weight Bearing: Weight bearing as tolerated    Mobility  Bed Mobility Overal bed mobility: Independent                Transfers Overall transfer level: Modified independent Equipment used: Quad cane Transfers: Sit to/from Stand Sit to Stand: Modified independent (Device/Increase time)         General transfer comment: increased time, good technique  Ambulation/Gait Ambulation/Gait assistance: Supervision Ambulation Distance (Feet): 15 Feet (140 feet with quad cane) Assistive device: Quad cane Gait Pattern/deviations: Step-to pattern;Step-through pattern   Gait velocity interpretation: Below normal speed for age/gender General Gait Details: antalgic gait with safe use of quad cane   Stairs            Wheelchair Mobility    Modified Rankin (Stroke Patients Only)       Balance     Sitting balance-Leahy Scale: Normal       Standing balance-Leahy Scale: Fair                      Cognition Arousal/Alertness: Awake/alert Behavior During Therapy: WFL for tasks assessed/performed Overall Cognitive Status: Within Functional Limits for tasks assessed                      Exercises Total Joint Exercises Quad Sets: AROM;10 reps;Both Straight Leg Raises: AROM;Right;10 reps;Supine Knee Flexion: AROM;Both;10 reps;Supine (graded resistance) Bridges: AROM;10 reps;Supine    General Comments        Pertinent Vitals/Pain Pain Assessment: 0-10 Pain Score: 7  Pain Location: R knee Pain Descriptors / Indicators: Aching;Numbness;Sore Pain Intervention(s): Monitored during session;Repositioned    Home Living                      Prior Function            PT Goals (current goals can now be found in the care plan section) Acute Rehab PT Goals Patient Stated Goal: get better PT Goal Formulation: With patient Time For Goal Achievement: 01/21/16 Potential to Achieve Goals: Good Progress towards PT goals: Progressing toward goals    Frequency    Min 3X/week      PT Plan Current plan remains appropriate    Co-evaluation             End of Session   Activity Tolerance: Patient tolerated treatment well Patient left: with call bell/phone within reach;in chair     Time:  (1153)-1220    Charges:  $Gait Training: 8-22 mins $Therapeutic Exercise: 8-22 mins                    G Codes:  Tessie Fass Verdun Rackley 01/19/2016, 12:29 PM 01/19/2016  Donnella Sham, [place] [phone]  (pager)

## 2016-01-19 NOTE — Progress Notes (Signed)
Pt has new order for cardiac monitor, non available on the floor, pt has transfer orders placed, will be transferred to [facility] soon. MD notified.

## 2016-01-19 NOTE — Progress Notes (Signed)
   Subjective:  Patient reports pain as mild to moderate.  No c/o.  Objective:   VITALS:   Vitals:   01/18/16 2353 01/19/16 0310 01/19/16 0315 01/19/16 0721  BP:  (!) 164/82    Pulse:  75 75 74  Resp:  (!) 22 19 15   Temp:  99.1 F (37.3 C)  99 F (37.2 C)  TempSrc:  Oral  Oral  SpO2: 98% 94% 94% 97%  Weight:      Height:        NAD ABD soft Sensation intact distally Intact pulses distally Dorsiflexion/Plantar flexion intact Incision: dressing C/D/I Compartment soft Portals c/d/i   Lab Results  Component Value Date   WBC 9.5 01/19/2016   HGB 12.5 (L) 01/19/2016   HCT 37.1 (L) 01/19/2016   MCV 95.1 01/19/2016   PLT 345 01/19/2016   BMET    Component Value Date/Time   NA 138 01/19/2016 0358   K 3.8 01/19/2016 0358   CL 105 01/19/2016 0358   CO2 25 01/19/2016 0358   GLUCOSE 99 01/19/2016 0358   BUN 8 01/19/2016 0358   CREATININE 1.52 (H) 01/19/2016 0358   CREATININE 1.38 (H) 12/16/2015 1443   CALCIUM 8.6 (L) 01/19/2016 0358   GFRNONAA 47 (L) 01/19/2016 0358   GFRNONAA 54 (L) 12/16/2015 1443   GFRAA 55 (L) 01/19/2016 0358   GFRAA 62 12/16/2015 1443     Assessment/Plan: 4 Days Post-Op   Active Problems:   Infection of total knee replacement (HCC)   Septic arthritis (HCC)   Staphylococcal arthritis of right knee ([place])   IVDU (intravenous drug user)   Seizure ([employer])   Cont current care IV abx per ID Dr. Gladstone Lighter planning for surgery later this week    Chantell Kunkler, Zarak Caton 01/19/2016, 8:07 AM   Rod Can, MD Cell [phone]

## 2016-01-19 NOTE — Progress Notes (Signed)
Subjective:  Patient suffered 2 seizures last night including one in which he lost consciousness and bit his tongue   Antibiotics:  Anti-infectives    Start     Dose/Rate Route Frequency Ordered Stop   01/19/16 1100  DAPTOmycin (CUBICIN) 700 mg in sodium chloride 0.9 % IVPB     700 mg 228 mL/hr over 30 Minutes Intravenous Every 24 hours 01/19/16 1057     01/17/16 0900  tenofovir (VIREAD) tablet 300 mg     300 mg Oral Every 48 hours 01/15/16 1330     01/16/16 1700  ceFAZolin (ANCEF) IVPB 2g/100 mL premix  Status:  Discontinued     2 g 200 mL/hr over 30 Minutes Intravenous Every 8 hours 01/16/16 1433 01/19/16 0826   01/15/16 1930  ceFAZolin (ANCEF) IVPB 1 g/50 mL premix  Status:  Discontinued     1 g 100 mL/hr over 30 Minutes Intravenous Every 6 hours 01/15/16 1923 01/15/16 2042   01/15/16 1930  ceFAZolin (ANCEF) IVPB 1 g/50 mL premix  Status:  Discontinued     1 g 100 mL/hr over 30 Minutes Intravenous Every 6 hours 01/15/16 1923 01/15/16 1932   01/15/16 1800  ceFAZolin (ANCEF) IVPB 2g/100 mL premix  Status:  Discontinued     2 g 200 mL/hr over 30 Minutes Intravenous Every 8 hours 01/15/16 1344 01/16/16 1412   01/15/16 1000  tenofovir (VIREAD) tablet 300 mg  Status:  Discontinued     300 mg Oral Daily 01/14/16 1539 01/15/16 1330   01/14/16 2200  ceFAZolin (ANCEF) IVPB 2g/100 mL premix  Status:  Discontinued     2 g 200 mL/hr over 30 Minutes Intravenous Every 12 hours 01/14/16 1546 01/15/16 1344   01/14/16 1545  Sofosbuvir-Velpatasvir 400-100 MG TABS 1 tablet     1 tablet Oral Daily 01/14/16 1539     01/14/16 1400  ceFAZolin (ANCEF) IVPB 2g/100 mL premix  Status:  Discontinued     2 g 200 mL/hr over 30 Minutes Intravenous Every 8 hours 01/14/16 1154 01/14/16 1546      Medications: Scheduled Meds: . amLODipine  10 mg Oral Daily  . aspirin EC  325 mg Oral Q breakfast  . DAPTOmycin (CUBICIN)  IV  700 mg Intravenous Q24H  . escitalopram  20 mg Oral Daily  . ferrous  sulfate  325 mg Oral TID PC  . ketotifen  1 drop Both Eyes BID  . levothyroxine  200 mcg Oral QAC breakfast  . Sofosbuvir-Velpatasvir  1 tablet Oral Daily  . tamsulosin  0.4 mg Oral QPC breakfast  . tenofovir  300 mg Oral Q48H   Continuous Infusions: . lactated ringers    . lactated ringers     PRN Meds:.acetaminophen **OR** acetaminophen, albuterol, bisacodyl, hydrALAZINE, HYDROcodone-acetaminophen, HYDROmorphone (DILAUDID) injection, hydrOXYzine, LORazepam, menthol-cetylpyridinium **OR** phenol, methocarbamol **OR** methocarbamol (ROBAXIN)  IV, methocarbamol, metoCLOPramide **OR** metoCLOPramide (REGLAN) injection, ondansetron **OR** ondansetron (ZOFRAN) IV, polyethylene glycol, zolpidem    Objective: Weight change:   Intake/Output Summary (Last 24 hours) at 01/19/16 1345 Last data filed at 01/19/16 0900  Gross per 24 hour  Intake              200 ml  Output              275 ml  Net              -75 ml   Blood pressure (!) 144/78, pulse 77, temperature 99.2 F (37.3 C), temperature source  Oral, resp. rate 12, height 5' 11.5" (1.816 m), weight 184 lb (83.5 kg), SpO2 98 %. Temp:  [97.9 F (36.6 C)-99.2 F (37.3 C)] 99.2 F (37.3 C) (11/26 1217) Pulse Rate:  [58-113] 77 (11/26 1217) Resp:  [12-22] 12 (11/26 1217) BP: (144-169)/(70-82) 144/78 (11/26 1217) SpO2:  [94 %-100 %] 98 % (11/26 1217)  Physical Exam: General: Alert and awake, oriented x3, not in any acute distress. HEENT: anicteric sclera,  EOMI, oropharynx clear and without exudate Cardiovascular: egular rate, normal r,  no murmur rubs or gallops Pulmonary: clear to auscultation bilaterally, no wheezing, rales or rhonchi Gastrointestinal: soft nontender, nondistended, normal bowel sounds, Musculoskeletal: knee wrapped in bandage Skin, soft tissue: no rashes Neuro: nonfocal, strength and sensation intact   CBC: CBC Latest Ref Rng & Units 01/19/2016 01/19/2016 01/18/2016  WBC 4.0 - 10.5 K/uL 9.5 9.2 8.9    Hemoglobin 13.0 - 17.0 g/dL 12.5(L) 13.1 12.4(L)  Hematocrit 39.0 - 52.0 % 37.1(L) 38.0(L) 36.6(L)  Platelets 150 - 400 K/uL 345 330 363      BMET  Recent Labs  01/19/16 0137 01/19/16 0358  NA 136 138  K 3.8 3.8  CL 105 105  CO2 24 25  GLUCOSE 105* 99  BUN 9 8  CREATININE 1.57* 1.52*  CALCIUM 8.5* 8.6*     Liver Panel   Recent Labs  01/19/16 0137  PROT 7.5  ALBUMIN 2.4*  AST 27  ALT <5*  ALKPHOS 123  BILITOT 0.3       Sedimentation Rate No results for input(s): ESRSEDRATE in the last 72 hours. C-Reactive Protein No results for input(s): CRP in the last 72 hours.  Micro Results: Recent Results (from the past 720 hour(s))  Blood culture (routine x 2)     Status: None   Collection Time: 12/29/15 11:30 AM  Result Value Ref Range Status   Specimen Description BLOOD LEFT ARM  Final   Special Requests BOTTLES DRAWN AEROBIC AND ANAEROBIC 5CC  Final   Culture NO GROWTH 5 DAYS  Final   Report Status 01/03/2016 FINAL  Final  Blood culture (routine x 2)     Status: None   Collection Time: 12/29/15 11:36 AM  Result Value Ref Range Status   Specimen Description BLOOD RIGHT ARM  Final   Special Requests BOTTLES DRAWN AEROBIC AND ANAEROBIC 5CC  Final   Culture NO GROWTH 5 DAYS  Final   Report Status 01/03/2016 FINAL  Final  Body fluid culture     Status: None   Collection Time: 12/29/15 12:30 PM  Result Value Ref Range Status   Specimen Description SYNOVIAL RIGHT KNEE  Final   Special Requests NONE  Final   Gram Stain   Final    ABUNDANT WBC PRESENT, PREDOMINANTLY PMN RARE GRAM POSITIVE COCCI IN CLUSTERS    Culture FEW STAPHYLOCOCCUS AUREUS  Final   Report Status 12/31/2015 FINAL  Final   Organism ID, Bacteria STAPHYLOCOCCUS AUREUS  Final      Susceptibility   Staphylococcus aureus - MIC*    CIPROFLOXACIN <=0.5 SENSITIVE Sensitive     ERYTHROMYCIN <=0.25 SENSITIVE Sensitive     GENTAMICIN <=0.5 SENSITIVE Sensitive     OXACILLIN 0.5 SENSITIVE Sensitive      TETRACYCLINE <=1 SENSITIVE Sensitive     VANCOMYCIN <=0.5 SENSITIVE Sensitive     TRIMETH/SULFA <=10 SENSITIVE Sensitive     CLINDAMYCIN <=0.25 SENSITIVE Sensitive     RIFAMPIN <=0.5 SENSITIVE Sensitive     Inducible Clindamycin NEGATIVE Sensitive     *  FEW STAPHYLOCOCCUS AUREUS  Urine culture     Status: Abnormal   Collection Time: 12/29/15  1:15 PM  Result Value Ref Range Status   Specimen Description URINE, RANDOM  Final   Special Requests NONE  Final   Culture <10,000 COLONIES/mL INSIGNIFICANT GROWTH (A)  Final   Report Status 12/30/2015 FINAL  Final  MRSA PCR Screening     Status: None   Collection Time: 12/29/15  4:00 PM  Result Value Ref Range Status   MRSA by PCR NEGATIVE NEGATIVE Final    Comment:        The GeneXpert MRSA Assay (FDA approved for NASAL specimens only), is one component of a comprehensive MRSA colonization surveillance program. It is not intended to diagnose MRSA infection nor to guide or monitor treatment for MRSA infections.   Aerobic/Anaerobic Culture (surgical/deep wound)     Status: None   Collection Time: 01/02/16  2:05 PM  Result Value Ref Range Status   Specimen Description FLUID RIGHT KNEE  Final   Special Requests SWAB  Final   Gram Stain   Final    FEW WBC PRESENT, PREDOMINANTLY PMN NO ORGANISMS SEEN    Culture No growth aerobically or anaerobically.  Final   Report Status 01/07/2016 FINAL  Final  Blood culture (routine x 2)     Status: None   Collection Time: 01/14/16 11:53 AM  Result Value Ref Range Status   Specimen Description BLOOD LEFT ARM  Final   Special Requests BOTTLES DRAWN AEROBIC AND ANAEROBIC 5CC  Final   Culture NO GROWTH 5 DAYS  Final   Report Status 01/19/2016 FINAL  Final  Blood culture (routine x 2)     Status: None   Collection Time: 01/14/16 12:15 PM  Result Value Ref Range Status   Specimen Description BLOOD RIGHT ARM  Final   Special Requests BOTTLES DRAWN AEROBIC AND ANAEROBIC 5CC  Final   Culture NO  GROWTH 5 DAYS  Final   Report Status 01/19/2016 FINAL  Final  Aerobic/Anaerobic Culture (surgical/deep wound)     Status: None (Preliminary result)   Collection Time: 01/14/16  6:44 PM  Result Value Ref Range Status   Specimen Description FLUID SYNOVIAL RIGHT KNEE  Final   Special Requests NONE  Final   Gram Stain   Final    MODERATE WBC PRESENT, PREDOMINANTLY PMN NO ORGANISMS SEEN    Culture   Final    FEW STAPHYLOCOCCUS AUREUS CRITICAL RESULT CALLED TO, READ BACK BY AND VERIFIED WITH: C. HANCOCK RN, AT 1202 01/16/16 BY D. VANHOOK REGARDING GRAM STAIN NO ANAEROBES ISOLATED; CULTURE IN PROGRESS FOR 5 DAYS    Report Status PENDING  Incomplete   Organism ID, Bacteria STAPHYLOCOCCUS AUREUS  Final      Susceptibility   Staphylococcus aureus - MIC*    CIPROFLOXACIN <=0.5 SENSITIVE Sensitive     ERYTHROMYCIN <=0.25 SENSITIVE Sensitive     GENTAMICIN <=0.5 SENSITIVE Sensitive     OXACILLIN <=0.25 SENSITIVE Sensitive     TETRACYCLINE <=1 SENSITIVE Sensitive     VANCOMYCIN 1 SENSITIVE Sensitive     TRIMETH/SULFA <=10 SENSITIVE Sensitive     CLINDAMYCIN <=0.25 SENSITIVE Sensitive     RIFAMPIN <=0.5 SENSITIVE Sensitive     Inducible Clindamycin NEGATIVE Sensitive     * FEW STAPHYLOCOCCUS AUREUS  Surgical pcr screen     Status: None   Collection Time: 01/15/16 12:09 AM  Result Value Ref Range Status   MRSA, PCR NEGATIVE NEGATIVE Final   Staphylococcus  aureus NEGATIVE NEGATIVE Final    Comment:        The Xpert SA Assay (FDA approved for NASAL specimens in patients over 76 years of age), is one component of a comprehensive surveillance program.  Test performance has been validated by [facility] for patients greater than or equal to 55 year old. It is not intended to diagnose infection nor to guide or monitor treatment.   Body fluid culture     Status: None   Collection Time: 01/15/16  4:37 PM  Result Value Ref Range Status   Specimen Description SYNOVIAL RIGHT KNEE  Final    Special Requests FLUID ON SWAB PATIENT ON FOLLOWING ANCEF  Final   Gram Stain   Final    ABUNDANT WBC PRESENT, PREDOMINANTLY PMN NO ORGANISMS SEEN    Culture   Final    RARE STAPHYLOCOCCUS AUREUS CRITICAL VALUE NOTED.  VALUE IS CONSISTENT WITH PREVIOUSLY REPORTED AND CALLED VALUE.    Report Status 01/18/2016 FINAL  Final    Studies/Results: Mr Brain Wo Contrast  Result Date: 01/19/2016 CLINICAL DATA:  65 y/o  M; seizure. EXAM: MRI HEAD WITHOUT CONTRAST TECHNIQUE: Multiplanar, multiecho pulse sequences of the brain and surrounding structures were obtained without intravenous contrast. COMPARISON:  10/20/2013 CT head. FINDINGS: Mild motion degradation of all sequences. Brain: Cerebellar tonsils extend 4.7 mm below the foramen magnum. The tonsils are not pointed and there is no significant crowding of the posterior fossa. No diffusion signal abnormality. No significant T2 FLAIR signal abnormality. Mild diffuse brain parenchymal volume loss. Midline structures are intact including a complete vermis, corpus callosum, and morphologically normal pituitary gland. No gross disorder cortical formation or heterotopia. No abnormal susceptibility hypointensity to indicate intracranial hemorrhage. Hippocampi by are symmetric in size and signal bilaterally. Vascular: Normal flow voids. Skull and upper cervical spine: Normal marrow signal. Sinuses/Orbits: Mild mucosal thickening of maxillary sinuses and ethmoid air cells. Otherwise the visualized paranasal sinuses and mastoid air cells are unremarkable. No abnormal signal of the orbits. Other: 14 mm left paramedian vertex scalp lipoma. IMPRESSION: 1. Mild motion degradation of all sequences. 2. No structural cause of seizure identified. 3. No acute intracranial abnormality. 4. Low-lying cerebellar tonsils. Morphology is not suggestive of Chiari malformation, likely normal variant. 5. Mild brain parenchymal volume loss. 6. Mild paranasal sinus disease. Electronically  Signed   By: Kristine Garbe M.D.   On: 01/19/2016 01:54      Assessment/Plan:  INTERVAL HISTORY:   MSSA and SA S pending repeatedly growing.  Pt had  a seizure on cefazolin   Active Problems:   Infection of total knee replacement (HCC)   Septic arthritis (HCC)   Staphylococcal arthritis of right knee ([place])   IVDU (intravenous drug user)   Seizure [facility])    ACKLEY BRIXIUS is a 65 y.o. male with  65 y.o. 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 He has a history is highly suspicious for intravenous drug use with both hepatitis B and hepatitis C infections and tox screens positive for cocaine. He apparently did not receive parenteral therapy in the form of intramuscular or highly bioavailable therapy when he left the hospital most recently and was unwilling to go into a skilled nursing facility he is come back and had repeat I&D with now Staphylococcus aureus growing yet again. He had a seizure on cefazolin so we are changing to daptomycin for now  #1 Staphylococcus aureus prosthetic joint  infection:  Had an extensive conversation with Dr. Gladstone Lighter agrees that the best option to control this patient's infection would involve removal of his prosthetic joint and placement of antibiotic beads.  The patient having had a seizure on cefazolin and there being no other clear cut cause for his seizure I'm discontinuing this beta-lactam and placing him on daptomycin for now   Postoperatively as he stabilizes would then give him  long-acting antibiotic IV ORITAVANCIN stay in his system for 2 weeks at least followed by another long acting DALBAVANCIN, followed by PO vs using bioavailable PO option such as zyvox   hepatitis B he is on Viread every other day the prior discussion with regards to Taf   Hepatitis C continuing on therapy  Dr. Baxter Flattery is back tomorrow..   LOS: 5 days   Alcide Evener 01/19/2016, 1:45 PM

## 2016-01-20 ENCOUNTER — Inpatient Hospital Stay (HOSPITAL_COMMUNITY): Payer: 59

## 2016-01-20 ENCOUNTER — Ambulatory Visit (HOSPITAL_COMMUNITY): Payer: Self-pay | Admitting: Psychiatry

## 2016-01-20 LAB — CBC
HCT: 38.8 % — ABNORMAL LOW (ref 39.0–52.0)
Hemoglobin: 13.2 g/dL (ref 13.0–17.0)
MCH: 32.4 pg (ref 26.0–34.0)
MCHC: 34 g/dL (ref 30.0–36.0)
MCV: 95.1 fL (ref 78.0–100.0)
PLATELETS: 359 10*3/uL (ref 150–400)
RBC: 4.08 MIL/uL — AB (ref 4.22–5.81)
RDW: 14 % (ref 11.5–15.5)
WBC: 10.1 10*3/uL (ref 4.0–10.5)

## 2016-01-20 LAB — BASIC METABOLIC PANEL
Anion gap: 6 (ref 5–15)
BUN: 8 mg/dL (ref 6–20)
CALCIUM: 8.6 mg/dL — AB (ref 8.9–10.3)
CO2: 25 mmol/L (ref 22–32)
CREATININE: 1.47 mg/dL — AB (ref 0.61–1.24)
Chloride: 108 mmol/L (ref 101–111)
GFR calc Af Amer: 57 mL/min — ABNORMAL LOW (ref >60)
GFR, EST NON AFRICAN AMERICAN: 49 mL/min — AB (ref >60)
GLUCOSE: 108 mg/dL — AB (ref 65–99)
POTASSIUM: 4.2 mmol/L (ref 3.5–5.1)
SODIUM: 139 mmol/L (ref 135–145)

## 2016-01-20 MED ORDER — TENOFOVIR DISOPROXIL FUMARATE 300 MG PO TABS
300.0000 mg | ORAL_TABLET | Freq: Every day | ORAL | Status: DC
  Administered 2016-01-20 – 2016-01-24 (×5): 300 mg via ORAL
  Filled 2016-01-20 (×5): qty 1

## 2016-01-20 MED ORDER — HYDROMORPHONE HCL 1 MG/ML IJ SOLN
1.0000 mg | INTRAMUSCULAR | Status: DC | PRN
  Administered 2016-01-20 – 2016-01-21 (×4): 1 mg via INTRAVENOUS
  Filled 2016-01-20 (×4): qty 1

## 2016-01-20 NOTE — Progress Notes (Signed)
[facility] for Infectious Disease    Date of Admission:  01/14/2016   Total days of antibiotics 7        Day dapto 2        Day QOD tenofovir        Daily sofosbuvir-velpatasvir           ID: Juan Wiggins is a 65 y.o. 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 amLODipine  10 mg Oral Daily  . aspirin EC  325 mg Oral Q breakfast  . DAPTOmycin (CUBICIN)  IV  700 mg Intravenous Q24H  . escitalopram  20 mg Oral Daily  . ferrous sulfate  325 mg Oral TID PC  . ketotifen  1 drop Both Eyes BID  . levothyroxine  200 mcg Oral QAC breakfast  . tamsulosin  0.4 mg Oral QPC breakfast  . tenofovir  300 mg Oral Daily    Objective: Vital signs in last 24 hours: Temp:  [98.2 F (36.8 C)-99.2 F (37.3 C)] 98.9 F (37.2 C) (11/27 1125) Pulse Rate:  [62-89] 63 (11/27 1125) Resp:  [12-18] 13 (11/27 1125) BP: (136-149)/(72-98) 136/72 (11/27 1125) SpO2:  [93 %-99 %] 99 % (11/27 1125)  Did not examine today  Lab Results  Recent Labs  01/19/16 0358 01/20/16 0423  WBC 9.5 10.1  HGB 12.5* 13.2  HCT 37.1* 38.8*  NA 138 139  K 3.8 4.2  CL 105 108  CO2 25 25  BUN 8 8  CREATININE 1.52* 1.47*   Liver Panel  Recent Labs  01/19/16 0137  PROT 7.5  ALBUMIN 2.4*  AST 27  ALT <5*  ALKPHOS 123  BILITOT 0.3   Sedimentation Rate No results for input(s): ESRSEDRATE in the last 72 hours. C-Reactive Protein No results for input(s): CRP in the last 72 hours.  Microbiology: 11/21 aerobic cx  - staph aureus 11/22 synovial fluid cx - staph aureus Studies/Results: Mr Brain Wo Contrast  Result Date: 01/19/2016 CLINICAL DATA:  65 y/o  M; seizure. EXAM: MRI HEAD WITHOUT CONTRAST TECHNIQUE: Multiplanar, multiecho pulse sequences of the brain and surrounding structures  were obtained without intravenous contrast. COMPARISON:  10/20/2013 CT head. FINDINGS: Mild motion degradation of all sequences. Brain: Cerebellar tonsils extend 4.7 mm below the foramen magnum. The tonsils are not pointed and there is no significant crowding of the posterior fossa. No diffusion signal abnormality. No significant T2 FLAIR signal abnormality. Mild diffuse brain parenchymal volume loss. Midline structures are intact including a complete vermis, corpus callosum, and morphologically normal pituitary gland. No gross disorder cortical formation or heterotopia. No abnormal susceptibility hypointensity to indicate intracranial hemorrhage. Hippocampi by are symmetric in size and signal bilaterally. Vascular: Normal flow voids. Skull and upper cervical spine: Normal marrow signal. Sinuses/Orbits: Mild mucosal thickening of maxillary sinuses and ethmoid air cells. Otherwise the visualized paranasal sinuses and mastoid air cells are unremarkable. No abnormal signal of the orbits. Other: 14 mm left paramedian vertex scalp lipoma. IMPRESSION: 1. Mild motion degradation of all sequences. 2. No structural cause of seizure identified. 3. No acute intracranial abnormality. 4. Low-lying cerebellar tonsils. Morphology is not suggestive of Chiari malformation, likely normal variant. 5. Mild brain parenchymal volume loss. 6. Mild paranasal sinus disease. Electronically Signed  By: Kristine Garbe M.D.   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 He was discontinued from cefazolin. His most recent cx is in result of him having lapse of access to his abtx after his I x D and poly-exchange on 11/10. The patient does not want to go to a skilled facility and there is a concern for picc line misuse at home.  Treatment option outside the standard of care are limited given his  medical problems and possible loss of insurance.  - will avoid FQ due to risk of lower seizure threshold per neurology request, which limits [place] further   - linezolid is a potential option which is oral, though costly, and difficult to sustain for 6 wk  - additional option though not commonly used, to do a dose of oritavancin which would give you 10-14 day anti-staphylococcal coverage then, place remaining period of linezolid  - ideally would recommend that he undergoes a 2 staged revision. The staph infection is still present and would be difficult to eradicate on less than ideal regimens.   Unprovoked seizure = awaiting results from EEG to decide if he needs AED  Chronic hepatitis C without hepatic coma = nearly finished with his treatment course. For now, we will avoid placing on rifampin due to drug interaction with curing hep c infection. Once he finishes his hep C treatment, roughly on December 1st, he can restart on rifampin if he still has retained Hw  Chronic hepatitis B = continue with tenofovir QOd dosing which is renally dosed. His hep b DNA viral load much improved in October. He initially had hep B flare when he started his hep C treatment. Will check hep B viral load.  Baxter Flattery [facility] for Infectious Diseases Cell: [phone] Pager: [phone]  01/20/2016, 12:06 PM

## 2016-01-20 NOTE — Progress Notes (Signed)
EEG completed, results pending. 

## 2016-01-20 NOTE — Care Management Note (Signed)
Case Management Note  Patient Details  Name: Juan Wiggins MRN: [id number] Date of Birth: 1950/04/24  Subjective/Objective:   Presents with septic arthritis, 5 days post op arthroscopic washout , per pt eval rec hhpt for patient he states he has had [facility] in the past when he had a knee replacement, but he feels like he does not need pt, and states he does not want pt.  Patient lives with his father and his father's care taker, he has a quad cane in the room and 2 more canes at home , a rolling walker, a wheelchair, and cructches. He has medication coverage and a pcp , and he has transportation at dc.   Patient will receive one time oritavancin which will stay in his system for 2 weeks and then will get another long acting dalbavancin followed by po vs using bioavaiable po option such as zyvox, per Dr. Tommy Medal note.  NCM will cont to follow for dc needs.                  Action/Plan:   Expected Discharge Date:                  Expected Discharge Plan:     In-House Referral:     Discharge planning Services     Post Acute Care Choice:    Choice offered to:  Patient  DME Arranged:    DME Agency:     HH Arranged:  PT, Patient Refused [employer] Agency:     Status of Service:  In process, will continue to follow  If discussed at Long Length of Stay Meetings, dates discussed:    Additional Comments:  Zenon Mayo, RN 01/20/2016, 11:21 AM

## 2016-01-20 NOTE — Procedures (Signed)
Electroencephalogram (EEG) Report  Date of study: 01/20/16  Requesting clinician: Marina Goodell, MD  Reason for study: Evaluation for seizure  Brief clinical history: This is a 65 year old man who had a witnessed generalized tonic-clonic seizure during his admission for treatment of a septic joint. EEG is being performed as part of this evaluation.  Medications:  Current Facility-Administered Medications:  .  acetaminophen (TYLENOL) tablet 650 mg, 650 mg, Oral, Q6H PRN **OR** acetaminophen (TYLENOL) suppository 650 mg, 650 mg, Rectal, Q6H PRN, Latanya Maudlin, MD .  albuterol (PROVENTIL) (2.5 MG/3ML) 0.083% nebulizer solution 3 mL, 3 mL, Inhalation, Daily PRN, Sela Hua, MD .  amLODipine (NORVASC) tablet 10 mg, 10 mg, Oral, Daily, [name], MD, 10 mg at 01/20/16 1118 .  aspirin EC tablet 325 mg, 325 mg, Oral, Q breakfast, Latanya Maudlin, MD, 325 mg at 01/20/16 0801 .  bisacodyl (DULCOLAX) EC tablet 5 mg, 5 mg, Oral, Daily PRN, Latanya Maudlin, MD .  DAPTOmycin (CUBICIN) 700 mg in sodium chloride 0.9 % IVPB, 700 mg, Intravenous, Q24H, Latanya Maudlin, MD, 700 mg at 01/20/16 1245 .  escitalopram (LEXAPRO) tablet 20 mg, 20 mg, Oral, Daily, Sela Hua, MD, 20 mg at 01/20/16 1118 .  ferrous sulfate tablet 325 mg, 325 mg, Oral, TID PC, Latanya Maudlin, MD, 325 mg at 01/18/16 [id number] .  hydrALAZINE (APRESOLINE) injection 5 mg, 5 mg, Intravenous, Q4H PRN, Katheren Shams, DO .  HYDROcodone-acetaminophen (NORCO/VICODIN) 5-325 MG per tablet 1-2 tablet, 1-2 tablet, Oral, Q4H PRN, Latanya Maudlin, MD, 2 tablet at 01/20/16 1748 .  HYDROmorphone (DILAUDID) injection 1 mg, 1 mg, Intravenous, Q2H PRN, Latanya Maudlin, MD, 1 mg at 01/18/16 2156 .  hydrOXYzine (ATARAX/VISTARIL) tablet 25 mg, 25 mg, Oral, TID PRN, Sela Hua, MD .  ketotifen (ZADITOR) 0.025 % ophthalmic solution 1 drop, 1 drop, Both Eyes, BID, Veatrice Bourbon, MD, 1 drop at 01/20/16 1119 .  lactated ringers infusion, , Intravenous,  Continuous, Latanya Maudlin, MD .  lactated ringers infusion, , Intravenous, Continuous, Latanya Maudlin, MD .  levothyroxine (SYNTHROID, LEVOTHROID) tablet 200 mcg, 200 mcg, Oral, QAC breakfast, Sela Hua, MD, 200 mcg at 01/20/16 0801 .  LORazepam (ATIVAN) injection 2 mg, 2 mg, Intravenous, PRN, Alyssa A Haney, MD .  menthol-cetylpyridinium (CEPACOL) lozenge 3 mg, 1 lozenge, Oral, PRN **OR** phenol (CHLORASEPTIC) mouth spray 1 spray, 1 spray, Mouth/Throat, PRN, Latanya Maudlin, MD .  methocarbamol (ROBAXIN) tablet 500 mg, 500 mg, Oral, Q6H PRN **OR** methocarbamol (ROBAXIN) 500 mg in dextrose 5 % 50 mL IVPB, 500 mg, Intravenous, Q6H PRN, Latanya Maudlin, MD .  methocarbamol (ROBAXIN) tablet 500 mg, 500 mg, Oral, Q6H PRN, Sela Hua, MD, 500 mg at 01/16/16 0540 .  metoCLOPramide (REGLAN) tablet 5-10 mg, 5-10 mg, Oral, Q8H PRN, 10 mg at 01/19/16 2323 **OR** metoCLOPramide (REGLAN) injection 5-10 mg, 5-10 mg, Intravenous, Q8H PRN, Latanya Maudlin, MD, 10 mg at 01/16/16 0956 .  ondansetron (ZOFRAN) tablet 4 mg, 4 mg, Oral, Q6H PRN, 4 mg at 01/20/16 0337 **OR** ondansetron (ZOFRAN) injection 4 mg, 4 mg, Intravenous, Q6H PRN, Latanya Maudlin, MD, 4 mg at 01/19/16 1759 .  polyethylene glycol (MIRALAX / GLYCOLAX) packet 17 g, 17 g, Oral, Daily PRN, Latanya Maudlin, MD, 17 g at 01/18/16 0948 .  tamsulosin (FLOMAX) capsule 0.4 mg, 0.4 mg, Oral, QPC breakfast, Sela Hua, MD, 0.4 mg at 01/20/16 0800 .  tenofovir (VIREAD) tablet 300 mg, 300 mg, Oral, Daily, [name], MD, 300 mg at 01/20/16 1118 .  zolpidem (AMBIEN) tablet 5 mg, 5 mg, Oral, QHS PRN, Sela Hua, MD, 5 mg at 01/17/16 2250  Description: This is a routine EEG performed using standard international 10-20 electrode placement. A total of 18 channels are recorded, including one for the EKG. Wakefulness and drowsiness are recorded.   Activating Maneuvers: Photic stimulation  Findings:  The EKG channel demonstrates a regular rhythm with a  rate of 70 beats per minute.   The background consists of alpha activity with some intermixed theta.. The best dominant posterior rhythm is 9-10 hertz. This is symmetric and reacts as expected with eye opening. There is intermixed bifrontal beta activity. Voltages are mildly reduced throughout.  There are no focal asymmetries. No epileptiform discharges are present. No seizures are recorded.   Drowsiness is recorded and is normal in appearance.   He demonstrates a normal photic driving response with photic stimulation.  Impression: This is an essentially normal awake and drowsy EEG. The presence of reduced voltages and intermixed beta activity is most commonly seen with anxiety and the administration of benzodiazepines. Clinical correlation is required. There is no evidence of an epileptic focus that would suggest high likelihood of persistent or recurrent seizure.   Melba Coon, MD [facility]

## 2016-01-20 NOTE — Progress Notes (Signed)
Neurology Progress Note  Subjective: Chart reviewed at length. This is a 39-yo man admitted on 01/14/16 for treatment of an infection of his prosthetic R knee. He had previously been admitted for same and was discharged on a six week course of IM ceftriaxone (his UDS was positive for cocaine at the time of that admission so it was not felt to be prudent for him to be discharge with a PICC for IV therapy). He was placed in IV cefazolin with synovial fluid cultures subsequently growing staph aureus. He underwent arthroscopic washout of his right knee. ID and ortho plan for removal of the artificial joint with placement of antibiotic beads this week.   On 01/18/16, the patient had witnessed seizure activity. According to rapid response nurse note, the floor RN called her after finding the patient "staring off into space followed by violent jerking of arms and legs" with associated tongue biting and urinary incontinence. On the RRT RN assessment, she noted that he was postictal, disoriented but able to follow simple commands without focal deficits. Neurology was consulted and the patient was seen by Dr. Leonel Ramsay. MRI brain was obtained and EEG was ordered. There was some concern that his seizure may have been provoked by cefazolin and this was subsequently changed to daptomycin. It was not clear if this was provoked or unprovoked. AEDs were withheld pending results of testing.   This morning the patient states that he is doing well and has not specific complaints. He has not had any further seizure activity. He is eating well. He is not getting much rest in the hospital. He denies any previous history of seizure. He denies h/o TBI, stroke, or CNS infection. He is not aware of any family members with seizure or epilepsy. He is hoping that he will be able to go home soon. The family medicine resident note documented that the patient was transferring himself from bed to chair when he stared into space, became  unresponsive to commands, and then had tonic-clonic activity lasting 20 seconds.   Current Meds:   Current Facility-Administered Medications:  .  acetaminophen (TYLENOL) tablet 650 mg, 650 mg, Oral, Q6H PRN **OR** acetaminophen (TYLENOL) suppository 650 mg, 650 mg, Rectal, Q6H PRN, Latanya Maudlin, MD .  albuterol (PROVENTIL) (2.5 MG/3ML) 0.083% nebulizer solution 3 mL, 3 mL, Inhalation, Daily PRN, Sela Hua, MD .  amLODipine (NORVASC) tablet 10 mg, 10 mg, Oral, Daily, [name], MD, 10 mg at 01/19/16 [id number] .  aspirin EC tablet 325 mg, 325 mg, Oral, Q breakfast, Latanya Maudlin, MD, 325 mg at 01/20/16 0801 .  bisacodyl (DULCOLAX) EC tablet 5 mg, 5 mg, Oral, Daily PRN, Latanya Maudlin, MD .  DAPTOmycin (CUBICIN) 700 mg in sodium chloride 0.9 % IVPB, 700 mg, Intravenous, Q24H, Latanya Maudlin, MD, 700 mg at 01/19/16 1256 .  escitalopram (LEXAPRO) tablet 20 mg, 20 mg, Oral, Daily, Sela Hua, MD, 20 mg at 01/19/16 [phone]  ferrous sulfate tablet 325 mg, 325 mg, Oral, TID PC, Latanya Maudlin, MD, 325 mg at 01/18/16 [id number] .  hydrALAZINE (APRESOLINE) injection 5 mg, 5 mg, Intravenous, Q4H PRN, Katheren Shams, DO .  HYDROcodone-acetaminophen (NORCO/VICODIN) 5-325 MG per tablet 1-2 tablet, 1-2 tablet, Oral, Q4H PRN, Latanya Maudlin, MD, 2 tablet at 01/20/16 0801 .  HYDROmorphone (DILAUDID) injection 1 mg, 1 mg, Intravenous, Q2H PRN, Latanya Maudlin, MD, 1 mg at 01/18/16 2156 .  hydrOXYzine (ATARAX/VISTARIL) tablet 25 mg, 25 mg, Oral, TID PRN, Sela Hua, MD .  ketotifen (  ZADITOR) 0.025 % ophthalmic solution 1 drop, 1 drop, Both Eyes, BID, Veatrice Bourbon, MD, 1 drop at 01/19/16 2324 .  lactated ringers infusion, , Intravenous, Continuous, Latanya Maudlin, MD .  lactated ringers infusion, , Intravenous, Continuous, Latanya Maudlin, MD .  levothyroxine (SYNTHROID, LEVOTHROID) tablet 200 mcg, 200 mcg, Oral, QAC breakfast, Sela Hua, MD, 200 mcg at 01/20/16 0801 .  LORazepam (ATIVAN) injection 2 mg,  2 mg, Intravenous, PRN, Alyssa A Haney, MD .  menthol-cetylpyridinium (CEPACOL) lozenge 3 mg, 1 lozenge, Oral, PRN **OR** phenol (CHLORASEPTIC) mouth spray 1 spray, 1 spray, Mouth/Throat, PRN, Latanya Maudlin, MD .  methocarbamol (ROBAXIN) tablet 500 mg, 500 mg, Oral, Q6H PRN **OR** methocarbamol (ROBAXIN) 500 mg in dextrose 5 % 50 mL IVPB, 500 mg, Intravenous, Q6H PRN, Latanya Maudlin, MD .  methocarbamol (ROBAXIN) tablet 500 mg, 500 mg, Oral, Q6H PRN, Sela Hua, MD, 500 mg at 01/16/16 0540 .  metoCLOPramide (REGLAN) tablet 5-10 mg, 5-10 mg, Oral, Q8H PRN, 10 mg at 01/19/16 2323 **OR** metoCLOPramide (REGLAN) injection 5-10 mg, 5-10 mg, Intravenous, Q8H PRN, Latanya Maudlin, MD, 10 mg at 01/16/16 0956 .  ondansetron (ZOFRAN) tablet 4 mg, 4 mg, Oral, Q6H PRN, 4 mg at 01/20/16 0337 **OR** ondansetron (ZOFRAN) injection 4 mg, 4 mg, Intravenous, Q6H PRN, Latanya Maudlin, MD, 4 mg at 01/19/16 1759 .  polyethylene glycol (MIRALAX / GLYCOLAX) packet 17 g, 17 g, Oral, Daily PRN, Latanya Maudlin, MD, 17 g at 01/18/16 0948 .  Sofosbuvir-Velpatasvir 400-100 MG TABS 1 tablet, 1 tablet, Oral, Daily, Latanya Maudlin, MD, 1 tablet at 01/19/16 1035 .  tamsulosin (FLOMAX) capsule 0.4 mg, 0.4 mg, Oral, QPC breakfast, Sela Hua, MD, 0.4 mg at 01/20/16 0800 .  tenofovir (VIREAD) tablet 300 mg, 300 mg, Oral, Daily, [name], MD .  zolpidem (AMBIEN) tablet 5 mg, 5 mg, Oral, QHS PRN, Sela Hua, MD, 5 mg at 01/17/16 2250  Objective:  Temp:  [98.2 F (36.8 C)-99.2 F (37.3 C)] 98.4 F (36.9 C) (11/27 0810) Pulse Rate:  [62-89] 62 (11/27 0810) Resp:  [12-18] 12 (11/27 0810) BP: (140-149)/(78-98) 148/86 (11/27 0810) SpO2:  [93 %-98 %] 96 % (11/27 0810)  General: WDWN Caucasian man lying in bed in NAD. Alert, oriented x4. Speech is clear without dysarthria. Affect is bright. Comportment is normal.  HEENT: Neck is supple without lymphadenopathy. Mucous membranes are moist and the oropharynx is clear. Sclerae  are anicteric. There is no conjunctival injection.  CV: Regular, no murmur. Carotid pulses are 2+ and symmetric with no bruits. Distal pulses 2+ and symmetric.  Lungs: CTAB  Extremities: No C/C/E. Neuro: MS: As noted above. No aphasia.  CN: Pupils are equal and reactive from 3-->2 mm bilaterally. EOMI, no nystagmus. Facial sensation is intact to light touch. Face is symmetric at rest with normal strength and mobility. Hearing is intact to conversational voice. Voice is normal in tone and quality. Palate elevates symmetrically. Uvula is midline. Bilateral SCM and trapezii are 5/5. Tongue is midline with normal bulk and mobility.  Motor: Normal bulk, tone, and strength throughout. No pronator drift. No tremor or other abnormal movements are observed.  Sensation: Intact to light touch, pinprick, vibration, and joint position.  DTRs: 2+, symmetric. Toes are downgoing bilaterally. No pathological reflexes.  Coordination: Finger-to-nose and heel-to-shin are without dysmetria bilaterally.    Labs: Lab Results  Component Value Date   WBC 10.1 01/20/2016   HGB 13.2 01/20/2016   HCT 38.8 (L) 01/20/2016   PLT  359 01/20/2016   GLUCOSE 108 (H) 01/20/2016   TRIG 88 05/22/2014   ALT <5 (L) 01/19/2016   AST 27 01/19/2016   NA 139 01/20/2016   K 4.2 01/20/2016   CL 108 01/20/2016   CREATININE 1.47 (H) 01/20/2016   BUN 8 01/20/2016   CO2 25 01/20/2016   TSH 6.303 (H) 01/17/2016   INR 1.27 12/29/2015   CBC Latest Ref Rng & Units 01/20/2016 01/19/2016 01/19/2016  WBC 4.0 - 10.5 K/uL 10.1 9.5 9.2  Hemoglobin 13.0 - 17.0 g/dL 13.2 12.5(L) 13.1  Hematocrit 39.0 - 52.0 % 38.8(L) 37.1(L) 38.0(L)  Platelets 150 - 400 K/uL 359 345 330    No results found for: HGBA1C Lab Results  Component Value Date   ALT <5 (L) 01/19/2016   AST 27 01/19/2016   ALKPHOS 123 01/19/2016   BILITOT 0.3 01/19/2016   HIV nonreactive TSH 6.303 fT4 1.07 Mg 1.8  Radiology: I have personally and independently reviewed  the MRI brain without contrast from 01/19/16. This shows mild diffuse generalized atrophy and low-lying cerebellar tonsils. There is some mild artifact from patient motion.   A/P:   1. First-time seizure: Available history suggests GTC seizure. He has no prior history of seizure and no clear historical risk factors for epilepsy. Cephalosporins can lower seizure threshold so it is possible that this was provoked by the use of IV cefazolin; this has been discontinued and he is presently on daptomycin. He has a known history of cocaine abuse but no UDS was performed at the time of his seizure. MRI does not show any structural lesions that would be expected to predispose to seizure. EEG is pending. At this time, I would agree with holding AED therapy unless the EEG is abnormal and suggests a high likelihood of recurrence.   Seizure precautions were discussed with the patient. Per [facility] statutes, patients with seizures are not allowed to drive until they have been seizure-free for six months. Use caution when using heavy equipment or power tools. Avoid working on ladders or at heights. Take showers instead of baths. Ensure the water temperature is not too high on the home water heater. Do not go swimming alone. He verbalizes understanding.   A total of 37 minutes was spent on this case, greater than 50% of which was face-to-face with the patient. This also includes review of his medical record, direct visualization of MRI, and review of labs and ancillary studies.   Melba Coon, MD [facility]

## 2016-01-20 NOTE — Progress Notes (Signed)
Subjective: 5 Days Post-Op Procedure(s) (LRB): ARTHROSCOPIC WASHOUT (Right) Patient reports pain as 2 on 0-10 scale.    Objective: Vital signs in last 24 hours: Temp:  [98.2 F (36.8 C)-99.2 F (37.3 C)] 98.3 F (36.8 C) (11/27 0325) Pulse Rate:  [66-89] 89 (11/27 0325) Resp:  [12-18] 17 (11/27 0325) BP: (140-149)/(78-98) 140/87 (11/27 0325) SpO2:  [93 %-98 %] 96 % (11/27 0325)  Intake/Output from previous day: 11/26 0701 - 11/27 0700 In: 560 [P.O.:560] Out: 450 [Urine:450] Intake/Output this shift: No intake/output data recorded.   Recent Labs  01/18/16 0437 01/19/16 0137 01/19/16 0358 01/20/16 0423  HGB 12.4* 13.1 12.5* 13.2    Recent Labs  01/19/16 0358 01/20/16 0423  WBC 9.5 10.1  RBC 3.90* 4.08*  HCT 37.1* 38.8*  PLT 345 359    Recent Labs  01/19/16 0358 01/20/16 0423  NA 138 139  K 3.8 4.2  CL 105 108  CO2 25 25  BUN 8 8  CREATININE 1.52* 1.47*  GLUCOSE 99 108*  CALCIUM 8.6* 8.6*   No results for input(s): LABPT, INR in the last 72 hours.  Continues to have a Knee effusion. Will plan on removal of prosthesis asnd insert a spacer.  Assessment/Plan: 5 Days Post-Op Procedure(s) (LRB): ARTHROSCOPIC WASHOUT (Right) Up with therapy. Plans for removal of his prosthesis and insert a spacer.  Cottrell Gentles A 01/20/2016, 7:22 AM

## 2016-01-20 NOTE — Progress Notes (Signed)
Family Medicine Teaching Service Daily Progress Note Intern Pager: [phone]  Patient name: Juan Wiggins Medical record number: [id number] Date of birth: [birth year] Age: 65 y.o. Gender: male  Primary Care Provider: Helane Rima, MD Consultants:  Ortho, ID Code Status: Full  Pt Overview and Major Events to Date:  11/22 - Admitted for R septic knee (re-admission), joint aspiration by ortho 11/23 - Surgical I&D R knee by orthopedics 11/26 -   Assessment and Plan: Juan Locket Jonesis a 65 y.o.malepresenting with right knee pain presenting with septic joint, readmit, has been off of antibiotics 7 days. PMH is significant for hypothyroidism, alcohol abuse, Hepatitis B, Hepatitis C, HTN, GERD, chronic pain syndrome, hx of TKA (both in 2015), hx of R septic knee (06/2015), major depression, anxiety, BPH.   # Unprovoked Seizure, one occurrence -  Patient had witnessed tonic-clonic seizure-like activity 11/26. No history of seizures per patient and chart review. Broad differential considered, seems to be an unprovoked seizure at this point. Though not a clear cause of the seizure, cephalosporins may lower the seizure threshold with AKI (Cr 1.52 this AM) so we can consider transitioning antibiotics.  This is not a clear cause of the seizure, and per neuro this is considered an unprovoked seizure.  - continue to monitor in step down - EEG still pending, will call to follow up  - appreciate neurology recs - transitioned to daptomycin per ID recs  # Right knee septic joint. s/p arthroscopy with irrigation and debridement x1, was last admitted 11/5 - 11/13, discharged with plans for home ceftriaxone IM presented s/p 7d without antibiotics. Leukocytosis resolved. Joint cultures from aspirate  growing few staph aureus. - s/p 6 days Ancef 2g q8h (11/21 >11/26) - continue daptomycin (11/26 - ) as discussed above - Synovial cultures pan-sensitive staph aureus - Likely plan per ID and ortho will be  placement of antibiotic spacer followed by parenteral therapy. Options per ID include IV ortavancin followed by Dalbavancin followed by PO, vs using Linezolid - appreciate ID recs, ortho recs - Bcx no growth - pain control with dilaudid 1mg  q2hr and vicoden q4hr prn.  - PT/OT  # AKI, improving Cr 2.36 >>>1.47 (baseline 1.16). Likely prerenal, improving every day. - will recheck BMP tomorrow AM - avoid nephrotoxic agents  # RLQ pain, thought to be pain radiating from R knee or from back pain. No signs of UTI or abdominal process. Abdominal exam benign. +2 episodes diarrhea over past day, describes black stool. - will monitor - FOBT - Hgb stable 13.2 - can consider pelvic/scrotol [place]  # history of substance abuse/UDS positive for cocaine/history of EtOH use. - patient denies alcohol or drug use at this time. History of alcohol use, he states 2 years ago, however was treated 1 year ago per chart review. +cocaine on his three most recent urine drug screens, though patient denies use. - repeat UDS with only benzos and opiods - monitor on CIWA, 7, 2, 2 overnight  For N/V, tremor, sweats, anxiety and agitation  # Hypertension, improved. BP improved at 140/87 this morning.Home propanolol recently dc'd due to cocaine use.   - continue 10 mg amlodipine daily - monitor BP - hydralazine prn for elevated pressures  #Hepatitis B, chronic and active.At home on Viread 300 mg daily.  - Continue home Viread 300 mg dosed by pharmacy every other day due to Cr Cl - Monitor  #Hepatitis C, chronic and active. At home on Sofosbuvir-Velpatasvir Juan Wiggins).  - Continue home Epclusa 400-100 mg  po tab daily  - Monitor  #Hypothyroidism. Stable. At home on Levothyroxine. Previously TSH 0.20 on last admission with 200 mcg synthroid daily however dose not changed given acute illness.  Repeat this admission TSH 6.303, fT4 WNL at 1.07.   - Continue home Synthroid 200 mcg daily - will need recheck TSH in  6 weeks   #difficulty sleeping. At home on Ambien 10 mg at bedtime.  - Ambien 5 mg PRN  #Depression . Stable. At home on Lexapro and Remeron. Also on Ambien 10mg  PRN  -Continue Lexapro 20 mg daily at bedtime   #BPH:On Silodosin 8mg  daily at home - flomax 0.4 mg Qd while hospitalized  #Chronic lumbar spine pain - stable. No neurologic deficits. - monitor  # ?Asthma - not indicated on problem list however albuterol on med list. - PRN albuterol  - Will follow up whether pt uses this  FEN/GI: KVO, Regular diet  Disposition: Awaiting further work-up. Plan to discharge to SNF with PICC if patient consents  Subjective:  Patient with knee pain controlled this AM. Describes diarrhea x2 yesterday, black stools. Hgb stable, will order FOBT.  Some RLQ abdominal pain consistent with previous symptoms. No N/V, +tiredness but no more confusion s/p seizure x1 11/26. No acute events overnight.  Objective: Temp:  [98.2 F (36.8 C)-99.2 F (37.3 C)] 98.4 F (36.9 C) (11/27 0810) Pulse Rate:  [62-89] 62 (11/27 0810) Resp:  [12-18] 12 (11/27 0810) BP: (140-149)/(78-98) 148/86 (11/27 0810) SpO2:  [93 %-98 %] 96 % (11/27 0810) Physical Exam: General: NAD, lies comfortably in bed, sleepy Cardiovascular: RRR, no m/r/g Respiratory: CTA bil, no W/R/R Abdomen: soft, +minimal tenderness RLQ with some voluntary guarding, no rebound tenderness, nd, normoactive BS Extremities:+right knee edematous with sutures in place, no erythema or streaking Neuro: CN II-XII  intact  Laboratory:  Recent Labs Lab 01/19/16 0137 01/19/16 0358 01/20/16 0423  WBC 9.2 9.5 10.1  HGB 13.1 12.5* 13.2  HCT 38.0* 37.1* 38.8*  PLT 330 345 359    Recent Labs Lab 01/19/16 0137 01/19/16 0358 01/20/16 0423  NA 136 138 139  K 3.8 3.8 4.2  CL 105 105 108  CO2 24 25 25   BUN 9 8 8   CREATININE 1.57* 1.52* 1.47*  CALCIUM 8.5* 8.6* 8.6*  PROT 7.5  --   --   BILITOT 0.3  --   --   ALKPHOS 123  --   --    ALT <5*  --   --   AST 27  --   --   GLUCOSE 105* 99 108*    Imaging/Diagnostic Tests: Dg Knee 2 Views Right  Result Date: 01/14/2016 IMPRESSION: RIGHT knee prosthesis without acute complication. Soft tissue swelling RIGHT knee. Electronically Signed   By: Juan Wiggins Dana M.D.   On: 01/14/2016 12:39    [name], MD 01/20/2016, 9:20 AM PGY-1, [place] Intern pager: [phone], text pages welcome

## 2016-01-20 NOTE — Progress Notes (Signed)
ELISSA @ [facility] # [phone]   ORITIVANCIN IV ANTIBIOTIC   * NOT COVER *

## 2016-01-20 NOTE — Progress Notes (Signed)
Physical Therapy Treatment Patient Details Name: Juan Wiggins MRN: [id number] DOB: [birth year] Today's Date: 01/20/2016    History of Present Illness 65 y.o. male presenting with right knee pain presenting with septic joint, readmit, has been off of antibiotics 8 days. PMH is significant for hypothyroidism, alcohol abuse, Hepatitis B, Hepatitis C,  HTN, GERD, chronic pain syndrome, hx of TKA (both in 2015), hx of R septic knee (06/2015), major depression, anxiety, BPH    PT Comments    Patient progressing well towards PT goals. Improved ambulation distance today. Continues to demonstrate mild instability with use of quad cane but no overt LOB. Reports some weakness in LLE as well. Instructed pt in exercises to perform BLEs for strengthening and encouraged daily ambulation with RN. Will follow.   Follow Up Recommendations  Home health PT     Equipment Recommendations  None recommended by PT    Recommendations for Other Services       Precautions / Restrictions Precautions Precautions: None Restrictions Weight Bearing Restrictions: Yes RLE Weight Bearing: Weight bearing as tolerated    Mobility  Bed Mobility               General bed mobility comments: Up in chair upon PT arrival.   Transfers Overall transfer level: Modified independent Equipment used: Quad cane Transfers: Sit to/from Stand Sit to Stand: Modified independent (Device/Increase time)         General transfer comment: increased time, good technique  Ambulation/Gait Ambulation/Gait assistance: Supervision Ambulation Distance (Feet): 200 Feet Assistive device: Quad cane Gait Pattern/deviations: Step-to pattern;Step-through pattern;Antalgic   Gait velocity interpretation: Below normal speed for age/gender General Gait Details: antalgic gait with safe use of quad cane. Mildly unsteady but no LOB. HR ranged from 80s-120 bpm.   Stairs            Wheelchair Mobility    Modified Rankin  (Stroke Patients Only)       Balance Overall balance assessment: Needs assistance Sitting-balance support: Feet supported;No upper extremity supported Sitting balance-Leahy Scale: Normal     Standing balance support: During functional activity Standing balance-Leahy Scale: Fair                      Cognition Arousal/Alertness: Awake/alert Behavior During Therapy: WFL for tasks assessed/performed Overall Cognitive Status: Within Functional Limits for tasks assessed                      Exercises General Exercises - Lower Extremity Quad Sets: Both;10 reps;Seated Long Arc Quad: Right;10 reps;Seated Hip ABduction/ADduction: Both;10 reps;Seated Hip Flexion/Marching: Right;10 reps;Seated    General Comments        Pertinent Vitals/Pain Pain Assessment: 0-10 Pain Score: 7  Pain Location: right knee Pain Descriptors / Indicators: Sore Pain Intervention(s): Monitored during session;Repositioned    Home Living                      Prior Function            PT Goals (current goals can now be found in the care plan section) Progress towards PT goals: Progressing toward goals    Frequency    Min 3X/week      PT Plan Current plan remains appropriate    Co-evaluation             End of Session Equipment Utilized During Treatment: Gait belt Activity Tolerance: Patient tolerated treatment well Patient left: in chair;with call bell/phone within  reach     Time: [id number] PT Time Calculation (min) (ACUTE ONLY): 17 min  Charges:  $Gait Training: 8-22 mins                    G Codes:      Bettie Swavely A Elisha Mcgruder 01/20/2016, 10:58 AM  Wray Kearns, PT, DPT [phone]

## 2016-01-20 NOTE — Op Note (Signed)
NAME:  Juan Wiggins, Juan Wiggins NO.:  [id number]  MEDICAL RECORD NO.:  [id number]  LOCATION:                                 FACILITY:  PHYSICIAN:  Kipp Brood. Samya Siciliano, M.D.DATE OF BIRTH:  February 04, 1951  DATE OF PROCEDURE:  01/15/2016 DATE OF DISCHARGE:                              OPERATIVE REPORT   PREOPERATIVE DIAGNOSIS:  Questionable reinfection of right total knee.  POSTOPERATIVE DIAGNOSIS:  Questionable reinfection of right total knee.  SURGEON:  Kipp Brood. Adysson Revelle, M.D.  ASSISTANT:  OR nurse.  Note, this gentleman had a right total knee done a few years ago, and then several months ago, he stated he was admitted to [facility] with a spider bite on his wrist that became an abscess and as a result, he was septic and ended up in renal failure.  Apparently according to his history, he was in that hospital for 3 weeks.  He was then discharged and a few weeks later, called our office because of a painful total knee.  He was then admitted by one of my associates, who took him to Surgery, washed his knee out with arthroscopic irrigation.  Two days later, I took from back and did a polyethylene exchange and a debridement and irrigation of his knee.  He did extremely well until recently.  About 3 weeks ago, his infection recurred.  He was then taken back to Surgery by my associate, Dr. Rolena Infante, had an irrigation and I took him back to Surgery the few days later and did another arthroscopic irrigation, his cultures showed no growth.  He had a PICC line in.  The Infectious Disease saw him and they had planned to send him home, but according to the nurse and case manager, I was called and then I refused to send him home with a PICC line because he had tested positive for cocaine.  Now, on looking on his history, he had tested positive three times for cocaine.  I confronted him with it, he denied it.  He said he does not and "I had got into system."  So, the bottom line is,  he was discharged on IM antibiotic instead by Infectious Disease.  He never did get his IM antibiotics.  I am not sure why, apparently he had some reasons for that, but he came back and admitted on Tuesday, which was one day ago by Infectious Disease, to be started on IV antibiotics.  I saw him in consultation last night on January 14, 2016, I aspirated his knee.  There was some serosanguineous-like fluid.  I was not sure if it was infected, but I still sent it for cultures and sensitivities for aerobic and anaerobic.  Twelve hours later the next day, it showed no growth thus far, so I really did not want to take another chance on him, so I took him back to Surgery today, did another arthroscopic irrigation and at first 5 or 10 mL of fluid was serosanguineous in appearance.  I did a reculture and noted he was on Ancef IV prior to that and then began the irrigation of his knee with about 3000 mL of fluid and it  continued to remain clear.  I milked the knee, massaged the knee and made sure there were no pockets of pus present there or not. Incidentally, he was also afebrile on admission, and afebrile today. His white count on admission was 12.7 and today was 11.7.  So, anyway, this was more of a prophylactic diagnostic procedure because our main issue was trying not to remove that total knee especially with his history.  The nice thing about this yesterday when he came in, I retested him, drug tested him and there was no cocaine present.  Anyway, I did do the irrigation, and a small incision was made over the medial superior aspect of the knee.  Inflow cannula was inserted.  Another small punctate incision was made laterally and another outflow cannula was inserted and I thoroughly irrigated out the knee, as I mentioned, he had about 10 mL of serosanguineous-like fluid at the beginning, I recultured that.  I continued to irrigate out the knee and fluid remained clear all the way through.   I then sutured as two little incisions.  There was no reason to put a drain at this time, and we applied a bundle dressing and left the operative room in satisfactory condition.  PLAN:  He will be maintained on IV antibiotics.  He will be maintained under the direction of the Infectious Disease physician.  We will wait for the culture and aspiration, I did yesterday on January 14, 2016. The second culture was done today on January 15, 2016, and we will look at both of those cultures to determine what he need to go home on and how long term he needs to be on antibiotic that will be determined by the Infectious Disease doctors.    ______________________________ Kipp Brood Seraphim Trow, M.D.   ______________________________ Kipp Brood. Gladstone Lighter, M.D.    RAG/MEDQ  D:  01/15/2016  T:  01/16/2016  Job:  [id number]

## 2016-01-20 NOTE — Clinical Social Work Note (Signed)
CSW acknowledges SNF consult. PT recommending HHPT.  CSW signing off. Consult again if any social work needs arise.  Gemma Ruan, CSW [phone]  

## 2016-01-20 NOTE — Anesthesia Postprocedure Evaluation (Signed)
Anesthesia Post Note  Patient: Juan Wiggins  Procedure(s) Performed: Procedure(s) (LRB): ARTHROSCOPIC WASHOUT (Right)  Patient location during evaluation: PACU Anesthesia Type: General Level of consciousness: sedated and patient cooperative Pain management: pain level controlled Vital Signs Assessment: post-procedure vital signs reviewed and stable Respiratory status: spontaneous breathing Cardiovascular status: stable Anesthetic complications: no    Last Vitals:  Vitals:   01/19/16 2320 01/20/16 0325  BP: (!) 149/93 140/87  Pulse: 83 89  Resp: 13 17  Temp:  36.8 C    Last Pain:  Vitals:   01/20/16 0325  TempSrc: Oral  PainSc:                  Nolon Nations

## 2016-01-21 ENCOUNTER — Inpatient Hospital Stay (HOSPITAL_COMMUNITY): Payer: 59 | Admitting: Anesthesiology

## 2016-01-21 ENCOUNTER — Encounter (HOSPITAL_COMMUNITY): Payer: Self-pay | Admitting: Anesthesiology

## 2016-01-21 ENCOUNTER — Encounter (HOSPITAL_COMMUNITY)
Admission: EM | Disposition: A | Discharge status: Skilled Nursing Facility | Payer: Self-pay | Source: Home / Self Care | Attending: Orthopedic Surgery

## 2016-01-21 ENCOUNTER — Telehealth: Payer: Self-pay | Admitting: Pharmacist Clinician (PhC)/ Clinical Pharmacy Specialist

## 2016-01-21 ENCOUNTER — Other Ambulatory Visit: Payer: Self-pay | Admitting: Pharmacist Clinician (PhC)/ Clinical Pharmacy Specialist

## 2016-01-21 ENCOUNTER — Other Ambulatory Visit: Payer: Self-pay | Admitting: Internal Medicine

## 2016-01-21 HISTORY — PX: EXCISIONAL TOTAL KNEE ARTHROPLASTY WITH ANTIBIOTIC SPACERS: SHX5827

## 2016-01-21 LAB — CBC
HEMATOCRIT: 40.5 % (ref 39.0–52.0)
HEMOGLOBIN: 13.8 g/dL (ref 13.0–17.0)
MCH: 32.6 pg (ref 26.0–34.0)
MCHC: 34.1 g/dL (ref 30.0–36.0)
MCV: 95.7 fL (ref 78.0–100.0)
Platelets: 365 10*3/uL (ref 150–400)
RBC: 4.23 MIL/uL (ref 4.22–5.81)
RDW: 14.2 % (ref 11.5–15.5)
WBC: 12.1 10*3/uL — ABNORMAL HIGH (ref 4.0–10.5)

## 2016-01-21 LAB — BASIC METABOLIC PANEL
Anion gap: 6 (ref 5–15)
BUN: 9 mg/dL (ref 6–20)
CALCIUM: 8.7 mg/dL — AB (ref 8.9–10.3)
CHLORIDE: 108 mmol/L (ref 101–111)
CO2: 24 mmol/L (ref 22–32)
CREATININE: 1.3 mg/dL — AB (ref 0.61–1.24)
GFR calc non Af Amer: 57 mL/min — ABNORMAL LOW (ref >60)
Glucose, Bld: 90 mg/dL (ref 65–99)
Potassium: 3.9 mmol/L (ref 3.5–5.1)
SODIUM: 138 mmol/L (ref 135–145)

## 2016-01-21 SURGERY — REMOVAL, TOTAL ARTHROPLASTY HARDWARE, KNEE, WITH ANTIBIOTIC SPACER INSERTION
Anesthesia: General | Site: Knee | Laterality: Right

## 2016-01-21 SURGERY — Surgical Case
Anesthesia: *Unknown

## 2016-01-21 MED ORDER — POLYETHYLENE GLYCOL 3350 17 G PO PACK: 17.0000 g | PACK | Freq: Every day | ORAL | Status: DC | PRN

## 2016-01-21 MED ORDER — LIDOCAINE HCL (CARDIAC) 20 MG/ML IV SOLN
INTRAVENOUS | Status: DC | PRN
  Administered 2016-01-21: 100 mg via INTRATRACHEAL

## 2016-01-21 MED ORDER — HYDROMORPHONE HCL 1 MG/ML IJ SOLN
INTRAMUSCULAR | Status: AC
  Filled 2016-01-21: qty 1

## 2016-01-21 MED ORDER — ONDANSETRON HCL 4 MG/2ML IJ SOLN
4.0000 mg | Freq: Four times a day (QID) | INTRAMUSCULAR | Status: DC | PRN
  Administered 2016-01-23: 4 mg via INTRAVENOUS
  Filled 2016-01-21: qty 2

## 2016-01-21 MED ORDER — MIDAZOLAM HCL 2 MG/2ML IJ SOLN
INTRAMUSCULAR | Status: AC
  Filled 2016-01-21: qty 2

## 2016-01-21 MED ORDER — VANCOMYCIN HCL 500 MG IV SOLR
INTRAVENOUS | Status: AC
  Filled 2016-01-21: qty 1000

## 2016-01-21 MED ORDER — HYDROMORPHONE HCL 1 MG/ML IJ SOLN
0.2500 mg | INTRAMUSCULAR | Status: DC | PRN
  Administered 2016-01-21 (×4): 0.5 mg via INTRAVENOUS

## 2016-01-21 MED ORDER — METHOCARBAMOL 500 MG PO TABS
ORAL_TABLET | ORAL | Status: AC
  Filled 2016-01-21: qty 1

## 2016-01-21 MED ORDER — DEXMEDETOMIDINE BOLUS VIA INFUSION: 0.7000 ug/kg | Freq: Once | INTRAVENOUS | Status: DC

## 2016-01-21 MED ORDER — VANCOMYCIN HCL 500 MG IV SOLR
INTRAVENOUS | Status: AC
Start: 2016-01-21 — End: 2016-01-21
  Filled 2016-01-21: qty 500

## 2016-01-21 MED ORDER — HYDROMORPHONE HCL 2 MG/ML IJ SOLN: 1.0000 mg | INTRAMUSCULAR | Status: DC | PRN

## 2016-01-21 MED ORDER — HYDROMORPHONE HCL 2 MG/ML IJ SOLN
1.0000 mg | INTRAMUSCULAR | Status: DC | PRN
  Administered 2016-01-21 – 2016-01-22 (×5): 1 mg via INTRAVENOUS
  Filled 2016-01-21 (×5): qty 1

## 2016-01-21 MED ORDER — GENTAMICIN SULFATE 40 MG/ML IJ SOLN
INTRAMUSCULAR | Status: DC | PRN
  Administered 2016-01-21 (×5): 80 mg

## 2016-01-21 MED ORDER — GENTAMICIN SULFATE 40 MG/ML IJ SOLN
INTRAMUSCULAR | Status: AC
  Filled 2016-01-21: qty 10

## 2016-01-21 MED ORDER — HYDROMORPHONE HCL 1 MG/ML IJ SOLN
INTRAMUSCULAR | Status: AC
  Filled 2016-01-21: qty 0.5

## 2016-01-21 MED ORDER — ONDANSETRON HCL 4 MG/2ML IJ SOLN
INTRAMUSCULAR | Status: DC | PRN
  Administered 2016-01-21: 4 mg via INTRAVENOUS

## 2016-01-21 MED ORDER — FENTANYL CITRATE (PF) 100 MCG/2ML IJ SOLN
INTRAMUSCULAR | Status: AC
  Filled 2016-01-21: qty 2

## 2016-01-21 MED ORDER — LINEZOLID 600 MG PO TABS: 600.0000 mg | ORAL_TABLET | Freq: Two times a day (BID) | ORAL | 1 refills | Status: DC

## 2016-01-21 MED ORDER — SODIUM CHLORIDE 0.9 % IR SOLN
Status: DC | PRN
  Administered 2016-01-21: 500 mL

## 2016-01-21 MED ORDER — FERROUS SULFATE 325 (65 FE) MG PO TABS: 325.0000 mg | ORAL_TABLET | Freq: Three times a day (TID) | ORAL | Status: DC

## 2016-01-21 MED ORDER — TOBRAMYCIN SULFATE 1.2 G IJ SOLR
INTRAMUSCULAR | Status: AC
  Filled 2016-01-21: qty 2.4

## 2016-01-21 MED ORDER — LACTATED RINGERS IV SOLN
INTRAVENOUS | Status: DC
  Administered 2016-01-22 – 2016-01-24 (×3): via INTRAVENOUS

## 2016-01-21 MED ORDER — SODIUM CHLORIDE 0.9 % IR SOLN
Status: DC | PRN
  Administered 2016-01-21: 1000 mL

## 2016-01-21 MED ORDER — MIDAZOLAM HCL 2 MG/2ML IJ SOLN
2.0000 mg | Freq: Once | INTRAMUSCULAR | Status: AC
  Administered 2016-01-21: 2 mg via INTRAVENOUS

## 2016-01-21 MED ORDER — FENTANYL CITRATE (PF) 100 MCG/2ML IJ SOLN
INTRAMUSCULAR | Status: AC
Start: 2016-01-21 — End: 2016-01-21
  Filled 2016-01-21: qty 2

## 2016-01-21 MED ORDER — MENTHOL 3 MG MT LOZG: 1.0000 | LOZENGE | OROMUCOSAL | Status: DC | PRN

## 2016-01-21 MED ORDER — FENTANYL CITRATE (PF) 250 MCG/5ML IJ SOLN
INTRAMUSCULAR | Status: DC | PRN
  Administered 2016-01-21: 50 ug via INTRAVENOUS
  Administered 2016-01-21 (×2): 100 ug via INTRAVENOUS
  Administered 2016-01-21: 50 ug via INTRAVENOUS
  Administered 2016-01-21: 100 ug via INTRAVENOUS

## 2016-01-21 MED ORDER — VANCOMYCIN HCL 500 MG IV SOLR
INTRAVENOUS | Status: DC | PRN
  Administered 2016-01-21 (×3): 500 mg

## 2016-01-21 MED ORDER — ONDANSETRON HCL 4 MG/2ML IJ SOLN: 4.0000 mg | Freq: Once | INTRAMUSCULAR | Status: DC | PRN

## 2016-01-21 MED ORDER — SUGAMMADEX SODIUM 200 MG/2ML IV SOLN
INTRAVENOUS | Status: DC | PRN
  Administered 2016-01-21: 200 mg via INTRAVENOUS

## 2016-01-21 MED ORDER — BACITRACIN-NEOMYCIN-POLYMYXIN 400-5-5000 EX OINT
TOPICAL_OINTMENT | CUTANEOUS | Status: DC | PRN
  Administered 2016-01-21: 1 via TOPICAL

## 2016-01-21 MED ORDER — PROPOFOL 10 MG/ML IV BOLUS
INTRAVENOUS | Status: DC | PRN
  Administered 2016-01-21: 150 mg via INTRAVENOUS
  Administered 2016-01-21: 20 mg via INTRAVENOUS

## 2016-01-21 MED ORDER — ACETAMINOPHEN 650 MG RE SUPP: 650.0000 mg | Freq: Four times a day (QID) | RECTAL | Status: DC | PRN

## 2016-01-21 MED ORDER — BISACODYL 5 MG PO TBEC: 5.0000 mg | DELAYED_RELEASE_TABLET | Freq: Every day | ORAL | Status: DC | PRN

## 2016-01-21 MED ORDER — BACITRACIN-NEOMYCIN-POLYMYXIN 400-5-5000 EX OINT
TOPICAL_OINTMENT | CUTANEOUS | Status: AC
  Filled 2016-01-21: qty 1

## 2016-01-21 MED ORDER — ALUM & MAG HYDROXIDE-SIMETH 200-200-20 MG/5ML PO SUSP: 30.0000 mL | ORAL | Status: DC | PRN

## 2016-01-21 MED ORDER — ONDANSETRON HCL 4 MG PO TABS: 4.0000 mg | ORAL_TABLET | Freq: Four times a day (QID) | ORAL | Status: DC | PRN

## 2016-01-21 MED ORDER — LACTATED RINGERS IV SOLN
INTRAVENOUS | Status: DC | PRN
  Administered 2016-01-21 (×2): via INTRAVENOUS

## 2016-01-21 MED ORDER — FLEET ENEMA 7-19 GM/118ML RE ENEM: 1.0000 | ENEMA | Freq: Once | RECTAL | Status: DC | PRN

## 2016-01-21 MED ORDER — MIDAZOLAM HCL 5 MG/5ML IJ SOLN
INTRAMUSCULAR | Status: DC | PRN
  Administered 2016-01-21: 2 mg via INTRAVENOUS

## 2016-01-21 MED ORDER — ROCURONIUM BROMIDE 100 MG/10ML IV SOLN
INTRAVENOUS | Status: DC | PRN
  Administered 2016-01-21: 50 mg via INTRAVENOUS

## 2016-01-21 MED ORDER — METHOCARBAMOL 1000 MG/10ML IJ SOLN
500.0000 mg | Freq: Four times a day (QID) | INTRAVENOUS | Status: DC | PRN
  Filled 2016-01-21: qty 5

## 2016-01-21 MED ORDER — ASPIRIN EC 325 MG PO TBEC: 325.0000 mg | DELAYED_RELEASE_TABLET | Freq: Every day | ORAL | Status: DC

## 2016-01-21 MED ORDER — ACETAMINOPHEN 325 MG PO TABS: 650.0000 mg | ORAL_TABLET | Freq: Four times a day (QID) | ORAL | Status: DC | PRN

## 2016-01-21 MED ORDER — SODIUM CHLORIDE 0.9 % IR SOLN
Status: DC | PRN
  Administered 2016-01-21: 3000 mL

## 2016-01-21 MED ORDER — PROPOFOL 10 MG/ML IV BOLUS
INTRAVENOUS | Status: AC
  Filled 2016-01-21: qty 20

## 2016-01-21 MED ORDER — PHENOL 1.4 % MT LIQD: 1.0000 | OROMUCOSAL | Status: DC | PRN

## 2016-01-21 MED ORDER — HYDROCODONE-ACETAMINOPHEN 5-325 MG PO TABS
1.0000 | ORAL_TABLET | ORAL | Status: DC | PRN
  Administered 2016-01-21 – 2016-01-24 (×14): 2 via ORAL
  Filled 2016-01-21 (×12): qty 2

## 2016-01-21 MED ORDER — DEXMEDETOMIDINE BOLUS VIA INFUSION
1.0000 ug/kg | Freq: Once | INTRAVENOUS | Status: AC
  Administered 2016-01-21: 83.5 ug via INTRAVENOUS

## 2016-01-21 MED ORDER — METHOCARBAMOL 500 MG PO TABS
500.0000 mg | ORAL_TABLET | Freq: Four times a day (QID) | ORAL | Status: DC | PRN
  Administered 2016-01-21 – 2016-01-24 (×5): 500 mg via ORAL
  Filled 2016-01-21 (×4): qty 1

## 2016-01-21 MED ORDER — TOBRAMYCIN SULFATE 1.2 G IJ SOLR
INTRAMUSCULAR | Status: DC | PRN
  Administered 2016-01-21: 1.2 g

## 2016-01-21 MED ORDER — MEPERIDINE HCL 25 MG/ML IJ SOLN: 6.2500 mg | INTRAMUSCULAR | Status: DC | PRN

## 2016-01-21 SURGICAL SUPPLY — 81 items
BANDAGE ACE 4X5 VEL STRL LF (GAUZE/BANDAGES/DRESSINGS) ×3 IMPLANT
BANDAGE ACE 6X5 VEL STRL LF (GAUZE/BANDAGES/DRESSINGS) ×3 IMPLANT
BANDAGE ESMARK 6X9 LF (GAUZE/BANDAGES/DRESSINGS) ×1 IMPLANT
BLADE LONG MED 31MMX9MM (MISCELLANEOUS) ×1
BLADE LONG MED 31X9 (MISCELLANEOUS) ×2 IMPLANT
BLADE SAW SGTL 13.0X1.19X90.0M (BLADE) ×3 IMPLANT
BLADE SURG ROTATE 9660 (MISCELLANEOUS) IMPLANT
BNDG ELASTIC 6X10 VLCR STRL LF (GAUZE/BANDAGES/DRESSINGS) ×3 IMPLANT
BNDG ESMARK 6X9 LF (GAUZE/BANDAGES/DRESSINGS) ×3
BNDG GAUZE ELAST 4 BULKY (GAUZE/BANDAGES/DRESSINGS) ×3 IMPLANT
CEMENT HV SMART SET (Cement) ×9 IMPLANT
COVER BACK TABLE 24X17X13 BIG (DRAPES) IMPLANT
COVER SURGICAL LIGHT HANDLE (MISCELLANEOUS) ×3 IMPLANT
CUFF TOURNIQUET SINGLE 34IN LL (TOURNIQUET CUFF) ×3 IMPLANT
CUFF TOURNIQUET SINGLE 44IN (TOURNIQUET CUFF) IMPLANT
DRAPE HALF SHEET 40X57 (DRAPES) ×3 IMPLANT
DRAPE IMP U-DRAPE 54X76 (DRAPES) ×3 IMPLANT
DRAPE INCISE IOBAN 66X45 STRL (DRAPES) IMPLANT
DRAPE POUCH INSTRU U-SHP 10X18 (DRAPES) ×3 IMPLANT
DRSG ADAPTIC 3X8 NADH LF (GAUZE/BANDAGES/DRESSINGS) ×3 IMPLANT
DRSG PAD ABDOMINAL 8X10 ST (GAUZE/BANDAGES/DRESSINGS) ×9 IMPLANT
DURAPREP 26ML APPLICATOR (WOUND CARE) ×6 IMPLANT
ELECT REM PT RETURN 9FT ADLT (ELECTROSURGICAL) ×3
ELECTRODE REM PT RTRN 9FT ADLT (ELECTROSURGICAL) ×1 IMPLANT
EVACUATOR 1/8 PVC DRAIN (DRAIN) ×6 IMPLANT
FACESHIELD WRAPAROUND (MASK) ×6 IMPLANT
FILTER STRAW FLUID ASPIR (MISCELLANEOUS) ×3 IMPLANT
GAUZE SPONGE 4X4 12PLY STRL (GAUZE/BANDAGES/DRESSINGS) ×6 IMPLANT
GLOVE BIO SURGEON STRL SZ8.5 (GLOVE) ×3 IMPLANT
GLOVE BIOGEL PI IND STRL 8.5 (GLOVE) ×1 IMPLANT
GLOVE BIOGEL PI INDICATOR 8.5 (GLOVE) ×2
GLOVE SS BIOGEL STRL SZ 6.5 (GLOVE) ×1 IMPLANT
GLOVE SUPERSENSE BIOGEL SZ 6.5 (GLOVE) ×2
GOWN STRL REUS W/ TWL LRG LVL3 (GOWN DISPOSABLE) ×2 IMPLANT
GOWN STRL REUS W/ TWL XL LVL3 (GOWN DISPOSABLE) ×2 IMPLANT
GOWN STRL REUS W/TWL LRG LVL3 (GOWN DISPOSABLE) ×4
GOWN STRL REUS W/TWL XL LVL3 (GOWN DISPOSABLE) ×4
HANDPIECE INTERPULSE COAX TIP (DISPOSABLE)
IMMOBILIZER KNEE 22 UNIV (SOFTGOODS) IMPLANT
IMMOBILIZER KNEE 24 THIGH 36 (MISCELLANEOUS) ×1 IMPLANT
IMMOBILIZER KNEE 24 UNIV (MISCELLANEOUS) ×3
KIT BASIN OR (CUSTOM PROCEDURE TRAY) ×3 IMPLANT
KIT ROOM TURNOVER OR (KITS) ×3 IMPLANT
KIT STIMULAN RAPID CURE  10CC (Orthopedic Implant) ×2 IMPLANT
KIT STIMULAN RAPID CURE 10CC (Orthopedic Implant) ×1 IMPLANT
MANIFOLD NEPTUNE II (INSTRUMENTS) ×3 IMPLANT
MARKER SKIN DUAL TIP RULER LAB (MISCELLANEOUS) ×3 IMPLANT
MARKER SPHERE PSV REFLC THRD 5 (MARKER) ×9 IMPLANT
NEEDLE 18GX1X1/2 (RX/OR ONLY) (NEEDLE) ×3 IMPLANT
NS IRRIG 1000ML POUR BTL (IV SOLUTION) ×3 IMPLANT
PACK TOTAL JOINT (CUSTOM PROCEDURE TRAY) ×3 IMPLANT
PACK UNIVERSAL I (CUSTOM PROCEDURE TRAY) ×6 IMPLANT
PAD ARMBOARD 7.5X6 YLW CONV (MISCELLANEOUS) ×6 IMPLANT
PAD CAST 4YDX4 CTTN HI CHSV (CAST SUPPLIES) ×1 IMPLANT
PADDING CAST COTTON 4X4 STRL (CAST SUPPLIES) ×2
PIN SCHANZ 4MM 130MM (PIN) IMPLANT
SET HNDPC FAN SPRY TIP SCT (DISPOSABLE) IMPLANT
SPONGE LAP 18X18 X RAY DECT (DISPOSABLE) ×3 IMPLANT
SPONGE LAP 4X18 X RAY DECT (DISPOSABLE) IMPLANT
STAPLER VISISTAT 35W (STAPLE) ×6 IMPLANT
SUCTION FRAZIER HANDLE 10FR (MISCELLANEOUS) ×2
SUCTION TUBE FRAZIER 10FR DISP (MISCELLANEOUS) ×1 IMPLANT
SUT ETHILON 3 0 FSLX (SUTURE) IMPLANT
SUT VIC AB 0 CTB1 27 (SUTURE) IMPLANT
SUT VIC AB 1 CT1 27 (SUTURE) ×4
SUT VIC AB 1 CT1 27XBRD ANBCTR (SUTURE) ×2 IMPLANT
SUT VIC AB 1 CTB1 27 (SUTURE) ×9 IMPLANT
SUT VIC AB 1 CTX 36 (SUTURE) ×2
SUT VIC AB 1 CTX36XBRD ANBCTR (SUTURE) ×1 IMPLANT
SUT VIC AB 2-0 CTB1 (SUTURE) ×12 IMPLANT
SUT VLOC 180 0 24IN GS25 (SUTURE) ×3 IMPLANT
SWAB COLLECTION DEVICE MRSA (MISCELLANEOUS) ×3 IMPLANT
SWAB CULTURE ESWAB REG 1ML (MISCELLANEOUS) ×3 IMPLANT
SYR 5ML LL (SYRINGE) ×3 IMPLANT
TOWEL OR 17X24 6PK STRL BLUE (TOWEL DISPOSABLE) ×9 IMPLANT
TOWEL OR 17X26 10 PK STRL BLUE (TOWEL DISPOSABLE) ×3 IMPLANT
TOWER CARTRIDGE SMART MIX (DISPOSABLE) ×3 IMPLANT
TRAY CATH 16FR W/PLASTIC CATH (SET/KITS/TRAYS/PACK) IMPLANT
TRAY FOLEY CATH 16FRSI W/METER (SET/KITS/TRAYS/PACK) IMPLANT
WATER STERILE IRR 1000ML POUR (IV SOLUTION) IMPLANT
WRAP KNEE MAXI GEL POST OP (GAUZE/BANDAGES/DRESSINGS) ×3 IMPLANT

## 2016-01-21 NOTE — Progress Notes (Signed)
Anesthesia MD notified pt still complaining of 10/10 of pain.  MD will come to bedside as soon as available.  Will continue to monitor and notify for further changes.

## 2016-01-21 NOTE — Progress Notes (Signed)
Sent patient to OR, called short stay for report. Patient receiving IV antibiotics still short stay notified. Patient ready for procedure, pre-op checklist complete.

## 2016-01-21 NOTE — Interval H&P Note (Signed)
History and Physical Interval Note:  01/21/2016 3:31 PM  Juan Wiggins  has presented today for surgery, with the diagnosis of INFECTED RIGHT TOTAL KNEE ARTHROPLASTY  The various methods of treatment have been discussed with the patient and family. After consideration of risks, benefits and other options for treatment, the patient has consented to  Procedure(s): REMOVAL OF KNEE PROSTESIS WITH RIGHT KNEE PLACEMENT OF ANTIBIOTIC SPACER (Right) as a surgical intervention .  The patient's history has been reviewed, patient examined, no change in status, stable for surgery.  I have reviewed the patient's chart and labs.  Questions were answered to the patient's satisfaction.     Jahne Krukowski A

## 2016-01-21 NOTE — Brief Op Note (Signed)
01/14/2016 - 01/21/2016  6:26 PM  PATIENT:  Juan Wiggins  65 y.o. male  PRE-OPERATIVE DIAGNOSIS:  INFECTED RIGHT TOTAL KNEE ARTHROPLASTY secondary to an abscess which was caused by a Spioder Bite.  POST-OPERATIVE DIAGNOSIS:  INFECTED RIGHT TOTAL KNEE ARTHROPLASTYsecondary to an abscess which was caused by a Spider Bite.  PROCEDURE:  Procedure(s): REMOVAL OF KNEE PROSTESIS WITH RIGHT KNEE PLACEMENT OF ANTIBIOTIC SPACER (Right) and Antibiotic beads. Extensive Synovectomy of Right Knee.  SURGEON:  Surgeon(s) and Role:    * Latanya Maudlin, MD - Primary  PHYSICIAN ASSISTANT: Ardeen Jourdain PA  ASSISTANTS: Ardeen Jourdain PA  ANESTHESIA:   general  EBL:  Total I/O In: 1200 [I.V.:1200] Out: 50 [Blood:50]  BLOOD ADMINISTERED:none  DRAINS: (Right Knee) Hemovact drain(s) in the Right Knee with  (One) Hemovact drain(s) in the Right Knee with  Suction Open   LOCAL MEDICATIONS USED:  NONE  SPECIMEN:  Source of Specimen:  Culture of Right Knee Synovial Fluid.  DISPOSITION OF SPECIMEN:  PATHOLOGY  COUNTS:  YES  TOURNIQUET:   Total Tourniquet Time Documented: Thigh (Right) - 105 minutes Total: Thigh (Right) - 105 minutes   DICTATION: .Other Dictation: Dictation Number [id number]  PLAN OF CARE: Admit to inpatient   PATIENT DISPOSITION:  Stable in OR   Delay start of Pharmacological VTE agent (>24hrs) due to surgical blood loss or risk of bleeding: yes

## 2016-01-21 NOTE — Anesthesia Preprocedure Evaluation (Addendum)
Anesthesia Evaluation  Patient identified by MRN, date of birth, ID band Patient awake    Reviewed: Allergy & Precautions, NPO status , Patient's Chart, lab work & pertinent test results  Airway Mallampati: II       Dental no notable dental hx. (+) Teeth Intact   Pulmonary sleep apnea , former smoker,  Non compliant with CPAP   Pulmonary exam normal breath sounds clear to auscultation- rhonchi       Cardiovascular hypertension, + Peripheral Vascular Disease  Normal cardiovascular exam Rhythm:Regular Rate:Normal     Neuro/Psych Seizures -,  PSYCHIATRIC DISORDERS Anxiety Depression    GI/Hepatic GERD  Medicated and Controlled,(+)     substance abuse  alcohol use, Hepatitis -, B, CHx/o ETOH abuse- quit 2 years ago Hx/o ETOH pancreatitis   Endo/Other  Hypothyroidism   Renal/GU Renal InsufficiencyRenal disease  negative genitourinary   Musculoskeletal  (+) Arthritis , Osteoarthritis,  Infected Right total knee prosthesis   Abdominal   Peds  Hematology  (+) anemia ,   Anesthesia Other Findings   Reproductive/Obstetrics                             Lab Results  Component Value Date   WBC 12.1 (H) 01/21/2016   HGB 13.8 01/21/2016   HCT 40.5 01/21/2016   MCV 95.7 01/21/2016   PLT 365 01/21/2016     Chemistry      Component Value Date/Time   NA 138 01/21/2016 0442   K 3.9 01/21/2016 0442   CL 108 01/21/2016 0442   CO2 24 01/21/2016 0442   BUN 9 01/21/2016 0442   CREATININE 1.30 (H) 01/21/2016 0442   CREATININE 1.38 (H) 12/16/2015 1443      Component Value Date/Time   CALCIUM 8.7 (L) 01/21/2016 0442   ALKPHOS 123 01/19/2016 0137   AST 27 01/19/2016 0137   ALT <5 (L) 01/19/2016 0137   ALT 13 09/03/2015 1501   BILITOT 0.3 01/19/2016 0137     EKG: normal sinus rhythm, PAC's noted.  Echo 07/16/15: Left ventricle: The cavity size was normal. Wall thickness was   normal. Systolic  function was normal. The estimated ejection   fraction was in the range of 60% to 65%. Wall motion was normal;   there were no regional wall motion abnormalities. - Aortic valve: Mildly calcified annulus. - Pulmonary arteries: Systolic pressure was moderately increased.   PA peak pressure: 57 mm Hg (S).   Anesthesia Physical Anesthesia Plan  ASA: III  Anesthesia Plan: General   Post-op Pain Management:    Induction: Intravenous and Cricoid pressure planned  Airway Management Planned: Oral ETT  Additional Equipment:   Intra-op Plan:   Post-operative Plan: Extubation in OR  Informed Consent: I have reviewed the patients History and Physical, chart, labs and discussed the procedure including the risks, benefits and alternatives for the proposed anesthesia with the patient or authorized representative who has indicated his/her understanding and acceptance.   Dental advisory given  Plan Discussed with: Anesthesiologist, CRNA and Surgeon  Anesthesia Plan Comments:         Anesthesia Quick Evaluation

## 2016-01-21 NOTE — Clinical Social Work Note (Signed)
Clinical Social Work Assessment  Patient Details  Name: Juan Wiggins MRN: [id number] Date of Birth: 08/11/1950  Date of referral:  01/21/16               Reason for consult:  Facility Placement, Discharge Planning                Permission sought to share information with:    Permission granted to share information::  No  Name::        Agency::     Relationship::     Contact Information:     Housing/Transportation Living arrangements for the past 2 months:  Single Family Home Source of Information:  Patient, Medical Team Patient Interpreter Needed:  None Criminal Activity/Legal Involvement Pertinent to Current Situation/Hospitalization:  No - Comment as needed Significant Relationships:  Siblings, Parents Lives with:  Parents Do you feel safe going back to the place where you live?  Yes Need for family participation in patient care:  Yes (Comment)  Care giving concerns:  Patient in need of SNF for 6 weeks of IV antibiotics due to history of IVDU.   Social Worker assessment / plan:  CSW and RNCM met with patient. No supports at bedside. CSW introduced role and explained that discharge planning would be discussed. RNCM discussed issues with patient losing insurance on 11/30 and medication benefits. Patient currently not agreeable to SNF placement and is waiting on MD to discuss other options with him. CSW answered questions regarding placement. No further concerns. CSW encouraged patient to contact CSW as needed. CSW will continue to follow patient for support and facilitate discharge to SNF once medically stable, if still needed and he is agreeable.  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 Patient's sister supportive and  involved in patient's care. Patient appreciated social work intervention.  Patient/Family's Understanding of and Emotional Response to Diagnosis, Current Treatment, and Prognosis:  Patient understands possible need for SNF once discharged but is not agreeable as of right now. Patient appears happy with hospital care.  Emotional Assessment Appearance:  Appears stated age Attitude/Demeanor/Rapport:  Other (Pleasant) Affect (typically observed):  Appropriate, Calm, Pleasant Orientation:  Oriented to Self, Oriented to Place, Oriented to  Time, Oriented to Situation Alcohol / Substance use:  Alcohol Use, Illicit Drugs Psych involvement (Current and /or in the community):  No (Comment)  Discharge Needs  Concerns to be addressed:  Care Coordination, Discharge Planning Concerns, Medication Concerns Readmission within the last 30 days:  Yes Current discharge risk:  Substance Abuse Barriers to Discharge:  Active Substance Use, Inadequate or no insurance, Continued Medical Work up   Candie Chroman, LCSW 01/21/2016, 1:43 PM

## 2016-01-21 NOTE — NC FL2 (Signed)
[place] LEVEL OF CARE SCREENING TOOL     IDENTIFICATION  Patient Name: Juan Wiggins Birthdate: [birth year] Sex: male Admission Date (Current Location): 01/14/2016  [facility] and [place] Number:  Herbalist and Address:  The . [facility], [place] [address] [place], [place] [address]      Provider Number: [id number]  Attending Physician Name and Address:  Latanya Maudlin, MD  Relative Name and Phone Number:       Current Level of Care: Hospital Recommended Level of Care: [place] Prior Approval Number:    Date Approved/Denied:   PASRR Number: [id number]  Discharge Plan: SNF    Current Diagnoses: Patient Active Problem List   Diagnosis Date Noted  . Seizure ([place])   . IVDU (intravenous drug user)   . Staphylococcal arthritis of right knee ([place])   . Septic arthritis ([place]) 01/14/2016  . Lumbar spine pain   . Difficulty in walking, not elsewhere classified   . Infection of total knee replacement ([place]) 01/02/2016  . Pleural effusion   . Left upper quadrant pain   . Pain   . 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 Infection 12/29/2015  . Lactic acidosis   . Pyogenic arthritis of right knee joint ([place]) 07/18/2015  . Bacteremia due to Streptococcus 07/18/2015  . Cellulitis of right upper extremity 07/18/2015  . Chronic hepatitis C without hepatic coma ([place]) 07/18/2015  . Anemia 07/18/2015  . Opacity of lung on imaging study, bilateral 07/18/2015  . Anxiety state 07/18/2015  . History of total knee arthroplasty 07/16/2015  . Severe sepsis ([place]) 07/14/2015  . Moderate episode of recurrent major depressive disorder ([place])   . Chronic pain syndrome 05/21/2014  . Essential hypertension 08/28/2013  . GERD (gastroesophageal reflux disease) 08/28/2013  . Osteoarthritis of left knee 08/23/2013  . Total knee replacement status 08/23/2013  . Alcohol dependence ([place]) 07/14/2013  . Alcohol  dependency ([place]) 07/07/2013  . Major depression 07/07/2013  . Varicose veins of lower extremities with other complications [id number]  . Recurrent falls 04/14/2011  . Hypotension 04/13/2011  . ARF (acute renal failure) ([place]) 04/13/2011  . Hypothyroidism 04/13/2011  . Prolapsed and thrombosed 09/03/2010    Orientation RESPIRATION BLADDER Height & Weight     Self, Time, Situation, Place  Normal Continent Weight: 184 lb (83.5 kg) Height:  5' 11.5" (181.6 cm)  BEHAVIORAL SYMPTOMS/MOOD NEUROLOGICAL BOWEL NUTRITION STATUS   (None) Convulsions/Seizures Continent Diet (See discharge summary)  AMBULATORY STATUS COMMUNICATION OF NEEDS Skin   Supervision Verbally Surgical wounds                       Personal Care Assistance Level of Assistance  Bathing, Feeding, Dressing Bathing Assistance: Limited assistance Feeding assistance: Independent Dressing Assistance: Limited assistance     Functional Limitations Info  Sight, Hearing, Speech Sight Info: Adequate Hearing Info: Adequate Speech Info: Adequate    SPECIAL CARE FACTORS FREQUENCY  Blood pressure, PT (By licensed PT)     PT Frequency: 3 x week              Contractures Contractures Info: Not present    Additional Factors Info  Code Status, Allergies, Psychotropic Code Status Info: Full Allergies Info: Cefazolin, Heparin, Wellbutrin (Bupropion) Psychotropic Info: IVDU, Alcohol dependence, Major Depression: Lexapro 20 mg PO daily         Current Medications (01/21/2016):  This is the current hospital active medication list  Current Facility-Administered Medications  Medication Dose Route Frequency Provider Last Rate Last Dose  . acetaminophen (TYLENOL) tablet 650 mg  650 mg Oral Q6H PRN Latanya Maudlin, MD       Or  . acetaminophen (TYLENOL) suppository 650 mg  650 mg Rectal Q6H PRN Latanya Maudlin, MD      . albuterol (PROVENTIL) (2.5 MG/3ML) 0.083% nebulizer solution 3 mL  3 mL Inhalation Daily PRN Sela Hua, MD      . amLODipine (NORVASC) tablet 10 mg  10 mg Oral Daily [name], MD   10 mg at 01/21/16 0933  . aspirin EC tablet 325 mg  325 mg Oral Q breakfast Latanya Maudlin, MD   325 mg at 01/21/16 0933  . bisacodyl (DULCOLAX) EC tablet 5 mg  5 mg Oral Daily PRN Latanya Maudlin, MD      . DAPTOmycin (CUBICIN) 700 mg in sodium chloride 0.9 % IVPB  700 mg Intravenous Q24H Latanya Maudlin, MD   700 mg at 01/21/16 1334  . escitalopram (LEXAPRO) tablet 20 mg  20 mg Oral Daily Sela Hua, MD   20 mg at 01/21/16 0934  . ferrous sulfate tablet 325 mg  325 mg Oral TID PC Latanya Maudlin, MD   325 mg at 01/18/16 [id number]  . hydrALAZINE (APRESOLINE) injection 5 mg  5 mg Intravenous Q4H PRN Katheren Shams, DO      . HYDROcodone-acetaminophen (NORCO/VICODIN) 5-325 MG per tablet 1-2 tablet  1-2 tablet Oral Q4H PRN Latanya Maudlin, MD   2 tablet at 01/20/16 1748  . HYDROmorphone (DILAUDID) injection 1 mg  1 mg Intravenous Q2H PRN Latanya Maudlin, MD      . hydrOXYzine (ATARAX/VISTARIL) tablet 25 mg  25 mg Oral TID PRN Sela Hua, MD      . ketotifen (ZADITOR) 0.025 % ophthalmic solution 1 drop  1 drop Both Eyes BID Veatrice Bourbon, MD   1 drop at 01/21/16 0938  . lactated ringers infusion   Intravenous Continuous Latanya Maudlin, MD      . lactated ringers infusion   Intravenous Continuous Latanya Maudlin, MD      . levothyroxine (SYNTHROID, LEVOTHROID) tablet 200 mcg  200 mcg Oral QAC breakfast Sela Hua, MD   200 mcg at 01/21/16 0934  . LORazepam (ATIVAN) injection 2 mg  2 mg Intravenous PRN Veatrice Bourbon, MD      . menthol-cetylpyridinium (CEPACOL) lozenge 3 mg  1 lozenge Oral PRN Latanya Maudlin, MD       Or  . phenol (CHLORASEPTIC) mouth spray 1 spray  1 spray Mouth/Throat PRN Latanya Maudlin, MD      . methocarbamol (ROBAXIN) tablet 500 mg  500 mg Oral Q6H PRN Latanya Maudlin, MD       Or  . methocarbamol (ROBAXIN) 500 mg in dextrose 5 % 50 mL IVPB  500 mg Intravenous Q6H PRN Latanya Maudlin, MD       . methocarbamol (ROBAXIN) tablet 500 mg  500 mg Oral Q6H PRN Sela Hua, MD   500 mg at 01/16/16 0540  . metoCLOPramide (REGLAN) tablet 5-10 mg  5-10 mg Oral Q8H PRN Latanya Maudlin, MD   10 mg at 01/19/16 2323   Or  . metoCLOPramide (REGLAN) injection 5-10 mg  5-10 mg Intravenous Q8H PRN Latanya Maudlin, MD   10 mg at 01/16/16 0956  . ondansetron (ZOFRAN) tablet 4 mg  4 mg Oral Q6H PRN Latanya Maudlin, MD   4 mg at 01/20/16  [id number]   Or  . ondansetron (ZOFRAN) injection 4 mg  4 mg Intravenous Q6H PRN Latanya Maudlin, MD   4 mg at 01/19/16 1759  . polyethylene glycol (MIRALAX / GLYCOLAX) packet 17 g  17 g Oral Daily PRN Latanya Maudlin, MD   17 g at 01/18/16 0948  . tamsulosin (FLOMAX) capsule 0.4 mg  0.4 mg Oral QPC breakfast Sela Hua, MD   0.4 mg at 01/21/16 0933  . tenofovir (VIREAD) tablet 300 mg  300 mg Oral Daily [name], MD   300 mg at 01/21/16 0933  . zolpidem (AMBIEN) tablet 5 mg  5 mg Oral QHS PRN Sela Hua, MD   5 mg at 01/20/16 2317     Discharge Medications: Please see discharge summary for a list of discharge medications.  Relevant Imaging Results:  Relevant Lab Results:   Additional Information SS#: [id number]. Patient will need 6 weeks of IV Cubicin. Insurance expires on 11/30 and will likely need 30-day LOG/DTP.  Candie Chroman, LCSW

## 2016-01-21 NOTE — Clinical Social Work Placement (Signed)
   CLINICAL SOCIAL WORK PLACEMENT  NOTE  Date:  01/21/2016  Patient Details  Name: Juan Wiggins MRN: [id number] Date of Birth: [birth year]  Clinical Social Work is seeking post-discharge placement for this patient at the [place] level of care (*CSW will initial, date and re-position this form in  chart as items are completed):  No (Patient currently refusing SNF.)   Patient/family provided with [place] Work Department's list of facilities offering this level of care within the geographic area requested by the patient (or if unable, by the patient's family).  No (Options limited due to history of IVDU, need for IV abx for 6 weeks, and insurance expiring on 11/30.)   Patient/family informed of their freedom to choose among providers that offer the needed level of care, that participate in Medicare, Medicaid or managed care program needed by the patient, have an available bed and are willing to accept the patient.  Yes   Patient/family informed of [facility]'s ownership interest in [facility] and [facility], as well as of the fact that they are under no obligation to receive care at these facilities.  PASRR submitted to EDS on 01/21/16     PASRR number received on       Existing PASRR number confirmed on 01/21/16     FL2 transmitted to all facilities in geographic area requested by pt/family on       FL2 transmitted to all facilities within larger geographic area on       Patient informed that his/her managed care company has contracts with or will negotiate with certain facilities, including the following:            Patient/family informed of bed offers received.  Patient chooses bed at       Physician recommends and patient chooses bed at      Patient to be transferred to   on  .  Patient to be transferred to facility by       Patient family notified on   of transfer.  Name of family member notified:        PHYSICIAN Please  sign FL2     Additional Comment:    _______________________________________________ Candie Chroman, LCSW 01/21/2016, 2:00 PM

## 2016-01-21 NOTE — Anesthesia Postprocedure Evaluation (Signed)
Anesthesia Post Note  Patient: Juan Wiggins  Procedure(s) Performed: Procedure(s) (LRB): REMOVAL OF KNEE PROSTESIS WITH RIGHT KNEE PLACEMENT OF ANTIBIOTIC SPACER (Right)  Patient location during evaluation: PACU Anesthesia Type: General Level of consciousness: awake, awake and alert and oriented Pain management: pain level controlled Respiratory status: spontaneous breathing, nonlabored ventilation and respiratory function stable Cardiovascular status: blood pressure returned to baseline Anesthetic complications: no    Last Vitals:  Vitals:   01/21/16 2030 01/21/16 2114  BP:  136/66  Pulse: (!) 52 (!) 55  Resp: 16 17  Temp: 36.9 C 36.7 C    Last Pain:  Vitals:   01/21/16 2215  TempSrc:   PainSc: 10-Worst pain ever                 Andreal Vultaggio COKER

## 2016-01-21 NOTE — Transfer of Care (Signed)
Immediate Anesthesia Transfer of Care Note  Patient: Juan Wiggins  Procedure(s) Performed: Procedure(s): REMOVAL OF KNEE PROSTESIS WITH RIGHT KNEE PLACEMENT OF ANTIBIOTIC SPACER (Right)  Patient Location: PACU  Anesthesia Type:General  Level of Consciousness: awake and alert   Airway & Oxygen Therapy: Patient Spontanous Breathing and Patient connected to face mask oxygen  Post-op Assessment: Report given to RN and Post -op Vital signs reviewed and stable  Post vital signs: Reviewed and stable  Last Vitals:  Vitals:   01/21/16 1309 01/21/16 1855  BP: 135/64   Pulse: (!) 59   Resp:    Temp: 37.2 C 36.3 C    Last Pain:  Vitals:   01/21/16 1510  TempSrc:   PainSc: 4       Patients Stated Pain Goal: 4 ([id number])  Complications: No apparent anesthesia complications

## 2016-01-21 NOTE — Progress Notes (Signed)
Neurology Progress Note  Subjective: Chart reviewed. He has not had any further seizure activity. He has no specific neurologic complaints. He has some pain in the RLE from the knee radiating into the foot. He has been eating and sleeping well. ROS otherwise unremarkable.   Current Meds:   Current Facility-Administered Medications:  .  acetaminophen (TYLENOL) tablet 650 mg, 650 mg, Oral, Q6H PRN **OR** acetaminophen (TYLENOL) suppository 650 mg, 650 mg, Rectal, Q6H PRN, Latanya Maudlin, MD .  albuterol (PROVENTIL) (2.5 MG/3ML) 0.083% nebulizer solution 3 mL, 3 mL, Inhalation, Daily PRN, Sela Hua, MD .  amLODipine (NORVASC) tablet 10 mg, 10 mg, Oral, Daily, [name], MD, 10 mg at 01/21/16 0933 .  aspirin EC tablet 325 mg, 325 mg, Oral, Q breakfast, Latanya Maudlin, MD, 325 mg at 01/21/16 0933 .  bisacodyl (DULCOLAX) EC tablet 5 mg, 5 mg, Oral, Daily PRN, Latanya Maudlin, MD .  DAPTOmycin (CUBICIN) 700 mg in sodium chloride 0.9 % IVPB, 700 mg, Intravenous, Q24H, Latanya Maudlin, MD, 700 mg at 01/20/16 1245 .  escitalopram (LEXAPRO) tablet 20 mg, 20 mg, Oral, Daily, Sela Hua, MD, 20 mg at 01/21/16 0934 .  ferrous sulfate tablet 325 mg, 325 mg, Oral, TID PC, Latanya Maudlin, MD, 325 mg at 01/18/16 [id number] .  hydrALAZINE (APRESOLINE) injection 5 mg, 5 mg, Intravenous, Q4H PRN, Katheren Shams, DO .  HYDROcodone-acetaminophen (NORCO/VICODIN) 5-325 MG per tablet 1-2 tablet, 1-2 tablet, Oral, Q4H PRN, Latanya Maudlin, MD, 2 tablet at 01/20/16 1748 .  HYDROmorphone (DILAUDID) injection 1 mg, 1 mg, Intravenous, Q2H PRN, Latanya Maudlin, MD, 1 mg at 01/21/16 0935 .  hydrOXYzine (ATARAX/VISTARIL) tablet 25 mg, 25 mg, Oral, TID PRN, Sela Hua, MD .  ketotifen (ZADITOR) 0.025 % ophthalmic solution 1 drop, 1 drop, Both Eyes, BID, Veatrice Bourbon, MD, 1 drop at 01/21/16 0938 .  lactated ringers infusion, , Intravenous, Continuous, Latanya Maudlin, MD .  lactated ringers infusion, , Intravenous,  Continuous, Latanya Maudlin, MD .  levothyroxine (SYNTHROID, LEVOTHROID) tablet 200 mcg, 200 mcg, Oral, QAC breakfast, Sela Hua, MD, 200 mcg at 01/21/16 0934 .  LORazepam (ATIVAN) injection 2 mg, 2 mg, Intravenous, PRN, Alyssa A Haney, MD .  menthol-cetylpyridinium (CEPACOL) lozenge 3 mg, 1 lozenge, Oral, PRN **OR** phenol (CHLORASEPTIC) mouth spray 1 spray, 1 spray, Mouth/Throat, PRN, Latanya Maudlin, MD .  methocarbamol (ROBAXIN) tablet 500 mg, 500 mg, Oral, Q6H PRN **OR** methocarbamol (ROBAXIN) 500 mg in dextrose 5 % 50 mL IVPB, 500 mg, Intravenous, Q6H PRN, Latanya Maudlin, MD .  methocarbamol (ROBAXIN) tablet 500 mg, 500 mg, Oral, Q6H PRN, Sela Hua, MD, 500 mg at 01/16/16 0540 .  metoCLOPramide (REGLAN) tablet 5-10 mg, 5-10 mg, Oral, Q8H PRN, 10 mg at 01/19/16 2323 **OR** metoCLOPramide (REGLAN) injection 5-10 mg, 5-10 mg, Intravenous, Q8H PRN, Latanya Maudlin, MD, 10 mg at 01/16/16 0956 .  ondansetron (ZOFRAN) tablet 4 mg, 4 mg, Oral, Q6H PRN, 4 mg at 01/20/16 0337 **OR** ondansetron (ZOFRAN) injection 4 mg, 4 mg, Intravenous, Q6H PRN, Latanya Maudlin, MD, 4 mg at 01/19/16 1759 .  polyethylene glycol (MIRALAX / GLYCOLAX) packet 17 g, 17 g, Oral, Daily PRN, Latanya Maudlin, MD, 17 g at 01/18/16 0948 .  tamsulosin (FLOMAX) capsule 0.4 mg, 0.4 mg, Oral, QPC breakfast, Sela Hua, MD, 0.4 mg at 01/21/16 0933 .  tenofovir (VIREAD) tablet 300 mg, 300 mg, Oral, Daily, [name], MD, 300 mg at 01/21/16 0933 .  zolpidem (AMBIEN) tablet 5 mg, 5  mg, Oral, QHS PRN, Sela Hua, MD, 5 mg at 01/20/16 2317  Objective:  Temp:  [98.1 F (36.7 C)-99.3 F (37.4 C)] 98.1 F (36.7 C) (11/28 0700) Pulse Rate:  [63-70] 65 (11/28 0700) Resp:  [11-15] 15 (11/28 0700) BP: (136-151)/(69-86) 146/69 (11/28 0700) SpO2:  [94 %-99 %] 98 % (11/28 0700)  General: WDWN Caucasian man lying in bed in NAD. Alert, oriented x4. Speech is clear without dysarthria. Affect is bright. Comportment is normal.  Spontaneous recall is 2/3 items at five minutes. He easily gets the third item with a semantic cue.  HEENT: Neck is supple without lymphadenopathy. Mucous membranes are moist and the oropharynx is clear. Sclerae are anicteric. There is no conjunctival injection.  CV: Regular, no murmur. Carotid pulses are 2+ and symmetric with no bruits. Distal pulses 2+ and symmetric.  Lungs: CTAB  Extremities: No C/C/E. Neuro: MS: As noted above. No aphasia.  CN: Pupils are equal and reactive from 3-->2 mm bilaterally. EOMI, no nystagmus. Facial sensation is intact to light touch. Face is symmetric at rest with normal strength and mobility. Hearing is intact to conversational voice. Voice is normal in tone and quality. Palate elevates symmetrically. Uvula is midline. Bilateral SCM and trapezii are 5/5. Tongue is midline with normal bulk and mobility.  Motor: Normal bulk, tone, and strength throughout. R knee not tested 2/2 pain. No pronator drift. No tremor or other abnormal movements are observed.  Sensation: Intact to light touch.  DTRs: 2+, symmetric. R knee not tested. Toes are downgoing bilaterally. No pathological reflexes.  Coordination: Finger-to-nose without dysmetria bilaterally.    Labs: Lab Results  Component Value Date   WBC 12.1 (H) 01/21/2016   HGB 13.8 01/21/2016   HCT 40.5 01/21/2016   PLT 365 01/21/2016   GLUCOSE 90 01/21/2016   TRIG 88 05/22/2014   ALT <5 (L) 01/19/2016   AST 27 01/19/2016   NA 138 01/21/2016   K 3.9 01/21/2016   CL 108 01/21/2016   CREATININE 1.30 (H) 01/21/2016   BUN 9 01/21/2016   CO2 24 01/21/2016   TSH 6.303 (H) 01/17/2016   INR 1.27 12/29/2015   CBC Latest Ref Rng & Units 01/21/2016 01/20/2016 01/19/2016  WBC 4.0 - 10.5 K/uL 12.1(H) 10.1 9.5  Hemoglobin 13.0 - 17.0 g/dL 13.8 13.2 12.5(L)  Hematocrit 39.0 - 52.0 % 40.5 38.8(L) 37.1(L)  Platelets 150 - 400 K/uL 365 359 345    No results found for: HGBA1C Lab Results  Component Value Date   ALT <5  (L) 01/19/2016   AST 27 01/19/2016   ALKPHOS 123 01/19/2016   BILITOT 0.3 01/19/2016    Radiology: There is no new neuroimaging for review.   A/P:   1. First-time seizure: Available history suggests GTC seizure. I would posit that this is, indeed, a provoked seizure. He has no prior history of seizure and no clear historical risk factors for epilepsy. Cephalosporins can lower seizure threshold so it is possible that this was provoked by the use of IV cefazolin; this has been discontinued and he is presently on daptomycin. He has a known history of cocaine abuse but no UDS was performed at the time of his seizure. MRI does not show any structural lesions that would be expected to predispose to seizure. EEG is normal. There is no role for AED therapy at this time.   Seizure precautions were discussed with the patient. Per [facility] statutes, patients with seizures are not allowed to drive until they  have been seizure-free for six months. Use caution when using heavy equipment or power tools. Avoid working on ladders or at heights. Take showers instead of baths. Ensure the water temperature is not too high on the home water heater. Do not go swimming alone. He verbalizes understanding.   This was discussed with the patient and he is in agreement with the plan as noted. He was given the opportunity to ask questions and these were addressed to his satisfaction.   I have no further recommendations at this time and will sign off. Please call if there are any new questions or concerns.   Melba Coon, MD [facility]

## 2016-01-21 NOTE — Progress Notes (Signed)
FMTS Attending Daily Note:   S:  Patient doing well, lying in bed, no complaints.   O: Gen:  Alert, cooperative patient who appears stated age in no acute distress.  Vital signs reviewed. Heart:  RRR Lungs:  Clear Abd:  Soft/NT Ext:  Right leg WNL.  Left leg with chlorhexidine staining.  Trace edema at ankle.  Healed midline surgical scar over patella.  Some heat noted. Neuro:  Awake, alert, fully oriented.  Imp/Plan: 1.  Right knee septic joint staph infection/infected hardware: - plan for surgical removal of hardware later today - appreciate both ortho and ID input - plan will be long-acting abx to cover for infection  2.  Seizures: - mental status is good. - negative EEG yesterday - no further seizures.  Seems to be secondary to medication use/lowered seizure threshold.  - appreciate neuro input.  3.  AKI:  - improving.    4.  Hepatitis: - both B and C - treated and followed by ID  Alveda Reasons, MD 01/21/2016 9:30 AM

## 2016-01-21 NOTE — Anesthesia Procedure Notes (Signed)
Procedure Name: Intubation Date/Time: 01/21/2016 4:15 PM Performed by: Maude Leriche D Pre-anesthesia Checklist: Patient identified, Emergency Drugs available, Suction available, Patient being monitored and Timeout performed Patient Re-evaluated:Patient Re-evaluated prior to inductionOxygen Delivery Method: Circle system utilized Preoxygenation: Pre-oxygenation with 100% oxygen Intubation Type: IV induction Ventilation: Mask ventilation without difficulty Laryngoscope Size: Mac and 3 Grade View: Grade II Tube type: Oral Tube size: 7.5 mm Number of attempts: 1 Airway Equipment and Method: Stylet Placement Confirmation: ETT inserted through vocal cords under direct vision,  positive ETCO2 and breath sounds checked- equal and bilateral Secured at: 22 cm Tube secured with: Tape Dental Injury: Teeth and Oropharynx as per pre-operative assessment

## 2016-01-21 NOTE — Progress Notes (Signed)
Anesthesia MD at bedside OK to transport pt to room in 15 minutes.  Pt still reports 10/10 of pain but easily falls asleep.  Will continue to monitor and notify for changes.

## 2016-01-21 NOTE — Care Management Note (Signed)
Case Management Note  Patient Details  Name: Juan Wiggins MRN: [id number] Date of Birth: 1950/04/30  Subjective/Objective:      Presents with septic arthritis, 5 days post op arthroscopic washout ,  Hx of IV drug abuse,per pt eval rec hhpt for patient he states he has had [facility] in the past when he had a knee replacement, but he feels like he does not need pt, and states he does not want pt.  He will need long term IV ABX , HH will not be an option for patient.  Patient lives with his father and his father's care taker, he has a quad cane in the room and 2 more canes at home , a rolling walker, a wheelchair, and cructches. He has medication coverage thru 11/30 and then he will not have any coverage,he has a pcp , and he has transportation at dc.   Patient will receive one time oritavancin which will stay in his system for 2 weeks and then will get another long acting dalbavancin followed by po vs using bioavaiable po option such as zyvox, per Dr. Tommy Medal note.  NCM will cont to follow for dc needs     11/28 12:49 Tomi Bamberger RN ,BSN - Patient discussed in [place] 11/28.  Patient will need to go to SNF to receive long term iv abx per Med Director.  NCM informed Dr. Baxter Flattery of this information.  She states she will see if he can be on zyvox.  NCM called patient assist for zyvox at [phone] , spoke with Mervyn Gay, patient gave consent to give information and patient also spoke with Mervyn Gay to give house hold income.  Mervyn Gay states he definitely meets criteria for assistance, but we will need to call back before he is discharged to get the assistance amount for the zyvox or call back when insurance has expired, insurance expires 11/30  They have him in system already.  NCM and CSW spoke with patient and informed him he would need to go to snf if the other option does not work that Dr. Baxter Flattery is working on.  He seemed to be ok , as he see we are trying other options also.              Action/Plan:   Expected  Discharge Date:                  Expected Discharge Plan:  Skilled Nursing Facility  In-House Referral:  Clinical Social Work  Discharge planning Services  CM Consult  Post Acute Care Choice:  NA Choice offered to:     DME Arranged:  N/A DME Agency:  NA  HH Arranged:  NA HH Agency:  NA  Status of Service:  In process, will continue to follow  If discussed at Long Length of Stay Meetings, dates discussed:    Additional Comments:  Zenon Mayo, RN 01/21/2016, 12:46 PM

## 2016-01-21 NOTE — Progress Notes (Signed)
[facility]    Date of Admission:  01/14/2016   Total days of antibiotics 8        Day dapto 3        Day QOD tenofovir        Daily sofosbuvir-velpatasvir           ID: Juan Wiggins is a 65 y.o. male with   Active Problems:   Infection of total knee replacement (HCC)   Septic arthritis ([place])   Staphylococcal arthritis of right knee ([place])   IVDU (intravenous drug user)   Seizure ([place])    Subjective: Afebrile, npo for surgery this afternoon  Medications:  . amLODipine  10 mg Oral Daily  . aspirin EC  325 mg Oral Q breakfast  . DAPTOmycin (CUBICIN)  IV  700 mg Intravenous Q24H  . escitalopram  20 mg Oral Daily  . ferrous sulfate  325 mg Oral TID PC  . ketotifen  1 drop Both Eyes BID  . levothyroxine  200 mcg Oral QAC breakfast  . tamsulosin  0.4 mg Oral QPC breakfast  . tenofovir  300 mg Oral Daily    Objective: Vital signs in last 24 hours: Temp:  [98.1 F (36.7 C)-99.3 F (37.4 C)] 98.5 F (36.9 C) (11/28 1041) Pulse Rate:  [64-95] 95 (11/28 1041) Resp:  [11-15] 13 (11/28 1041) BP: (142-151)/(69-86) 151/83 (11/28 1041) SpO2:  [94 %-99 %] 96 % (11/28 1041)  Did not examine today  Lab Results  Recent Labs  01/20/16 0423 01/21/16 0442  WBC 10.1 12.1*  HGB 13.2 13.8  HCT 38.8* 40.5  NA 139 138  K 4.2 3.9  CL 108 108  CO2 25 24  BUN 8 9  CREATININE 1.47* 1.30*   Liver Panel  Recent Labs  01/19/16 0137  PROT 7.5  ALBUMIN 2.4*  AST 27  ALT <5*  ALKPHOS 123  BILITOT 0.3   Sedimentation Rate No results for input(s): ESRSEDRATE in the last 72 hours. C-Reactive Protein No results for input(s): CRP in the last 72 hours.  Microbiology: 11/21 aerobic cx  - staph aureus 11/22 synovial fluid cx - staph aureus Studies/Results: No results found.   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 He was  discontinued from cefazolin. His most recent cx is in result of him having lapse of access to his abtx after his I x D and poly-exchange on 11/10. The plan for his is to undergo 2 staged revision. He is planned to go to the OR this afternoon for explantation of his TKA and placement of abtx spacer. Continue on daptomycin until we have a firm discharge date.  Ideally, would give him daily IV therapy but he does not want to go to a SNF and risk for picc line misuse. Treatment options are limited given his medical problems and possible loss of insurance.  - will avoid FQ due to risk of lower seizure threshold per neurology request, which limits [place] further   - linezolid is a potential option which is oral, but difficult to sustain for 6 wk due to side effects. We have sent in RX for 30day supply  - additional option though not commonly used, to do a dose of oritavancin which would give you 10-14 day anti-staphylococcal coverage then, place remaining period of linezolid   Unprovoked seizure = no seizure pattern on EEG thus no  need for AED at this time  Chronic hepatitis C without hepatic coma = nearly finished with his treatment course. For now, we will avoid placing on rifampin due to drug interaction with curing hep c infection. Once he finishes his hep C treatment, roughly on December 1st,   Chronic hepatitis B = renal function has improved and we will switch to daily dosing. His hep b DNA viral load much improved in October. He initially had hep B flare when he started his hep C treatment. Will check hep B viral load.  [facility] Cell: [phone] Pager: [phone]  01/21/2016, 11:31 AM

## 2016-01-21 NOTE — Telephone Encounter (Signed)
His insurance are about to be expired. He has gotten  3 months of epclusa. He is also currently on Viread for his hep B. Called his pharmacy [name] to ship out his TDF before the insurance is expired. Pharmacy said that he should received it by tomorrow. He is currently in the hospital right now.

## 2016-01-21 NOTE — Progress Notes (Signed)
Patient arrived from 3S to 6n, alert and oriented, moderate pain. Planned for OR at 3:10. Oriented to room and staff. No complaints at this time.

## 2016-01-21 NOTE — Progress Notes (Signed)
Family Medicine Teaching Service Daily Progress Note Intern Pager: [phone]  Patient name: Juan Wiggins Medical record number: [id number] Date of birth: [birth year] Age: 65 y.o. Gender: male  Primary Care Provider: Helane Rima, MD Consultants:  Ortho, ID Code Status: Full  Pt Overview and Major Events to Date:  11/22 - Admitted for R septic knee (re-admission), joint aspiration by ortho 11/23 - Surgical I&D R knee by orthopedics 11/26 - seizure overnight, uprovoked, no repeat episodes 11/28 plan for R knee prosthesis removal and placement of antibiotic beads by ortho  Assessment and Plan: Juan Wiggins a 65 y.o.malepresenting with right knee pain presenting with septic joint, readmit, has been off of antibiotics 7 days. PMH is significant for hypothyroidism, alcohol abuse, Hepatitis B, Hepatitis C, HTN, GERD, chronic pain syndrome, hx of TKA (both in 2015), hx of R septic knee (06/2015), major depression, anxiety, BPH.   # Right knee septic joint. s/p arthroscopy with irrigation and debridement x1, was last admitted 11/5 - 11/13, discharged with plans for home ceftriaxone IM presented s/p 7d without antibiotics. Leukocytosis resolved. Joint cultures from aspirate  growing few staph aureus. - s/p 5 days Ancef 2g q8h (11/21 >11/25, transitioned on day #6) - continue daptomycin day 3 (11/26 - )  - Synovial cultures pan-sensitive staph aureus - Plan per ID and ortho will be placement of antibiotic spacer followed by parenteral therapy. Options per ID includeoritavancin which gives 10-14d antistaphylococcal coverage followed by linezolid (oral)   - appreciate ID recs, ortho recs - Bcx no growth, final - pain control with dilaudid 1mg  q2hr and vicoden q4hr prn.  - PT/OT  # Unprovoked Seizure, one occurrence -  Patient had witnessed tonic-clonic seizure-like activity 11/26, no hx of seizures, appears to be unprovoked. Patient previously on cephalosporin which may lower seizure  threshold so this medication was transitioned to daptomycin as above. - EEG - no evidence of an epileptic focus that would suggest high likelihood of persistent or recurrent seizure. - can transition back to med-surg from stepdown - appreciate neurology recs - transitioned to daptomycin per ID recs  # AKI, improving Cr 2.36 >>1.30 (baseline 1.16). Likely prerenal, improving every day. - will recheck BMP tomorrow AM - avoid nephrotoxic agents  # RLQ pain, thought to be pain radiating from R knee or from back pain. No signs of UTI or abdominal process. Abdominal exam benign. +2 episodes diarrhea over past day, describes black stool. - will monitor - FOBT pending - Hgb stable 12.1 - can consider pelvic/scrotol [place]  # history of substance abuse/UDS positive for cocaine/history of EtOH use. - patient denies alcohol or drug use at this time. History of alcohol use, he states 2 years ago, however was treated 1 year ago per chart review. +cocaine on his three most recent urine drug screens, though patient denies use. - repeat UDS with only benzos and opiods - monitor on CIWA, 1, 0, 9, 5 overnight  Scored highly disorientation, also anxiety and paroxysmal sweats  # Hypertension, improved. BP elevated at 151/86 this morning.Home propanolol recently dc'd due to cocaine use.   - continue 10 mg amlodipine daily - monitor BP - hydralazine prn for elevated pressures  #Hepatitis B, chronic and active.At home on Viread 300 mg daily.  - Continue home Viread 300 mg dosed by pharmacy every other day due to Cr Cl - Monitor  #Hepatitis C, chronic and active. At home on Sofosbuvir-Velpatasvir [name]).  - Continue home Epclusa 400-100 mg po tab daily  -  Monitor  #Hypothyroidism. Stable. At home on Levothyroxine. Previously TSH 0.20 on last admission with 200 mcg synthroid daily however dose not changed given acute illness.  Repeat this admission TSH 6.303, fT4 WNL at 1.07.   - Continue home  Synthroid 200 mcg daily - will need recheck TSH in 6 weeks   #difficulty sleeping. At home on Ambien 10 mg at bedtime.  - Ambien 5 mg PRN  #Depression . Stable. At home on Lexapro and Remeron. Also on Ambien 10mg  PRN  -Continue Lexapro 20 mg daily at bedtime   #BPH:On Silodosin 8mg  daily at home - flomax 0.4 mg Qd while hospitalized  #Chronic lumbar spine pain - stable. No neurologic deficits. - monitor  # ?Asthma - not indicated on problem list however albuterol on med list. - PRN albuterol  - Will follow up whether pt uses this  FEN/GI: KVO, Regular diet but NPO for procedure today  Disposition: Awaiting further work-up. Plan to discharge to SNF with PICC if patient consents  Subjective:  Patient anxious this morning, tired of being hospitalized.  6/10 knee pain, diarrhea improved (only one episode yesterday).  NPO today for orthoscopic intervention. No N/V. No acute events overnight.  Objective: Temp:  [98.1 F (36.7 C)-99.3 F (37.4 C)] 98.1 F (36.7 C) (11/28 0700) Pulse Rate:  [63-70] 65 (11/28 0700) Resp:  [11-15] 15 (11/28 0700) BP: (136-151)/(69-86) 146/69 (11/28 0700) SpO2:  [94 %-99 %] 98 % (11/28 0700) Physical Exam: General: NAD, lies comfortably in bed, a little bit restless this morning Cardiovascular: RRR, no m/r/g Respiratory: CTA bil, no W/R/R Abdomen: soft, nontender this AM, nd, normoactive BS Extremities:+right knee edematous with sutures in place, no erythema or streaking Neuro: CN II-XII  intact  Laboratory:  Recent Labs Lab 01/19/16 0358 01/20/16 0423 01/21/16 0442  WBC 9.5 10.1 12.1*  HGB 12.5* 13.2 13.8  HCT 37.1* 38.8* 40.5  PLT 345 359 365    Recent Labs Lab 01/19/16 0137 01/19/16 0358 01/20/16 0423 01/21/16 0442  NA 136 138 139 138  K 3.8 3.8 4.2 3.9  CL 105 105 108 108  CO2 24 25 25 24   BUN 9 8 8 9   CREATININE 1.57* 1.52* 1.47* 1.30*  CALCIUM 8.5* 8.6* 8.6* 8.7*  PROT 7.5  --   --   --   BILITOT 0.3  --   --    --   ALKPHOS 123  --   --   --   ALT <5*  --   --   --   AST 27  --   --   --   GLUCOSE 105* 99 108* 90    Imaging/Diagnostic Tests:  Dg Knee 2 Views Right: 01/14/2016 IMPRESSION: RIGHT knee prosthesis without acute complication. Soft tissue swelling RIGHT knee. Electronically Signed   By: Lavonia Dana M.D.   On: 01/14/2016 12:39   EEG 01/20/2016 Impression: This is an essentially normal awake and drowsy EEG. The presence of reduced voltages and intermixed beta activity is most commonly seen with anxiety and the administration of benzodiazepines. Clinical correlation is required. There is no evidence of an epileptic focus that would suggest high likelihood of persistent or recurrent seizure.   [name], MD 01/21/2016, 9:41 AM PGY-1, [place] Intern pager: [phone], text pages welcome

## 2016-01-22 LAB — CBC
HCT: 32.4 % — ABNORMAL LOW (ref 39.0–52.0)
HEMOGLOBIN: 10.7 g/dL — AB (ref 13.0–17.0)
MCH: 31.8 pg (ref 26.0–34.0)
MCHC: 33 g/dL (ref 30.0–36.0)
MCV: 96.1 fL (ref 78.0–100.0)
Platelets: 306 10*3/uL (ref 150–400)
RBC: 3.37 MIL/uL — AB (ref 4.22–5.81)
RDW: 14.3 % (ref 11.5–15.5)
WBC: 11.8 10*3/uL — ABNORMAL HIGH (ref 4.0–10.5)

## 2016-01-22 LAB — BASIC METABOLIC PANEL
Anion gap: 7 (ref 5–15)
BUN: 10 mg/dL (ref 6–20)
CHLORIDE: 104 mmol/L (ref 101–111)
CO2: 26 mmol/L (ref 22–32)
CREATININE: 1.35 mg/dL — AB (ref 0.61–1.24)
Calcium: 9.1 mg/dL (ref 8.9–10.3)
GFR calc Af Amer: >60 mL/min (ref >60)
GFR calc non Af Amer: 54 mL/min — ABNORMAL LOW (ref >60)
GLUCOSE: 128 mg/dL — AB (ref 65–99)
POTASSIUM: 5.2 mmol/L — AB (ref 3.5–5.1)
SODIUM: 137 mmol/L (ref 135–145)

## 2016-01-22 MED ORDER — HYDROMORPHONE HCL 2 MG/ML IJ SOLN
2.0000 mg | INTRAMUSCULAR | Status: DC | PRN
  Administered 2016-01-22 – 2016-01-24 (×17): 2 mg via INTRAVENOUS
  Filled 2016-01-22 (×17): qty 1

## 2016-01-22 NOTE — Progress Notes (Signed)
Family Medicine Teaching Service Daily Progress Note Intern Pager: [phone]  Patient name: Juan Wiggins Medical record number: [id number] Date of birth: 08-Jun-1950 Age: 65 y.o. Gender: male  Primary Care Provider: Helane Rima, MD Consultants:  Ortho, ID Code Status: Full  Pt Overview and Major Events to Date:  11/22 - Admitted for R septic knee (re-admission), joint aspiration by ortho 11/23 - Surgical I&D R knee by orthopedics 11/26 - seizure overnight, uprovoked, no repeat episodes 11/28 plan for R knee prosthesis removal and placement of antibiotic beads by ortho  Assessment and Plan: Juan Nothdurft Jonesis a 65 y.o.malepresenting with right knee pain presenting with septic joint, readmit, has been off of antibiotics 7 days. PMH is significant for hypothyroidism, alcohol abuse, Hepatitis B, Hepatitis C, HTN, GERD, chronic pain syndrome, hx of TKA (both in 2015), hx of R septic knee (06/2015), major depression, anxiety, BPH.   # Right knee septic joint. s/p arthroscopy with irrigation and debridement x1, was last admitted 11/5 - 11/13, discharged with plans for home ceftriaxone IM presented s/p 7d without antibiotics. Leukocytosis resolved. Joint cultures from aspirate  growing few staph aureus. - s/p 5 days Ancef 2g q8h (11/21 >11/25, transitioned on day #6) - continue daptomycin day 4 (11/26 - )  - Synovial cultures pan-sensitive staph aureus - Plan per ID and ortho will be placement of antibiotic spacer followed by parenteral therapy. Options per ID include oritavancin which gives 10-14d antistaphylococcal coverage followed by linezolid (oral)   - appreciate ID recs, ortho recs - Bcx no growth, final - pain poorly controlled overnight, will adjust to dilaudid 2mg  q3hr PRN and vicoden q4hr PRN.  - PT/OT  # Unprovoked Seizure, one occurrence -  Patient had witnessed tonic-clonic seizure-like activity 11/26, no hx of seizures, appears to be unprovoked in the setting of cephalosporin  use which may lower seizure threshold. - EEG - normal - Ativan PRN repeat seizure - appreciate neurology recs - transitioned to daptomycin per ID recs  # Electrolyte abnormalities  - Hyperkalemia to 5.2 today - monitor with AM BMP  # AKI, improving Cr 2.36 >>1.35 (baseline 1.16). Likely prerenal, improving. - will recheck BMP tomorrow AM - avoid nephrotoxic agents  # RLQ pain, thought to be pain radiating from R knee or from back pain. No signs of UTI or abdominal process. Abdominal exam benign. +2 episodes diarrhea over past day, describes black stool. - will monitor - FOBT pending - Hgb has been stable 12.1, pending cbc today - can consider pelvic/scrotol [place]  # history of substance abuse/UDS positive for cocaine/history of EtOH use. - patient denies alcohol or drug use at this time. History of alcohol use, he states 2 years ago, however was treated 1 year ago per chart review. +cocaine on his three most recent urine drug screens, though patient denies use. - repeat UDS with only benzos and opiods - monitor on CIWA, 0, 3, 2 overnight  Scored highly disorientation, also anxiety and paroxysmal sweats  # Hypertension, improved. BP improved overnight, 130s-145/70s. Home propanolol recently dc'd due to cocaine use.   - continue 10 mg amlodipine daily - monitor BP - hydralazine prn for elevated pressures  #Hepatitis B, chronic and active.At home on Viread 300 mg daily.  - Continue home Viread 300 mg dosed by pharmacy every other day due to Cr Cl - Monitor  #Hepatitis C, chronic and active. At home on Sofosbuvir-Velpatasvir [name]).  - Continue home Epclusa 400-100 mg po tab daily  - Monitor  #Hypothyroidism.  Stable. At home on Levothyroxine. Previously TSH 0.20 on last admission with 200 mcg synthroid daily however dose not changed given acute illness.  Repeat this admission TSH 6.303, fT4 WNL at 1.07.   - Continue home Synthroid 200 mcg daily - will need recheck TSH  in 6 weeks   #difficulty sleeping. At home on Ambien 10 mg at bedtime.  - Ambien 5 mg PRN  #Depression . Stable. At home on Lexapro and Remeron. Also on Ambien 10 mg PRN  -Continue Lexapro 20 mg daily at bedtime   #BPH:On Silodosin 8mg  daily at home - flomax 0.4 mg Qd while hospitalized  #Chronic lumbar spine pain - stable. No neurologic deficits. - monitor  # ?Asthma - not indicated on problem list however albuterol on med list. - PRN albuterol  - Will follow up whether pt uses this  FEN/GI: KVO, Regular diet but NPO for procedure today  Disposition: Awaiting further work-up. Plan to discharge to SNF with PICC if patient consents  Subjective:  Patient feels pain was not well-controlled overnight, 8/10 knee pain this morning, feels he cannot bear any weight on the knee. Eating breakfast. No BM since surgery, does endorse passing gas. No other complaints this AM  Objective: Temp:  [97.4 F (36.3 C)-99 F (37.2 C)] 98.1 F (36.7 C) (11/29 0928) Pulse Rate:  [52-95] 69 (11/29 0928) Resp:  [11-24] 18 (11/29 0928) BP: (119-151)/(64-83) 149/78 (11/29 0928) SpO2:  [96 %-100 %] 99 % (11/29 0928) Physical Exam: General: NAD, lies comfortably in bed eating breakfast Cardiovascular: RRR, no m/r/g Respiratory: CTA bil, no W/R/R Abdomen: soft, nontender this AM, nd, normoactive BS Extremities:+right with leg brace in place Neuro: CN II-XII  intact  Laboratory:  Recent Labs Lab 01/19/16 0358 01/20/16 0423 01/21/16 0442  WBC 9.5 10.1 12.1*  HGB 12.5* 13.2 13.8  HCT 37.1* 38.8* 40.5  PLT 345 359 365    Recent Labs Lab 01/19/16 0137  01/20/16 0423 01/21/16 0442 01/22/16 0610  NA 136  < > 139 138 137  K 3.8  < > 4.2 3.9 5.2*  CL 105  < > 108 108 104  CO2 24  < > 25 24 26   BUN 9  < > 8 9 10   CREATININE 1.57*  < > 1.47* 1.30* 1.35*  CALCIUM 8.5*  < > 8.6* 8.7* 9.1  PROT 7.5  --   --   --   --   BILITOT 0.3  --   --   --   --   ALKPHOS 123  --   --   --   --    ALT <5*  --   --   --   --   AST 27  --   --   --   --   GLUCOSE 105*  < > 108* 90 128*  < > = values in this interval not displayed.  Imaging/Diagnostic Tests:  Dg Knee 2 Views Right: 01/14/2016 IMPRESSION: RIGHT knee prosthesis without acute complication. Soft tissue swelling RIGHT knee. Electronically Signed   By: Lavonia Dana M.D.   On: 01/14/2016 12:39   EEG 01/20/2016 Impression: This is an essentially normal awake and drowsy EEG. The presence of reduced voltages and intermixed beta activity is most commonly seen with anxiety and the administration of benzodiazepines. Clinical correlation is required. There is no evidence of an epileptic focus that would suggest high likelihood of persistent or recurrent seizure.   [name], MD 01/22/2016, 9:30 AM PGY-1, [place]  [place] Intern pager: [phone], text pages welcome

## 2016-01-22 NOTE — Op Note (Signed)
NAME:  Juan Wiggins                      ACCOUNT NO.:  MEDICAL RECORD NO.:  LOCATION:                                 FACILITY:  PHYSICIAN:  Rexanne Inocencio A. Gladstone Lighter, M.D.     DATE OF BIRTH:  DATE OF PROCEDURE:  01/21/2016 DATE OF DISCHARGE:                              OPERATIVE REPORT   SURGEON:  Kipp Brood. Ruffin Lada, M.D.  ASSISTANT:  Ardeen Jourdain, [place]  PREOPERATIVE DIAGNOSIS:  Infected right total knee arthroplasty secondary to an abscess that developed after he was bitten by a spider in the past.  POSTOPERATIVE DIAGNOSIS:  Infected right total knee arthroplasty secondary to an abscess that developed after he was bitten by a spider in the past.  OPERATION: 1. Removal of total knee arthroplasty on the right.  Note, this was     very complex case. 2. Extensive synovectomy. 3. Removal of all cement from the right total knee. 4. Insertion of an antibiotic spacer for the femoral side and the     tibial side. 5. Insertion of antibiotic beads.  Note, Gentamicin and vancomycin     were used in the cement.  DESCRIPTION OF PROCEDURE:  Under general anesthesia, routine orthopedic prep and draping of the right lower extremity was carried out.  I first did initial prep followed by the permanent prep.  The leg was exsanguinated with an Esmarch, tourniquet was elevated to 325 mmHg. Appropriate time-out was carried out.  I also marked the appropriate right leg in the holding area.  An incision was made through the old incision site.  Bleeders were identified and cauterized.  The incision then was carried down to the quadriceps muscle.  I then carried out a median parapatellar incision and reflected the patella laterally.  I then did an extensive synovectomy.  We did a thorough irrigation.  We did multiple thorough irrigations with saline.  Following that, we elected to leave the patella in place for fear of any further bone damage.  Patella was tight, there was no signs of any loosening,  no signs of any infection around the patella, but we did debride the soft tissue around the patella.  At this particular time, we then by careful dissection 1st utilizing a small oscillating saw, to break up the cement interface for the femoral component and tibial component.  We then utilized osteotomes as well.  I then flexed the knee and then utilized the oscillating saw to remove the center post for the polyethylene insert.  We then removed the insert 1stm then removed the tibial component and then the femoral component.  Note, we had a nice clean release of those components.  We had good bone preservation.  Great care was taken very meticulous care in fact with the curettes to curette out all the fixed pieces of cement.  We then went down into the tibial plateau and cleaned that out as well as thoroughly as we could.  We had to utilize a small oscillating saw, as well as osteotomes for removal of cement.  As I stated we had excellent bone preservation.  After this was done, we then went back and carried out  more of a synovectomy.  We kept thoroughly irrigating out the knee, and finally then used antibiotic solution to irrigate.  After we were satisfied that we were totally cleaned out the knee, we then made 2 separate cement spaces with gentamicin and vancomycin in the cement.  One component for the femoral component and 1 component was made for the tibial component.  We then inserted those spacer blocks as I said 1 in the femur and 1 in the tibia and then we inserted the antibiotic beads all around the gutters.  The wound then was closed over Hemovac drain in usual fashion.  Staples were used for the skin.  The patient will be maintained in a knee immobilizer.  Note, cultures were taken at the beginning of procedure and sent for aerobic and anaerobic on 01/21/2016.          ______________________________ Kipp Brood. Gladstone Lighter, M.D.     RAG/MEDQ  D:  01/21/2016  T:  01/22/2016   Job:  [id number]

## 2016-01-22 NOTE — Progress Notes (Addendum)
Subjective: 1 Day Post-Op Procedure(s) (LRB): REMOVAL OF KNEE PROSTESIS WITH RIGHT KNEE PLACEMENT OF ANTIBIOTIC SPACER (Right) Patient reports pain as moderate.   Patient seen in rounds with Dr. Gladstone Lighter. Patient is well, and has had no acute complaints or problems other than discomfort in the right knee. Voiding well. No SOB or chest pain. Reports desire to go home upon DC. No issues overnight.    Objective: Vital signs in last 24 hours: Temp:  [97.4 F (36.3 C)-99 F (37.2 C)] 98.1 F (36.7 C) (11/29 0928) Pulse Rate:  [52-77] 69 (11/29 0928) Resp:  [11-24] 18 (11/29 0928) BP: (119-149)/(64-79) 139/72 (11/29 1021) SpO2:  [96 %-100 %] 99 % (11/29 0928)  Intake/Output from previous day:  Intake/Output Summary (Last 24 hours) at 01/22/16 1121 Last data filed at 01/22/16 0929  Gross per 24 hour  Intake          2526.67 ml  Output             1450 ml  Net          1076.67 ml    Intake/Output this shift: Total I/O In: 240 [P.O.:240] Out: 200 [Urine:200]  Labs:  Recent Labs  01/20/16 0423 01/21/16 0442  HGB 13.2 13.8    Recent Labs  01/20/16 0423 01/21/16 0442  WBC 10.1 12.1*  RBC 4.08* 4.23  HCT 38.8* 40.5  PLT 359 365    Recent Labs  01/21/16 0442 01/22/16 0610  NA 138 137  K 3.9 5.2*  CL 108 104  CO2 24 26  BUN 9 10  CREATININE 1.30* 1.35*  GLUCOSE 90 128*  CALCIUM 8.7* 9.1    EXAM General - Patient is Alert and Oriented Extremity - Neurologically intact Sensation intact distally Intact pulses distally No cellulitis present Compartment soft Dressing - dressing C/D/I Motor Function - intact, moving foot and toes well on exam.  Hemovac pulled without difficulty.  Past Medical History:  Diagnosis Date  . Anxiety   . Arthritis    RHEUMATOID ARTHRITIS; OA LEFT KNEE  . Cancer (HCC)    MELANOMA REMOVED RT SHOULDER  . Depression   . GERD (gastroesophageal reflux disease)    PREVACID IF NEEDED  . Heart murmur   . Hemorrhoids   .  Hepatitis A    PT STATES TYPE OF HEPATITIS YOU GET FROM SHELLFISH  . Hepatitis B   . Hepatitis C   . Hypertension   . Hypothyroidism   . 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 Pancreatitis, alcoholic, acute   . Septic arthritis ([place]) 01/14/2016   RT KNEE  . Sleep apnea    CLAUSTROPHOBIC - COULD NOT TOLERATE CPAP MASK  . Tachycardia    PT STATES HIS HEART RATE USUALLY 100 OR MORE  . Thyroid disease     Assessment/Plan: 1 Day Post-Op Procedure(s) (LRB): REMOVAL OF KNEE PROSTESIS WITH RIGHT KNEE PLACEMENT OF ANTIBIOTIC SPACER (Right) Active Problems:   Infection of total knee replacement (HCC)   Septic arthritis (HCC)   Staphylococcal arthritis of right knee (HCC)   IVDU (intravenous drug user)   Seizure ([place])  Estimated body mass index is 25.31 kg/m as calculated from the following:   Height as of this encounter: 5' 11.5" (1.816 m).   Weight as of this encounter: 83.5 kg (184 lb). Advance diet Up with therapy D/C IV fluids when tolerating POs well  DVT Prophylaxis - Xarelto Touchdown weightbearing right LE  with assistance of walker  Will get up with therapy today with walker. Plan for DC to home this weekend pending progress. ID will continue to follow cultures for appropriate antibiotic coverage. Maintain knee immobilizer at all times.   Ardeen Jourdain, PA-C Orthopaedic Surgery 01/22/2016, 11:21 AM

## 2016-01-22 NOTE — Progress Notes (Signed)
Pharmacy Antibiotic Note  Juan Wiggins is a 65 y.o. male admitted on 01/14/2016 with septic arthritis of the right knee. Patient was recently discharge on 11/13 and when 7 days without antibiotics due to not being able to afford the medication. Joint growing MSSA. Knee prosthesis removed and antibiotic spacer placed 11/28.  Patient experienced witnessed tonic-clonic seizure thought to be due to cefazolin in setting of AKI and pharmacy consulted to start daptomycin. Dr. Tommy Medal suggests changing to daptomycin while inpatient and transition to oritavancin/dalbavancin outpt. Will dose ~8mg /kg due to systemic infection.  Afebrile, wbc wnl. Renal function improving (CrCl ~60 mL/min). Baseline CK 56. Will follow weekly.  Plan: Continue Daptomycin 700mg  q24h Monitor weekly CK Monitor s/sx of infection and plan for length of therapy Monitor renal function  Height: 5' 11.5" (181.6 cm) Weight: 184 lb (83.5 kg) IBW/kg (Calculated) : 76.45  Temp (24hrs), Avg:98.4 F (36.9 C), Min:97.4 F (36.3 C), Max:99 F (37.2 C)   Recent Labs Lab 01/18/16 0437 01/19/16 0137 01/19/16 0358 01/20/16 0423 01/21/16 0442 01/22/16 0610  WBC 8.9 9.2 9.5 10.1 12.1*  --   CREATININE  --  1.57* 1.52* 1.47* 1.30* 1.35*    Estimated Creatinine Clearance: 60.6 mL/min (by C-G formula based on SCr of 1.35 mg/dL (H)).    Allergies  Allergen Reactions  . Cefazolin Other (See Comments)    Patient had a seizure while on cefazolin and no other core cause could be identified other than the beta lactam  . Heparin Other (See Comments)    Low platelets (130s), no HIT testing performed, platelets recovered.  . Wellbutrin [Bupropion] Other (See Comments)    Hallucinations-started on 150mg  daily dosage    Antimicrobials this admission: 11/21 ancef >>>11/26 11/26 dapto >>>  Microbiology results: 11/5 MRSA PCR: Negative 11/5 right knee synovial fluid cx: Few MSSA 11/21 BCx: Negative  11/21 right knee synovial fluid:  Few MSSA  11/28 R-knee synovial fluids: ngtd  Thank you for allowing pharmacy to be a part of this patient's care.  Sloan Leiter, PharmD, BCPS Clinical Pharmacist [phone] until 3:30 PM today [phone] after hours 01/22/2016 10:39 AM

## 2016-01-22 NOTE — Progress Notes (Signed)
Physical Therapy Treatment Patient Details Name: Juan Wiggins MRN: [id number] DOB: [birth year] Today's Date: 01/22/2016    History of Present Illness 65 y.o. male with right knee pain presenting with septic joint, readmit, has been off of antibiotics 8 days. PMH is significant for hypothyroidism, alcohol abuse, Hepatitis B, Hepatitis C,  HTN, GERD, chronic pain syndrome, hx of TKA (both in 2015), hx of R septic knee (06/2015), major depression, anxiety, BPH.    PT Comments    Pt s/p removal of infected hardware and placement of abx spacer in R knee.  Pt continues to demonstrate safe mobility for d/c home with HHPT.  Improved balance with use of RW during gait and transfers.  Pt maintains weight bearing restrictions without cues.  Would benefit from continued acute PT for instruction in stair negotiation and HEP if pt continues to decline HHPT.     Follow Up Recommendations  Home health PT     Equipment Recommendations  None recommended by PT    Recommendations for Other Services       Precautions / Restrictions Precautions Precautions: None Restrictions Weight Bearing Restrictions: Yes RLE Weight Bearing: Touchdown weight bearing    Mobility  Bed Mobility Overal bed mobility: Independent                Transfers Overall transfer level: Modified independent Equipment used: Rolling walker (2 wheeled) Transfers: Sit to/from Stand Sit to Stand: Modified independent (Device/Increase time)         General transfer comment: Pt demos good recall of previous cues for hand placement and squaring up to surface before sitting.  No unsteadiness noted with transfer  Ambulation/Gait Ambulation/Gait assistance: Supervision Ambulation Distance (Feet): 60 Feet Assistive device: Rolling walker (2 wheeled) Gait Pattern/deviations: Step-to pattern;Antalgic;Decreased stance time - right;Decreased step length - left;Decreased weight shift to right Gait velocity: slow   General  Gait Details: antalgic gait, good use of RW to reduce weight bearing.    Stairs            Wheelchair Mobility    Modified Rankin (Stroke Patients Only)       Balance                                    Cognition Arousal/Alertness: Awake/alert Behavior During Therapy: WFL for tasks assessed/performed Overall Cognitive Status: Within Functional Limits for tasks assessed                      Exercises      General Comments        Pertinent Vitals/Pain Pain Assessment: 0-10 Pain Score: 8  Pain Location: R knee Pain Descriptors / Indicators: Throbbing Pain Intervention(s): Monitored during session;Limited activity within patient's tolerance;Repositioned    Home Living Family/patient expects to be discharged to:: Private residence Living Arrangements: Parent Available Help at Discharge: Family;Available 24 hours/day Type of Home: House Home Access: Stairs to enter Entrance Stairs-Rails: Can reach both Home Layout: Two level Home Equipment: Walker - 2 wheels;Cane - single point;Bedside commode;Shower seat;Hand held shower head;Wheelchair - manual;Crutches Additional Comments: Lives with elderly father who has a live in caregiver, caregiver can A with if needed    Prior Function Level of Independence: Independent with assistive device(s)          PT Goals (current goals can now be found in the care plan section) Acute Rehab PT Goals Patient Stated Goal: To  go home PT Goal Formulation: With patient Time For Goal Achievement: 01/29/16 Potential to Achieve Goals: Good Progress towards PT goals: Progressing toward goals;Goals downgraded-see care plan (downgraded to mod I as pt will need AD 2/2 WB restrictions)    Frequency    Min 3X/week      PT Plan Current plan remains appropriate    Co-evaluation             End of Session Equipment Utilized During Treatment: Gait belt Activity Tolerance: Patient tolerated treatment  well Patient left: in bed;with call bell/phone within reach     Time: 1430-1456 PT Time Calculation (min) (ACUTE ONLY): 26 min  Charges:  $Gait Training: 8-22 mins $Therapeutic Activity: 8-22 mins                    G Codes:      Nirvana Blanchett E Penven-Crew 01/22/2016, 3:06 PM

## 2016-01-22 NOTE — Progress Notes (Addendum)
    [facility] for Infectious Disease    Date of Admission:  01/14/2016   Total days of antibiotics 9        Day dapto 4        Day QD tenofovir        Daily sofosbuvir-velpatasvir           ID: Juan Wiggins is a 65 y.o. male with   Active Problems:   Infection of total knee replacement (HCC)   Septic arthritis ([place])   Staphylococcal arthritis of right knee ([place])   IVDU (intravenous drug user)   Seizure ([place])    Subjective: Afebrile, having some associated knee pain from recent surgery  Medications:  . amLODipine  10 mg Oral Daily  . aspirin EC  325 mg Oral Q breakfast  . DAPTOmycin (CUBICIN)  IV  700 mg Intravenous Q24H  . escitalopram  20 mg Oral Daily  . ferrous sulfate  325 mg Oral TID PC  . ketotifen  1 drop Both Eyes BID  . levothyroxine  200 mcg Oral QAC breakfast  . tamsulosin  0.4 mg Oral QPC breakfast  . tenofovir  300 mg Oral Daily    Objective: Vital signs in last 24 hours: Temp:  [97.4 F (36.3 C)-98.8 F (37.1 C)] 98.5 F (36.9 C) (11/29 1300) Pulse Rate:  [52-77] 68 (11/29 1300) Resp:  [11-24] 18 (11/29 1300) BP: (119-149)/(66-79) 138/68 (11/29 1300) SpO2:  [96 %-100 %] 100 % (11/29 1300)    Lab Results  Recent Labs  01/21/16 0442 01/22/16 0610  WBC 12.1* 11.8*  HGB 13.8 10.7*  HCT 40.5 32.4*  NA 138 137  K 3.9 5.2*  CL 108 104  CO2 24 26  BUN 9 10  CREATININE 1.30* 1.35*   Liver Panel No results for input(s): PROT, ALBUMIN, AST, ALT, ALKPHOS, BILITOT, BILIDIR, IBILI in the last 72 hours. Sedimentation Rate No results for input(s): ESRSEDRATE in the last 72 hours. C-Reactive Protein No results for input(s): CRP in the last 72 hours.  Microbiology: 11/21 aerobic cx  - staph aureus 11/22 synovial fluid cx - staph aureus Studies/Results: No results found.   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 He is now amenable to SNF vs. Home. Re-address in the morning  - will avoid FQ due to risk of lower seizure threshold per neurology request, which limits [place] further   - linezolid is a potential option which is oral. We have sent in RX for 30day supply  Unprovoked seizure = no seizure pattern on EEG thus no need for AED at this time  Chronic hepatitis C without hepatic coma = nearly finished with his treatment course. For now, we will avoid placing on rifampin due to drug interaction with curing hep c infection. Once he finishes his hep C treatment, roughly on December 1st,   Chronic hepatitis B = renal function has improved and we will switch to daily dosing. His hep b DNA viral load much improved in October. He initially had hep B flare when he started his hep C treatment. Will check hep B viral load. We were able to get him a 30 day supply of tenofovir before his insurance lapse.  Baxter Flattery [facility] for Infectious Diseases Cell: [phone] Pager: [phone]  01/22/2016, 3:24 PM

## 2016-01-22 NOTE — Evaluation (Addendum)
Occupational Therapy Evaluation Patient Details Name: Juan Wiggins MRN: [id number] DOB: 1951/01/27 Today's Date: 01/22/2016    History of Present Illness 65 y.o. male with right knee pain presenting with septic joint, readmit, has been off of antibiotics 8 days. PMH is significant for hypothyroidism, alcohol abuse, Hepatitis B, Hepatitis C,  HTN, GERD, chronic pain syndrome, hx of TKA (both in 2015), hx of R septic knee (06/2015), major depression, anxiety, BPH.   Clinical Impression   PTA, pt was independent with ADL and IADL. Pt now s/p removal of infected hardware and placement of antibiotic spacer in R knee 01/21/16. Pt currently requires max assist with LB ADL and min assist with functional mobility. Participation limited due to pain this session. Pt would benefit from continued OT services to improve independence with ADL and functional mobility and ensure safe D/C home. OT will continue to follow acutely with focus on tub transfers. Recommend home health OT services post-acute D/C; however, pt may decline this. Pt has all DME needs met.    Follow Up Recommendations  Home health OT;Supervision/Assistance - 24 hour    Equipment Recommendations  None recommended by OT       Precautions / Restrictions Precautions Precautions: None Required Braces or Orthoses: Knee Immobilizer - Right Knee Immobilizer - Right: On at all times Restrictions Weight Bearing Restrictions: Yes RLE Weight Bearing: Touchdown weight bearing      Mobility Bed Mobility Overal bed mobility: Independent             General bed mobility comments: Use of bed rail and increased time.  Transfers Overall transfer level: Needs assistance Equipment used: Rolling walker (2 wheeled) Transfers: Sit to/from Stand Sit to Stand: Min assist         General transfer comment: Min assist as pt with increased pain at this time.    Balance Overall balance assessment: Needs assistance Sitting-balance support:  No upper extremity supported;Feet supported Sitting balance-Leahy Scale: Normal     Standing balance support: During functional activity;Bilateral upper extremity supported Standing balance-Leahy Scale: Poor Standing balance comment: Reliant on B UE support from RW.                            ADL Overall ADL's : Needs assistance/impaired     Grooming: Set up;Sitting   Upper Body Bathing: Set up;Sitting   Lower Body Bathing: Maximal assistance;Sit to/from stand   Upper Body Dressing : Set up;Sitting   Lower Body Dressing: Maximal assistance;Sit to/from stand   Toilet Transfer: Minimal assistance;Ambulation;RW   Toileting- Clothing Manipulation and Hygiene: Minimal assistance;Sit to/from stand       Functional mobility during ADLs: Minimal assistance;Rolling walker General ADL Comments: Only able to complete short distance functional mobility with min assist due to pain. Educated pt on TWB precautions and ADL post-operatively.     Vision Vision Assessment?: No apparent visual deficits   Perception     Praxis      Pertinent Vitals/Pain Pain Assessment: 0-10 Pain Score: 8  Pain Location: R knee Pain Descriptors / Indicators: Throbbing Pain Intervention(s): Monitored during session;Limited activity within patient's tolerance;Repositioned     Hand Dominance Right   Extremity/Trunk Assessment Upper Extremity Assessment Upper Extremity Assessment: Overall WFL for tasks assessed   Lower Extremity Assessment Lower Extremity Assessment: RLE deficits/detail RLE Deficits / Details: Decreased strength and ROM as expected post-operatively. RLE: Unable to fully assess due to immobilization;Unable to fully assess due to pain  Communication Communication Communication: No difficulties   Cognition Arousal/Alertness: Awake/alert Behavior During Therapy: WFL for tasks assessed/performed Overall Cognitive Status: Within Functional Limits for tasks  assessed                     General Comments       Exercises       Shoulder Instructions      Home Living Family/patient expects to be discharged to:: Private residence Living Arrangements: Parent Available Help at Discharge: Family;Available 24 hours/day Type of Home: House Home Access: Stairs to enter [employer] of Steps: 5 Entrance Stairs-Rails: Can reach both Home Layout: Two level Alternate Level Stairs-Number of Steps: flight Alternate Level Stairs-Rails: Right Bathroom Shower/Tub: Tub/shower unit Shower/tub characteristics: Curtain Biochemist, clinical: Standard Bathroom Accessibility: Yes   Home Equipment: Environmental consultant - 2 wheels;Cane - single point;Bedside commode;Shower seat;Hand held shower head;Wheelchair - manual;Crutches   Additional Comments: Lives with elderly father who has a live in caregiver, caregiver can A with if needed      Prior Functioning/Environment Level of Independence: Independent with assistive device(s)                 OT Problem List: Decreased strength;Decreased range of motion;Decreased activity tolerance;Impaired balance (sitting and/or standing);Decreased knowledge of use of DME or AE;Decreased safety awareness;Decreased knowledge of precautions;Pain   OT Treatment/Interventions: Self-care/ADL training;Therapeutic exercise;Energy conservation;Therapeutic activities;Patient/family education;Balance training    OT Goals(Current goals can be found in the care plan section) Acute Rehab OT Goals Patient Stated Goal: To go home OT Goal Formulation: With patient Time For Goal Achievement: 02/05/16 Potential to Achieve Goals: Good ADL Goals Pt Will Perform Lower Body Bathing: with supervision;sit to/from stand Pt Will Perform Lower Body Dressing: with supervision;sit to/from stand Pt Will Transfer to Toilet: with supervision;ambulating;regular height toilet Pt Will Perform Toileting - Clothing Manipulation and hygiene: with  supervision;sit to/from stand Pt Will Perform Tub/Shower Transfer: Tub transfer;3 in 1;ambulating;rolling walker  OT Frequency: Min 2X/week   Barriers to D/C:            Co-evaluation              End of Session Equipment Utilized During Treatment: Gait belt;Rolling walker;Right knee immobilizer  Activity Tolerance: Patient tolerated treatment well Patient left: in bed;with call bell/phone within reach   Time: 7445-1460 OT Time Calculation (min): 19 min Charges:  OT General Charges $OT Visit: 1 Procedure OT Evaluation $OT Eval Moderate Complexity: 1 Procedure   Norman Herrlich, OTR/L [phone] 01/22/2016, 3:22 PM

## 2016-01-23 ENCOUNTER — Encounter (HOSPITAL_COMMUNITY): Payer: Self-pay | Admitting: Orthopedic Surgery

## 2016-01-23 LAB — BASIC METABOLIC PANEL
Anion gap: 4 — ABNORMAL LOW (ref 5–15)
BUN: 9 mg/dL (ref 6–20)
CALCIUM: 8.8 mg/dL — AB (ref 8.9–10.3)
CO2: 26 mmol/L (ref 22–32)
CREATININE: 1.22 mg/dL (ref 0.61–1.24)
Chloride: 105 mmol/L (ref 101–111)
GFR calc non Af Amer: >60 mL/min (ref >60)
Glucose, Bld: 94 mg/dL (ref 65–99)
Potassium: 3.8 mmol/L (ref 3.5–5.1)
SODIUM: 135 mmol/L (ref 135–145)

## 2016-01-23 LAB — CBC
HCT: 31 % — ABNORMAL LOW (ref 39.0–52.0)
Hemoglobin: 10.4 g/dL — ABNORMAL LOW (ref 13.0–17.0)
MCH: 32 pg (ref 26.0–34.0)
MCHC: 33.5 g/dL (ref 30.0–36.0)
MCV: 95.4 fL (ref 78.0–100.0)
Platelets: 263 10*3/uL (ref 150–400)
RBC: 3.25 MIL/uL — ABNORMAL LOW (ref 4.22–5.81)
RDW: 14 % (ref 11.5–15.5)
WBC: 9.3 10*3/uL (ref 4.0–10.5)

## 2016-01-23 MED ORDER — POLYETHYLENE GLYCOL 3350 17 G PO PACK
17.0000 g | PACK | Freq: Every day | ORAL | Status: DC
  Administered 2016-01-23 – 2016-01-24 (×2): 17 g via ORAL
  Filled 2016-01-23 (×2): qty 1

## 2016-01-23 MED FILL — LINEZOLID 600 MG TABLET: 600 | 30 days supply | Qty: 60 | Fill #0

## 2016-01-23 NOTE — Discharge Instructions (Addendum)
Orthopedics Walk with your walker. Touchdown weightbearing right leg Change your dressing daily with gauze and paper tape. Do not apply ointments or creams.  Shower only, no tub bath. Wear knee immobilizer at all times except when bathing. No bending of the knee at any time. Call if any temperatures greater than 101 or any wound complications: [phone] during the day and ask for Dr. Charlestine Night nurse, Brunilda Payor. Follow up in one week with Dr. Gladstone Lighter  Family Medicine You were hospitalized with a right knee joint infection, which has been treated with IV antibiotics throughout the course of your hospitalization.  It is best for your infection if you continue to receive IV antibiotics for the next 6 weeks, and we understand that you have agreed to at least a short-course stay in a skilled nursing facility, which we believe is the best choice you can make for your overall health. While in skilled nursing you will be able to do physical therapy to regain function after your knee surgery, and you will also be able to receive the antibiotics you need.   During your hospitalization, you had a seizure, and there was not a clear cause for the seizure.  There was nothing abnormal on the MRI of your brain, and the EEG of your brain was also normal.  The neurology specialists do not believe you need to be on any long-term medication to prevent seizure, and we do not think you are at high risk of having another seizure in the future.  Bone and Joint Infections, Adult Introduction Bone infections (osteomyelitis) and joint infections (septic arthritis) occur when bacteria or other germs get inside a bone or a joint. This can happen if you have an infection in another part of your body that spreads through your blood. Germs from your skin or from outside of your body can also cause this type of infection if you have a wound or a broken bone (fracture) that breaks the skin. Anyone can get a bone infection or  joint infection. You may be more likely to get this type of infection if you have a condition, such as diabetes, that lowers your ability to fight infection or increases your chances of getting an infection. Bone and joint infections can cause damage, and they can spread to other areas of your body. They need to be treated quickly. What are the causes? Most bone and joint infections are caused by bacteria. They can also be caused by other germs, such as viruses and funguses. What increases the risk? This condition is more likely to develop in:  People who recently had surgery, especially bone or joint surgery.  People who have a long-term (chronic) disease, such as:  HIV (human immunodeficiency virus).  Diabetes.  Rheumatoid arthritis.  Sickle cell anemia.  Elderly people.  People who take medicines that block or weaken the bodys defense system (immune system).  People who have a condition that reduces their blood flow.  People who are on kidney dialysis.  People who have an artificial joint.  People who have had a joint or bone repaired with plates or screws (surgical hardware).  People who use or abuse IV drugs.  People who have had trauma, such as stepping on a nail. What are the signs or symptoms? Symptoms vary depending on the type and location of your infection. Common symptoms of bone and joint infections include:  Fever and chills.  Redness and warmth.  Swelling.  Pain and stiffness.  Drainage of fluid or  pus near the infection.  Weight loss and fatigue.  Decreased ability to use a hand or foot. How is this diagnosed? This condition may be diagnosed based on symptoms, medical history, a physical exam, and diagnostic tests. Tests can help to identify the cause of the infection. You may have various tests, such as:  A sample of tissue, fluid, or blood taken to be examined under a microscope.  A procedure to remove fluid from the infected joint with a  needle (joint aspiration) for testing in a lab.  Pus or discharge swabbed from a wound for testing to identify germs and to determine what type of medicine will kill them (culture and sensitivity).  Blood tests to look for evidence of infection and inflammation (biomarkers).  Imaging studies to determine how severe the bone or joint infection is. These may include:  X-rays.  CT scan.  MRI.  Bone scan. How is this treated? Treatment depends on the cause and type of infection. Antibiotic medicines are usually the first treatment for a bone or joint infection. Treatment with antibiotics may include:  Getting IV antibiotics. This may be done in a hospital at first. You may have to continue IV antibiotics at home for several weeks. You may also have to take antibiotics by mouth for several weeks after that.  Taking more than one kind of antibiotic. Treatment may start with a type of antibiotic that works against many different bacteria (broad spectrumantibiotics). IV antibiotics may be changed if tests show that another type may work better. Other treatments may include:  Draining fluid from the joint by placing a needle into it (aspiration).  Surgery to remove:  Dead or dying tissue from a bone or joint.  An infected artificial joint.  Infected plates or screws that were used to repair a broken bone. Follow these instructions at home:  Take medicines only as directed by your health care provider.  Take your antibiotic medicine as directed by your health care provider. Finish the antibiotic even if you start to feel better.  Follow instructions from your health care provider about how to take IV antibiotics at home.  Ask your health care provider if you have any restrictions on your activities.  Keep all follow-up visits as directed by your health care provider. This is important. Contact a health care provider if:  You have a fever or chills.  You have redness, warmth,  pain, or swelling that returns after treatment. Get help right away if:  You have rapid breathing or you have trouble breathing.  You have chest pain.  You cannot drink fluids or make urine.  The affected arm or leg swells, changes color, or turns blue. This information is not intended to replace advice given to you by your health care provider. Make sure you discuss any questions you have with your health care provider. Document Released: 02/09/2005 Document Revised: 07/18/2015 Document Reviewed: 02/07/2014  2017 Elsevier

## 2016-01-23 NOTE — Progress Notes (Signed)
Subjective: 2 Days Post-Op Procedure(s) (LRB): REMOVAL OF KNEE PROSTESIS WITH RIGHT KNEE PLACEMENT OF ANTIBIOTIC SPACER (Right) Patient reports pain as 5 on 0-10 scale.Will plan on DC on Saturday..Will have Infectious Disease DC him on the appropriate antibiotics.WBC is 9.3 and he is afebrile.    Objective: Vital signs in last 24 hours: Temp:  [97.9 F (36.6 C)-98.6 F (37 C)] 98.2 F (36.8 C) (11/30 0520) Pulse Rate:  [68-80] 80 (11/30 0520) Resp:  [18] 18 (11/30 0520) BP: (127-149)/(64-78) 143/69 (11/30 0520) SpO2:  [99 %-100 %] 99 % (11/30 0520)  Intake/Output from previous day: 11/29 0701 - 11/30 0700 In: 942 [P.O.:942] Out: 200 [Urine:200] Intake/Output this shift: No intake/output data recorded.   Recent Labs  01/21/16 0442 01/22/16 0610 01/23/16 0344  HGB 13.8 10.7* 10.4*    Recent Labs  01/22/16 0610 01/23/16 0344  WBC 11.8* 9.3  RBC 3.37* 3.25*  HCT 32.4* 31.0*  PLT 306 263    Recent Labs  01/22/16 0610 01/23/16 0344  NA 137 135  K 5.2* 3.8  CL 104 105  CO2 26 26  BUN 10 9  CREATININE 1.35* 1.22  GLUCOSE 128* 94  CALCIUM 9.1 8.8*   No results for input(s): LABPT, INR in the last 72 hours.  Dorsiflexion/Plantar flexion intact  Assessment/Plan: 2 Days Post-Op Procedure(s) (LRB): REMOVAL OF KNEE PROSTESIS WITH RIGHT KNEE PLACEMENT OF ANTIBIOTIC SPACER (Right) Up with therapy  Mclean Moya A 01/23/2016, 7:38 AM

## 2016-01-23 NOTE — Progress Notes (Signed)
Physical Therapy Treatment Patient Details Name: Juan Wiggins MRN: [id number] DOB: Nov 28, 1950 Today's Date: 01/23/2016    History of Present Illness 65 y.o. male with right knee pain presenting with septic joint, readmit, has been off of antibiotics 8 days. PMH is significant for hypothyroidism, alcohol abuse, Hepatitis B, Hepatitis C,  HTN, GERD, chronic pain syndrome, hx of TKA (both in 2015), hx of R septic knee (06/2015), major depression, anxiety, BPH.    PT Comments    Pt attempted navigating stairs with RW today, but stated "I am not ready for this" after attempting 1 step. Pt was able to ascend single step with min guard, but noted that he did not trust the RW with ascent to the 2nd step. Pt was able to descend with min guard and had good understanding of TDWB status throughout. Pt required a standing rest break while ambulating today 2/2 fatigue. Pt reports that he is hoping to go to a SNF after discharge, and has already made arrangements to do so. Pt is a good candidate for SNF to further educate pt on stair navigation as well as maximize strength gains before d/c to home.   Follow Up Recommendations  SNF     Equipment Recommendations  None recommended by PT    Recommendations for Other Services       Precautions / Restrictions Precautions Precautions: None Required Braces or Orthoses: Knee Immobilizer - Right Knee Immobilizer - Right: On at all times Restrictions Weight Bearing Restrictions: Yes RLE Weight Bearing: Touchdown weight bearing    Mobility  Bed Mobility Overal bed mobility: Independent             General bed mobility comments: Use of bed rail and increased time.  Transfers Overall transfer level: Modified independent Equipment used: Rolling walker (2 wheeled) Transfers: Sit to/from Stand Sit to Stand: Modified independent (Device/Increase time)         General transfer comment: pt demonstrates good recall of previous cues for hand  placement. No physical assist required  Ambulation/Gait Ambulation/Gait assistance: Min guard Ambulation Distance (Feet): 50 Feet Assistive device: Rolling walker (2 wheeled) Gait Pattern/deviations: Step-to pattern;Decreased stride length;Antalgic Gait velocity: decreased Gait velocity interpretation: Below normal speed for age/gender General Gait Details: Good understanding of touch down weight bearing status.    Stairs Stairs: Yes Stairs assistance: Min guard Stair Management: One rail Left;With walker;Backwards Number of Stairs: 1 General stair comments: Pt attempted but stated "I am not ready for this;" pt was able to clear the first step but demonstrated/expressed difficulty clearing the second step  Wheelchair Mobility    Modified Rankin (Stroke Patients Only)       Balance Overall balance assessment: Needs assistance Sitting-balance support: No upper extremity supported;Feet supported Sitting balance-Leahy Scale: Normal Sitting balance - Comments: Pt able to sit EOB without UE assist   Standing balance support: During functional activity;Single extremity supported Standing balance-Leahy Scale: Poor Standing balance comment: Pt able to stand statically with single UE support, but requires B UE support for dynamic activities                    Cognition Arousal/Alertness: Awake/alert Behavior During Therapy: WFL for tasks assessed/performed Overall Cognitive Status: Within Functional Limits for tasks assessed                      Exercises      General Comments        Pertinent Vitals/Pain Pain Assessment: Faces Faces  Pain Scale: Hurts little more Pain Location: R knee Pain Descriptors / Indicators: Aching Pain Intervention(s): Limited activity within patient's tolerance;Monitored during session;Repositioned    Home Living                      Prior Function            PT Goals (current goals can now be found in the care  plan section) Acute Rehab PT Goals Patient Stated Goal: To go home PT Goal Formulation: With patient Time For Goal Achievement: 01/29/16 Progress towards PT goals: Progressing toward goals    Frequency    Min 3X/week      PT Plan Discharge plan needs to be updated    Co-evaluation             End of Session Equipment Utilized During Treatment: Gait belt Activity Tolerance: Patient tolerated treatment well Patient left: with call bell/phone within reach;in chair     Time: [id number] PT Time Calculation (min) (ACUTE ONLY): 29 min  Charges:  $Gait Training: 8-22 mins $Therapeutic Activity: 8-22 mins                    G Codes:      Tonia Brooms Feb 22, 2016, 2:51 PM Tonia Brooms, SPT [phone]

## 2016-01-23 NOTE — Progress Notes (Signed)
[facility]    Date of Admission:  01/14/2016   Total days of antibiotics 10        Day dapto 5        Day QD tenofovir            ID: YOUCEF KLAS is a 65 y.o. male with   Active Problems:   Infection of total knee replacement (HCC)   Septic arthritis ([place])   Staphylococcal arthritis of right knee ([place])   IVDU (intravenous drug user)   Seizure ([place])    Subjective: Afebrile, having some associated knee pain from recent surgery. He is amenable to doing 2-3 wk of SNF to rehab his knee   Medications:  . amLODipine  10 mg Oral Daily  . aspirin EC  325 mg Oral Q breakfast  . DAPTOmycin (CUBICIN)  IV  700 mg Intravenous Q24H  . escitalopram  20 mg Oral Daily  . ferrous sulfate  325 mg Oral TID PC  . ketotifen  1 drop Both Eyes BID  . levothyroxine  200 mcg Oral QAC breakfast  . polyethylene glycol  17 g Oral Daily  . tamsulosin  0.4 mg Oral QPC breakfast  . tenofovir  300 mg Oral Daily    Objective: Vital signs in last 24 hours: Temp:  [97.9 F (36.6 C)-99 F (37.2 C)] 99 F (37.2 C) (11/30 1344) Pulse Rate:  [70-80] 72 (11/30 1344) Resp:  [18] 18 (11/30 0520) BP: (125-143)/(61-69) 125/61 (11/30 1344) SpO2:  [97 %-99 %] 97 % (11/30 1344) Physical Exam  Constitutional: He is oriented to person, place, and time. He appears well-developed and well-nourished. No distress.  HENT:  Mouth/Throat: Oropharynx is clear and moist. No oropharyngeal exudate.  Cardiovascular: Normal rate, regular rhythm and normal heart sounds. Exam reveals no gallop and no friction rub.  No murmur heard.  Pulmonary/Chest: Effort normal and breath sounds normal. No respiratory distress. He has no wheezes.  Abdominal: Soft. Bowel sounds are normal. He exhibits no distension. There is no tenderness.  Ext: right knee is wrapped from surgery Neurological: He is alert and oriented to person, place, and time.  Skin: Skin is warm and dry. No rash noted. No erythema.    Psychiatric: He has a normal mood and affect. His behavior is normal.      Lab Results  Recent Labs  01/22/16 0610 01/23/16 0344  WBC 11.8* 9.3  HGB 10.7* 10.4*  HCT 32.4* 31.0*  NA 137 135  K 5.2* 3.8  CL 104 105  CO2 26 26  BUN 10 9  CREATININE 1.35* 1.22   Lab Results  Component Value Date   ESRSEDRATE 55 (H) 01/14/2016   Lab Results  Component Value Date   CRP 9.4 (H) 01/14/2016    Microbiology: 11/21 aerobic cx  - staph aureus 11/22 synovial fluid cx - staph aureus 11/28 wound cx NGTD  Studies/Results: No results found.   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 He is now amenable to SNF for 2-3 wk  - plan to discharge on dapto 7074mIV daily ( 31m45mg/d) until duration of SNF stay. Ideally would like 6 wks - will need weekly cbc, bmp, and CK - will change him to oral linezolid 600m25m BID to start after his daptomycin is ginve - plan for a  total of 6 wk - end date in January 4th  Unprovoked seizure = no seizure pattern on EEG thus no need for AED at this time  Chronic hepatitis C without hepatic coma =will check hep C viral load today  Chronic hepatitis B = continue with tenofovir 340m daily  Rheumatoid arthritis = he states he is being evaluated for humira. Will discuss as an outpatient its implications with [facility]  Will sign off. And arrange for IV abtx  And follow up.  Listed below is the IV abtx order  Diagnosis: Prosthetic joint infection  Culture Result: MSSA  Allergies  Allergen Reactions  . Cefazolin Other (See Comments)    Patient had a seizure while on cefazolin and no other core cause could be identified other than the beta lactam  . Heparin Other (See Comments)    Low platelets (130s), no HIT testing performed, platelets recovered.  . Wellbutrin [Bupropion] Other  (See Comments)    Hallucinations-started on 1579mdaily dosage    Discharge antibiotics: Per pharmacy protocol  daptomycin  Duration: As long as he is in the SNF End Date: Ideally through Jan 4th but patient may not stay @ SNF for 6 wk, he is planning for 3 wk. Thus Dec 20th  [facility] Protocol:  Labs weekly while on IV antibiotics: __x CBC with differential __x CMP _x_ CRP _x_ ESR _x_ CK  _x_ Please pull PIC at completion of IV antibiotics  Fax weekly labs to [phone] Follow Up Appt: 2-3 wk  @  [facility] Cell: [phone]: [phone]  01/23/2016, 4:32 PM

## 2016-01-23 NOTE — Progress Notes (Signed)
Family Medicine Teaching Service Daily Progress Note Intern Pager: [phone]  Patient name: Juan Wiggins Medical record number: [id number] Date of birth: 07-May-1950 Age: 65 y.o. Gender: male  Primary Care Provider: Helane Rima, MD Consultants:  Ortho, ID Code Status: Full  Pt Overview and Major Events to Date:  11/22 - Admitted for R septic knee (re-admission), joint aspiration by ortho 11/23 - Surgical I&D R knee by orthopedics 11/26 - seizure overnight, uprovoked, no repeat episodes 11/28 - R knee prosthesis removal and placement of antibiotic beads by ortho  Assessment and Plan: Juan Cordle Jonesis a 65 y.o.malepresenting with right knee pain presenting with septic joint, readmit, has been off of antibiotics 7 days. PMH is significant for hypothyroidism, alcohol abuse, Hepatitis B, Hepatitis C, HTN, GERD, chronic pain syndrome, hx of TKA (both in 2015), hx of R septic knee (06/2015), major depression, anxiety, BPH.   # Right knee septic joint. s/p arthroscopy with irrigation and debridement x1, was last admitted 11/5 - 11/13, discharged with plans for home ceftriaxone IM presented s/p 7d without antibiotics.  Synovial cultures pan-sensitive staph aureus. Patient amenable to short-course SNF placement at discharge. Would like to know if he can do 3w of IV therapy in SNF and then transition to outpt therapy. - s/p 5 days Ancef 2g q8h (11/21 >11/25, transitioned on day #6) - continue daptomycin day 5 (11/26 - )  - Patient may be amenable to SNF now for continued antibiotic therapy (patient needs 6 weeks of antibiotics). Outpatient options per ID are limited but may include linezolid (oral)   - appreciate ID recs, ortho recs - Pain control with dilaudid 2mg  q3hr PRN and vicoden q4hr PRN.  - PT/OT  # Unprovoked Seizure, one occurrence -  Patient had witnessed tonic-clonic seizure-like activity 11/26, no hx of seizures, appears to be unprovoked in the setting of cephalosporin use which may  lower seizure threshold. - EEG - normal - Ativan PRN repeat seizure - appreciate neurology recs - transitioned to daptomycin per ID recs  # Electrolyte abnormalities  - Hyperkalemia to 5.2>>>3.8 over last 24 hours. - monitor with AM BMP  # AKI, improving Cr 2.36 >>1.22 (baseline 1.16). Likely prerenal, improving. - will recheck BMP tomorrow AM - avoid nephrotoxic agents  # RLQ pain, thought to be pain radiating from R knee or from back pain. No signs of UTI or abdominal process. Abdominal exam benign. +2 episodes diarrhea over past day, describes black stool. Had ordered FOBT which was apparently cancelled.  Diarrhea resolved now with no further black stools, will monitor and get FOBT if necessary. - will monitor  # history of substance abuse/UDS positive for cocaine/history of EtOH use. - patient denies alcohol or drug use at this time. History of alcohol use, he states 2 years ago, however was treated 1 year ago per chart review. +cocaine on his three most recent urine drug screens, though patient denies use. - repeat UDS with only benzos and opiods - monitor on CIWA, 0, 2 overnight  # Hypertension, improved. BP improved overnight, 130s-145/70s. Home propanolol recently dc'd due to cocaine use.   - continue 10 mg amlodipine daily - monitor BP - hydralazine prn for elevated pressures  #Hepatitis B, chronic and active.At home on Viread 300 mg daily.  - Continue home Viread 300 mg dosed by pharmacy every other day due to Cr Cl - Monitor  #Hepatitis C, chronic and active. At home on Sofosbuvir-Velpatasvir [name]).  - Continue home Epclusa 400-100 mg po tab daily  -  Monitor  #Hypothyroidism. Stable. At home on Levothyroxine. Previously TSH 0.20 on last admission with 200 mcg synthroid daily however dose not changed given acute illness.  Repeat this admission TSH 6.303, fT4 WNL at 1.07.   - Continue home Synthroid 200 mcg daily - will need recheck TSH in 6 weeks    #difficulty sleeping. At home on Ambien 10 mg at bedtime.  - Ambien 5 mg PRN  #Depression . Stable. At home on Lexapro and Remeron. Also on Ambien 10 mg PRN  -Continue Lexapro 20 mg daily at bedtime   #BPH:On Silodosin 8mg  daily at home - flomax 0.4 mg Qd while hospitalized  #Chronic lumbar spine pain - stable. No neurologic deficits. - monitor  # ?Asthma - not indicated on problem list however albuterol on med list. - PRN albuterol  - Will follow up whether pt uses this  FEN/GI: KVO, Regular diet but NPO for procedure today  Disposition: Awaiting further work-up. Plan to discharge to SNF with PICC if patient consents  Subjective:  Patient complains of pain this morning in his knee. Notes he is now amenable to SNF but would like to only go for 3 weeks if possible. No N/V/D/C. No acute evetns o/n.  Objective: Temp:  [97.9 F (36.6 C)-98.6 F (37 C)] 98.2 F (36.8 C) (11/30 0520) Pulse Rate:  [68-80] 80 (11/30 0520) Resp:  [18] 18 (11/30 0520) BP: (127-143)/(64-72) 143/69 (11/30 0520) SpO2:  [99 %-100 %] 99 % (11/30 0520) Physical Exam: General: NAD, lies comfortably in bed eating breakfast Cardiovascular: RRR, no m/r/g Respiratory: CTA bil, no W/R/R Abdomen: soft, nontender this AM, nd, normoactive BS Extremities:+right with leg brace in place Neuro: CN II-XII  intact  Laboratory:  Recent Labs Lab 01/21/16 0442 01/22/16 0610 01/23/16 0344  WBC 12.1* 11.8* 9.3  HGB 13.8 10.7* 10.4*  HCT 40.5 32.4* 31.0*  PLT 365 306 263    Recent Labs Lab 01/19/16 0137  01/21/16 0442 01/22/16 0610 01/23/16 0344  NA 136  < > 138 137 135  K 3.8  < > 3.9 5.2* 3.8  CL 105  < > 108 104 105  CO2 24  < > 24 26 26   BUN 9  < > 9 10 9   CREATININE 1.57*  < > 1.30* 1.35* 1.22  CALCIUM 8.5*  < > 8.7* 9.1 8.8*  PROT 7.5  --   --   --   --   BILITOT 0.3  --   --   --   --   ALKPHOS 123  --   --   --   --   ALT <5*  --   --   --   --   AST 27  --   --   --   --    GLUCOSE 105*  < > 90 128* 94  < > = values in this interval not displayed.  Imaging/Diagnostic Tests:  Dg Knee 2 Views Right: 01/14/2016 IMPRESSION: RIGHT knee prosthesis without acute complication. Soft tissue swelling RIGHT knee. Electronically Signed   By: Lavonia Dana M.D.   On: 01/14/2016 12:39   EEG 01/20/2016 Impression: This is an essentially normal awake and drowsy EEG. The presence of reduced voltages and intermixed beta activity is most commonly seen with anxiety and the administration of benzodiazepines. Clinical correlation is required. There is no evidence of an epileptic focus that would suggest high likelihood of persistent or recurrent seizure.   [name], MD 01/23/2016, 9:36 AM PGY-1, [place]  [place] Intern pager: [phone], text pages welcome

## 2016-01-24 LAB — BODY FLUID CULTURE: Culture: NO GROWTH

## 2016-01-24 LAB — CBC
HCT: 30.5 % — ABNORMAL LOW (ref 39.0–52.0)
Hemoglobin: 10.1 g/dL — ABNORMAL LOW (ref 13.0–17.0)
MCH: 32 pg (ref 26.0–34.0)
MCHC: 33.1 g/dL (ref 30.0–36.0)
MCV: 96.5 fL (ref 78.0–100.0)
PLATELETS: 290 10*3/uL (ref 150–400)
RBC: 3.16 MIL/uL — AB (ref 4.22–5.81)
RDW: 14.3 % (ref 11.5–15.5)
WBC: 9.9 10*3/uL (ref 4.0–10.5)

## 2016-01-24 LAB — BASIC METABOLIC PANEL
Anion gap: 6 (ref 5–15)
BUN: 10 mg/dL (ref 6–20)
CO2: 28 mmol/L (ref 22–32)
Calcium: 9.4 mg/dL (ref 8.9–10.3)
Chloride: 102 mmol/L (ref 101–111)
Creatinine, Ser: 1.33 mg/dL — ABNORMAL HIGH (ref 0.61–1.24)
GFR calc Af Amer: >60 mL/min (ref >60)
GFR, EST NON AFRICAN AMERICAN: 55 mL/min — AB (ref >60)
GLUCOSE: 105 mg/dL — AB (ref 65–99)
POTASSIUM: 4.6 mmol/L (ref 3.5–5.1)
Sodium: 136 mmol/L (ref 135–145)

## 2016-01-24 LAB — HCV RNA QUANT: HCV Quantitative: NOT DETECTED IU/mL (ref >50)

## 2016-01-24 MED ORDER — OXYCODONE-ACETAMINOPHEN 7.5-325 MG PO TABS: 1.0000 | ORAL_TABLET | ORAL | 0 refills | Status: DC | PRN

## 2016-01-24 MED ORDER — HYDROCODONE-ACETAMINOPHEN 5-325 MG PO TABS: 1.0000 | ORAL_TABLET | Freq: Four times a day (QID) | ORAL | 0 refills | Status: DC | PRN

## 2016-01-24 MED ORDER — MAGIC MOUTHWASH W/LIDOCAINE
5.0000 mL | Freq: Three times a day (TID) | ORAL | Status: DC | PRN
  Filled 2016-01-24: qty 5

## 2016-01-24 MED ORDER — AMLODIPINE BESYLATE 10 MG PO TABS: 10.0000 mg | ORAL_TABLET | Freq: Every day | ORAL | 0 refills | Status: DC

## 2016-01-24 MED ORDER — SENNA 8.6 MG PO TABS: 1.0000 | ORAL_TABLET | Freq: Every day | ORAL | Status: DC | PRN

## 2016-01-24 MED ORDER — OXYCODONE-ACETAMINOPHEN 7.5-325 MG PO TABS
1.0000 | ORAL_TABLET | ORAL | Status: DC | PRN
  Administered 2016-01-24: 1 via ORAL
  Filled 2016-01-24: qty 1

## 2016-01-24 MED ORDER — SODIUM CHLORIDE 0.9 % IV SOLN: 700.0000 mg | INTRAVENOUS | 0 refills | Status: DC

## 2016-01-24 MED ORDER — ZOLPIDEM TARTRATE 5 MG PO TABS: 5.0000 mg | ORAL_TABLET | Freq: Every evening | ORAL | 0 refills | Status: DC | PRN

## 2016-01-24 MED ORDER — SENNA 8.6 MG PO TABS: 1.0000 | ORAL_TABLET | Freq: Every day | ORAL | 0 refills | Status: DC | PRN

## 2016-01-24 MED ORDER — MORPHINE SULFATE ER 30 MG PO TBCR: 30.0000 mg | EXTENDED_RELEASE_TABLET | Freq: Two times a day (BID) | ORAL | 0 refills | Status: DC

## 2016-01-24 MED ORDER — SODIUM CHLORIDE 0.9% FLUSH: 10.0000 mL | INTRAVENOUS | Status: DC | PRN

## 2016-01-24 NOTE — Progress Notes (Signed)
Pt medicated with prn iv dilaudid at 1550hrs as ordered. Pt then voiced that he will be expecting his next dose of dilaudid at 1900hrs. Pt notified that discharge summary does not have iv pain meds listed. Pt adamant that that was one of the conditions for him to go to the snf. Called Dr Gladstone Lighter 's office to clarify the discharge meds to prevent delays and complications when transporters show up

## 2016-01-24 NOTE — Progress Notes (Signed)
Pt waiting for transport for transfer to [facility] this evening

## 2016-01-24 NOTE — Progress Notes (Signed)
Subjective: 3 Days Post-Op Procedure(s) (LRB): REMOVAL OF KNEE PROSTESIS WITH RIGHT KNEE PLACEMENT OF ANTIBIOTIC SPACER (Right) Patient reports pain as 4 on 0-10 scale.Knee wound looks fine. Afebrile and Recent surgery culturess show no growth. Will follow in office in one week.    Objective: Vital signs in last 24 hours: Temp:  [98.7 F (37.1 C)-99 F (37.2 C)] 99 F (37.2 C) (12/01 0446) Pulse Rate:  [72-76] 75 (12/01 0446) Resp:  [16-18] 16 (12/01 0446) BP: (119-134)/(61-81) 119/81 (12/01 0446) SpO2:  [93 %-99 %] 93 % (12/01 0446)  Intake/Output from previous day: 11/30 0701 - 12/01 0700 In: 5473.3 [P.O.:600; I.V.:4873.3] Out: 950 [Urine:950] Intake/Output this shift: No intake/output data recorded.   Recent Labs  01/22/16 0610 01/23/16 0344 01/24/16 0425  HGB 10.7* 10.4* 10.1*    Recent Labs  01/23/16 0344 01/24/16 0425  WBC 9.3 9.9  RBC 3.25* 3.16*  HCT 31.0* 30.5*  PLT 263 290    Recent Labs  01/23/16 0344 01/24/16 0425  NA 135 136  K 3.8 4.6  CL 105 102  CO2 26 28  BUN 9 10  CREATININE 1.22 1.33*  GLUCOSE 94 105*  CALCIUM 8.8* 9.4   No results for input(s): LABPT, INR in the last 72 hours.  Dorsiflexion/Plantar flexion intact No cellulitis present  Assessment/Plan: 3 Days Post-Op Procedure(s) (LRB): REMOVAL OF KNEE PROSTESIS WITH RIGHT KNEE PLACEMENT OF ANTIBIOTIC SPACER (Right) Up with therapy.Will follow in office in one week. Ambulate toe touch on Right. No bending of Right Knee.  Bryce Cheever A 01/24/2016, 7:13 AM

## 2016-01-24 NOTE — Progress Notes (Signed)
Peripherally Inserted Central Catheter/Midline Placement  The IV Nurse has discussed with the patient and/or persons authorized to consent for the patient, the purpose of this procedure and the potential benefits and risks involved with this procedure.  The benefits include less needle sticks, lab draws from the catheter, and the patient may be discharged home with the catheter. Risks include, but not limited to, infection, bleeding, blood clot (thrombus formation), and puncture of an artery; nerve damage and irregular heartbeat and possibility to perform a PICC exchange if needed/ordered by physician.  Alternatives to this procedure were also discussed.  Bard Power PICC patient education guide, fact sheet on infection prevention and patient information card has been provided to patient /or left at bedside.    PICC/Midline Placement Documentation  PICC Single Lumen [id number] PICC Right Basilic 46 cm 0 cm (Active)  Indication for Insertion or Continuance of Line Home intravenous therapies (PICC only) 01/24/2016 10:00 AM  Exposed Catheter (cm) 0 cm 01/24/2016 10:00 AM  Dressing Change Due 01/31/16 01/24/2016 10:00 AM       Jule Economy Horton 01/24/2016, 10:25 AM

## 2016-01-24 NOTE — Progress Notes (Signed)
Physical Therapy Treatment Patient Details Name: Juan Wiggins MRN: [id number] DOB: Jan 02, 1951 Today's Date: 01/24/2016    History of Present Illness 65 y.o. male with right knee pain presenting with septic joint, readmit, has been off of antibiotics 8 days. PMH is significant for hypothyroidism, alcohol abuse, Hepatitis B, Hepatitis C,  HTN, GERD, chronic pain syndrome, hx of TKA (both in 2015), hx of R septic knee (06/2015), major depression, anxiety, BPH.    PT Comments    Pt in high pain today. Offered to contact RN to administer pain meds before session but pt noted that he would like to wait. Pt c/o knee immobilizer being too low- readjusted to appropriate height before ambulation. Pt in pain while PT readjusted knee immobilizer but pt willing to ambulate short distance. Pt ambulated decreased distance with min guard today, likely 2/2 high pain. Ice applied and RN notified to give pt pain medications as per pt request. Pt continues to be a good candidate for SNF to maximize strength and mobility gains before returning to home.   Follow Up Recommendations  SNF     Equipment Recommendations  None recommended by PT    Recommendations for Other Services       Precautions / Restrictions Precautions Precautions: None Required Braces or Orthoses: Knee Immobilizer - Right Knee Immobilizer - Right: On at all times Restrictions Weight Bearing Restrictions: Yes RLE Weight Bearing: Touchdown weight bearing    Mobility  Bed Mobility Overal bed mobility: Independent             General bed mobility comments: Use of bed rail and increased time.  Transfers Overall transfer level: Needs assistance Equipment used: Rolling walker (2 wheeled) Transfers: Sit to/from Stand Sit to Stand: Min assist         General transfer comment: min assist from sit<>stand; bed elevated (pt=90%)  Ambulation/Gait Ambulation/Gait assistance: Min guard Ambulation Distance (Feet): 30  Feet Assistive device: Rolling walker (2 wheeled) Gait Pattern/deviations: Antalgic;Decreased weight shift to right;Decreased stance time - right Gait velocity: decreased Gait velocity interpretation: Below normal speed for age/gender General Gait Details: Good understanding of TDWB   Stairs            Wheelchair Mobility    Modified Rankin (Stroke Patients Only)       Balance Overall balance assessment: Needs assistance Sitting-balance support: No upper extremity supported;Feet supported Sitting balance-Leahy Scale: Normal Sitting balance - Comments: Pt able to sit EOB without UE assist   Standing balance support: Bilateral upper extremity supported;During functional activity Standing balance-Leahy Scale: Poor Standing balance comment: Pt requires B UE support to maintain balance                    Cognition Arousal/Alertness: Awake/alert Behavior During Therapy: WFL for tasks assessed/performed Overall Cognitive Status: Within Functional Limits for tasks assessed                      Exercises      General Comments        Pertinent Vitals/Pain Pain Assessment: 0-10 Pain Score: 8  Pain Location: R knee Pain Descriptors / Indicators: Aching Pain Intervention(s): Limited activity within patient's tolerance;Monitored during session;Repositioned;Ice applied;Patient requesting pain meds-RN notified (pt initially denied medications at start of session)    Home Living                      Prior Function  PT Goals (current goals can now be found in the care plan section) Acute Rehab PT Goals Patient Stated Goal: To go home PT Goal Formulation: With patient Time For Goal Achievement: 01/29/16 Potential to Achieve Goals: Good Progress towards PT goals: Progressing toward goals    Frequency    Min 5X/week      PT Plan Frequency needs to be updated    Co-evaluation             End of Session Equipment Utilized  During Treatment: Gait belt;Right knee immobilizer Activity Tolerance: Patient limited by pain Patient left: in chair;with call bell/phone within reach     Time: 1410-1441 PT Time Calculation (min) (ACUTE ONLY): 31 min  Charges:  $Gait Training: 23-37 mins                    G Codes:      Tonia Brooms 2016-01-30, 4:11 PM  Tonia Brooms, SPT [phone]

## 2016-01-24 NOTE — Progress Notes (Signed)
Per iv team nurse, picc line will be inserted tomorrow as they do not have a picc nurse on site today

## 2016-01-24 NOTE — Care Management Note (Signed)
Case Management Note  Patient Details  Name: Juan Wiggins MRN: [id number] Date of Birth: Aug 03, 1950  Subjective/Objective:                    Action/Plan:  Called [facility] [phone]  , no answer voice mail box full. Spoke with AD of [facility]. Zack [phone]. aware ID recommendations .  currently on 8mg /kg daptomycin due to possibly seizure from beta-lactams. He is now amenable to SNF for 2-3 wk  - plan to discharge on dapto 700mg  IV daily ( 8mg /kg/d) until duration of SNF stay. Ideally would like 6 wks - will need weekly cbc, bmp, and CK - will change him to oral linezolid 600mg  PO BID to start after his daptomycin is ginve - plan for a total of 6 wk - end date in January 4th   He will begin working on difficult to place.  Expected Discharge Date:                  Expected Discharge Plan:  Skilled Nursing Facility  In-House Referral:  Clinical Social Work  Discharge planning Services     Post Acute Care Choice:  NA Choice offered to:     DME Arranged:  N/A DME Agency:  NA  HH Arranged:  NA HH Agency:  NA  Status of Service:  In process, will continue to follow  If discussed at Long Length of Stay Meetings, dates discussed:    Additional Comments:  Marilu Favre, RN 01/24/2016, 9:42 AM

## 2016-01-24 NOTE — Progress Notes (Signed)
Complex discharge as pt is threatening to stay overnight if he does not have iv pain meds at discharge. Explained to pt that orthopedic doctor discharged him on po meds and not iv meds.

## 2016-01-24 NOTE — Progress Notes (Signed)
Report called and given to [facility] facility nurse,phone number [phone]. Pt waiting for transport to facility

## 2016-01-24 NOTE — Discharge Summary (Signed)
[facility] Discharge Summary  Patient name: Juan Wiggins Medical record number: [id number] Date of birth: 04-Oct-1950 Age: 65 y.o. Gender: male Date of Admission: 01/14/2016  Date of Discharge: 01/24/16  Admitting Physician: Kinnie Feil, MD  Primary Care Provider: Helane Rima, MD Consultants: Orthopedics, Infectious diseases  Indication for Hospitalization: Septic Right Knee  Discharge Diagnoses/Problem List:  Patient Active Problem List   Diagnosis Date Noted  . Seizure ([place])   . IVDU (intravenous drug user)   . Staphylococcal arthritis of right knee ([place])   . Septic arthritis ([place]) 01/14/2016  . Lumbar spine pain   . Difficulty in walking, not elsewhere classified   . Infection of total knee replacement ([place]) 01/02/2016  . Pleural effusion   . Left upper quadrant pain   . Pain   . 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 Infection 12/29/2015  . Lactic acidosis   . Pyogenic arthritis of right knee joint ([place]) 07/18/2015  . Bacteremia due to Streptococcus 07/18/2015  . Cellulitis of right upper extremity 07/18/2015  . Chronic hepatitis C without hepatic coma ([place]) 07/18/2015  . Anemia 07/18/2015  . Opacity of lung on imaging study, bilateral 07/18/2015  . Anxiety state 07/18/2015  . History of total knee arthroplasty 07/16/2015  . Severe sepsis ([place]) 07/14/2015  . Moderate episode of recurrent major depressive disorder ([place])   . Chronic pain syndrome 05/21/2014  . Essential hypertension 08/28/2013  . GERD (gastroesophageal reflux disease) 08/28/2013  . Osteoarthritis of left knee 08/23/2013  . Total knee replacement status 08/23/2013  . Alcohol dependence ([place]) 07/14/2013  . Alcohol dependency ([place]) 07/07/2013  . Major depression 07/07/2013  . Varicose veins of lower extremities with other complications 19/14/7829  . Recurrent falls 04/14/2011  . Hypotension 04/13/2011  . ARF (acute renal failure)  ([place]) 04/13/2011  . Hypothyroidism 04/13/2011  . Prolapsed and thrombosed 09/03/2010    Disposition: [facility]  Discharge Condition: Stable  Discharge Exam:  General: NAD, lies comfortably in bed eating breakfast Cardiovascular: RRR, no m/r/g Respiratory: CTA bil, no W/R/R Abdomen: soft, nontender this AM, nd, normoactive BS Extremities:+right with with staples over R knee, no discharge or bleeding from wound Neuro: CN II-XII  intact  Brief Hospital Course:  Juan Agrusa Jonesis a 65 y.o.malewith recent history of admission for septic right knee, discharged on IM rocephin x6 weeks after refusing SNF (cocaine on UDS so unable to discharge the patient with a PICC line), who presented with right knee pain and septic joint after not filling his antibiotic prescription after discharge from the hospital 8 days prior. Patient had gone about one week without any antibiotic coverage for his septic joint.  On admission the patient was started on cefazolin 2g q8h and rapidly taken for a joint aspiration by orthopedics for synovial fluid cultures, which subsequently grew rare staph aureus.  The patient underwent joint clean-out by orthopedics on 11/23.    On 11/26, he was noted to have 20 seconds of tonic-clonic seizure activity overnight which resolved on its own, did not require medical intervention. Neurology was consulted.  EEG was normal, MRI was negative for evidence of intracranial pathology that may incite seizure.  Neurology stated this is considered an unprovoked seizure, no seizure medications required unless the patient were to seize again. He was seizure-free throughout the remainder of his hospitalization.   It was noted that cephalosporins may lower the seizure threshold, which may have led to this patient's seizure.  As a result, Ancef was discontinued and the patient was transitioned to daptomycin per ID recommendations. The patient was agreeable to skilled nursing at discharge, and  he was sent with a PICC to continue receiving daptomycin after discharge.  Issues for Follow Up:  1. Septic R Knee:  Patient discharged with PICC in place, Daptomycin to be continued through January 4th.  (Or as long as he agrees to stay at the SNF).   1. Labs orders weekly per ID MD: CBC with diff, CMP, CRP, ESR, CK. 2. Remove PICC at completion of antibiotics OR prior to discharge from SNF if patient attempts to leave prior to course completion.   2. HTN: Patient previously on propanolol which was discontinued at recent hospital admission due to active cocaine use per UDS (patient denies.)  Hypertensive this admission so started on Norvasc which was titrated up to 10 mg daily. Please follow up BP. 3. Hypothyroidism: Previously 0.203 on last admission with 200 mcg synthroid daily however dose not changed given acute illness.  repeat this admission TSH 6.303, fT4 WNL at 1.07.  Synthroid dose kept same. Please follow up TSH in 6 weeks. 4. Pain: please continue to decrease his pain regimen. Discharging with a 25% decrease from regimen he was using while inpatient. I would NOT discharge from SNF with any pain medicines given history of drug abuse.  Significant Procedures:  11/22 -  joint aspiration by orthopedics 11/23 - Surgical I&D R knee by orthopedics 11/28 - R knee prosthesis removal and placement of antibiotic beads by ortho  Significant Labs and Imaging:   Recent Labs Lab 01/22/16 0610 01/23/16 0344 01/24/16 0425  WBC 11.8* 9.3 9.9  HGB 10.7* 10.4* 10.1*  HCT 32.4* 31.0* 30.5*  PLT 306 263 290    Recent Labs Lab 01/19/16 0137  01/20/16 0423 01/21/16 0442 01/22/16 0610 01/23/16 0344 01/24/16 0425  NA 136  < > 139 138 137 135 136  K 3.8  < > 4.2 3.9 5.2* 3.8 4.6  CL 105  < > 108 108 104 105 102  CO2 24  < > _0 GLUCOSE 105*  < > 108* 90 128* 94 105*  BUN 9  < > _1 CREATININE 1.57*  < > 1.47* 1.30* 1.35* 1.22 1.33*  CALCIUM 8.5*  < > 8.6* 8.7* 9.1  8.8* 9.4  MG 1.8  --   --   --   --   --   --   ALKPHOS 123  --   --   --   --   --   --   AST 27  --   --   --   --   --   --   ALT <5*  --   --   --   --   --   --   ALBUMIN 2.4*  --   --   --   --   --   --   < > = values in this interval not displayed.    Results/Tests Pending at Time of Discharge: None  Discharge Medications:    Medication List    STOP taking these medications   oxyCODONE 5 MG immediate release tablet Commonly known as:  Oxy IR/ROXICODONE   promethazine 12.5 MG tablet Commonly known as:  PHENERGAN   propranolol ER 60 MG 24 hr capsule Commonly known as:  INDERAL LA     TAKE these medications   albuterol  108 (90 Base) MCG/ACT inhaler Commonly known as:  PROVENTIL HFA;VENTOLIN HFA Inhale 2-4 puffs into the lungs daily as needed for shortness of breath.   amLODipine 10 MG tablet Commonly known as:  NORVASC Take 1 tablet (10 mg total) by mouth daily.   aspirin 325 MG tablet Take 1 tablet (325 mg total) by mouth 2 (two) times daily. What changed:  when to take this   bisacodyl 5 MG EC tablet Commonly known as:  DULCOLAX Take 1 tablet (5 mg total) by mouth daily as needed for moderate constipation.   Cholecalciferol 2000 units Caps Take 6,000 Units by mouth daily.   DAPTOmycin 700 mg in sodium chloride 0.9 % 100 mL Inject 700 mg into the vein daily. Start taking on:  01/25/2016   escitalopram 20 MG tablet Commonly known as:  LEXAPRO Take 20 mg by mouth daily.   hydrOXYzine 25 MG tablet Commonly known as:  ATARAX/VISTARIL Take 1 tablet (25 mg total) by mouth 3 (three) times daily as needed. What changed:  when to take this   levothyroxine 200 MCG tablet Commonly known as:  SYNTHROID, LEVOTHROID Take 200 mcg by mouth daily before breakfast.   methocarbamol 500 MG tablet Commonly known as:  ROBAXIN Take 1 tablet (500 mg total) by mouth every 6 (six) hours as needed for muscle spasms.   ondansetron 4 MG tablet Commonly known as:   ZOFRAN TAKE 1 TABLET BY MOUTH EVERY 8 HOURS AS NEEDED FOR NAUSEA OR VOMITING.   oxyCODONE-acetaminophen 7.5-325 MG tablet Commonly known as:  PERCOCET Take 1 tablet by mouth every 4 (four) hours as needed for moderate pain or severe pain.   senna 8.6 MG Tabs tablet Commonly known as:  SENOKOT Take 1 tablet (8.6 mg total) by mouth daily as needed for mild constipation.   silodosin 8 MG Caps capsule Commonly known as:  RAPAFLO Take 8 mg by mouth daily with breakfast.   Sofosbuvir-Velpatasvir 400-100 MG Tabs Commonly known as:  EPCLUSA Take 1 tablet by mouth daily.   tenofovir 300 MG tablet Commonly known as:  VIREAD Take 1 tablet (300 mg total) by mouth daily.   valACYclovir 1000 MG tablet Commonly known as:  VALTREX Take 1,000 mg by mouth See admin instructions. Take 1 tablet by mouth daily for flares - until one day past outbreak   zolpidem 5 MG tablet Commonly known as:  AMBIEN Take 1 tablet (5 mg total) by mouth at bedtime as needed for sleep. What changed:  medication strength  how much to take       Discharge Instructions: Please refer to Patient Instructions section of EMR for full details.  Patient was counseled important signs and symptoms that should prompt return to medical care, changes in medications, dietary instructions, activity restrictions, and follow up appointments.   Follow-Up Appointments:  Contact information for follow-up providers    GIOFFRE,RONALD A, MD. Schedule an appointment as soon as possible for a visit in 1 week(s).   Specialty:  Orthopedic Surgery Contact information: [address] [place] [address] [phone]        Carlyle Basques, MD. Schedule an appointment as soon as possible for a visit in 3 week(s).   Specialty:  Infectious Diseases Why:  to discuss HepC labs and medicine Contact information: [place] [address] [phone]            Contact information for  after-discharge care    Destination    [facility] Follow up.   Specialty:  [place] information: [address] [place] Cletis Media, MD 01/24/2016, 6:22 PM PGY-2, [place]

## 2016-01-24 NOTE — Progress Notes (Signed)
Per Allergy list and reported by pt. He has an allergy to heparin. Pt will be transferring to rehab to continue ATB therapy. D/T known allergy, pt single lumen PICC was flushed with 10cc NS and nurse/pt was notified. VU. Fran Lowes, RN VAST

## 2016-01-24 NOTE — Progress Notes (Signed)
Pt for discharge going to [facility], informed the IV team prior to discharge they need to flush the IV with heparin but the pt allergic to heparin, ordered different meds waiting from pharmacy replace to heparin, pt alert and oriented given all personal belongings , discharge summary and signed the docs, and prescription for percocet, no s/s of distress noted, escorted by PTAR.

## 2016-01-24 NOTE — Progress Notes (Signed)
Mild tremors to bilateral hands noted during IV placement. Patient OOB for BRP and  request PRN medication for nausea.

## 2016-01-27 ENCOUNTER — Telehealth (HOSPITAL_COMMUNITY): Payer: Self-pay

## 2016-01-27 NOTE — Telephone Encounter (Signed)
Medication refill request - Fax refill request received for patient's Hydroxyzine.  Last provided for one month 12/18/15 and patient no showed for evaluation 01/20/16.  No current appointment scheduled.

## 2016-01-27 NOTE — Telephone Encounter (Signed)
Please contact the patient to schedule the appointment in Dec or January (if no available slots in Dec). I would not order refills unless we are sure that he will come back to the appointment.

## 2016-01-27 NOTE — Telephone Encounter (Signed)
that Mr. Delligatti was "in the hospital". No identifiable information left regarding provider's office.

## 2016-01-28 ENCOUNTER — Other Ambulatory Visit: Payer: Self-pay | Admitting: *Deleted

## 2016-01-28 ENCOUNTER — Non-Acute Institutional Stay (SKILLED_NURSING_FACILITY): Payer: 59 | Admitting: Internal Medicine

## 2016-01-28 ENCOUNTER — Ambulatory Visit: Payer: Self-pay | Admitting: Internal Medicine

## 2016-01-28 ENCOUNTER — Encounter: Payer: Self-pay | Admitting: Internal Medicine

## 2016-01-28 DIAGNOSIS — R569 Unspecified convulsions: Secondary | ICD-10-CM

## 2016-01-28 DIAGNOSIS — Z9189 Other specified personal risk factors, not elsewhere classified: Secondary | ICD-10-CM | POA: Diagnosis not present

## 2016-01-28 DIAGNOSIS — M00061 Staphylococcal arthritis, right knee: Secondary | ICD-10-CM | POA: Diagnosis not present

## 2016-01-28 NOTE — Assessment & Plan Note (Signed)
Daptomycin will be continued through the PICC line until 02/27/2016.

## 2016-01-28 NOTE — Assessment & Plan Note (Addendum)
Risk discussed with him. His response was " without the pain medicine there is no quality of life". Because of significantly increased risk of > 80% as determined by the opioid-overdose calculator; controlled substances should NOT be called in by the on-call provider for this patient. For any breakthrough pain Tylenol NOT to exceed 2 g per day; topical anti-inflammatory agent (only if OK with Dr Gladstone Lighter); short course  Meloxicam or Celebrex could be considered as chart documents these as options. Percocet will be decreased to 5/325 every 4 hours and then weaned further

## 2016-01-28 NOTE — Patient Instructions (Signed)
See Current Assessment & Plan in Problem List under specific Diagnosis 

## 2016-01-28 NOTE — Progress Notes (Signed)
Facility Location: [facility]  Room Number: 120-A  Code Status: Full Code   [id number], Bryn Gulling, MD [place] [id number]    This is a comprehensive admission note to [facility] performed on this date less than 30 days from date of admission. Included are preadmission medical/surgical history;reconciled medication list; family history; social history and comprehensive review of systems.  Corrections and additions to the records were documented . Comprehensive physical exam was also performed. Additionally a clinical summary was entered for each active diagnosis pertinent to this admission in the Problem List to enhance continuity of care.   HPI: The patient was hospitalized 11/21-12/1/17 for septic right knee ; he received IV antibiotics with cefazolin and aspiration performed. C&S  grew rare staph aureus. A joint "cleanout" was completed by orthopedics on 11/23 On 11/26 he had 20 seconds of tonic/clonic seizure activity which resolved spontaneously. EEG was normal. MRI was negative for acute process. This diagnosis was an unprovoked seizure and no medication was felt to be necessary unless he had a recurrence.It was questioned whether the cephalosporins had lowered his seizure threshold.  Initially he was to be discharged on IM Rocephin for 6 weeks, but he agreed to enter the facility and receive  daptomycin via he PICC line. Ancef was discontinued and he was placed on daptomycin as per infectious disease recommendations. He was discharged to SNF with the PICC line with daptomycin to be continued through January 4. Pain medications were reduced by 25 % at discharge and further weaning was recommended by the discharging physician. Also that time zolpidem was discontinued.  Psychiatry NP has seen the patient and recommended trazodone rather than Ambien. He's been on morphine 30 mEq every 12 hours and was discharged  on Percocet 5/325 every 6 hours as needed. The on-call physician was called and the pain medicine was increased to 7.5 every 4 hours. He has been taking this on a regular basis. With the morphine @ 12 hours this dose of narcotics would exceed 100 MME /day.  He has a past history of alcohol abuse and pancreatitis.Other medical diagnoses include hypothyroidism, hypertension, hepatitis A, B,& C. He has long history of anxiety and depression.  Urine drug screen while hospitalized was positive for cocaine, he denies using cocaine. He has had multiple orthopedic surgeries of shoulder, elbow, or sacral spine.  Social history: Reviewed in Epic  Family history: Reviewed  Review of systems: His focuses on residual pain in the right knee at the site of the procedure He is concerned about bruising over the right forearm which he believes to be a fungal infection.  He had watery stool yesterday. He's not had a bowel movement today. He has numbness and tingling in his feet.  Constitutional: No fever,significant weight change, fatigue  Eyes: No redness, discharge, pain, vision change ENT/mouth: No nasal congestion,  purulent discharge, earache,change in hearing ,sore throat  Cardiovascular: No chest pain, palpitations,paroxysmal nocturnal dyspnea, claudication, edema  Respiratory: No cough, sputum production,hemoptysis, DOE , significant snoring,apnea  Gastrointestinal: No heartburn,dysphagia,abdominal pain, nausea / vomiting,rectal bleeding, melena,change in bowels Genitourinary: No dysuria,hematuria, pyuria,  incontinence, nocturia Dermatologic: No rash, pruritus Neurologic: No dizziness,headache,syncope, seizures, numbness , tingling Psychiatric: see Psych NP noteHematologic/lymphatic: No significant lymphadenopathy,abnormal bleeding Allergy/immunology: No itchy/ watery eyes, significant sneezing, urticaria, angioedema  Physical exam:  Pertinent or positive findings: pattern alopecia is present.  Pupils are pinpoint.  He is slightly tremulous. Pedal pulses are slightly decreased. A  soft brace is present over the right lower extremity. PICC line is present in the right upper extremity.  There is a well-healed op scar present over the left knee. He has scattered small ecchymoses over the forearms. There is no clinical evidence of a fungal infection.   General appearance:Adequately nourished; no acute distress , increased work of breathing is present.   Lymphatic: No lymphadenopathy about the head, neck, axilla . Eyes: No conjunctival inflammation or lid edema is present. There is no scleral icterus. Ears:  External ear exam shows no significant lesions or deformities.   Nose:  External nasal examination shows no deformity or inflammation. Nasal mucosa are pink and moist without lesions ,exudates Oral exam: lips and gums are healthy appearing.There is no oropharyngeal erythema or exudate . Neck:  No thyromegaly, masses, tenderness noted.    Heart:  Normal rate and regular rhythm. S1 and S2 normal without gallop, murmur, click, rub .  Lungs:Chest clear to auscultation without wheezes, rhonchi,rales , rubs. Abdomen:Bowel sounds are normal. Abdomen is soft and nontender with no organomegaly, hernias,masses. GU: deferred . Extremities:  No cyanosis, clubbing,edema  Neurologic exam : Balance,Rhomberg,finger to nose testing could not be completed due to clinical state Skin: Warm & dry w/o tenting. No significant lesions or rash.  See clinical summary under each active problem in the Problem List with associated updated therapeutic plan. I am concerned as he now exceeds 100 MME of morphine/oxycodone a day. He has risk of adverse reaction including death exceeds 80%

## 2016-01-28 NOTE — Assessment & Plan Note (Signed)
Monitor for recurrence

## 2016-01-29 ENCOUNTER — Other Ambulatory Visit: Payer: Self-pay | Admitting: *Deleted

## 2016-01-29 ENCOUNTER — Encounter: Payer: Self-pay | Admitting: Internal Medicine

## 2016-01-29 DIAGNOSIS — F1911 Other psychoactive substance abuse, in remission: Secondary | ICD-10-CM | POA: Insufficient documentation

## 2016-01-29 MED ORDER — OXYCODONE-ACETAMINOPHEN 5-325 MG PO TABS: ORAL_TABLET | ORAL | 0 refills | Status: DC

## 2016-01-29 NOTE — Telephone Encounter (Signed)
[facility] [phone] Fax: [phone]  

## 2016-01-31 ENCOUNTER — Other Ambulatory Visit: Payer: Self-pay | Admitting: *Deleted

## 2016-01-31 MED ORDER — OXYCODONE-ACETAMINOPHEN 5-325 MG PO TABS: ORAL_TABLET | ORAL | 0 refills | Status: DC

## 2016-01-31 NOTE — Telephone Encounter (Signed)
[facility] [phone] Fax: [phone]  

## 2016-02-04 ENCOUNTER — Telehealth: Payer: Self-pay | Admitting: *Deleted

## 2016-02-04 NOTE — Telephone Encounter (Signed)
Patient called stating he wants to leave the skilled nursing facility and be home for the holidays. His IV antibiotic was suppose to end in early January and he said it would be ending two weeks early. He is requesting to be switched to an oral antibiotic. Please advise

## 2016-02-05 ENCOUNTER — Telehealth: Payer: Self-pay | Admitting: Pharmacist Clinician (PhC)/ Clinical Pharmacy Specialist

## 2016-02-05 NOTE — Telephone Encounter (Signed)
Discuss with Dr. Baxter Flattery today about Juan Wiggins' issue about wanting to leave [facility]. The plan was to use ID dapto until 02/27/16 but we got a feeling that he will leave soon even before he got to [place]. D/w Dr. Linna Darner today about our plan. If pt is to leave, PICC needs to be pulled and he can come here to get his supply of zyvox that we have here to finish out his schedule course of abx. He has a major drug use history.

## 2016-02-05 NOTE — Telephone Encounter (Signed)
Juan Wiggins, pharmacist will speak to Dr. Baxter Flattery and get back to patient regarding switching to oral antibiotic. Juan Wiggins

## 2016-02-06 ENCOUNTER — Telehealth: Payer: Self-pay | Admitting: *Deleted

## 2016-02-06 NOTE — Telephone Encounter (Signed)
Patient called about his oral medication and advised him will have the pharmacist give him a call in morning to let him know about how to get the medication.

## 2016-02-07 ENCOUNTER — Encounter: Payer: Self-pay | Admitting: Pharmacist Clinician (PhC)/ Clinical Pharmacy Specialist

## 2016-02-07 ENCOUNTER — Telehealth: Payer: Self-pay | Admitting: Pharmacist Clinician (PhC)/ Clinical Pharmacy Specialist

## 2016-02-07 NOTE — Telephone Encounter (Signed)
Dropped off his zyvox at [facility] to his nurse Kyung Rudd with specific instructions of not to give it to him until/if he leaves the facility with his PICC removed. He is supposed to finished out the course of abx on 1/4. I made an appt to f/u with Dr. Baxter Flattery on 1/16 and I told him this also.

## 2016-02-07 NOTE — Telephone Encounter (Signed)
Juan Wiggins said that he plans to leave next Tues and would like to have his meds before he leaves. I'm going to try to drop it off at [facility].

## 2016-02-10 ENCOUNTER — Telehealth: Payer: Self-pay | Admitting: *Deleted

## 2016-02-10 NOTE — Telephone Encounter (Signed)
Patient called and advised his knee is swollen again. He advised it is swollen painful and tight. He also states it is purple and feels like it is about to burst. He still has his PICC for now and wants to know what he should do now. He is supposed to switch to oral soon. Advised will call the doctor and call him back.

## 2016-02-11 ENCOUNTER — Non-Acute Institutional Stay (SKILLED_NURSING_FACILITY): Payer: 59 | Admitting: Internal Medicine

## 2016-02-11 ENCOUNTER — Encounter: Payer: Self-pay | Admitting: Internal Medicine

## 2016-02-11 DIAGNOSIS — M00061 Staphylococcal arthritis, right knee: Secondary | ICD-10-CM

## 2016-02-11 DIAGNOSIS — F331 Major depressive disorder, recurrent, moderate: Secondary | ICD-10-CM | POA: Diagnosis not present

## 2016-02-11 DIAGNOSIS — G4733 Obstructive sleep apnea (adult) (pediatric): Secondary | ICD-10-CM | POA: Insufficient documentation

## 2016-02-11 DIAGNOSIS — Z9189 Other specified personal risk factors, not elsewhere classified: Secondary | ICD-10-CM | POA: Diagnosis not present

## 2016-02-11 NOTE — Patient Instructions (Signed)
See Current Assessment & Plan in Problem List under specific Diagnosis 

## 2016-02-11 NOTE — Assessment & Plan Note (Signed)
Risk of respiratory suppression with opioids &  untreated sleep apnea discussed

## 2016-02-11 NOTE — Assessment & Plan Note (Addendum)
  Daptomycin via PICC line at [facility] 12/1-12/19/17 Dr Pham/ Dr Baxter Flattery , ID, PROVIDED Zyvox samples  by mouth until 02/27/16; follow-up with Dr. Baxter Flattery 03/10/16 02/10/16 Dr.Gioffre aspirated serosanguineous fluid from the right knee and sent for ultrasound sensitivity. No purulence noted clinically.

## 2016-02-11 NOTE — Assessment & Plan Note (Addendum)
02/10/16 morphine and tramadol discontinued as per Dr Gladstone Lighter , Orthopedics Oxycodone 5/325 mg one every 6 hours as needed prescribed by Dr. Gladstone Lighter. Handwritten notes on the prescription designated this should be at least a 15 day supply.  I gave him the prescription myself. I explained his risk of over 80% of respiratory suppression and possible death over next 6 months. He had no interest in pursuing narcotic weaning program because of "my severe pain"

## 2016-02-11 NOTE — Assessment & Plan Note (Signed)
Psychiatry nurse practitioner prescribed trazodone 150 mg at bedtime in place of Ambien

## 2016-02-11 NOTE — Progress Notes (Signed)
The patient is being discharged from [facility] on this date by Unice Cobble MD.  PCP: Darra Lis ,MD [place], [id number]  The medical history in this facility was reviewed and summarized and medical problem list was updated. Time spent and note content is documented as follows.  Summary of [place] medical records: He was hospitalized 11/21-12/1/17 with septic right knee. Aspiration was performed and he was placed on intravenous antibiotics. C&S grew rare staph aureus. Dr Gladstone Lighter completed a "joint cleanout" 11/23 On 11/26 he had 20 seconds of tonic/clonic seizure activity which resolved spontaneously. EEG was normal and MRI was negative for acute process. Unprovoked seizure was diagnosed No medications felt to be necessary unless he had a recurrence of seizure activity. It was questioned whether the cephalosporins had lowered his seizure threshold. He was discharged to the SNF for daptomycin through the PICC line; the daptomycin was to be  continued through 02/27/2016. After discussion with his ID restless daptomycin was changed to Zyvox beginning 12/19. At discharge the patient's pain meds were decreased by 25%. Further weaning was recommended by the discharging physician. At that time zolpidem was discontinued. Trazodone had been recommended by the psychiatry nurse practitioner in place of Ambien. When he entered the facility he was on morphine 30 mEq every 12 hours and Percocet 5/325 every 6 hours. The evening of his admission he asked the on-call provider to be called to increase the Percocet. This was increased to 7.5 mg every 4 hours. This was reviewed. At these doses he would exceed a morphine equivalent dose of greater than 100 mg day. Past history includes alcohol abuse, and drug screen positive for cocaine 3, and pancreatitis. He has a history of hepatitis A, B, C. He has long-standing history of anxiety and  depression. In the SNF the narcotics were weaned, but he saw Dr Gladstone Lighter for pain and swelling in the operated knee 12/18 Oxycodone 5/325 mg was prescribed as #60 pills. This  was designated as @ least a 15 day supply. Morphine 20 mg every 12 hours and tramadol were discontinued by Dr Gladstone Lighter. Dr Dinah Beers with ID dropped off oral Zyvox at [facility] on 12/15 . IV daptomycin was to be discontinued 12/19 and the Zyvox until 1/4. He was to follow-up with Dr. Baxter Flattery on 1/16. He was informed of this by Dr. Devin Going.   The last labs on record were 12/1: creatinine was 1.33 and glucose 105, and hematocrit 30.5. The other labs were normal.   Review of systems:Pertinent or active symptoms include complaints of ongoing knee pain. He states the fluid moved from the knee into the lower thigh  Negative [id number]: No fever,significant weight change, fatigue  Eyes: No redness, discharge, pain, vision change ENT/mouth: No nasal congestion,  purulent discharge, earache,change in hearing ,sore throat  Cardiovascular: No chest pain, palpitations,paroxysmal nocturnal dyspnea, claudication, edema  Respiratory: No cough, sputum production,hemoptysis, DOE , significant snoring,apnea  Gastrointestinal: No heartburn,dysphagia,abdominal pain, nausea / vomiting,rectal bleeding, melena,change in bowels Genitourinary: No dysuria,hematuria, pyuria,  incontinence, nocturia Dermatologic: No rash, pruritus, change in appearance of skin Neurologic: No dizziness,headache,syncope, seizures, numbness , tingling Psychiatric: No significant anxiety , depression, insomnia, anorexia Endocrine: No change in hair/skin/ nails, excessive thirst, excessive hunger, excessive urination  Hematologic/lymphatic: No significant bruising, lymphadenopathy,abnormal bleeding Allergy/immunology: No itchy/ watery eyes, significant sneezing, urticaria, angioedema  Physical exam:  Pertinent or positive findings: When I entered the room he was  asleep with intermittent  snoring. His respirations were markedly shallow, slow, and quiet. He did not wake up to until I began to auscultate his chest .Bowels soundswere hyperactive.  Dorsalis pedis pulses are slightly decreased. The right lower extremity is in a soft cast.  General appearance:Adequately nourished; no acute distress , increased work of breathing is present.   Lymphatic: No lymphadenopathy about the head, neck, axilla . Eyes: No conjunctival inflammation or lid edema is present. There is no scleral icterus. Ears:  External ear exam shows no significant lesions or deformities.   Nose:  External nasal examination shows no deformity or inflammation. Nasal mucosa are pink and moist without lesions ,exudates Oral exam: lips and gums are healthy appearing.There is no oropharyngeal erythema or exudate . Neck:  No thyromegaly, masses, tenderness noted.    Heart:  Normal rate and regular rhythm. S1 and S2 normal without gallop, murmur, click, rub .  Lungs:Chest clear to auscultation without wheezes, rhonchi,rales , rubs. Abdomen:Bowel sounds are normal. Abdomen is soft and nontender with no organomegaly, hernias,masses. GU: deferred  Extremities:  No cyanosis, clubbing,edema  Neurologic exam : Balance,Rhomberg,finger to nose testing could not be completed due to clinical state Skin: Warm & dry w/o tenting. No significant lesions or rash.  See clinical summary of Discharge Diagnoses in the Problem List with associated updated therapeutic plan  Discharge instructions were  provided. Follow-up will be by the primary care physician in 10 - 14 days.He has an appointment to see Dr.Gioffre 02/21/16. Dr Charlestine Night Rx for oxycodone was given to him personally by me. He was given the samples of Zyvox 600 mg #60 one twice  a day & prescription for trazodone 150 mg daily at bedtime # 30. Antiviral medications will come from the [facility]. Refills of any maintenance medications will come from his  PCP, Dr. Lavone Neri.

## 2016-02-27 ENCOUNTER — Other Ambulatory Visit (HOSPITAL_COMMUNITY): Payer: Self-pay

## 2016-02-27 MED ORDER — ZOLPIDEM TARTRATE 10 MG PO TABS: ORAL_TABLET | ORAL | 0 refills | Status: DC

## 2016-03-10 ENCOUNTER — Encounter: Payer: Self-pay | Admitting: Internal Medicine

## 2016-03-10 ENCOUNTER — Ambulatory Visit (INDEPENDENT_AMBULATORY_CARE_PROVIDER_SITE_OTHER): Payer: 59 | Admitting: Internal Medicine

## 2016-03-10 VITALS — BP 118/67 | HR 96 | Temp 98.4°F | Ht 71.5 in | Wt 185.9 lb

## 2016-03-10 DIAGNOSIS — N183 Chronic kidney disease, stage 3 unspecified: Secondary | ICD-10-CM

## 2016-03-10 DIAGNOSIS — B182 Chronic viral hepatitis C: Secondary | ICD-10-CM

## 2016-03-10 DIAGNOSIS — T8450XS Infection and inflammatory reaction due to unspecified internal joint prosthesis, sequela: Secondary | ICD-10-CM

## 2016-03-10 DIAGNOSIS — B181 Chronic viral hepatitis B without delta-agent: Secondary | ICD-10-CM

## 2016-03-10 DIAGNOSIS — K4090 Unilateral inguinal hernia, without obstruction or gangrene, not specified as recurrent: Secondary | ICD-10-CM

## 2016-03-10 LAB — CBC WITH DIFFERENTIAL/PLATELET
BASOS PCT: 0 %
Basophils Absolute: 0 cells/uL (ref 0–200)
EOS PCT: 3 %
Eosinophils Absolute: 366 cells/uL (ref 15–500)
HCT: 29.7 % — ABNORMAL LOW (ref 38.5–50.0)
Hemoglobin: 9.6 g/dL — ABNORMAL LOW (ref 13.2–17.1)
LYMPHS ABS: 1220 {cells}/uL (ref 850–3900)
LYMPHS PCT: 10 %
MCH: 30.5 pg (ref 27.0–33.0)
MCHC: 32.3 g/dL (ref 32.0–36.0)
MCV: 94.3 fL (ref 80.0–100.0)
MONO ABS: 1098 {cells}/uL — AB (ref 200–950)
MPV: 9.4 fL (ref 7.5–12.5)
Monocytes Relative: 9 %
NEUTROS PCT: 78 %
Neutro Abs: 9516 cells/uL — ABNORMAL HIGH (ref 1500–7800)
Platelets: 486 10*3/uL — ABNORMAL HIGH (ref 140–400)
RBC: 3.15 MIL/uL — AB (ref 4.20–5.80)
RDW: 15.3 % — AB (ref 11.0–15.0)
WBC: 12.2 10*3/uL — AB (ref 3.8–10.8)

## 2016-03-10 LAB — COMPLETE METABOLIC PANEL WITH GFR
ALT: 6 U/L — AB (ref 9–46)
AST: 14 U/L (ref 10–35)
Albumin: 3.2 g/dL — ABNORMAL LOW (ref 3.6–5.1)
Alkaline Phosphatase: 102 U/L (ref 40–115)
BUN: 16 mg/dL (ref 7–25)
CHLORIDE: 103 mmol/L (ref 98–110)
CO2: 26 mmol/L (ref 20–31)
CREATININE: 1.73 mg/dL — AB (ref 0.70–1.25)
Calcium: 9.1 mg/dL (ref 8.6–10.3)
GFR, Est African American: 48 mL/min — ABNORMAL LOW (ref >=60)
GFR, Est Non African American: 41 mL/min — ABNORMAL LOW (ref >=60)
Glucose, Bld: 130 mg/dL — ABNORMAL HIGH (ref 65–99)
POTASSIUM: 4.7 mmol/L (ref 3.5–5.3)
SODIUM: 136 mmol/L (ref 135–146)
Total Bilirubin: 0.6 mg/dL (ref 0.2–1.2)
Total Protein: 6.7 g/dL (ref 6.1–8.1)

## 2016-03-10 NOTE — Progress Notes (Signed)
Patient ID: Juan Wiggins, male   DOB: 10-21-1950, 66 y.o.   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 He had effusiion drained on 12/18 - though no fluid count to see if still residual infection. He is scheduled for new implant by dr Alvan Dame on 2/12  Outpatient Encounter Prescriptions as of 03/10/2016  Medication Sig  . albuterol (PROVENTIL HFA;VENTOLIN HFA) 108 (90 Base) MCG/ACT inhaler Inhale 2-4 puffs into the lungs daily as needed for shortness of breath.   Marland Kitchen amLODipine (NORVASC) 10 MG tablet Take 1 tablet (10 mg total) by mouth daily.  Marland Kitchen aspirin 325 MG tablet Take 1 tablet (325 mg total) by mouth 2 (two) times daily.  Marland Kitchen escitalopram (LEXAPRO) 20 MG tablet Take 20 mg by mouth daily.   Marland Kitchen levothyroxine (SYNTHROID, LEVOTHROID) 200 MCG tablet Take 200 mcg by mouth daily before breakfast.  . oxyCODONE (OXY IR/ROXICODONE) 5 MG immediate release tablet Take 5 mg by mouth every 6 (six) hours as needed for moderate pain or severe pain.  Marland Kitchen RAPAFLO 8 MG CAPS capsule   . Sofosbuvir-Velpatasvir (EPCLUSA) 400-100 MG TABS Take 1 tablet by mouth daily.  Marland Kitchen tenofovir (VIREAD) 300 MG tablet Take 1 tablet (300 mg total) by mouth daily.  . valACYclovir (VALTREX) 1000 MG tablet Take 1,000 mg by mouth See admin instructions. Take 1 tablet by mouth daily for flares - until one day past outbreak  . zolpidem (AMBIEN) 10 MG tablet Take 1/2 to 1 tablet by mouth as needed at bedtime  . bisacodyl (DULCOLAX) 5 MG EC tablet Take 1 tablet (5 mg total) by mouth daily as needed for moderate constipation. (Patient not taking: Reported on 03/10/2016)  . Cholecalciferol 2000 units CAPS Take 6,000 Units by mouth daily.   Marland Kitchen DAPTOmycin 700 mg in sodium chloride 0.9 % 100 mL Inject 700 mg  into the vein daily. (Patient not taking: Reported on 03/10/2016)  . hydrOXYzine (ATARAX/VISTARIL) 25 MG tablet Take 1 tablet (25 mg total) by mouth 3 (three) times daily as needed. (Patient not taking: Reported on 03/10/2016)  . meloxicam (MOBIC) 7.5 MG tablet Take one tablet by mouth every 12 hours for joint pain  . methocarbamol (ROBAXIN) 500 MG tablet Take 1 tablet (500 mg total) by mouth every 6 (six) hours as needed for muscle spasms. (Patient not taking: Reported on 03/10/2016)  . ondansetron (ZOFRAN) 4 MG tablet TAKE 1 TABLET BY MOUTH EVERY 8 HOURS AS NEEDED FOR NAUSEA OR VOMITING. (Patient not taking: Reported on 03/10/2016)  . senna (SENOKOT) 8.6 MG TABS tablet Take 1 tablet (8.6 mg total) by mouth daily as needed for mild constipation. (Patient not taking: Reported on 03/10/2016)  . tamsulosin (FLOMAX) 0.4 MG CAPS capsule Take 0.4 mg by mouth daily.  . traZODone (DESYREL) 150 MG tablet Take 150 mg by mouth at bedtime.  Marland Kitchen UNABLE TO FIND Med Name: Med Pass twice daily  . [DISCONTINUED] Oxycodone HCl 10 MG TABS    No facility-administered encounter medications on file as of 03/10/2016.      Patient Active Problem List   Diagnosis Date Noted  . Obstructive sleep apnea 02/11/2016  . Hx of mixed drug abuse 01/29/2016  . At risk for adverse drug reaction 01/28/2016  .  Seizure ([place])   . IVDU (intravenous drug user)   . Staphylococcal arthritis of right knee ([place])   . Septic arthritis ([place]) 01/14/2016  . Lumbar spine pain   . Difficulty in walking, not elsewhere classified   . Infection of total knee replacement ([place]) 01/02/2016  . Pleural effusion   . Left upper quadrant pain   . Pain   . 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 Infection 12/29/2015  . Lactic acidosis   . Pyogenic arthritis of right knee joint ([place]) 07/18/2015  . Bacteremia due to Streptococcus 07/18/2015  . Cellulitis of right upper extremity 07/18/2015  . Chronic hepatitis C without  hepatic coma ([place]) 07/18/2015  . Anemia 07/18/2015  . Opacity of lung on imaging study, bilateral 07/18/2015  . Anxiety state 07/18/2015  . History of total knee arthroplasty 07/16/2015  . Severe sepsis ([place]) 07/14/2015  . Moderate episode of recurrent major depressive disorder ([place])   . Chronic pain syndrome 05/21/2014  . Essential hypertension 08/28/2013  . GERD (gastroesophageal reflux disease) 08/28/2013  . Osteoarthritis of left knee 08/23/2013  . Total knee replacement status 08/23/2013  . Alcohol dependence ([place]) 07/14/2013  . Alcohol dependency ([place]) 07/07/2013  . Major depression 07/07/2013  . Varicose veins of lower extremities with other complications [id number]  . Recurrent falls 04/14/2011  . Hypotension 04/13/2011  . ARF (acute renal failure) ([place]) 04/13/2011  . Hypothyroidism 04/13/2011  . Prolapsed and thrombosed 09/03/2010     Health Maintenance Due  Topic Date Due  . COLONOSCOPY  06/21/2002  . ZOSTAVAX  06/20/2012     Review of Systems  Physical Exam   BP 118/67   Pulse 96   Temp 98.4 F (36.9 C) (Oral)   Ht 5' 11.5" (1.816 m)   Wt 185 lb 14.4 oz (84.3 kg)   BMI 25.57 kg/m    Physical Exam  Constitutional: He is oriented to person, place, and time. He appears well-developed and well-nourished. No distress.  HENT:  Mouth/Throat: Oropharynx is clear and moist. No oropharyngeal exudate.  Cardiovascular: Normal rate, regular rhythm and normal heart sounds. Exam reveals no gallop and no friction rub.  No murmur heard.  Pulmonary/Chest: Effort normal and breath sounds normal. No respiratory distress. He has no wheezes.  Ext: knee is warm with reaccumulated effusion Skin: Skin is warm and dry. No rash noted. No erythema. Knee incision is well healed   Lab Results  Component Value Date   HEPBSAB NEG 09/03/2015   Lab Results  Component Value Date   LABRPR NON REACTIVE 04/14/2011    CBC Lab Results  Component Value Date   WBC 9.9 01/24/2016    RBC 3.16 (L) 01/24/2016   HGB 10.1 (L) 01/24/2016   HCT 30.5 (L) 01/24/2016   PLT 290 01/24/2016   MCV 96.5 01/24/2016   MCH 32.0 01/24/2016   MCHC 33.1 01/24/2016   RDW 14.3 01/24/2016   LYMPHSABS 1.8 01/14/2016   MONOABS 1.2 (H) 01/14/2016   EOSABS 0.3 01/14/2016    BMET Lab Results  Component Value Date   NA 136 01/24/2016   K 4.6 01/24/2016   CL 102 01/24/2016   CO2 28 01/24/2016   GLUCOSE 105 (H) 01/24/2016   BUN 10 01/24/2016   CREATININE 1.33 (H) 01/24/2016   CALCIUM 9.4 01/24/2016   GFRNONAA 55 (L) 01/24/2016   GFRAA >60 01/24/2016    Lab Results  Component Value Date   ESRSEDRATE 60 (H) 03/10/2016   Lab Results  Component Value Date   CRP 126.6 (H) 03/10/2016     Assessment and Plan  - will check sed rate and crp  - arrange to have him be seen at dr olin/geoffre for repeat aspiration to see if concern for recurrent infection  Addendum: due to elevated markers, coordinated with orthopedics for plan to restart linezolid 600mg  BID for addn 4 wk.  Chronic hep C = will check viral load to ensure he has been succesfully treated  Chronic hep B = will check viral load  Right inguinal hernia = can easily reduce. Refer to general surgery for eval

## 2016-03-11 LAB — SEDIMENTATION RATE: Sed Rate: 60 mm/hr — ABNORMAL HIGH (ref 0–20)

## 2016-03-11 LAB — C-REACTIVE PROTEIN: CRP: 126.6 mg/L — ABNORMAL HIGH (ref <8.0)

## 2016-03-13 LAB — HEPATITIS B DNA, ULTRAQUANTITATIVE, PCR: Hepatitis B DNA: <20 IU/mL — ABNORMAL HIGH (ref <20)

## 2016-03-13 LAB — HEPATITIS C RNA QUANTITATIVE
HCV QUANT LOG: NOT DETECTED {Log_IU}/mL (ref <1.18)
HCV Quantitative: <15 IU/mL (ref <15)

## 2016-03-20 ENCOUNTER — Telehealth: Payer: Self-pay | Admitting: *Deleted

## 2016-03-20 NOTE — Telephone Encounter (Signed)
Patient called to let Dr Baxter Flattery know that Dr Alvan Dame restarted the antibiotics 1/19 after aspiration, linezolid 600mg  BID.  RN called Dr Aurea Graff office for copies of the labs, cultures. Please advise. Landis Gandy, RN

## 2016-03-20 NOTE — Telephone Encounter (Signed)
Can we have him come in to get labs done at end of next week. cbc

## 2016-03-23 ENCOUNTER — Other Ambulatory Visit: Payer: Self-pay | Admitting: *Deleted

## 2016-03-23 DIAGNOSIS — M1712 Unilateral primary osteoarthritis, left knee: Secondary | ICD-10-CM

## 2016-03-23 NOTE — Telephone Encounter (Signed)
CBC only?  He will come Thursday 2/1 at 2:15.

## 2016-03-24 ENCOUNTER — Telehealth: Payer: Self-pay | Admitting: Pharmacist Clinician (PhC)/ Clinical Pharmacy Specialist

## 2016-03-24 NOTE — Telephone Encounter (Signed)
We got a fax from Dr. Aurea Graff office today for Mike's synovial culture that was done on 1/19. It has grown out MSSA again. His knee was very swollen on 1/16 at the visit. Ortho prescribed him 14 days of linezolid. He restarted the Zyvox on 03/18/16.  He is schedule for a washout on 2/12. He will cont abx until the procedure then a length of therapy will be determine at that point.

## 2016-03-25 NOTE — Telephone Encounter (Signed)
Only cbc 

## 2016-03-26 ENCOUNTER — Other Ambulatory Visit: Payer: 59

## 2016-03-26 DIAGNOSIS — M1712 Unilateral primary osteoarthritis, left knee: Secondary | ICD-10-CM

## 2016-03-26 LAB — CBC WITH DIFFERENTIAL/PLATELET
Basophils Absolute: 90 cells/uL (ref 0–200)
Basophils Relative: 1 %
EOS ABS: 360 {cells}/uL (ref 15–500)
EOS PCT: 4 %
HEMATOCRIT: 29.8 % — AB (ref 38.5–50.0)
HEMOGLOBIN: 9.5 g/dL — AB (ref 13.2–17.1)
LYMPHS ABS: 2250 {cells}/uL (ref 850–3900)
Lymphocytes Relative: 25 %
MCH: 29.5 pg (ref 27.0–33.0)
MCHC: 31.9 g/dL — AB (ref 32.0–36.0)
MCV: 92.5 fL (ref 80.0–100.0)
MPV: 8.7 fL (ref 7.5–12.5)
Monocytes Absolute: 630 cells/uL (ref 200–950)
Monocytes Relative: 7 %
NEUTROS ABS: 5670 {cells}/uL (ref 1500–7800)
NEUTROS PCT: 63 %
Platelets: 495 10*3/uL — ABNORMAL HIGH (ref 140–400)
RBC: 3.22 MIL/uL — ABNORMAL LOW (ref 4.20–5.80)
RDW: 15.3 % — ABNORMAL HIGH (ref 11.0–15.0)
WBC: 9 10*3/uL (ref 3.8–10.8)

## 2016-03-30 ENCOUNTER — Other Ambulatory Visit (HOSPITAL_COMMUNITY): Payer: Self-pay | Admitting: *Deleted

## 2016-03-30 NOTE — Patient Instructions (Addendum)
Juan Wiggins  03/30/2016   Your procedure is scheduled on: 04-06-16  Report to [facility] Main  Entrance take [facility]  elevators to 3rd floor to  [place] at 1000 AM  .  Call this number if you have problems the morning of surgery [phone]   Remember: ONLY 1 PERSON MAY GO WITH YOU TO SHORT STAY TO GET  READY MORNING OF [name].  Do not eat food or drink liquids :After Midnight.     Take these medicines the morning of surgery with A SIP OF WATER: ALBUTEROL INHALER IF NEEDED AND BRING INHALER WITH YOU, ESCITALOPRAM (LEXAPRO), LEVOTHYROXINE (SYNTHROID), OXYCODONE HCL IF NEEDED, RAPAFLO, TENOFOVIR (VIREAD), LINEZOID              You may not have any metal on your body including hair pins and              piercings  Do not wear jewelry, make-up, lotions, powders or perfumes, deodorant             Do not wear nail polish.  Do not shave  48 hours prior to surgery.              Men may shave face and neck.   Do not bring valuables to the hospital. [name].  Contacts, dentures or bridgework may not be worn into surgery.  Leave suitcase in the car. After surgery it may be brought to your room.                   Please read over the following fact sheets you were given: _____________________________________________________________________             [facility] - Preparing for Surgery Before surgery, you can play an important role.  Because skin is not sterile, your skin needs to be as free of germs as possible.  You can reduce the number of germs on your skin by washing with CHG (chlorahexidine gluconate) soap before surgery.  CHG is an antiseptic cleaner which kills germs and bonds with the skin to continue killing germs even after washing. Please DO NOT use if you have an allergy to CHG or antibacterial soaps.  If your skin becomes reddened/irritated stop using the CHG and inform your nurse when you  arrive at Short Stay. Do not shave (including legs and underarms) for at least 48 hours prior to the first CHG shower.  You may shave your face/neck. Please follow these instructions carefully:  1.  Shower with CHG Soap the night before surgery and the  morning of Surgery.  2.  If you choose to wash your hair, wash your hair first as usual with your  normal  shampoo.  3.  After you shampoo, rinse your hair and body thoroughly to remove the  shampoo.                           4.  Use CHG as you would any other liquid soap.  You can apply chg directly  to the skin and wash                       Gently with a scrungie or clean washcloth.  5.  Apply the CHG Soap to your body ONLY FROM THE NECK DOWN.   Do not use on face/ open                           Wound or open sores. Avoid contact with eyes, ears mouth and genitals (private parts).                       Wash face,  Genitals (private parts) with your normal soap.             6.  Wash thoroughly, paying special attention to the area where your surgery  will be performed.  7.  Thoroughly rinse your body with warm water from the neck down.  8.  DO NOT shower/wash with your normal soap after using and rinsing off  the CHG Soap.                9.  Pat yourself dry with a clean towel.            10.  Wear clean pajamas.            11.  Place clean sheets on your bed the night of your first shower and do not  sleep with pets. Day of Surgery : Do not apply any lotions/deodorants the morning of surgery.  Please wear clean clothes to the hospital/surgery center.  FAILURE TO FOLLOW THESE INSTRUCTIONS MAY RESULT IN THE CANCELLATION OF YOUR SURGERY PATIENT SIGNATURE_________________________________  NURSE SIGNATURE__________________________________  ________________________________________________________________________   Juan Wiggins  An incentive spirometer is a tool that can help keep your lungs clear and active. This tool measures how  well you are filling your lungs with each breath. Taking long deep breaths may help reverse or decrease the chance of developing breathing (pulmonary) problems (especially infection) following:  A long period of time when you are unable to move or be active. BEFORE THE PROCEDURE   If the spirometer includes an indicator to show your best effort, your nurse or respiratory therapist will set it to a desired goal.  If possible, sit up straight or lean slightly forward. Try not to slouch.  Hold the incentive spirometer in an upright position. INSTRUCTIONS FOR USE  1. Sit on the edge of your bed if possible, or sit up as far as you can in bed or on a chair. 2. Hold the incentive spirometer in an upright position. 3. Breathe out normally. 4. Place the mouthpiece in your mouth and seal your lips tightly around it. 5. Breathe in slowly and as deeply as possible, raising the piston or the ball toward the top of the column. 6. Hold your breath for 3-5 seconds or for as long as possible. Allow the piston or ball to fall to the bottom of the column. 7. Remove the mouthpiece from your mouth and breathe out normally. 8. Rest for a few seconds and repeat Steps 1 through 7 at least 10 times every 1-2 hours when you are awake. Take your time and take a few normal breaths between deep breaths. 9. The spirometer may include an indicator to show your best effort. Use the indicator as a goal to work toward during each repetition. 10. After each set of 10 deep breaths, practice coughing to be sure your lungs are clear. If you have an incision (the cut made at the time of surgery), support your incision when coughing by placing a pillow or  rolled up towels firmly against it. Once you are able to get out of bed, walk around indoors and cough well. You may stop using the incentive spirometer when instructed by your caregiver.  RISKS AND COMPLICATIONS  Take your time so you do not get dizzy or light-headed.  If you  are in pain, you may need to take or ask for pain medication before doing incentive spirometry. It is harder to take a deep breath if you are having pain. AFTER USE  Rest and breathe slowly and easily.  It can be helpful to keep track of a log of your progress. Your caregiver can provide you with a simple table to help with this. If you are using the spirometer at home, follow these instructions: [place] IF:   You are having difficultly using the spirometer.  You have trouble using the spirometer as often as instructed.  Your pain medication is not giving enough relief while using the spirometer.  You develop fever of 100.5 F (38.1 C) or higher. SEEK IMMEDIATE MEDICAL CARE IF:   You cough up bloody sputum that had not been present before.  You develop fever of 102 F (38.9 C) or greater.  You develop worsening pain at or near the incision site. MAKE SURE YOU:   Understand these instructions.  Will watch your condition.  Will get help right away if you are not doing well or get worse. Document Released: 06/22/2006 Document Revised: 05/04/2011 Document Reviewed: 08/23/2006 ExitCare Patient Information 2014 ExitCare, [place].   ________________________________________________________________________  WHAT IS A BLOOD TRANSFUSION? Blood Transfusion Information  A transfusion is the replacement of blood or some of its parts. Blood is made up of multiple cells which provide different functions.  Red blood cells carry oxygen and are used for blood loss replacement.  White blood cells fight against infection.  Platelets control bleeding.  Plasma helps clot blood.  Other blood products are available for specialized needs, such as hemophilia or other clotting disorders. BEFORE THE TRANSFUSION  Who gives blood for transfusions?   Healthy volunteers who are fully evaluated to make sure their blood is safe. This is blood bank blood. Transfusion therapy is the safest  it has ever been in the practice of medicine. Before blood is taken from a donor, a complete history is taken to make sure that person has no history of diseases nor engages in risky social behavior (examples are intravenous drug use or sexual activity with multiple partners). The donor's travel history is screened to minimize risk of transmitting infections, such as malaria. The donated blood is tested for signs of infectious diseases, such as HIV and hepatitis. The blood is then tested to be sure it is compatible with you in order to minimize the chance of a transfusion reaction. If you or a relative donates blood, this is often done in anticipation of surgery and is not appropriate for emergency situations. It takes many days to process the donated blood. RISKS AND COMPLICATIONS Although transfusion therapy is very safe and saves many lives, the main dangers of transfusion include:   Getting an infectious disease.  Developing a transfusion reaction. This is an allergic reaction to something in the blood you were given. Every precaution is taken to prevent this. The decision to have a blood transfusion has been considered carefully by your caregiver before blood is given. Blood is not given unless the benefits outweigh the risks. AFTER THE TRANSFUSION  Right after receiving a blood transfusion, you will usually  feel much better and more energetic. This is especially true if your red blood cells have gotten low (anemic). The transfusion raises the level of the red blood cells which carry oxygen, and this usually causes an energy increase.  The nurse administering the transfusion will monitor you carefully for complications. HOME CARE INSTRUCTIONS  No special instructions are needed after a transfusion. You may find your energy is better. Speak with your caregiver about any limitations on activity for underlying diseases you may have. SEEK MEDICAL CARE IF:   Your condition is not improving after  your transfusion.  You develop redness or irritation at the intravenous (IV) site. SEEK IMMEDIATE MEDICAL CARE IF:  Any of the following symptoms occur over the next 12 hours:  Shaking chills.  You have a temperature by mouth above 102 F (38.9 C), not controlled by medicine.  Chest, back, or muscle pain.  People around you feel you are not acting correctly or are confused.  Shortness of breath or difficulty breathing.  Dizziness and fainting.  You get a rash or develop hives.  You have a decrease in urine output.  Your urine turns a dark color or changes to pink, red, or brown. Any of the following symptoms occur over the next 10 days:  You have a temperature by mouth above 102 F (38.9 C), not controlled by medicine.  Shortness of breath.  Weakness after normal activity.  The white part of the eye turns yellow (jaundice).  You have a decrease in the amount of urine or are urinating less often.  Your urine turns a dark color or changes to pink, red, or brown. Document Released: 02/07/2000 Document Revised: 05/04/2011 Document Reviewed: 09/26/2007 [facility] Patient Information 2014 [place], [place].  _______________________________________________________________________

## 2016-03-30 NOTE — Progress Notes (Signed)
EEG 01-20-16 EPIC ECHO 07-16-15 EPIC EKG 12-29-15 EPIC

## 2016-03-31 ENCOUNTER — Ambulatory Visit (HOSPITAL_COMMUNITY)
Admission: RE | Admit: 2016-03-31 | Discharge: 2016-03-31 | Disposition: A | Discharge status: Home / Self Care | Payer: 59 | Source: Ambulatory Visit | Attending: Anesthesiology | Admitting: Anesthesiology

## 2016-03-31 ENCOUNTER — Encounter (HOSPITAL_COMMUNITY): Payer: Self-pay

## 2016-03-31 ENCOUNTER — Encounter (HOSPITAL_COMMUNITY)
Admission: RE | Admit: 2016-03-31 | Discharge: 2016-03-31 | Disposition: A | Discharge status: Home / Self Care | Payer: 59 | Source: Ambulatory Visit | Attending: Orthopedic Surgery | Admitting: Orthopedic Surgery

## 2016-03-31 DIAGNOSIS — Z01818 Encounter for other preprocedural examination: Secondary | ICD-10-CM | POA: Insufficient documentation

## 2016-03-31 DIAGNOSIS — Z0183 Encounter for blood typing: Secondary | ICD-10-CM | POA: Insufficient documentation

## 2016-03-31 DIAGNOSIS — R9389 Abnormal findings on diagnostic imaging of other specified body structures: Secondary | ICD-10-CM

## 2016-03-31 DIAGNOSIS — R918 Other nonspecific abnormal finding of lung field: Secondary | ICD-10-CM | POA: Insufficient documentation

## 2016-03-31 DIAGNOSIS — Z01812 Encounter for preprocedural laboratory examination: Secondary | ICD-10-CM | POA: Insufficient documentation

## 2016-03-31 HISTORY — DX: Acute kidney failure, unspecified: N17.9

## 2016-03-31 HISTORY — DX: Unilateral inguinal hernia, without obstruction or gangrene, not specified as recurrent: K40.90

## 2016-03-31 HISTORY — DX: Unspecified convulsions: R56.9

## 2016-03-31 HISTORY — DX: Family history of other specified conditions: Z84.89

## 2016-03-31 HISTORY — DX: Pneumonia, unspecified organism: J18.9

## 2016-03-31 LAB — SURGICAL PCR SCREEN
MRSA, PCR: NEGATIVE
Staphylococcus aureus: NEGATIVE

## 2016-03-31 LAB — COMPREHENSIVE METABOLIC PANEL
ALK PHOS: 102 U/L (ref 38–126)
ALT: 13 U/L — ABNORMAL LOW (ref 17–63)
AST: 20 U/L (ref 15–41)
Albumin: 3.4 g/dL — ABNORMAL LOW (ref 3.5–5.0)
Anion gap: 6 (ref 5–15)
BUN: 24 mg/dL — AB (ref 6–20)
CALCIUM: 9.4 mg/dL (ref 8.9–10.3)
CHLORIDE: 104 mmol/L (ref 101–111)
CO2: 24 mmol/L (ref 22–32)
CREATININE: 2.03 mg/dL — AB (ref 0.61–1.24)
GFR calc Af Amer: 38 mL/min — ABNORMAL LOW (ref >60)
GFR calc non Af Amer: 33 mL/min — ABNORMAL LOW (ref >60)
GLUCOSE: 126 mg/dL — AB (ref 65–99)
Potassium: 4.9 mmol/L (ref 3.5–5.1)
SODIUM: 134 mmol/L — AB (ref 135–145)
Total Bilirubin: 0.3 mg/dL (ref 0.3–1.2)
Total Protein: 7.9 g/dL (ref 6.5–8.1)

## 2016-03-31 LAB — CBC
HCT: 29.4 % — ABNORMAL LOW (ref 39.0–52.0)
HEMOGLOBIN: 9.5 g/dL — AB (ref 13.0–17.0)
MCH: 28.8 pg (ref 26.0–34.0)
MCHC: 32.3 g/dL (ref 30.0–36.0)
MCV: 89.1 fL (ref 78.0–100.0)
Platelets: 360 10*3/uL (ref 150–400)
RBC: 3.3 MIL/uL — ABNORMAL LOW (ref 4.22–5.81)
RDW: 15.2 % (ref 11.5–15.5)
WBC: 9.4 10*3/uL (ref 4.0–10.5)

## 2016-03-31 NOTE — Progress Notes (Signed)
CHEST XRAY ABNORAMAL 01-02-16 WILL REPEAT AT PRE OP TODAY

## 2016-03-31 NOTE — Progress Notes (Signed)
Cbc and cmet results faxed by epic to dr Alvan Dame

## 2016-04-01 ENCOUNTER — Telehealth: Payer: Self-pay | Admitting: Pharmacist Clinician (PhC)/ Clinical Pharmacy Specialist

## 2016-04-01 NOTE — Telephone Encounter (Signed)
Juan Wiggins called to ask if any of the meds could cause him to lose weight. Reviewed his list and nothing really stood out. Also reminded him of the upcoming culture.

## 2016-04-05 NOTE — H&P (Signed)
Juan Wiggins is an 66 y.o. male.    Chief Complaint:   Removal of antibiotic spacer, irrigation and debridement and placement of another antibiotic spacer, right knee.  Procedure:  Persistent infection of right knee  HPI: Pt is a 66 y.o. male complaining of right knee pain / swelling / infection. Patient had a previous removal of TKA, I&D and placement of antibiotic spacer on 01/21/2016.  Since then he has been using the IV antibiotics.   Upon return to the clinic Synovasure was performed which revealed a positive alpha defensin concerning for persistent infection. At this point, he has a scheduled surgery. His surgery that will be per performed will be a repeat I and D as well as placement of a new antibiotic spacer. At this point Dr. Alvan Dame does not feel it safe to proceed with total knee reimplantation   Various options are discussed with the patient. Risks, benefits and expectations were discussed with the patient. Patient understand the risks, benefits and expectations and wishes to proceed with surgery.    PCP: Helane Rima, MD   ID:  Carlyle Basques, MD, [facility].  D/C Plans:       Home   Post-op Meds:       No Rx given  Tranexamic Acid:      To be given - IV   Decadron:      Is to be given  FYI:     ASA  Oxycodone   PMH: Past Medical History:  Diagnosis Date  . Acute renal failure (ARF) ([place]) 2017   TOOK HEMODIALYSIS X 5 WEEKS   . Anxiety   . Arthritis    RHEUMATOID ARTHRITIS; OA LEFT KNEE  . Cancer (HCC)    MELANOMA REMOVED RT SHOULDER  . Depression   . Family history of adverse reaction to anesthesia    MOTHER AND SISTER SLOW TO WAKE UP  . GERD (gastroesophageal reflux disease)    PREVACID IF NEEDED  . Heart murmur   . Hemorrhoids   . Hepatitis A    PT STATES TYPE OF HEPATITIS YOU GET FROM SHELLFISH  . Hepatitis B   . Hepatitis C   . Hypertension    OFF ALL MEDS LAST 2 MONTHS   . Hypothyroidism   . Inguinal hernia    RIGHT  .  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 Pancreatitis, alcoholic, acute [id number]  . Pneumonia 01/02/2016  . Seizures ([place]) 2017   ONCE WHILE IN HOSPITAL NONE SINCE  . Septic arthritis ([place]) 01/14/2016   RT KNEE  . Sleep apnea    CLAUSTROPHOBIC - COULD NOT TOLERATE CPAP MASK  . Tachycardia    PT STATES HIS HEART RATE USUALLY 100 OR MORE    PSH: Past Surgical History:  Procedure Laterality Date  . colonscopy     . ELBOW SURGERY     BILATERAL ELBOW SURGERY  . EXCISIONAL TOTAL KNEE ARTHROPLASTY WITH ANTIBIOTIC SPACERS Right 01/21/2016   Procedure: REMOVAL OF KNEE PROSTESIS WITH RIGHT KNEE PLACEMENT OF ANTIBIOTIC SPACER;  Surgeon: Latanya Maudlin, MD;  Location: [place];  Service: Orthopedics;  Laterality: Right;  . I&D EXTREMITY Right 01/02/2016   Procedure: IRRIGATION AND DEBRIDEMENT RIGHT TOTAL KNEE;  Surgeon: Latanya Maudlin, MD;  Location: [place];  Service: Orthopedics;  Laterality: Right;  . I&D KNEE WITH POLY EXCHANGE Right 07/16/2015   Procedure: IRRIGATION AND DEBRIDEMENT RIGHT KNEE WITH POLY EXCHANGE AND PLACEMENT  OF ANTIBIOTIC BEADS;  Surgeon: Latanya Maudlin, MD;  Location: [place];  Service: Orthopedics;  Laterality: Right;  . KNEE ARTHROSCOPY Right 07/14/2015   Procedure: ARTHROSCOPY IRRIGATION AND DEBRIDEMENT - KNEE;  Surgeon: Justice Britain, MD;  Location: [place];  Service: Orthopedics;  Laterality: Right;  . KNEE ARTHROSCOPY Right 12/29/2015   Procedure: ARTHROSCOPIC IRRIGATION AND DEBRIEDMENT RIGHT  KNEE;  Surgeon: Melina Schools, MD;  Location: [place];  Service: Orthopedics;  Laterality: Right;  . KNEE ARTHROSCOPY Right 01/15/2016   Procedure: ARTHROSCOPIC WASHOUT;  Surgeon: Latanya Maudlin, MD;  Location: [place];  Service: Orthopedics;  Laterality: Right;  . KNEE SURGERY     BILATERAL KNEE ARTHROSCOPY  . left TKR     July 2015  . LUMBAR LAMINECTOMY/DECOMPRESSION MICRODISCECTOMY Left 06/06/2014   Procedure: LEFT L4-5 DECOMPRESSION ;  Surgeon: Melina Schools, MD;  Location: [place];  Service: Orthopedics;  Laterality: Left;  . REFRACTIVE SURGERY Bilateral   . SHOULDER SURGERY     RIGHT ROTATOR CUFF REPAIR AND LEFT ARTHROSCOPY  . TOTAL KNEE ARTHROPLASTY Left 08/23/2013   Procedure: LEFT TOTAL KNEE ARTHROPLASTY;  Surgeon: Tobi Bastos, MD;  Location: [facility];  Service: Orthopedics;  Laterality: Left;  . TOTAL KNEE ARTHROPLASTY Right 02/07/2014   Procedure: RIGHT TOTAL KNEE ARTHROPLASTY;  Surgeon: Tobi Bastos, MD;  Location: [facility];  Service: Orthopedics;  Laterality: Right;    Social History:  reports that he quit smoking about 18 years ago. His smoking use included Cigars. He has a 52.50 pack-year smoking history. He has never used smokeless tobacco. He reports that he uses drugs, including Cocaine. He reports that he does not drink alcohol.  Allergies:  Allergies  Allergen Reactions  . Cefazolin Other (See Comments)    Patient had a seizure while on cefazolin and no other core cause could be identified other than the beta lactam  . Heparin Other (See Comments)    Low platelets (130s), no HIT testing performed, platelets recovered.  . Wellbutrin [Bupropion] Other (See Comments)    Hallucinations-started on 150mg  daily dosage    Medications: No current facility-administered medications for this encounter.    Current Outpatient Prescriptions  Medication Sig Dispense Refill  . albuterol (PROVENTIL HFA;VENTOLIN HFA) 108 (90 Base) MCG/ACT inhaler Inhale 2-4 puffs into the lungs daily as needed for shortness of breath.     . Cholecalciferol 2000 units CAPS Take 6,000 Units by mouth daily.     Marland Kitchen escitalopram (LEXAPRO) 20 MG tablet Take 20 mg by mouth daily.     Marland Kitchen levothyroxine (SYNTHROID, LEVOTHROID) 200 MCG tablet Take 225 mcg by mouth daily before breakfast. Pt takes with 25 mcg    . levothyroxine (SYNTHROID, LEVOTHROID) 25 MCG tablet Take 225 mcg by mouth daily. Pt takes with 200 mcg  0  . linezolid (ZYVOX) 600 MG tablet Take 600  mg by mouth 2 (two) times daily.  0  . OLANZapine-FLUoxetine (SYMBYAX) 12-50 MG capsule Take 1 capsule by mouth at bedtime.    . Oxycodone HCl 10 MG TABS Take 10 mg by mouth every 6 (six) hours as needed.  0  . promethazine (PHENERGAN) 25 MG tablet Take 25 mg by mouth every 6 (six) hours as needed for nausea or vomiting.    Marland Kitchen RAPAFLO 8 MG CAPS capsule Take 8 mg by mouth daily with breakfast.     . tenofovir (VIREAD) 300 MG tablet Take 1 tablet (300 mg total) by mouth daily. 30 tablet 6  . valACYclovir (VALTREX) 1000  MG tablet Take 1,000 mg by mouth See admin instructions. Take 1 tablet by mouth daily for flares - until one day past outbreak  2  . zolpidem (AMBIEN) 10 MG tablet Take 1/2 to 1 tablet by mouth as needed at bedtime 30 tablet 0     Review of Systems  Constitutional: Negative.   HENT: Negative.   Eyes: Negative.   Respiratory: Negative.   Cardiovascular: Negative.   Gastrointestinal: Positive for heartburn.  Genitourinary: Negative.   Musculoskeletal: Positive for joint pain.  Skin: Negative.   Neurological: Negative.   Endo/Heme/Allergies: Negative.   Psychiatric/Behavioral: Positive for depression. The patient is nervous/anxious.        Physical Exam  Constitutional: He is oriented to person, place, and time. He appears well-developed.  HENT:  Head: Normocephalic.  Eyes: Pupils are equal, round, and reactive to light.  Neck: Neck supple. No JVD present. No tracheal deviation present. No thyromegaly present.  Cardiovascular: Normal rate, regular rhythm and intact distal pulses.   Murmur heard. Respiratory: Effort normal and breath sounds normal. No respiratory distress. He has no wheezes.  GI: Soft. There is no tenderness. There is no guarding.  Musculoskeletal:       Right knee: He exhibits decreased range of motion, swelling, laceration (healed previous incision), abnormal alignment and bony tenderness. He exhibits no ecchymosis, no deformity and no erythema.  Tenderness found.  Lymphadenopathy:    He has no cervical adenopathy.  Neurological: He is alert and oriented to person, place, and time.  Skin: Skin is warm and dry.  Psychiatric: He has a normal mood and affect.       Assessment/Plan Assessment:    Persistent infection of right knee  Plan: Patient will undergo a removal of antibiotic spacer, irrigation and debridement and placement of another antibiotic spacer on 04/06/2016 per Dr. Alvan Dame at [facility]. Risks benefits and expectations were discussed with the patient. Patient understand risks, benefits and expectations and wishes to proceed.     [place] Davianna Deutschman   PA-C  04/05/2016, 1:37 PM

## 2016-04-06 ENCOUNTER — Ambulatory Visit (HOSPITAL_COMMUNITY): Payer: Self-pay | Admitting: Psychiatry

## 2016-04-06 ENCOUNTER — Other Ambulatory Visit: Payer: Self-pay | Admitting: Infectious Diseases

## 2016-04-06 ENCOUNTER — Inpatient Hospital Stay (HOSPITAL_COMMUNITY): Payer: 59 | Admitting: Anesthesiology

## 2016-04-06 ENCOUNTER — Encounter (HOSPITAL_COMMUNITY): Payer: Self-pay

## 2016-04-06 ENCOUNTER — Inpatient Hospital Stay (HOSPITAL_COMMUNITY)
Admission: RE | Admit: 2016-04-06 | Discharge: 2016-04-10 | DRG: 493 | Disposition: A | Discharge status: Home Health | Payer: 59 | Source: Ambulatory Visit | Attending: Orthopedic Surgery | Admitting: Orthopedic Surgery

## 2016-04-06 ENCOUNTER — Encounter (HOSPITAL_COMMUNITY)
Admission: RE | Disposition: A | Discharge status: Home Health | Payer: Self-pay | Source: Ambulatory Visit | Attending: Orthopedic Surgery

## 2016-04-06 DIAGNOSIS — Y831 Surgical operation with implant of artificial internal device as the cause of abnormal reaction of the patient, or of later complication, without mention of misadventure at the time of the procedure: Secondary | ICD-10-CM | POA: Diagnosis present

## 2016-04-06 DIAGNOSIS — B9561 Methicillin susceptible Staphylococcus aureus infection as the cause of diseases classified elsewhere: Secondary | ICD-10-CM | POA: Diagnosis not present

## 2016-04-06 DIAGNOSIS — R634 Abnormal weight loss: Secondary | ICD-10-CM | POA: Diagnosis present

## 2016-04-06 DIAGNOSIS — T8453XA Infection and inflammatory reaction due to internal right knee prosthesis, initial encounter: Principal | ICD-10-CM

## 2016-04-06 DIAGNOSIS — B159 Hepatitis A without hepatic coma: Secondary | ICD-10-CM | POA: Diagnosis present

## 2016-04-06 DIAGNOSIS — M00861 Arthritis due to other bacteria, right knee: Secondary | ICD-10-CM | POA: Diagnosis not present

## 2016-04-06 DIAGNOSIS — Z888 Allergy status to other drugs, medicaments and biological substances status: Secondary | ICD-10-CM | POA: Diagnosis not present

## 2016-04-06 DIAGNOSIS — B182 Chronic viral hepatitis C: Secondary | ICD-10-CM | POA: Diagnosis present

## 2016-04-06 DIAGNOSIS — Z85820 Personal history of malignant melanoma of skin: Secondary | ICD-10-CM | POA: Diagnosis not present

## 2016-04-06 DIAGNOSIS — D649 Anemia, unspecified: Secondary | ICD-10-CM | POA: Diagnosis present

## 2016-04-06 DIAGNOSIS — M069 Rheumatoid arthritis, unspecified: Secondary | ICD-10-CM

## 2016-04-06 DIAGNOSIS — R569 Unspecified convulsions: Secondary | ICD-10-CM | POA: Diagnosis present

## 2016-04-06 DIAGNOSIS — G4730 Sleep apnea, unspecified: Secondary | ICD-10-CM | POA: Diagnosis present

## 2016-04-06 DIAGNOSIS — E039 Hypothyroidism, unspecified: Secondary | ICD-10-CM | POA: Diagnosis present

## 2016-04-06 DIAGNOSIS — D62 Acute posthemorrhagic anemia: Secondary | ICD-10-CM | POA: Diagnosis not present

## 2016-04-06 DIAGNOSIS — R112 Nausea with vomiting, unspecified: Secondary | ICD-10-CM | POA: Diagnosis not present

## 2016-04-06 DIAGNOSIS — N183 Chronic kidney disease, stage 3 unspecified: Secondary | ICD-10-CM | POA: Diagnosis present

## 2016-04-06 DIAGNOSIS — M00061 Staphylococcal arthritis, right knee: Secondary | ICD-10-CM | POA: Diagnosis present

## 2016-04-06 DIAGNOSIS — F329 Major depressive disorder, single episode, unspecified: Secondary | ICD-10-CM | POA: Diagnosis present

## 2016-04-06 DIAGNOSIS — Y792 Prosthetic and other implants, materials and accessory orthopedic devices associated with adverse incidents: Secondary | ICD-10-CM | POA: Diagnosis not present

## 2016-04-06 DIAGNOSIS — Z87891 Personal history of nicotine dependence: Secondary | ICD-10-CM

## 2016-04-06 DIAGNOSIS — M06862 Other specified rheumatoid arthritis, left knee: Secondary | ICD-10-CM | POA: Diagnosis present

## 2016-04-06 DIAGNOSIS — B9689 Other specified bacterial agents as the cause of diseases classified elsewhere: Secondary | ICD-10-CM | POA: Diagnosis not present

## 2016-04-06 DIAGNOSIS — G894 Chronic pain syndrome: Secondary | ICD-10-CM | POA: Diagnosis present

## 2016-04-06 DIAGNOSIS — Z96659 Presence of unspecified artificial knee joint: Secondary | ICD-10-CM

## 2016-04-06 DIAGNOSIS — T8453XD Infection and inflammatory reaction due to internal right knee prosthesis, subsequent encounter: Secondary | ICD-10-CM

## 2016-04-06 DIAGNOSIS — T8579XA Infection and inflammatory reaction due to other internal prosthetic devices, implants and grafts, initial encounter: Secondary | ICD-10-CM | POA: Diagnosis present

## 2016-04-06 DIAGNOSIS — Z79899 Other long term (current) drug therapy: Secondary | ICD-10-CM

## 2016-04-06 DIAGNOSIS — R42 Dizziness and giddiness: Secondary | ICD-10-CM | POA: Diagnosis not present

## 2016-04-06 DIAGNOSIS — R110 Nausea: Secondary | ICD-10-CM

## 2016-04-06 DIAGNOSIS — I129 Hypertensive chronic kidney disease with stage 1 through stage 4 chronic kidney disease, or unspecified chronic kidney disease: Secondary | ICD-10-CM | POA: Diagnosis present

## 2016-04-06 DIAGNOSIS — Z881 Allergy status to other antibiotic agents status: Secondary | ICD-10-CM

## 2016-04-06 DIAGNOSIS — F419 Anxiety disorder, unspecified: Secondary | ICD-10-CM | POA: Diagnosis present

## 2016-04-06 DIAGNOSIS — Z8249 Family history of ischemic heart disease and other diseases of the circulatory system: Secondary | ICD-10-CM

## 2016-04-06 DIAGNOSIS — K219 Gastro-esophageal reflux disease without esophagitis: Secondary | ICD-10-CM | POA: Diagnosis present

## 2016-04-06 DIAGNOSIS — B181 Chronic viral hepatitis B without delta-agent: Secondary | ICD-10-CM | POA: Diagnosis present

## 2016-04-06 DIAGNOSIS — I10 Essential (primary) hypertension: Secondary | ICD-10-CM | POA: Diagnosis present

## 2016-04-06 DIAGNOSIS — Z89529 Acquired absence of unspecified knee: Secondary | ICD-10-CM | POA: Diagnosis present

## 2016-04-06 DIAGNOSIS — Z96653 Presence of artificial knee joint, bilateral: Secondary | ICD-10-CM | POA: Diagnosis present

## 2016-04-06 DIAGNOSIS — Z7982 Long term (current) use of aspirin: Secondary | ICD-10-CM

## 2016-04-06 DIAGNOSIS — Z79891 Long term (current) use of opiate analgesic: Secondary | ICD-10-CM

## 2016-04-06 DIAGNOSIS — F1911 Other psychoactive substance abuse, in remission: Secondary | ICD-10-CM

## 2016-04-06 HISTORY — PX: EXCISIONAL TOTAL KNEE ARTHROPLASTY WITH ANTIBIOTIC SPACERS: SHX5827

## 2016-04-06 SURGERY — REMOVAL, TOTAL ARTHROPLASTY HARDWARE, KNEE, WITH ANTIBIOTIC SPACER INSERTION
Anesthesia: General | Site: Knee | Laterality: Right

## 2016-04-06 MED ORDER — HYDROMORPHONE HCL 1 MG/ML IJ SOLN
0.5000 mg | INTRAMUSCULAR | Status: AC | PRN
  Administered 2016-04-06 (×4): 0.5 mg via INTRAVENOUS

## 2016-04-06 MED ORDER — HYDROMORPHONE HCL 1 MG/ML IJ SOLN
INTRAMUSCULAR | Status: DC | PRN
  Administered 2016-04-06 (×4): 0.5 mg via INTRAVENOUS

## 2016-04-06 MED ORDER — MIDAZOLAM HCL 2 MG/2ML IJ SOLN
INTRAMUSCULAR | Status: AC
  Filled 2016-04-06: qty 2

## 2016-04-06 MED ORDER — FENTANYL CITRATE (PF) 100 MCG/2ML IJ SOLN
INTRAMUSCULAR | Status: AC
  Filled 2016-04-06: qty 2

## 2016-04-06 MED ORDER — VALACYCLOVIR HCL 500 MG PO TABS
1000.0000 mg | ORAL_TABLET | Freq: Every day | ORAL | Status: DC
  Administered 2016-04-07 – 2016-04-10 (×4): 1000 mg via ORAL
  Filled 2016-04-06 (×4): qty 2

## 2016-04-06 MED ORDER — ACETAMINOPHEN 500 MG PO TABS
1000.0000 mg | ORAL_TABLET | Freq: Three times a day (TID) | ORAL | Status: DC
  Administered 2016-04-06 – 2016-04-10 (×11): 1000 mg via ORAL
  Filled 2016-04-06 (×11): qty 2

## 2016-04-06 MED ORDER — EPHEDRINE SULFATE-NACL 50-0.9 MG/10ML-% IV SOSY
PREFILLED_SYRINGE | INTRAVENOUS | Status: DC | PRN
  Administered 2016-04-06 (×2): 5 mg via INTRAVENOUS

## 2016-04-06 MED ORDER — FERROUS SULFATE 325 (65 FE) MG PO TABS
325.0000 mg | ORAL_TABLET | Freq: Three times a day (TID) | ORAL | Status: DC
  Administered 2016-04-06 – 2016-04-10 (×7): 325 mg via ORAL
  Filled 2016-04-06 (×9): qty 1

## 2016-04-06 MED ORDER — ASPIRIN 81 MG PO CHEW
81.0000 mg | CHEWABLE_TABLET | Freq: Two times a day (BID) | ORAL | Status: DC
  Administered 2016-04-06 – 2016-04-10 (×8): 81 mg via ORAL
  Filled 2016-04-06 (×8): qty 1

## 2016-04-06 MED ORDER — TENOFOVIR DISOPROXIL FUMARATE 300 MG PO TABS
300.0000 mg | ORAL_TABLET | ORAL | Status: DC
  Administered 2016-04-08 – 2016-04-10 (×2): 300 mg via ORAL
  Filled 2016-04-06 (×3): qty 1

## 2016-04-06 MED ORDER — SODIUM CHLORIDE 0.9 % IV SOLN
INTRAVENOUS | Status: DC
  Administered 2016-04-06: 16:00:00 via INTRAVENOUS

## 2016-04-06 MED ORDER — ALBUTEROL SULFATE (2.5 MG/3ML) 0.083% IN NEBU: 3.0000 mL | INHALATION_SOLUTION | Freq: Every day | RESPIRATORY_TRACT | Status: DC | PRN

## 2016-04-06 MED ORDER — VANCOMYCIN HCL IN DEXTROSE 1-5 GM/200ML-% IV SOLN
INTRAVENOUS | Status: AC
  Filled 2016-04-06: qty 200

## 2016-04-06 MED ORDER — TAMSULOSIN HCL 0.4 MG PO CAPS
0.4000 mg | ORAL_CAPSULE | Freq: Every day | ORAL | Status: DC
  Administered 2016-04-07 – 2016-04-10 (×4): 0.4 mg via ORAL
  Filled 2016-04-06 (×4): qty 1

## 2016-04-06 MED ORDER — ONDANSETRON HCL 4 MG PO TABS
4.0000 mg | ORAL_TABLET | Freq: Four times a day (QID) | ORAL | Status: DC | PRN
  Administered 2016-04-08: 4 mg via ORAL
  Filled 2016-04-06: qty 1

## 2016-04-06 MED ORDER — SUGAMMADEX SODIUM 200 MG/2ML IV SOLN
INTRAVENOUS | Status: AC
  Filled 2016-04-06: qty 2

## 2016-04-06 MED ORDER — EPHEDRINE 5 MG/ML INJ
INTRAVENOUS | Status: AC
  Filled 2016-04-06: qty 10

## 2016-04-06 MED ORDER — ONDANSETRON HCL 4 MG/2ML IJ SOLN
4.0000 mg | Freq: Four times a day (QID) | INTRAMUSCULAR | Status: DC | PRN
  Administered 2016-04-07 (×2): 4 mg via INTRAVENOUS
  Filled 2016-04-06 (×2): qty 2

## 2016-04-06 MED ORDER — METOCLOPRAMIDE HCL 5 MG PO TABS
5.0000 mg | ORAL_TABLET | Freq: Three times a day (TID) | ORAL | Status: DC | PRN
  Administered 2016-04-07: 13:00:00 10 mg via ORAL
  Filled 2016-04-06: qty 2

## 2016-04-06 MED ORDER — ESCITALOPRAM OXALATE 20 MG PO TABS
20.0000 mg | ORAL_TABLET | Freq: Every day | ORAL | Status: DC
  Administered 2016-04-07 – 2016-04-10 (×4): 20 mg via ORAL
  Filled 2016-04-06 (×4): qty 1

## 2016-04-06 MED ORDER — TRANEXAMIC ACID 1000 MG/10ML IV SOLN
1000.0000 mg | INTRAVENOUS | Status: AC
  Administered 2016-04-06: 1000 mg via INTRAVENOUS
  Filled 2016-04-06: qty 1100

## 2016-04-06 MED ORDER — TOBRAMYCIN SULFATE 1.2 G IJ SOLR
INTRAMUSCULAR | Status: AC
  Filled 2016-04-06: qty 3.6

## 2016-04-06 MED ORDER — HYDROMORPHONE HCL 1 MG/ML IJ SOLN
INTRAMUSCULAR | Status: AC
  Administered 2016-04-06: 0.25 mg via INTRAVENOUS
  Filled 2016-04-06: qty 1

## 2016-04-06 MED ORDER — DEXAMETHASONE SODIUM PHOSPHATE 10 MG/ML IJ SOLN
10.0000 mg | Freq: Once | INTRAMUSCULAR | Status: AC
  Administered 2016-04-07: 10 mg via INTRAVENOUS
  Filled 2016-04-06: qty 1

## 2016-04-06 MED ORDER — SODIUM CHLORIDE 0.9 % IR SOLN
Status: DC | PRN
  Administered 2016-04-06 (×2): 3000 mL

## 2016-04-06 MED ORDER — POLYETHYLENE GLYCOL 3350 17 G PO PACK
17.0000 g | PACK | Freq: Two times a day (BID) | ORAL | Status: DC
  Administered 2016-04-07 – 2016-04-10 (×6): 17 g via ORAL
  Filled 2016-04-06 (×6): qty 1

## 2016-04-06 MED ORDER — KETOROLAC TROMETHAMINE 30 MG/ML IJ SOLN
INTRAMUSCULAR | Status: AC
  Filled 2016-04-06: qty 1

## 2016-04-06 MED ORDER — VANCOMYCIN HCL 1000 MG IV SOLR
INTRAVENOUS | Status: AC
  Filled 2016-04-06: qty 1000

## 2016-04-06 MED ORDER — MAGNESIUM CITRATE PO SOLN: 1.0000 | Freq: Once | ORAL | Status: DC | PRN

## 2016-04-06 MED ORDER — TOBRAMYCIN SULFATE 1.2 G IJ SOLR
INTRAMUSCULAR | Status: DC | PRN
  Administered 2016-04-06: 3.6 g

## 2016-04-06 MED ORDER — ALUM & MAG HYDROXIDE-SIMETH 200-200-20 MG/5ML PO SUSP
30.0000 mL | ORAL | Status: DC | PRN
  Administered 2016-04-07 – 2016-04-09 (×5): 30 mL via ORAL
  Filled 2016-04-06 (×6): qty 30

## 2016-04-06 MED ORDER — HYDROMORPHONE HCL 1 MG/ML IJ SOLN
INTRAMUSCULAR | Status: AC
  Administered 2016-04-06: 0.5 mg via INTRAVENOUS
  Filled 2016-04-06: qty 1

## 2016-04-06 MED ORDER — LIDOCAINE 2% (20 MG/ML) 5 ML SYRINGE
INTRAMUSCULAR | Status: AC
  Filled 2016-04-06: qty 5

## 2016-04-06 MED ORDER — MENTHOL 3 MG MT LOZG: 1.0000 | LOZENGE | OROMUCOSAL | Status: DC | PRN

## 2016-04-06 MED ORDER — VANCOMYCIN HCL IN DEXTROSE 1-5 GM/200ML-% IV SOLN
1000.0000 mg | Freq: Two times a day (BID) | INTRAVENOUS | Status: AC
  Administered 2016-04-06: 1000 mg via INTRAVENOUS
  Filled 2016-04-06: qty 200

## 2016-04-06 MED ORDER — ROPIVACAINE HCL 7.5 MG/ML IJ SOLN
INTRAMUSCULAR | Status: DC | PRN
  Administered 2016-04-06: 20 mL via PERINEURAL

## 2016-04-06 MED ORDER — TENOFOVIR DISOPROXIL FUMARATE 300 MG PO TABS: 300.0000 mg | ORAL_TABLET | Freq: Every day | ORAL | Status: DC

## 2016-04-06 MED ORDER — HYDROMORPHONE HCL 1 MG/ML IJ SOLN
0.2500 mg | INTRAMUSCULAR | Status: DC | PRN
Start: 2016-04-06 — End: 2016-04-06
  Administered 2016-04-06 (×2): 0.5 mg via INTRAVENOUS
  Administered 2016-04-06: 0.25 mg via INTRAVENOUS
  Administered 2016-04-06: 0.5 mg via INTRAVENOUS
  Administered 2016-04-06: 0.25 mg via INTRAVENOUS

## 2016-04-06 MED ORDER — PROPOFOL 10 MG/ML IV BOLUS
INTRAVENOUS | Status: AC
  Filled 2016-04-06: qty 40

## 2016-04-06 MED ORDER — HYDROMORPHONE HCL 1 MG/ML IJ SOLN
0.5000 mg | INTRAMUSCULAR | Status: DC | PRN
  Administered 2016-04-06: 2 mg via INTRAVENOUS
  Filled 2016-04-06: qty 2

## 2016-04-06 MED ORDER — BUPIVACAINE HCL (PF) 0.25 % IJ SOLN
INTRAMUSCULAR | Status: AC
  Filled 2016-04-06: qty 30

## 2016-04-06 MED ORDER — ROCURONIUM BROMIDE 10 MG/ML (PF) SYRINGE
PREFILLED_SYRINGE | INTRAVENOUS | Status: DC | PRN
  Administered 2016-04-06: 50 mg via INTRAVENOUS

## 2016-04-06 MED ORDER — ONDANSETRON HCL 4 MG/2ML IJ SOLN
INTRAMUSCULAR | Status: DC | PRN
  Administered 2016-04-06: 4 mg via INTRAVENOUS

## 2016-04-06 MED ORDER — LACTATED RINGERS IV SOLN
INTRAVENOUS | Status: DC | PRN
  Administered 2016-04-06 (×2): via INTRAVENOUS

## 2016-04-06 MED ORDER — PROPOFOL 10 MG/ML IV BOLUS
INTRAVENOUS | Status: DC | PRN
  Administered 2016-04-06: 280 mg via INTRAVENOUS

## 2016-04-06 MED ORDER — LABETALOL HCL 5 MG/ML IV SOLN
INTRAVENOUS | Status: AC
  Filled 2016-04-06: qty 4

## 2016-04-06 MED ORDER — DEXAMETHASONE SODIUM PHOSPHATE 10 MG/ML IJ SOLN
10.0000 mg | Freq: Once | INTRAMUSCULAR | Status: AC
  Administered 2016-04-06: 10 mg via INTRAVENOUS

## 2016-04-06 MED ORDER — METOCLOPRAMIDE HCL 5 MG/ML IJ SOLN: 5.0000 mg | Freq: Three times a day (TID) | INTRAMUSCULAR | Status: DC | PRN

## 2016-04-06 MED ORDER — MIDAZOLAM HCL 5 MG/5ML IJ SOLN
INTRAMUSCULAR | Status: DC | PRN
  Administered 2016-04-06: 2 mg via INTRAVENOUS

## 2016-04-06 MED ORDER — PHENOL 1.4 % MT LIQD
1.0000 | OROMUCOSAL | Status: DC | PRN
  Filled 2016-04-06: qty 177

## 2016-04-06 MED ORDER — SUGAMMADEX SODIUM 200 MG/2ML IV SOLN
INTRAVENOUS | Status: DC | PRN
  Administered 2016-04-06: 200 mg via INTRAVENOUS

## 2016-04-06 MED ORDER — CELECOXIB 200 MG PO CAPS
200.0000 mg | ORAL_CAPSULE | Freq: Two times a day (BID) | ORAL | Status: DC
  Administered 2016-04-06 – 2016-04-07 (×3): 200 mg via ORAL
  Filled 2016-04-06 (×4): qty 1

## 2016-04-06 MED ORDER — LIDOCAINE 2% (20 MG/ML) 5 ML SYRINGE
INTRAMUSCULAR | Status: DC | PRN
  Administered 2016-04-06: 50 mg via INTRAVENOUS

## 2016-04-06 MED ORDER — LINEZOLID 600 MG PO TABS
600.0000 mg | ORAL_TABLET | Freq: Two times a day (BID) | ORAL | Status: DC
  Administered 2016-04-06 – 2016-04-09 (×6): 600 mg via ORAL
  Filled 2016-04-06 (×6): qty 1

## 2016-04-06 MED ORDER — ZOLPIDEM TARTRATE 5 MG PO TABS
5.0000 mg | ORAL_TABLET | Freq: Every evening | ORAL | Status: DC | PRN
  Administered 2016-04-06 – 2016-04-09 (×4): 5 mg via ORAL
  Filled 2016-04-06 (×4): qty 1

## 2016-04-06 MED ORDER — VANCOMYCIN HCL 1000 MG IV SOLR
INTRAVENOUS | Status: DC | PRN
  Administered 2016-04-06: 4000 mg

## 2016-04-06 MED ORDER — HYDROMORPHONE HCL 2 MG/ML IJ SOLN
INTRAMUSCULAR | Status: AC
  Filled 2016-04-06: qty 1

## 2016-04-06 MED ORDER — LEVOTHYROXINE SODIUM 50 MCG PO TABS
175.0000 ug | ORAL_TABLET | Freq: Every day | ORAL | Status: DC
  Administered 2016-04-07 – 2016-04-10 (×4): 175 ug via ORAL
  Filled 2016-04-06 (×4): qty 1

## 2016-04-06 MED ORDER — VANCOMYCIN HCL 1000 MG IV SOLR
INTRAVENOUS | Status: AC
  Filled 2016-04-06: qty 3000

## 2016-04-06 MED ORDER — METHOCARBAMOL 1000 MG/10ML IJ SOLN
500.0000 mg | Freq: Four times a day (QID) | INTRAVENOUS | Status: DC | PRN
  Administered 2016-04-06: 500 mg via INTRAVENOUS
  Filled 2016-04-06: qty 5
  Filled 2016-04-06: qty 550

## 2016-04-06 MED ORDER — OLANZAPINE-FLUOXETINE HCL 12-50 MG PO CAPS
1.0000 | ORAL_CAPSULE | Freq: Every day | ORAL | Status: DC
  Filled 2016-04-06: qty 1

## 2016-04-06 MED ORDER — DOCUSATE SODIUM 100 MG PO CAPS
100.0000 mg | ORAL_CAPSULE | Freq: Two times a day (BID) | ORAL | Status: DC
  Administered 2016-04-06 – 2016-04-10 (×8): 100 mg via ORAL
  Filled 2016-04-06 (×8): qty 1

## 2016-04-06 MED ORDER — OXYCODONE HCL 5 MG PO TABS
10.0000 mg | ORAL_TABLET | ORAL | Status: DC
  Administered 2016-04-06 – 2016-04-10 (×23): 20 mg via ORAL
  Filled 2016-04-06 (×23): qty 4

## 2016-04-06 MED ORDER — VANCOMYCIN HCL IN DEXTROSE 1-5 GM/200ML-% IV SOLN
1000.0000 mg | Freq: Once | INTRAVENOUS | Status: AC
  Administered 2016-04-06: 1000 mg via INTRAVENOUS

## 2016-04-06 MED ORDER — ROPIVACAINE HCL 7.5 MG/ML IJ SOLN
INTRAMUSCULAR | Status: AC
  Filled 2016-04-06: qty 20

## 2016-04-06 MED ORDER — SODIUM CHLORIDE 0.9 % IJ SOLN
INTRAMUSCULAR | Status: AC
  Filled 2016-04-06: qty 50

## 2016-04-06 MED ORDER — FENTANYL CITRATE (PF) 100 MCG/2ML IJ SOLN
INTRAMUSCULAR | Status: DC | PRN
  Administered 2016-04-06: 100 ug via INTRAVENOUS

## 2016-04-06 MED ORDER — BISACODYL 10 MG RE SUPP: 10.0000 mg | Freq: Every day | RECTAL | Status: DC | PRN

## 2016-04-06 MED ORDER — MIDAZOLAM HCL 2 MG/2ML IJ SOLN
2.0000 mg | Freq: Once | INTRAMUSCULAR | Status: AC
  Administered 2016-04-06: 2 mg via INTRAVENOUS

## 2016-04-06 MED ORDER — DIPHENHYDRAMINE HCL 25 MG PO CAPS: 25.0000 mg | ORAL_CAPSULE | Freq: Four times a day (QID) | ORAL | Status: DC | PRN

## 2016-04-06 MED ORDER — METHOCARBAMOL 500 MG PO TABS
500.0000 mg | ORAL_TABLET | Freq: Four times a day (QID) | ORAL | Status: DC | PRN
  Administered 2016-04-07 – 2016-04-08 (×3): 500 mg via ORAL
  Filled 2016-04-06 (×4): qty 1

## 2016-04-06 MED ORDER — FENTANYL CITRATE (PF) 100 MCG/2ML IJ SOLN
100.0000 ug | Freq: Once | INTRAMUSCULAR | Status: AC
  Administered 2016-04-06: 100 ug via INTRAVENOUS

## 2016-04-06 MED ORDER — CHLORHEXIDINE GLUCONATE 4 % EX LIQD: 60.0000 mL | Freq: Once | CUTANEOUS | Status: DC

## 2016-04-06 SURGICAL SUPPLY — 65 items
BAG ZIPLOCK 12X15 (MISCELLANEOUS) ×3 IMPLANT
BANDAGE ACE 6X5 VEL STRL LF (GAUZE/BANDAGES/DRESSINGS) ×3 IMPLANT
BANDAGE ESMARK 6X9 LF (GAUZE/BANDAGES/DRESSINGS) ×1 IMPLANT
BLADE SAW SGTL 13.0X1.19X90.0M (BLADE) ×3 IMPLANT
BLADE SAW SGTL 81X20 HD (BLADE) ×3 IMPLANT
BNDG ESMARK 6X9 LF (GAUZE/BANDAGES/DRESSINGS) ×3
BOWL SMART MIX CTS (DISPOSABLE) IMPLANT
BRUSH FEMORAL CANAL (MISCELLANEOUS) ×3 IMPLANT
CEMENT HV SMART SET (Cement) ×9 IMPLANT
CUFF TOURN SGL QUICK 34 (TOURNIQUET CUFF) ×2
CUFF TRNQT CYL 34X4X40X1 (TOURNIQUET CUFF) ×1 IMPLANT
DERMABOND ADVANCED (GAUZE/BANDAGES/DRESSINGS) ×2
DERMABOND ADVANCED .7 DNX12 (GAUZE/BANDAGES/DRESSINGS) ×1 IMPLANT
DRAPE EXTREMITY T 121X128X90 (DRAPE) ×3 IMPLANT
DRAPE POUCH INSTRU U-SHP 10X18 (DRAPES) ×3 IMPLANT
DRAPE U-SHAPE 47X51 STRL (DRAPES) ×3 IMPLANT
DRESSING AQUACEL AG SP 3.5X10 (GAUZE/BANDAGES/DRESSINGS) ×1 IMPLANT
DRSG AQUACEL AG SP 3.5X10 (GAUZE/BANDAGES/DRESSINGS) ×3
DRSG MEPILEX BORDER 4X12 (GAUZE/BANDAGES/DRESSINGS) ×3 IMPLANT
DURAPREP 26ML APPLICATOR (WOUND CARE) ×6 IMPLANT
ELECT REM PT RETURN 9FT ADLT (ELECTROSURGICAL) ×3
ELECTRODE REM PT RTRN 9FT ADLT (ELECTROSURGICAL) ×1 IMPLANT
FACESHIELD WRAPAROUND (MASK) ×15 IMPLANT
GLOVE BIOGEL M 7.0 STRL (GLOVE) IMPLANT
GLOVE BIOGEL PI IND STRL 7.5 (GLOVE) ×5 IMPLANT
GLOVE BIOGEL PI IND STRL 8.5 (GLOVE) ×1 IMPLANT
GLOVE BIOGEL PI INDICATOR 7.5 (GLOVE) ×10
GLOVE BIOGEL PI INDICATOR 8.5 (GLOVE) ×2
GLOVE ECLIPSE 8.0 STRL XLNG CF (GLOVE) ×6 IMPLANT
GLOVE ORTHO TXT STRL SZ7.5 (GLOVE) ×6 IMPLANT
GOWN STRL REUS W/TWL LRG LVL3 (GOWN DISPOSABLE) ×9 IMPLANT
GOWN STRL REUS W/TWL XL LVL3 (GOWN DISPOSABLE) ×3 IMPLANT
HANDPIECE INTERPULSE COAX TIP (DISPOSABLE) ×2
IMMOBILIZER KNEE 20 (SOFTGOODS) ×3
IMMOBILIZER KNEE 20 THIGH 36 (SOFTGOODS) ×1 IMPLANT
KIT STIMULAN RAPID CURE  10CC (Orthopedic Implant) ×2 IMPLANT
KIT STIMULAN RAPID CURE 10CC (Orthopedic Implant) ×1 IMPLANT
MANIFOLD NEPTUNE II (INSTRUMENTS) ×3 IMPLANT
MARKER SKIN DUAL TIP RULER LAB (MISCELLANEOUS) ×3 IMPLANT
NDL SAFETY ECLIPSE 18X1.5 (NEEDLE) ×1 IMPLANT
NEEDLE HYPO 18GX1.5 SHARP (NEEDLE) ×2
NS IRRIG 1000ML POUR BTL (IV SOLUTION) ×3 IMPLANT
PAD ABD 8X10 STRL (GAUZE/BANDAGES/DRESSINGS) ×6 IMPLANT
POSITIONER SURGICAL ARM (MISCELLANEOUS) ×3 IMPLANT
SET HNDPC FAN SPRY TIP SCT (DISPOSABLE) ×1 IMPLANT
SET PAD KNEE POSITIONER (MISCELLANEOUS) ×3 IMPLANT
SPACER KASM MOLD44APX67ML KNEE (Spacer) ×3 IMPLANT
SPACER TIBIAL SM39*58 KNEE (Spacer) ×3 IMPLANT
SPONGE LAP 18X18 X RAY DECT (DISPOSABLE) ×3 IMPLANT
STAPLER VISISTAT 35W (STAPLE) ×3 IMPLANT
SUCTION FRAZIER HANDLE 12FR (TUBING) ×2
SUCTION TUBE FRAZIER 12FR DISP (TUBING) ×1 IMPLANT
SUT MNCRL AB 4-0 PS2 18 (SUTURE) ×3 IMPLANT
SUT VIC AB 1 CT1 36 (SUTURE) ×6 IMPLANT
SUT VIC AB 2-0 CT1 27 (SUTURE) ×6
SUT VIC AB 2-0 CT1 TAPERPNT 27 (SUTURE) ×3 IMPLANT
SUT VLOC 180 0 24IN GS25 (SUTURE) IMPLANT
SYR 50ML LL SCALE MARK (SYRINGE) ×3 IMPLANT
TOWEL OR 17X26 10 PK STRL BLUE (TOWEL DISPOSABLE) ×6 IMPLANT
TOWEL OR NON WOVEN STRL DISP B (DISPOSABLE) ×3 IMPLANT
TOWER CARTRIDGE SMART MIX (DISPOSABLE) ×3 IMPLANT
TRAY FOLEY W/METER SILVER 16FR (SET/KITS/TRAYS/PACK) ×3 IMPLANT
WATER STERILE IRR 1500ML POUR (IV SOLUTION) ×6 IMPLANT
WRAP KNEE MAXI GEL POST OP (GAUZE/BANDAGES/DRESSINGS) ×3 IMPLANT
YANKAUER SUCT BULB TIP 10FT TU (MISCELLANEOUS) ×3 IMPLANT

## 2016-04-06 NOTE — Anesthesia Preprocedure Evaluation (Signed)
Anesthesia Evaluation  Patient identified by MRN, date of birth, ID band Patient awake    Reviewed: Allergy & Precautions, H&P , Patient's Chart, lab work & pertinent test results, reviewed documented beta blocker date and time   Airway Mallampati: II  TM Distance: >3 FB Neck ROM: full    Dental no notable dental hx.    Pulmonary former smoker,    Pulmonary exam normal breath sounds clear to auscultation       Cardiovascular hypertension,  Rhythm:regular Rate:Normal     Neuro/Psych    GI/Hepatic   Endo/Other    Renal/GU      Musculoskeletal   Abdominal   Peds  Hematology   Anesthesia Other Findings   Reproductive/Obstetrics                            Anesthesia Physical Anesthesia Plan  ASA: II  Anesthesia Plan: General   Post-op Pain Management:    Induction: Intravenous  Airway Management Planned: LMA  Additional Equipment:   Intra-op Plan:   Post-operative Plan:   Informed Consent: I have reviewed the patients History and Physical, chart, labs and discussed the procedure including the risks, benefits and alternatives for the proposed anesthesia with the patient or authorized representative who has indicated his/her understanding and acceptance.   Dental Advisory Given and Dental advisory given  Plan Discussed with: CRNA and Surgeon  Anesthesia Plan Comments: (Discussed GA with LMA, possible sore throat, potential need to switch to ETT, N/V, pulmonary aspiration. Questions answered. )        Anesthesia Quick Evaluation

## 2016-04-06 NOTE — Interval H&P Note (Signed)
History and Physical Interval Note:  04/06/2016 9:19 AM  Juan Wiggins  has presented today for surgery, with the diagnosis of status post resection right total knee for infection  The various methods of treatment have been discussed with the patient and family. After consideration of risks, benefits and other options for treatment, the patient has consented to  Procedure(s) with comments: Right knee repeat irrigation and debridement, antibiotic spacer vers right knee reimplantation right total knee (Right) - Requests 2 hours as a surgical intervention .  The patient's history has been reviewed, patient examined, no change in status, stable for surgery.  I have reviewed the patient's chart and labs.  Questions were answered to the patient's satisfaction.     Mauri Pole

## 2016-04-06 NOTE — OR Nursing (Signed)
Called lab result to [place] while Dr Alvan Dame was in [place] with patient.  Gram stain abundant, bot PMN and mononuclear, no organisms seen.

## 2016-04-06 NOTE — Anesthesia Procedure Notes (Signed)
Procedure Name: Intubation Date/Time: 04/06/2016 10:51 AM Performed by: Maryum Batterson, Virgel Gess Pre-anesthesia Checklist: Patient identified, Emergency Drugs available, Suction available, Patient being monitored and Timeout performed Patient Re-evaluated:Patient Re-evaluated prior to inductionOxygen Delivery Method: Circle system utilized Preoxygenation: Pre-oxygenation with 100% oxygen Intubation Type: IV induction Ventilation: Mask ventilation without difficulty Laryngoscope Size: Mac and 4 Grade View: Grade III Tube type: Oral Tube size: 7.5 mm Number of attempts: 1 Airway Equipment and Method: Stylet Placement Confirmation: ETT inserted through vocal cords under direct vision,  positive ETCO2,  CO2 detector and breath sounds checked- equal and bilateral Secured at: 22 cm Tube secured with: Tape Dental Injury: Teeth and Oropharynx as per pre-operative assessment

## 2016-04-06 NOTE — Progress Notes (Signed)
AssistedDr. Glennon Mac with right, ultrasound guided, adductor canal block. Side rails up, monitors on throughout procedure. See vital signs in flow sheet. Tolerated Procedure well.

## 2016-04-06 NOTE — Transfer of Care (Signed)
Immediate Anesthesia Transfer of Care Note  Patient: Juan Wiggins  Procedure(s) Performed: Procedure(s) with comments: Right knee repeat irrigation and debridement, antibiotic spacer vers right knee reimplantation right total knee (Right) - Requests 2 hours  Patient Location: PACU  Anesthesia Type:General  Level of Consciousness:  sedated, patient cooperative and responds to stimulation  Airway & Oxygen Therapy:Patient Spontanous Breathing and Patient connected to face mask oxgen  Post-op Assessment:  Report given to PACU RN and Post -op Vital signs reviewed and stable  Post vital signs:  Reviewed and stable  Last Vitals:  Vitals:   04/06/16 0954 04/06/16 0955  BP:  (!) 106/56  Pulse: 73 77  Resp: 12 14  Temp:      Complications: No apparent anesthesia complications

## 2016-04-06 NOTE — Anesthesia Procedure Notes (Addendum)
Anesthesia Regional Block:  Adductor canal block  Pre-Anesthetic Checklist: ,, timeout performed, Correct Patient, Correct Site, Correct Laterality, Correct Procedure, Correct Position, site marked, Risks and benefits discussed, pre-op evaluation,  At surgeon's request and post-op pain management  Laterality: Right  Prep: chloraprep       Needles:   Needle Type: Echogenic Needle     Needle Length: 9cm 9 cm Needle Gauge: 21 and 21 G    Additional Needles:  Procedures: ultrasound guided (picture in chart) Adductor canal block Narrative:  Start time: 04/06/2016 9:41 AM End time: 04/06/2016 9:48 AM Injection made incrementally with aspirations every 5 mL.  Additional Notes: 20cc .75% Naropin

## 2016-04-06 NOTE — Anesthesia Postprocedure Evaluation (Signed)
Anesthesia Post Note  Patient: Juan Wiggins  Procedure(s) Performed: Procedure(s) (LRB): Right knee repeat irrigation and debridement, antibiotic spacer vers right knee reimplantation right total knee (Right)  Patient location during evaluation: PACU Anesthesia Type: General Level of consciousness: sedated Pain management: satisfactory to patient Vital Signs Assessment: post-procedure vital signs reviewed and stable Respiratory status: spontaneous breathing Cardiovascular status: stable Anesthetic complications: no       Last Vitals:  Vitals:   04/06/16 1500 04/06/16 1605  BP: (!) 144/66 132/68  Pulse: 88 86  Resp: 16 16  Temp: 36.8 C 36.5 C    Last Pain:  Vitals:   04/06/16 1605  TempSrc: Oral  PainSc:                  Juan Wiggins

## 2016-04-06 NOTE — Consult Note (Signed)
[facility] for Infectious Disease    Date of Admission:  04/06/2016   Near continuous antibiotic therapy since 07/14/2015        Day 19 linezolid              Reason for Consult: Persistent infection of right prosthetic knee    Referring Physician: Dr. Adriana Mccallum  Principal Problem:   Infection of prosthetic right knee joint [facility]) Active Problems:   Staphylococcal arthritis of right knee ([place])   Acquired absence of knee joint following explantation of joint prosthesis with presence of antibiotic-impregnated cement spacer   S/P total knee arthroplasty   Chronic hepatitis C without hepatic coma ([place])   Chronic hepatitis B without delta agent without cirrhosis ([place])   Hypothyroidism   Major depression   Essential hypertension   GERD (gastroesophageal reflux disease)   Chronic pain syndrome   Normocytic anemia   Seizure (HCC)   Hx of mixed drug abuse   Chronic renal insufficiency, stage 3 (moderate)   Rheumatoid arthritis ([place])   . acetaminophen  1,000 mg Oral Q8H  . aspirin  81 mg Oral BID  . celecoxib  200 mg Oral Q12H  . [START ON 04/07/2016] dexamethasone  10 mg Intravenous Once  . docusate sodium  100 mg Oral BID  . [START ON 04/07/2016] escitalopram  20 mg Oral Daily  . ferrous sulfate  325 mg Oral TID PC  . [START ON 04/07/2016] levothyroxine  175 mcg Oral QAC breakfast  . linezolid  600 mg Oral BID  . OLANZapine-FLUoxetine  1 capsule Oral QHS  . oxyCODONE  10-20 mg Oral Q4H  . polyethylene glycol  17 g Oral BID  . [START ON 04/07/2016] tamsulosin  0.4 mg Oral QPC breakfast  . [START ON 04/07/2016] tenofovir  300 mg Oral Daily  . [START ON 04/07/2016] valACYclovir  1,000 mg Oral Daily  . vancomycin  1,000 mg Intravenous Q12H    Recommendations: 1. Continue linezolid pending operative culture results 2. Check hepatitis C viral load and complete metabolic panel 3. Continue tenofovir for hepatitis B at a reduced dose of every other day  Assessment: He  has struggled with right prosthetic knee infection for almost 1 year. Initially he was infected with group G strep but became superinfected with MSSA last fall. Despite multiple surgeries including resection arthroplasty and spacer placement and prolonged courses of antibiotics he has had persistently positive cultures and recurrent effusions. I will continue linezolid pending his latest operative cultures.  He completed 3 months of Epclusa in early December for his hepatitis C. He has had 2 undetectable viral loads since completion of therapy. I will repeat a viral load now to verify sustained virologic remission and presumed cure.   He is on fire read for chronic active hepatitis B. He has responded well to therapy and his viral load one month ago was undetectable. He has some acute on chronic renal insufficiency. I will change his Viread to every other day dosing.    he has had significant unintentional weight loss starting one year ago. This began when he was admitted with severe necrotizing fasciitis of his right arm due to group A strep. He required transient hemodialysis. He says that he has continued to lose weight gradually since that time although he is not trying to lose. He has some intermittent nausea but states that his appetite is good and he is eating well. He does admit to some worsened depression  over the past year. There is also been concern about ongoing drug use. Urine drug screen in May of last year showed opiates, cocaine and THC.  HPI cure.: Juan Wiggins is a 66 y.o. male with degenerative joint disease who underwent bilateral total knee replacements in 2015. In May of this year he was hospitalized with group C strep infection of his right total knee. He has had an extremely complicated course since that time. Major milestones are listed below:  07/14/2015 incision and drainage and polyps change of right prosthetic knee. Operative cultures grew group C strep. He received 6 weeks of  IV ceftriaxone along with intermittent rifampin, completing therapy on 09/02/2015. He was then converted to oral amoxicillin.  12/29/2015 he developed increase pain and swelling of his right knee leading to rehospitalization. He underwent arthroscopic incision and drainage. Operative cultures grew MSSA. He had multiple urine drug screens positive for cocaine. Because he also had a history of hepatitis B and C there was concern for ongoing injecting drug use. His amoxicillin was changed to IM ceftriaxone after he refused to go to a skilled nursing facility for IV antibiotic therapy. There was some problem after discharge and he never received his IM ceftriaxone.  01/15/2016 he was readmitted and underwent incision and drainage again. Operative cultures grew MSSA again. He went back to the OR on 01/21/2016 and underwent resection arthroplasty with spacer placement. He was treated with IV daptomycin and discharged to a skilled nursing facility with the plan to continue daptomycin until 02/27/2016. He left the skilled nursing facility on 02/11/2016. His PICC was removed and he started on linezolid completing therapy on 02/27/2016.  03/10/2016 he was seen back in our office by Dr. Baxter Flattery. His knee was more swollen. He was referred back to orthopedics where he underwent aspiration on 03/13/2016. A verbal report indicates that he grew MSSA again. Linezolid was restarted on 03/18/2016.  04/06/2016 he was readmitted today and underwent repeat debridement with spacer exchange. No organisms were seen on operative Gram stain.  Review of Systems: Review of Systems  Constitutional: Positive for chills, malaise/fatigue and weight loss. Negative for diaphoresis and fever.  HENT: Negative for sore throat.   Respiratory: Negative for cough, sputum production and shortness of breath.   Cardiovascular: Negative for chest pain.  Gastrointestinal: Positive for nausea. Negative for abdominal pain, diarrhea, heartburn and  vomiting.  Genitourinary: Negative for dysuria and frequency.  Musculoskeletal: Positive for joint pain. Negative for myalgias.  Skin: Negative for rash.  Neurological: Negative for dizziness and headaches.  Psychiatric/Behavioral: Positive for depression. The patient is nervous/anxious.     Past Medical History:  Diagnosis Date  . Acute renal failure (ARF) ([place]) 2017   TOOK HEMODIALYSIS X 5 WEEKS   . Anxiety   . Arthritis    RHEUMATOID ARTHRITIS; OA LEFT KNEE  . Cancer (HCC)    MELANOMA REMOVED RT SHOULDER  . Depression   . Family history of adverse reaction to anesthesia    MOTHER AND SISTER SLOW TO WAKE UP  . GERD (gastroesophageal reflux disease)    PREVACID IF NEEDED  . Heart murmur   . Hemorrhoids   . Hepatitis A    PT STATES TYPE OF HEPATITIS YOU GET FROM SHELLFISH  . Hepatitis B   . Hepatitis C   . Hypertension    OFF ALL MEDS LAST 2 MONTHS   . Hypothyroidism   . Inguinal hernia    RIGHT  . Lower back pain    TOLD  SCIATIC NERVE PINCHED - MAY NEED SURGERY IN FUTURE  . Pancreatitis, alcoholic, acute [id number]  . Pneumonia 01/02/2016  . Seizures ([place]) 2017   ONCE WHILE IN HOSPITAL NONE SINCE  . Septic arthritis ([place]) 01/14/2016   RT KNEE  . Sleep apnea    CLAUSTROPHOBIC - COULD NOT TOLERATE CPAP MASK  . Tachycardia    PT STATES HIS HEART RATE USUALLY 100 OR MORE    Social History  Substance Use Topics  . Smoking status: Former Smoker    Packs/day: 1.50    Years: 35.00    Types: Cigars    Quit date: 02/22/1998  . Smokeless tobacco: Never Used     Comment: used cigarettes 20 years ago  . Alcohol use No     Comment: " last alcohol use was in December 2015."    Family History  Problem Relation Age of Onset  . Hypertension Mother   . Other Mother     varicose veins  . Hypertension Sister    Allergies  Allergen Reactions  . Cefazolin Other (See Comments)    Patient had a seizure while on cefazolin and no other core cause could be identified other  than the beta lactam  . Heparin Other (See Comments)    Low platelets (130s), no HIT testing performed, platelets recovered.  . Wellbutrin [Bupropion] Other (See Comments)    Hallucinations-started on 150mg  daily dosage    OBJECTIVE: Blood pressure (!) 144/66, pulse 88, temperature 98.3 F (36.8 C), temperature source Oral, resp. rate 16, height 6' 0.5" (1.842 m), weight 172 lb (78 kg), SpO2 100 %.  Physical Exam  Constitutional: He is oriented to person, place, and time.  He is alert and in no distress. His father and their caregiver visiting.  HENT:  Mouth/Throat: No oropharyngeal exudate.  Cardiovascular: Normal rate and regular rhythm.   No murmur heard. Pulmonary/Chest: Effort normal. He has no wheezes. He has no rales.  Abdominal: Soft. There is no tenderness.  Musculoskeletal:  His right leg is in a soft brace.  Neurological: He is alert and oriented to person, place, and time.  Skin: No rash noted.  Psychiatric: Mood and affect normal.    Lab Results Lab Results  Component Value Date   WBC 9.4 03/31/2016   HGB 9.5 (L) 03/31/2016   HCT 29.4 (L) 03/31/2016   MCV 89.1 03/31/2016   PLT 360 03/31/2016    Lab Results  Component Value Date   CREATININE 2.03 (H) 03/31/2016   BUN 24 (H) 03/31/2016   NA 134 (L) 03/31/2016   K 4.9 03/31/2016   CL 104 03/31/2016   CO2 24 03/31/2016    Lab Results  Component Value Date   ALT 13 (L) 03/31/2016   AST 20 03/31/2016   ALKPHOS 102 03/31/2016   BILITOT 0.3 03/31/2016     Microbiology: Recent Results (from the past 240 hour(s))  Surgical pcr screen     Status: None   Collection Time: 03/31/16 11:30 AM  Result Value Ref Range Status   MRSA, PCR NEGATIVE NEGATIVE Final   Staphylococcus aureus NEGATIVE NEGATIVE Final    Comment:        The Xpert SA Assay (FDA approved for NASAL specimens in patients over 74 years of age), is one component of a comprehensive surveillance program.  Test performance has been validated  by [facility] for patients greater than or equal to 65 year old. It is not intended to diagnose infection nor to guide or monitor treatment.  Aerobic/Anaerobic Culture (surgical/deep wound)     Status: None (Preliminary result)   Collection Time: 04/06/16 11:18 AM  Result Value Ref Range Status   Specimen Description SYNOVIAL RT KNEE  Final   Special Requests NONE  Final   Gram Stain   Final    ABUNDANT WBC PRESENT,BOTH PMN AND MONONUCLEAR NO ORGANISMS SEEN Gram Stain Report Called to,Read Back By and Verified With: HERRSCHAFT,M. RN @1206  ON 2.12.18 BY [facility]    Culture PENDING  Incomplete   Report Status PENDING  Incomplete    Michel Bickers, MD [facility] for [facility] [phone] pager   [phone] cell 04/06/2016, 3:56 PM

## 2016-04-07 LAB — BASIC METABOLIC PANEL
Anion gap: 6 (ref 5–15)
BUN: 29 mg/dL — AB (ref 6–20)
CHLORIDE: 108 mmol/L (ref 101–111)
CO2: 23 mmol/L (ref 22–32)
CREATININE: 1.92 mg/dL — AB (ref 0.61–1.24)
Calcium: 9.1 mg/dL (ref 8.9–10.3)
GFR calc Af Amer: 41 mL/min — ABNORMAL LOW (ref >60)
GFR calc non Af Amer: 35 mL/min — ABNORMAL LOW (ref >60)
GLUCOSE: 109 mg/dL — AB (ref 65–99)
Potassium: 5.3 mmol/L — ABNORMAL HIGH (ref 3.5–5.1)
Sodium: 137 mmol/L (ref 135–145)

## 2016-04-07 LAB — CBC
HEMATOCRIT: 23.5 % — AB (ref 39.0–52.0)
HEMOGLOBIN: 7.9 g/dL — AB (ref 13.0–17.0)
MCH: 30.3 pg (ref 26.0–34.0)
MCHC: 33.6 g/dL (ref 30.0–36.0)
MCV: 90 fL (ref 78.0–100.0)
Platelets: 289 10*3/uL (ref 150–400)
RBC: 2.61 MIL/uL — ABNORMAL LOW (ref 4.22–5.81)
RDW: 15.5 % (ref 11.5–15.5)
WBC: 10.3 10*3/uL (ref 4.0–10.5)

## 2016-04-07 MED ORDER — FLUOXETINE HCL 20 MG PO CAPS
50.0000 mg | ORAL_CAPSULE | Freq: Every day | ORAL | Status: DC
  Administered 2016-04-07 – 2016-04-09 (×3): 50 mg via ORAL
  Filled 2016-04-07 (×3): qty 1

## 2016-04-07 MED ORDER — OLANZAPINE 2.5 MG PO TABS
12.5000 mg | ORAL_TABLET | Freq: Every day | ORAL | Status: DC
  Administered 2016-04-07 – 2016-04-09 (×3): 12.5 mg via ORAL
  Filled 2016-04-07 (×3): qty 1

## 2016-04-07 NOTE — Progress Notes (Signed)
OT Cancellation Note  Patient Details Name: Juan Wiggins MRN: [id number] DOB: [birth year]   Cancelled Treatment:    Reason Eval/Treat Not Completed: OT screened, no needs identified, will sign off.  Pt is PWB now, but he states he put very little weight on leg prior to sx.  He uses crutches to get into tub with seat, and there is no way for him to back onto seat first.  Asked him to clarify with Dr Alvan Dame whether he is allowed to shower.  Pt states he is very careful and deliberate.  Aslynn Brunetti 04/07/2016, 10:55 AM  Lesle Chris, OTR/L [phone] 04/07/2016

## 2016-04-07 NOTE — Brief Op Note (Signed)
04/06/2016  11:45AM  PATIENT:  Juan Wiggins  66 y.o. male  PRE-OPERATIVE DIAGNOSIS:  status post resection right total knee for infection  POST-OPERATIVE DIAGNOSIS:  status post resection right total knee for infection  PROCEDURE:  Procedure(s) with comments: Right knee repeat irrigation and debridement, antibiotic spacer vers right knee reimplantation right total knee (Right) -   SURGEON:  Surgeon(s) and Role:    * Paralee Cancel, MD - Primary  PHYSICIAN ASSISTANT: Danae Orleans, PA-C  ANESTHESIA:   regional and general  EBL:  No intake/output data recorded.  BLOOD ADMINISTERED:none  DRAINS: none   LOCAL MEDICATIONS USED:  NONE  SPECIMEN:  Source of Specimen:  right knee swab and fluid  DISPOSITION OF SPECIMEN:  PATHOLOGY  COUNTS:  YES  TOURNIQUET:   Total Tourniquet Time Documented: Thigh (Right) - 70 minutes Total: Thigh (Right) - 70 minutes   DICTATION: .Other Dictation: Dictation Number [id number]  PLAN OF CARE: Admit to inpatient   PATIENT DISPOSITION:  PACU - hemodynamically stable.   Delay start of Pharmacological VTE agent (>24hrs) due to surgical blood loss or risk of bleeding: no

## 2016-04-07 NOTE — Evaluation (Signed)
Physical Therapy Evaluation Patient Details Name: Juan Wiggins MRN: [id number] DOB: 08-Oct-1950 Today's Date: 04/07/2016   History of Present Illness  66 yo male s/p repeat I&D with removal of previous static abx spacer and reimplantation of articulating  abx spacer (previous TKA and spacer per Dr Gladstone Lighter); PMHx: Hep B & C, drug abuse, chronic pain, bil TKAs, MDD, anxiety  Clinical Impression  Pt admitted with above diagnosis. Pt currently with functional limitations due to the deficits listed below (see PT Problem List).  Pt will benefit from skilled PT to increase their independence and safety with mobility to allow discharge to the venue listed below.  Pt  With limited functional mobility d/t post op pain and weakness, anticipate good progress, will continue to follow in acute setting     Follow Up Recommendations Home health PT    Equipment Recommendations  None recommended by PT    Recommendations for Other Services       Precautions / Restrictions Precautions Precaution Comments: ROM as tolerated Restrictions Weight Bearing Restrictions: Yes RLE Weight Bearing: Partial weight bearing RLE Partial Weight Bearing Percentage or Pounds: 50%      Mobility  Bed Mobility Overal bed mobility: Needs Assistance Bed Mobility: Supine to Sit     Supine to sit: Supervision     General bed mobility comments: for safety  Transfers Overall transfer level: Needs assistance Equipment used: Crutches Transfers: Sit to/from Stand Sit to Stand: Min guard         General transfer comment: cues for safety  Ambulation/Gait Ambulation/Gait assistance: Min guard;Min assist Ambulation Distance (Feet): 120 Feet Assistive device: Crutches Gait Pattern/deviations: Step-to pattern;Decreased weight shift to right     General Gait Details: cues for sequence, pt wt bears very little on RLE d/t pain  Stairs            Wheelchair Mobility    Modified Rankin (Stroke Patients  Only)       Balance Overall balance assessment: Needs assistance Sitting-balance support: Feet supported;No upper extremity supported Sitting balance-Leahy Scale: Good       Standing balance-Leahy Scale: Poor Standing balance comment: min/guard to maintain safe standing balance without UE support                             Pertinent Vitals/Pain Pain Assessment: 0-10 Pain Score: 6  Pain Location: right Pain Descriptors / Indicators: Discomfort;Sore Pain Intervention(s): Monitored during session;Limited activity within patient's tolerance;Premedicated before session;Repositioned    Home Living Family/patient expects to be discharged to:: Private residence Living Arrangements: Parent;Other (Comment) Available Help at Discharge: Family;Available 24 hours/day Type of Home: House Home Access: Stairs to enter Entrance Stairs-Rails: Can reach both Entrance Stairs-Number of Steps: 5 Home Layout: Two level Home Equipment: Walker - 2 wheels;Cane - single point;Bedside commode;Shower seat;Hand held shower head;Wheelchair - manual;Crutches      Prior Function Level of Independence: Independent with assistive device(s)         Comments: amb with crutches, w/c for longer distances     Hand Dominance        Extremity/Trunk Assessment        Lower Extremity Assessment Lower Extremity Assessment: RLE deficits/detail;Generalized weakness RLE Deficits / Details: limited AROM and  strength d/t post op pain and weakness RLE: Unable to fully assess due to pain       Communication      Cognition Arousal/Alertness: Awake/alert Behavior During Therapy: [facility] for tasks assessed/performed  Overall Cognitive Status: Within Functional Limits for tasks assessed                      General Comments      Exercises Total Joint Exercises Ankle Circles/Pumps: AROM;Both;5 reps   Assessment/Plan    PT Assessment Patient needs continued PT services  PT Problem  List Decreased strength;Decreased activity tolerance;Decreased range of motion;Decreased mobility;Pain          PT Treatment Interventions DME instruction;Gait training;Functional mobility training;Therapeutic activities;Therapeutic exercise;Patient/family education;Stair training    PT Goals (Current goals can be found in the Care Plan section)  Acute Rehab PT Goals Patient Stated Goal: knee better PT Goal Formulation: With patient Time For Goal Achievement: 04/14/16 Potential to Achieve Goals: Good    Frequency 7X/week   Barriers to discharge        Co-evaluation               End of Session Equipment Utilized During Treatment: Gait belt Activity Tolerance: Patient tolerated treatment well Patient left: with call bell/phone within reach;in chair;with chair alarm set           Time: [id number] PT Time Calculation (min) (ACUTE ONLY): 24 min   Charges:   PT Evaluation $PT Eval Low Complexity: 1 Procedure PT Treatments $Gait Training: 8-22 mins   PT G Codes:        Ashleynicole Mcclees April 16, 2016, 11:45 AM

## 2016-04-07 NOTE — Progress Notes (Signed)
Patient ID: Juan Wiggins, male   DOB: 11-10-1950, 66 y.o.   MRN: [id number]          [facility] for Infectious Disease    Date of Admission:  04/06/2016     The operative cultures from his right knee are pending. I will continue linezolid for now. This is a very difficult situation. He has been on nearly continuous antibiotic therapy since May of last year. I will follow-up tomorrow.         Michel Bickers, MD [facility] for [facility] [phone] pager   [phone] cell

## 2016-04-07 NOTE — Progress Notes (Signed)
Patient ID: Juan Wiggins, male   DOB: [birth year], 66 y.o.   MRN: [id number] Subjective: 1 Day Post-Op Procedure(s) (LRB): Right knee repeat irrigation and debridement, antibiotic spacer vers right knee reimplantation right total knee (Right)    Patient reports pain as moderate but well controlled.  He expresses that he feels pretty good at this point compared to previous surgeries  Objective:   VITALS:   Vitals:   04/07/16 0211 04/07/16 0458  BP: (!) 146/62 (!) 106/91  Pulse: 86 91  Resp: 16 16  Temp: 98.4 F (36.9 C) 98.7 F (37.1 C)    Neurovascular intact Incision: dressing C/D/I in knee immobilizer  LABS  Recent Labs  04/07/16 0450  HGB 7.9*  HCT 23.5*  WBC 10.3  PLT 289     Recent Labs  04/07/16 0450  NA 137  K 5.3*  BUN 29*  CREATININE 1.92*  GLUCOSE 109*    No results for input(s): LABPT, INR in the last 72 hours.   Assessment/Plan: 1 Day Post-Op Procedure(s) (LRB): Right knee repeat irrigation and debridement, antibiotic spacer vers right knee reimplantation right total knee (Right)   Advance diet Up with therapy Continue ABX therapy due to persistent PJI (infection)  ID consulted to help with appropriate ABX given his history Complex medical problems potentially effecting immune system  Potential D/C tomorrow versus Thursday pending above Up with therapy - PWB RLE ROM of knee permitted as tolerable

## 2016-04-07 NOTE — Progress Notes (Signed)
   04/07/16 1400  PT Visit Information  Last PT Received On 04/07/16  Assistance Needed +1  History of Present Illness 66 yo male s/p repeat I&D with removal of previous static abx spacer and reimplantation of articulating  abx spacer (previous TKA and spacer per Dr Gladstone Lighter); PMHx: Hep B & C, drug abuse, chronic pain, bil TKAs, MDD, anxiety  Subjective Data  Patient Stated Goal knee better  Precautions  Precautions Knee;Fall  Precaution Comments ROM as tolerated  Required Braces or Orthoses Knee Immobilizer - Right  Restrictions  Weight Bearing Restrictions No  RLE Weight Bearing PWB  RLE Partial Weight Bearing Percentage or Pounds 50%  Pain Assessment  Pain Assessment 0-10  Pain Score 6  Pain Location right  Pain Descriptors / Indicators Discomfort;Sore  Pain Intervention(s) Limited activity within patient's tolerance;Monitored during session;Premedicated before session  Cognition  Arousal/Alertness Awake/alert  Behavior During Therapy WFL for tasks assessed/performed  Overall Cognitive Status Within Functional Limits for tasks assessed  Bed Mobility  General bed mobility comments (NT, in chair)  Transfers  Overall transfer level Needs assistance  Equipment used Crutches  Transfers Sit to/from Stand  Sit to Stand Supervision;Min guard  General transfer comment cues for safety  Ambulation/Gait  Ambulation/Gait assistance Min guard;Supervision  Ambulation Distance (Feet) 120 Feet  Assistive device Crutches  Gait Pattern/deviations Step-to pattern;Decreased weight shift to right  General Gait Details cues for sequence, able to WB closer to PWB/50% on RLE   Total Joint Exercises  Ankle Circles/Pumps AROM;Both;10 reps  Quad Sets AROM;Strengthening;Both;10 reps  Heel Slides AAROM;Right;10 reps  Hip ABduction/ADduction AROM;AAROM;Right;10 reps  Straight Leg Raises AAROM;Right;10 reps  Short Arc [employer];Right;10 reps  PT - End of Session  Activity Tolerance Patient  tolerated treatment well  Patient left with call bell/phone within reach;in chair;with chair alarm set  PT - Assessment/Plan  PT Plan Current plan remains appropriate  PT Frequency (ACUTE ONLY) 7X/week  Follow Up Recommendations Home health PT  PT equipment None recommended by PT  PT Goal Progression  Progress towards PT goals Progressing toward goals  Acute Rehab PT Goals  PT Goal Formulation With patient  Time For Goal Achievement 04/14/16  Potential to Achieve Goals Good  PT Time Calculation  PT Start Time (ACUTE ONLY) 1344  PT Stop Time (ACUTE ONLY) 1410  PT Time Calculation (min) (ACUTE ONLY) 26 min  PT General Charges  $$ ACUTE PT VISIT 1 Procedure  PT Treatments  $Gait Training 8-22 mins  $Therapeutic Exercise 8-22 mins

## 2016-04-07 NOTE — Care Management Note (Addendum)
Case Management Note  Patient Details  Name: Juan Wiggins MRN: [id number] Date of Birth: 10-23-1950  Subjective/Objective:                  resection arthroplasty, placement of antibiotic spacer Action/Plan: Discharge planning Expected Discharge Date:                  Expected Discharge Plan:  [facility]  In-House Referral:     Discharge planning Services  CM Consult  Post Acute Care Choice:  Home Health Choice offered to:  Patient  DME Arranged:  IV pump/equipment DME Agency:  [name]:  RN, PT [facility] Agency:  [facility]  Status of Service:  In process, will continue to follow  If discussed at Long Length of Stay Meetings, dates discussed:    Additional Comments: CM met with pt to offer choice of home health agency. Pt chooses [facility] to render HHRN/PT. Waiting for ID MD; possible IV ABX at home; tentative referral given to [facility] rep, Joelene Millin who states they will follow.  Referral given  to [facility], Tim who is aware of possible home IV ABX.  Pt states he has all DME needed at home. CM will follow for progress. Dellie Catholic, RN 04/07/2016, 11:54 AM

## 2016-04-07 NOTE — Op Note (Signed)
NAME:  Juan Wiggins, Juan Wiggins NO.:  [id number]  MEDICAL RECORD NO.:  [id number]  LOCATION:                                 FACILITY:  PHYSICIAN:  Pietro Cassis. Alvan Dame, M.D.  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  PROCEDURE: 1. Removal of deep implant. 2. Repeat excisional and non-excisional debridement, right knee. 3. Placement of antibiotic articulating spacer utilizing 3 g of     vancomycin and 3.6 g of tobramycin in the cement, as well as 1 g of     vancomycin in Stimulan beads.  SURGEON:  Pietro Cassis. Alvan Dame, M.D.  ASSISTANTS:  Danae Orleans, PAC.  ANESTHESIA:  General plus a regional block.  SPECIMENS:  Joint fluid was swabbed as well as taken in a syringe and sent to Pathology for Gram stain and evaluation.  DRAINS:  None.  COMPLICATIONS:  None.  TOURNIQUET TIME:  Was 77 minutes at 250 mmHg.  INDICATIONS FOR PROCEDURE:  Mr. Juan Wiggins is a 66 year old male with a history of index right total knee arthroplasty performed by one of my partners.  He had presented in a relatively acute time with a concern for infection and had initially 2 separate I and D's of the knee.  Given the recurrence and difficulty in managing his infectious processes, he was ultimately taken to the operating room about 8 weeks ago by Dr. Gladstone Lighter and had his knee implant resected.  The knee was removed other than the patella.  An antibiotic static spacer was placed.  He was placed on oral antibiotics based on concerns for IV drug use and PICC line placement.  He was seen and evaluated and followed by Infectious Disease.  He was then seen in my office  for evaluation and management definitively.  After a period of 6 weeks, we did aspirate his knee joint and found that he was positive for alpha- defensin, concerning for persistent recurring infection in his right knee.  Based on this, I felt his best option at this point was to return to the operating from the I and D for redo of the antibiotic spacer and attempt to retrieve this infection.  The risks, benefits, and necessity of procedure were discussed and reviewed.  Consent was obtained.  PROCEDURE IN DETAIL:  The patient was brought to the operative theater. Once adequate anesthesia, preoperative antibiotics, 1 g of Ancef administered.  Note that he had been on Zyvox perioperatively.  He was positioned supine with a right thigh tourniquet placed.  The right lower extremity was then prepped and draped in sterile fashion.  Time-out was performed identifying the patient, planned procedure, and extremity. His right lower extremity was placed in [facility] leg holder.  His old incision was excised from the proximal to the most distal aspect.  Soft tissue plane was created.  On the inferior aspect of the wound, we found an area of necrotic-appearing/infectious region along  the inferior aspect of the patellar tendon.  This did not erode it through the skin nor the joint surface on direct inspection or palpation.  Once the joint was exposed, an arthrotomy was made.  At this point, we obtained joint fluid as the specimen using the syringe, sending off about 8 mL of fluid to the lab for Gram stain evaluation.  At this point, I exposed the knee joint and then spent the vast majority of time performing an excisional debridement.  Thus, we at this point, had excised scar noted to the skin.  We also spent time excising nonviable tissue in the intra-articular aspect in the subcapsular tissue, exposing the joint.  Once the medial, lateral, and suprapatellar pouches were recreated, attention was  directed to patella.  The patella had been previously retained.  At this point, with the patella exposed and eburnated, I used an oscillating saw and removed the patellar button.  I then used a drill to remove the plastic pegs.  Once this was done, further exposure of the knee was carried out over the lateral aspect of the joint.  The old cement spacer then was removed.  Once this was done, the femoral and tibial canals were opened. I then used a curette and Cobb elevator to debride all nonviable fibrous tissue off the ends of the bones.  Once all bone surfaces were adequately exposed and debrided, I used a canal brush irrigator and irrigated the femoral and tibial canals.  We then irrigated the knee joint with a total of 3 L of normal saline solution.  While this was being done, we went ahead and mixed 1 g of vancomycin with the Stimulan bead production and made those on the back table.  In addition, we began the process of the KASM articulating spacer mold. We measured the femur to be a size medium and the tibia to be a small. The femoral and tibial molds were constructed on the back table and then the femoral mold was held onto the distal femur that had been prepared, debrided, and dried.  Once the cement had fully cured, the femoral mold tray was removed from the cemented distal femur, which remained in place.  While this was going on, a 3rd batch of cement was mixed.  Some of this cement was placed into the proximal tibia.  Then, some of the other cement was used to cement the tibial tray that had been placed and measured in depth on top of the proximal tibia.  Once this was in place, the knee was brought to extension until the cement fully cured.  While this was being done, we irrigated with another 1500 mL of normal saline solution.  At this point, the extensor mechanism was reapproximated using #1 PDS suture.  The remainder of wound was closed with 2-0 Vicryl and then  staples on the skin.  The skin was cleaned, dried, and dressed sterilely using Mepilex dressing and a bulky wrap with the knee in extension.  He was placed in a knee immobilizer.  He will be admitted to the hospital.  We will work with Infectious Disease to come up with the best form of treatment from an antibiotic standpoint based on cultures and his history.  We will continue to monitor the overall progress of his knee, infection, and management as we had in the past with repeat Synovasure most likely required to identify clearance of infection after 6 more weeks of antibiotic treatment.     Pietro Cassis  Alvan Dame, M.D.     MDO/MEDQ  D:  04/06/2016  T:  04/07/2016  Job:  [id number]

## 2016-04-08 LAB — BASIC METABOLIC PANEL
Anion gap: 4 — ABNORMAL LOW (ref 5–15)
BUN: 34 mg/dL — AB (ref 6–20)
CO2: 22 mmol/L (ref 22–32)
CREATININE: 1.98 mg/dL — AB (ref 0.61–1.24)
Calcium: 9.5 mg/dL (ref 8.9–10.3)
Chloride: 110 mmol/L (ref 101–111)
GFR calc Af Amer: 40 mL/min — ABNORMAL LOW (ref >60)
GFR, EST NON AFRICAN AMERICAN: 34 mL/min — AB (ref >60)
GLUCOSE: 95 mg/dL (ref 65–99)
POTASSIUM: 4.7 mmol/L (ref 3.5–5.1)
SODIUM: 136 mmol/L (ref 135–145)

## 2016-04-08 LAB — CBC
HCT: 21.8 % — ABNORMAL LOW (ref 39.0–52.0)
Hemoglobin: 7.3 g/dL — ABNORMAL LOW (ref 13.0–17.0)
MCH: 29.9 pg (ref 26.0–34.0)
MCHC: 33.5 g/dL (ref 30.0–36.0)
MCV: 89.3 fL (ref 78.0–100.0)
PLATELETS: 259 10*3/uL (ref 150–400)
RBC: 2.44 MIL/uL — ABNORMAL LOW (ref 4.22–5.81)
RDW: 15.5 % (ref 11.5–15.5)
WBC: 8.1 10*3/uL (ref 4.0–10.5)

## 2016-04-08 LAB — HCV RNA QUANT: HCV QUANT: NOT DETECTED [IU]/mL (ref >50)

## 2016-04-08 MED FILL — ONDANSETRON HCL 4 MG TABLET: 4 | 7 days supply | Qty: 20 | Fill #0

## 2016-04-08 NOTE — Progress Notes (Signed)
Physical Therapy Treatment Patient Details Name: Juan Wiggins MRN: [id number] DOB: [birth year] Today's Date: 04/08/2016    History of Present Illness 66 yo male s/p repeat I&D with removal of previous static abx spacer and reimplantation of articulating  abx spacer (previous TKA and spacer per Dr Gladstone Lighter); PMHx: Hep B & C, drug abuse, chronic pain, bil TKAs, MDD, anxiety    PT Comments    Pt progressing well, will continue to follow current plan of care  Follow Up Recommendations  Home health PT     Equipment Recommendations  None recommended by PT    Recommendations for Other Services       Precautions / Restrictions Precautions Precautions: Knee;Fall Precaution Comments: ROM as tolerated Required Braces or Orthoses: Knee Immobilizer - Right Knee Immobilizer - Right: Other (comment) (as needed) Restrictions Weight Bearing Restrictions: Yes RLE Weight Bearing: Partial weight bearing RLE Partial Weight Bearing Percentage or Pounds: 50%    Mobility  Bed Mobility Overal bed mobility: Needs Assistance Bed Mobility: Supine to Sit     Supine to sit: Supervision     General bed mobility comments: for safety  Transfers Overall transfer level: Needs assistance Equipment used: Crutches Transfers: Sit to/from Stand Sit to Stand: Supervision         General transfer comment: cues for safety  Ambulation/Gait Ambulation/Gait assistance: Min guard;Supervision Ambulation Distance (Feet): 120 Feet Assistive device: Crutches Gait Pattern/deviations: Step-to pattern;Decreased weight shift to right     General Gait Details: cues for sequence, PWB   Stairs            Wheelchair Mobility    Modified Rankin (Stroke Patients Only)       Balance                                    Cognition Arousal/Alertness: Awake/alert Behavior During Therapy: WFL for tasks assessed/performed Overall Cognitive Status: Within Functional Limits for tasks  assessed                      Exercises Total Joint Exercises Ankle Circles/Pumps: AROM;Both;10 reps (instructed in df and toe ext stretch--pt c/o exc toe flex)    General Comments        Pertinent Vitals/Pain Pain Assessment: 0-10 Pain Score: 5  Pain Location: right Pain Descriptors / Indicators: Discomfort;Sore Pain Intervention(s): Limited activity within patient's tolerance;Monitored during session;Premedicated before session;Repositioned    Home Living                      Prior Function            PT Goals (current goals can now be found in the care plan section) Acute Rehab PT Goals Patient Stated Goal: knee better PT Goal Formulation: With patient Time For Goal Achievement: 04/14/16 Potential to Achieve Goals: Good Progress towards PT goals: Progressing toward goals    Frequency    7X/week      PT Plan Current plan remains appropriate    Co-evaluation             End of Session Equipment Utilized During Treatment: Gait belt Activity Tolerance: Patient tolerated treatment well Patient left: with call bell/phone within reach;in chair;with chair alarm set     Time: 1020-1040 PT Time Calculation (min) (ACUTE ONLY): 20 min  Charges:  $Gait Training: 8-22 mins  G CodesKenyon Ana 04/08/2016, 11:49 AM

## 2016-04-08 NOTE — Progress Notes (Signed)
   04/08/16 1400  PT Visit Information  Last PT Received On 04/08/16  Assistance Needed +1  History of Present Illness 66 yo male s/p repeat I&D with removal of previous static abx spacer and reimplantation of articulating  abx spacer (previous TKA and spacer per Dr Gladstone Lighter); PMHx: Hep B & C, drug abuse, chronic pain, bil TKAs, MDD, anxiety  Subjective Data  Patient Stated Goal knee better  Precautions  Precautions Knee;Fall  Precaution Comments ROM as tolerated  Restrictions  RLE Partial Weight Bearing Percentage or Pounds 50%  Pain Assessment  Pain Assessment 0-10  Pain Score 5  Pain Location right (with activity)  Pain Descriptors / Indicators Discomfort;Sore  Pain Intervention(s) Limited activity within patient's tolerance;Monitored during session;Premedicated before session;Ice applied  Cognition  Arousal/Alertness Awake/alert  Behavior During Therapy WFL for tasks assessed/performed  Overall Cognitive Status Within Functional Limits for tasks assessed  Bed Mobility  General bed mobility comments (OOB in recliner)  Total Joint Exercises  Ankle Circles/Pumps AROM;Both;10 reps  Quad Sets AROM;Strengthening;Both;10 reps  Heel Slides AAROM;Right;10 reps  Hip ABduction/ADduction AROM;AAROM;Right;10 reps  Straight Leg Raises AAROM;Right;10 reps;Limitations  Short Arc [employer];Right;10 reps  Straight Leg Raises Limitations rest after 5 reps d/t pain  PT - End of Session  Activity Tolerance Patient tolerated treatment well  Patient left with call bell/phone within reach;in chair;with chair alarm set  PT - Assessment/Plan  PT Plan Current plan remains appropriate  PT Frequency (ACUTE ONLY) 7X/week  Follow Up Recommendations Home health PT  PT equipment None recommended by PT  PT Goal Progression  Progress towards PT goals Progressing toward goals  Acute Rehab PT Goals  PT Goal Formulation With patient  Time For Goal Achievement 04/14/16  Potential to Achieve Goals Good  PT  Time Calculation  PT Start Time (ACUTE ONLY) 1423  PT Stop Time (ACUTE ONLY) 1440  PT Time Calculation (min) (ACUTE ONLY) 17 min  PT General Charges  $$ ACUTE PT VISIT 1 Procedure  PT Treatments  $Therapeutic Exercise 8-22 mins

## 2016-04-08 NOTE — Progress Notes (Signed)
Patient ID: Juan Wiggins, male   DOB: [birth year], 66 y.o.   MRN: [id number]          [facility]    Date of Admission:  04/06/2016           Day 21 linezolid  He was not in his room when I came by this morning. His most recent operative cultures are negative at 24 hours. I did review his outpatient studies with [facility]. His knee was reaspirated on 03/13/2016 and was positive for methicillin sensitive staph aureus again despite multiple long courses of IV and oral antibiotic therapies over the past several months. He did undergo a much more thorough debridement several days ago with placement of a new antibiotic impregnated spacer. I'm hopeful he will be curable following this most recent surgery. It will not be safe to continue linezolid much longer because of the risk for bone marrow suppression and serious neuropathies associated with more prolonged treatment. I will consider switching him to oral doxycycline soon.         Michel Bickers, MD [facility] [phone] pager   [phone] cell 02/26/2015, 1:32 PM

## 2016-04-08 NOTE — Progress Notes (Signed)
     Subjective: 2 Days Post-Op Procedure(s) (LRB): Right knee repeat irrigation and debridement, antibiotic spacer vers right knee reimplantation right total knee (Right)   Patient reports pain as mild, pain controlled.  Patient feels the knee already feels better than it has in a long time.  Discussed the case with Dr. Megan Salon, who is concerned that the patient hasn't had better results with the linezolid.  We have reviewed the outpatient studies from 03/13/2016. Plan is to keep him here to make sure his labs normalize as well as to follow the cultures for any results.   Objective:   VITALS:   Vitals:   04/07/16 2135 04/08/16 0532  BP: (!) 134/52 (!) 115/53  Pulse: 80 82  Resp: 16 16  Temp: 98.4 F (36.9 C) 98.6 F (37 C)    Dorsiflexion/Plantar flexion intact Incision: dressing C/D/I No cellulitis present Compartment soft  LABS  Recent Labs  04/07/16 0450 04/08/16 0433  HGB 7.9* 7.3*  HCT 23.5* 21.8*  WBC 10.3 8.1  PLT 289 259     Recent Labs  04/07/16 0450 04/08/16 0433  NA 137 136  K 5.3* 4.7  BUN 29* 34*  CREATININE 1.92* 1.98*  GLUCOSE 109* 95     Assessment/Plan: 2 Days Post-Op Procedure(s) (LRB): Right knee repeat irrigation and debridement, antibiotic spacer vers right knee reimplantation right total knee (Right)  Appreciate ID input and help treat this patient Up with therapy Discharge home, eventually when ready    [place] Pugh. Ajooni Karam   PAC  04/08/2016, 8:46 AM

## 2016-04-09 DIAGNOSIS — R42 Dizziness and giddiness: Secondary | ICD-10-CM

## 2016-04-09 DIAGNOSIS — R112 Nausea with vomiting, unspecified: Secondary | ICD-10-CM

## 2016-04-09 DIAGNOSIS — B9689 Other specified bacterial agents as the cause of diseases classified elsewhere: Secondary | ICD-10-CM

## 2016-04-09 DIAGNOSIS — T8453XD Infection and inflammatory reaction due to internal right knee prosthesis, subsequent encounter: Secondary | ICD-10-CM

## 2016-04-09 DIAGNOSIS — M00861 Arthritis due to other bacteria, right knee: Secondary | ICD-10-CM

## 2016-04-09 DIAGNOSIS — B1910 Unspecified viral hepatitis B without hepatic coma: Secondary | ICD-10-CM

## 2016-04-09 DIAGNOSIS — Z79899 Other long term (current) drug therapy: Secondary | ICD-10-CM

## 2016-04-09 DIAGNOSIS — B1920 Unspecified viral hepatitis C without hepatic coma: Secondary | ICD-10-CM

## 2016-04-09 LAB — CBC
HCT: 21.9 % — ABNORMAL LOW (ref 39.0–52.0)
Hemoglobin: 7.2 g/dL — ABNORMAL LOW (ref 13.0–17.0)
MCH: 29.5 pg (ref 26.0–34.0)
MCHC: 32.9 g/dL (ref 30.0–36.0)
MCV: 89.8 fL (ref 78.0–100.0)
PLATELETS: 241 10*3/uL (ref 150–400)
RBC: 2.44 MIL/uL — ABNORMAL LOW (ref 4.22–5.81)
RDW: 15.8 % — AB (ref 11.5–15.5)
WBC: 6.8 10*3/uL (ref 4.0–10.5)

## 2016-04-09 LAB — BASIC METABOLIC PANEL
Anion gap: 3 — ABNORMAL LOW (ref 5–15)
BUN: 35 mg/dL — AB (ref 6–20)
CALCIUM: 9.2 mg/dL (ref 8.9–10.3)
CO2: 24 mmol/L (ref 22–32)
CREATININE: 1.82 mg/dL — AB (ref 0.61–1.24)
Chloride: 110 mmol/L (ref 101–111)
GFR calc Af Amer: 44 mL/min — ABNORMAL LOW (ref >60)
GFR, EST NON AFRICAN AMERICAN: 38 mL/min — AB (ref >60)
GLUCOSE: 90 mg/dL (ref 65–99)
POTASSIUM: 4.8 mmol/L (ref 3.5–5.1)
SODIUM: 137 mmol/L (ref 135–145)

## 2016-04-09 LAB — PREPARE RBC (CROSSMATCH)

## 2016-04-09 MED ORDER — POLYETHYLENE GLYCOL 3350 17 G PO PACK: 17.0000 g | PACK | Freq: Two times a day (BID) | ORAL | 0 refills | Status: DC

## 2016-04-09 MED ORDER — ASPIRIN 81 MG PO CHEW: 81.0000 mg | CHEWABLE_TABLET | Freq: Two times a day (BID) | ORAL | 0 refills | Status: AC

## 2016-04-09 MED ORDER — ACETAMINOPHEN 500 MG PO TABS: 1000.0000 mg | ORAL_TABLET | Freq: Three times a day (TID) | ORAL | 0 refills | Status: DC

## 2016-04-09 MED ORDER — DOXYCYCLINE HYCLATE 100 MG PO TABS
100.0000 mg | ORAL_TABLET | Freq: Two times a day (BID) | ORAL | Status: DC
  Administered 2016-04-09 – 2016-04-10 (×2): 100 mg via ORAL
  Filled 2016-04-09 (×2): qty 1

## 2016-04-09 MED ORDER — DOCUSATE SODIUM 100 MG PO CAPS: 100.0000 mg | ORAL_CAPSULE | Freq: Two times a day (BID) | ORAL | 0 refills | Status: DC

## 2016-04-09 MED ORDER — METHOCARBAMOL 500 MG PO TABS: 500.0000 mg | ORAL_TABLET | Freq: Four times a day (QID) | ORAL | 0 refills | Status: DC | PRN

## 2016-04-09 MED ORDER — SODIUM CHLORIDE 0.9 % IV SOLN: Freq: Once | INTRAVENOUS | Status: DC

## 2016-04-09 MED ORDER — OXYCODONE HCL 10 MG PO TABS: 10.0000 mg | ORAL_TABLET | ORAL | 0 refills | Status: DC | PRN

## 2016-04-09 MED ORDER — FERROUS SULFATE 325 (65 FE) MG PO TABS: 325.0000 mg | ORAL_TABLET | Freq: Three times a day (TID) | ORAL | 3 refills | Status: DC

## 2016-04-09 NOTE — Progress Notes (Signed)
1100 patient up with therapy diaphoretic and hr 122, nausea. M Babish  Pa paged.

## 2016-04-09 NOTE — Progress Notes (Addendum)
Patient ID: [name], male   DOB: 21-Mar-1950, 66 y.o.   MRN: [id number]          [facility]ious Disease  Date of Admission:  04/06/2016           Day 22 linezolid  Principal Problem:   Infection of prosthetic right knee joint (HCC) Active Problems:   Staphylococcal arthritis of right knee (HCC)   Acquired absence of knee joint following explantation of joint prosthesis with presence of antibiotic-impregnated cement spacer   S/P total knee arthroplasty   Unintentional weight loss   Chronic hepatitis C without hepatic coma (HCC)   Chronic hepatitis B without delta agent without cirrhosis (HCC)   Hypothyroidism   Major depression   Essential hypertension   GERD (gastroesophageal reflux disease)   Chronic pain syndrome   Normocytic anemia   Seizure (HCC)   Hx of mixed drug abuse   Chronic renal insufficiency, stage 3 (moderate)   Rheumatoid arthritis ([place])   . sodium chloride   Intravenous Once  . acetaminophen  1,000 mg Oral Q8H  . aspirin  81 mg Oral BID  . docusate sodium  100 mg Oral BID  . escitalopram  20 mg Oral Daily  . ferrous sulfate  325 mg Oral TID PC  . OLANZapine  12.5 mg Oral QHS   And  . FLUoxetine  50 mg Oral QHS  . levothyroxine  175 mcg Oral QAC breakfast  . linezolid  600 mg Oral BID  . oxyCODONE  10-20 mg Oral Q4H  . polyethylene glycol  17 g Oral BID  . tamsulosin  0.4 mg Oral QPC breakfast  . tenofovir  300 mg Oral QODAY  . valACYclovir  1,000 mg Oral Daily    SUBJECTIVE: He felt dizzy when standing up today. He had some episodes of nausea and vomiting. He is currently resting in his recliner and receiving 1 unit of blood. He states that his knee is feeling much better than it has a long time. He could tell an immediate difference after recent surgery.  Review of Systems: Review of Systems  Constitutional: Positive for diaphoresis. Negative for chills and fever.  Gastrointestinal: Positive for nausea and vomiting.    Musculoskeletal: Positive for joint pain.       His knee pain is much improved.    Past Medical History:  Diagnosis Date  . Acute renal failure (ARF) ([place]) 2017   TOOK HEMODIALYSIS X 5 WEEKS   . Anxiety   . Arthritis    RHEUMATOID ARTHRITIS; OA LEFT KNEE  . Cancer (HCC)    MELANOMA REMOVED RT SHOULDER  . Depression   . Family history of adverse reaction to anesthesia    MOTHER AND SISTER SLOW TO WAKE UP  . GERD (gastroesophageal reflux disease)    PREVACID IF NEEDED  . Heart murmur   . Hemorrhoids   . Hepatitis A    PT STATES TYPE OF HEPATITIS YOU GET FROM SHELLFISH  . Hepatitis B   . Hepatitis C   . Hypertension    OFF ALL MEDS LAST 2 MONTHS   . Hypothyroidism   . Inguinal hernia    RIGHT  . 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 Pancreatitis, alcoholic, acute [id number]  . Pneumonia 01/02/2016  . Seizures ([place]) 2017   ONCE WHILE IN HOSPITAL NONE SINCE  . Septic arthritis ([place]) 01/14/2016   RT KNEE  . Sleep  apnea    CLAUSTROPHOBIC - COULD NOT TOLERATE CPAP MASK  . Tachycardia    PT STATES HIS HEART RATE USUALLY 100 OR MORE    Social History  Substance Use Topics  . Smoking status: Former Smoker    Packs/day: 1.50    Years: 35.00    Types: Cigars    Quit date: 02/22/1998  . Smokeless tobacco: Never Used     Comment: used cigarettes 20 years ago  . Alcohol use No     Comment: " last alcohol use was in December 2015."    Family History  Problem Relation Age of Onset  . Hypertension Mother   . Other Mother     varicose veins  . Hypertension Sister    Allergies  Allergen Reactions  . Cefazolin Other (See Comments)    Patient had a seizure while on cefazolin and no other core cause could be identified other than the beta lactam  . Heparin Other (See Comments)    Low platelets (130s), no HIT testing performed, platelets recovered.  . Wellbutrin [Bupropion] Other (See Comments)    Hallucinations-started on 150mg   daily dosage    OBJECTIVE: Vitals:   04/09/16 0949 04/09/16 1154 04/09/16 1343 04/09/16 1414  BP:   137/65 (!) 136/58  Pulse:  (!) 128 84 91  Resp:   16 16  Temp:   98.4 F (36.9 C) 98.4 F (36.9 C)  TempSrc:   Oral   SpO2: 100%   100%  Weight:      Height:       Body mass index is 23.01 kg/m.  Physical Exam  Constitutional: He is oriented to person, place, and time. No distress.  Cardiovascular: Normal rate and regular rhythm.   No murmur heard. Pulmonary/Chest: Effort normal and breath sounds normal.  Abdominal: Soft. There is no tenderness.  Musculoskeletal:  Clean dressing on right knee.  Neurological: He is alert and oriented to person, place, and time.  Psychiatric: Mood and affect normal.    Lab Results Lab Results  Component Value Date   WBC 6.8 04/09/2016   HGB 7.2 (L) 04/09/2016   HCT 21.9 (L) 04/09/2016   MCV 89.8 04/09/2016   PLT 241 04/09/2016    Lab Results  Component Value Date   CREATININE 1.82 (H) 04/09/2016   BUN 35 (H) 04/09/2016   NA 137 04/09/2016   K 4.8 04/09/2016   CL 110 04/09/2016   CO2 24 04/09/2016    Lab Results  Component Value Date   ALT 13 (L) 03/31/2016   AST 20 03/31/2016   ALKPHOS 102 03/31/2016   BILITOT 0.3 03/31/2016    Hepatitis C viral load: Nonreactive  Microbiology: Recent Results (from the past 240 hour(s))  Surgical pcr screen     Status: None   Collection Time: 03/31/16 11:30 AM  Result Value Ref Range Status   MRSA, PCR NEGATIVE NEGATIVE Final   Staphylococcus aureus NEGATIVE NEGATIVE Final    Comment:        The Xpert SA Assay (FDA approved for NASAL specimens in patients over 57 years of age), is one component of a comprehensive surveillance program.  Test performance has been validated by [facility] for patients greater than or equal to 56 year old. It is not intended to diagnose infection nor to guide or monitor treatment.   Aerobic/Anaerobic Culture (surgical/deep wound)     Status: None  (Preliminary result)   Collection Time: 04/06/16 11:18 AM  Result Value Ref Range Status  Specimen Description SYNOVIAL RT KNEE  Final   Special Requests NONE  Final   Gram Stain   Final    ABUNDANT WBC PRESENT,BOTH PMN AND MONONUCLEAR NO ORGANISMS SEEN Gram Stain Report Called to,Read Back By and Verified With: HERRSCHAFT,M. RN @1206  ON 2.12.18 BY NMCCOY    Culture   Final    NO GROWTH 3 DAYS Performed at [facility], [place] [address], [place], [place] [address]    Report Status PENDING  Incomplete     ASSESSMENT: His recent operative cultures remain negative. We cannot safely use linezolid long-term and I do not feel that he needs another course of intravenous antibiotics. He has a possible adverse reaction to cephalosporins on his allergy list. I will treat him with oral doxycycline and have him follow-up as previously scheduled with Dr. Baxter Flattery in our clinic on 04/28/2016.  His repeat hepatitis C viral load is undetectable at less than 20. He has now had 3 undetectable viral loads and has completed therapy with Epclusa. It is highly likely that his hepatitis C has been cured. He had a hepatitis B viral load 1 month ago that was undetectable. He will continue chronic tenofovir therapy for his hepatitis B.  PLAN: 1. Change linezolid to doxycycline 100 mg twice a day 2. Follow-up in our clinic on 04/28/2016 3. I will sign off now  Michel Bickers, MD [facility] [phone] pager   [phone] cell 04/09/2016, 3:02 PM

## 2016-04-09 NOTE — Progress Notes (Signed)
pateint up with therapy ambulated feels dizzy hr 72  M babish PA notified with orders received.

## 2016-04-09 NOTE — Progress Notes (Addendum)
Physical Therapy Treatment Patient Details Name: Juan Wiggins MRN: [id number] DOB: 1950/03/31 Today's Date: 04/09/2016    History of Present Illness 66 yo male s/p repeat I&D with removal of previous static abx spacer and reimplantation of articulating  abx spacer (previous TKA and spacer per Dr Gladstone Lighter); PMHx: Hep B & C, drug abuse, chronic pain, bil TKAs, MDD, anxiety    PT Comments    Upon entering room, pt reported feeling very sweaty, onset was 30 min prior. He also reports his neck "feels strange" and he is somewhat nauseous. He denies chest pain. HR 122 with sit to stand, HR 100 at rest. BP supine 133/66, sit 134/67, stand 124/58 with some dizziness in standing. Performed bed to chair transfer and RLE exercises only this morning due to above symptoms. RN aware. Noted Hgb 7.2.    Follow Up Recommendations  Home health PT     Equipment Recommendations  None recommended by PT    Recommendations for Other Services       Precautions / Restrictions Precautions Precautions: Knee;Fall Precaution Comments: ROM as tolerated Required Braces or Orthoses: Knee Immobilizer - Right Knee Immobilizer - Right: Other (comment) (prn) Restrictions Weight Bearing Restrictions: Yes RLE Weight Bearing: Partial weight bearing RLE Partial Weight Bearing Percentage or Pounds: 50%    Mobility  Bed Mobility Overal bed mobility: Modified Independent Bed Mobility: Supine to Sit     Supine to sit: Modified independent (Device/Increase time)        Transfers Overall transfer level: Needs assistance Equipment used: Crutches Transfers: Sit to/from [employer] to Stand: Min guard Stand pivot transfers: Min guard       General transfer comment: min/guard for safety  Ambulation/Gait                 Stairs Pt reports he's very comfortable with stairs, doesn't need to practice them.             Wheelchair Mobility    Modified Rankin (Stroke Patients  Only)       Balance Overall balance assessment: Needs assistance Sitting-balance support: Feet supported;No upper extremity supported Sitting balance-Leahy Scale: Good       Standing balance-Leahy Scale: Poor Standing balance comment: min/guard to maintain safe standing balance without UE support                    Cognition Arousal/Alertness: Awake/alert Behavior During Therapy: WFL for tasks assessed/performed Overall Cognitive Status: Within Functional Limits for tasks assessed                      Exercises Total Joint Exercises Ankle Circles/Pumps: AROM;Both;10 reps Quad Sets: AROM;Strengthening;Both;10 reps Short Arc QuadSinclair Ship;Right;10 reps Heel Slides: AAROM;Right;10 reps -audible clunking with knee flexion noted, pt reports this is baseline Straight Leg Raises: AAROM;Right;10 reps    General Comments        Pertinent Vitals/Pain Pain Score: 5  Pain Location: right knee (with activity) Pain Descriptors / Indicators: Discomfort;Sore Pain Intervention(s): Limited activity within patient's tolerance;Monitored during session;Premedicated before session    Home Living                      Prior Function            PT Goals (current goals can now be found in the care plan section) Acute Rehab PT Goals Patient Stated Goal: knee better PT Goal Formulation: With patient Time For Goal Achievement: 04/14/16 Potential  to Achieve Goals: Good Progress towards PT goals: Not progressing toward goals - comment (elevated HR, pt with nausea/sweating)    Frequency    7X/week      PT Plan Current plan remains appropriate    Co-evaluation             End of Session Equipment Utilized During Treatment: Gait belt Activity Tolerance: Treatment limited secondary to medical complications (Comment) Patient left: with call bell/phone within reach;in chair;with chair alarm set     Time: [phone] PT Time Calculation (min) (ACUTE ONLY):  23 min  Charges:  $Therapeutic Exercise: 8-22 mins $Therapeutic Activity: 8-22 mins                    G Codes:      Philomena Doheny 04/09/2016, 9:55 AM [phone]

## 2016-04-09 NOTE — Progress Notes (Signed)
Physical Therapy Treatment Patient Details Name: Juan Wiggins MRN: [id number] DOB: 1950-09-14 Today's Date: 04/09/2016    History of Present Illness 66 yo male s/p repeat I&D with removal of previous static abx spacer and reimplantation of articulating  abx spacer (previous TKA and spacer per Dr Gladstone Lighter); PMHx: Hep B & C, drug abuse, chronic pain, bil TKAs, MDD, anxiety    PT Comments    Pt reports he vomited 9 times this morning. Pt ambulated 80' +40' with crutches, seated rest break required 2* lightheadedness, HR 128 with walking, 2/4 dyspnea. Pt looks pale. RN notified of above issues.   Follow Up Recommendations  Home health PT     Equipment Recommendations  None recommended by PT    Recommendations for Other Services       Precautions / Restrictions Precautions Precautions: Knee;Fall Precaution Comments: ROM as tolerated Required Braces or Orthoses: Knee Immobilizer - Right Knee Immobilizer - Right: Other (comment) (prn) Restrictions Weight Bearing Restrictions: Yes RLE Weight Bearing: Partial weight bearing RLE Partial Weight Bearing Percentage or Pounds: 50%    Mobility  Bed Mobility Overal bed mobility: Modified Independent Bed Mobility: Supine to Sit     Supine to sit: Modified independent (Device/Increase time)     General bed mobility comments: up in chair  Transfers Overall transfer level: Needs assistance Equipment used: Crutches Transfers: Sit to/from [employer] to Stand: Min guard Stand pivot transfers: Min guard       General transfer comment: min/guard for safety  Ambulation/Gait   Ambulation Distance (Feet): 80 Feet (80' + 40' with seated rest 2* dizziness) Assistive device: Crutches Gait Pattern/deviations: Step-to pattern;Decreased weight shift to right   Gait velocity interpretation: Below normal speed for age/gender General Gait Details: cues for sequence, PWB, 80' + 40' with seated rest break 2*  lightheadedness, HR 128 with walking   Stairs            Wheelchair Mobility    Modified Rankin (Stroke Patients Only)       Balance Overall balance assessment: Needs assistance Sitting-balance support: Feet supported;No upper extremity supported Sitting balance-Leahy Scale: Good       Standing balance-Leahy Scale: Fair Standing balance comment: steady with B crutches                    Cognition Arousal/Alertness: Awake/alert Behavior During Therapy: WFL for tasks assessed/performed Overall Cognitive Status: Within Functional Limits for tasks assessed                      Exercises Total Joint Exercises Ankle Circles/Pumps: AROM;Both;10 reps Quad Sets: AROM;Strengthening;Both;10 reps Short Arc QuadSinclair Ship;Right;10 reps Heel Slides: AAROM;Right;10 reps (audible clunking noted with knee flexion, pt reports this is baseline) Straight Leg Raises: AAROM;Right;10 reps    General Comments        Pertinent Vitals/Pain Pain Score: 5  Pain Location: right knee (with activity) Pain Descriptors / Indicators: Discomfort;Sore Pain Intervention(s): Limited activity within patient's tolerance;Monitored during session;Premedicated before session    Home Living                      Prior Function            PT Goals (current goals can now be found in the care plan section) Acute Rehab PT Goals Patient Stated Goal: golf PT Goal Formulation: With patient Time For Goal Achievement: 04/14/16 Potential to Achieve Goals: Good Progress towards PT goals: Progressing toward  goals    Frequency    7X/week      PT Plan Current plan remains appropriate    Co-evaluation             End of Session Equipment Utilized During Treatment: Gait belt Activity Tolerance: Treatment limited secondary to medical complications (Comment) Patient left: with call bell/phone within reach;in chair;with chair alarm set     Time: [id number] PT Time  Calculation (min) (ACUTE ONLY): 17 min  Charges:  $Gait Training: 8-22 mins $Therapeutic Exercise: 8-22 mins $Therapeutic Activity: 8-22 mins                    G Codes:      Philomena Doheny 04/09/2016, 11:58 AM [phone]

## 2016-04-09 NOTE — Progress Notes (Signed)
     Subjective: 3 Days Post-Op Procedure(s) (LRB): Right knee repeat irrigation and debridement, antibiotic spacer vers right knee reimplantation right total knee (Right)   Patient reports pain as mild, pain controlled. States that yesterday it felt better than it has in a long time and it feels better today as compared to yesterday.  No events throughout the night.  Understands we are awaiting any change in medication from ID.  So far it doesn't appear to be any growth from the cultures after 2 days.  Patient understands, but is looking forward to getting home.  Later in the day he was getting dizzy and increase HR.  Objective:   VITALS:   Vitals:   04/08/16 2126 04/09/16 0604  BP: 133/75 134/72  Pulse: 79 79  Resp: 16 16  Temp: 98.4 F (36.9 C) 97.5 F (36.4 C)    Dorsiflexion/Plantar flexion intact Incision: scant drainage No cellulitis present Compartment soft  LABS  Recent Labs  04/07/16 0450 04/08/16 0433 04/09/16 0421  HGB 7.9* 7.3* 7.2*  HCT 23.5* 21.8* 21.9*  WBC 10.3 8.1 6.8  PLT 289 259 241     Recent Labs  04/07/16 0450 04/08/16 0433 04/09/16 0421  NA 137 136 137  K 5.3* 4.7 4.8  BUN 29* 34* 35*  CREATININE 1.92* 1.98* 1.82*  GLUCOSE 109* 95 90     Assessment/Plan: 3 Days Post-Op Procedure(s) (LRB): Right knee repeat irrigation and debridement, antibiotic spacer vers right knee reimplantation right total knee (Right) Up with therapy Discharge home eventually, when ready  ABLA  Treated with 2 units of blood Continue with iron and will observe        [place]. [place] Merriott   PAC  04/09/2016, 8:38 AM

## 2016-04-10 ENCOUNTER — Other Ambulatory Visit: Payer: Self-pay

## 2016-04-10 ENCOUNTER — Other Ambulatory Visit (HOSPITAL_COMMUNITY): Payer: Self-pay

## 2016-04-10 LAB — TYPE AND SCREEN
BLOOD PRODUCT EXPIRATION DATE: 201803142359
BLOOD PRODUCT EXPIRATION DATE: 201803172359
ISSUE DATE / TIME: 201802151701
ISSUE DATE / TIME: 201802151343
UNIT TYPE AND RH: 600
Unit Type and Rh: 600

## 2016-04-10 LAB — CBC
HCT: 28.8 % — ABNORMAL LOW (ref 39.0–52.0)
Hemoglobin: 9.6 g/dL — ABNORMAL LOW (ref 13.0–17.0)
MCH: 29.7 pg (ref 26.0–34.0)
MCHC: 33.3 g/dL (ref 30.0–36.0)
MCV: 89.2 fL (ref 78.0–100.0)
PLATELETS: 216 10*3/uL (ref 150–400)
RBC: 3.23 MIL/uL — ABNORMAL LOW (ref 4.22–5.81)
RDW: 15.1 % (ref 11.5–15.5)
WBC: 6.8 10*3/uL (ref 4.0–10.5)

## 2016-04-10 LAB — BASIC METABOLIC PANEL
Anion gap: 3 — ABNORMAL LOW (ref 5–15)
BUN: 35 mg/dL — AB (ref 6–20)
CHLORIDE: 110 mmol/L (ref 101–111)
CO2: 25 mmol/L (ref 22–32)
CREATININE: 1.86 mg/dL — AB (ref 0.61–1.24)
Calcium: 9.1 mg/dL (ref 8.9–10.3)
GFR calc Af Amer: 43 mL/min — ABNORMAL LOW (ref >60)
GFR, EST NON AFRICAN AMERICAN: 37 mL/min — AB (ref >60)
GLUCOSE: 90 mg/dL (ref 65–99)
Potassium: 4.7 mmol/L (ref 3.5–5.1)
SODIUM: 138 mmol/L (ref 135–145)

## 2016-04-10 MED ORDER — TENOFOVIR DISOPROXIL FUMARATE 300 MG PO TABS: 300.0000 mg | ORAL_TABLET | Freq: Every day | ORAL | 6 refills | Status: DC

## 2016-04-10 MED ORDER — BUPROPION HCL ER (XL) 150 MG PO TB24: 150.0000 mg | ORAL_TABLET | ORAL | 0 refills | Status: DC

## 2016-04-10 MED ORDER — ZOLPIDEM TARTRATE 10 MG PO TABS: ORAL_TABLET | ORAL | 2 refills | Status: DC

## 2016-04-10 MED ORDER — DOXYCYCLINE HYCLATE 100 MG PO TABS: 100.0000 mg | ORAL_TABLET | Freq: Two times a day (BID) | ORAL | 1 refills | Status: DC

## 2016-04-10 MED ORDER — ESCITALOPRAM OXALATE 20 MG PO TABS
20.0000 mg | ORAL_TABLET | Freq: Every day | ORAL | 0 refills | Status: DC
Start: 2016-04-10 — End: 2016-05-11

## 2016-04-10 NOTE — Progress Notes (Signed)
RN reviewed discharge instructions with patient. All questions answered.   Paperwork and prescriptions given.   NT rolled patient down with all belongings to family car.  

## 2016-04-10 NOTE — Telephone Encounter (Signed)
Patient requesting script for Viread be sent to [name] . Medication sent to pharmacy.

## 2016-04-10 NOTE — Progress Notes (Signed)
Physical Therapy Treatment Patient Details Name: Juan Wiggins MRN: [id number] DOB: [birth year] Today's Date: 04/10/2016    History of Present Illness 66 yo male s/p repeat I&D with removal of previous static abx spacer and reimplantation of articulating  abx spacer (previous TKA and spacer per Dr Juan Wiggins); PMHx: Hep B & C, drug abuse, chronic pain, bil TKAs, MDD, anxiety    PT Comments    Pt's color is much improved following transfusion of 2 units of blood yesterday. He reports no sweating, nor dizziness today. He ambulated 140' independently with crutches, HR 100 with walking. Reviewed HEP. From PT standpoint he is ready to DC home.    Follow Up Recommendations  Home health PT     Equipment Recommendations  None recommended by PT    Recommendations for Other Services       Precautions / Restrictions Precautions Precautions: Knee;Fall Precaution Comments: ROM as tolerated Required Braces or Orthoses: Knee Immobilizer - Right Knee Immobilizer - Right: Other (comment) (prn) Restrictions Weight Bearing Restrictions: Yes RLE Weight Bearing: Partial weight bearing RLE Partial Weight Bearing Percentage or Pounds: 50%    Mobility  Bed Mobility Overal bed mobility: Modified Independent Bed Mobility: Supine to Sit     Supine to sit: Modified independent (Device/Increase time)        Transfers Overall transfer level: Modified independent Equipment used: Crutches Transfers: Sit to/from [employer] to Stand: Modified independent (Device/Increase time)            Ambulation/Gait Ambulation/Gait assistance: Modified independent (Device/Increase time) Ambulation Distance (Feet): 140 Feet Assistive device: Crutches Gait Pattern/deviations: Step-to pattern;Decreased weight shift to right     General Gait Details: steady with crutches, HR 100 with ambulation, no dizziness   Stairs Stairs:  (pt declined stair training, stated he's independent with  stairs using crutches)          Wheelchair Mobility    Modified Rankin (Stroke Patients Only)       Balance Overall balance assessment: Needs assistance Sitting-balance support: Feet supported;No upper extremity supported Sitting balance-Leahy Scale: Good       Standing balance-Leahy Scale: Fair Standing balance comment: steady with B crutches                    Cognition Arousal/Alertness: Awake/alert Behavior During Therapy: WFL for tasks assessed/performed Overall Cognitive Status: Within Functional Limits for tasks assessed                      Exercises Total Joint Exercises Quad Sets: AROM;Strengthening;Both;10 reps Short Arc QuadSinclair Ship;Right;10 reps;Supine Heel Slides: AAROM;Right;10 reps;Supine (audible clunking noted with knee flexion, pt reports this is baseline) Straight Leg Raises: AAROM;Right;10 reps;Supine Long Arc Quad: AAROM;Right;10 reps;Seated    General Comments        Pertinent Vitals/Pain Pain Score: 5  Pain Location: right knee (with activity) Pain Intervention(s): Limited activity within patient's tolerance;Monitored during session;Premedicated before session    Home Living                      Prior Function            PT Goals (current goals can now be found in the care plan section) Acute Rehab PT Goals Patient Stated Goal: golf PT Goal Formulation: With patient Time For Goal Achievement: 04/14/16 Potential to Achieve Goals: Good Progress towards PT goals: Goals met/education completed, patient discharged from PT    Frequency    7X/week  PT Plan Current plan remains appropriate    Co-evaluation             End of Session Equipment Utilized During Treatment: Gait belt Activity Tolerance: Patient tolerated treatment well Patient left: in bed     Time: 7412-8786 PT Time Calculation (min) (ACUTE ONLY): 18 min  Charges:  $Gait Training: 8-22 mins                    G Codes:       Philomena Doheny 04/10/2016, 9:44 AM [phone]

## 2016-04-10 NOTE — Progress Notes (Signed)
     Subjective: 4 Days Post-Op Procedure(s) (LRB): Right knee repeat irrigation and debridement, antibiotic spacer vers right knee reimplantation right total knee (Right)   Patient reports pain as mild, pain controlled.  No events throughout the night. Already feels better after receiving 2 units of blood yesterday.  States that the knee continually feels better than it did prior to this last surgery.  Objective:   VITALS:   Vitals:   04/09/16 2125 04/10/16 0641  BP: (!) 144/68 (!) 143/70  Pulse: 76 68  Resp: 16 16  Temp: 98.7 F (37.1 C) 98.1 F (36.7 C)    Dorsiflexion/Plantar flexion intact Incision: dressing C/D/I No cellulitis present Compartment soft  LABS  Recent Labs  04/08/16 0433 04/09/16 0421 04/10/16 0420  HGB 7.3* 7.2* 9.6*  HCT 21.8* 21.9* 28.8*  WBC 8.1 6.8 6.8  PLT 259 241 216     Recent Labs  04/08/16 0433 04/09/16 0421 04/10/16 0420  NA 136 137 138  K 4.7 4.8 4.7  BUN 34* 35* 35*  CREATININE 1.98* 1.82* 1.86*  GLUCOSE 95 90 90     Assessment/Plan: 4 Days Post-Op Procedure(s) (LRB): Right knee repeat irrigation and debridement, antibiotic spacer vers right knee reimplantation right total knee (Right) Up with therapy Discharge home Follow up in 2 weeks at [facility]. Follow up with OLIN,Aybree Lanyon D in 2 weeks.  Contact information:  [facility] [address] [place] Paislynn Hegstrom   PAC  04/10/2016, 8:15 AM

## 2016-04-11 LAB — AEROBIC/ANAEROBIC CULTURE W GRAM STAIN (SURGICAL/DEEP WOUND)

## 2016-04-11 LAB — AEROBIC/ANAEROBIC CULTURE (SURGICAL/DEEP WOUND): CULTURE: NO GROWTH

## 2016-04-13 NOTE — Discharge Summary (Signed)
Physician Discharge Summary  Patient ID: Juan Wiggins MRN: [id number] DOB/AGE: April 27, 1950 66 y.o.  Admit date: 04/06/2016 Discharge date: 04/10/2016   Procedures:  Procedure(s) (LRB): Right knee repeat irrigation and debridement, antibiotic spacer vers right knee reimplantation right total knee (Right)  Attending Physician:  Dr. Paralee Cancel   Admission Diagnoses:   Removal of antibiotic spacer, irrigation and debridement and placement of another antibiotic spacer, right knee.  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 Acute renal failure (ARF) ([place]) 2017   TOOK HEMODIALYSIS X 5 WEEKS   . Anxiety   . Arthritis    RHEUMATOID ARTHRITIS; OA LEFT KNEE  . Cancer (HCC)    MELANOMA REMOVED RT SHOULDER  . Depression   . Family history of adverse reaction to anesthesia    MOTHER AND SISTER SLOW TO WAKE UP  . GERD (gastroesophageal reflux disease)    PREVACID IF NEEDED  . Heart murmur   . Hemorrhoids   . Hepatitis A    PT STATES TYPE OF HEPATITIS YOU GET FROM SHELLFISH  . Hepatitis B   . Hepatitis C   . Hypertension    OFF ALL MEDS LAST 2 MONTHS   . Hypothyroidism   . Inguinal hernia    RIGHT  . Lower back pain    TOLD SCIATIC NERVE PINCHED - MAY NEED  SURGERY IN FUTURE  . Pancreatitis, alcoholic, acute [id number]  . Pneumonia 01/02/2016  . Seizures ([place]) 2017   ONCE WHILE IN HOSPITAL NONE SINCE  . Septic arthritis ([place]) 01/14/2016   RT KNEE  . Sleep apnea    CLAUSTROPHOBIC - COULD NOT TOLERATE CPAP MASK  . Tachycardia    PT STATES HIS HEART RATE USUALLY 100 OR MORE    HPI:    Pt is a 66 y.o. male complaining of right knee pain / swelling / infection. Patient had a previous removal of TKA, I&D and placement of antibiotic spacer on 01/21/2016.  Since then he has been using the IV antibiotics.   Upon return to the clinic Synovasure was performed which revealed a positive alpha defensin concerning for persistent infection. At this point, he has a scheduled surgery. His surgery that will be per performed will be a repeat I and D as well as placement of a new antibiotic spacer. At this point Dr. Alvan Dame does not feel it safe to proceed with total knee reimplantation   Various options are discussed with the patient. Risks, benefits and expectations were discussed with the patient. Patient understand the risks, benefits and expectations and wishes to proceed with surgery.   PCP: Helane Rima, MD   Discharged Condition: good  Hospital Course:  Patient underwent the above stated procedure on 04/06/2016. Patient tolerated the procedure well and brought to the recovery  room in good condition and subsequently to the floor.  POD #1 BP: 106/91 ; Pulse: 91 ; Temp: 98.7 F (37.1 C) ; Resp: 16 Patient reports pain as moderate but well controlled.  He expresses that he feels pretty good at this point compared to previous surgeries. Neurovascular intact Incision: dressing C/D/I in knee immobilizer  LABS  Basename    HGB     7.9  HCT     23.5   POD #2  BP: 115/53 ; Pulse: 82 ; Temp: 98.6 F (37 C) ; Resp: 16 Patient reports pain as mild, pain controlled.  Patient feels the knee already feels better than it has in a long time.  Discussed the case with  Dr. Megan Salon, who is concerned that the patient hasn't had better results with the linezolid.  We have reviewed the outpatient studies from 03/13/2016. Plan is to keep him here to make sure his labs normalize as well as to follow the cultures for any results. Dorsiflexion/plantar flexion intact, incision: dressing C/D/I, no cellulitis present and compartment soft.   LABS  Basename    HGB     7.3  HCT     21.8   POD #3  BP: 134/72 ; Pulse: 79 ; Temp: 97.5 F (36.4 C) ; Resp: 16 Patient reports pain as mild, pain controlled. States that yesterday it felt better than it has in a long time and it feels better today as compared to yesterday.  No events throughout the night.  Understands we are awaiting any change in medication from ID.  So far it doesn't appear to be any growth from the cultures after 2 days.  Patient understands, but is looking forward to getting home.  Later in the day he was getting dizzy and increase HR.  LABS  Basename    HGB     7.2  HCT     21.9   POD #4  BP: 143/70 ; Pulse: 68 ; Temp: 98.1 F (36.7 C) ; Resp: 16 Patient reports pain as mild, pain controlled.  No events throughout the night. Already feels better after receiving 2 units of blood yesterday.  States that the knee continually feels better than it did prior to this last surgery. Dorsiflexion/plantar flexion intact, incision: dressing C/D/I, no cellulitis present and compartment soft.   LABS  Basename    HGB     9.6  HCT     28.8    Discharge Exam: General appearance: alert, cooperative and no distress Extremities: Homans sign is negative, no sign of DVT, no edema, redness or tenderness in the calves or thighs and no ulcers, gangrene or trophic changes  Disposition: Home with follow up in 2 weeks   Follow-up Information    Mauri Pole, MD. Schedule an appointment as soon as possible for a visit in 2 week(s).   Specialty:  Orthopedic Surgery Contact information: [address] [place] [address] [phone]        [facility]             . Schedule an appointment as soon as possible for a visit on 04/28/2016.   Contact information: [address] [place] [phone]          Discharge Instructions    Call MD / Call 911    Complete by:  As directed    If you experience chest pain or shortness of breath, CALL 911 and be transported  to the hospital emergency room.  If you develope a fever above 101 F, pus (white drainage) or increased drainage or redness at the wound, or calf pain, call your surgeon's office.   Change dressing    Complete by:  As directed    Maintain surgical dressing until follow up in the clinic. If the edges start to pull up, may reinforce with tape. If the dressing is no longer working, may remove and cover with gauze and tape, but must keep the area dry and clean.  Call with any questions or concerns.   Constipation Prevention    Complete by:  As directed    Drink plenty of fluids.  Prune juice may be helpful.  You may use a stool softener, such as Colace (over the counter) 100 mg twice a day.  Use MiraLax (over the counter) for constipation as needed.   Diet - low sodium heart healthy    Complete by:  As directed    Discharge instructions    Complete by:  As directed    Maintain surgical dressing until follow up in the clinic. If the edges start to pull up, may reinforce with tape. If the dressing is no longer working, may remove and cover with gauze and tape, but must keep the area dry and clean.  Follow up in 2 weeks at [facility]. Call with any questions or concerns.   Partial weight bearing    Complete by:  As directed    % Body Weight:  50   Laterality:  right   Extremity:  Lower   TED hose    Complete by:  As directed    Use stockings (TED hose) for 2 weeks on both leg(s).  You may remove them at night for sleeping.      Allergies as of 04/10/2016       Reactions   Cefazolin Other (See Comments)   Patient had a seizure while on cefazolin and no other core cause could be identified other than the beta lactam   Heparin Other (See Comments)   Low platelets (130s), no HIT testing performed, platelets recovered.   Wellbutrin [bupropion] Other (See Comments)   Hallucinations-started on 150mg  daily dosage      Medication List    STOP taking these medications   linezolid 600 MG tablet Commonly known as:  ZYVOX     TAKE these medications   acetaminophen 500 MG tablet Commonly known as:  TYLENOL Take 2 tablets (1,000 mg total) by mouth every 8 (eight) hours.   albuterol 108 (90 Base) MCG/ACT inhaler Commonly known as:  PROVENTIL HFA;VENTOLIN HFA Inhale 2-4 puffs into the lungs daily as needed for shortness of breath.   aspirin 81 MG chewable tablet Chew 1 tablet (81 mg total) by mouth 2 (two) times daily.   Cholecalciferol 2000 units Caps Take 6,000 Units by mouth daily.   docusate sodium 100 MG capsule Commonly known as:  COLACE Take 1 capsule (100 mg total) by mouth 2 (two) times daily.   doxycycline 100 MG tablet Commonly known as:  VIBRA-TABS Take 1 tablet (100 mg total) by mouth every 12 (twelve) hours.   ferrous sulfate 325 (65 FE) MG tablet Take 1 tablet (325 mg total) by mouth 3 (three) times daily after meals.   levothyroxine 200 MCG tablet Commonly known as:  SYNTHROID, LEVOTHROID Take 225 mcg by mouth daily before breakfast. Pt takes with 25 mcg   levothyroxine 175 MCG tablet Commonly known as:  SYNTHROID,  LEVOTHROID Take 175 mcg by mouth daily before breakfast.   levothyroxine 25 MCG tablet Commonly known as:  SYNTHROID, LEVOTHROID Take 225 mcg by mouth daily. Pt takes with 200 mcg   methocarbamol 500 MG tablet Commonly known as:  ROBAXIN Take 1 tablet (500 mg total) by mouth every 6 (six) hours as needed for muscle spasms.   OLANZapine-FLUoxetine 12-50 MG capsule Commonly known as:  SYMBYAX Take 1  capsule by mouth at bedtime.   ondansetron 4 MG tablet Commonly known as:  ZOFRAN TAKE 1 TABLET BY MOUTH EVERY 8 HOURS AS NEEDED FOR NAUSEA OR VOMITING.   Oxycodone HCl 10 MG Tabs Take 1-2 tablets (10-20 mg total) by mouth every 4 (four) hours as needed for severe pain. What changed:  how much to take  when to take this  reasons to take this   polyethylene glycol packet Commonly known as:  MIRALAX / GLYCOLAX Take 17 g by mouth 2 (two) times daily.   promethazine 25 MG tablet Commonly known as:  PHENERGAN Take 25 mg by mouth every 6 (six) hours as needed for nausea or vomiting.   RAPAFLO 8 MG Caps capsule Generic drug:  silodosin Take 8 mg by mouth daily with breakfast.   valACYclovir 1000 MG tablet Commonly known as:  VALTREX Take 1,000 mg by mouth See admin instructions. Take 1 tablet by mouth daily for flares - until one day past outbreak        Signed: [place] P[place]. Jimi Schappert   PA-C  04/13/2016, 9:55 AM

## 2016-04-20 ENCOUNTER — Other Ambulatory Visit (HOSPITAL_COMMUNITY): Payer: Self-pay | Admitting: Orthopedic Surgery

## 2016-04-20 ENCOUNTER — Ambulatory Visit (HOSPITAL_COMMUNITY)
Admission: RE | Admit: 2016-04-20 | Discharge: 2016-04-20 | Disposition: A | Discharge status: Home / Self Care | Payer: 59 | Source: Ambulatory Visit | Attending: Orthopedic Surgery | Admitting: Orthopedic Surgery

## 2016-04-20 DIAGNOSIS — M7989 Other specified soft tissue disorders: Principal | ICD-10-CM

## 2016-04-20 DIAGNOSIS — M79661 Pain in right lower leg: Secondary | ICD-10-CM

## 2016-04-20 NOTE — Progress Notes (Signed)
*  Preliminary Results* Right lower extremity venous duplex completed. Right lower extremity is negative for deep vein thrombosis. There is no evidence of right Baker's cyst.  04/20/2016 1:42 PM  Maudry Mayhew, BS, RVT, RDCS, RDMS

## 2016-04-28 ENCOUNTER — Encounter: Payer: Self-pay | Admitting: Internal Medicine

## 2016-04-28 ENCOUNTER — Ambulatory Visit (INDEPENDENT_AMBULATORY_CARE_PROVIDER_SITE_OTHER): Payer: 59 | Admitting: Internal Medicine

## 2016-04-28 VITALS — BP 154/74 | HR 83 | Temp 98.0°F | Ht 71.0 in | Wt 172.0 lb

## 2016-04-28 DIAGNOSIS — T8450XS Infection and inflammatory reaction due to unspecified internal joint prosthesis, sequela: Secondary | ICD-10-CM

## 2016-04-28 DIAGNOSIS — B182 Chronic viral hepatitis C: Secondary | ICD-10-CM

## 2016-04-28 DIAGNOSIS — N183 Chronic kidney disease, stage 3 unspecified: Secondary | ICD-10-CM

## 2016-04-28 DIAGNOSIS — B180 Chronic viral hepatitis B with delta-agent: Secondary | ICD-10-CM | POA: Diagnosis not present

## 2016-04-28 DIAGNOSIS — A498 Other bacterial infections of unspecified site: Secondary | ICD-10-CM | POA: Diagnosis not present

## 2016-04-28 DIAGNOSIS — M01X0 Direct infection of unspecified joint in infectious and parasitic diseases classified elsewhere: Secondary | ICD-10-CM

## 2016-04-28 DIAGNOSIS — M0080 Arthritis due to other bacteria, unspecified joint: Secondary | ICD-10-CM

## 2016-04-28 DIAGNOSIS — A4901 Methicillin susceptible Staphylococcus aureus infection, unspecified site: Secondary | ICD-10-CM

## 2016-04-28 MED ORDER — ONDANSETRON HCL 4 MG PO TABS: ORAL_TABLET | ORAL | 1 refills | Status: DC

## 2016-04-28 NOTE — Progress Notes (Signed)
RFV: follow up for mssa pji  Patient ID: Juan Wiggins, male   DOB: [birth year], 66 y.o.   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  He was found to have recurrent infection (aspirate from 1/19 + MSSA of right knee) following resection athroplasty, placement of antibiotic spacer and IV/oral abtx. On 2/12 he underwent removal of implant and repeat excision and non-excisional debridement of right knee with new abtx spacer. His cx from the 2/12 OR specimen were negative growth. He was discharged on doxy . He has noticed having nausea. He has 1 wk left of his abtx then sees ortho, roughly 2 wk later off of abtx to evaluated for new knee implant.  Outpatient Encounter Prescriptions as of 04/28/2016  Medication Sig  . acetaminophen (TYLENOL) 500 MG tablet Take 2 tablets (1,000 mg total) by mouth every 8 (eight) hours.  Marland Kitchen albuterol (PROVENTIL HFA;VENTOLIN HFA) 108 (90 Base) MCG/ACT inhaler Inhale 2-4 puffs into the lungs daily as needed for shortness of breath.   Marland Kitchen aspirin 81 MG chewable tablet Chew 1 tablet (81 mg total) by mouth 2 (two) times daily.  . Cholecalciferol 2000 units CAPS Take 6,000 Units by mouth daily.   Marland Kitchen docusate sodium (COLACE) 100 MG capsule Take 1 capsule (100 mg total) by mouth 2 (two) times daily.  Marland Kitchen doxycycline (VIBRA-TABS) 100 MG tablet Take 1 tablet (100 mg total) by mouth every 12 (twelve) hours.  Marland Kitchen escitalopram (LEXAPRO) 20 MG tablet Take 1 tablet (20 mg total) by mouth daily.  Marland Kitchen levothyroxine (SYNTHROID, LEVOTHROID) 175 MCG tablet Take 175 mcg by mouth daily before breakfast.  . methocarbamol (ROBAXIN) 500 MG tablet Take 1 tablet (500 mg total) by mouth every 6 (six) hours as needed for muscle spasms.  Marland Kitchen OLANZapine-FLUoxetine (SYMBYAX) 12-50 MG capsule Take 1 capsule by mouth at bedtime.  . ondansetron (ZOFRAN) 4 MG tablet TAKE 1 TABLET BY MOUTH EVERY 8 HOURS AS NEEDED FOR NAUSEA OR VOMITING.  Marland Kitchen oxyCODONE 10 MG TABS Take 1-2 tablets (10-20 mg total) by  mouth every 4 (four) hours as needed for severe pain.  . polyethylene glycol (MIRALAX / GLYCOLAX) packet Take 17 g by mouth 2 (two) times daily.  . promethazine (PHENERGAN) 25 MG tablet Take 25 mg by mouth every 6 (six) hours as needed for nausea or vomiting.  Marland Kitchen RAPAFLO 8 MG CAPS capsule Take 8 mg by mouth daily with breakfast.   . tenofovir (VIREAD) 300 MG tablet Take 1 tablet (300 mg total) by mouth daily.  . valACYclovir (VALTREX) 1000 MG tablet Take 1,000 mg by mouth See admin instructions. Take 1 tablet by mouth daily for flares - until one day past outbreak  . zolpidem (AMBIEN) 10 MG tablet Take 1/2 to 1 tablet by mouth as needed at bedtime  . buPROPion (WELLBUTRIN XL) 150 MG 24 hr tablet Take 1 tablet (150 mg total) by mouth every morning. (Patient not taking: Reported on 04/28/2016)  . ferrous sulfate 325 (65 FE) MG tablet Take 1 tablet (325 mg total) by mouth 3 (three) times daily after meals. (Patient not taking: Reported on 04/28/2016)  . testosterone cypionate (DEPOTESTOSTERONE CYPIONATE) 200 MG/ML injection   . [DISCONTINUED] levothyroxine (SYNTHROID, LEVOTHROID) 200 MCG tablet Take 225 mcg by mouth daily before breakfast. Pt takes with 25 mcg  . [DISCONTINUED] levothyroxine (SYNTHROID, LEVOTHROID) 25 MCG tablet Take 225 mcg by mouth daily. Pt takes with 200 mcg   No facility-administered encounter medications on file as of  04/28/2016.      Patient Active Problem List   Diagnosis Date Noted  . Acquired absence of knee joint following explantation of joint prosthesis with presence of antibiotic-impregnated cement spacer 04/06/2016  . S/P total knee arthroplasty 04/06/2016  . Infection of prosthetic right knee joint ([place]) 04/06/2016  . Chronic renal insufficiency, stage 3 (moderate) 04/06/2016  . Rheumatoid arthritis ([place]) 04/06/2016  . Unintentional weight loss 04/06/2016  . Obstructive sleep apnea 02/11/2016  . Hx of mixed drug abuse 01/29/2016  . Seizure ([place])   . IVDU (intravenous  drug user)   . Staphylococcal arthritis of right knee ([place])   . 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 Chronic hepatitis C without hepatic coma ([place]) 07/18/2015  . Normocytic anemia 07/18/2015  . Opacity of lung on imaging study, bilateral 07/18/2015  . Anxiety state 07/18/2015  . Chronic pain syndrome 05/21/2014  . Essential hypertension 08/28/2013  . GERD (gastroesophageal reflux disease) 08/28/2013  . Osteoarthritis of left knee 08/23/2013  . Alcohol dependence ([place]) 07/14/2013  . Major depression 07/07/2013  . Hypothyroidism 04/13/2011     Health Maintenance Due  Topic Date Due  . COLONOSCOPY  06/21/2002     Review of Systems Review of Systems  Constitutional: Negative for fever, chills, diaphoresis, activity change, appetite change, fatigue and unexpected weight change.  HENT: Negative for congestion, sore throat, rhinorrhea, sneezing, trouble swallowing and sinus pressure.  Eyes: Negative for photophobia and visual disturbance.  Respiratory: Negative for cough, chest tightness, shortness of breath, wheezing and stridor.  Cardiovascular: Negative for chest pain, palpitations and leg swelling.  Gastrointestinal: Negative for nausea, vomiting, abdominal pain, diarrhea, constipation, blood in stool, abdominal distention and anal bleeding.  Genitourinary: Negative for dysuria, hematuria, flank pain and difficulty urinating.  Musculoskeletal: limited right knee range of motion while spacer is in place Skin: Negative for color change, pallor, rash and wound.  Neurological: Negative for dizziness, tremors, weakness and light-headedness.  Hematological: Negative for adenopathy. Does not bruise/bleed easily.  Psychiatric/Behavioral: Negative for behavioral problems, confusion, sleep disturbance, dysphoric mood, decreased concentration and agitation.    Physical Exam   BP (!) 154/74   Pulse 83   Temp 98 F (36.7 C) (Oral)   Ht 5\' 11"  (1.803 m)   Wt  172 lb (78 kg)   BMI 23.99 kg/m    Physical Exam  Constitutional: He is oriented to person, place, and time. He appears well-developed and well-nourished. No distress.  HENT:  Mouth/Throat: Oropharynx is clear and moist. No oropharyngeal exudate.  Cardiovascular: Normal rate, regular rhythm and normal heart sounds. Exam reveals no gallop and no friction rub.  No murmur heard.  Pulmonary/Chest: Effort normal and breath sounds normal. No respiratory distress. He has no wheezes.  Ext: right knee surgical incision site is healing well. Slight warmth and swelling Neurological: He is alert and oriented to person, place, and time.  Skin: Skin is warm and dry. No rash noted. No erythema.  Psychiatric: He has a normal mood and affect. His behavior is normal.    Lab Results  Component Value Date   HEPBSAB NEG 09/03/2015   Lab Results  Component Value Date   LABRPR NON REACTIVE 04/14/2011    CBC Lab Results  Component Value Date   WBC 6.8 04/10/2016   RBC 3.23 (L) 04/10/2016   HGB 9.6 (L) 04/10/2016   HCT 28.8 (L) 04/10/2016   PLT 216 04/10/2016   MCV 89.2 04/10/2016   MCH 29.7 04/10/2016  MCHC 33.3 04/10/2016   RDW 15.1 04/10/2016   LYMPHSABS 2,250 03/26/2016   MONOABS 630 03/26/2016   EOSABS 360 03/26/2016    BMET Lab Results  Component Value Date   NA 138 04/10/2016   K 4.7 04/10/2016   CL 110 04/10/2016   CO2 25 04/10/2016   GLUCOSE 90 04/10/2016   BUN 35 (H) 04/10/2016   CREATININE 1.86 (H) 04/10/2016   CALCIUM 9.1 04/10/2016   GFRNONAA 37 (L) 04/10/2016   GFRAA 43 (L) 04/10/2016    Lab Results  Component Value Date   ESRSEDRATE 60 (H) 03/10/2016   Lab Results  Component Value Date   CRP 126.6 (H) 03/10/2016   Lab Results  Component Value Date   ESRSEDRATE 11 04/28/2016   Lab Results  Component Value Date   CRP 11.3 (H) 04/28/2016    Assessment and Plan  pji = will plan to continue on last week of doxycycline. Will check his inflammatory  markers.  Addendum: his inflammatory markers have improved considerably since his last continue  abtx associated nausea- will refill his zofran  Chronic Hep c without hepatic coma = treated will need to check svr 12  Chronic hep b = continue on viread daily  ckd 3= continue to monitor cr

## 2016-04-29 LAB — SEDIMENTATION RATE: Sed Rate: 11 mm/hr (ref 0–20)

## 2016-04-29 LAB — C-REACTIVE PROTEIN: CRP: 11.3 mg/L — AB (ref <8.0)

## 2016-05-11 ENCOUNTER — Ambulatory Visit (INDEPENDENT_AMBULATORY_CARE_PROVIDER_SITE_OTHER): Payer: 59 | Admitting: Psychiatry

## 2016-05-11 ENCOUNTER — Ambulatory Visit (HOSPITAL_COMMUNITY): Payer: Self-pay | Admitting: Psychiatry

## 2016-05-11 ENCOUNTER — Encounter (HOSPITAL_COMMUNITY): Payer: Self-pay | Admitting: Psychiatry

## 2016-05-11 VITALS — BP 118/68 | HR 81 | Ht 71.5 in | Wt 177.0 lb

## 2016-05-11 DIAGNOSIS — Z79899 Other long term (current) drug therapy: Secondary | ICD-10-CM

## 2016-05-11 DIAGNOSIS — Z87891 Personal history of nicotine dependence: Secondary | ICD-10-CM | POA: Diagnosis not present

## 2016-05-11 DIAGNOSIS — F1490 Cocaine use, unspecified, uncomplicated: Secondary | ICD-10-CM | POA: Diagnosis not present

## 2016-05-11 DIAGNOSIS — Z79891 Long term (current) use of opiate analgesic: Secondary | ICD-10-CM | POA: Diagnosis not present

## 2016-05-11 DIAGNOSIS — F3341 Major depressive disorder, recurrent, in partial remission: Secondary | ICD-10-CM

## 2016-05-11 MED ORDER — HYDROXYZINE PAMOATE 50 MG PO CAPS: 100.0000 mg | ORAL_CAPSULE | Freq: Every day | ORAL | 1 refills | Status: DC

## 2016-05-11 MED ORDER — VENLAFAXINE HCL ER 37.5 MG PO CP24: 37.5000 mg | ORAL_CAPSULE | Freq: Every day | ORAL | 1 refills | Status: DC

## 2016-05-11 NOTE — Patient Instructions (Signed)
STOP Lexapro  Start Effexor; increase to 2 tablets in 1 week  Take Vistaril 100 mg at night  Come back to see me in 2 months     Venlafaxine extended-release capsules What is this medicine? VENLAFAXINE(VEN la fax een) is used to treat depression, anxiety and panic disorder. This medicine may be used for other purposes; ask your health care provider or pharmacist if you have questions. COMMON BRAND NAME(S): Effexor XR What should I tell my health care provider before I take this medicine? They need to know if you have any of these conditions: -bleeding disorders -glaucoma -heart disease -high blood pressure -high cholesterol -kidney disease -liver disease -low levels of sodium in the blood -mania or bipolar disorder -seizures -suicidal thoughts, plans, or attempt; a previous suicide attempt by you or a family -take medicines that treat or prevent blood clots -thyroid disease -an unusual or allergic reaction to venlafaxine, desvenlafaxine, other medicines, foods, dyes, or preservatives -pregnant or trying to get pregnant -breast-feeding How should I use this medicine? Take this medicine by mouth with a full glass of water. Follow the directions on the prescription label. Do not cut, crush, or chew this medicine. Take it with food. If needed, the capsule may be carefully opened and the entire contents sprinkled on a spoonful of cool applesauce. Swallow the applesauce/pellet mixture right away without chewing and follow with a glass of water to ensure complete swallowing of the pellets. Try to take your medicine at about the same time each day. Do not take your medicine more often than directed. Do not stop taking this medicine suddenly except upon the advice of your doctor. Stopping this medicine too quickly may cause serious side effects or your condition may worsen. A special MedGuide will be given to you by the pharmacist with each prescription and refill. Be sure to read this  information carefully each time. Talk to your pediatrician regarding the use of this medicine in children. Special care may be needed. Overdosage: If you think you have taken too much of this medicine contact a poison control center or emergency room at once. NOTE: This medicine is only for you. Do not share this medicine with others. What if I miss a dose? If you miss a dose, take it as soon as you can. If it is almost time for your next dose, take only that dose. Do not take double or extra doses. What may interact with this medicine? Do not take this medicine with any of the following medications: -certain medicines for fungal infections like fluconazole, itraconazole, ketoconazole, posaconazole, voriconazole -cisapride -desvenlafaxine -dofetilide -dronedarone -duloxetine -levomilnacipran -linezolid -MAOIs like Carbex, Eldepryl, Marplan, Nardil, and Parnate -methylene blue (injected into a vein) -milnacipran -pimozide -thioridazine -ziprasidone This medicine may also interact with the following medications: -amphetamines -aspirin and aspirin-like medicines -certain medicines for depression, anxiety, or psychotic disturbances -certain medicines for migraine headaches like almotriptan, eletriptan, frovatriptan, naratriptan, rizatriptan, sumatriptan, zolmitriptan -certain medicines for sleep -certain medicines that treat or prevent blood clots like dalteparin, enoxaparin, warfarin -cimetidine -clozapine -diuretics -fentanyl -furazolidone -indinavir -isoniazid -lithium -metoprolol -NSAIDS, medicines for pain and inflammation, like ibuprofen or naproxen -other medicines that prolong the QT interval (cause an abnormal heart rhythm) -procarbazine -rasagiline -supplements like St. John's wort, kava kava, valerian -tramadol -tryptophan This list may not describe all possible interactions. Give your health care provider a list of all the medicines, herbs, non-prescription drugs,  or dietary supplements you use. Also tell them if you smoke, drink alcohol, or use  illegal drugs. Some items may interact with your medicine. What should I watch for while using this medicine? Tell your doctor if your symptoms do not get better or if they get worse. Visit your doctor or health care professional for regular checks on your progress. Because it may take several weeks to see the full effects of this medicine, it is important to continue your treatment as prescribed by your doctor. Patients and their families should watch out for new or worsening thoughts of suicide or depression. Also watch out for sudden changes in feelings such as feeling anxious, agitated, panicky, irritable, hostile, aggressive, impulsive, severely restless, overly excited and hyperactive, or not being able to sleep. If this happens, especially at the beginning of treatment or after a change in dose, call your health care professional. This medicine can cause an increase in blood pressure. Check with your doctor for instructions on monitoring your blood pressure while taking this medicine. You may get drowsy or dizzy. Do not drive, use machinery, or do anything that needs mental alertness until you know how this medicine affects you. Do not stand or sit up quickly, especially if you are an older patient. This reduces the risk of dizzy or fainting spells. Alcohol may interfere with the effect of this medicine. Avoid alcoholic drinks. Your mouth may get dry. Chewing sugarless gum, sucking hard candy and drinking plenty of water will help. Contact your doctor if the problem does not go away or is severe. What side effects may I notice from receiving this medicine? Side effects that you should report to your doctor or health care professional as soon as possible: -allergic reactions like skin rash, itching or hives, swelling of the face, lips, or tongue -anxious -breathing problems -confusion -changes in vision -chest  pain -confusion -elevated mood, decreased need for sleep, racing thoughts, impulsive behavior -eye pain -fast, irregular heartbeat -feeling faint or lightheaded, falls -feeling agitated, angry, or irritable -hallucination, loss of contact with reality -high blood pressure -loss of balance or coordination -palpitations -redness, blistering, peeling or loosening of the skin, including inside the mouth -restlessness, pacing, inability to keep still -seizures -stiff muscles -suicidal thoughts or other mood changes -trouble passing urine or change in the amount of urine -trouble sleeping -unusual bleeding or bruising -unusually weak or tired -vomiting Side effects that usually do not require medical attention (report to your doctor or health care professional if they continue or are bothersome): -change in sex drive or performance -change in appetite or weight -constipation -dizziness -dry mouth -headache -increased sweating -nausea -tired This list may not describe all possible side effects. Call your doctor for medical advice about side effects. You may report side effects to FDA at 1-800-FDA-1088. Where should I keep my medicine? Keep out of the reach of children. Store at a controlled temperature between 20 and 25 degrees C (68 degrees and 77 degrees F), in a dry place. Throw away any unused medicine after the expiration date. NOTE: This sheet is a summary. It may not cover all possible information. If you have questions about this medicine, talk to your doctor, pharmacist, or health care provider.  2018 Elsevier/Gold Standard (2015-07-11 18:38:02)

## 2016-05-11 NOTE — Progress Notes (Signed)
[facility] MD/PA/NP OP Progress Note  05/11/2016 3:12 PM Juan Wiggins  MRN:  [id number]  Chief Complaint: Still anxious and depressed  Subjective:  Juan Wiggins presents today for psychiatric follow-up. Spent much of the visit getting to know the patient, learning about his past social struggles, and this lengthy six-year divorce that he has been struggling with. He minimizes much of his role in the divorce, and admits that he is estranged from his 3 children. He is vague as to why he has become estranged, and again minimizes his role in damaging the relationship.  He was last seen on October 2017. He reports that he was in the hospital for knee surgeries, and various procedures. He reports that he's been meaning to come back to the office for some time.  He reports that he continues to struggle with depression and anxiety symptoms. He feels really anxious during the nighttime when it's time to go to sleep, and asks about using Xanax in conjunction with his Ambien. I spent time educating the patient about the negative effects of using benzodiazepines, in conjunction with hypnotics and opiates. Discussed that we could use Vistaril 100 mg at night for sleep. He continues to struggle with depressive symptoms. We discussed using Effexor is that of Lexapro, and the risks and benefits of this switch. He is not taking Remeron any longer, due to no improvements. I noticed that Symbyax was on his medication list, and he reports he hasn't taken this for about 6 months.    Visit Diagnosis:    ICD-9-CM ICD-10-CM   1. Recurrent major depressive disorder, in partial remission (HCC) 296.35 F33.41 hydrOXYzine (VISTARIL) 50 MG capsule     venlafaxine XR (EFFEXOR XR) 37.5 MG 24 hr capsule    Past Psychiatric History:  Never seen psychiatrist, no previous admission  Past Medical History:  Past Medical History:  Diagnosis Date  . Acute renal failure (ARF) ([place]) 2017   TOOK HEMODIALYSIS X 5 WEEKS   . Anxiety   .  Arthritis    RHEUMATOID ARTHRITIS; OA LEFT KNEE  . Cancer (HCC)    MELANOMA REMOVED RT SHOULDER  . Depression   . Family history of adverse reaction to anesthesia    MOTHER AND SISTER SLOW TO WAKE UP  . GERD (gastroesophageal reflux disease)    PREVACID IF NEEDED  . Heart murmur   . Hemorrhoids   . Hepatitis A    PT STATES TYPE OF HEPATITIS YOU GET FROM SHELLFISH  . Hepatitis B   . Hepatitis C   . Hypertension    OFF ALL MEDS LAST 2 MONTHS   . Hypothyroidism   . Inguinal hernia    RIGHT  . 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 Pancreatitis, alcoholic, acute 95/2841  . Pneumonia 01/02/2016  . Seizures ([place]) 2017   ONCE WHILE IN HOSPITAL NONE SINCE  . Septic arthritis ([place]) 01/14/2016   RT KNEE  . Sleep apnea    CLAUSTROPHOBIC - COULD NOT TOLERATE CPAP MASK  . Tachycardia    PT STATES HIS HEART RATE USUALLY 100 OR MORE    Past Surgical History:  Procedure Laterality Date  . colonscopy     . ELBOW SURGERY     BILATERAL ELBOW SURGERY  . EXCISIONAL TOTAL KNEE ARTHROPLASTY WITH ANTIBIOTIC SPACERS Right 01/21/2016   Procedure: REMOVAL OF KNEE PROSTESIS WITH RIGHT KNEE PLACEMENT OF ANTIBIOTIC SPACER;  Surgeon: Latanya Maudlin, MD;  Location:  [facility];  Service: Orthopedics;  Laterality: Right;  . EXCISIONAL TOTAL KNEE ARTHROPLASTY WITH ANTIBIOTIC SPACERS Right 04/06/2016   Procedure: Right knee repeat irrigation and debridement, antibiotic spacer vers right knee reimplantation right total knee;  Surgeon: Paralee Cancel, MD;  Location: [facility];  Service: Orthopedics;  Laterality: Right;  Requests 2 hours  . I&D EXTREMITY Right 01/02/2016   Procedure: IRRIGATION AND DEBRIDEMENT RIGHT TOTAL KNEE;  Surgeon: Latanya Maudlin, MD;  Location: [facility];  Service: Orthopedics;  Laterality: Right;  . I&D KNEE WITH POLY EXCHANGE Right 07/16/2015   Procedure: IRRIGATION AND DEBRIDEMENT RIGHT KNEE WITH POLY EXCHANGE AND PLACEMENT OF ANTIBIOTIC BEADS;  Surgeon: Latanya Maudlin, MD;  Location: [facility];  Service: Orthopedics;  Laterality: Right;  . KNEE ARTHROSCOPY Right 07/14/2015   Procedure: ARTHROSCOPY IRRIGATION AND DEBRIDEMENT - KNEE;  Surgeon: Justice Britain, MD;  Location: [facility];  Service: Orthopedics;  Laterality: Right;  . KNEE ARTHROSCOPY Right 12/29/2015   Procedure: ARTHROSCOPIC IRRIGATION AND DEBRIEDMENT RIGHT  KNEE;  Surgeon: Melina Schools, MD;  Location: [facility];  Service: Orthopedics;  Laterality: Right;  . KNEE ARTHROSCOPY Right 01/15/2016   Procedure: ARTHROSCOPIC WASHOUT;  Surgeon: Latanya Maudlin, MD;  Location: [facility];  Service: Orthopedics;  Laterality: Right;  . KNEE SURGERY     BILATERAL KNEE ARTHROSCOPY  . left TKR     July 2015  . LUMBAR LAMINECTOMY/DECOMPRESSION MICRODISCECTOMY Left 06/06/2014   Procedure: LEFT L4-5 DECOMPRESSION ;  Surgeon: Melina Schools, MD;  Location: [facility];  Service: Orthopedics;  Laterality: Left;  . REFRACTIVE SURGERY Bilateral   . SHOULDER SURGERY     RIGHT ROTATOR CUFF REPAIR AND LEFT ARTHROSCOPY  . TOTAL KNEE ARTHROPLASTY Left 08/23/2013   Procedure: LEFT TOTAL KNEE ARTHROPLASTY;  Surgeon: Tobi Bastos, MD;  Location: [facility];  Service: Orthopedics;  Laterality: Left;  . TOTAL KNEE ARTHROPLASTY Right 02/07/2014   Procedure: RIGHT TOTAL KNEE ARTHROPLASTY;  Surgeon: Tobi Bastos, MD;  Location: [facility];  Service: Orthopedics;  Laterality: Right;    Family Psychiatric History: denies mental health issues, denies suicide attempt  Family History:  Family History  Problem Relation Age of Onset  . Hypertension Mother   . Other Mother     varicose veins  . Hypertension Sister     Social History:  Social History   Social History  . Marital status: Legally Separated    Spouse name: N/A  . Number of children: N/A  . Years of education: N/A   Social History Main Topics  . Smoking status: Former Smoker    Packs/day: 1.50    Years: 35.00    Types: Cigars    Quit date: 02/22/1998  . Smokeless tobacco:  Never Used     Comment: used cigarettes 20 years ago  . Alcohol use No     Comment: " last alcohol use was in December 2015."  . Drug use: Yes    Types: Cocaine     Comment: Pt states that he hasn't used since 10/2015  . Sexual activity: Yes    Birth control/ protection: None     Comment: given flavored condoms per Pt request   Other Topics Concern  . None   Social History Narrative  . None    Allergies:  Allergies  Allergen Reactions  . Cefazolin Other (See Comments)    Patient had a seizure while on cefazolin and no other core cause could be identified other than the beta lactam  . Heparin Other (See Comments)  Low platelets (130s), no HIT testing performed, platelets recovered.  . Wellbutrin [Bupropion] Other (See Comments)    Hallucinations-started on 150mg  daily dosage    Metabolic Disorder Labs: No results found for: HGBA1C, MPG No results found for: PROLACTIN Lab Results  Component Value Date   TRIG 88 05/22/2014     Current Medications: Current Outpatient Prescriptions  Medication Sig Dispense Refill  . acetaminophen (TYLENOL) 500 MG tablet Take 2 tablets (1,000 mg total) by mouth every 8 (eight) hours. 30 tablet 0  . albuterol (PROVENTIL HFA;VENTOLIN HFA) 108 (90 Base) MCG/ACT inhaler Inhale 2-4 puffs into the lungs daily as needed for shortness of breath.     . Cholecalciferol 2000 units CAPS Take 6,000 Units by mouth daily.     Marland Kitchen docusate sodium (COLACE) 100 MG capsule Take 1 capsule (100 mg total) by mouth 2 (two) times daily. 10 capsule 0  . doxycycline (VIBRA-TABS) 100 MG tablet Take 1 tablet (100 mg total) by mouth every 12 (twelve) hours. 60 tablet 1  . levothyroxine (SYNTHROID, LEVOTHROID) 175 MCG tablet Take 175 mcg by mouth daily before breakfast.    . methocarbamol (ROBAXIN) 500 MG tablet Take 1 tablet (500 mg total) by mouth every 6 (six) hours as needed for muscle spasms. 40 tablet 0  . ondansetron (ZOFRAN) 4 MG tablet TAKE 1 TABLET BY MOUTH  EVERY 8 HOURS AS NEEDED FOR NAUSEA OR VOMITING. 20 tablet 1  . oxyCODONE 10 MG TABS Take 1-2 tablets (10-20 mg total) by mouth every 4 (four) hours as needed for severe pain. 60 tablet 0  . polyethylene glycol (MIRALAX / GLYCOLAX) packet Take 17 g by mouth 2 (two) times daily. 14 each 0  . promethazine (PHENERGAN) 25 MG tablet Take 25 mg by mouth every 6 (six) hours as needed for nausea or vomiting.    Marland Kitchen RAPAFLO 8 MG CAPS capsule Take 8 mg by mouth daily with breakfast.     . tenofovir (VIREAD) 300 MG tablet Take 1 tablet (300 mg total) by mouth daily. 30 tablet 6  . testosterone cypionate (DEPOTESTOSTERONE CYPIONATE) 200 MG/ML injection     . valACYclovir (VALTREX) 1000 MG tablet Take 1,000 mg by mouth See admin instructions. Take 1 tablet by mouth daily for flares - until one day past outbreak  2  . zolpidem (AMBIEN) 10 MG tablet Take 1/2 to 1 tablet by mouth as needed at bedtime 30 tablet 2  . ferrous sulfate 325 (65 FE) MG tablet Take 1 tablet (325 mg total) by mouth 3 (three) times daily after meals. (Patient not taking: Reported on 04/28/2016)  3  . hydrOXYzine (VISTARIL) 50 MG capsule Take 2 capsules (100 mg total) by mouth at bedtime. For sleep 180 capsule 1  . venlafaxine XR (EFFEXOR XR) 37.5 MG 24 hr capsule Take 1 capsule (37.5 mg total) by mouth daily. 90 capsule 1   No current facility-administered medications for this visit.    Neurologic: Headache: No Seizure: No Paresthesias: No  Musculoskeletal: Strength & Muscle Tone: within normal limits Gait & Station: normal Patient leans: N/A  Psychiatric Specialty Exam: Review of Systems  Psychiatric/Behavioral: Positive for depression and suicidal ideas. Negative for hallucinations and substance abuse. The patient is nervous/anxious and has insomnia.   All other systems reviewed and are negative.   Blood pressure 118/68, pulse 81, height 5' 11.5" (1.816 m), weight 80.3 kg (177 lb).Body mass index is 24.34 kg/m.  General  Appearance: Fairly Groomed  Eye Contact:  Good  Speech:  Clear and Coherent  Volume:  Normal  Mood:  Depressed  Affect:  Constricted and Depressed  Thought Process:  Coherent and Goal Directed  Orientation:  Full (Time, Place, and Person)  Thought Content: Logical   Suicidal Thoughts:  No  Homicidal Thoughts:  No  Memory:  Immediate;   Good Recent;   Good Remote;   Good  Judgement:  Fair  Insight:  Fair  Psychomotor Activity:  Normal  Concentration:  Concentration: Good and Attention Span: Good  Recall:  Good  Fund of Knowledge: Good  Language: Good  Akathisia:  NA  Handed:  Right  AIMS (if indicated):  N/A  Assets:  Communication Skills Desire for Improvement Housing Transportation  ADL's:  Intact  Cognition: WNL  Sleep:  4-5 hours, intermittent awakenings    Treatment Plan Summary: IVIS HENNEMAN is a 66 year old male with alcohol use disorder, depression, and personality disorder who presents today for psychiatric med management follow-up. He engages in care intermittently, and tends to generally be focused on extra locus of control and discussing his mood and anxiety symptoms. He presents today requesting assistance with anxiety, depression, and sleep. He denies any acute safety issues. We discussed a titration of his medications as below, and follow-up in 6-8 weeks or sooner if needed.  - Discontinued Lexapro - Initiate Effexor 37.5 mg XR, increase to 2 tablets in 1 week - Initiate Vistaril 100 mg nightly for sleep - Patient may continue his Ambien 10 mg nightly - Educated patient on the risk of using benzos, opiates, and recommended against the use of Xanax - Follow-up in 6-8 weeks - Discussed some behavioral strategies and activating strategies to help with his depression  Aundra Dubin, MD 05/11/2016, 3:12 PM

## 2016-05-27 ENCOUNTER — Encounter: Payer: Self-pay | Admitting: Internal Medicine

## 2016-05-27 ENCOUNTER — Ambulatory Visit (INDEPENDENT_AMBULATORY_CARE_PROVIDER_SITE_OTHER): Payer: 59 | Admitting: Internal Medicine

## 2016-05-27 VITALS — BP 131/82 | HR 93 | Temp 98.2°F | Ht 71.0 in | Wt 176.0 lb

## 2016-05-27 DIAGNOSIS — N183 Chronic kidney disease, stage 3 unspecified: Secondary | ICD-10-CM

## 2016-05-27 DIAGNOSIS — B180 Chronic viral hepatitis B with delta-agent: Secondary | ICD-10-CM

## 2016-05-27 DIAGNOSIS — B009 Herpesviral infection, unspecified: Secondary | ICD-10-CM | POA: Diagnosis not present

## 2016-05-27 DIAGNOSIS — T8450XS Infection and inflammatory reaction due to unspecified internal joint prosthesis, sequela: Secondary | ICD-10-CM

## 2016-05-27 DIAGNOSIS — B182 Chronic viral hepatitis C: Secondary | ICD-10-CM

## 2016-05-27 LAB — CBC WITH DIFFERENTIAL/PLATELET
BASOS PCT: 1 %
Basophils Absolute: 95 cells/uL (ref 0–200)
EOS PCT: 6 %
Eosinophils Absolute: 570 cells/uL — ABNORMAL HIGH (ref 15–500)
HCT: 43.2 % (ref 38.5–50.0)
HEMOGLOBIN: 14.3 g/dL (ref 13.2–17.1)
LYMPHS ABS: 2945 {cells}/uL (ref 850–3900)
Lymphocytes Relative: 31 %
MCH: 31.3 pg (ref 27.0–33.0)
MCHC: 33.1 g/dL (ref 32.0–36.0)
MCV: 94.5 fL (ref 80.0–100.0)
MPV: 10 fL (ref 7.5–12.5)
Monocytes Absolute: 760 cells/uL (ref 200–950)
Monocytes Relative: 8 %
NEUTROS ABS: 5130 {cells}/uL (ref 1500–7800)
Neutrophils Relative %: 54 %
PLATELETS: 333 10*3/uL (ref 140–400)
RBC: 4.57 MIL/uL (ref 4.20–5.80)
RDW: 16.6 % — ABNORMAL HIGH (ref 11.0–15.0)
WBC: 9.5 10*3/uL (ref 3.8–10.8)

## 2016-05-27 LAB — BASIC METABOLIC PANEL
BUN: 12 mg/dL (ref 7–25)
CO2: 25 mmol/L (ref 20–31)
Calcium: 9.4 mg/dL (ref 8.6–10.3)
Chloride: 106 mmol/L (ref 98–110)
Creat: 2.11 mg/dL — ABNORMAL HIGH (ref 0.70–1.25)
Glucose, Bld: 74 mg/dL (ref 65–99)
Potassium: 4.5 mmol/L (ref 3.5–5.3)
SODIUM: 138 mmol/L (ref 135–146)

## 2016-05-27 MED ORDER — VALACYCLOVIR HCL 1 G PO TABS: 1000.0000 mg | ORAL_TABLET | Freq: Every day | ORAL | 11 refills | Status: DC

## 2016-05-27 NOTE — Progress Notes (Signed)
rfv: prosthetic joint infection  Patient ID: Juan Wiggins, male   DOB: 11-06-1950, 66 y.o.   MRN: [id number]  HPI Juan Wiggins is a 66 yo M who has chronic hepatitis B, finished treatment for Hep C in January 2018, but most recently has had complicated case of MSSA PJI. His most recent resection arthroplasty, and abtx spacer was on 2/12. He finished oral abtx and has beein off of abtx for roughly 2 wks. He feels less heat in hir right knee, less swelling as in prior surgeries.   Marland Kitchen He last saw a psychiatrist in mid march for on going insomnia. He has started on vystaril in addition to [place]. Still has difficulty staying asleep rather than difficulty going to sleep.   He last saw dr Alvan Dame last week who did knee aspirate to see if infection is cleared. He is close to making appt for new implant.  He reports having recurrent genital herpes roughly 3-4x per year, and wonders if he should take valtrex.   I have reviewed his records in [place] link  Outpatient Encounter Prescriptions as of 05/27/2016  Medication Sig  . acetaminophen (TYLENOL) 500 MG tablet Take 2 tablets (1,000 mg total) by mouth every 8 (eight) hours.  . hydrOXYzine (VISTARIL) 50 MG capsule Take 2 capsules (100 mg total) by mouth at bedtime. For sleep  . levothyroxine (SYNTHROID, LEVOTHROID) 175 MCG tablet Take 175 mcg by mouth daily before breakfast.  . methocarbamol (ROBAXIN) 500 MG tablet Take 1 tablet (500 mg total) by mouth every 6 (six) hours as needed for muscle spasms.  . ondansetron (ZOFRAN) 4 MG tablet TAKE 1 TABLET BY MOUTH EVERY 8 HOURS AS NEEDED FOR NAUSEA OR VOMITING.  Marland Kitchen oxyCODONE 10 MG TABS Take 1-2 tablets (10-20 mg total) by mouth every 4 (four) hours as needed for severe pain.  . polyethylene glycol (MIRALAX / GLYCOLAX) packet Take 17 g by mouth 2 (two) times daily.  . promethazine (PHENERGAN) 25 MG tablet Take 25 mg by mouth every 6 (six) hours as needed for nausea or vomiting.  Marland Kitchen RAPAFLO 8 MG CAPS capsule Take 8  mg by mouth daily with breakfast.   . tenofovir (VIREAD) 300 MG tablet Take 1 tablet (300 mg total) by mouth daily.  Marland Kitchen testosterone cypionate (DEPOTESTOSTERONE CYPIONATE) 200 MG/ML injection   . valACYclovir (VALTREX) 1000 MG tablet Take 1,000 mg by mouth See admin instructions. Take 1 tablet by mouth daily for flares - until one day past outbreak  . venlafaxine XR (EFFEXOR XR) 37.5 MG 24 hr capsule Take 1 capsule (37.5 mg total) by mouth daily.  Marland Kitchen zolpidem (AMBIEN) 10 MG tablet Take 1/2 to 1 tablet by mouth as needed at bedtime  . [DISCONTINUED] docusate sodium (COLACE) 100 MG capsule Take 1 capsule (100 mg total) by mouth 2 (two) times daily.  Marland Kitchen albuterol (PROVENTIL HFA;VENTOLIN HFA) 108 (90 Base) MCG/ACT inhaler Inhale 2-4 puffs into the lungs daily as needed for shortness of breath.   . Cholecalciferol 2000 units CAPS Take 6,000 Units by mouth daily.   Marland Kitchen doxycycline (VIBRA-TABS) 100 MG tablet Take 1 tablet (100 mg total) by mouth every 12 (twelve) hours. (Patient not taking: Reported on 05/27/2016)  . ferrous sulfate 325 (65 FE) MG tablet Take 1 tablet (325 mg total) by mouth 3 (three) times daily after meals. (Patient not taking: Reported on 04/28/2016)   No facility-administered encounter medications on file as of 05/27/2016.      Patient Active Problem List   Diagnosis  Date Noted  . Acquired absence of knee joint following explantation of joint prosthesis with presence of antibiotic-impregnated cement spacer 04/06/2016  . S/P total knee arthroplasty 04/06/2016  . Infection of prosthetic right knee joint ([place]) 04/06/2016  . Chronic renal insufficiency, stage 3 (moderate) 04/06/2016  . Rheumatoid arthritis ([place]) 04/06/2016  . Unintentional weight loss 04/06/2016  . Obstructive sleep apnea 02/11/2016  . Hx of mixed drug abuse 01/29/2016  . Seizure ([place])   . IVDU (intravenous drug user)   . Staphylococcal arthritis of right knee ([place])   . Chronic hepatitis B without delta agent without  cirrhosis (HCC)   . Chronic hepatitis C without hepatic coma ([place]) 07/18/2015  . Normocytic anemia 07/18/2015  . Opacity of lung on imaging study, bilateral 07/18/2015  . Anxiety state 07/18/2015  . Chronic pain syndrome 05/21/2014  . Essential hypertension 08/28/2013  . GERD (gastroesophageal reflux disease) 08/28/2013  . Osteoarthritis of left knee 08/23/2013  . Alcohol dependence ([place]) 07/14/2013  . Major depression 07/07/2013  . Hypothyroidism 04/13/2011     Health Maintenance Due  Topic Date Due  . COLONOSCOPY  06/21/2002     Review of Systems + right knee swelling, improved. Insomnia. Otherwise 12 point ros is negative Physical Exam   BP 131/82   Pulse 93   Temp 98.2 F (36.8 C) (Oral)   Ht 5\' 11"  (1.803 m)   Wt 176 lb (79.8 kg)   BMI 24.55 kg/m    Physical Exam  Constitutional: He is oriented to person, place, and time. He appears well-developed and well-nourished. No distress.  HENT:  Mouth/Throat: Oropharynx is clear and moist. No oropharyngeal exudate.  Cardiovascular: Normal rate, regular rhythm and normal heart sounds. Exam reveals no gallop and no friction rub.  No murmur heard.  Pulmonary/Chest: Effort normal and breath sounds normal. No respiratory distress. He has no wheezes.  Ext: right knee slight warm but swelling is only slight. Crepitus with range of motion movement Skin: Skin is warm and dry. No rash noted. No erythema.  Psychiatric: He has a normal mood and affect. His behavior is normal.    Lab Results  Component Value Date   HEPBSAB NEG 09/03/2015   Lab Results  Component Value Date   LABRPR NON REACTIVE 04/14/2011    CBC Lab Results  Component Value Date   WBC 6.8 04/10/2016   RBC 3.23 (L) 04/10/2016   HGB 9.6 (L) 04/10/2016   HCT 28.8 (L) 04/10/2016   PLT 216 04/10/2016   MCV 89.2 04/10/2016   MCH 29.7 04/10/2016   MCHC 33.3 04/10/2016   RDW 15.1 04/10/2016   LYMPHSABS 2,250 03/26/2016   MONOABS 630 03/26/2016   EOSABS 360  03/26/2016    BMET Lab Results  Component Value Date   NA 138 04/10/2016   K 4.7 04/10/2016   CL 110 04/10/2016   CO2 25 04/10/2016   GLUCOSE 90 04/10/2016   BUN 35 (H) 04/10/2016   CREATININE 1.86 (H) 04/10/2016   CALCIUM 9.1 04/10/2016   GFRNONAA 37 (L) 04/10/2016   GFRAA 43 (L) 04/10/2016      Assessment and Plan  Chronic Hepatitis b = repeat labs to see cr function if ok in dosing.   mssa pji = off of meds awaiting new implant. Will check sed rate and crp  Chronic hepatitis c without hepatic coma = will check viral load to ensure SVR 12+, documented.previously VL have been negative  hsv = will start on daily valtrex for suppression

## 2016-05-28 LAB — SEDIMENTATION RATE: SED RATE: 12 mm/h (ref 0–20)

## 2016-05-28 LAB — C-REACTIVE PROTEIN: CRP: 17.2 mg/L — AB (ref <8.0)

## 2016-05-29 ENCOUNTER — Other Ambulatory Visit: Payer: Self-pay | Admitting: Internal Medicine

## 2016-05-29 LAB — HEPATITIS C RNA QUANTITATIVE
HCV QUANT LOG: NOT DETECTED {Log_IU}/mL
HCV QUANT: NOT DETECTED [IU]/mL

## 2016-06-01 ENCOUNTER — Other Ambulatory Visit: Payer: Self-pay | Admitting: Orthopedic Surgery

## 2016-06-01 NOTE — Telephone Encounter (Signed)
Patient continues to have nausea, needs refill.

## 2016-06-01 NOTE — Patient Instructions (Addendum)
FALCON MCCASKEY  06/01/2016   Your procedure is scheduled on:   Report to [facility] Main  Entrance              Report to admitting at     0800 AM   Call this number if you have problems the morning of surgery [phone]   Remember: ONLY 1 PERSON MAY GO WITH YOU TO SHORT STAY TO GET  READY MORNING OF [place].  Do not eat food or drink liquids :After Midnight.     Take these medicines the morning of surgery with A SIP OF WATER:  Albuterol (bring),  Gabapentin if needed, synthroid, oxycodone if needed,    rapaflo, Zofran,  Viread, Valtrex, Vanafexine (effexor)                                You may not have any metal on your body including hair pins and              piercings  Do not wear jewelry,lotions, powders or perfumes, deodorant                          Men may shave face and neck.   Do not bring valuables to the hospital. [place].  Contacts, dentures or bridgework may not be worn into surgery.  Leave suitcase in the car. After surgery it may be brought to your room.                  Please read over the following fact sheets you were given: _____________________________________________________________________           [facility] - Preparing for Surgery Before surgery, you can play an important role.  Because skin is not sterile, your skin needs to be as free of germs as possible.  You can reduce the number of germs on your skin by washing with CHG (chlorahexidine gluconate) soap before surgery.  CHG is an antiseptic cleaner which kills germs and bonds with the skin to continue killing germs even after washing. Please DO NOT use if you have an allergy to CHG or antibacterial soaps.  If your skin becomes reddened/irritated stop using the CHG and inform your nurse when you arrive at Short Stay. Do not shave (including legs and underarms) for at least 48 hours prior to the first CHG shower.  You  may shave your face/neck. Please follow these instructions carefully:  1.  Shower with CHG Soap the night before surgery and the  morning of Surgery.  2.  If you choose to wash your hair, wash your hair first as usual with your  normal  shampoo.  3.  After you shampoo, rinse your hair and body thoroughly to remove the  shampoo.                           4.  Use CHG as you would any other liquid soap.  You can apply chg directly  to the skin and wash                       Gently with a  scrungie or clean washcloth.  5.  Apply the CHG Soap to your body ONLY FROM THE NECK DOWN.   Do not use on face/ open                           Wound or open sores. Avoid contact with eyes, ears mouth and genitals (private parts).                       Wash face,  Genitals (private parts) with your normal soap.             6.  Wash thoroughly, paying special attention to the area where your surgery  will be performed.  7.  Thoroughly rinse your body with warm water from the neck down.  8.  DO NOT shower/wash with your normal soap after using and rinsing off  the CHG Soap.                9.  Pat yourself dry with a clean towel.            10.  Wear clean pajamas.            11.  Place clean sheets on your bed the night of your first shower and do not  sleep with pets. Day of Surgery : Do not apply any lotions/deodorants the morning of surgery.  Please wear clean clothes to the hospital/surgery center.  FAILURE TO FOLLOW THESE INSTRUCTIONS MAY RESULT IN THE CANCELLATION OF YOUR SURGERY PATIENT SIGNATURE_________________________________  NURSE SIGNATURE__________________________________  ________________________________________________________________________  WHAT IS A BLOOD TRANSFUSION? Blood Transfusion Information  A transfusion is the replacement of blood or some of its parts. Blood is made up of multiple cells which provide different functions.  Red blood cells carry oxygen and are used for blood loss  replacement.  White blood cells fight against infection.  Platelets control bleeding.  Plasma helps clot blood.  Other blood products are available for specialized needs, such as hemophilia or other clotting disorders. BEFORE THE TRANSFUSION  Who gives blood for transfusions?   Healthy volunteers who are fully evaluated to make sure their blood is safe. This is blood bank blood. Transfusion therapy is the safest it has ever been in the practice of medicine. Before blood is taken from a donor, a complete history is taken to make sure that person has no history of diseases nor engages in risky social behavior (examples are intravenous drug use or sexual activity with multiple partners). The donor's travel history is screened to minimize risk of transmitting infections, such as malaria. The donated blood is tested for signs of infectious diseases, such as HIV and hepatitis. The blood is then tested to be sure it is compatible with you in order to minimize the chance of a transfusion reaction. If you or a relative donates blood, this is often done in anticipation of surgery and is not appropriate for emergency situations. It takes many days to process the donated blood. RISKS AND COMPLICATIONS Although transfusion therapy is very safe and saves many lives, the main dangers of transfusion include:   Getting an infectious disease.  Developing a transfusion reaction. This is an allergic reaction to something in the blood you were given. Every precaution is taken to prevent this. The decision to have a blood transfusion has been considered carefully by your caregiver before blood is given. Blood is not given unless the benefits outweigh the risks. AFTER  THE TRANSFUSION  Right after receiving a blood transfusion, you will usually feel much better and more energetic. This is especially true if your red blood cells have gotten low (anemic). The transfusion raises the level of the red blood cells which  carry oxygen, and this usually causes an energy increase.  The nurse administering the transfusion will monitor you carefully for complications. HOME CARE INSTRUCTIONS  No special instructions are needed after a transfusion. You may find your energy is better. Speak with your caregiver about any limitations on activity for underlying diseases you may have. SEEK MEDICAL CARE IF:   Your condition is not improving after your transfusion.  You develop redness or irritation at the intravenous (IV) site. SEEK IMMEDIATE MEDICAL CARE IF:  Any of the following symptoms occur over the next 12 hours:  Shaking chills.  You have a temperature by mouth above 102 F (38.9 C), not controlled by medicine.  Chest, back, or muscle pain.  People around you feel you are not acting correctly or are confused.  Shortness of breath or difficulty breathing.  Dizziness and fainting.  You get a rash or develop hives.  You have a decrease in urine output.  Your urine turns a dark color or changes to pink, red, or brown. Any of the following symptoms occur over the next 10 days:  You have a temperature by mouth above 102 F (38.9 C), not controlled by medicine.  Shortness of breath.  Weakness after normal activity.  The white part of the eye turns yellow (jaundice).  You have a decrease in the amount of urine or are urinating less often.  Your urine turns a dark color or changes to pink, red, or brown. Document Released: 02/07/2000 Document Revised: 05/04/2011 Document Reviewed: 09/26/2007 ExitCare Patient Information 2014 [place].  _______________________________________________________________________  Incentive Spirometer  An incentive spirometer is a tool that can help keep your lungs clear and active. This tool measures how well you are filling your lungs with each breath. Taking long deep breaths may help reverse or decrease the chance of developing breathing (pulmonary) problems  (especially infection) following:  A long period of time when you are unable to move or be active. BEFORE THE PROCEDURE   If the spirometer includes an indicator to show your best effort, your nurse or respiratory therapist will set it to a desired goal.  If possible, sit up straight or lean slightly forward. Try not to slouch.  Hold the incentive spirometer in an upright position. INSTRUCTIONS FOR USE  1. Sit on the edge of your bed if possible, or sit up as far as you can in bed or on a chair. 2. Hold the incentive spirometer in an upright position. 3. Breathe out normally. 4. Place the mouthpiece in your mouth and seal your lips tightly around it. 5. Breathe in slowly and as deeply as possible, raising the piston or the ball toward the top of the column. 6. Hold your breath for 3-5 seconds or for as long as possible. Allow the piston or ball to fall to the bottom of the column. 7. Remove the mouthpiece from your mouth and breathe out normally. 8. Rest for a few seconds and repeat Steps 1 through 7 at least 10 times every 1-2 hours when you are awake. Take your time and take a few normal breaths between deep breaths. 9. The spirometer may include an indicator to show your best effort. Use the indicator as a goal to work toward during each  repetition. 10. After each set of 10 deep breaths, practice coughing to be sure your lungs are clear. If you have an incision (the cut made at the time of surgery), support your incision when coughing by placing a pillow or rolled up towels firmly against it. Once you are able to get out of bed, walk around indoors and cough well. You may stop using the incentive spirometer when instructed by your caregiver.  RISKS AND COMPLICATIONS  Take your time so you do not get dizzy or light-headed.  If you are in pain, you may need to take or ask for pain medication before doing incentive spirometry. It is harder to take a deep breath if you are having  pain. AFTER USE  Rest and breathe slowly and easily.  It can be helpful to keep track of a log of your progress. Your caregiver can provide you with a simple table to help with this. If you are using the spirometer at home, follow these instructions: [place] IF:   You are having difficultly using the spirometer.  You have trouble using the spirometer as often as instructed.  Your pain medication is not giving enough relief while using the spirometer.  You develop fever of 100.5 F (38.1 C) or higher. SEEK IMMEDIATE MEDICAL CARE IF:   You cough up bloody sputum that had not been present before.  You develop fever of 102 F (38.9 C) or greater.  You develop worsening pain at or near the incision site. MAKE SURE YOU:   Understand these instructions.  Will watch your condition.  Will get help right away if you are not doing well or get worse. Document Released: 06/22/2006 Document Revised: 05/04/2011 Document Reviewed: 08/23/2006 [facility] Patient Information 2014 [place], [place].   ________________________________________________________________________

## 2016-06-01 NOTE — Progress Notes (Signed)
Please place orders for Juan Wiggins his preop apt. Is 4/10 at 1300. Thank you very much

## 2016-06-02 ENCOUNTER — Encounter (HOSPITAL_COMMUNITY): Payer: Self-pay

## 2016-06-02 ENCOUNTER — Encounter (HOSPITAL_COMMUNITY)
Admission: RE | Admit: 2016-06-02 | Discharge: 2016-06-02 | Disposition: A | Discharge status: Home / Self Care | Payer: 59 | Source: Ambulatory Visit | Attending: Orthopedic Surgery | Admitting: Orthopedic Surgery

## 2016-06-02 DIAGNOSIS — B181 Chronic viral hepatitis B without delta-agent: Secondary | ICD-10-CM | POA: Diagnosis not present

## 2016-06-02 DIAGNOSIS — R918 Other nonspecific abnormal finding of lung field: Secondary | ICD-10-CM | POA: Insufficient documentation

## 2016-06-02 DIAGNOSIS — F411 Generalized anxiety disorder: Secondary | ICD-10-CM | POA: Diagnosis not present

## 2016-06-02 DIAGNOSIS — F1990 Other psychoactive substance use, unspecified, uncomplicated: Secondary | ICD-10-CM | POA: Diagnosis not present

## 2016-06-02 DIAGNOSIS — Z01812 Encounter for preprocedural laboratory examination: Secondary | ICD-10-CM | POA: Insufficient documentation

## 2016-06-02 DIAGNOSIS — M1712 Unilateral primary osteoarthritis, left knee: Secondary | ICD-10-CM | POA: Diagnosis not present

## 2016-06-02 DIAGNOSIS — R569 Unspecified convulsions: Secondary | ICD-10-CM | POA: Insufficient documentation

## 2016-06-02 DIAGNOSIS — F329 Major depressive disorder, single episode, unspecified: Secondary | ICD-10-CM | POA: Diagnosis not present

## 2016-06-02 DIAGNOSIS — F1020 Alcohol dependence, uncomplicated: Secondary | ICD-10-CM | POA: Diagnosis not present

## 2016-06-02 DIAGNOSIS — M00061 Staphylococcal arthritis, right knee: Secondary | ICD-10-CM | POA: Diagnosis not present

## 2016-06-02 DIAGNOSIS — D649 Anemia, unspecified: Secondary | ICD-10-CM | POA: Diagnosis not present

## 2016-06-02 LAB — COMPREHENSIVE METABOLIC PANEL
ALT: 13 U/L — ABNORMAL LOW (ref 17–63)
ANION GAP: 5 (ref 5–15)
AST: 22 U/L (ref 15–41)
Albumin: 4.1 g/dL (ref 3.5–5.0)
Alkaline Phosphatase: 119 U/L (ref 38–126)
BILIRUBIN TOTAL: 0.5 mg/dL (ref 0.3–1.2)
BUN: 15 mg/dL (ref 6–20)
CHLORIDE: 108 mmol/L (ref 101–111)
CO2: 23 mmol/L (ref 22–32)
Calcium: 9.6 mg/dL (ref 8.9–10.3)
Creatinine, Ser: 2.1 mg/dL — ABNORMAL HIGH (ref 0.61–1.24)
GFR calc Af Amer: 37 mL/min — ABNORMAL LOW (ref >60)
GFR calc non Af Amer: 32 mL/min — ABNORMAL LOW (ref >60)
GLUCOSE: 93 mg/dL (ref 65–99)
POTASSIUM: 4.3 mmol/L (ref 3.5–5.1)
SODIUM: 136 mmol/L (ref 135–145)
Total Protein: 7.8 g/dL (ref 6.5–8.1)

## 2016-06-02 LAB — SURGICAL PCR SCREEN
MRSA, PCR: NEGATIVE
Staphylococcus aureus: NEGATIVE

## 2016-06-02 NOTE — Progress Notes (Signed)
cmp routed to Dr. Alvan Dame via epic

## 2016-06-02 NOTE — Progress Notes (Addendum)
Bmp 05/27/16 cxr 05/27/16 Cbc/ bmp 05/27/16 ekg 04/09/16 Echo 07/16/15 All in epic

## 2016-06-09 ENCOUNTER — Inpatient Hospital Stay (HOSPITAL_COMMUNITY)
Admission: RE | Admit: 2016-06-09 | Discharge: 2016-06-11 | DRG: 468 | Disposition: A | Discharge status: Home Health | Payer: 59 | Source: Ambulatory Visit | Attending: Orthopedic Surgery | Admitting: Orthopedic Surgery

## 2016-06-09 ENCOUNTER — Encounter (HOSPITAL_COMMUNITY): Payer: Self-pay | Admitting: Anesthesiology

## 2016-06-09 ENCOUNTER — Encounter (HOSPITAL_COMMUNITY)
Admission: RE | Disposition: A | Discharge status: Home Health | Payer: Self-pay | Source: Ambulatory Visit | Attending: Orthopedic Surgery

## 2016-06-09 ENCOUNTER — Inpatient Hospital Stay (HOSPITAL_COMMUNITY): Payer: 59 | Admitting: Anesthesiology

## 2016-06-09 DIAGNOSIS — Z85820 Personal history of malignant melanoma of skin: Secondary | ICD-10-CM

## 2016-06-09 DIAGNOSIS — Z96651 Presence of right artificial knee joint: Secondary | ICD-10-CM

## 2016-06-09 DIAGNOSIS — B1920 Unspecified viral hepatitis C without hepatic coma: Secondary | ICD-10-CM | POA: Diagnosis present

## 2016-06-09 DIAGNOSIS — M21961 Unspecified acquired deformity of right lower leg: Principal | ICD-10-CM | POA: Diagnosis present

## 2016-06-09 DIAGNOSIS — I10 Essential (primary) hypertension: Secondary | ICD-10-CM | POA: Diagnosis present

## 2016-06-09 DIAGNOSIS — Z7982 Long term (current) use of aspirin: Secondary | ICD-10-CM

## 2016-06-09 DIAGNOSIS — F329 Major depressive disorder, single episode, unspecified: Secondary | ICD-10-CM | POA: Diagnosis present

## 2016-06-09 DIAGNOSIS — Z96652 Presence of left artificial knee joint: Secondary | ICD-10-CM | POA: Diagnosis present

## 2016-06-09 DIAGNOSIS — G4730 Sleep apnea, unspecified: Secondary | ICD-10-CM | POA: Diagnosis present

## 2016-06-09 DIAGNOSIS — Z96659 Presence of unspecified artificial knee joint: Secondary | ICD-10-CM

## 2016-06-09 DIAGNOSIS — E039 Hypothyroidism, unspecified: Secondary | ICD-10-CM | POA: Diagnosis present

## 2016-06-09 DIAGNOSIS — Z87891 Personal history of nicotine dependence: Secondary | ICD-10-CM

## 2016-06-09 DIAGNOSIS — Z79899 Other long term (current) drug therapy: Secondary | ICD-10-CM

## 2016-06-09 HISTORY — PX: REIMPLANTATION OF TOTAL KNEE: SHX6052

## 2016-06-09 LAB — TYPE AND SCREEN
ABO/RH(D): A NEG
ANTIBODY SCREEN: NEGATIVE

## 2016-06-09 SURGERY — REVISION, TOTAL ARTHROPLASTY, KNEE
Anesthesia: Regional | Site: Knee | Laterality: Right

## 2016-06-09 MED ORDER — SODIUM CHLORIDE 0.9 % IR SOLN
Status: DC | PRN
Start: 2016-06-09 — End: 2016-06-09
  Administered 2016-06-09: 1000 mL

## 2016-06-09 MED ORDER — ROCURONIUM BROMIDE 50 MG/5ML IV SOSY
PREFILLED_SYRINGE | INTRAVENOUS | Status: AC
  Filled 2016-06-09: qty 5

## 2016-06-09 MED ORDER — KETAMINE HCL 10 MG/ML IJ SOLN
INTRAMUSCULAR | Status: AC
  Filled 2016-06-09: qty 1

## 2016-06-09 MED ORDER — DEXTROSE 5 % IV SOLN
5.0000 mg/kg | Freq: Once | INTRAVENOUS | Status: AC
  Administered 2016-06-09: 400 mg via INTRAVENOUS
  Filled 2016-06-09: qty 10

## 2016-06-09 MED ORDER — FENTANYL CITRATE (PF) 100 MCG/2ML IJ SOLN
INTRAMUSCULAR | Status: AC
  Filled 2016-06-09: qty 2

## 2016-06-09 MED ORDER — BUPIVACAINE HCL (PF) 0.25 % IJ SOLN
INTRAMUSCULAR | Status: DC | PRN
  Administered 2016-06-09: 30 mL

## 2016-06-09 MED ORDER — BISACODYL 10 MG RE SUPP: 10.0000 mg | Freq: Every day | RECTAL | Status: DC | PRN

## 2016-06-09 MED ORDER — ASPIRIN EC 325 MG PO TBEC
325.0000 mg | DELAYED_RELEASE_TABLET | Freq: Two times a day (BID) | ORAL | Status: DC
  Administered 2016-06-10 – 2016-06-11 (×3): 325 mg via ORAL
  Filled 2016-06-09 (×3): qty 1

## 2016-06-09 MED ORDER — SODIUM CHLORIDE 0.9 % IR SOLN
Status: DC | PRN
  Administered 2016-06-09: 3000 mL
  Administered 2016-06-09: 1000 mL

## 2016-06-09 MED ORDER — HYDROMORPHONE HCL 2 MG/ML IJ SOLN
0.2500 mg | INTRAMUSCULAR | Status: DC | PRN
  Administered 2016-06-09 (×4): 0.5 mg via INTRAVENOUS

## 2016-06-09 MED ORDER — DOCUSATE SODIUM 100 MG PO CAPS
100.0000 mg | ORAL_CAPSULE | Freq: Two times a day (BID) | ORAL | Status: DC
  Administered 2016-06-09 – 2016-06-11 (×4): 100 mg via ORAL
  Filled 2016-06-09 (×4): qty 1

## 2016-06-09 MED ORDER — SUGAMMADEX SODIUM 200 MG/2ML IV SOLN
INTRAVENOUS | Status: AC
  Filled 2016-06-09: qty 2

## 2016-06-09 MED ORDER — HYDROMORPHONE HCL 2 MG/ML IJ SOLN: 0.5000 mg | INTRAMUSCULAR | Status: DC | PRN

## 2016-06-09 MED ORDER — DEXAMETHASONE SODIUM PHOSPHATE 10 MG/ML IJ SOLN
10.0000 mg | Freq: Once | INTRAMUSCULAR | Status: AC
  Administered 2016-06-09: 10 mg via INTRAVENOUS

## 2016-06-09 MED ORDER — TAMSULOSIN HCL 0.4 MG PO CAPS
0.4000 mg | ORAL_CAPSULE | Freq: Every day | ORAL | Status: DC
  Administered 2016-06-10 – 2016-06-11 (×2): 0.4 mg via ORAL
  Filled 2016-06-09 (×2): qty 1

## 2016-06-09 MED ORDER — ONDANSETRON HCL 4 MG PO TABS: 4.0000 mg | ORAL_TABLET | Freq: Four times a day (QID) | ORAL | Status: DC | PRN

## 2016-06-09 MED ORDER — BUPIVACAINE-EPINEPHRINE (PF) 0.5% -1:200000 IJ SOLN
INTRAMUSCULAR | Status: DC | PRN
  Administered 2016-06-09: 30 mL via PERINEURAL

## 2016-06-09 MED ORDER — MENTHOL 3 MG MT LOZG: 1.0000 | LOZENGE | OROMUCOSAL | Status: DC | PRN

## 2016-06-09 MED ORDER — METOCLOPRAMIDE HCL 5 MG/ML IJ SOLN: 5.0000 mg | Freq: Three times a day (TID) | INTRAMUSCULAR | Status: DC | PRN

## 2016-06-09 MED ORDER — FERROUS SULFATE 325 (65 FE) MG PO TABS
325.0000 mg | ORAL_TABLET | Freq: Three times a day (TID) | ORAL | Status: DC
  Administered 2016-06-10: 09:00:00 325 mg via ORAL
  Filled 2016-06-09 (×3): qty 1

## 2016-06-09 MED ORDER — TENOFOVIR DISOPROXIL FUMARATE 300 MG PO TABS
300.0000 mg | ORAL_TABLET | Freq: Every day | ORAL | Status: DC
  Administered 2016-06-10 – 2016-06-11 (×2): 300 mg via ORAL
  Filled 2016-06-09 (×2): qty 1

## 2016-06-09 MED ORDER — ALUM & MAG HYDROXIDE-SIMETH 200-200-20 MG/5ML PO SUSP: 15.0000 mL | ORAL | Status: DC | PRN

## 2016-06-09 MED ORDER — PHENOL 1.4 % MT LIQD: 1.0000 | OROMUCOSAL | Status: DC | PRN

## 2016-06-09 MED ORDER — LIDOCAINE 2% (20 MG/ML) 5 ML SYRINGE
INTRAMUSCULAR | Status: DC | PRN
  Administered 2016-06-09: 100 mg via INTRAVENOUS

## 2016-06-09 MED ORDER — KETAMINE HCL 10 MG/ML IJ SOLN
INTRAMUSCULAR | Status: DC | PRN
  Administered 2016-06-09 (×2): 10 mg via INTRAVENOUS
  Administered 2016-06-09: 20 mg via INTRAVENOUS

## 2016-06-09 MED ORDER — SODIUM CHLORIDE 0.9 % IV SOLN
INTRAVENOUS | Status: DC
  Administered 2016-06-09: 18:00:00 via INTRAVENOUS
  Filled 2016-06-09 (×6): qty 1000

## 2016-06-09 MED ORDER — SODIUM CHLORIDE 0.9 % IJ SOLN
INTRAMUSCULAR | Status: AC
  Filled 2016-06-09: qty 50

## 2016-06-09 MED ORDER — CHLORHEXIDINE GLUCONATE 4 % EX LIQD: 60.0000 mL | Freq: Once | CUTANEOUS | Status: DC

## 2016-06-09 MED ORDER — ONDANSETRON HCL 4 MG/2ML IJ SOLN
INTRAMUSCULAR | Status: AC
  Filled 2016-06-09: qty 2

## 2016-06-09 MED ORDER — HYDROXYZINE HCL 50 MG PO TABS
100.0000 mg | ORAL_TABLET | Freq: Every day | ORAL | Status: DC
  Administered 2016-06-10: 22:00:00 100 mg via ORAL
  Filled 2016-06-09 (×2): qty 2

## 2016-06-09 MED ORDER — GABAPENTIN 300 MG PO CAPS
300.0000 mg | ORAL_CAPSULE | Freq: Four times a day (QID) | ORAL | Status: DC | PRN
  Administered 2016-06-10 (×2): 300 mg via ORAL
  Filled 2016-06-09 (×2): qty 1

## 2016-06-09 MED ORDER — LEVOTHYROXINE SODIUM 50 MCG PO TABS
175.0000 ug | ORAL_TABLET | Freq: Every day | ORAL | Status: DC
  Administered 2016-06-10 – 2016-06-11 (×2): 175 ug via ORAL
  Filled 2016-06-09 (×2): qty 1

## 2016-06-09 MED ORDER — VANCOMYCIN HCL IN DEXTROSE 1-5 GM/200ML-% IV SOLN
INTRAVENOUS | Status: AC
  Filled 2016-06-09: qty 200

## 2016-06-09 MED ORDER — VANCOMYCIN HCL IN DEXTROSE 1-5 GM/200ML-% IV SOLN
1000.0000 mg | Freq: Two times a day (BID) | INTRAVENOUS | Status: AC
  Administered 2016-06-09: 1000 mg via INTRAVENOUS
  Filled 2016-06-09: qty 200

## 2016-06-09 MED ORDER — KETOROLAC TROMETHAMINE 30 MG/ML IJ SOLN
INTRAMUSCULAR | Status: DC | PRN
  Administered 2016-06-09: 30 mg

## 2016-06-09 MED ORDER — GLYCOPYRROLATE 0.2 MG/ML IV SOSY
PREFILLED_SYRINGE | INTRAVENOUS | Status: AC
Start: 2016-06-09 — End: 2016-06-09
  Filled 2016-06-09: qty 5

## 2016-06-09 MED ORDER — MAGNESIUM CITRATE PO SOLN: 1.0000 | Freq: Once | ORAL | Status: DC | PRN

## 2016-06-09 MED ORDER — ZOLPIDEM TARTRATE 10 MG PO TABS
10.0000 mg | ORAL_TABLET | Freq: Every day | ORAL | Status: DC
  Administered 2016-06-09 – 2016-06-10 (×2): 10 mg via ORAL
  Filled 2016-06-09 (×2): qty 1

## 2016-06-09 MED ORDER — CELECOXIB 200 MG PO CAPS
200.0000 mg | ORAL_CAPSULE | Freq: Two times a day (BID) | ORAL | Status: DC
Start: 2016-06-09 — End: 2016-06-11
  Administered 2016-06-09 – 2016-06-11 (×4): 200 mg via ORAL
  Filled 2016-06-09 (×4): qty 1

## 2016-06-09 MED ORDER — METOCLOPRAMIDE HCL 5 MG PO TABS: 5.0000 mg | ORAL_TABLET | Freq: Three times a day (TID) | ORAL | Status: DC | PRN

## 2016-06-09 MED ORDER — TRANEXAMIC ACID 1000 MG/10ML IV SOLN
1000.0000 mg | INTRAVENOUS | Status: AC
  Administered 2016-06-09: 1000 mg via INTRAVENOUS
  Filled 2016-06-09: qty 1100

## 2016-06-09 MED ORDER — NEOSTIGMINE METHYLSULFATE 5 MG/5ML IV SOSY
PREFILLED_SYRINGE | INTRAVENOUS | Status: AC
  Filled 2016-06-09: qty 5

## 2016-06-09 MED ORDER — EPHEDRINE SULFATE-NACL 50-0.9 MG/10ML-% IV SOSY
PREFILLED_SYRINGE | INTRAVENOUS | Status: DC | PRN
  Administered 2016-06-09 (×2): 10 mg via INTRAVENOUS

## 2016-06-09 MED ORDER — ONDANSETRON HCL 4 MG/2ML IJ SOLN
INTRAMUSCULAR | Status: DC | PRN
  Administered 2016-06-09: 8 mg via INTRAVENOUS

## 2016-06-09 MED ORDER — ROCURONIUM BROMIDE 10 MG/ML (PF) SYRINGE
PREFILLED_SYRINGE | INTRAVENOUS | Status: DC | PRN
  Administered 2016-06-09 (×2): 10 mg via INTRAVENOUS
  Administered 2016-06-09: 30 mg via INTRAVENOUS
  Administered 2016-06-09: 10 mg via INTRAVENOUS

## 2016-06-09 MED ORDER — METHOCARBAMOL 500 MG PO TABS
500.0000 mg | ORAL_TABLET | Freq: Four times a day (QID) | ORAL | Status: DC | PRN
  Administered 2016-06-09 – 2016-06-11 (×4): 500 mg via ORAL
  Filled 2016-06-09 (×4): qty 1

## 2016-06-09 MED ORDER — MIDAZOLAM HCL 2 MG/2ML IJ SOLN
INTRAMUSCULAR | Status: AC
  Filled 2016-06-09: qty 2

## 2016-06-09 MED ORDER — ALPRAZOLAM 0.25 MG PO TABS
0.1250 mg | ORAL_TABLET | Freq: Every evening | ORAL | Status: DC | PRN
  Administered 2016-06-10: 0.125 mg via ORAL
  Filled 2016-06-09: qty 1

## 2016-06-09 MED ORDER — PHENYLEPHRINE 40 MCG/ML (10ML) SYRINGE FOR IV PUSH (FOR BLOOD PRESSURE SUPPORT)
PREFILLED_SYRINGE | INTRAVENOUS | Status: DC | PRN
  Administered 2016-06-09 (×5): 80 ug via INTRAVENOUS

## 2016-06-09 MED ORDER — DIPHENHYDRAMINE HCL 25 MG PO CAPS: 25.0000 mg | ORAL_CAPSULE | Freq: Four times a day (QID) | ORAL | Status: DC | PRN

## 2016-06-09 MED ORDER — MEPERIDINE HCL 50 MG/ML IJ SOLN: 6.2500 mg | INTRAMUSCULAR | Status: DC | PRN

## 2016-06-09 MED ORDER — ALBUTEROL SULFATE (2.5 MG/3ML) 0.083% IN NEBU: 3.0000 mL | INHALATION_SOLUTION | Freq: Every day | RESPIRATORY_TRACT | Status: DC | PRN

## 2016-06-09 MED ORDER — SODIUM CHLORIDE 0.9 % IJ SOLN
INTRAMUSCULAR | Status: DC | PRN
  Administered 2016-06-09: 30 mL

## 2016-06-09 MED ORDER — HYDROMORPHONE HCL 2 MG/ML IJ SOLN
INTRAMUSCULAR | Status: AC
  Filled 2016-06-09: qty 1

## 2016-06-09 MED ORDER — VENLAFAXINE HCL ER 37.5 MG PO CP24
37.5000 mg | ORAL_CAPSULE | Freq: Every day | ORAL | Status: DC
  Administered 2016-06-10 – 2016-06-11 (×2): 37.5 mg via ORAL
  Filled 2016-06-09 (×2): qty 1

## 2016-06-09 MED ORDER — MIDAZOLAM HCL 5 MG/ML IJ SOLN
1.0000 mg | INTRAMUSCULAR | Status: DC | PRN
  Administered 2016-06-09: 1 mg via INTRAVENOUS

## 2016-06-09 MED ORDER — LACTATED RINGERS IV SOLN
INTRAVENOUS | Status: DC
  Administered 2016-06-09 (×4): via INTRAVENOUS

## 2016-06-09 MED ORDER — PHENYLEPHRINE 40 MCG/ML (10ML) SYRINGE FOR IV PUSH (FOR BLOOD PRESSURE SUPPORT)
PREFILLED_SYRINGE | INTRAVENOUS | Status: AC
  Filled 2016-06-09: qty 10

## 2016-06-09 MED ORDER — MIDAZOLAM HCL 2 MG/2ML IJ SOLN
INTRAMUSCULAR | Status: AC
Start: 2016-06-09 — End: 2016-06-09
  Administered 2016-06-09: 2 mg
  Filled 2016-06-09: qty 2

## 2016-06-09 MED ORDER — FENTANYL CITRATE (PF) 100 MCG/2ML IJ SOLN
INTRAMUSCULAR | Status: AC
  Administered 2016-06-09: 100 ug via INTRAVENOUS
  Filled 2016-06-09: qty 2

## 2016-06-09 MED ORDER — ONDANSETRON HCL 4 MG/2ML IJ SOLN: 4.0000 mg | Freq: Four times a day (QID) | INTRAMUSCULAR | Status: DC | PRN

## 2016-06-09 MED ORDER — SUCCINYLCHOLINE CHLORIDE 200 MG/10ML IV SOSY
PREFILLED_SYRINGE | INTRAVENOUS | Status: DC | PRN
  Administered 2016-06-09: 100 mg via INTRAVENOUS

## 2016-06-09 MED ORDER — GLYCOPYRROLATE 0.2 MG/ML IV SOSY
PREFILLED_SYRINGE | INTRAVENOUS | Status: DC | PRN
  Administered 2016-06-09: 0.6 mg via INTRAVENOUS

## 2016-06-09 MED ORDER — PROMETHAZINE HCL 25 MG/ML IJ SOLN: 6.2500 mg | INTRAMUSCULAR | Status: DC | PRN

## 2016-06-09 MED ORDER — PROPOFOL 10 MG/ML IV BOLUS
INTRAVENOUS | Status: DC | PRN
Start: 2016-06-09 — End: 2016-06-09
  Administered 2016-06-09: 230 mg via INTRAVENOUS

## 2016-06-09 MED ORDER — OXYCODONE HCL 5 MG PO TABS
10.0000 mg | ORAL_TABLET | ORAL | Status: DC
  Administered 2016-06-09: 21:00:00 15 mg via ORAL
  Administered 2016-06-09 – 2016-06-10 (×4): 20 mg via ORAL
  Administered 2016-06-10: 25 mg via ORAL
  Administered 2016-06-10 (×2): 20 mg via ORAL
  Administered 2016-06-11 (×3): 25 mg via ORAL
  Filled 2016-06-09: qty 5
  Filled 2016-06-09 (×3): qty 4
  Filled 2016-06-09: qty 5
  Filled 2016-06-09 (×3): qty 4
  Filled 2016-06-09: qty 3
  Filled 2016-06-09: qty 5
  Filled 2016-06-09: qty 2
  Filled 2016-06-09: qty 5

## 2016-06-09 MED ORDER — PROPOFOL 10 MG/ML IV BOLUS
INTRAVENOUS | Status: AC
  Filled 2016-06-09: qty 20

## 2016-06-09 MED ORDER — VANCOMYCIN HCL IN DEXTROSE 1-5 GM/200ML-% IV SOLN
1000.0000 mg | INTRAVENOUS | Status: AC
  Administered 2016-06-09: 1000 mg via INTRAVENOUS

## 2016-06-09 MED ORDER — POLYETHYLENE GLYCOL 3350 17 G PO PACK
17.0000 g | PACK | Freq: Two times a day (BID) | ORAL | Status: DC
Start: 2016-06-09 — End: 2016-06-11
  Administered 2016-06-11: 17 g via ORAL

## 2016-06-09 MED ORDER — ACETAMINOPHEN 500 MG PO TABS
1000.0000 mg | ORAL_TABLET | Freq: Three times a day (TID) | ORAL | Status: DC
  Administered 2016-06-09 – 2016-06-11 (×5): 1000 mg via ORAL
  Filled 2016-06-09 (×5): qty 2

## 2016-06-09 MED ORDER — VALACYCLOVIR HCL 500 MG PO TABS
1000.0000 mg | ORAL_TABLET | Freq: Every day | ORAL | Status: DC | PRN
  Filled 2016-06-09: qty 2

## 2016-06-09 MED ORDER — NEOSTIGMINE METHYLSULFATE 10 MG/10ML IV SOLN
INTRAVENOUS | Status: DC | PRN
  Administered 2016-06-09: 4 mg via INTRAVENOUS

## 2016-06-09 MED ORDER — PROMETHAZINE HCL 25 MG PO TABS: 25.0000 mg | ORAL_TABLET | Freq: Four times a day (QID) | ORAL | Status: DC | PRN

## 2016-06-09 MED ORDER — KETOROLAC TROMETHAMINE 30 MG/ML IJ SOLN
INTRAMUSCULAR | Status: AC
  Filled 2016-06-09: qty 1

## 2016-06-09 MED ORDER — FENTANYL CITRATE (PF) 100 MCG/2ML IJ SOLN
50.0000 ug | INTRAMUSCULAR | Status: AC | PRN
Start: 2016-06-09 — End: 2016-06-09
  Administered 2016-06-09 (×8): 50 ug via INTRAVENOUS
  Administered 2016-06-09: 100 ug via INTRAVENOUS
  Administered 2016-06-09 (×2): 50 ug via INTRAVENOUS

## 2016-06-09 MED ORDER — FERROUS SULFATE 325 (65 FE) MG PO TABS: 325.0000 mg | ORAL_TABLET | Freq: Three times a day (TID) | ORAL | Status: DC

## 2016-06-09 MED ORDER — BUPIVACAINE HCL (PF) 0.25 % IJ SOLN
INTRAMUSCULAR | Status: AC
  Filled 2016-06-09: qty 30

## 2016-06-09 MED ORDER — SODIUM CHLORIDE 0.9 % IR SOLN
Status: DC | PRN
  Administered 2016-06-09: 1000 mL

## 2016-06-09 MED ORDER — BISACODYL 5 MG PO TBEC: 5.0000 mg | DELAYED_RELEASE_TABLET | Freq: Two times a day (BID) | ORAL | Status: DC | PRN

## 2016-06-09 MED ORDER — METHOCARBAMOL 1000 MG/10ML IJ SOLN
500.0000 mg | Freq: Four times a day (QID) | INTRAVENOUS | Status: DC | PRN
  Administered 2016-06-09: 500 mg via INTRAVENOUS
  Filled 2016-06-09: qty 550

## 2016-06-09 MED ORDER — DEXAMETHASONE SODIUM PHOSPHATE 10 MG/ML IJ SOLN
10.0000 mg | Freq: Once | INTRAMUSCULAR | Status: AC
  Administered 2016-06-10: 13:00:00 10 mg via INTRAVENOUS
  Filled 2016-06-09: qty 1

## 2016-06-09 SURGICAL SUPPLY — 76 items
ADAPTER BOLT FEMORAL +2/-2 (Knees) ×3 IMPLANT
AUG FEM DIST PFC 4 8 RT (Knees) ×1 IMPLANT
AUGMENT FEM DIST PFC 4 8 RT (Knees) ×1 IMPLANT
BAG ZIPLOCK 12X15 (MISCELLANEOUS) ×3 IMPLANT
BANDAGE ACE 6X5 VEL STRL LF (GAUZE/BANDAGES/DRESSINGS) ×3 IMPLANT
BANDAGE ESMARK 6X9 LF (GAUZE/BANDAGES/DRESSINGS) ×1 IMPLANT
BLADE SAW SGTL 11.0X1.19X90.0M (BLADE) IMPLANT
BLADE SAW SGTL 13.0X1.19X90.0M (BLADE) ×3 IMPLANT
BLADE SAW SGTL 81X20 HD (BLADE) ×3 IMPLANT
BNDG ESMARK 6X9 LF (GAUZE/BANDAGES/DRESSINGS) ×3
BONE CEMENT GENTAMICIN (Cement) ×9 IMPLANT
CEMENT BONE GENTAMICIN 40 (Cement) ×3 IMPLANT
COVER SURGICAL LIGHT HANDLE (MISCELLANEOUS) ×3 IMPLANT
CUFF TOURN SGL QUICK 34 (TOURNIQUET CUFF) ×2
CUFF TRNQT CYL 34X4X40X1 (TOURNIQUET CUFF) ×1 IMPLANT
DERMABOND ADVANCED (GAUZE/BANDAGES/DRESSINGS) ×2
DERMABOND ADVANCED .7 DNX12 (GAUZE/BANDAGES/DRESSINGS) ×1 IMPLANT
DISTAL AUGMENT (Knees) ×2 IMPLANT
DRAPE EXTREMITY T 121X128X90 (DRAPE) ×3 IMPLANT
DRAPE POUCH INSTRU U-SHP 10X18 (DRAPES) ×3 IMPLANT
DRAPE U-SHAPE 47X51 STRL (DRAPES) ×3 IMPLANT
DRESSING AQUACEL AG SP 3.5X10 (GAUZE/BANDAGES/DRESSINGS) IMPLANT
DRSG AQUACEL AG ADV 3.5X14 (GAUZE/BANDAGES/DRESSINGS) ×3 IMPLANT
DRSG AQUACEL AG SP 3.5X10 (GAUZE/BANDAGES/DRESSINGS)
DRSG PAD ABDOMINAL 8X10 ST (GAUZE/BANDAGES/DRESSINGS) ×3 IMPLANT
DURAPREP 26ML APPLICATOR (WOUND CARE) ×6 IMPLANT
ELECT REM PT RETURN 15FT ADLT (MISCELLANEOUS) ×3 IMPLANT
FACESHIELD WRAPAROUND (MASK) ×15 IMPLANT
FEMORAL ADAPTER (Orthopedic Implant) ×3 IMPLANT
FEMUR SIGMA PS SZ 4.0 R (Femur) ×3 IMPLANT
GAUZE SPONGE 4X4 12PLY STRL (GAUZE/BANDAGES/DRESSINGS) ×6 IMPLANT
GAUZE XEROFORM 5X9 LF (GAUZE/BANDAGES/DRESSINGS) ×3 IMPLANT
GLOVE BIOGEL M 7.0 STRL (GLOVE) IMPLANT
GLOVE BIOGEL PI IND STRL 7.5 (GLOVE) ×5 IMPLANT
GLOVE BIOGEL PI IND STRL 8.5 (GLOVE) ×1 IMPLANT
GLOVE BIOGEL PI INDICATOR 7.5 (GLOVE) ×10
GLOVE BIOGEL PI INDICATOR 8.5 (GLOVE) ×2
GLOVE ECLIPSE 8.0 STRL XLNG CF (GLOVE) ×6 IMPLANT
GLOVE ORTHO TXT STRL SZ7.5 (GLOVE) ×6 IMPLANT
GOWN STRL REUS W/TWL LRG LVL3 (GOWN DISPOSABLE) ×3 IMPLANT
GOWN STRL REUS W/TWL XL LVL3 (GOWN DISPOSABLE) ×9 IMPLANT
HANDPIECE INTERPULSE COAX TIP (DISPOSABLE) ×2
MANIFOLD NEPTUNE II (INSTRUMENTS) ×3 IMPLANT
NDL SAFETY ECLIPSE 18X1.5 (NEEDLE) ×1 IMPLANT
NEEDLE HYPO 18GX1.5 SHARP (NEEDLE) ×2
NS IRRIG 1000ML POUR BTL (IV SOLUTION) ×3 IMPLANT
PADDING CAST COTTON 6X4 STRL (CAST SUPPLIES) ×6 IMPLANT
PATELLA DOME PFC 38MM (Knees) ×3 IMPLANT
PLATE ROT INSERT 12.5MM SIZE 4 (Plate) ×3 IMPLANT
POSITIONER SURGICAL ARM (MISCELLANEOUS) ×3 IMPLANT
RESTRICTOR CEMENT SZ 5 C-STEM (Cement) ×6 IMPLANT
SET HNDPC FAN SPRY TIP SCT (DISPOSABLE) ×1 IMPLANT
SET PAD KNEE POSITIONER (MISCELLANEOUS) ×3 IMPLANT
SLEEVE FEM UNIV FULL PRO SZ34 (Sleeve) ×3 IMPLANT
SPONGE LAP 18X18 X RAY DECT (DISPOSABLE) ×3 IMPLANT
STAPLER VISISTAT 35W (STAPLE) IMPLANT
STEM TIBIA PFC 13X30MM (Stem) ×6 IMPLANT
SUCTION FRAZIER HANDLE 12FR (TUBING) ×2
SUCTION TUBE FRAZIER 12FR DISP (TUBING) ×1 IMPLANT
SUT MNCRL AB 3-0 PS2 18 (SUTURE) ×3 IMPLANT
SUT STRATAFIX 0 PDS 27 VIOLET (SUTURE) ×3
SUT VIC AB 1 CT1 36 (SUTURE) ×9 IMPLANT
SUT VIC AB 2-0 CT1 27 (SUTURE) ×6
SUT VIC AB 2-0 CT1 TAPERPNT 27 (SUTURE) ×3 IMPLANT
SUTURE STRATFX 0 PDS 27 VIOLET (SUTURE) ×1 IMPLANT
SYR 50ML LL SCALE MARK (SYRINGE) ×3 IMPLANT
TOWEL OR 17X26 10 PK STRL BLUE (TOWEL DISPOSABLE) ×3 IMPLANT
TOWER CARTRIDGE SMART MIX (DISPOSABLE) ×3 IMPLANT
TRAY FOLEY W/METER SILVER 16FR (SET/KITS/TRAYS/PACK) ×3 IMPLANT
TRAY REVISION SZ 3 (Knees) ×3 IMPLANT
TRAY SLEEVE CEM ML (Knees) ×3 IMPLANT
WATER STERILE IRR 1500ML POUR (IV SOLUTION) ×6 IMPLANT
WEDGE SIZE 3 5MM (Knees) ×3 IMPLANT
WEDGE STEP SZ.5 5MM (Knees) ×3 IMPLANT
WRAP KNEE MAXI GEL POST OP (GAUZE/BANDAGES/DRESSINGS) ×3 IMPLANT
YANKAUER SUCT BULB TIP 10FT TU (MISCELLANEOUS) IMPLANT

## 2016-06-09 NOTE — Discharge Instructions (Signed)

## 2016-06-09 NOTE — Anesthesia Procedure Notes (Signed)
Procedure Name: Intubation Date/Time: 06/09/2016 10:43 AM Performed by: Claudia Desanctis Pre-anesthesia Checklist: Patient identified, Suction available, Emergency Drugs available and Patient being monitored Patient Re-evaluated:Patient Re-evaluated prior to inductionOxygen Delivery Method: Circle system utilized Preoxygenation: Pre-oxygenation with 100% oxygen Intubation Type: IV induction Ventilation: Mask ventilation without difficulty Laryngoscope Size: Mac and 4 Grade View: Grade I Tube type: Oral Number of attempts: 1 Airway Equipment and Method: Stylet Placement Confirmation: ETT inserted through vocal cords under direct vision,  positive ETCO2 and breath sounds checked- equal and bilateral Secured at: 22 cm Tube secured with: Tape

## 2016-06-09 NOTE — Progress Notes (Signed)
AssistedDr. Foster with right, ultrasound guided, adductor canal block. Side rails up, monitors on throughout procedure. See vital signs in flow sheet. Tolerated Procedure well.  

## 2016-06-09 NOTE — H&P (Signed)
Juan Wiggins is an 66 y.o. male.    Chief Complaint:  S/P right TKA resection and placement of antibiotic spacer  Procedure:    Removal of antibiotic spacer and reimplantation of TKA  HPI: Pt is a 66 y.o. male complaining of right pain.  Patient had a removal of TKA due to infection and an antibiotic spacer placed.  He has been on IV antibiotics and followed by Infectious Disease.  His labs have been good and he wishes to proceed with a reimplantation of the right TKA.  Various options are discussed with the patient including a new antibiotic spacer depending on how the knee looks upon removing the antibiotic spacer.  Risks, benefits and expectations were discussed with the patient. Patient understand the risks, benefits and expectations and wishes to proceed with surgery.    PCP: Helane Rima, MD  D/C Plans:       Home   Post-op Meds:       No Rx given  Tranexamic Acid:      To be given - IV    Decadron:      Is to be given    PMH: Past Medical History:  Diagnosis Date  . Acute renal failure (ARF) ([place]) 2017   TOOK HEMODIALYSIS X 5 WEEKS   . Anxiety   . Arthritis    RHEUMATOID ARTHRITIS; OA LEFT KNEE  . Cancer ([place])    MELANOMA REMOVED  LT SHOULDER  . Depression   . Family history of adverse reaction to anesthesia    MOTHER AND SISTER SLOW TO WAKE UP  . GERD (gastroesophageal reflux disease)    PREVACID IF NEEDED  . Heart murmur   . Hemorrhoids   . Hepatitis A    PT STATES TYPE OF HEPATITIS YOU GET FROM SHELLFISH  . Hepatitis B   . Hepatitis C   . Hypertension    OFF ALL MEDS LAST 2 MONTHS   . Hypothyroidism   . Inguinal hernia    RIGHT  . 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 Pancreatitis, alcoholic, acute 93/5701  . Pneumonia 01/02/2016  . Seizures ([place]) 2017   ONCE WHILE IN HOSPITAL NONE SINCE  . Septic arthritis ([place]) 01/14/2016   RT KNEE  . Sleep apnea    CLAUSTROPHOBIC - COULD NOT TOLERATE CPAP MASK  .  Tachycardia    PT STATES HIS HEART RATE USUALLY 100 OR MORE    PSH: Past Surgical History:  Procedure Laterality Date  . colonscopy     . ELBOW SURGERY     BILATERAL ELBOW SURGERY  . EXCISIONAL TOTAL KNEE ARTHROPLASTY WITH ANTIBIOTIC SPACERS Right 01/21/2016   Procedure: REMOVAL OF KNEE PROSTESIS WITH RIGHT KNEE PLACEMENT OF ANTIBIOTIC SPACER;  Surgeon: Latanya Maudlin, MD;  Location: [facility];  Service: Orthopedics;  Laterality: Right;  . EXCISIONAL TOTAL KNEE ARTHROPLASTY WITH ANTIBIOTIC SPACERS Right 04/06/2016   Procedure: Right knee repeat irrigation and debridement, antibiotic spacer vers right knee reimplantation right total knee;  Surgeon: Paralee Cancel, MD;  Location: [facility];  Service: Orthopedics;  Laterality: Right;  Requests 2 hours  . EYE SURGERY     [facility] [place]  . I&D EXTREMITY Right 01/02/2016   Procedure: IRRIGATION AND DEBRIDEMENT RIGHT TOTAL KNEE;  Surgeon: Latanya Maudlin, MD;  Location: [facility];  Service: Orthopedics;  Laterality: Right;  . I&D KNEE WITH POLY EXCHANGE Right 07/16/2015   Procedure: IRRIGATION AND DEBRIDEMENT RIGHT KNEE WITH POLY  EXCHANGE AND PLACEMENT OF ANTIBIOTIC BEADS;  Surgeon: Latanya Maudlin, MD;  Location: [facility];  Service: Orthopedics;  Laterality: Right;  . KNEE ARTHROSCOPY Right 07/14/2015   Procedure: ARTHROSCOPY IRRIGATION AND DEBRIDEMENT - KNEE;  Surgeon: Justice Britain, MD;  Location: [facility];  Service: Orthopedics;  Laterality: Right;  . KNEE ARTHROSCOPY Right 12/29/2015   Procedure: ARTHROSCOPIC IRRIGATION AND DEBRIEDMENT RIGHT  KNEE;  Surgeon: Melina Schools, MD;  Location: [facility];  Service: Orthopedics;  Laterality: Right;  . KNEE ARTHROSCOPY Right 01/15/2016   Procedure: ARTHROSCOPIC WASHOUT;  Surgeon: Latanya Maudlin, MD;  Location: [facility];  Service: Orthopedics;  Laterality: Right;  . KNEE SURGERY     BILATERAL KNEE ARTHROSCOPY  . left TKR     July 2015  . LUMBAR LAMINECTOMY/DECOMPRESSION MICRODISCECTOMY Left 06/06/2014   Procedure: LEFT L4-5  DECOMPRESSION ;  Surgeon: Melina Schools, MD;  Location: [facility];  Service: Orthopedics;  Laterality: Left;  . REFRACTIVE SURGERY Bilateral   . SHOULDER SURGERY     RIGHT ROTATOR CUFF REPAIR AND LEFT ARTHROSCOPY  . TOTAL KNEE ARTHROPLASTY Left 08/23/2013   Procedure: LEFT TOTAL KNEE ARTHROPLASTY;  Surgeon: Tobi Bastos, MD;  Location: [facility];  Service: Orthopedics;  Laterality: Left;  . TOTAL KNEE ARTHROPLASTY Right 02/07/2014   Procedure: RIGHT TOTAL KNEE ARTHROPLASTY;  Surgeon: Tobi Bastos, MD;  Location: [facility];  Service: Orthopedics;  Laterality: Right;    Social History:  reports that he quit smoking about 18 years ago. His smoking use included Cigars. He has a 52.50 pack-year smoking history. He has never used smokeless tobacco. He reports that he does not drink alcohol or use drugs.  Allergies:  Allergies  Allergen Reactions  . Cefazolin Other (See Comments)    Patient had a seizure while on cefazolin and no other core cause could be identified other than the beta lactam  . Heparin Other (See Comments)    Low platelets (130s), no HIT testing performed, platelets recovered.  . Wellbutrin [Bupropion] Other (See Comments)    Hallucinations-started on 150mg  daily dosage    Medications: No current facility-administered medications for this encounter.    Current Outpatient Prescriptions  Medication Sig Dispense Refill  . albuterol (PROVENTIL HFA;VENTOLIN HFA) 108 (90 Base) MCG/ACT inhaler Inhale 2-4 puffs into the lungs daily as needed for shortness of breath.     . ALPRAZolam (XANAX) 0.25 MG tablet Take 0.125 mg by mouth at bedtime as needed for sleep.    Marland Kitchen aspirin 325 MG tablet Take 325 mg by mouth 2 (two) times daily.    . bisacodyl (DULCOLAX) 5 MG EC tablet Take 5 mg by mouth 2 (two) times daily as needed (for constipation.).    Marland Kitchen cholecalciferol (VITAMIN D) 1000 units tablet Take 1,000 Units by mouth daily.    Marland Kitchen gabapentin (NEURONTIN) 300 MG capsule Take 300 mg by mouth  every 6 (six) hours as needed (for bone/joint/muscle pain.).    Marland Kitchen hydrOXYzine (VISTARIL) 50 MG capsule Take 2 capsules (100 mg total) by mouth at bedtime. For sleep 180 capsule 1  . ibuprofen (ADVIL,MOTRIN) 200 MG tablet Take 200 mg by mouth every 8 (eight) hours as needed (for pain/headaches.).    Marland Kitchen levothyroxine (SYNTHROID, LEVOTHROID) 175 MCG tablet Take 175 mcg by mouth daily at 2 am. 0300    . loperamide (IMODIUM) 2 MG capsule Take 2-4 mg by mouth 3 (three) times daily as needed for diarrhea or loose stools.    . methocarbamol (ROBAXIN) 500 MG tablet Take 1  tablet (500 mg total) by mouth every 6 (six) hours as needed for muscle spasms. 40 tablet 0  . oxyCODONE 10 MG TABS Take 1-2 tablets (10-20 mg total) by mouth every 4 (four) hours as needed for severe pain. (Patient taking differently: Take 10-20 mg by mouth every 6 (six) hours as needed (for pain.). ) 60 tablet 0  . polyethylene glycol (MIRALAX / GLYCOLAX) packet Take 17 g by mouth 2 (two) times daily. (Patient taking differently: Take 17 g by mouth 2 (two) times daily as needed (for constipation). ) 14 each 0  . promethazine (PHENERGAN) 25 MG tablet Take 25 mg by mouth every 6 (six) hours as needed for nausea or vomiting.    Marland Kitchen RAPAFLO 8 MG CAPS capsule Take 8 mg by mouth daily with breakfast.     . senna (SENOKOT) 8.6 MG TABS tablet Take 1 tablet by mouth 2 (two) times daily as needed for mild constipation.    Marland Kitchen tenofovir (VIREAD) 300 MG tablet Take 1 tablet (300 mg total) by mouth daily. 30 tablet 6  . testosterone cypionate (DEPOTESTOSTERONE CYPIONATE) 200 MG/ML injection Inject 100 mg into the muscle every 14 (fourteen) days.     . valACYclovir (VALTREX) 1000 MG tablet Take 1 tablet (1,000 mg total) by mouth daily. 30 tablet 11  . venlafaxine XR (EFFEXOR XR) 37.5 MG 24 hr capsule Take 1 capsule (37.5 mg total) by mouth daily. 90 capsule 1  . zolpidem (AMBIEN) 10 MG tablet Take 1/2 to 1 tablet by mouth as needed at bedtime (Patient taking  differently: Take 10 mg by mouth at bedtime. ) 30 tablet 2  . acetaminophen (TYLENOL) 500 MG tablet Take 2 tablets (1,000 mg total) by mouth every 8 (eight) hours. (Patient not taking: Reported on 05/29/2016) 30 tablet 0  . doxycycline (VIBRA-TABS) 100 MG tablet Take 1 tablet (100 mg total) by mouth every 12 (twelve) hours. (Patient not taking: Reported on 05/27/2016) 60 tablet 1  . ferrous sulfate 325 (65 FE) MG tablet Take 1 tablet (325 mg total) by mouth 3 (three) times daily after meals. (Patient not taking: Reported on 04/28/2016)  3  . ondansetron (ZOFRAN) 4 MG tablet TAKE 1 TABLET BY MOUTH EVERY 8 HOURS AS NEEDED FOR NAUSEA OR VOMITING. 20 tablet 1      Review of Systems  Constitutional: Negative.   HENT: Negative.   Eyes: Negative.   Respiratory: Negative.   Cardiovascular: Negative.   Gastrointestinal: Positive for heartburn.  Genitourinary: Negative.   Musculoskeletal: Positive for joint pain.  Skin: Negative.   Neurological: Negative.   Endo/Heme/Allergies: Negative.   Psychiatric/Behavioral: Positive for depression. The patient is nervous/anxious.        Physical Exam  Constitutional: He is oriented to person, place, and time. He appears well-developed.  HENT:  Head: Normocephalic.  Eyes: Pupils are equal, round, and reactive to light.  Neck: Neck supple. No JVD present. No tracheal deviation present. No thyromegaly present.  Cardiovascular: Normal rate, regular rhythm and intact distal pulses.   Respiratory: Effort normal and breath sounds normal. No respiratory distress. He has no wheezes.  GI: Soft. There is no tenderness. There is no guarding.  Musculoskeletal:       Right knee: He exhibits decreased range of motion, swelling, laceration (healed previous incision) and bony tenderness. He exhibits no ecchymosis, no deformity and no erythema. Tenderness found.  Lymphadenopathy:    He has no cervical adenopathy.  Neurological: He is alert and oriented to person, place,  and  time.  Skin: Skin is warm and dry.  Psychiatric: He has a normal mood and affect.       Assessment/Plan Assessment:   S/P right TKA resection and placement of antibiotic spacer   Plan: Patient will undergo a removal of antibiotic spacer and reimplantation of TKA on 06/09/16 per Dr. Alvan Dame at [facility]. Risks benefits and expectations were discussed with the patient. Patient understand risks, benefits and expectations and wishes to proceed.   [place] Pugh Cyann Venti   PA-C  06/09/2016, 7:13 AM

## 2016-06-09 NOTE — Anesthesia Postprocedure Evaluation (Signed)
Anesthesia Post Note  Patient: Juan Wiggins  Procedure(s) Performed: Procedure(s) (LRB): Removal of antibiotic spacer and reimplantation of total knee arthroplasty (Right)  Patient location during evaluation: PACU Anesthesia Type: Regional Level of consciousness: awake and alert and oriented Pain management: pain level controlled Vital Signs Assessment: post-procedure vital signs reviewed and stable Respiratory status: spontaneous breathing, nonlabored ventilation and respiratory function stable Cardiovascular status: blood pressure returned to baseline and stable Postop Assessment: no signs of nausea or vomiting Anesthetic complications: no       Last Vitals:  Vitals:   06/09/16 1500 06/09/16 1515  BP: 139/81 (!) 149/76  Pulse: 85 88  Resp: 12 (!) 22  Temp:      Last Pain:  Vitals:   06/09/16 1515  TempSrc:   PainSc: 5                  Lyza Houseworth A.

## 2016-06-09 NOTE — Brief Op Note (Signed)
06/09/2016  10:52 AM  PATIENT:  Juan Wiggins  66 y.o. male  PRE-OPERATIVE DIAGNOSIS:  Status post Right total knee resection and placement of antibiotic spacer  POST-OPERATIVE DIAGNOSIS:  Status post Right total knee resection and placement of antibiotic spacer  PROCEDURE:  Procedure(s): Removal of antibiotic spacer and reimplantation of total knee arthroplasty (Right)  SURGEON:  Surgeon(s) and Role:    * Paralee Cancel, MD - Primary  PHYSICIAN ASSISTANT: Danae Orleans, PA-C  ANESTHESIA:   general  EBL:  < 400 cc  BLOOD ADMINISTERED:none  DRAINS: none   LOCAL MEDICATIONS USED:  MARCAINE     SPECIMEN:  No Specimen  DISPOSITION OF SPECIMEN:  N/A  COUNTS:  YES  TOURNIQUET:   85 min at 250 mmHg  DICTATION: .Other Dictation: Dictation Number [id number]  PLAN OF CARE: Admit to inpatient   PATIENT DISPOSITION:  PACU - hemodynamically stable.   Delay start of Pharmacological VTE agent (>24hrs) due to surgical blood loss or risk of bleeding: no

## 2016-06-09 NOTE — Transfer of Care (Signed)
Immediate Anesthesia Transfer of Care Note  Patient: Juan Wiggins  Procedure(s) Performed: Procedure(s): Removal of antibiotic spacer and reimplantation of total knee arthroplasty (Right)  Patient Location:   Anesthesia Type:gen  Level of Consciousness: awake, alert  and patient cooperative  Airway & Oxygen Therapy: Patient Spontanous Breathing and Patient connected to face mask oxygen  Post-op Assessment: Post -op Vital signs reviewed and stable  Post vital signs: Reviewed  Last Vitals:  Vitals:   06/09/16 1006 06/09/16 1008  BP:    Pulse: 80 80  Resp:    Temp:      Last Pain:  Vitals:   06/09/16 0816  TempSrc: Oral      Patients Stated Pain Goal: 3 (97/91/50 4136)  Complications: No apparent anesthesia complications

## 2016-06-09 NOTE — Anesthesia Preprocedure Evaluation (Signed)
Anesthesia Evaluation  Patient identified by MRN, date of birth, ID band Patient awake    Reviewed: Allergy & Precautions, NPO status , Patient's Chart, lab work & pertinent test results  History of Anesthesia Complications (+) Family history of anesthesia reaction  Airway Mallampati: II  TM Distance: >3 FB Neck ROM: Full    Dental no notable dental hx. (+) Teeth Intact   Pulmonary sleep apnea and Continuous Positive Airway Pressure Ventilation , pneumonia, resolved, former smoker,  Non compliant with CPAP   Pulmonary exam normal breath sounds clear to auscultation       Cardiovascular hypertension, Pt. 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 Hx/o Melanoma excised from right shoulder Hx/o Chronic LBP   Abdominal   Peds  Hematology  (+) anemia ,   Anesthesia Other Findings   Reproductive/Obstetrics                             Anesthesia Physical Anesthesia Plan  ASA: III  Anesthesia Plan: Spinal and Regional   Post-op Pain Management:  Regional for Post-op pain   Induction: Intravenous  Airway Management Planned: Natural Airway and Simple Face Mask  Additional Equipment:   Intra-op Plan:   Post-operative Plan:   Informed Consent: I have reviewed the patients History and Physical, chart, labs and discussed the procedure including the risks, benefits and alternatives for the proposed anesthesia with  the patient or authorized representative who has indicated his/her understanding and acceptance.   Dental advisory given  Plan Discussed with: CRNA, Anesthesiologist and Surgeon  Anesthesia Plan Comments:         Anesthesia Quick Evaluation

## 2016-06-09 NOTE — Op Note (Signed)
NAME:  Juan Wiggins, Juan Wiggins NO.:  [id number]  MEDICAL RECORD NO.:  [id number]  LOCATION:                                 FACILITY:  PHYSICIAN:  Pietro Cassis. Alvan Dame, M.D.  DATE OF BIRTH:  [birth year]  DATE OF PROCEDURE:  06/09/2016 DATE OF DISCHARGE:                              OPERATIVE REPORT   PREOPERATIVE DIAGNOSIS:  Status post resection of infected total knee arthroplasty, placement of antibiotic spacer.  POSTOPERATIVE DIAGNOSIS:  Status post resection of infected total knee arthroplasty, placement of antibiotic spacer.  PROCEDURES: 1. Removal of antibiotic spacer. 2. Revision/reimplantation, right total knee replacement.  COMPONENTS USED:  The DePuy knee System with a size 38 patellar button, a size 3 MBT revision tray with a 13 x 30 stem, a 29 mm cemented sleeve, 5 mm medial and lateral augments.  The femoral side was a size 4 standard femur, an 8 mm distal medial augment, a 34 press-fit sleeve, and a 13 x 30 stem with a +2 adapter, and 5-degree bolt.  SURGEON:  Pietro Cassis. Alvan Dame, M.D.  ASSISTANT:  Danae Orleans, PA-C.  Noted that, [name] was present for the entirety of the case from preoperative position, perioperative management of the operative extremity, general facilitation of the case and primary wound closure.  ANESTHESIA:  General.  SPECIMENS:  Upon entering the joint, the patient was noted to have a clear seromatous fluid.  No concern for infection.  DRAINS:  None.  COMPLICATIONS:  None.  TOURNIQUET TIME:  85 minutes at 250 mmHg.  BLOOD LOSS:  Probably 400 mL or so after tourniquet was let down.  INDICATIONS FOR PROCEDURE:  [name] is a 66 year old male with history of right total knee arthroplasty, complicated by infection.  He had required multiple operations with the most recent revision of a resection, placement of articulating spacer, removal of patellar button.  In the clinical postoperative period, he noted significant  improvement in his overall symptoms.  Even after antibiotics had been stopped, he had no recurrence of pain or swelling.  Attempted aspiration in the office was negative.  We, at this point, elected to proceed with total knee reimplantation. Risks of recurrent infection, DVT, component failure, need for future surgery, and postop course were reviewed and expectations set.  Consent was obtained.  PROCEDURE IN DETAIL:  The patient was brought to the operative theater. Once adequate anesthesia and preoperative antibiotics, vancomycin administered.  He was positioned supine with a right thigh tourniquet placed.  The right lower extremity was then prepped and draped in sterile fashion utilizing the [facility] leg holder.  A time-out was performed, identifying the patient, planned procedure, and extremity. Leg was exsanguinated.  Tourniquet elevated to 250 mmHg.  His old incision was incised.  Soft tissue planes created through the scarring down to the extensor mechanism.  The patient did have some old hematoma in this prepatellar space.  A medial arthrotomy was made.  Once I got into the joint space, I encountered a very clear seromatous tissue without any evidence of hematoma or infection.  Following initial exposure of the joint, including medial and lateral gutter recreation, synovectomy and scar debridement, the knee  was exposed without having to do a quadriceps snip or anything else distally.  I did put a smooth pin in the tibial tubercle.  At this point, the previously placed articulating antibiotic spacer mold was removed using osteotomes.  At this point, further debridement of the canals was carried out.  At this point, I prepared the tibia and the femur for revision components.  I elected to use a 29 cemented sleeve to provide cement into the proximal tibia with a 13 stem and 5 mm augments.  On the femoral side, after initial trial reduction with the attempted cemented  sleeve, I needed further augmentation due to the significant loss of bone on the lateral side of his distal femur.  For this reason, I used a press-fit sleeves.  We ended up broaching to the 34 press-fit sleeve in order to restore the distal femoral length.  I did place a 13 x 30 cemented stem at the end of this for canal stabilization.  Following the preparation of the proximal tibia and distal femur and identifying through a trial reduction with 12.5 insert, the knee came to full extension and was stable in flexion.  I did redo the patella, removing the posterior bone and then used a 38 patellar button with lug holes drilled.  Once all this was complete, I was satisfied with the procedure.  The knee was irrigated with normal saline solution throughout the knee, but also inside the canals with the canal brush irrigator.  Cement restrictors were placed in the appropriate depth.  Final components were opened and configured on the back table under my direct supervision and assistance.  Once this was done, cement was mixed.  The tibial component was cemented into place.  I did place some cement into the distal aspect of the femur and then covered the entire component, particularly the lateral side of the femur with the cement.  This was gentamicin-impregnated cement.  The femoral component was impacted on.  There was noted to be a cortical split anteriorly.  It did not propagate beyond 2 cm.  Following this, the patella was cemented into place.  The knee was brought to extension with a 12.5 insert to allow the cement to cure. Once the cement had cured, excessive cement was removed throughout the knee.  I selected the 12.5 insert to match the 4 femur.  This was then placed in the tibia.  The knee was irrigated throughout the case. Again, at this point, the tourniquet had been let down after 85 minutes. The extensor mechanism was then reapproximated using #1 Vicryl and multiple  interrupted figure-of-eight sutures followed by the use of a 0 Stratafix to over sew this area.  The remaining of the wound was closed with 2-0 Vicryl and then a running 3-0 Monocryl.  The knee was then cleaned, dried, and dressed sterilely using surgical glue and Aquacel dressing.  The patient was brought to the recovery room in stable condition, tolerating the procedure well.  We will allow him to be weightbearing as tolerated.  I will have a CPM while he is here.  We will then work on postoperative motion.  He is a high-risk patient.  I will try to keep him on antibiotics orally for 3- to 63-month period of time to make certain that we are free of any recurring infection.     Pietro Cassis Alvan Dame, M.D.     MDO/MEDQ  D:  06/09/2016  T:  06/09/2016  Job:  [id number]

## 2016-06-09 NOTE — Anesthesia Procedure Notes (Signed)
Anesthesia Regional Block: Adductor canal block   Pre-Anesthetic Checklist: ,, timeout performed, Correct Patient, Correct Site, Correct Laterality, Correct Procedure, Correct Position, site marked, Risks and benefits discussed,  Surgical consent,  Pre-op evaluation,  At surgeon's request and post-op pain management  Laterality: Right  Prep: chloraprep       Needles:  Injection technique: Single-shot  Needle Type: Echogenic Stimulator Needle     Needle Length: 9cm  Needle Gauge: 21   Needle insertion depth: 5 cm   Additional Needles:   Procedures: ultrasound guided,,,,,,,,  Narrative:  Start time: 06/09/2016 9:18 AM End time: 06/09/2016 9:24 AM Injection made incrementally with aspirations every 5 mL.  Performed by: Personally  Anesthesiologist: Josephine Igo  Additional Notes: Timeout performed. Patient sedated. Relevant anatomy ID'd using [place]. Incremental 2-68ml injection with frequent aspiration. Patient tolerated procedure well.

## 2016-06-09 NOTE — Interval H&P Note (Signed)
History and Physical Interval Note:  06/09/2016 9:12 AM  Juan Wiggins  has presented today for surgery, with the diagnosis of Status post Right total knee resection and placement of antibiotic spacer  The various methods of treatment have been discussed with the patient and family. After consideration of risks, benefits and other options for treatment, the patient has consented to  Procedure(s): Removal of antibiotic spacer and reimplantation of total knee arthroplasty (Right) as a surgical intervention .  The patient's history has been reviewed, patient examined, no change in status, stable for surgery.  I have reviewed the patient's chart and labs.  Questions were answered to the patient's satisfaction.     Mauri Pole

## 2016-06-10 ENCOUNTER — Encounter (HOSPITAL_COMMUNITY): Payer: Self-pay | Admitting: Orthopedic Surgery

## 2016-06-10 DIAGNOSIS — M25561 Pain in right knee: Secondary | ICD-10-CM | POA: Diagnosis present

## 2016-06-10 DIAGNOSIS — B1920 Unspecified viral hepatitis C without hepatic coma: Secondary | ICD-10-CM | POA: Diagnosis present

## 2016-06-10 DIAGNOSIS — M21961 Unspecified acquired deformity of right lower leg: Secondary | ICD-10-CM | POA: Diagnosis present

## 2016-06-10 DIAGNOSIS — I10 Essential (primary) hypertension: Secondary | ICD-10-CM | POA: Diagnosis present

## 2016-06-10 DIAGNOSIS — F329 Major depressive disorder, single episode, unspecified: Secondary | ICD-10-CM | POA: Diagnosis present

## 2016-06-10 DIAGNOSIS — Z87891 Personal history of nicotine dependence: Secondary | ICD-10-CM | POA: Diagnosis not present

## 2016-06-10 DIAGNOSIS — Z85820 Personal history of malignant melanoma of skin: Secondary | ICD-10-CM | POA: Diagnosis not present

## 2016-06-10 DIAGNOSIS — Z7982 Long term (current) use of aspirin: Secondary | ICD-10-CM | POA: Diagnosis not present

## 2016-06-10 DIAGNOSIS — Z79899 Other long term (current) drug therapy: Secondary | ICD-10-CM | POA: Diagnosis not present

## 2016-06-10 DIAGNOSIS — Z96652 Presence of left artificial knee joint: Secondary | ICD-10-CM | POA: Diagnosis present

## 2016-06-10 DIAGNOSIS — G4730 Sleep apnea, unspecified: Secondary | ICD-10-CM | POA: Diagnosis present

## 2016-06-10 DIAGNOSIS — Z96651 Presence of right artificial knee joint: Secondary | ICD-10-CM

## 2016-06-10 DIAGNOSIS — E039 Hypothyroidism, unspecified: Secondary | ICD-10-CM | POA: Diagnosis present

## 2016-06-10 LAB — CBC
HCT: 36 % — ABNORMAL LOW (ref 39.0–52.0)
Hemoglobin: 11.9 g/dL — ABNORMAL LOW (ref 13.0–17.0)
MCH: 31.4 pg (ref 26.0–34.0)
MCHC: 33.1 g/dL (ref 30.0–36.0)
MCV: 95 fL (ref 78.0–100.0)
PLATELETS: 267 10*3/uL (ref 150–400)
RBC: 3.79 MIL/uL — AB (ref 4.22–5.81)
RDW: 16 % — ABNORMAL HIGH (ref 11.5–15.5)
WBC: 17.5 10*3/uL — ABNORMAL HIGH (ref 4.0–10.5)

## 2016-06-10 LAB — BASIC METABOLIC PANEL
Anion gap: 3 — ABNORMAL LOW (ref 5–15)
BUN: 20 mg/dL (ref 6–20)
CO2: 25 mmol/L (ref 22–32)
CREATININE: 2.02 mg/dL — AB (ref 0.61–1.24)
Calcium: 8.8 mg/dL — ABNORMAL LOW (ref 8.9–10.3)
Chloride: 106 mmol/L (ref 101–111)
GFR calc Af Amer: 39 mL/min — ABNORMAL LOW (ref >60)
GFR, EST NON AFRICAN AMERICAN: 33 mL/min — AB (ref >60)
Glucose, Bld: 173 mg/dL — ABNORMAL HIGH (ref 65–99)
POTASSIUM: 5.1 mmol/L (ref 3.5–5.1)
SODIUM: 134 mmol/L — AB (ref 135–145)

## 2016-06-10 NOTE — Evaluation (Signed)
Physical Therapy Evaluation Patient Details Name: Juan Wiggins MRN: [id number] DOB: 1950-08-29 Today's Date: 06/10/2016   History of Present Illness  66 yo male s/p Removal of antibiotic spacer and reimplantation of TKA PMHx (previous TKA and spacer per Dr Gladstone Lighter, second spacer per Dr. Alvan Dame);Hep B & C, drug abuse, chronic pain, bil TKAs, MDD, anxiety  Clinical Impression    Pt is s/p TKA resulting in the deficits listed below (see PT Problem List). * Pt will benefit from skilled PT to increase their independence and safety with mobility to allow discharge to the venue listed below. Pt should progress well;  Amb 49' with crutches; knee flexion to grossly 45* AAROM;    Follow Up Recommendations Home health PT    Equipment Recommendations  None recommended by PT    Recommendations for Other Services       Precautions / Restrictions Precautions Precautions: Fall;Knee Restrictions Other Position/Activity Restrictions: WBAT      Mobility  Bed Mobility Overal bed mobility: Needs Assistance Bed Mobility: Supine to Sit     Supine to sit: Supervision     General bed mobility comments: for safety  Transfers Overall transfer level: Needs assistance Equipment used: Crutches Transfers: Sit to/from Stand Sit to Stand: Min guard         General transfer comment: for safety to rise and stabilize  Ambulation/Gait Ambulation/Gait assistance: Min guard Ambulation Distance (Feet): 90 Feet Assistive device: Crutches Gait Pattern/deviations: Step-to pattern;Step-through pattern;Decreased weight shift to right     General Gait Details: cues for foot flat on R, incr step length  Stairs            Wheelchair Mobility    Modified Rankin (Stroke Patients Only)       Balance                                             Pertinent Vitals/Pain Pain Assessment: 0-10 Pain Score: 6  Pain Location: right knee Pain Descriptors / Indicators:  Constant;Dull;Sore Pain Intervention(s): Limited activity within patient's tolerance;Monitored during session;Premedicated before session;Repositioned;Ice applied    Home Living Family/patient expects to be discharged to:: Private residence   Available Help at Discharge: Family;Available 24 hours/day Type of Home: House Home Access: Stairs to enter Entrance Stairs-Rails: Can reach both Entrance Stairs-Number of Steps: 5 Home Layout: Two level Home Equipment: Walker - 2 wheels;Cane - single point;Bedside commode;Shower seat;Hand held shower head;Wheelchair - manual;Crutches      Prior Function Level of Independence: Independent with assistive device(s)         Comments: amb with crutches, w/c for longer distances     Hand Dominance   Dominant Hand: Right    Extremity/Trunk Assessment   Upper Extremity Assessment Upper Extremity Assessment: Overall WFL for tasks assessed    Lower Extremity Assessment Lower Extremity Assessment: RLE deficits/detail RLE Deficits / Details: AAROM knee flexion ~ 10 to 45*; hip flexion and knee extension 3-/5       Communication   Communication: No difficulties  Cognition Arousal/Alertness: Awake/alert Behavior During Therapy: WFL for tasks assessed/performed Overall Cognitive Status: Within Functional Limits for tasks assessed                                        General Comments  Exercises Total Joint Exercises Ankle Circles/Pumps: AROM;Both;10 reps Quad Sets: AROM;5 reps;Both Heel Slides: AAROM;Right;5 reps Straight Leg Raises: AROM;5 reps;Strengthening;Right   Assessment/Plan    PT Assessment Patient needs continued PT services  PT Problem List Decreased strength;Decreased range of motion;Decreased activity tolerance;Decreased mobility;Pain       PT Treatment Interventions Functional mobility training;DME instruction;Gait training;Therapeutic activities;Therapeutic exercise;Patient/family  education;Stair training    PT Goals (Current goals can be found in the Care Plan section)  Acute Rehab PT Goals Patient Stated Goal: knee healed, back to normal PT Goal Formulation: With patient Time For Goal Achievement: 06/17/16 Potential to Achieve Goals: Good    Frequency 7X/week   Barriers to discharge        Co-evaluation               End of Session   Activity Tolerance: Patient tolerated treatment well Patient left: in chair;with call bell/phone within reach   PT Visit Diagnosis: Difficulty in walking, not elsewhere classified (R26.2)    Time: 1100-1118 PT Time Calculation (min) (ACUTE ONLY): 18 min   Charges:   PT Evaluation $PT Eval Low Complexity: 1 Procedure     PT G Codes:   PT G-Codes **NOT FOR INPATIENT CLASS** Functional Assessment Tool Used: AM-PAC 6 Clicks Basic Mobility;Clinical judgement Functional Limitation: Mobility: Walking and moving around Mobility: Walking and Moving Around Current Status (E9407): At least 1 percent but less than 20 percent impaired, limited or restricted Mobility: Walking and Moving Around Goal Status [phone]): At least 1 percent but less than 20 percent impaired, limited or restricted    [name], PT Pager: [phone] 06/10/2016   [facility] 06/10/2016, 11:38 AM

## 2016-06-10 NOTE — Progress Notes (Signed)
OT Cancellation Note  Patient Details Name: ADNAN VANVOORHIS MRN: [id number] DOB: 08-12-1950   Cancelled Treatment:    Reason Eval/Treat Not Completed: PT screened, no needs identified, will sign off  Berdina Cheever 06/10/2016, 11:28 AM  Lesle Chris, OTR/L [phone] 06/10/2016

## 2016-06-10 NOTE — Progress Notes (Signed)
Discharge plan: Pt for HHPT with [facility]. Has used them previously. Rep aware. Pt requesting crutches. RN to call ortho tech. Marney Doctor RN,BSN,NCM

## 2016-06-10 NOTE — Progress Notes (Signed)
     Subjective: 1 Day Post-Op Procedure(s) (LRB): Removal of antibiotic spacer and reimplantation of total knee arthroplasty (Right)   Patient reports pain as mild, pain controlled. States that the knee feels much better than it has in a long time.  No events throughout the night. Ready to work with PT and progress.  Due to the amount of time with the antibiotic spacer he has some weakness. He is stay to work with PT for ambulation safety and assistance.  Objective:   VITALS:   Vitals:   06/10/16 0919 06/10/16 1415  BP: (!) 119/59 (!) 124/51  Pulse: 79 68  Resp: 12 14  Temp: 98.3 F (36.8 C) 98.3 F (36.8 C)    Dorsiflexion/Plantar flexion intact Incision: dressing C/D/I No cellulitis present Compartment soft  LABS  Recent Labs  06/10/16 0429  HGB 11.9*  HCT 36.0*  WBC 17.5*  PLT 267     Recent Labs  06/10/16 0429  NA 134*  K 5.1  BUN 20  CREATININE 2.02*  GLUCOSE 173*     Assessment/Plan: 1 Day Post-Op Procedure(s) (LRB): Removal of antibiotic spacer and reimplantation of total knee arthroplasty (Right) Foley cath d/c'ed Advance diet Up with therapy D/C IV fluids Discharge home when ready    Juan Wiggins. Tomothy Eddins   PAC  06/10/2016, 5:00 PM

## 2016-06-10 NOTE — Progress Notes (Signed)
Physical Therapy Treatment Patient Details Name: Juan Wiggins MRN: [id number] DOB: 12-06-1950 Today's Date: 06/10/2016    History of Present Illness 66 yo male s/p Removal of antibiotic spacer and reimplantation of TKA PMHx (previous TKA and spacer per Dr Gladstone Lighter, second spacer per Dr. Alvan Dame);Hep B & C, drug abuse, chronic pain, bil TKAs, MDD, anxiety    PT Comments    Pt progressing well, will see in am, pt is hopeful to D/C tomorrow; has stairs but is familiar with techniques with crutches  Follow Up Recommendations  Home health PT     Equipment Recommendations  None recommended by PT    Recommendations for Other Services       Precautions / Restrictions Precautions Precautions: Fall;Knee Restrictions Other Position/Activity Restrictions: WBAT    Mobility  Bed Mobility Overal bed mobility: Needs Assistance Bed Mobility: Sit to Supine     Supine to sit: Supervision Sit to supine: Supervision   General bed mobility comments: for safety  Transfers Overall transfer level: Needs assistance Equipment used: Crutches Transfers: Sit to/from Stand Sit to Stand: Min guard         General transfer comment: for safety to rise and stabilize  Ambulation/Gait Ambulation/Gait assistance: Min guard Ambulation Distance (Feet): 280 Feet Assistive device: Crutches Gait Pattern/deviations: Step-to pattern;Step-through pattern     General Gait Details: cues for foot flat on R, incr step length   Stairs            Wheelchair Mobility    Modified Rankin (Stroke Patients Only)       Balance                                            Cognition Arousal/Alertness: Awake/alert Behavior During Therapy: WFL for tasks assessed/performed Overall Cognitive Status: Within Functional Limits for tasks assessed                                        Exercises Total Joint Exercises Ankle Circles/Pumps: AROM;Both;5 reps Quad Sets:  AROM;5 reps;Both Heel Slides: AAROM;Right;10 reps;AROM Straight Leg Raises: AROM;Strengthening;Right;10 reps Goniometric ROM: ~ right knee flexion 6* to 70*    General Comments        Pertinent Vitals/Pain Pain Assessment: 0-10 Pain Score: 5  Pain Location: right knee Pain Descriptors / Indicators: Constant;Dull;Sore Pain Intervention(s): Limited activity within patient's tolerance;Monitored during session;Premedicated before session    Home Living Family/patient expects to be discharged to:: Private residence   Available Help at Discharge: Family;Available 24 hours/day Type of Home: House Home Access: Stairs to enter Entrance Stairs-Rails: Can reach both Home Layout: Two level Home Equipment: Walker - 2 wheels;Cane - single point;Bedside commode;Shower seat;Hand held shower head;Wheelchair - manual;Crutches      Prior Function Level of Independence: Independent with assistive device(s)      Comments: amb with crutches, w/c for longer distances   PT Goals (current goals can now be found in the care plan section) Acute Rehab PT Goals Patient Stated Goal: knee healed, back to normal PT Goal Formulation: With patient Time For Goal Achievement: 06/17/16 Potential to Achieve Goals: Good Progress towards PT goals: Progressing toward goals    Frequency    7X/week      PT Plan Current plan remains appropriate    Co-evaluation  End of Session   Activity Tolerance: Patient tolerated treatment well Patient left: in bed;with call bell/phone within reach   PT Visit Diagnosis: Difficulty in walking, not elsewhere classified (R26.2)     Time: 1422-1440 PT Time Calculation (min) (ACUTE ONLY): 18 min  Charges:  $Gait Training: 8-22 mins                    G Codes:  Functional Assessment Tool Used: AM-PAC 6 Clicks Basic Mobility;Clinical judgement Functional Limitation: Mobility: Walking and moving around Mobility: Walking and Moving Around Current  Status ([id number]): At least 1 percent but less than 20 percent impaired, limited or restricted Mobility: Walking and Moving Around Goal Status [phone]): At least 1 percent but less than 20 percent impaired, limited or restricted    [name], PT Pager: [phone] 06/10/2016    [facility] 06/10/2016, 2:45 PM

## 2016-06-11 LAB — BASIC METABOLIC PANEL
Anion gap: 5 (ref 5–15)
BUN: 24 mg/dL — ABNORMAL HIGH (ref 6–20)
CALCIUM: 8.8 mg/dL — AB (ref 8.9–10.3)
CO2: 24 mmol/L (ref 22–32)
Chloride: 107 mmol/L (ref 101–111)
Creatinine, Ser: 2.04 mg/dL — ABNORMAL HIGH (ref 0.61–1.24)
GFR, EST AFRICAN AMERICAN: 38 mL/min — AB (ref >60)
GFR, EST NON AFRICAN AMERICAN: 33 mL/min — AB (ref >60)
Glucose, Bld: 138 mg/dL — ABNORMAL HIGH (ref 65–99)
Potassium: 5 mmol/L (ref 3.5–5.1)
SODIUM: 136 mmol/L (ref 135–145)

## 2016-06-11 LAB — CBC
HEMATOCRIT: 33.2 % — AB (ref 39.0–52.0)
Hemoglobin: 11.1 g/dL — ABNORMAL LOW (ref 13.0–17.0)
MCH: 31.9 pg (ref 26.0–34.0)
MCHC: 33.4 g/dL (ref 30.0–36.0)
MCV: 95.4 fL (ref 78.0–100.0)
PLATELETS: 289 10*3/uL (ref 150–400)
RBC: 3.48 MIL/uL — ABNORMAL LOW (ref 4.22–5.81)
RDW: 16.6 % — AB (ref 11.5–15.5)
WBC: 20.6 10*3/uL — ABNORMAL HIGH (ref 4.0–10.5)

## 2016-06-11 MED ORDER — OXYCODONE HCL 10 MG PO TABS: 10.0000 mg | ORAL_TABLET | ORAL | 0 refills | Status: AC

## 2016-06-11 MED ORDER — METHOCARBAMOL 500 MG PO TABS: 500.0000 mg | ORAL_TABLET | Freq: Four times a day (QID) | ORAL | 0 refills | Status: DC | PRN

## 2016-06-11 MED ORDER — ACETAMINOPHEN 500 MG PO TABS: 1000.0000 mg | ORAL_TABLET | Freq: Three times a day (TID) | ORAL | 0 refills | Status: DC

## 2016-06-11 MED ORDER — DOXYCYCLINE HYCLATE 100 MG PO TABS: 100.0000 mg | ORAL_TABLET | Freq: Two times a day (BID) | ORAL | 2 refills | Status: DC

## 2016-06-11 MED ORDER — DOCUSATE SODIUM 100 MG PO CAPS: 100.0000 mg | ORAL_CAPSULE | Freq: Two times a day (BID) | ORAL | 0 refills | Status: DC

## 2016-06-11 NOTE — Progress Notes (Signed)
Pt to d/c home with [facility] for PT. Home DME (crutches) in room prior to d/c. AVS reviewed and "My Chart" discussed with pt. Pt capable of verbalizing medications, signs and symptoms of infection, and follow-up appointments. Remains hemodynamically stable. No signs and symptoms of distress. Educated pt to return to ER in the case of SOB, dizziness, or chest pain.

## 2016-06-11 NOTE — Progress Notes (Signed)
     Subjective: 2 Days Post-Op Procedure(s) (LRB): Removal of antibiotic spacer and reimplantation of total knee arthroplasty (Right)   Patient reports pain as mild, pain controlled. Feels the knee is better prior to surgery.  Ready to be discharged home.   Objective:   VITALS:   Vitals:   06/10/16 2144 06/11/16 0612  BP: 131/68 132/72  Pulse: 63 65  Resp: 15 16  Temp: 98.1 F (36.7 C) 98.2 F (36.8 C)    Dorsiflexion/Plantar flexion intact Incision: dressing C/D/I No cellulitis present Compartment soft  LABS  Recent Labs  06/10/16 0429 06/11/16 0432  HGB 11.9* 11.1*  HCT 36.0* 33.2*  WBC 17.5* 20.6*  PLT 267 289     Recent Labs  06/10/16 0429 06/11/16 0432  NA 134* 136  K 5.1 5.0  BUN 20 24*  CREATININE 2.02* 2.04*  GLUCOSE 173* 138*     Assessment/Plan: 2 Days Post-Op Procedure(s) (LRB): Removal of antibiotic spacer and reimplantation of total knee arthroplasty (Right) Up with therapy Discharge home Follow up in 2 weeks at [facility]. Follow up with OLIN,Sheronica Corey D in 2 weeks.  Contact information:  [facility] [address] Juan Wiggins   PAC  06/11/2016, 12:32 PM

## 2016-06-11 NOTE — Progress Notes (Signed)
Physical Therapy Treatment Patient Details Name: Juan Wiggins MRN: [id number] DOB: 21-Sep-1950 Today's Date: 06/11/2016    History of Present Illness 66 yo male s/p Removal of antibiotic spacer and reimplantation of TKA PMHx (previous TKA and spacer per Dr Gladstone Lighter, second spacer per Dr. Alvan Dame);Hep B & C, drug abuse, chronic pain, bil TKAs, MDD, anxiety    PT Comments    Pt. Is ready for DC.    Follow Up Recommendations  Home health PT     Equipment Recommendations  None recommended by PT    Recommendations for Other Services       Precautions / Restrictions Precautions Precautions: Fall;Knee Restrictions Weight Bearing Restrictions: No    Mobility  Bed Mobility Overal bed mobility: Independent                Transfers Overall transfer level: Modified independent                  Ambulation/Gait Ambulation/Gait assistance: Modified independent (Device/Increase time) Ambulation Distance (Feet): 280 Feet Assistive device: Crutches Gait Pattern/deviations: Step-to pattern;Step-through pattern     General Gait Details: cues for foot flat on R, incr step length   Stairs            Wheelchair Mobility    Modified Rankin (Stroke Patients Only)       Balance                                            Cognition Arousal/Alertness: Awake/alert                                            Exercises Total Joint Exercises Ankle Circles/Pumps: AROM;Both;5 reps Quad Sets: AROM;5 reps;Both Short Arc Quad: AROM;Right;10 reps Heel Slides: AAROM;Right;10 reps;AROM Hip ABduction/ADduction: AROM;Right;10 reps Straight Leg Raises: AROM;Strengthening;Right;10 reps Goniometric ROM: rt knee flex5-90*    General Comments        Pertinent Vitals/Pain Pain Score: 4  Pain Location: right knee Pain Descriptors / Indicators: Constant;Dull;Sore Pain Intervention(s): Monitored during session;Premedicated before  session;Ice applied    Home Living                      Prior Function            PT Goals (current goals can now be found in the care plan section) Progress towards PT goals: Progressing toward goals    Frequency           PT Plan Current plan remains appropriate    Co-evaluation             End of Session   Activity Tolerance: Patient tolerated treatment well Patient left: in bed;with call bell/phone within reach Nurse Communication: Mobility status PT Visit Diagnosis: Difficulty in walking, not elsewhere classified (R26.2)     Time: 6295-2841 PT Time Calculation (min) (ACUTE ONLY): 28 min  Charges:  $Gait Training: 8-22 mins $Therapeutic Exercise: 8-22 mins                    G CodesTresa Endo PT [phone]  Claretha Cooper 06/11/2016, 1:24 PM

## 2016-06-15 NOTE — Discharge Summary (Signed)
Physician Discharge Summary  Patient ID: Juan Wiggins MRN: [id number] DOB/AGE: 64/09/1950 64 y.o.  Admit date: 06/09/2016 Discharge date: 06/11/2016   Procedures:  Procedure(s) (LRB): Removal of antibiotic spacer and reimplantation of total knee arthroplasty (Right)  Attending Physician:  Dr. Paralee Cancel   Admission Diagnoses:    Removal of antibiotic spacer and reimplantation of TKA  Discharge Diagnoses:  Principal Problem:   S/P right TKA Active Problems:   Status post right knee replacement  Past Medical History:  Diagnosis Date  . Acute renal failure (ARF) ([place]) 2017   TOOK HEMODIALYSIS X 5 WEEKS   . Anxiety   . Arthritis    RHEUMATOID ARTHRITIS; OA LEFT KNEE  . Cancer ([place])    MELANOMA REMOVED  LT SHOULDER  . Depression   . Family history of adverse reaction to anesthesia    MOTHER AND SISTER SLOW TO WAKE UP  . GERD (gastroesophageal reflux disease)    PREVACID IF NEEDED  . Heart murmur   . Hemorrhoids   . Hepatitis A    PT STATES TYPE OF HEPATITIS YOU GET FROM SHELLFISH  . Hepatitis B   . Hepatitis C   . Hypertension    OFF ALL MEDS LAST 2 MONTHS   . Hypothyroidism   . Inguinal hernia    RIGHT  . 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 Pancreatitis, alcoholic, acute 63/1497  . Pneumonia 01/02/2016  . Seizures ([place]) 2017   ONCE WHILE IN HOSPITAL NONE SINCE  . Septic arthritis ([place]) 01/14/2016   RT KNEE  . Sleep apnea    CLAUSTROPHOBIC - COULD NOT TOLERATE CPAP MASK  . Tachycardia    PT STATES HIS HEART RATE USUALLY 100 OR MORE    HPI:    Pt is a 66 y.o. male complaining of right pain.  Patient had a removal of TKA due to infection and an antibiotic spacer placed.  He has been on IV antibiotics and followed by Infectious Disease.  His labs have been good and he wishes to proceed with a reimplantation of the right TKA.  Various options are discussed with the patient including a new antibiotic spacer depending on  how the knee looks upon removing the antibiotic spacer.  Risks, benefits and expectations were discussed with the patient. Patient understand the risks, benefits and expectations and wishes to proceed with surgery.   PCP: Helane Rima, MD   Discharged Condition: good  Hospital Course:  Patient underwent the above stated procedure on 06/09/2016. Patient tolerated the procedure well and brought to the recovery room in good condition and subsequently to the floor.  POD #1 BP: 124/51 ; Pulse: 68 ; Temp: 98.3 F (36.8 C) ; Resp: 14 Patient reports pain as mild, pain controlled. States that the knee feels much better than it has in a long time.  No events throughout the night. Ready to work with PT and progress.  Due to the amount of time with the antibiotic spacer he has some weakness. He is stay to work with PT for ambulation safety and assistance. Dorsiflexion/plantar flexion intact, incision: dressing C/D/I, no cellulitis present and compartment soft.   LABS  Basename    HGB     11.9  HCT     36.0   POD #2  BP: 132/72 ; Pulse: 65 ; Temp: 98.2 F (36.8 C) ; Resp: 16 Patient reports pain as mild, pain controlled. Feels the knee is  better prior to surgery.  Ready to be discharged home.  Dorsiflexion/plantar flexion intact, incision: dressing C/D/I, no cellulitis present and compartment soft.   LABS  Basename    HGB     11.1  HCT     33.2    Discharge Exam: General appearance: alert, cooperative and no distress Extremities: Homans sign is negative, no sign of DVT, no edema, redness or tenderness in the calves or thighs and no ulcers, gangrene or trophic changes  Disposition: Home with follow up in 2 weeks   Follow-up Information    Mauri Pole, MD. Schedule an appointment as soon as possible for a visit in 2 week(s).   Specialty:  Orthopedic Surgery Contact information: [address] [place] [address] [phone]           Discharge Instructions     Call MD / Call 911    Complete by:  As directed    If you experience chest pain or shortness of breath, CALL 911 and be transported to the hospital emergency room.  If you develope a fever above 101 F, pus (white drainage) or increased drainage or redness at the wound, or calf pain, call your surgeon's office.   Change dressing    Complete by:  As directed    Maintain surgical dressing until follow up in the clinic. If the edges start to pull up, may reinforce with tape. If the dressing is no longer working, may remove and cover with gauze and tape, but must keep the area dry and clean.  Call with any questions or concerns.   Constipation Prevention    Complete by:  As directed    Drink plenty of fluids.  Prune juice may be helpful.  You may use a stool softener, such as Colace (over the counter) 100 mg twice a day.  Use MiraLax (over the counter) for constipation as needed.   Diet - low sodium heart healthy    Complete by:  As directed    Discharge instructions    Complete by:  As directed    Maintain surgical dressing until follow up in the clinic. If the edges start to pull up, may reinforce with tape. If the dressing is no longer working, may remove and cover with gauze and tape, but must keep the area dry and clean.  [facility]. Call with any questions or concerns.   Increase activity slowly as tolerated    Complete by:  As directed    Weight bearing as tolerated with assist device (walker, cane, etc) as directed, use it as long as suggested by your surgeon or therapist, typically at least 4-6 weeks.   TED hose    Complete by:  As directed    Use stockings (TED hose) for 2 weeks on both leg(s).  You may remove them at night for sleeping.      Allergies as of 06/11/2016      Reactions   Cefazolin Other (See Comments)   Patient had a seizure while on cefazolin and no other core cause could be identified other than the beta lactam   Heparin Other  (See Comments)   Low platelets (130s), no HIT testing performed, platelets recovered.   Wellbutrin [bupropion] Other (See Comments)   Hallucinations-started on 150mg  daily dosage      Medication List    STOP taking these medications   ibuprofen 200 MG tablet Commonly known as:  ADVIL,MOTRIN  TAKE these medications   acetaminophen 500 MG tablet Commonly known as:  TYLENOL Take 2 tablets (1,000 mg total) by mouth every 8 (eight) hours.   albuterol 108 (90 Base) MCG/ACT inhaler Commonly known as:  PROVENTIL HFA;VENTOLIN HFA Inhale 2-4 puffs into the lungs daily as needed for shortness of breath.   ALPRAZolam 0.25 MG tablet Commonly known as:  XANAX Take 0.125 mg by mouth at bedtime as needed for sleep.   aspirin 325 MG tablet Take 325 mg by mouth 2 (two) times daily.   bisacodyl 5 MG EC tablet Commonly known as:  DULCOLAX Take 5 mg by mouth 2 (two) times daily as needed (for constipation.).   cholecalciferol 1000 units tablet Commonly known as:  VITAMIN D Take 1,000 Units by mouth daily.   docusate sodium 100 MG capsule Commonly known as:  COLACE Take 1 capsule (100 mg total) by mouth 2 (two) times daily.   doxycycline 100 MG tablet Commonly known as:  VIBRA-TABS Take 1 tablet (100 mg total) by mouth every 12 (twelve) hours.   ferrous sulfate 325 (65 FE) MG tablet Take 1 tablet (325 mg total) by mouth 3 (three) times daily after meals.   gabapentin 300 MG capsule Commonly known as:  NEURONTIN Take 300 mg by mouth every 6 (six) hours as needed (for bone/joint/muscle pain.).   hydrOXYzine 50 MG capsule Commonly known as:  VISTARIL Take 2 capsules (100 mg total) by mouth at bedtime. For sleep   levothyroxine 175 MCG tablet Commonly known as:  SYNTHROID, LEVOTHROID Take 175 mcg by mouth daily at 2 am. 0300   loperamide 2 MG capsule Commonly known as:  IMODIUM Take 2-4 mg by mouth 3 (three) times daily as needed for diarrhea or loose stools.   methocarbamol  500 MG tablet Commonly known as:  ROBAXIN Take 1 tablet (500 mg total) by mouth every 6 (six) hours as needed for muscle spasms.   ondansetron 4 MG tablet Commonly known as:  ZOFRAN TAKE 1 TABLET BY MOUTH EVERY 8 HOURS AS NEEDED FOR NAUSEA OR VOMITING.   Oxycodone HCl 10 MG Tabs Take 1-3 tablets (10-30 mg total) by mouth every 4 (four) hours. What changed:  how much to take  when to take this  reasons to take this   polyethylene glycol packet Commonly known as:  MIRALAX / GLYCOLAX Take 17 g by mouth 2 (two) times daily. What changed:  when to take this  reasons to take this   promethazine 25 MG tablet Commonly known as:  PHENERGAN Take 25 mg by mouth every 6 (six) hours as needed for nausea or vomiting.   RAPAFLO 8 MG Caps capsule Generic drug:  silodosin Take 8 mg by mouth daily with breakfast.   senna 8.6 MG Tabs tablet Commonly known as:  SENOKOT Take 1 tablet by mouth 2 (two) times daily as needed for mild constipation.   tenofovir 300 MG tablet Commonly known as:  VIREAD Take 1 tablet (300 mg total) by mouth daily.   testosterone cypionate 200 MG/ML injection Commonly known as:  DEPOTESTOSTERONE CYPIONATE Inject 100 mg into the muscle every 14 (fourteen) days.   valACYclovir 1000 MG tablet Commonly known as:  VALTREX Take 1 tablet (1,000 mg total) by mouth daily.   venlafaxine XR 37.5 MG 24 hr capsule Commonly known as:  EFFEXOR XR Take 1 capsule (37.5 mg total) by mouth daily.   zolpidem 10 MG tablet Commonly known as:  AMBIEN Take 1/2 to 1 tablet by mouth  as needed at bedtime What changed:  how much to take  how to take this  when to take this  additional instructions        Signed: [place] Pugh. Daimion Adamcik   PA-C  06/15/2016, 10:04 AM

## 2016-06-24 ENCOUNTER — Ambulatory Visit (INDEPENDENT_AMBULATORY_CARE_PROVIDER_SITE_OTHER): Payer: 59 | Admitting: Internal Medicine

## 2016-06-24 ENCOUNTER — Encounter: Payer: Self-pay | Admitting: Internal Medicine

## 2016-06-24 VITALS — BP 105/68 | HR 105 | Temp 97.9°F | Ht 71.0 in | Wt 184.0 lb

## 2016-06-24 DIAGNOSIS — N183 Chronic kidney disease, stage 3 unspecified: Secondary | ICD-10-CM

## 2016-06-24 DIAGNOSIS — B180 Chronic viral hepatitis B with delta-agent: Secondary | ICD-10-CM

## 2016-06-24 DIAGNOSIS — W1830XA Fall on same level, unspecified, initial encounter: Secondary | ICD-10-CM | POA: Diagnosis not present

## 2016-06-24 DIAGNOSIS — T8450XS Infection and inflammatory reaction due to unspecified internal joint prosthesis, sequela: Secondary | ICD-10-CM

## 2016-06-24 DIAGNOSIS — B009 Herpesviral infection, unspecified: Secondary | ICD-10-CM | POA: Diagnosis not present

## 2016-06-24 LAB — BASIC METABOLIC PANEL
BUN: 12 mg/dL (ref 7–25)
CHLORIDE: 107 mmol/L (ref 98–110)
CO2: 25 mmol/L (ref 20–31)
CREATININE: 1.95 mg/dL — AB (ref 0.70–1.25)
Calcium: 9.1 mg/dL (ref 8.6–10.3)
Glucose, Bld: 88 mg/dL (ref 65–99)
Potassium: 4.8 mmol/L (ref 3.5–5.3)
Sodium: 140 mmol/L (ref 135–146)

## 2016-06-24 MED ORDER — TENOFOVIR ALAFENAMIDE FUMARATE 25 MG PO TABS: 25.0000 mg | ORAL_TABLET | Freq: Every day | ORAL | 11 refills | Status: DC

## 2016-06-24 NOTE — Progress Notes (Signed)
RFV: follow up for prosthetic joint infection  Patient ID: Juan Wiggins, male   DOB: 1950/03/07, 66 y.o.   MRN: [id number]  HPI Juan Wiggins is a 66yo M with chronic hep B, treated Hep C who has had protracted case of MSSA pji of right knee. He underwent removal of abtx spacer and reimplantation of TKA on 4/17. Had a fall 5 days ago where he has noticed increasing swelling of his right knee again where previously it was smaller in size. Some associated warmth in addition to echymosis on medial aspect of thigh proximal to implant.no fevers, or chills  Outpatient Encounter Prescriptions as of 06/24/2016  Medication Sig  . acetaminophen (TYLENOL) 500 MG tablet Take 2 tablets (1,000 mg total) by mouth every 8 (eight) hours.  . ALPRAZolam (XANAX) 0.25 MG tablet Take 0.125 mg by mouth at bedtime as needed for sleep.  Marland Kitchen aspirin 325 MG tablet Take 325 mg by mouth 2 (two) times daily.  . bisacodyl (DULCOLAX) 5 MG EC tablet Take 5 mg by mouth 2 (two) times daily as needed (for constipation.).  Marland Kitchen cholecalciferol (VITAMIN D) 1000 units tablet Take 1,000 Units by mouth daily.  Marland Kitchen docusate sodium (COLACE) 100 MG capsule Take 1 capsule (100 mg total) by mouth 2 (two) times daily.  Marland Kitchen doxycycline (VIBRA-TABS) 100 MG tablet Take 1 tablet (100 mg total) by mouth every 12 (twelve) hours.  . gabapentin (NEURONTIN) 300 MG capsule Take 300 mg by mouth every 6 (six) hours as needed (for bone/joint/muscle pain.).  Marland Kitchen hydrOXYzine (VISTARIL) 50 MG capsule Take 2 capsules (100 mg total) by mouth at bedtime. For sleep  . levothyroxine (SYNTHROID, LEVOTHROID) 175 MCG tablet Take 175 mcg by mouth daily at 2 am. 0300  . oxyCODONE 10 MG TABS Take 1-3 tablets (10-30 mg total) by mouth every 4 (four) hours.  . polyethylene glycol (MIRALAX / GLYCOLAX) packet Take 17 g by mouth 2 (two) times daily. (Patient taking differently: Take 17 g by mouth 2 (two) times daily as needed (for constipation). )  . promethazine (PHENERGAN) 25 MG tablet  Take 25 mg by mouth every 6 (six) hours as needed for nausea or vomiting.  Marland Kitchen RAPAFLO 8 MG CAPS capsule Take 8 mg by mouth daily with breakfast.   . senna (SENOKOT) 8.6 MG TABS tablet Take 1 tablet by mouth 2 (two) times daily as needed for mild constipation.  Marland Kitchen tenofovir (VIREAD) 300 MG tablet Take 1 tablet (300 mg total) by mouth daily.  Marland Kitchen testosterone cypionate (DEPOTESTOSTERONE CYPIONATE) 200 MG/ML injection Inject 100 mg into the muscle every 14 (fourteen) days.   . valACYclovir (VALTREX) 1000 MG tablet Take 1 tablet (1,000 mg total) by mouth daily.  Marland Kitchen venlafaxine XR (EFFEXOR XR) 37.5 MG 24 hr capsule Take 1 capsule (37.5 mg total) by mouth daily.  Marland Kitchen zolpidem (AMBIEN) 10 MG tablet Take 1/2 to 1 tablet by mouth as needed at bedtime (Patient taking differently: Take 10 mg by mouth at bedtime. )  . albuterol (PROVENTIL HFA;VENTOLIN HFA) 108 (90 Base) MCG/ACT inhaler Inhale 2-4 puffs into the lungs daily as needed for shortness of breath.   . ferrous sulfate 325 (65 FE) MG tablet Take 1 tablet (325 mg total) by mouth 3 (three) times daily after meals. (Patient not taking: Reported on 06/24/2016)  . loperamide (IMODIUM) 2 MG capsule Take 2-4 mg by mouth 3 (three) times daily as needed for diarrhea or loose stools.  . methocarbamol (ROBAXIN) 500 MG tablet Take 1 tablet (500  mg total) by mouth every 6 (six) hours as needed for muscle spasms. (Patient not taking: Reported on 06/24/2016)  . ondansetron (ZOFRAN) 4 MG tablet TAKE 1 TABLET BY MOUTH EVERY 8 HOURS AS NEEDED FOR NAUSEA OR VOMITING. (Patient not taking: Reported on 06/24/2016)   No facility-administered encounter medications on file as of 06/24/2016.      Patient Active Problem List   Diagnosis Date Noted  . Status post right knee replacement 06/10/2016  . S/P right TKA 06/09/2016  . Acquired absence of knee joint following explantation of joint prosthesis with presence of antibiotic-impregnated cement spacer 04/06/2016  . S/P total knee  arthroplasty 04/06/2016  . Infection of prosthetic right knee joint ([place]) 04/06/2016  . Chronic renal insufficiency, stage 3 (moderate) 04/06/2016  . Rheumatoid arthritis ([place]) 04/06/2016  . Unintentional weight loss 04/06/2016  . Obstructive sleep apnea 02/11/2016  . Hx of mixed drug abuse 01/29/2016  . Seizure ([place])   . IVDU (intravenous drug user)   . Staphylococcal arthritis of right knee ([place])   . 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 Chronic hepatitis C without hepatic coma ([place]) 07/18/2015  . Normocytic anemia 07/18/2015  . Opacity of lung on imaging study, bilateral 07/18/2015  . Anxiety state 07/18/2015  . Chronic pain syndrome 05/21/2014  . Essential hypertension 08/28/2013  . GERD (gastroesophageal reflux disease) 08/28/2013  . Osteoarthritis of left knee 08/23/2013  . Alcohol dependence ([place]) 07/14/2013  . Major depression 07/07/2013  . Hypothyroidism 04/13/2011     Health Maintenance Due  Topic Date Due  . COLONOSCOPY  06/21/2002     Review of Systems  Physical Exam   BP 105/68   Pulse (!) 105   Temp 97.9 F (36.6 C) (Oral)   Ht 5\' 11"  (1.803 m)   Wt 184 lb (83.5 kg)   BMI 25.66 kg/m   Physical Exam  Constitutional: He is oriented to person, place, and time. He appears well-developed and well-nourished. No distress.  HENT:  Mouth/Throat: Oropharynx is clear and moist. No oropharyngeal exudate.  Cardiovascular: Normal rate, regular rhythm and normal heart sounds. Exam reveals no gallop and no friction rub.  No murmur heard.  Pulmonary/Chest: Effort normal and breath sounds normal. No respiratory distress. He has no wheezes.  Ext: right knee swelling, warmth, scattered echymosis to distal thigh Neurological: He is alert and oriented to person, place, and time.  Skin: Skin is warm and dry. No rash noted. No erythema.  Psychiatric: He has a normal mood and affect. His behavior is normal.    Lab Results  Component Value  Date   HEPBSAB NEG 09/03/2015   Lab Results  Component Value Date   LABRPR NON REACTIVE 04/14/2011    CBC Lab Results  Component Value Date   WBC 20.6 (H) 06/11/2016   RBC 3.48 (L) 06/11/2016   HGB 11.1 (L) 06/11/2016   HCT 33.2 (L) 06/11/2016   PLT 289 06/11/2016   MCV 95.4 06/11/2016   MCH 31.9 06/11/2016   MCHC 33.4 06/11/2016   RDW 16.6 (H) 06/11/2016   LYMPHSABS 2,945 05/27/2016   MONOABS 760 05/27/2016   EOSABS 570 (H) 05/27/2016    BMET Lab Results  Component Value Date   NA 136 06/11/2016   K 5.0 06/11/2016   CL 107 06/11/2016   CO2 24 06/11/2016   GLUCOSE 138 (H) 06/11/2016   BUN 24 (H) 06/11/2016   CREATININE 2.04 (H) 06/11/2016   CALCIUM 8.8 (L) 06/11/2016  GFRNONAA 33 (L) 06/11/2016   GFRAA 38 (L) 06/11/2016      Assessment and Plan Chronic PJI s/p new implant : he was placed on 2 months post tka fo suppression with doxy. Continue for now  Recent fall: he has good range of motion, but has upcoming appt with dr Alvan Dame to assess further need for aspiration and imaging  ckd 3- will change his hepatitis b regimen to  taf/vimlidy  Chronic hep b=  Will change his medication due to his renal clearance  Herpes simplex = will ask him to use valtrex as needed, not daily

## 2016-07-09 ENCOUNTER — Ambulatory Visit (HOSPITAL_COMMUNITY): Payer: Self-pay | Admitting: Psychiatry

## 2016-07-15 ENCOUNTER — Telehealth: Payer: Self-pay | Admitting: *Deleted

## 2016-07-15 NOTE — Telephone Encounter (Signed)
Patient called and advised Minh helped him with a medication issue and he would like to speak to him if possible he is still having an issue. Hulan Saas is with a patient but will have him call as soon as he can.

## 2016-07-16 ENCOUNTER — Telehealth: Payer: Self-pay | Admitting: Pharmacist Clinician (PhC)/ Clinical Pharmacy Specialist

## 2016-07-16 ENCOUNTER — Other Ambulatory Visit: Payer: Self-pay | Admitting: Pharmacist Clinician (PhC)/ Clinical Pharmacy Specialist

## 2016-07-16 MED ORDER — TENOFOVIR ALAFENAMIDE FUMARATE 25 MG PO TABS: 25.0000 mg | ORAL_TABLET | Freq: Every day | ORAL | 11 refills | Status: DC

## 2016-07-16 NOTE — Telephone Encounter (Signed)
Juan Wiggins called to say that he is having issue with copay for his TDF/TAF. Based on his plan, he has to fill it with [facility]. [facility] requires a PA also. Did it for him this PM and it has been approved. He will call [facility] to ask them to ship and give them the copay card information.

## 2016-07-28 ENCOUNTER — Ambulatory Visit: Payer: Self-pay | Admitting: Internal Medicine

## 2016-08-06 ENCOUNTER — Telehealth (HOSPITAL_COMMUNITY): Payer: Self-pay

## 2016-08-06 ENCOUNTER — Other Ambulatory Visit (HOSPITAL_COMMUNITY): Payer: Self-pay

## 2016-08-06 NOTE — Telephone Encounter (Signed)
Sure we can give him 30 days, no refills

## 2016-08-06 NOTE — Telephone Encounter (Signed)
Patient is calling for a refill on Ambien last filled by Dr. Modesta Messing on 2/16 for #30 x 2 refills. He has a follow up with you in July, but is out of Ambien now. Please review and advise, thank you

## 2016-08-07 ENCOUNTER — Other Ambulatory Visit (HOSPITAL_COMMUNITY): Payer: Self-pay

## 2016-08-07 MED ORDER — ZOLPIDEM TARTRATE 10 MG PO TABS: 10.0000 mg | ORAL_TABLET | Freq: Every day | ORAL | 0 refills | Status: DC

## 2016-08-07 NOTE — Telephone Encounter (Signed)
I called patient and let him know that I had called in the Ambien

## 2016-09-01 ENCOUNTER — Ambulatory Visit (INDEPENDENT_AMBULATORY_CARE_PROVIDER_SITE_OTHER): Payer: 59 | Admitting: Internal Medicine

## 2016-09-01 ENCOUNTER — Encounter: Payer: Self-pay | Admitting: Internal Medicine

## 2016-09-01 ENCOUNTER — Ambulatory Visit (INDEPENDENT_AMBULATORY_CARE_PROVIDER_SITE_OTHER): Payer: 59 | Admitting: Psychiatry

## 2016-09-01 VITALS — BP 129/87 | HR 105 | Temp 98.4°F | Ht 71.5 in | Wt 194.0 lb

## 2016-09-01 DIAGNOSIS — F3341 Major depressive disorder, recurrent, in partial remission: Secondary | ICD-10-CM

## 2016-09-01 DIAGNOSIS — B180 Chronic viral hepatitis B with delta-agent: Secondary | ICD-10-CM | POA: Diagnosis not present

## 2016-09-01 DIAGNOSIS — M150 Primary generalized (osteo)arthritis: Secondary | ICD-10-CM | POA: Diagnosis not present

## 2016-09-01 DIAGNOSIS — M0579 Rheumatoid arthritis with rheumatoid factor of multiple sites without organ or systems involvement: Secondary | ICD-10-CM | POA: Diagnosis not present

## 2016-09-01 DIAGNOSIS — M159 Polyosteoarthritis, unspecified: Secondary | ICD-10-CM

## 2016-09-01 MED ORDER — ZOLPIDEM TARTRATE 10 MG PO TABS: 10.0000 mg | ORAL_TABLET | Freq: Every day | ORAL | 1 refills | Status: DC

## 2016-09-01 MED ORDER — VENLAFAXINE HCL ER 150 MG PO CP24: 150.0000 mg | ORAL_CAPSULE | Freq: Every day | ORAL | 1 refills | Status: DC

## 2016-09-01 MED ORDER — PREGABALIN 75 MG PO CAPS: 75.0000 mg | ORAL_CAPSULE | Freq: Two times a day (BID) | ORAL | 1 refills | Status: AC

## 2016-09-01 MED ORDER — ALPRAZOLAM 0.25 MG PO TABS: 0.2500 mg | ORAL_TABLET | Freq: Every evening | ORAL | 1 refills | Status: DC | PRN

## 2016-09-01 NOTE — Progress Notes (Signed)
RFV: follow up for hiv disease  Patient ID: Juan Wiggins, male   DOB: Nov 16, 1950, 66 y.o.   MRN: [id number]  HPI Juan Wiggins is a 66yo M who has had originally MSSA septic arthritis to right knee, requiring repeated washout then abtx spacer. Developed cefazolin allergy thus treated with daptomycin in one course, then linezolid. He had new right TKA placed and was placed on 2 months of prophylaxis with doxycycline   Sees dr Alvan Dame tomorrow.  On oxycodone 10mg  takes 3 by noon then suffers throughout the rest of the afternoon.he has significant pain. He is being seen by psychiatrist for depression who recommends better pain management either by palliative care vs. Chronic pain management. No outpatient palliative care services   He continues to take vimlidy(TAF) for his chronic hep b.  Outpatient Encounter Prescriptions as of 09/01/2016  Medication Sig  . albuterol (PROVENTIL HFA;VENTOLIN HFA) 108 (90 Base) MCG/ACT inhaler Inhale 2-4 puffs into the lungs daily as needed for shortness of breath.   . ALPRAZolam (XANAX) 0.25 MG tablet Take 1 tablet (0.25 mg total) by mouth at bedtime as needed for sleep.  Marland Kitchen doxycycline (VIBRA-TABS) 100 MG tablet Take 1 tablet (100 mg total) by mouth every 12 (twelve) hours.  . hydrOXYzine (VISTARIL) 50 MG capsule Take 2 capsules (100 mg total) by mouth at bedtime. For sleep  . levothyroxine (SYNTHROID, LEVOTHROID) 175 MCG tablet Take 175 mcg by mouth daily at 2 am. 0300  . ondansetron (ZOFRAN) 4 MG tablet TAKE 1 TABLET BY MOUTH EVERY 8 HOURS AS NEEDED FOR NAUSEA OR VOMITING.  Marland Kitchen oxyCODONE 10 MG TABS Take 1-3 tablets (10-30 mg total) by mouth every 4 (four) hours.  . polyethylene glycol (MIRALAX / GLYCOLAX) packet Take 17 g by mouth 2 (two) times daily. (Patient taking differently: Take 17 g by mouth 2 (two) times daily as needed (for constipation). )  . promethazine (PHENERGAN) 25 MG tablet Take 25 mg by mouth every 6 (six) hours as needed for nausea or vomiting.  Marland Kitchen  RAPAFLO 8 MG CAPS capsule Take 8 mg by mouth daily with breakfast.   . senna (SENOKOT) 8.6 MG TABS tablet Take 1 tablet by mouth 2 (two) times daily as needed for mild constipation.  . Tenofovir Alafenamide Fumarate (VEMLIDY) 25 MG TABS Take 1 tablet (25 mg total) by mouth daily.  Marland Kitchen testosterone cypionate (DEPOTESTOSTERONE CYPIONATE) 200 MG/ML injection Inject 100 mg into the muscle every 14 (fourteen) days.   . valACYclovir (VALTREX) 1000 MG tablet Take 1 tablet (1,000 mg total) by mouth daily.  Marland Kitchen venlafaxine XR (EFFEXOR-XR) 150 MG 24 hr capsule Take 1 capsule (150 mg total) by mouth daily.  Marland Kitchen zolpidem (AMBIEN) 10 MG tablet Take 1 tablet (10 mg total) by mouth at bedtime.  Marland Kitchen acetaminophen (TYLENOL) 500 MG tablet Take 2 tablets (1,000 mg total) by mouth every 8 (eight) hours. (Patient not taking: Reported on 09/01/2016)  . aspirin 325 MG tablet Take 325 mg by mouth 2 (two) times daily.  . bisacodyl (DULCOLAX) 5 MG EC tablet Take 5 mg by mouth 2 (two) times daily as needed (for constipation.).  Marland Kitchen cholecalciferol (VITAMIN D) 1000 units tablet Take 1,000 Units by mouth daily.  Marland Kitchen docusate sodium (COLACE) 100 MG capsule Take 1 capsule (100 mg total) by mouth 2 (two) times daily. (Patient not taking: Reported on 09/01/2016)  . ferrous sulfate 325 (65 FE) MG tablet Take 1 tablet (325 mg total) by mouth 3 (three) times daily after meals. (Patient not  taking: Reported on 06/24/2016)  . gabapentin (NEURONTIN) 300 MG capsule Take 300 mg by mouth every 6 (six) hours as needed (for bone/joint/muscle pain.).  Marland Kitchen loperamide (IMODIUM) 2 MG capsule Take 2-4 mg by mouth 3 (three) times daily as needed for diarrhea or loose stools.  . methocarbamol (ROBAXIN) 500 MG tablet Take 1 tablet (500 mg total) by mouth every 6 (six) hours as needed for muscle spasms. (Patient not taking: Reported on 06/24/2016)   No facility-administered encounter medications on file as of 09/01/2016.      Patient Active Problem List   Diagnosis  Date Noted  . Status post right knee replacement 06/10/2016  . S/P right TKA 06/09/2016  . Acquired absence of knee joint following explantation of joint prosthesis with presence of antibiotic-impregnated cement spacer 04/06/2016  . S/P total knee arthroplasty 04/06/2016  . Infection of prosthetic right knee joint ([place]) 04/06/2016  . Chronic renal insufficiency, stage 3 (moderate) 04/06/2016  . Rheumatoid arthritis ([place]) 04/06/2016  . Unintentional weight loss 04/06/2016  . Obstructive sleep apnea 02/11/2016  . Hx of mixed drug abuse 01/29/2016  . Seizure ([place])   . IVDU (intravenous drug user)   . Staphylococcal arthritis of right knee ([place])   . 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 Chronic hepatitis C without hepatic coma ([place]) 07/18/2015  . Normocytic anemia 07/18/2015  . Opacity of lung on imaging study, bilateral 07/18/2015  . Anxiety state 07/18/2015  . Chronic pain syndrome 05/21/2014  . Essential hypertension 08/28/2013  . GERD (gastroesophageal reflux disease) 08/28/2013  . Osteoarthritis of left knee 08/23/2013  . Alcohol dependence ([place]) 07/14/2013  . Major depression 07/07/2013  . Hypothyroidism 04/13/2011     Health Maintenance Due  Topic Date Due  . COLONOSCOPY  06/21/2002     Review of Systems + chronic pain. + depressed mood. Otherwise 12 point ros is negative Physical Exam   BP 129/87   Pulse (!) 105   Temp 98.4 F (36.9 C) (Oral)   Ht 5' 11.5" (1.816 m)   Wt 194 lb (88 kg)   BMI 26.68 kg/m    Physical Exam  Constitutional: He is oriented to person, place, and time. He appears well-developed and well-nourished. No distress.  HENT:  Mouth/Throat: Oropharynx is clear and moist. No oropharyngeal exudate.  Cardiovascular: Normal rate, regular rhythm and normal heart sounds. Exam reveals no gallop and no friction rub.  No murmur heard.  Pulmonary/Chest: Effort normal and breath sounds normal. No respiratory distress. He has  no wheezes.  Abdominal: Soft. Bowel sounds are normal. He exhibits no distension. There is no tenderness.  Lymphadenopathy:  He has no cervical adenopathy.  Neurological: He is alert and oriented to person, place, and time.  Skin: Skin is warm and dry. No rash noted. No erythema.  Psychiatric: He has a normal mood and affect. His behavior is normal.    Lab Results  Component Value Date   HEPBSAB NEG 09/03/2015   Lab Results  Component Value Date   LABRPR NON REACTIVE 04/14/2011    CBC Lab Results  Component Value Date   WBC 20.6 (H) 06/11/2016   RBC 3.48 (L) 06/11/2016   HGB 11.1 (L) 06/11/2016   HCT 33.2 (L) 06/11/2016   PLT 289 06/11/2016   MCV 95.4 06/11/2016   MCH 31.9 06/11/2016   MCHC 33.4 06/11/2016   RDW 16.6 (H) 06/11/2016   LYMPHSABS 2,945 05/27/2016   MONOABS 760 05/27/2016   EOSABS 570 (H)  05/27/2016    BMET Lab Results  Component Value Date   NA 140 06/24/2016   K 4.8 06/24/2016   CL 107 06/24/2016   CO2 25 06/24/2016   GLUCOSE 88 06/24/2016   BUN 12 06/24/2016   CREATININE 1.95 (H) 06/24/2016   CALCIUM 9.1 06/24/2016   GFRNONAA 33 (L) 06/11/2016   GFRAA 38 (L) 06/11/2016      Assessment and Plan  Chronic pain = will recommend to also go to chronic pain clinic to see which medications would be best  ckd 3= appears stable  Chronic hep b = will check VL  Hx of pji of right knee = finishing out doxycycline

## 2016-09-01 NOTE — Progress Notes (Signed)
[facility] MD/PA/NP OP Progress Note  09/01/2016 3:09 PM Juan Wiggins  MRN:  [id number]  Chief Complaint: med check follow-up  Subjective:  Juan Wiggins presents today for medication management follow-up. We spent a substantial amount of time discussing end-of-life, quality of life, and the risk of these types of medicines such as opiates and benzodiazepines. He was frank with Probation officer that he does not wish to be dead, and he does not want his life and, but the possibility of having relief from his pain far outweighs any potential risk that he might die from the comorbid use of opiates and benzodiazepines.  He reports that he has been trying to use the medicines that we've prescribed such as Effexor, and continues on 75 mg XR daily.  He reports that it's difficult for him to have more than 2-3 hours of activity in the day. He describes a level of pain that is utterly intolerable, on a weekly basis, and leaves him feeling like he wishes he would just die.  He denies any plans or intent to harm himself.  I spent time with the patient having a discussion of palliative care and how this might benefit him. Spent time discussing that the philosophy of palliative care is to alleviate the suffering of illnesses that have no repair or recovery, and specifically in his case of crippling and progressive rheumatoid arthritis, he has come to live with an indoor significant amount of pain.  He and I spent time discussing the risks and benefits of seeing a palliative care doctor, and he was extremely receptive to this. Reports that he would rather have a little bit more time on earth with less suffering, then a longer time on earth with the continued suffering that he has right now.  He agrees to follow-up with them for a referral, and follow-up with this writer in 2-3 months.  He agrees that our goals of care need to be discussed in terms of quality of life rather than quantity.  He was agreeable increase Effexor to 150 mg  daily, and also we discussed continuing his Ambien. He asks if he can have something to boost the effects of the Ambien, and specifically asks for Xanax. Given that Xanax can have the least respiratory depression, and tends to be quite short acting, I agreed to augment his Ambien with 0.25 mg Xanax. Discussed the habit-forming nature of these medicines. He reports that he has remained abstinent from alcohol.  Visit Diagnosis:    ICD-10-CM   1. Rheumatoid arthritis involving multiple sites with positive rheumatoid factor (HCC) M05.79 Amb Referral to Palliative Care    ALPRAZolam (XANAX) 0.25 MG tablet  2. Recurrent major depressive disorder, in partial remission (HCC) F33.41 ALPRAZolam (XANAX) 0.25 MG tablet    venlafaxine XR (EFFEXOR-XR) 150 MG 24 hr capsule    Past Psychiatric History: See intake H&P for full details. Reviewed, with no updates at this time.  Past Medical History:  Past Medical History:  Diagnosis Date  . Acute renal failure (ARF) ([place]) 2017   TOOK HEMODIALYSIS X 5 WEEKS   . Anxiety   . Arthritis    RHEUMATOID ARTHRITIS; OA LEFT KNEE  . Cancer ([place])    MELANOMA REMOVED  LT SHOULDER  . Depression   . Family history of adverse reaction to anesthesia    MOTHER AND SISTER SLOW TO WAKE UP  . GERD (gastroesophageal reflux disease)    PREVACID IF NEEDED  . Heart murmur   . Hemorrhoids   .  Hepatitis A    PT STATES TYPE OF HEPATITIS YOU GET FROM SHELLFISH  . Hepatitis B   . Hepatitis C   . Hypertension    OFF ALL MEDS LAST 2 MONTHS   . Hypothyroidism   . Inguinal hernia    RIGHT  . 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 Pancreatitis, alcoholic, acute 62/6948  . Pneumonia 01/02/2016  . Seizures ([place]) 2017   ONCE WHILE IN HOSPITAL NONE SINCE  . Septic arthritis ([place]) 01/14/2016   RT KNEE  . Sleep apnea    CLAUSTROPHOBIC - COULD NOT TOLERATE CPAP MASK  . Tachycardia    PT STATES HIS HEART RATE USUALLY 100 OR MORE    Past  Surgical History:  Procedure Laterality Date  . colonscopy     . ELBOW SURGERY     BILATERAL ELBOW SURGERY  . EXCISIONAL TOTAL KNEE ARTHROPLASTY WITH ANTIBIOTIC SPACERS Right 01/21/2016   Procedure: REMOVAL OF KNEE PROSTESIS WITH RIGHT KNEE PLACEMENT OF ANTIBIOTIC SPACER;  Surgeon: Latanya Maudlin, MD;  Location: [facility];  Service: Orthopedics;  Laterality: Right;  . EXCISIONAL TOTAL KNEE ARTHROPLASTY WITH ANTIBIOTIC SPACERS Right 04/06/2016   Procedure: Right knee repeat irrigation and debridement, antibiotic spacer vers right knee reimplantation right total knee;  Surgeon: Paralee Cancel, MD;  Location: [facility];  Service: Orthopedics;  Laterality: Right;  Requests 2 hours  . EYE SURGERY     [facility] [place]  . I&D EXTREMITY Right 01/02/2016   Procedure: IRRIGATION AND DEBRIDEMENT RIGHT TOTAL KNEE;  Surgeon: Latanya Maudlin, MD;  Location: [facility];  Service: Orthopedics;  Laterality: Right;  . I&D KNEE WITH POLY EXCHANGE Right 07/16/2015   Procedure: IRRIGATION AND DEBRIDEMENT RIGHT KNEE WITH POLY EXCHANGE AND PLACEMENT OF ANTIBIOTIC BEADS;  Surgeon: Latanya Maudlin, MD;  Location: [facility];  Service: Orthopedics;  Laterality: Right;  . KNEE ARTHROSCOPY Right 07/14/2015   Procedure: ARTHROSCOPY IRRIGATION AND DEBRIDEMENT - KNEE;  Surgeon: Justice Britain, MD;  Location: [facility];  Service: Orthopedics;  Laterality: Right;  . KNEE ARTHROSCOPY Right 12/29/2015   Procedure: ARTHROSCOPIC IRRIGATION AND DEBRIEDMENT RIGHT  KNEE;  Surgeon: Melina Schools, MD;  Location: [facility];  Service: Orthopedics;  Laterality: Right;  . KNEE ARTHROSCOPY Right 01/15/2016   Procedure: ARTHROSCOPIC WASHOUT;  Surgeon: Latanya Maudlin, MD;  Location: [facility];  Service: Orthopedics;  Laterality: Right;  . KNEE SURGERY     BILATERAL KNEE ARTHROSCOPY  . left TKR     July 2015  . LUMBAR LAMINECTOMY/DECOMPRESSION MICRODISCECTOMY Left 06/06/2014   Procedure: LEFT L4-5 DECOMPRESSION ;  Surgeon: Melina Schools, MD;  Location: [facility];  Service:  Orthopedics;  Laterality: Left;  . REFRACTIVE SURGERY Bilateral   . REIMPLANTATION OF TOTAL KNEE Right 06/09/2016   Procedure: Removal of antibiotic spacer and reimplantation of total knee arthroplasty;  Surgeon: Paralee Cancel, MD;  [facility];  Service: Orthopedics;  Laterality: Right;  . SHOULDER SURGERY     RIGHT ROTATOR CUFF REPAIR AND LEFT ARTHROSCOPY  . TOTAL KNEE ARTHROPLASTY Left 08/23/2013   Procedure: LEFT TOTAL KNEE ARTHROPLASTY;  Surgeon: Tobi Bastos, MD;  [facility];  Service: Orthopedics;  Laterality: Left;  . TOTAL KNEE ARTHROPLASTY Right 02/07/2014   Procedure: RIGHT TOTAL KNEE ARTHROPLASTY;  Surgeon: Tobi Bastos, MD;  [facility];  Service: Orthopedics;  Laterality: Right;    Family Psychiatric History: See intake H&P for full details. Reviewed, with no updates at this time.  Family History:  Family History  Problem Relation Age of Onset  . Hypertension Mother   . Other Mother        varicose veins  . Hypertension Sister     Social History:  Social History   Social History  . Marital status: Legally Separated    Spouse name: N/A  . Number of children: N/A  . Years of education: N/A   Social History Main Topics  . Smoking status: Former Smoker    Packs/day: 1.50    Years: 35.00    Types: Cigars    Quit date: 02/22/1998  . Smokeless tobacco: Never Used     Comment: used cigarettes 20 years ago  . Alcohol use No     Comment: " last alcohol use was in December 2015."  . Drug use: No     Comment: Pt states that he hasn't used since 10/2015  . Sexual activity: Yes    Birth control/ protection: None     Comment: given flavored condoms per Pt request   Other Topics Concern  . Not on file   Social History Narrative  . No narrative on file    Allergies:  Allergies  Allergen Reactions  . Cefazolin Other (See Comments)    Patient had a seizure while on cefazolin and no other core cause could be identified other than the beta  lactam  . Heparin Other (See Comments)    Low platelets (130s), no HIT testing performed, platelets recovered.  . Wellbutrin [Bupropion] Other (See Comments)    Hallucinations-started on 150mg  daily dosage    Metabolic Disorder Labs: No results found for: HGBA1C, MPG No results found for: PROLACTIN Lab Results  Component Value Date   TRIG 88 05/22/2014     Current Medications: Current Outpatient Prescriptions  Medication Sig Dispense Refill  . acetaminophen (TYLENOL) 500 MG tablet Take 2 tablets (1,000 mg total) by mouth every 8 (eight) hours. 30 tablet 0  . albuterol (PROVENTIL HFA;VENTOLIN HFA) 108 (90 Base) MCG/ACT inhaler Inhale 2-4 puffs into the lungs daily as needed for shortness of breath.     . ALPRAZolam (XANAX) 0.25 MG tablet Take 1 tablet (0.25 mg total) by mouth at bedtime as needed for sleep. 30 tablet 1  . aspirin 325 MG tablet Take 325 mg by mouth 2 (two) times daily.    . bisacodyl (DULCOLAX) 5 MG EC tablet Take 5 mg by mouth 2 (two) times daily as needed (for constipation.).    Marland Kitchen cholecalciferol (VITAMIN D) 1000 units tablet Take 1,000 Units by mouth daily.    Marland Kitchen docusate sodium (COLACE) 100 MG capsule Take 1 capsule (100 mg total) by mouth 2 (two) times daily. 10 capsule 0  . doxycycline (VIBRA-TABS) 100 MG tablet Take 1 tablet (100 mg total) by mouth every 12 (twelve) hours. 60 tablet 2  . ferrous sulfate 325 (65 FE) MG tablet Take 1 tablet (325 mg total) by mouth 3 (three) times daily after meals. (Patient not taking: Reported on 06/24/2016)  3  . gabapentin (NEURONTIN) 300 MG capsule Take 300 mg by mouth every 6 (six) hours as needed (for bone/joint/muscle pain.).    Marland Kitchen hydrOXYzine (VISTARIL) 50 MG capsule Take 2 capsules (100 mg total) by mouth at bedtime. For sleep 180 capsule 1  . levothyroxine (SYNTHROID, LEVOTHROID) 175 MCG tablet Take 175 mcg by mouth daily at 2 am. 0300    . loperamide (IMODIUM) 2 MG capsule Take 2-4 mg by mouth 3 (three) times daily as  needed  for diarrhea or loose stools.    . methocarbamol (ROBAXIN) 500 MG tablet Take 1 tablet (500 mg total) by mouth every 6 (six) hours as needed for muscle spasms. (Patient not taking: Reported on 06/24/2016) 40 tablet 0  . ondansetron (ZOFRAN) 4 MG tablet TAKE 1 TABLET BY MOUTH EVERY 8 HOURS AS NEEDED FOR NAUSEA OR VOMITING. (Patient not taking: Reported on 06/24/2016) 20 tablet 1  . oxyCODONE 10 MG TABS Take 1-3 tablets (10-30 mg total) by mouth every 4 (four) hours. 30 tablet 0  . polyethylene glycol (MIRALAX / GLYCOLAX) packet Take 17 g by mouth 2 (two) times daily. (Patient taking differently: Take 17 g by mouth 2 (two) times daily as needed (for constipation). ) 14 each 0  . promethazine (PHENERGAN) 25 MG tablet Take 25 mg by mouth every 6 (six) hours as needed for nausea or vomiting.    Marland Kitchen RAPAFLO 8 MG CAPS capsule Take 8 mg by mouth daily with breakfast.     . senna (SENOKOT) 8.6 MG TABS tablet Take 1 tablet by mouth 2 (two) times daily as needed for mild constipation.    . Tenofovir Alafenamide Fumarate (VEMLIDY) 25 MG TABS Take 1 tablet (25 mg total) by mouth daily. 30 tablet 11  . testosterone cypionate (DEPOTESTOSTERONE CYPIONATE) 200 MG/ML injection Inject 100 mg into the muscle every 14 (fourteen) days.     . valACYclovir (VALTREX) 1000 MG tablet Take 1 tablet (1,000 mg total) by mouth daily. 30 tablet 11  . venlafaxine XR (EFFEXOR-XR) 150 MG 24 hr capsule Take 1 capsule (150 mg total) by mouth daily. 90 capsule 1  . zolpidem (AMBIEN) 10 MG tablet Take 1 tablet (10 mg total) by mouth at bedtime. 30 tablet 1   No current facility-administered medications for this visit.     Neurologic: Headache: Negative Seizure: Negative Paresthesias: Negative  Musculoskeletal: Strength & Muscle Tone: abnormal, decreased and atrophy Gait & Station: unsteady, shuffle Patient leans: Front  Psychiatric Specialty Exam: ROS  There were no vitals taken for this visit.There is no height or weight on file  to calculate BMI.  General Appearance: Casual, Fairly Groomed and severe arthrtisi, joint bruising  Eye Contact:  Good  Speech:  Clear and Coherent  Volume:  Normal  Mood:  in pain  Affect:  Appropriate and Congruent  Thought Process:  Goal Directed  Orientation:  Full (Time, Place, and Person)  Thought Content: Logical   Suicidal Thoughts:  No  Homicidal Thoughts:  No  Memory:  Immediate;   Good  Judgement:  Good  Insight:  Fair  Psychomotor Activity:  Normal  Concentration:  Concentration: Fair  Recall:  Negative  Fund of Knowledge: Good  Language: Good  Akathisia:  Negative  Handed:  Right  AIMS (if indicated):  0  Assets:  Communication Skills Desire for Improvement Financial Resources/Insurance Housing Transportation  ADL's:  Intact  Cognition: WNL  Sleep:  2-3 hours    Treatment Plan Summary: Juan Wiggins is a 66 year old male with severe rheumatoid arthritis, and multiple medical problems, in addition to history of depression and alcohol use disorder.  His pain is quite debilitating to him, and significantly affecting his quality of life.  He is agreeable to palliative care referral and to follow-up with this writer in 2-3 months.  We spent time today discussing his goals of care, and I dissipate he may want to have a further discussion with his palliative care doctors about his resuscitation status. I agreed to  augment his Ambien with a low dose of Xanax to see if it may further benefit his mood and anxiety at night so that he can get better sleep.  1. Rheumatoid arthritis involving multiple sites with positive rheumatoid factor ([place])   2. Recurrent major depressive disorder, in partial remission (HCC)    Ambien 10 mg daily at bedtime and Xanax 0.25 mg daily at bedtime Increase Effexor to 150 mg XR daily Referral to palliative care Return to clinic in 3 months  Aundra Dubin, MD 09/01/2016, 3:09 PM

## 2016-09-02 LAB — RHEUMATOID FACTOR: Rhuematoid fact SerPl-aCnc: <14 IU/mL (ref <14)

## 2016-09-03 LAB — HEPATITIS B DNA, ULTRAQUANTITATIVE, PCR: Hepatitis B DNA (Calc): <1 Log IU/mL

## 2016-09-04 NOTE — Addendum Note (Signed)
Addendum  created 09/04/16 1504 by Lyndle Herrlich, MD   Sign clinical note

## 2016-09-04 NOTE — Anesthesia Postprocedure Evaluation (Signed)
Anesthesia Post Note  Patient: Juan Wiggins  Procedure(s) Performed: Procedure(s) (LRB): Right knee repeat irrigation and debridement, antibiotic spacer vers right knee reimplantation right total knee (Right)     Anesthesia Post Evaluation  Last Vitals:  Vitals:   04/09/16 2125 04/10/16 0641  BP: (!) 144/68 (!) 143/70  Pulse: 76 68  Resp: 16 16  Temp: 37.1 C 36.7 C    Last Pain:  Vitals:   04/10/16 1002  TempSrc:   PainSc: 5                  Jammy Plotkin EDWARD

## 2016-09-07 ENCOUNTER — Telehealth: Payer: Self-pay | Admitting: *Deleted

## 2016-09-07 NOTE — Telephone Encounter (Signed)
PA FOR LYRICA REJECTED BY [employer].  RECOMMENDED ALTERNATIVES GABAPENTIN OR DULOXETINE.  WILL SEND TO TO DR Baxter Flattery.

## 2016-09-09 ENCOUNTER — Other Ambulatory Visit (HOSPITAL_COMMUNITY): Payer: Self-pay | Admitting: Psychiatry

## 2016-09-09 ENCOUNTER — Other Ambulatory Visit: Payer: Self-pay | Admitting: Internal Medicine

## 2016-11-09 ENCOUNTER — Other Ambulatory Visit (HOSPITAL_COMMUNITY): Payer: Self-pay | Admitting: Psychiatry

## 2016-11-09 DIAGNOSIS — M0579 Rheumatoid arthritis with rheumatoid factor of multiple sites without organ or systems involvement: Secondary | ICD-10-CM

## 2016-11-09 DIAGNOSIS — F3341 Major depressive disorder, recurrent, in partial remission: Secondary | ICD-10-CM

## 2016-12-22 IMAGING — CR DG KNEE 3 VIEWS*R*
2 series · 2 of 2 positions shown · non-contrast
Comparison: 07/14/2015

CLINICAL DATA: Staph infection RIGHT knee, swelling, redness, pain

EXAM:
RIGHT KNEE - 3 VIEW

[knee ap]
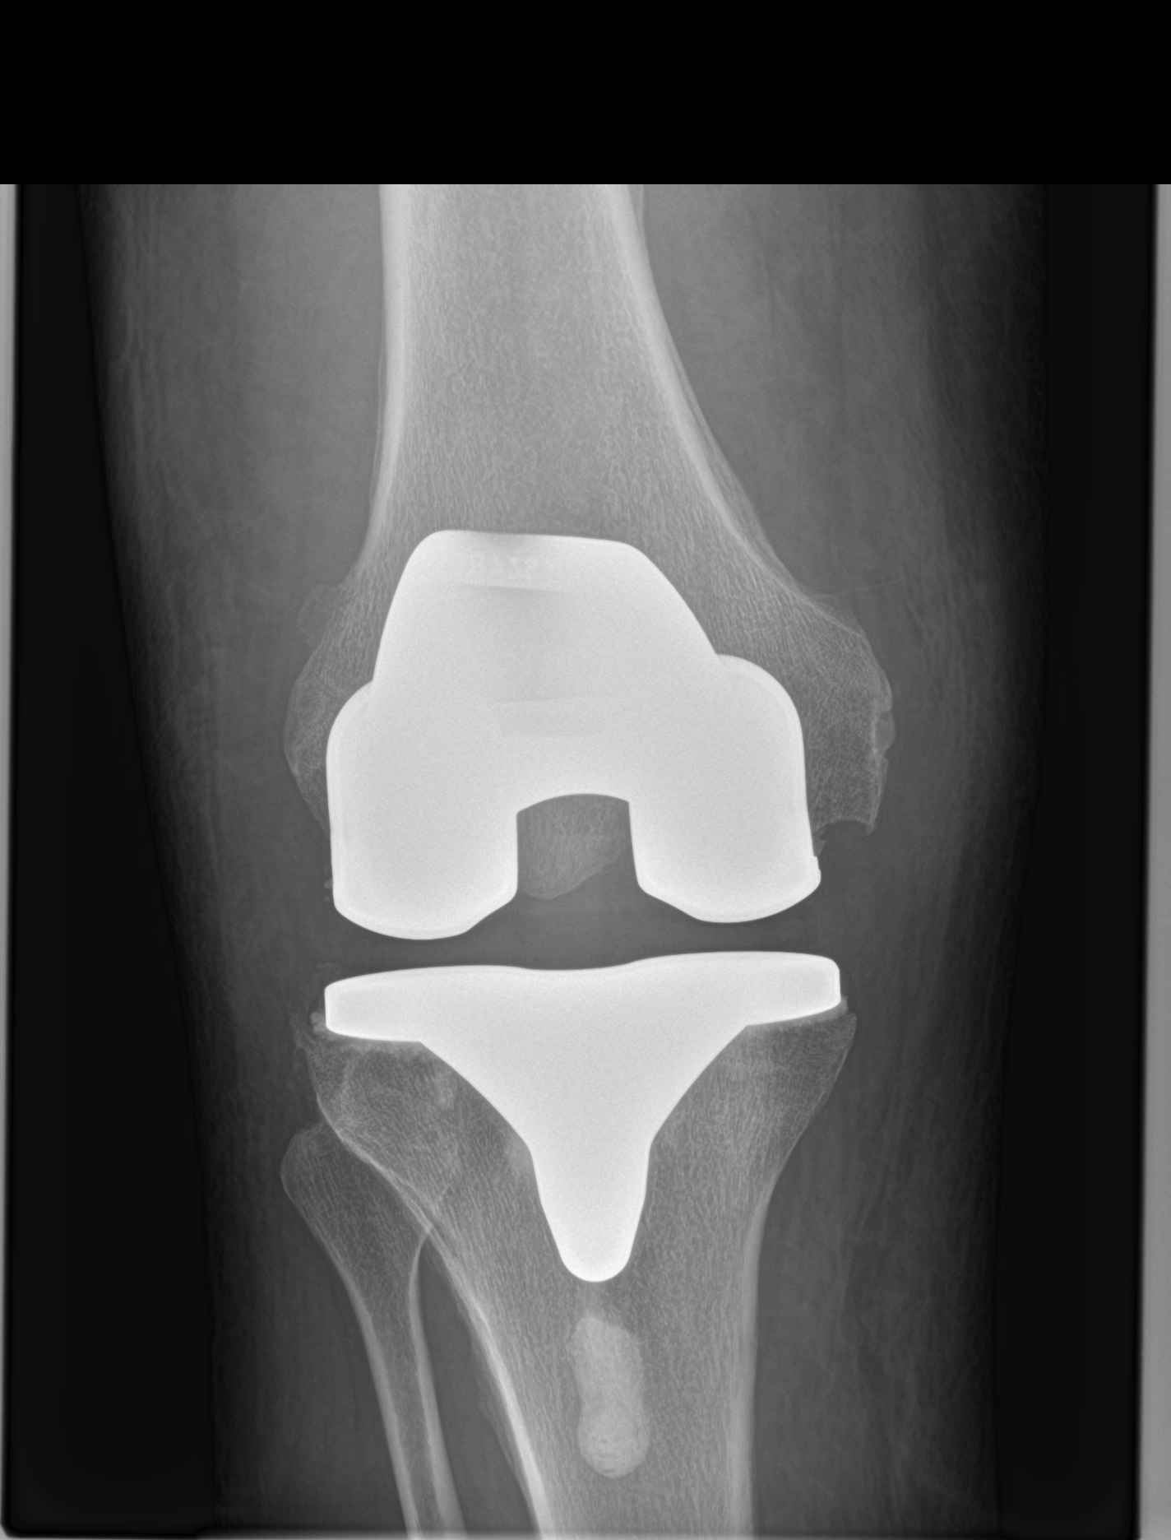

[knee lat]
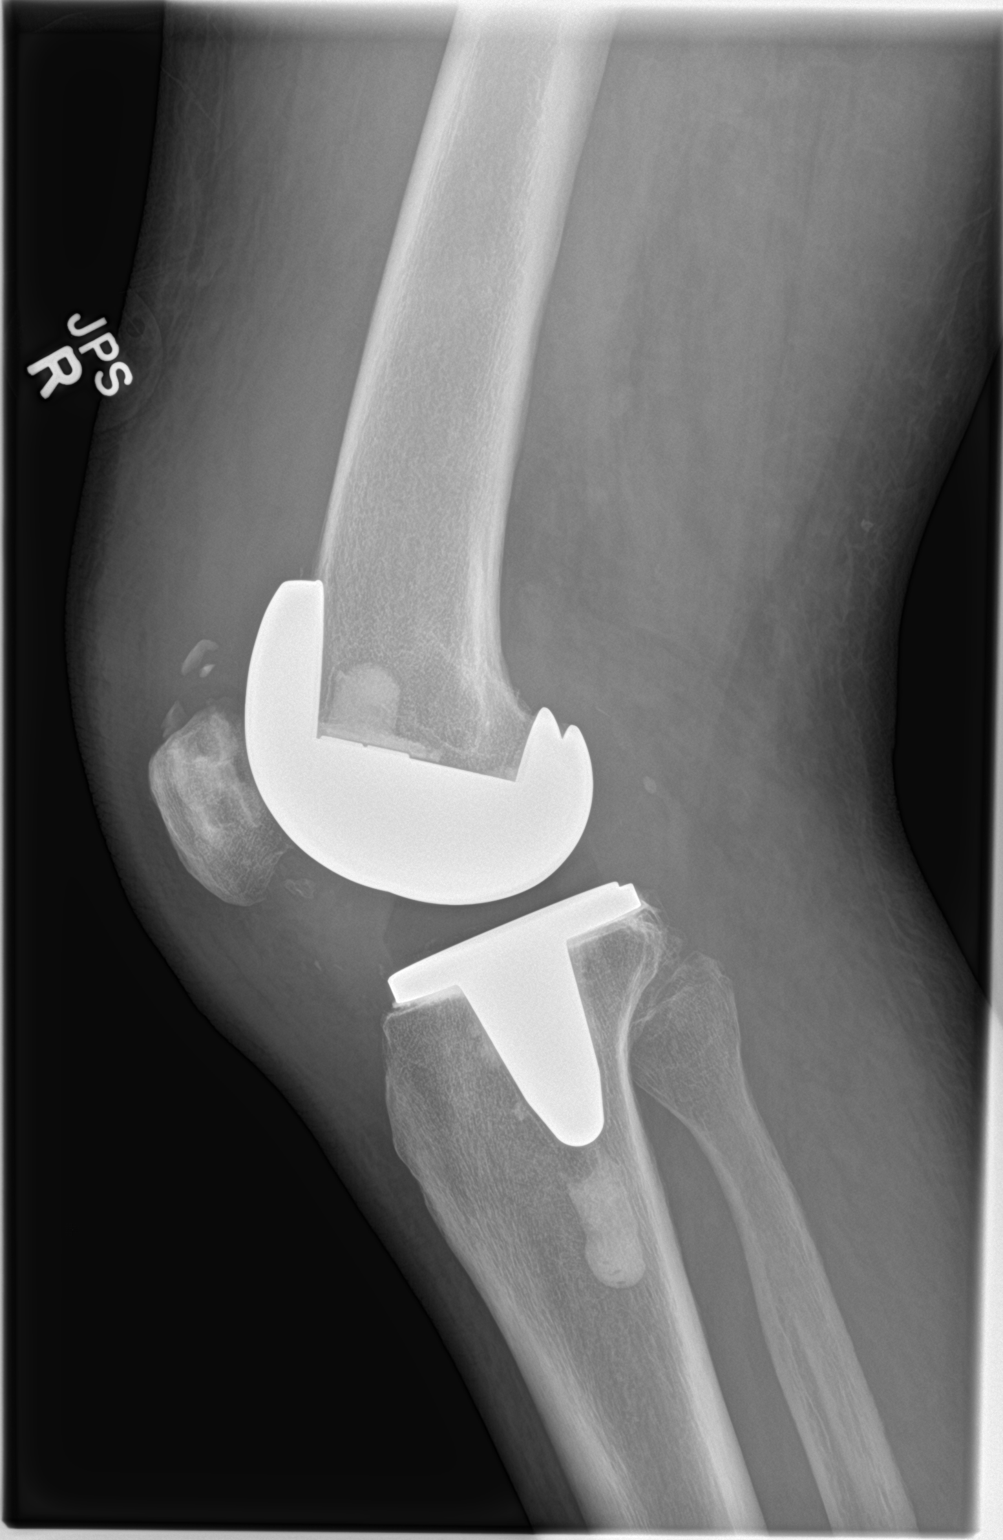

[2 of 2 positions shown; findings below may reference images not displayed]

FINDINGS: Mild osseous demineralization.

Components of RIGHT knee prosthesis again identified in expected
positions.

No acute fracture, dislocation, or bone destruction.

No periprosthetic lucency identified.

Mature well-formed periosteal new bone identified at the distal
RIGHT femur, unchanged.

Soft tissue swelling diffusely at the distal RIGHT thigh and RIGHT
knee extending into the medial proximal RIGHT calf.

No definite knee joint effusion.
IMPRESSION: RIGHT knee prosthesis without acute complication.

Soft tissue swelling RIGHT knee.

## 2016-12-27 IMAGING — MR MR HEAD W/O CM
9 of 12 series · 35 of 48 positions shown · non-contrast
Comparison: 10/20/2013 CT head.

CLINICAL DATA: 65 y/o  M; seizure.

EXAM:
MRI HEAD WITHOUT CONTRAST
TECHNIQUE: Multiplanar, multiecho pulse sequences of the brain and surrounding
structures were obtained without intravenous contrast.

[Series 3: DWI · axial · 3.0mm · 0.94mm/px · z∈[-89,+57]mm · 9 of 100 slices shown (1 of 2)]
[im 1/100]
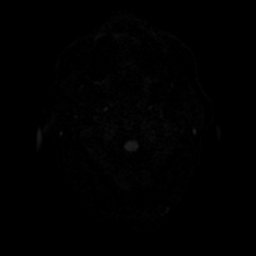
[im 13/100]
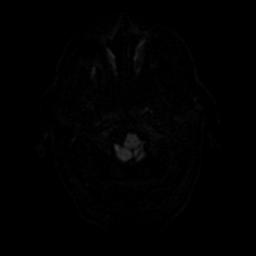
[im 25/100]
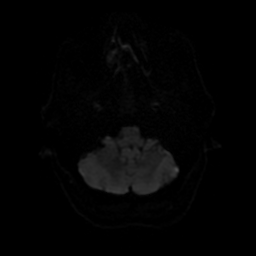
[im 38/100]
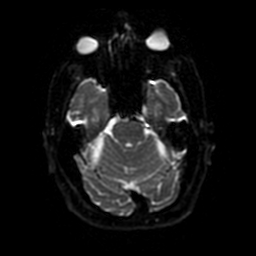
[im 50/100]
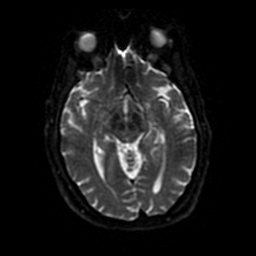
[im 62/100]
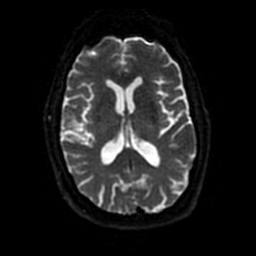
[im 75/100]
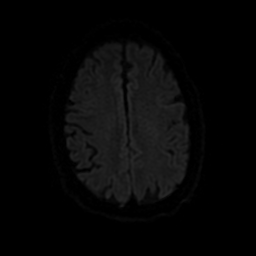
[im 87/100]
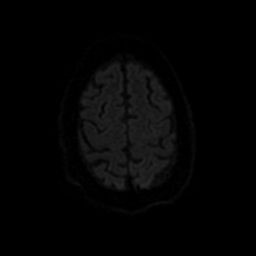
[im 100/100]
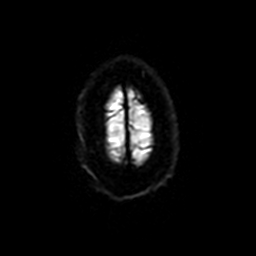

[Series 4: FLAIR · sagittal · 5.0mm · 0.47mm/px · 1 of 23 slices shown (1 of 3)]
[im 1/23]
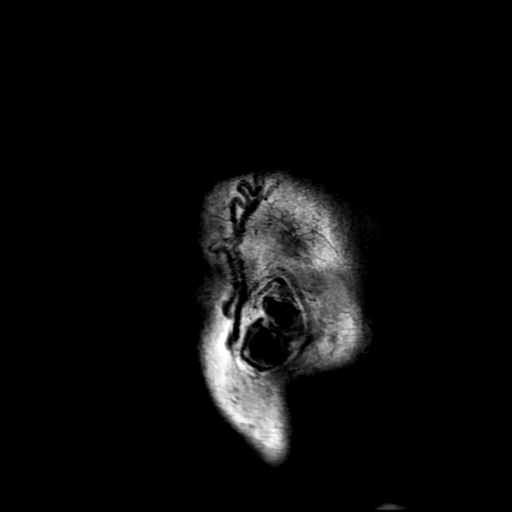

[Series 6: T2 · axial · 5.0mm · 0.47mm/px · z∈[-89,+60]mm · 3 of 26 slices shown (1 of 2)]
[im 1/26]
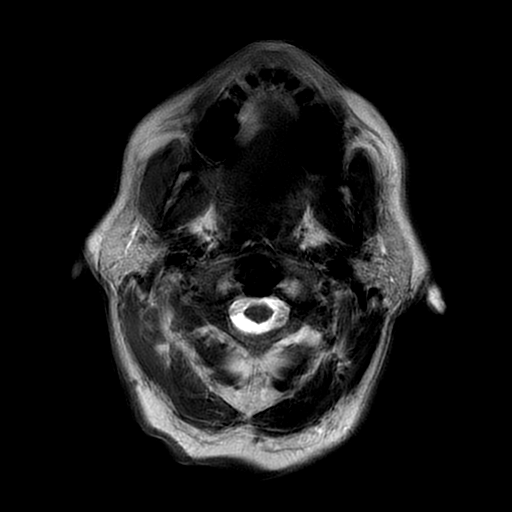
[im 13/26]
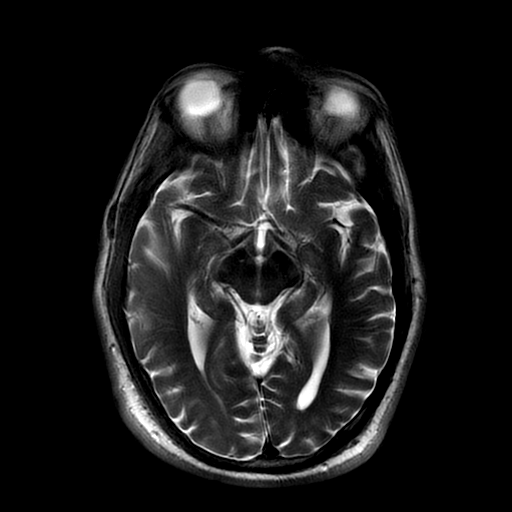
[im 26/26]
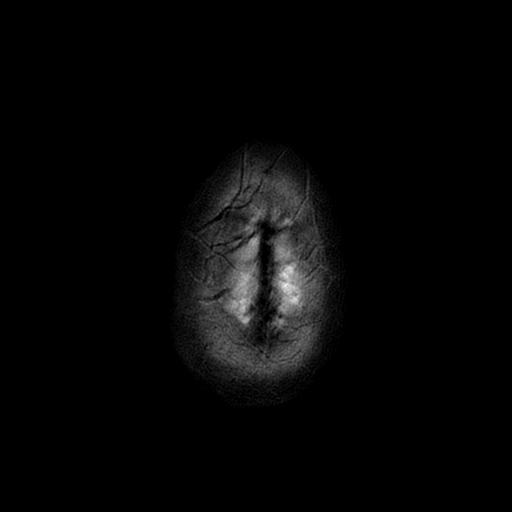

[Series 7: FLAIR · axial · 5.0mm · 0.47mm/px · z∈[-89,+60]mm · 3 of 26 slices shown (2 of 3)]
[im 1/26]
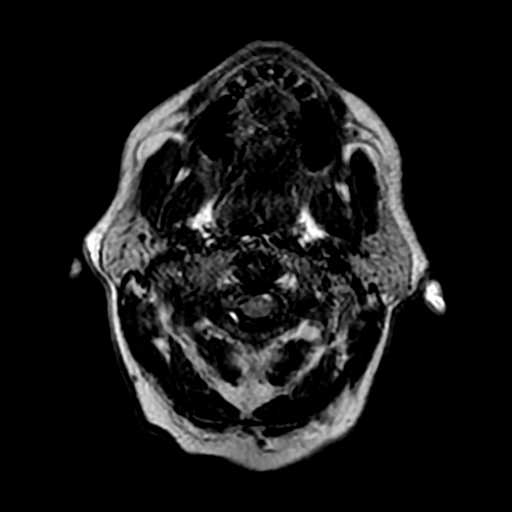
[im 13/26]
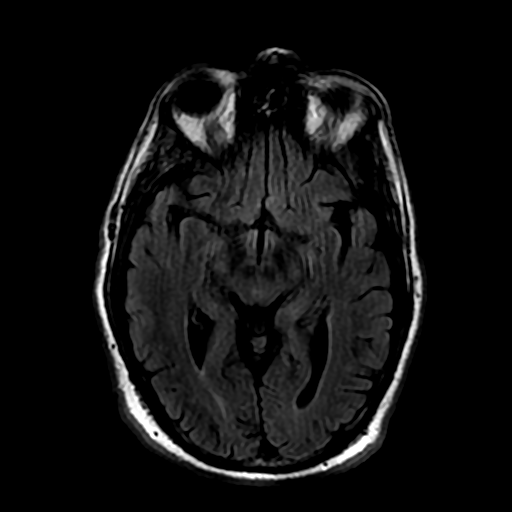
[im 26/26]
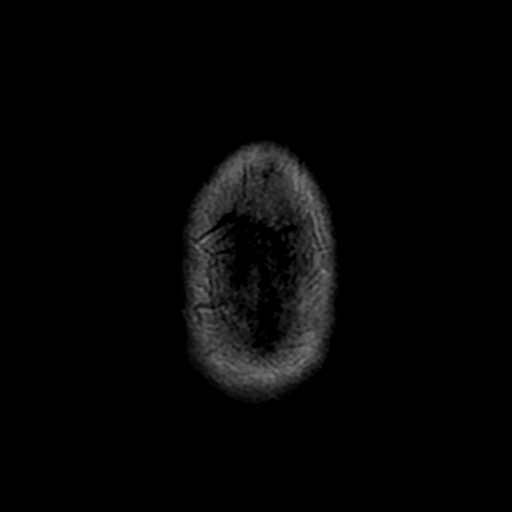

[Series 9: T2 · coronal · 5.0mm · 0.47mm/px · 1 of 31 slices shown (2 of 2)]
[im 1/31]
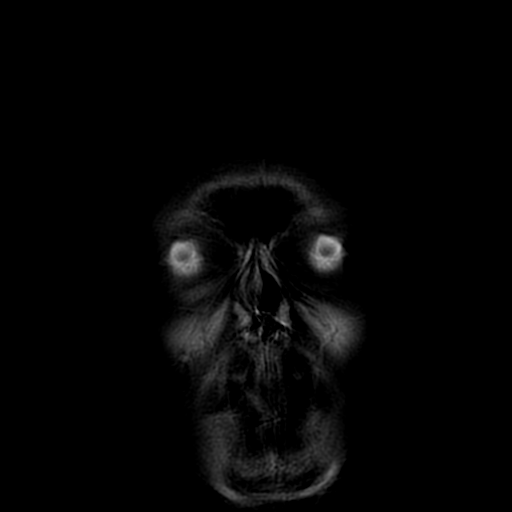

[Series 10: FLAIR · axial · 5.0mm · 0.47mm/px · z∈[-89,+60]mm · 3 of 26 slices shown (3 of 3)]
[im 1/26]
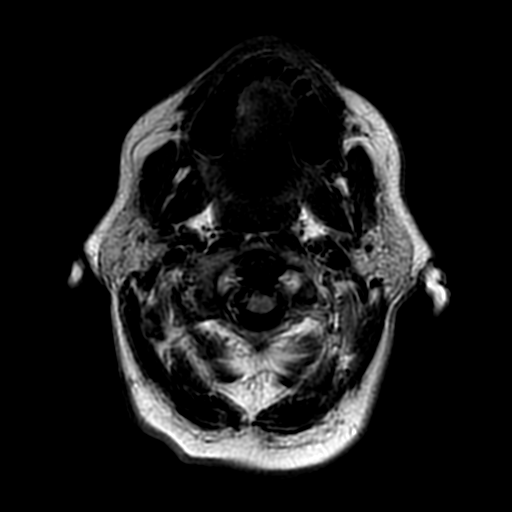
[im 13/26]
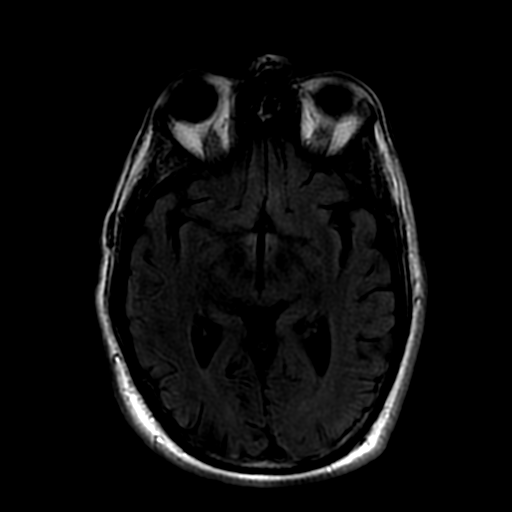
[im 26/26]
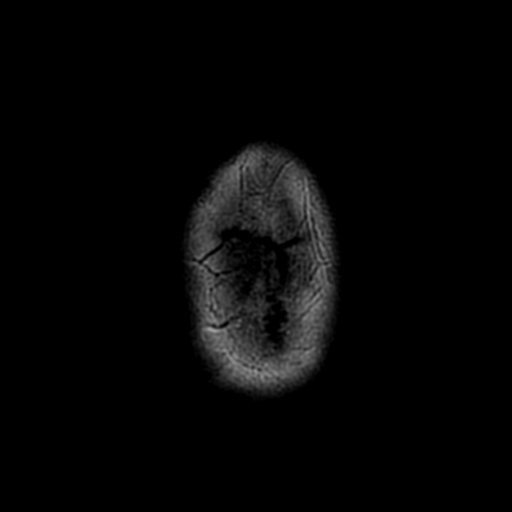

[Series 13: DWI · coronal · 4.0mm · 0.94mm/px · 7 of 72 slices shown (2 of 2)]
[im 1/72]
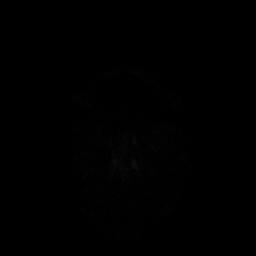
[im 12/72]
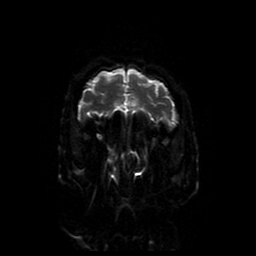
[im 24/72]
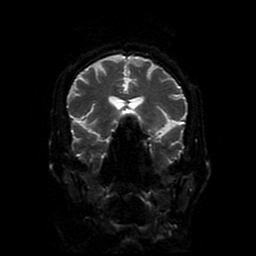
[im 36/72]
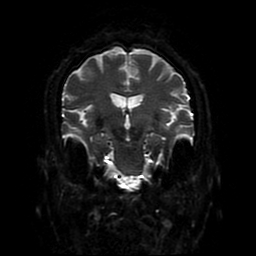
[im 48/72]
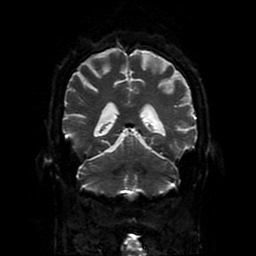
[im 60/72]
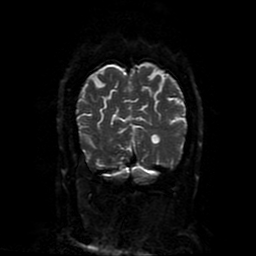
[im 72/72]
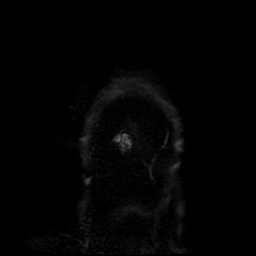

[Series 350: ADC · axial · 3.0mm · 0.94mm/px · z∈[-89,+57]mm · 4 of 45 slices shown (1 of 2)]
[im 1/45]
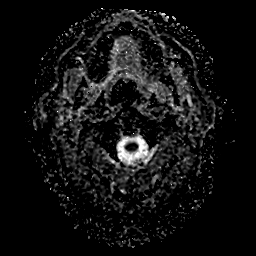
[im 15/45]
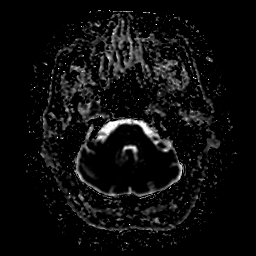
[im 30/45]
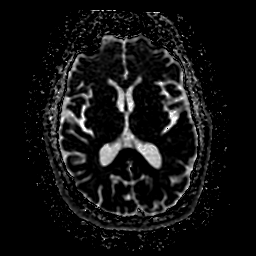
[im 45/45]
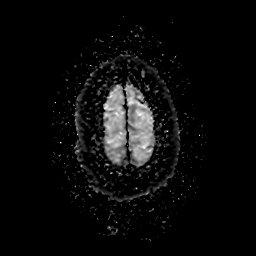

[Series 1350: ADC · coronal · 4.0mm · 0.94mm/px · 4 of 36 slices shown (2 of 2)]
[im 1/36]
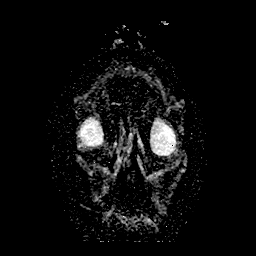
[im 12/36]
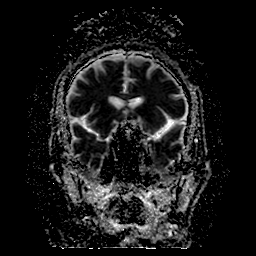
[im 24/36]
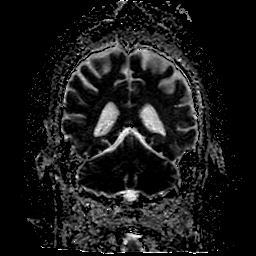
[im 36/36]
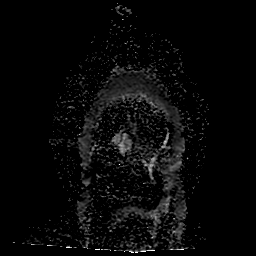

[35 of 48 positions shown; findings below may reference images not displayed]

FINDINGS: Mild motion degradation of all sequences.

Brain: Cerebellar tonsils extend 4.7 mm below the foramen magnum.
The tonsils are not pointed and there is no significant crowding of
the posterior fossa.

No diffusion signal abnormality. No significant T2 FLAIR signal
abnormality. Mild diffuse brain parenchymal volume loss. Midline
structures are intact including a complete vermis, corpus callosum,
and morphologically normal pituitary gland. No gross disorder
cortical formation or heterotopia. No abnormal susceptibility
hypointensity to indicate intracranial hemorrhage. Hippocampi by are
symmetric in size and signal bilaterally.

Vascular: Normal flow voids.

Skull and upper cervical spine: Normal marrow signal.

Sinuses/Orbits: Mild mucosal thickening of maxillary sinuses and
ethmoid air cells. Otherwise the visualized paranasal sinuses and
mastoid air cells are unremarkable. No abnormal signal of the
orbits.

Other: 14 mm left paramedian vertex scalp lipoma.
IMPRESSION: 1. Mild motion degradation of all sequences.
2. No structural cause of seizure identified.
3. No acute intracranial abnormality.
4. Low-lying cerebellar tonsils. Morphology is not suggestive of
Chiari malformation, likely normal variant.
5. Mild brain parenchymal volume loss.
6. Mild paranasal sinus disease.

By: Gii Dartora M.D.

## 2016-12-28 ENCOUNTER — Ambulatory Visit (HOSPITAL_COMMUNITY): Payer: Self-pay | Admitting: Psychiatry

## 2017-01-19 ENCOUNTER — Encounter (HOSPITAL_COMMUNITY): Payer: Self-pay | Admitting: Psychiatry

## 2017-01-19 ENCOUNTER — Ambulatory Visit (INDEPENDENT_AMBULATORY_CARE_PROVIDER_SITE_OTHER): Payer: 59 | Admitting: Psychiatry

## 2017-01-19 DIAGNOSIS — Z87891 Personal history of nicotine dependence: Secondary | ICD-10-CM | POA: Diagnosis not present

## 2017-01-19 DIAGNOSIS — F419 Anxiety disorder, unspecified: Secondary | ICD-10-CM | POA: Diagnosis not present

## 2017-01-19 DIAGNOSIS — F3341 Major depressive disorder, recurrent, in partial remission: Secondary | ICD-10-CM

## 2017-01-19 DIAGNOSIS — G8929 Other chronic pain: Secondary | ICD-10-CM

## 2017-01-19 DIAGNOSIS — F5101 Primary insomnia: Secondary | ICD-10-CM | POA: Diagnosis not present

## 2017-01-19 DIAGNOSIS — Z79899 Other long term (current) drug therapy: Secondary | ICD-10-CM | POA: Diagnosis not present

## 2017-01-19 MED ORDER — TEMAZEPAM 15 MG PO CAPS: 15.0000 mg | ORAL_CAPSULE | Freq: Every evening | ORAL | 3 refills | Status: DC | PRN

## 2017-01-19 MED ORDER — VENLAFAXINE HCL ER 150 MG PO CP24
300.0000 mg | ORAL_CAPSULE | Freq: Every day | ORAL | 1 refills | Status: DC
Start: 2017-01-19 — End: 2017-04-22

## 2017-01-19 NOTE — Progress Notes (Signed)
BH MD/PA/NP OP Progress Note  01/19/2017 2:25 PM Juan Wiggins  MRN:  [id number]  Chief Complaint: Doing okay, still grieving HPI: Patient spent much of the appointment today grieving about his ongoing sense of loss and depression and lack of understanding and insight into why his wife and children are so estranged from him.  He continues to try to make sense of how quickly his life circumstances changed 7 years ago.  He reports that from a mood and pain standpoint, he is appreciative of our last visit where we discussed palliative options, and he has now been connected with a pain management doctor in the community.  They are working on injections, and discussing palliative care.  From a sleep standpoint, he reports that he ran out of Xanax and Ambien a few weeks ago, and had missed his appointment with this Probation officer.  He reports that he was not sleeping very well with the Ambien and Xanax anyways, so he was hoping for a discussion of different medication.  We discussed initiating Restoril 15-30 mg nightly and he was agreeable to this, I educated him that this is a benzodiazepine, and may interact with his pain medications, and we reviewed the risks of respiratory depression.  Given his severe pain and goals to palliate severe insomnia in the context of chronic pain and anxiety, he was agreeable to start Restoril.  We also agreed to increase Effexor to 300 mg extended release to further target mood and depressive symptoms.  He has also scheduled a visit in January with [place] Bells in this office to initiate individual therapy.  Visit Diagnosis:    ICD-10-CM   1. Primary insomnia F51.01 temazepam (RESTORIL) 15 MG capsule  2. Recurrent major depressive disorder, in partial remission (HCC) F33.41 temazepam (RESTORIL) 15 MG capsule    venlafaxine XR (EFFEXOR-XR) 150 MG 24 hr capsule    Past Psychiatric History: See intake H&P for full details. Reviewed, with no updates at this time.   Past Medical History:   Past Medical History:  Diagnosis Date  . Acute renal failure (ARF) ([place]) 2017   TOOK HEMODIALYSIS X 5 WEEKS   . Anxiety   . Arthritis    RHEUMATOID ARTHRITIS; OA LEFT KNEE  . Cancer ([place])    MELANOMA REMOVED  LT SHOULDER  . Depression   . Family history of adverse reaction to anesthesia    MOTHER AND SISTER SLOW TO WAKE UP  . GERD (gastroesophageal reflux disease)    PREVACID IF NEEDED  . Heart murmur   . Hemorrhoids   . Hepatitis A    PT STATES TYPE OF HEPATITIS YOU GET FROM SHELLFISH  . Hepatitis B   . Hepatitis C   . Hypertension    OFF ALL MEDS LAST 2 MONTHS   . Hypothyroidism   . Inguinal hernia    RIGHT  . 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 Pancreatitis, alcoholic, acute [id number]  . Pneumonia 01/02/2016  . Seizures ([place]) 2017   ONCE WHILE IN HOSPITAL NONE SINCE  . Septic arthritis ([place]) 01/14/2016   RT KNEE  . Sleep apnea    CLAUSTROPHOBIC - COULD NOT TOLERATE CPAP MASK  . Tachycardia    PT STATES HIS HEART RATE USUALLY 100 OR MORE    Past Surgical History:  Procedure Laterality Date  . colonscopy     . ELBOW SURGERY     BILATERAL ELBOW SURGERY  . EXCISIONAL TOTAL KNEE  ARTHROPLASTY WITH ANTIBIOTIC SPACERS Right 01/21/2016   Procedure: REMOVAL OF KNEE PROSTESIS WITH RIGHT KNEE PLACEMENT OF ANTIBIOTIC SPACER;  Surgeon: Latanya Maudlin, MD;  Location: [facility];  Service: Orthopedics;  Laterality: Right;  . EXCISIONAL TOTAL KNEE ARTHROPLASTY WITH ANTIBIOTIC SPACERS Right 04/06/2016   Procedure: Right knee repeat irrigation and debridement, antibiotic spacer vers right knee reimplantation right total knee;  Surgeon: Paralee Cancel, MD;  Location: [facility];  Service: Orthopedics;  Laterality: Right;  Requests 2 hours  . EYE SURGERY     Radio [place]  . I&D EXTREMITY Right 01/02/2016   Procedure: IRRIGATION AND DEBRIDEMENT RIGHT TOTAL KNEE;  Surgeon: Latanya Maudlin, MD;  Location: [facility];  Service: Orthopedics;  Laterality: Right;   . I&D KNEE WITH POLY EXCHANGE Right 07/16/2015   Procedure: IRRIGATION AND DEBRIDEMENT RIGHT KNEE WITH POLY EXCHANGE AND PLACEMENT OF ANTIBIOTIC BEADS;  Surgeon: Latanya Maudlin, MD;  Location: [facility];  Service: Orthopedics;  Laterality: Right;  . KNEE ARTHROSCOPY Right 07/14/2015   Procedure: ARTHROSCOPY IRRIGATION AND DEBRIDEMENT - KNEE;  Surgeon: Justice Britain, MD;  Location: [facility];  Service: Orthopedics;  Laterality: Right;  . KNEE ARTHROSCOPY Right 12/29/2015   Procedure: ARTHROSCOPIC IRRIGATION AND DEBRIEDMENT RIGHT  KNEE;  Surgeon: Melina Schools, MD;  Location: [facility];  Service: Orthopedics;  Laterality: Right;  . KNEE ARTHROSCOPY Right 01/15/2016   Procedure: ARTHROSCOPIC WASHOUT;  Surgeon: Latanya Maudlin, MD;  Location: [facility];  Service: Orthopedics;  Laterality: Right;  . KNEE SURGERY     BILATERAL KNEE ARTHROSCOPY  . left TKR     July 2015  . LUMBAR LAMINECTOMY/DECOMPRESSION MICRODISCECTOMY Left 06/06/2014   Procedure: LEFT L4-5 DECOMPRESSION ;  Surgeon: Melina Schools, MD;  Location: [facility];  Service: Orthopedics;  Laterality: Left;  . REFRACTIVE SURGERY Bilateral   . REIMPLANTATION OF TOTAL KNEE Right 06/09/2016   Procedure: Removal of antibiotic spacer and reimplantation of total knee arthroplasty;  Surgeon: Paralee Cancel, MD;  Location[facility];  Service: Orthopedics;  Laterality: Right;  . SHOULDER SURGERY     RIGHT ROTATOR CUFF REPAIR AND LEFT ARTHROSCOPY  . TOTAL KNEE ARTHROPLASTY Left 08/23/2013   Procedure: LEFT TOTAL KNEE ARTHROPLASTY;  Surgeon: Tobi Bastos, MD;  [facility];  Service: Orthopedics;  Laterality: Left;  . TOTAL KNEE ARTHROPLASTY Right 02/07/2014   Procedure: RIGHT TOTAL KNEE ARTHROPLASTY;  Surgeon: Tobi Bastos, MD;  [facility];  Service: Orthopedics;  Laterality: Right;    Family Psychiatric History: See intake H&P for full details. Reviewed, with no updates at this time.   Family History:  Family History  Problem Relation Age of Onset  .  Hypertension Mother   . Other Mother        varicose veins  . Hypertension Sister     Social History:  Social History   Socioeconomic History  . Marital status: Legally Separated    Spouse name: Not on file  . Number of children: Not on file  . Years of education: Not on file  . Highest education level: Not on file  Social Needs  . Financial resource strain: Not on file  . Food insecurity - worry: Not on file  . Food insecurity - inability: Not on file  . Transportation needs - medical: Not on file  . Transportation needs - non-medical: Not on file  Occupational History  . Not on file  Tobacco Use  . Smoking status: Former Smoker    Packs/day: 1.50    Years: 35.00  Pack years: 52.50    Types: Cigars    Last attempt to quit: 02/22/1998    Years since quitting: 18.9  . Smokeless tobacco: Never Used  . Tobacco comment: used cigarettes 20 years ago  Substance and Sexual Activity  . Alcohol use: No    Comment: " last alcohol use was in December 2015."  . Drug use: No    Comment: Pt states that he hasn't used since 10/2015  . Sexual activity: Yes    Birth control/protection: None    Comment: given flavored condoms per Pt request  Other Topics Concern  . Not on file  Social History Narrative  . Not on file    Allergies:  Allergies  Allergen Reactions  . Cefazolin Other (See Comments)    Patient had a seizure while on cefazolin and no other core cause could be identified other than the beta lactam  . Heparin Other (See Comments)    Low platelets (130s), no HIT testing performed, platelets recovered.  . Wellbutrin [Bupropion] Other (See Comments)    Hallucinations-started on 150mg  daily dosage    Metabolic Disorder Labs: No results found for: HGBA1C, MPG No results found for: PROLACTIN Lab Results  Component Value Date   TRIG 88 05/22/2014   Lab Results  Component Value Date   TSH 6.303 (H) 01/17/2016   TSH 0.206 (L) 12/29/2015    Therapeutic Level  Labs: No results found for: LITHIUM No results found for: VALPROATE No components found for:  CBMZ  Current Medications: Current Outpatient Medications  Medication Sig Dispense Refill  . albuterol (PROVENTIL HFA;VENTOLIN HFA) 108 (90 Base) MCG/ACT inhaler Inhale 2-4 puffs into the lungs daily as needed for shortness of breath.     Marland Kitchen aspirin 325 MG tablet Take 325 mg by mouth 2 (two) times daily.    . bisacodyl (DULCOLAX) 5 MG EC tablet Take 5 mg by mouth 2 (two) times daily as needed (for constipation.).    Marland Kitchen cholecalciferol (VITAMIN D) 1000 units tablet Take 1,000 Units by mouth daily.    Marland Kitchen doxycycline (VIBRA-TABS) 100 MG tablet Take 1 tablet (100 mg total) by mouth every 12 (twelve) hours. 60 tablet 2  . levothyroxine (SYNTHROID, LEVOTHROID) 175 MCG tablet Take 175 mcg by mouth daily at 2 am. 0300    . loperamide (IMODIUM) 2 MG capsule Take 2-4 mg by mouth 3 (three) times daily as needed for diarrhea or loose stools.    . ondansetron (ZOFRAN) 4 MG tablet TAKE 1 TABLET BY MOUTH EVERY 8 HOURS AS NEEDED FOR NAUSEA OR VOMITING. 20 tablet 1  . oxyCODONE 10 MG TABS Take 1-3 tablets (10-30 mg total) by mouth every 4 (four) hours. 30 tablet 0  . pregabalin (LYRICA) 75 MG capsule Take 1 capsule (75 mg total) by mouth 2 (two) times daily. 60 capsule 1  . promethazine (PHENERGAN) 25 MG tablet Take 25 mg by mouth every 6 (six) hours as needed for nausea or vomiting.    Marland Kitchen RAPAFLO 8 MG CAPS capsule Take 8 mg by mouth daily with breakfast.     . senna (SENOKOT) 8.6 MG TABS tablet Take 1 tablet by mouth 2 (two) times daily as needed for mild constipation.    . temazepam (RESTORIL) 15 MG capsule Take 1 capsule (15 mg total) by mouth at bedtime as needed for sleep (take 1-2 capsules at bedtime for sleep). 60 capsule 3  . Tenofovir Alafenamide Fumarate (VEMLIDY) 25 MG TABS Take 1 tablet (25 mg total) by mouth  daily. 30 tablet 11  . testosterone cypionate (DEPOTESTOSTERONE CYPIONATE) 200 MG/ML injection Inject  100 mg into the muscle every 14 (fourteen) days.     . valACYclovir (VALTREX) 1000 MG tablet Take 1 tablet (1,000 mg total) by mouth daily. 30 tablet 11  . venlafaxine XR (EFFEXOR-XR) 150 MG 24 hr capsule Take 2 capsules (300 mg total) by mouth daily. 180 capsule 1   No current facility-administered medications for this visit.      Musculoskeletal: Strength & Muscle Tone: within normal limits Gait & Station: normal Patient leans: N/A  Psychiatric Specialty Exam: ROS  There were no vitals taken for this visit.There is no height or weight on file to calculate BMI.  General Appearance: Casual and Fairly Groomed  Eye Contact:  Fair  Speech:  Clear and Coherent  Volume:  Normal  Mood:  Anxious and Depressed  Affect:  Appropriate and Congruent  Thought Process:  Goal Directed and Descriptions of Associations: Intact  Orientation:  Full (Time, Place, and Person)  Thought Content: Logical and Rumination   Suicidal Thoughts:  No  Homicidal Thoughts:  No  Memory:  Immediate;   Fair  Judgement:  Fair  Insight:  Shallow  Psychomotor Activity:  Normal and Tremor  Concentration:  Concentration: Fair  Recall:  [place] of Knowledge: Fair  Language: Good  Akathisia:  Negative  Handed:  Left  AIMS (if indicated): not done  Assets:  Communication Skills Desire for Improvement Transportation  ADL's:  Intact  Cognition: WNL  Sleep:  Poor   Screenings: PHQ2-9     Office Visit from 05/27/2016 in [facility] for [facility] Office Visit from 04/28/2016 in [facility] for [facility] Office Visit from 03/10/2016 in [facility] for Infectious Disease Office Visit from 10/22/2015 in [facility] for Infectious Disease Office Visit from 09/03/2015 in [facility] for Infectious Disease  PHQ-2 Total Score  0  6  0  0  0  PHQ-9 Total Score  3  9  No data  No data  No data       Assessment and Plan:  Juan Wiggins is a 66 year old male with a history of recurrent major depressive disorder, in the context of severe and generalized chronic pain and neuropathy who presents today for psychiatric med management follow-up for depression and sleep.  He has had partial benefit with Ambien and Xanax at bedtime for sleep, but this effect has gradually dissipated.  We have agreed to start Restoril nightly and discontinue Ambien and Xanax given that he has been off of this medicine for several weeks.  He continues to struggle with pain, depression, and low energy, and we agreed to further increase Effexor to 300 mg XR daily.  He is also agreed to participate in individual therapy in this office, which I suspect will be quite helpful for him as he continues to grieve the loss and estrangement of being apart from his family.   Spent much of our time today providing psychoeducation about medication changes below and processing some of his sense of grief and isolation.  1. Primary insomnia   2. Recurrent major depressive disorder, in partial remission ([place])     Status of current problems: unchanged  Labs Ordered: No orders of the defined types were placed in this encounter.   Labs Reviewed: n/a  Collateral Obtained/Records Reviewed: n/a  Plan:  Increase Effexor to 300 mg XR daily Ambien  and Xanax discontinued Restoril 15-30 mg nightly for sleep Return to clinic in 3 months Individual therapy with Lake Bells  I spent 30 minutes with the patient in direct face-to-face clinical care.  Greater than 50% of this time was spent in counseling and coordination of care with the patient.    Aundra Dubin, MD 01/19/2017, 2:25 PM

## 2017-02-10 ENCOUNTER — Telehealth (HOSPITAL_COMMUNITY): Payer: Self-pay

## 2017-02-10 NOTE — Telephone Encounter (Signed)
Patient is calling, he states that there is no real difference in there Restoril. He wants to know if he can use the alprazolam along with the Restoril as he did with the [place]. Please review and advise,thank you

## 2017-02-10 NOTE — Telephone Encounter (Signed)
Has he tried 2 capsules? If so and that didn't work, we can switch him back to xanax + [place]. Restoril + xanax is not safe

## 2017-02-11 NOTE — Telephone Encounter (Signed)
I called the patient, and he said that he is taking two tablets but he is taking longer to fall asleep and only staying asleep about 2 hours. He is wondering if he can increase the Ambien to 12.5 with the Xanax. Please review and advise, thank you

## 2017-02-12 NOTE — Telephone Encounter (Signed)
Yes that's fine we can switch back to his prior dose of xanax 0.25 mg + 12.5 mg CR ambien - 30 days + 2 refills

## 2017-02-15 MED ORDER — ALPRAZOLAM 0.25 MG PO TABS: 0.2500 mg | ORAL_TABLET | Freq: Every day | ORAL | 2 refills | Status: DC

## 2017-02-15 MED ORDER — ZOLPIDEM TARTRATE ER 12.5 MG PO TBCR: 12.5000 mg | EXTENDED_RELEASE_TABLET | Freq: Every evening | ORAL | 2 refills | Status: DC | PRN

## 2017-02-25 ENCOUNTER — Ambulatory Visit (HOSPITAL_COMMUNITY): Payer: Self-pay | Admitting: Licensed Clinical Social Worker

## 2017-03-02 ENCOUNTER — Ambulatory Visit (HOSPITAL_COMMUNITY): Payer: Self-pay | Admitting: Licensed Clinical Social Worker

## 2017-03-10 ENCOUNTER — Other Ambulatory Visit (HOSPITAL_COMMUNITY): Payer: Self-pay

## 2017-03-10 MED ORDER — ZOLPIDEM TARTRATE 10 MG PO TABS: 10.0000 mg | ORAL_TABLET | Freq: Every evening | ORAL | 2 refills | Status: DC | PRN

## 2017-03-10 MED ORDER — ALPRAZOLAM 0.5 MG PO TABS: 0.5000 mg | ORAL_TABLET | Freq: Every day | ORAL | 2 refills | Status: DC

## 2017-04-22 ENCOUNTER — Encounter (HOSPITAL_COMMUNITY): Payer: Self-pay | Admitting: Psychiatry

## 2017-04-22 ENCOUNTER — Ambulatory Visit (INDEPENDENT_AMBULATORY_CARE_PROVIDER_SITE_OTHER): Payer: 59 | Admitting: Psychiatry

## 2017-04-22 DIAGNOSIS — Z653 Problems related to other legal circumstances: Secondary | ICD-10-CM

## 2017-04-22 DIAGNOSIS — Z630 Problems in relationship with spouse or partner: Secondary | ICD-10-CM | POA: Diagnosis not present

## 2017-04-22 DIAGNOSIS — M0579 Rheumatoid arthritis with rheumatoid factor of multiple sites without organ or systems involvement: Secondary | ICD-10-CM | POA: Diagnosis not present

## 2017-04-22 DIAGNOSIS — F5101 Primary insomnia: Secondary | ICD-10-CM

## 2017-04-22 DIAGNOSIS — F1021 Alcohol dependence, in remission: Secondary | ICD-10-CM | POA: Diagnosis not present

## 2017-04-22 DIAGNOSIS — F3341 Major depressive disorder, recurrent, in partial remission: Secondary | ICD-10-CM | POA: Diagnosis not present

## 2017-04-22 DIAGNOSIS — Z87891 Personal history of nicotine dependence: Secondary | ICD-10-CM

## 2017-04-22 MED ORDER — ZOLPIDEM TARTRATE 10 MG PO TABS: 10.0000 mg | ORAL_TABLET | Freq: Every evening | ORAL | 2 refills | Status: DC | PRN

## 2017-04-22 MED ORDER — ALPRAZOLAM 1 MG PO TABS: 1.0000 mg | ORAL_TABLET | Freq: Every day | ORAL | 2 refills | Status: DC

## 2017-04-22 MED ORDER — VENLAFAXINE HCL ER 150 MG PO CP24: 300.0000 mg | ORAL_CAPSULE | Freq: Every day | ORAL | 1 refills | Status: DC

## 2017-04-22 NOTE — Progress Notes (Signed)
[place] MD/PA/NP OP Progress Note  04/22/2017 1:36 PM Juan Wiggins  MRN:  [id number]  Chief Complaint: sleep slightly better  HPI: Juan Wiggins reports that his sleep is slightly better with the increase in Xanax to 0.5 mg.  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  He has been on probation for many many months, and continues to contend with some of the legal stressors from his DWI 7 years ago.  He may ultimately spent 4 months in prison/jail but this has yet to be decided.  Spent time discussing Xanax increased to 1 mg to assist with increasing sleep beyond 4 hours.  He he was receptive to this, and we again reviewed the risk that this could depress his respirations in combination with opiates, cautioned him against increased risk of falls, neurocognitive symptoms, memory loss.  Weighing the risks and benefits, he reports that sleep is so important for his quality of life so he would like to try taking 1 mg Xanax with Ambien.  He denies any acute safety issues and will follow-up with writer in 3 months.  Visit Diagnosis:    ICD-10-CM   1. Recurrent major depressive disorder, in partial remission (HCC) F33.41 venlafaxine XR (EFFEXOR-XR) 150 MG 24 hr capsule  2. Primary insomnia F51.01   3. Rheumatoid arthritis involving multiple sites with positive rheumatoid factor (HCC) M05.79     Past Psychiatric History: See intake H&P for full details. Reviewed, with no updates at this time.   Past Medical History:  Past Medical History:  Diagnosis Date  . Acute renal failure (ARF) ([place]) 2017   TOOK HEMODIALYSIS X 5 WEEKS   . Anxiety   . Arthritis    RHEUMATOID ARTHRITIS; OA LEFT KNEE  . Cancer ([place])    MELANOMA REMOVED  LT SHOULDER  . Depression   . Family history of adverse reaction to anesthesia    MOTHER AND SISTER SLOW TO WAKE UP  . GERD (gastroesophageal reflux disease)    PREVACID IF NEEDED   . Heart murmur   . Hemorrhoids   . Hepatitis A    PT STATES TYPE OF HEPATITIS YOU GET FROM [facility]  . Hepatitis B   . Hepatitis C   . Hypertension    OFF ALL MEDS LAST 2 MONTHS   . Hypothyroidism   . Inguinal hernia    RIGHT  . 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 Pancreatitis, alcoholic, acute [id number]  . Pneumonia 01/02/2016  . Seizures ([place]) 2017   ONCE WHILE IN HOSPITAL NONE SINCE  . Septic arthritis ([place]) 01/14/2016   RT KNEE  . Sleep apnea    CLAUSTROPHOBIC - COULD NOT TOLERATE CPAP MASK  . Tachycardia    PT STATES HIS HEART RATE USUALLY 100 OR MORE    Past Surgical History:  Procedure Laterality Date  . colonscopy     . ELBOW SURGERY     BILATERAL ELBOW SURGERY  . EXCISIONAL TOTAL KNEE ARTHROPLASTY WITH ANTIBIOTIC SPACERS Right 01/21/2016   Procedure: REMOVAL OF KNEE PROSTESIS WITH RIGHT KNEE PLACEMENT OF ANTIBIOTIC SPACER;  Surgeon: Latanya Maudlin, MD;  Location: [facility];  Service: Orthopedics;  Laterality: Right;  . EXCISIONAL TOTAL KNEE ARTHROPLASTY WITH ANTIBIOTIC SPACERS Right 04/06/2016   Procedure: Right knee repeat irrigation and debridement, antibiotic spacer vers right knee reimplantation right total knee;  Surgeon: Paralee Cancel, MD;  Location: [facility];  Service: Orthopedics;  Laterality: Right;  Requests 2 hours  . EYE SURGERY     Radio [place]  . I&D EXTREMITY Right 01/02/2016   Procedure: IRRIGATION AND DEBRIDEMENT RIGHT TOTAL KNEE;  Surgeon: Latanya Maudlin, MD;  Location: [facility];  Service: Orthopedics;  Laterality: Right;  . I&D KNEE WITH POLY EXCHANGE Right 07/16/2015   Procedure: IRRIGATION AND DEBRIDEMENT RIGHT KNEE WITH POLY EXCHANGE AND PLACEMENT OF ANTIBIOTIC BEADS;  Surgeon: Latanya Maudlin, MD;  Location: [facility];  Service: Orthopedics;  Laterality: Right;  . KNEE ARTHROSCOPY Right 07/14/2015   Procedure: ARTHROSCOPY IRRIGATION AND DEBRIDEMENT - KNEE;  Surgeon: Justice Britain, MD;  Location: [facility];  Service:  Orthopedics;  Laterality: Right;  . KNEE ARTHROSCOPY Right 12/29/2015   Procedure: ARTHROSCOPIC IRRIGATION AND DEBRIEDMENT RIGHT  KNEE;  Surgeon: Melina Schools, MD;  Location: [facility];  Service: Orthopedics;  Laterality: Right;  . KNEE ARTHROSCOPY Right 01/15/2016   Procedure: ARTHROSCOPIC WASHOUT;  Surgeon: Latanya Maudlin, MD;  Location: [facility];  Service: Orthopedics;  Laterality: Right;  . KNEE SURGERY     BILATERAL KNEE ARTHROSCOPY  . left TKR     July 2015  . LUMBAR LAMINECTOMY/DECOMPRESSION MICRODISCECTOMY Left 06/06/2014   Procedure: LEFT L4-5 DECOMPRESSION ;  Surgeon: Melina Schools, MD;  Location: [facility];  Service: Orthopedics;  Laterality: Left;  . REFRACTIVE SURGERY Bilateral   . REIMPLANTATION OF TOTAL KNEE Right 06/09/2016   Procedure: Removal of antibiotic spacer and reimplantation of total knee arthroplasty;  Surgeon: Paralee Cancel, MD;  Location[facility];  Service: Orthopedics;  Laterality: Right;  . SHOULDER SURGERY     RIGHT ROTATOR CUFF REPAIR AND LEFT ARTHROSCOPY  . TOTAL KNEE ARTHROPLASTY Left 08/23/2013   Procedure: LEFT TOTAL KNEE ARTHROPLASTY;  Surgeon: Tobi Bastos, MD;  [facility];  Service: Orthopedics;  Laterality: Left;  . TOTAL KNEE ARTHROPLASTY Right 02/07/2014   Procedure: RIGHT TOTAL KNEE ARTHROPLASTY;  Surgeon: Tobi Bastos, MD;  [facility];  Service: Orthopedics;  Laterality: Right;    Family Psychiatric History: See intake H&P for full details. Reviewed, with no updates at this time.   Family History:  Family History  Problem Relation Age of Onset  . Hypertension Mother   . Other Mother        varicose veins  . Hypertension Sister     Social History:  Social History   Socioeconomic History  . Marital status: Legally Separated    Spouse name: Not on file  . Number of children: Not on file  . Years of education: Not on file  . Highest education level: Not on file  Social Needs  . Financial resource strain: Not on file  . Food  insecurity - worry: Not on file  . Food insecurity - inability: Not on file  . Transportation needs - medical: Not on file  . Transportation needs - non-medical: Not on file  Occupational History  . Not on file  Tobacco Use  . Smoking status: Former Smoker    Packs/day: 1.50    Years: 35.00    Pack years: 52.50    Types: Cigars    Last attempt to quit: 02/22/1998    Years since quitting: 19.1  . Smokeless tobacco: Never Used  . Tobacco comment: used cigarettes 20 years ago  Substance and Sexual Activity  . Alcohol use: No    Comment: " last alcohol use was in December 2015."  . Drug use:  No    Comment: Pt states that he hasn't used since 10/2015  . Sexual activity: Yes    Birth control/protection: None    Comment: given flavored condoms per Pt request  Other Topics Concern  . Not on file  Social History Narrative  . Not on file    Allergies:  Allergies  Allergen Reactions  . Cefazolin Other (See Comments)    Patient had a seizure while on cefazolin and no other core cause could be identified other than the beta lactam  . Heparin Other (See Comments)    Low platelets (130s), no HIT testing performed, platelets recovered.  . Wellbutrin [Bupropion] Other (See Comments)    Hallucinations-started on 150mg  daily dosage    Metabolic Disorder Labs: No results found for: HGBA1C, MPG No results found for: PROLACTIN Lab Results  Component Value Date   TRIG 88 05/22/2014   Lab Results  Component Value Date   TSH 6.303 (H) 01/17/2016   TSH 0.206 (L) 12/29/2015    Therapeutic Level Labs: No results found for: LITHIUM No results found for: VALPROATE No components found for:  CBMZ  Current Medications: Current Outpatient Medications  Medication Sig Dispense Refill  . albuterol (PROVENTIL HFA;VENTOLIN HFA) 108 (90 Base) MCG/ACT inhaler Inhale 2-4 puffs into the lungs daily as needed for shortness of breath.     . ALPRAZolam (XANAX) 1 MG tablet Take 1 tablet (1 mg total)  by mouth at bedtime. 30 tablet 2  . aspirin 325 MG tablet Take 325 mg by mouth 2 (two) times daily.    . bisacodyl (DULCOLAX) 5 MG EC tablet Take 5 mg by mouth 2 (two) times daily as needed (for constipation.).    Marland Kitchen cholecalciferol (VITAMIN D) 1000 units tablet Take 1,000 Units by mouth daily.    Marland Kitchen doxycycline (VIBRA-TABS) 100 MG tablet Take 1 tablet (100 mg total) by mouth every 12 (twelve) hours. 60 tablet 2  . levothyroxine (SYNTHROID, LEVOTHROID) 175 MCG tablet Take 175 mcg by mouth daily at 2 am. 0300    . loperamide (IMODIUM) 2 MG capsule Take 2-4 mg by mouth 3 (three) times daily as needed for diarrhea or loose stools.    . ondansetron (ZOFRAN) 4 MG tablet TAKE 1 TABLET BY MOUTH EVERY 8 HOURS AS NEEDED FOR NAUSEA OR VOMITING. 20 tablet 1  . oxyCODONE 10 MG TABS Take 1-3 tablets (10-30 mg total) by mouth every 4 (four) hours. 30 tablet 0  . pregabalin (LYRICA) 75 MG capsule Take 1 capsule (75 mg total) by mouth 2 (two) times daily. 60 capsule 1  . promethazine (PHENERGAN) 25 MG tablet Take 25 mg by mouth every 6 (six) hours as needed for nausea or vomiting.    Marland Kitchen RAPAFLO 8 MG CAPS capsule Take 8 mg by mouth daily with breakfast.     . senna (SENOKOT) 8.6 MG TABS tablet Take 1 tablet by mouth 2 (two) times daily as needed for mild constipation.    . Tenofovir Alafenamide Fumarate (VEMLIDY) 25 MG TABS Take 1 tablet (25 mg total) by mouth daily. 30 tablet 11  . testosterone cypionate (DEPOTESTOSTERONE CYPIONATE) 200 MG/ML injection Inject 100 mg into the muscle every 14 (fourteen) days.     . valACYclovir (VALTREX) 1000 MG tablet Take 1 tablet (1,000 mg total) by mouth daily. 30 tablet 11  . venlafaxine XR (EFFEXOR-XR) 150 MG 24 hr capsule Take 2 capsules (300 mg total) by mouth daily. 180 capsule 1  . zolpidem (AMBIEN) 10 MG tablet Take  1 tablet (10 mg total) by mouth at bedtime as needed for sleep. 30 tablet 2   No current facility-administered medications for this visit.       Musculoskeletal: Strength & Muscle Tone: within normal limits Gait & Station: normal Patient leans: N/A  Psychiatric Specialty Exam: ROS  There were no vitals taken for this visit.There is no height or weight on file to calculate BMI.  General Appearance: Casual and Fairly Groomed  Eye Contact:  Fair  Speech:  Clear and Coherent  Volume:  Normal  Mood:  Dysphoric and Euthymic  Affect:  Appropriate, Congruent and in chronic pain  Thought Process:  Goal Directed and Descriptions of Associations: Intact  Orientation:  Full (Time, Place, and Person)  Thought Content: Logical   Suicidal Thoughts:  No  Homicidal Thoughts:  No  Memory:  Immediate;   Fair  Judgement:  Fair  Insight:  Fair  Psychomotor Activity:  Normal  Concentration:  Concentration: Fair  Recall:  [employer] of Knowledge: Fair  Language: Fair  Akathisia:  Negative  Handed:  Right  AIMS (if indicated): not done  Assets:  Communication Skills Desire for Improvement Housing Physical Health  ADL's:  Intact  Cognition: WNL  Sleep:  Poor   Screenings: PHQ2-9     Office Visit from 05/27/2016 in [facility] Office Visit from 04/28/2016 in [facility] Office Visit from 03/10/2016 in [facility] Disease Office Visit from 10/22/2015 in [facility] Disease Office Visit from 09/03/2015 in [facility] for Infectious Disease  PHQ-2 Total Score  0  6  0  0  0  PHQ-9 Total Score  3  9  No data  No data  No data       Assessment and Plan:  Juan Wiggins is a 67 year old male with severe chronic pain due to rheumatoid arthritis, neuropathy, osteoarthritis, complicated by multiple additional medical issues including chronic hepatitis B, renal insufficiency, history of arthritic infections.  He has a generally poor social support system, and tends to largely keep to himself.   Xanax in combination with Ambien have provided some improvement in his quality of life in terms of sleep, and he continues on Effexor for depressive symptoms and potential benefits in chronic nerve pain.  1. Recurrent major depressive disorder, in partial remission ([place])   2. Primary insomnia   3. Rheumatoid arthritis involving multiple sites with positive rheumatoid factor (HCC)     Status of current problems: unchanged  Labs Ordered: No orders of the defined types were placed in this encounter.   Labs Reviewed: n/a  Collateral Obtained/Records Reviewed: [facility]  Plan:  Effexor 300 mg XR daily Xanax 1 mg and Ambien 10 mg nightly for sleep The patient understands the risks and benefits of benzodiazepines, hypnotics, and to avoid the use of these medications in combination with opiates  I spent 20 minutes with the patient in direct face-to-face clinical care.  Greater than 50% of this time was spent in counseling and coordination of care with the patient.    Aundra Dubin, MD 04/22/2017, 1:36 PM

## 2017-05-07 ENCOUNTER — Other Ambulatory Visit: Payer: Self-pay | Admitting: Internal Medicine

## 2017-05-18 ENCOUNTER — Other Ambulatory Visit: Payer: Self-pay | Admitting: Internal Medicine

## 2017-06-02 ENCOUNTER — Other Ambulatory Visit: Payer: Self-pay | Admitting: Internal Medicine

## 2017-06-28 ENCOUNTER — Emergency Department (HOSPITAL_COMMUNITY)
Admission: EM | Admit: 2017-06-28 | Discharge: 2017-06-28 | Disposition: A | Discharge status: Home / Self Care | Payer: Medicare Other | Attending: Emergency Medicine | Admitting: Emergency Medicine

## 2017-06-28 ENCOUNTER — Emergency Department (HOSPITAL_COMMUNITY): Payer: Medicare Other

## 2017-06-28 ENCOUNTER — Other Ambulatory Visit: Payer: Self-pay

## 2017-06-28 ENCOUNTER — Encounter (HOSPITAL_COMMUNITY): Payer: Self-pay | Admitting: Obstetrics and Gynecology

## 2017-06-28 DIAGNOSIS — Y929 Unspecified place or not applicable: Secondary | ICD-10-CM | POA: Diagnosis not present

## 2017-06-28 DIAGNOSIS — S61314A Laceration without foreign body of right ring finger with damage to nail, initial encounter: Secondary | ICD-10-CM | POA: Diagnosis present

## 2017-06-28 DIAGNOSIS — Y999 Unspecified external cause status: Secondary | ICD-10-CM | POA: Diagnosis not present

## 2017-06-28 DIAGNOSIS — S62639B Displaced fracture of distal phalanx of unspecified finger, initial encounter for open fracture: Secondary | ICD-10-CM

## 2017-06-28 DIAGNOSIS — W312XXA Contact with powered woodworking and forming machines, initial encounter: Secondary | ICD-10-CM | POA: Insufficient documentation

## 2017-06-28 DIAGNOSIS — Y9389 Activity, other specified: Secondary | ICD-10-CM | POA: Diagnosis not present

## 2017-06-28 DIAGNOSIS — S62633B Displaced fracture of distal phalanx of left middle finger, initial encounter for open fracture: Secondary | ICD-10-CM | POA: Diagnosis not present

## 2017-06-28 MED ORDER — LIDOCAINE HCL 2 % IJ SOLN
10.0000 mL | Freq: Once | INTRAMUSCULAR | Status: AC
  Administered 2017-06-28: 200 mg
  Filled 2017-06-28: qty 20

## 2017-06-28 MED ORDER — DOXYCYCLINE HYCLATE 100 MG PO TABS: 100.0000 mg | ORAL_TABLET | Freq: Two times a day (BID) | ORAL | 0 refills | Status: AC

## 2017-06-28 MED ORDER — TRAMADOL HCL 50 MG PO TABS
50.0000 mg | ORAL_TABLET | Freq: Once | ORAL | Status: AC
  Administered 2017-06-28: 50 mg via ORAL
  Filled 2017-06-28: qty 1

## 2017-06-28 NOTE — Discharge Instructions (Signed)
1. Medications: Tylenol or ibuprofen for pain, doxycyline as prescribed 2. Treatment: ice for swelling, keep wound clean with warm soap and water and keep bandage dry, do not submerge in water for 24 hours 3. Follow Up: Follow up with Dr. Amedeo Plenty for further evaluation and management. Return to the emergency department for increased redness, drainage of pus from the wound   WOUND CARE  Keep area clean and dry for 24 hours. Do not remove bandage, if applied.  After 24 hours, remove bandage and wash wound gently with mild soap and warm water. Reapply a new bandage after cleaning wound, if directed.   Continue daily cleansing with soap and water until stitches are removed.  Do not apply any ointments or creams to the wound while stitches are in place, as this may cause delayed healing. Return if you experience any of the following signs of infection: Swelling, redness, pus drainage, streaking, fever >101.0 F  Return if you experience excessive bleeding that does not stop after 15-20 minutes of constant, firm pressure.

## 2017-06-28 NOTE — ED Provider Notes (Addendum)
[facility] DEPT Provider Note   CSN: [id number] Arrival date & time: 06/28/17  1841     History   Chief Complaint Chief Complaint  Patient presents with  . Laceration    HPI Juan Wiggins is a 67 y.o. male presenting for evaluation of left ring finger injury.  Pt states that just prior to arrival, he was using a saw to cut a board when the board flipped up and the saw cut his finger.  He reports applying a tight dressing and soaking the area and alcohol to try and stop the bleeding.  He reports significant pain of the distal finger.  He denies injury elsewhere.  His tetanus is up-to-date.  He is not on blood thinners.  He is not immunocompromised.  He has not taken anything for pain.  He reports the pain is constant and throbbing.  Nothing makes it better or worse.  HPI  Past Medical History:  Diagnosis Date  . Acute renal failure (ARF) ([place]) 2017   TOOK HEMODIALYSIS X 5 WEEKS   . Anxiety   . Arthritis    RHEUMATOID ARTHRITIS; OA LEFT KNEE  . Cancer ([place])    MELANOMA REMOVED  LT SHOULDER  . Depression   . Family history of adverse reaction to anesthesia    MOTHER AND SISTER SLOW TO WAKE UP  . GERD (gastroesophageal reflux disease)    PREVACID IF NEEDED  . Heart murmur   . Hemorrhoids   . Hepatitis A    PT STATES TYPE OF HEPATITIS YOU GET FROM [facility]  . Hepatitis B   . Hepatitis C   . Hypertension    OFF ALL MEDS LAST 2 MONTHS   . Hypothyroidism   . Inguinal hernia    RIGHT  . 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 Pancreatitis, alcoholic, acute 15/1761  . Pneumonia 01/02/2016  . Seizures ([place]) 2017   ONCE WHILE IN HOSPITAL NONE SINCE  . Septic arthritis ([place]) 01/14/2016   RT KNEE  . Sleep apnea    CLAUSTROPHOBIC - COULD NOT TOLERATE CPAP MASK  . Tachycardia    PT STATES HIS HEART RATE USUALLY 100 OR MORE    Patient Active Problem List   Diagnosis Date Noted  . Status post right knee  replacement 06/10/2016  . S/P right TKA 06/09/2016  . Acquired absence of knee joint following explantation of joint prosthesis with presence of antibiotic-impregnated cement spacer 04/06/2016  . S/P total knee arthroplasty 04/06/2016  . Infection of prosthetic right knee joint ([place]) 04/06/2016  . Chronic renal insufficiency, stage 3 (moderate) ([place]) 04/06/2016  . Rheumatoid arthritis ([place]) 04/06/2016  . Unintentional weight loss 04/06/2016  . Obstructive sleep apnea 02/11/2016  . Hx of mixed drug abuse 01/29/2016  . Seizure ([place])   . IVDU (intravenous drug user)   . Staphylococcal arthritis of right knee ([place])   . 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 Chronic hepatitis C without hepatic coma ([place]) 07/18/2015  . Normocytic anemia 07/18/2015  . Opacity of lung on imaging study, bilateral 07/18/2015  . Anxiety state 07/18/2015  . Chronic pain syndrome 05/21/2014  . Essential hypertension 08/28/2013  . GERD (gastroesophageal reflux disease) 08/28/2013  . Osteoarthritis of left knee 08/23/2013  . Alcohol dependence ([place]) 07/14/2013  . Major depression 07/07/2013  . Hypothyroidism 04/13/2011    Past Surgical History:  Procedure Laterality Date  . colonscopy     .  ELBOW SURGERY     BILATERAL ELBOW SURGERY  . EXCISIONAL TOTAL KNEE ARTHROPLASTY WITH ANTIBIOTIC SPACERS Right 01/21/2016   Procedure: REMOVAL OF KNEE PROSTESIS WITH RIGHT KNEE PLACEMENT OF ANTIBIOTIC SPACER;  Surgeon: Latanya Maudlin, MD;  Location: [facility];  Service: Orthopedics;  Laterality: Right;  . EXCISIONAL TOTAL KNEE ARTHROPLASTY WITH ANTIBIOTIC SPACERS Right 04/06/2016   Procedure: Right knee repeat irrigation and debridement, antibiotic spacer vers right knee reimplantation right total knee;  Surgeon: Paralee Cancel, MD;  Location: [facility];  Service: Orthopedics;  Laterality: Right;  Requests 2 hours  . EYE SURGERY     [facility] [place]  . I&D EXTREMITY Right 01/02/2016   Procedure: IRRIGATION  AND DEBRIDEMENT RIGHT TOTAL KNEE;  Surgeon: Latanya Maudlin, MD;  Location: [facility];  Service: Orthopedics;  Laterality: Right;  . I&D KNEE WITH POLY EXCHANGE Right 07/16/2015   Procedure: IRRIGATION AND DEBRIDEMENT RIGHT KNEE WITH POLY EXCHANGE AND PLACEMENT OF ANTIBIOTIC BEADS;  Surgeon: Latanya Maudlin, MD;  Location: [facility];  Service: Orthopedics;  Laterality: Right;  . KNEE ARTHROSCOPY Right 07/14/2015   Procedure: ARTHROSCOPY IRRIGATION AND DEBRIDEMENT - KNEE;  Surgeon: Justice Britain, MD;  Location: [facility];  Service: Orthopedics;  Laterality: Right;  . KNEE ARTHROSCOPY Right 12/29/2015   Procedure: ARTHROSCOPIC IRRIGATION AND DEBRIEDMENT RIGHT  KNEE;  Surgeon: Melina Schools, MD;  Location: [facility];  Service: Orthopedics;  Laterality: Right;  . KNEE ARTHROSCOPY Right 01/15/2016   Procedure: ARTHROSCOPIC WASHOUT;  Surgeon: Latanya Maudlin, MD;  Location: [facility];  Service: Orthopedics;  Laterality: Right;  . KNEE SURGERY     BILATERAL KNEE ARTHROSCOPY  . left TKR     July 2015  . LUMBAR LAMINECTOMY/DECOMPRESSION MICRODISCECTOMY Left 06/06/2014   Procedure: LEFT L4-5 DECOMPRESSION ;  Surgeon: Melina Schools, MD;  Location: [facility];  Service: Orthopedics;  Laterality: Left;  . REFRACTIVE SURGERY Bilateral   . REIMPLANTATION OF TOTAL KNEE Right 06/09/2016   Procedure: Removal of antibiotic spacer and reimplantation of total knee arthroplasty;  Surgeon: Paralee Cancel, MD;  Location: [facility];  Service: Orthopedics;  Laterality: Right;  . SHOULDER SURGERY     RIGHT ROTATOR CUFF REPAIR AND LEFT ARTHROSCOPY  . TOTAL KNEE ARTHROPLASTY Left 08/23/2013   Procedure: LEFT TOTAL KNEE ARTHROPLASTY;  Surgeon: Tobi Bastos, MD;  Location[facility];  Service: Orthopedics;  Laterality: Left;  . TOTAL KNEE ARTHROPLASTY Right 02/07/2014   Procedure: RIGHT TOTAL KNEE ARTHROPLASTY;  Surgeon: Tobi Bastos, MD;  Location: [facility];  Service: Orthopedics;  Laterality: Right;        Home Medications    Prior to Admission  medications   Medication Sig Start Date End Date Taking? Authorizing Provider  albuterol (PROVENTIL HFA;VENTOLIN HFA) 108 (90 Base) MCG/ACT inhaler Inhale 2-4 puffs into the lungs daily as needed for shortness of breath.  04/25/15   [provider]  ALPRAZolam Duanne Moron) 1 MG tablet Take 1 tablet (1 mg total) by mouth at bedtime. 04/22/17 04/22/18  Aundra Dubin, MD  aspirin 325 MG tablet Take 325 mg by mouth 2 (two) times daily.    [provider]  bisacodyl (DULCOLAX) 5 MG EC tablet Take 5 mg by mouth 2 (two) times daily as needed (for constipation.).    [provider]  cholecalciferol (VITAMIN D) 1000 units tablet Take 1,000 Units by mouth daily.    [provider]  doxycycline (VIBRA-TABS) 100 MG tablet Take 1 tablet (100 mg total) by mouth every 12 (twelve) hours for 10 days. 06/28/17  07/08/17  Shayn Madole, PA-C  levothyroxine (SYNTHROID, LEVOTHROID) 175 MCG tablet Take 175 mcg by mouth daily at 2 am. 0300    [provider]  loperamide (IMODIUM) 2 MG capsule Take 2-4 mg by mouth 3 (three) times daily as needed for diarrhea or loose stools.    [provider]  ondansetron (ZOFRAN) 4 MG tablet TAKE 1 TABLET BY MOUTH EVERY 8 HOURS AS NEEDED FOR NAUSEA OR VOMITING. 06/02/17   Carlyle Basques, MD  oxyCODONE 10 MG TABS Take 1-3 tablets (10-30 mg total) by mouth every 4 (four) hours. 06/11/16   Danae Orleans, PA-C  pregabalin (LYRICA) 75 MG capsule Take 1 capsule (75 mg total) by mouth 2 (two) times daily. 09/01/16   Carlyle Basques, MD  promethazine (PHENERGAN) 25 MG tablet Take 25 mg by mouth every 6 (six) hours as needed for nausea or vomiting.    [provider]  RAPAFLO 8 MG CAPS capsule Take 8 mg by mouth daily with breakfast.  03/09/16   [provider]  senna (SENOKOT) 8.6 MG TABS tablet Take 1 tablet by mouth 2 (two) times daily as needed for mild constipation.    [provider]  Tenofovir Alafenamide  Fumarate (VEMLIDY) 25 MG TABS Take 1 tablet (25 mg total) by mouth daily. 07/16/16   Carlyle Basques, MD  testosterone cypionate (DEPOTESTOSTERONE CYPIONATE) 200 MG/ML injection Inject 100 mg into the muscle every 14 (fourteen) days.  03/26/16   [provider]  valACYclovir (VALTREX) 1000 MG tablet TAKE 1 TABLET (1,000 MG TOTAL) BY MOUTH DAILY. 06/02/17   Carlyle Basques, MD  venlafaxine XR (EFFEXOR-XR) 150 MG 24 hr capsule Take 2 capsules (300 mg total) by mouth daily. 04/22/17 04/22/18  Aundra Dubin, MD  zolpidem (AMBIEN) 10 MG tablet Take 1 tablet (10 mg total) by mouth at bedtime as needed for sleep. 04/22/17 05/22/17  Aundra Dubin, MD    Family History Family History  Problem Relation Age of Onset  . Hypertension Mother   . Juan Mother        varicose veins  . Hypertension Sister     Social History Social History   Tobacco Use  . Smoking status: Former Smoker    Packs/day: 1.50    Years: 35.00    Pack years: 52.50    Types: Cigars    Last attempt to quit: 02/22/1998    Years since quitting: 19.3  . Smokeless tobacco: Never Used  . Tobacco comment: used cigarettes 20 years ago  Substance Use Topics  . Alcohol use: Yes    Comment: last drink today  . Drug use: No    Types: Cocaine    Comment: Pt states that he hasn't used since 10/2015     Allergies   Cefazolin; Heparin; and Wellbutrin [bupropion]   Review of Systems Review of Systems  Skin: Positive for wound.  Neurological: Negative for numbness.  Hematological: Does not bruise/bleed easily.     Physical Exam Updated Vital Signs BP (!) 163/98 (BP Location: Right Arm)   Pulse (!) 103   Temp 98.3 F (36.8 C) (Oral)   Resp 20   Ht 5' 11.5" (1.816 m)   Wt 87.5 kg (193 lb)   SpO2 99%   BMI 26.54 kg/m   Physical Exam  Constitutional: He is oriented to person, place, and time. He appears well-developed and well-nourished. No distress.  HENT:  Head: Normocephalic and atraumatic.    Eyes: EOM are normal.  Neck: Normal range of  motion.  Pulmonary/Chest: Effort normal.  Abdominal: He exhibits no distension.  Musculoskeletal: Normal range of motion. He exhibits tenderness.  Neurological: He is alert and oriented to person, place, and time. No sensory deficit.  Skin:  Complex injury of the distal left finger with 2 lacerations extending through the nail and into the nailbed.  Minimal bleeding.  No obvious injury noted elsewhere.  Full range of motion of the finger.  Sensation intact. Good cap refill  Psychiatric: He has a normal mood and affect.  Nursing note and vitals reviewed.    ED Treatments / Results  Labs (all labs ordered are listed, but only abnormal results are displayed) Labs Reviewed - No data to display  EKG None  Radiology Dg Finger Ring Left  Result Date: 06/28/2017 CLINICAL DATA:  67 year old male with injury to the left fourth digit. EXAM: LEFT RING FINGER 2+V COMPARISON:  None. FINDINGS: There is a mildly displaced transverse fracture of the tuft of the distal phalanx of the fourth digit. The bones are osteopenic. There is no dislocation. There is mild soft tissue swelling of the fourth digit. No radiopaque foreign object or soft tissue gas. IMPRESSION: Mildly displaced fracture of the tuft of the distal phalanx of the fourth digit. Electronically Signed   By: Anner Crete M.D.   On: 06/28/2017 22:15    Procedures .Nail Removal Date/Time: 06/28/2017 10:03 PM Performed by: Franchot Heidelberg, PA-C Authorized by: Franchot Heidelberg, PA-C   Consent:    Consent obtained:  Verbal   Consent given by:  Patient   Risks discussed:  Bleeding, incomplete removal, infection, pain and permanent nail deformity Location:    Hand:  L ring finger Anesthesia (see MAR for exact dosages):    Anesthesia method:  Nerve block   Block location:  L ring finger digial block   Block needle gauge:  25 G   Block anesthetic:  Lidocaine 2% w/o epi   Block injection  procedure:  Anatomic landmarks identified, introduced needle, incremental injection and negative aspiration for blood   Block outcome:  Anesthesia achieved Nail Removal:    Nail removed:  Complete   Nail bed repaired: yes     Nail bed repair material:  5-0 fast absorbing plain gut   Number of sutures:  6   Removed nail replaced and anchored: no   Post-procedure details:    Dressing:  Splint   Patient tolerance of procedure:  Tolerated well, no immediate complications Comments:     Complex injury of L ring finger involving 2 lacerations through the nail and into the nail bed of the L distal finger. Nail removed and nail bed repaired. Pt tolerated well.    (including critical care time)  Medications Ordered in ED Medications  lidocaine (XYLOCAINE) 2 % (with pres) injection 200 mg (has no administration in time range)     Initial Impression / Assessment and Plan / ED Course  I have reviewed the triage vital signs and the nursing notes.  Pertinent labs & imaging results that were available during my care of the patient were reviewed by me and considered in my medical decision making (see chart for details).     Patient presenting for evaluation of distal left finger injury.  Physical exam shows patient who is neurovascularly intact with a complex injury of the distal left finger.  Digital block performed.  Nail fragments were removed, the nailbed was repaired.  X-ray obtained which shows distal tuft fracture.  Will place patient on antibiotics as  he has an open tuft fracture and have him follow-up with Dr. Amedeo Plenty.  Will place splint on finger for fracture. At this time, pt appears safe for d/c. Return precautions given. Pt states he understands and agrees to plan.   Final Clinical Impressions(s) / ED Diagnoses   Final diagnoses:  Open fracture of tuft of distal phalanx of finger    ED Discharge Orders        Ordered    doxycycline (VIBRA-TABS) 100 MG tablet  Every 12 hours      06/28/17 2201       Franchot Heidelberg, PA-C 06/28/17 2212    Franchot Heidelberg, PA-C 06/28/17 2219    Dorie Rank, MD 06/30/17 [phone]

## 2017-06-28 NOTE — ED Triage Notes (Signed)
Per Pt: Pt was planning a board and the board flipped up and cut his finger. Pt reports he wrapped the finger in wet guaze with alcohol and had the bleeding controlled.  Pt reports 10/10 in pain.

## 2017-07-14 ENCOUNTER — Telehealth (HOSPITAL_COMMUNITY): Payer: Self-pay

## 2017-07-14 NOTE — Telephone Encounter (Signed)
[facility] in [place] is free, although he may want to consider [facility] if his insurance will cover this

## 2017-07-14 NOTE — Telephone Encounter (Signed)
Patient called and said that he has a court order to go to a rehab facility and he wants input from you as where he should go. Please advise

## 2017-07-15 NOTE — Telephone Encounter (Signed)
Called and left voicemail message for call back to office.

## 2017-07-16 ENCOUNTER — Other Ambulatory Visit: Payer: Self-pay | Admitting: Behavioral Health

## 2017-07-16 ENCOUNTER — Other Ambulatory Visit: Payer: Self-pay | Admitting: Internal Medicine

## 2017-07-16 DIAGNOSIS — B009 Herpesviral infection, unspecified: Secondary | ICD-10-CM

## 2017-07-16 MED ORDER — VALACYCLOVIR HCL 1 G PO TABS: 1000.0000 mg | ORAL_TABLET | Freq: Every day | ORAL | 0 refills | Status: DC

## 2017-07-20 ENCOUNTER — Telehealth (HOSPITAL_COMMUNITY): Payer: Self-pay

## 2017-07-20 ENCOUNTER — Encounter (HOSPITAL_COMMUNITY): Payer: Self-pay | Admitting: Psychiatry

## 2017-07-20 ENCOUNTER — Ambulatory Visit (INDEPENDENT_AMBULATORY_CARE_PROVIDER_SITE_OTHER): Payer: Medicare Other | Admitting: Psychiatry

## 2017-07-20 VITALS — BP 144/85 | HR 93 | Ht 71.5 in | Wt 192.8 lb

## 2017-07-20 DIAGNOSIS — Z650 Conviction in civil and criminal proceedings without imprisonment: Secondary | ICD-10-CM

## 2017-07-20 DIAGNOSIS — Z87891 Personal history of nicotine dependence: Secondary | ICD-10-CM

## 2017-07-20 DIAGNOSIS — F1021 Alcohol dependence, in remission: Secondary | ICD-10-CM

## 2017-07-20 DIAGNOSIS — F3341 Major depressive disorder, recurrent, in partial remission: Secondary | ICD-10-CM

## 2017-07-20 DIAGNOSIS — M0579 Rheumatoid arthritis with rheumatoid factor of multiple sites without organ or systems involvement: Secondary | ICD-10-CM | POA: Diagnosis not present

## 2017-07-20 DIAGNOSIS — Z79899 Other long term (current) drug therapy: Secondary | ICD-10-CM | POA: Diagnosis not present

## 2017-07-20 MED ORDER — ZOLPIDEM TARTRATE 10 MG PO TABS: 10.0000 mg | ORAL_TABLET | Freq: Every evening | ORAL | 2 refills | Status: AC | PRN

## 2017-07-20 MED ORDER — VENLAFAXINE HCL ER 150 MG PO CP24: 300.0000 mg | ORAL_CAPSULE | Freq: Every day | ORAL | 1 refills | Status: AC

## 2017-07-20 MED ORDER — ALPRAZOLAM 1 MG PO TABS: 1.0000 mg | ORAL_TABLET | Freq: Every day | ORAL | 2 refills | Status: AC

## 2017-07-20 NOTE — Telephone Encounter (Signed)
Received a new prescription request for Zolpidem 10mg  tabs. Patient has an appointment scheduled for today 07/20/17. Pharmacy is [facility].  Fax number is [phone]. Please advise.

## 2017-07-20 NOTE — Telephone Encounter (Signed)
Oh good, I will see him today and take care of sending it in, thank you!

## 2017-07-20 NOTE — Progress Notes (Signed)
BH MD/PA/NP OP Progress Note  07/20/2017 2:35 PM Juan Wiggins  MRN:  [id number]  Chief Complaint:  Need help with a referral for substance abuse program HPI: Juan Wiggins reports that he had his court date a few weeks ago and he was given the option of a 120 days in prison/jail or participation in a inpatient residential treatment program for mental health/substance abuse.. This is a result of his DWIs from approximately 5 years ago.  He was receptive to a referral to [facility] in [place].  Costs and financial constraints are a contributing factor, along with his ongoing chronic pain.  I spent time with him processing some of his grief and confusion surrounding this court requirement.  I also suggest that he look into participation in [facility] in [place].    Visit Diagnosis:    ICD-10-CM   1. Rheumatoid arthritis involving multiple sites with positive rheumatoid factor (HCC) M05.79   2. Recurrent major depressive disorder, in partial remission (HCC) F33.41 venlafaxine XR (EFFEXOR-XR) 150 MG 24 hr capsule  3. Alcohol use disorder, severe, in sustained remission ([place]) F10.21     Past Psychiatric History: See intake H&P for full details. Reviewed, with no updates at this time.   Past Medical History:  Past Medical History:  Diagnosis Date  . Acute renal failure (ARF) ([place]) 2017   TOOK HEMODIALYSIS X 5 WEEKS   . Anxiety   . Arthritis    RHEUMATOID ARTHRITIS; OA LEFT KNEE  . Cancer ([place])    MELANOMA REMOVED  LT SHOULDER  . Depression   . Family history of adverse reaction to anesthesia    MOTHER AND SISTER SLOW TO WAKE UP  . GERD (gastroesophageal reflux disease)    PREVACID IF NEEDED  . Heart murmur   . Hemorrhoids   . Hepatitis A    PT STATES TYPE OF HEPATITIS YOU GET FROM [place]  . Hepatitis B   . Hepatitis C   . Hypertension    OFF ALL MEDS LAST 2 MONTHS   . Hypothyroidism   . Inguinal hernia    RIGHT  . Lower back pain    TOLD SCIATIC NERVE PINCHED - MAY NEED SURGERY IN FUTURE   . Pancreatitis, alcoholic, acute 09/6576  . Pneumonia 01/02/2016  . Seizures ([place]) 2017   ONCE WHILE IN HOSPITAL NONE SINCE  . Septic arthritis ([place]) 01/14/2016   RT KNEE  . Sleep apnea    CLAUSTROPHOBIC - COULD NOT TOLERATE CPAP MASK  . Tachycardia    PT STATES HIS HEART RATE USUALLY 100 OR MORE    Past Surgical History:  Procedure Laterality Date  . colonscopy     . ELBOW SURGERY     BILATERAL ELBOW SURGERY  . EXCISIONAL TOTAL KNEE ARTHROPLASTY WITH ANTIBIOTIC SPACERS Right 01/21/2016   Procedure: REMOVAL OF KNEE PROSTESIS WITH RIGHT KNEE PLACEMENT OF ANTIBIOTIC SPACER;  Surgeon: Latanya Maudlin, MD;  Location: [facility];  Service: Orthopedics;  Laterality: Right;  . EXCISIONAL TOTAL KNEE ARTHROPLASTY WITH ANTIBIOTIC SPACERS Right 04/06/2016   Procedure: Right knee repeat irrigation and debridement, antibiotic spacer vers right knee reimplantation right total knee;  Surgeon: Paralee Cancel, MD;  Location: [facility];  Service: Orthopedics;  Laterality: Right;  Requests 2 hours  . EYE SURGERY     Radio [place]  . I&D EXTREMITY Right 01/02/2016   Procedure: IRRIGATION AND DEBRIDEMENT RIGHT TOTAL KNEE;  Surgeon: Latanya Maudlin, MD;  Location: [facility];  Service: Orthopedics;  Laterality: Right;  . I&D KNEE  WITH POLY EXCHANGE Right 07/16/2015   Procedure: IRRIGATION AND DEBRIDEMENT RIGHT KNEE WITH POLY EXCHANGE AND PLACEMENT OF ANTIBIOTIC BEADS;  Surgeon: Latanya Maudlin, MD;  Location: [facility];  Service: Orthopedics;  Laterality: Right;  . KNEE ARTHROSCOPY Right 07/14/2015   Procedure: ARTHROSCOPY IRRIGATION AND DEBRIDEMENT - KNEE;  Surgeon: Justice Britain, MD;  Location: [facility];  Service: Orthopedics;  Laterality: Right;  . KNEE ARTHROSCOPY Right 12/29/2015   Procedure: ARTHROSCOPIC IRRIGATION AND DEBRIEDMENT RIGHT  KNEE;  Surgeon: Melina Schools, MD;  Location: [facility];  Service: Orthopedics;  Laterality: Right;  . KNEE ARTHROSCOPY Right 01/15/2016   Procedure: ARTHROSCOPIC WASHOUT;  Surgeon: Latanya Maudlin, MD;  Location: [facility];  Service: Orthopedics;  Laterality: Right;  . KNEE SURGERY     BILATERAL KNEE ARTHROSCOPY  . left TKR     July 2015  . LUMBAR LAMINECTOMY/DECOMPRESSION MICRODISCECTOMY Left 06/06/2014   Procedure: LEFT L4-5 DECOMPRESSION ;  Surgeon: Melina Schools, MD;  Location: [facility];  Service: Orthopedics;  Laterality: Left;  . REFRACTIVE SURGERY Bilateral   . REIMPLANTATION OF TOTAL KNEE Right 06/09/2016   Procedure: Removal of antibiotic spacer and reimplantation of total knee arthroplasty;  Surgeon: Paralee Cancel, MD;  [facility];  Service: Orthopedics;  Laterality: Right;  . SHOULDER SURGERY     RIGHT ROTATOR CUFF REPAIR AND LEFT ARTHROSCOPY  . TOTAL KNEE ARTHROPLASTY Left 08/23/2013   Procedure: LEFT TOTAL KNEE ARTHROPLASTY;  Surgeon: Tobi Bastos, MD;  [facility];  Service: Orthopedics;  Laterality: Left;  . TOTAL KNEE ARTHROPLASTY Right 02/07/2014   Procedure: RIGHT TOTAL KNEE ARTHROPLASTY;  Surgeon: Tobi Bastos, MD;  [facility];  Service: Orthopedics;  Laterality: Right;    Family Psychiatric History: See intake H&P for full details. Reviewed, with no updates at this time.   Family History:  Family History  Problem Relation Age of Onset  . Hypertension Mother   . Other Mother        varicose veins  . Hypertension Sister     Social History:  Social History   Socioeconomic History  . Marital status: Legally Separated    Spouse name: Not on file  . Number of children: Not on file  . Years of education: Not on file  . Highest education level: Not on file  Occupational History  . Not on file  Social Needs  . Financial resource strain: Not on file  . Food insecurity:    Worry: Not on file    Inability: Not on file  . Transportation needs:    Medical: Not on file    Non-medical: Not on file  Tobacco Use  . Smoking status: Former Smoker    Packs/day: 1.50    Years: 35.00    Pack years: 52.50    Types: Cigars    Last attempt  to quit: 02/22/1998    Years since quitting: 19.4  . Smokeless tobacco: Never Used  . Tobacco comment: used cigarettes 20 years ago  Substance and Sexual Activity  . Alcohol use: Yes    Comment: last drink today  . Drug use: No    Types: Cocaine    Comment: Pt states that he hasn't used since 10/2015  . Sexual activity: Yes    Birth control/protection: None    Comment: given flavored condoms per Pt request  Lifestyle  . Physical activity:    Days per week: Not on file    Minutes per session: Not on file  . Stress: Not  on file  Relationships  . Social connections:    Talks on phone: Not on file    Gets together: Not on file    Attends religious service: Not on file    Active member of club or organization: Not on file    Attends meetings of clubs or organizations: Not on file    Relationship status: Not on file  Other Topics Concern  . Not on file  Social History Narrative  . Not on file    Allergies:  Allergies  Allergen Reactions  . Cefazolin Other (See Comments)    Patient had a seizure while on cefazolin and no other core cause could be identified other than the beta lactam  . Heparin Other (See Comments)    Low platelets (130s), no HIT testing performed, platelets recovered.  . Wellbutrin [Bupropion] Other (See Comments)    Hallucinations-started on 150mg  daily dosage    Metabolic Disorder Labs: No results found for: HGBA1C, MPG No results found for: PROLACTIN Lab Results  Component Value Date   TRIG 88 05/22/2014   Lab Results  Component Value Date   TSH 6.303 (H) 01/17/2016   TSH 0.206 (L) 12/29/2015    Therapeutic Level Labs: No results found for: LITHIUM No results found for: VALPROATE No components found for:  CBMZ  Current Medications: Current Outpatient Medications  Medication Sig Dispense Refill  . albuterol (PROVENTIL HFA;VENTOLIN HFA) 108 (90 Base) MCG/ACT inhaler Inhale 2-4 puffs into the lungs daily as needed for shortness of breath.      . ALPRAZolam (XANAX) 1 MG tablet Take 1 tablet (1 mg total) by mouth at bedtime. 30 tablet 2  . aspirin 325 MG tablet Take 325 mg by mouth 2 (two) times daily.    . bisacodyl (DULCOLAX) 5 MG EC tablet Take 5 mg by mouth 2 (two) times daily as needed (for constipation.).    Marland Kitchen cholecalciferol (VITAMIN D) 1000 units tablet Take 1,000 Units by mouth daily.    Marland Kitchen DOXYCYCLINE HYCLATE PO Take by mouth.    . levothyroxine (SYNTHROID, LEVOTHROID) 175 MCG tablet Take 175 mcg by mouth daily at 2 am. 0300    . loperamide (IMODIUM) 2 MG capsule Take 2-4 mg by mouth 3 (three) times daily as needed for diarrhea or loose stools.    . ondansetron (ZOFRAN) 4 MG tablet TAKE 1 TABLET BY MOUTH EVERY 8 HOURS AS NEEDED FOR NAUSEA OR VOMITING. 20 tablet 1  . oxyCODONE 10 MG TABS Take 1-3 tablets (10-30 mg total) by mouth every 4 (four) hours. 30 tablet 0  . pregabalin (LYRICA) 75 MG capsule Take 1 capsule (75 mg total) by mouth 2 (two) times daily. 60 capsule 1  . promethazine (PHENERGAN) 25 MG tablet Take 25 mg by mouth every 6 (six) hours as needed for nausea or vomiting.    Marland Kitchen RAPAFLO 8 MG CAPS capsule Take 8 mg by mouth daily with breakfast.     . senna (SENOKOT) 8.6 MG TABS tablet Take 1 tablet by mouth 2 (two) times daily as needed for mild constipation.    Marland Kitchen testosterone cypionate (DEPOTESTOSTERONE CYPIONATE) 200 MG/ML injection Inject 100 mg into the muscle every 14 (fourteen) days.     . valACYclovir (VALTREX) 1000 MG tablet Take 1 tablet (1,000 mg total) by mouth daily. 30 tablet 0  . VEMLIDY 25 MG TABS TAKE 1 TABLET BY MOUTH EVERY DAY 30 tablet 0  . venlafaxine XR (EFFEXOR-XR) 150 MG 24 hr capsule Take 2 capsules (300 mg  total) by mouth daily. 180 capsule 1  . zolpidem (AMBIEN) 10 MG tablet Take 1 tablet (10 mg total) by mouth at bedtime as needed for sleep. 30 tablet 2   No current facility-administered medications for this visit.      Musculoskeletal: Strength & Muscle Tone: within normal limits Gait &  Station: painful gait, overall steady Patient leans: N/A  Psychiatric Specialty Exam: ROS  Blood pressure (!) 144/85, pulse 93, height 5' 11.5" (1.816 m), weight 192 lb 12.8 oz (87.5 kg), SpO2 92 %.Body mass index is 26.52 kg/m.  General Appearance: Casual and Fairly Groomed  Eye Contact:  Good  Speech:  Clear and Coherent and Normal Rate  Volume:  Normal  Mood:  Anxious and Euthymic  Affect:  Congruent  Thought Process:  Coherent and Descriptions of Associations: Intact  Orientation:  Full (Time, Place, and Person)  Thought Content: Logical   Suicidal Thoughts:  No  Homicidal Thoughts:  No  Memory:  Recent;   Good  Judgement:  Good  Insight:  Fair  Psychomotor Activity:  Normal  Concentration:  Attention Span: Good  Recall:  Good  Fund of Knowledge: Good  Language: Good  Akathisia:  Negative  Handed:  Right  AIMS (if indicated): not done  Assets:  Communication Skills Desire for Improvement Financial Resources/Insurance Housing  ADL's:  Intact  Cognition: WNL  Sleep:  Good   Screenings: PHQ2-9     Office Visit from 05/27/2016 in [facility] Office Visit from 04/28/2016 in [facility] for Infectious Disease Office Visit from 03/10/2016 in [facility] for Infectious Disease Office Visit from 10/22/2015 in [facility] for Infectious Disease Office Visit from 09/03/2015 in [facility] for Infectious Disease  PHQ-2 Total Score  0  6  0  0  0  PHQ-9 Total Score  3  9  -  -  -       Assessment and Plan: Juan Wiggins is a 67 year old male with a history of major depressive disorder in partial remission, chronic pain, alcohol use disorder in remission for approximately 18 months, who presents today for psychiatric follow-up visit.  He has had a lengthy trial Regarding a DWI from 5 years ago.  He has had his final sentence as of a few weeks ago, and is required to participate in an  inpatient mental health program with treatment for substance abuse.  The alternative to this would be his going to 120 days of jail/prison.  He presents as quite reasonable today, and would like to glean value and benefit from participating in a substance abuse treatment program.  He reflects with shame on his DWI, and is hopeful that he can do what is necessary to be able to put this part of his past behind him.   He understands Probation officer is transitioning out of this office at the end of August, and he will follow-up with Dr. Casimiro Needle in 3-4 months when he returns from mental health hospitalization, or participation in substance abuse program.  No acute safety issues.   1. Rheumatoid arthritis involving multiple sites with positive rheumatoid factor ([place])   2. Recurrent major depressive disorder, in partial remission ([place])   3. Alcohol use disorder, severe, in sustained remission ([place])     Status of current problems: unchanged  Labs Ordered: No orders of the defined types were placed in this encounter.   Labs Reviewed: NA  Collateral Obtained/Records Reviewed:  NA  Plan:  Referral to inpatient mental health substance abuse program; the patient is not actively abusing substances, but would benefit from participation in such a program so as to glean benefit and furthering his long-term goals of sobriety Continue alprazolam 1 mg nightly for sleep, along with Ambien 10 mg nightly Patient understands that any inpatient facility he participates in will likely taper and discontinue the above medications Continue Effexor XR 300 mg daily  I spent 25 minutes with the patient in direct face-to-face clinical care.  Greater than 50% of this time was spent in counseling and coordination of care with the patient.    Aundra Dubin, MD 07/20/2017, 2:35 PM

## 2017-07-20 NOTE — Telephone Encounter (Signed)
Thank you :)

## 2017-07-29 ENCOUNTER — Telehealth: Payer: Self-pay

## 2017-07-29 NOTE — Telephone Encounter (Signed)
PA for pt's Vemlidy 25 mg tab was sent today 07/29/17. Waiting on [employer] to respond on [place], [place]

## 2017-08-11 ENCOUNTER — Other Ambulatory Visit: Payer: Self-pay | Admitting: Internal Medicine

## 2017-08-11 DIAGNOSIS — B009 Herpesviral infection, unspecified: Secondary | ICD-10-CM

## 2017-08-12 ENCOUNTER — Telehealth: Payer: Self-pay | Admitting: *Deleted

## 2017-08-12 ENCOUNTER — Encounter: Payer: Self-pay | Admitting: Pharmacist Clinician (PhC)/ Clinical Pharmacy Specialist

## 2017-08-12 ENCOUNTER — Other Ambulatory Visit: Payer: Self-pay | Admitting: Pharmacist Clinician (PhC)/ Clinical Pharmacy Specialist

## 2017-08-12 MED ORDER — TENOFOVIR ALAFENAMIDE FUMARATE 25 MG PO TABS: 1.0000 | ORAL_TABLET | Freq: Every day | ORAL | 3 refills | Status: DC

## 2017-08-12 NOTE — Progress Notes (Signed)
Enough refills until the next appt. He can't come until Oct due being in a rehab facility in [place].

## 2017-08-12 NOTE — Telephone Encounter (Signed)
Patient called to speak with [facility] but he was unavailable and advised will give him the message to call once he has time. Patient gave call back as [phone] (Temp number)

## 2017-08-12 NOTE — Progress Notes (Signed)
Yes on the refills.

## 2017-08-12 NOTE — Telephone Encounter (Signed)
PT called today stating he is out of meds and was not sure what to do next. Stated he has been out of meds for seven days and cannot get a refill since he has a court order to finish 120 days of rehab in [place]. Spoke with [facility] our pharmacist Minh who was able to send refills to last pt until his appt in October. Pt will call us if he does not receive his medication.  [place]

## 2017-08-16 ENCOUNTER — Telehealth: Payer: Self-pay | Admitting: Pharmacist Clinician (PhC)/ Clinical Pharmacy Specialist

## 2017-08-16 NOTE — Telephone Encounter (Signed)
After appeal PA denial, sent the appeal to [employer] appeal.

## 2017-08-17 NOTE — Telephone Encounter (Signed)
Patient called to check on status of the PA submitted for his Vemlidy. Advise him the pharmacist is working on it and resubmitted more information yeaterday 08/16/17 and as soon as we hear something we will give him a call.

## 2017-08-19 ENCOUNTER — Other Ambulatory Visit: Payer: Self-pay | Admitting: Pharmacist Clinician (PhC)/ Clinical Pharmacy Specialist

## 2017-08-19 MED ORDER — TENOFOVIR DISOPROXIL FUMARATE 300 MG PO TABS: 300.0000 mg | ORAL_TABLET | ORAL | 6 refills | Status: DC

## 2017-08-19 MED ORDER — ENTECAVIR 0.5 MG PO TABS: 0.5000 mg | ORAL_TABLET | Freq: Every day | ORAL | 5 refills | Status: DC

## 2017-08-19 NOTE — Telephone Encounter (Signed)
PT left a vm today regarding his message. Stated he had a missed call from this number and was wondering if we had any updates. Messaged pharmacist Onnie Boer who stated that insurance would not cover the [facility] and that an alternative medication would have to be called in. Juan Wiggins is waiting on a response for PA on alternative medication. [place]

## 2017-08-19 NOTE — Progress Notes (Signed)
Insurance denied generic tenofovir but will cover entecavir. Tried to call the treatment place to see about delivery but had to leave a message to call back.

## 2017-08-23 ENCOUNTER — Telehealth: Payer: Self-pay | Admitting: Pharmacist Clinician (PhC)/ Clinical Pharmacy Specialist

## 2017-08-23 NOTE — Telephone Encounter (Signed)
Left a VM for Juan Wiggins treatment center to let him knows that [facility] has his entecavir there for shipment. His tenofovir was denied along with his Vemlidy due to formulary exclusion. He needs to call us back to coordinate delivery.

## 2017-08-27 ENCOUNTER — Other Ambulatory Visit: Payer: Self-pay | Admitting: Pharmacist Clinician (PhC)/ Clinical Pharmacy Specialist

## 2017-08-27 ENCOUNTER — Telehealth: Payer: Self-pay

## 2017-08-27 MED ORDER — TENOFOVIR DISOPROXIL FUMARATE 300 MG PO TABS: 300.0000 mg | ORAL_TABLET | ORAL | 3 refills | Status: AC

## 2017-08-27 NOTE — Telephone Encounter (Signed)
Patient calling with medication questions.  He is upset his medications have not been sent to pharmacy.  Call transferred to pharmacy since [facility] has been working on his case.   Laverle Patter, RN

## 2017-08-27 NOTE — Progress Notes (Signed)
Juan Wiggins called today about his hep B meds. We are still having issue with all of his meds. He said that [name] needs a script for generic tenofovir because they said that it would cost him $14 dollars. Told him that since we are using TDF he has to take it every other day due to his renal function. Rx sent to [facility].

## 2017-11-24 ENCOUNTER — Ambulatory Visit (HOSPITAL_COMMUNITY): Payer: Self-pay | Admitting: Psychiatry

## 2017-11-26 ENCOUNTER — Other Ambulatory Visit: Payer: Self-pay | Admitting: Internal Medicine

## 2017-11-29 ENCOUNTER — Ambulatory Visit: Payer: Self-pay | Admitting: Internal Medicine

## 2017-11-30 ENCOUNTER — Ambulatory Visit: Payer: Self-pay | Admitting: Internal Medicine

## 2017-12-01 ENCOUNTER — Ambulatory Visit: Payer: Self-pay | Admitting: Internal Medicine

## 2017-12-24 DEATH — deceased

## 2018-01-05 ENCOUNTER — Ambulatory Visit: Payer: Self-pay | Admitting: Internal Medicine

## 2018-01-26 ENCOUNTER — Ambulatory Visit (HOSPITAL_COMMUNITY): Payer: Medicare Other | Admitting: Psychiatry
